# Patient Record
Sex: Male | Born: 1940
Health system: Southern US, Community
[De-identification: ages and names within clinical notes are randomized; demographics above are authoritative.]

## PROBLEM LIST (undated history)

## (undated) DIAGNOSIS — I482 Chronic atrial fibrillation, unspecified: Secondary | ICD-10-CM

## (undated) DIAGNOSIS — K5730 Diverticulosis of large intestine without perforation or abscess without bleeding: Secondary | ICD-10-CM

## (undated) DIAGNOSIS — G4733 Obstructive sleep apnea (adult) (pediatric): Secondary | ICD-10-CM

## (undated) DIAGNOSIS — K635 Polyp of colon: Secondary | ICD-10-CM

## (undated) DIAGNOSIS — G4719 Other hypersomnia: Secondary | ICD-10-CM

## (undated) DIAGNOSIS — I2699 Other pulmonary embolism without acute cor pulmonale: Secondary | ICD-10-CM

## (undated) DIAGNOSIS — E785 Hyperlipidemia, unspecified: Secondary | ICD-10-CM

## (undated) DIAGNOSIS — I472 Ventricular tachycardia, unspecified: Secondary | ICD-10-CM

## (undated) DIAGNOSIS — I340 Nonrheumatic mitral (valve) insufficiency: Secondary | ICD-10-CM

## (undated) DIAGNOSIS — I10 Essential (primary) hypertension: Secondary | ICD-10-CM

## (undated) DIAGNOSIS — N200 Calculus of kidney: Secondary | ICD-10-CM

## (undated) DIAGNOSIS — I509 Heart failure, unspecified: Secondary | ICD-10-CM

## (undated) DIAGNOSIS — I2510 Atherosclerotic heart disease of native coronary artery without angina pectoris: Secondary | ICD-10-CM

## (undated) HISTORY — DX: Chronic atrial fibrillation, unspecified: I48.20

## (undated) HISTORY — DX: Diverticulosis of large intestine without perforation or abscess without bleeding: K57.30

## (undated) HISTORY — DX: Atherosclerotic heart disease of native coronary artery without angina pectoris: I25.10

## (undated) HISTORY — PX: KNEE ARTHROPLASTY: SHX992

## (undated) HISTORY — DX: Polyp of colon: K63.5

## (undated) HISTORY — DX: Calculus of kidney: N20.0

## (undated) HISTORY — DX: Ventricular tachycardia, unspecified: I47.20

## (undated) HISTORY — PX: BACK SURGERY: SHX140

## (undated) HISTORY — DX: Nonrheumatic mitral (valve) insufficiency: I34.0

## (undated) HISTORY — DX: Hyperlipidemia, unspecified: E78.5

## (undated) HISTORY — DX: Other hypersomnia: G47.19

## (undated) HISTORY — PX: CHOLECYSTECTOMY: SHX55

## (undated) HISTORY — DX: Essential (primary) hypertension: I10

## (undated) HISTORY — DX: Obstructive sleep apnea (adult) (pediatric): G47.33

## (undated) HISTORY — DX: Ventricular tachycardia: I47.2

## (undated) HISTORY — PX: BASAL CELL CARCINOMA EXCISION: SHX1214

## (undated) HISTORY — DX: Heart failure, unspecified: I50.9

## (undated) HISTORY — PX: CARDIAC DEFIBRILLATOR PLACEMENT: SHX171

## (undated) HISTORY — DX: Other pulmonary embolism without acute cor pulmonale: I26.99

---

## 2000-06-20 ENCOUNTER — Encounter: Payer: Self-pay | Admitting: Emergency Medicine

## 2000-06-20 ENCOUNTER — Inpatient Hospital Stay (HOSPITAL_COMMUNITY): Admission: EM | Admit: 2000-06-20 | Discharge: 2000-06-28 | Payer: Self-pay | Admitting: Emergency Medicine

## 2000-06-22 ENCOUNTER — Encounter: Payer: Self-pay | Admitting: Cardiovascular Disease

## 2000-06-28 ENCOUNTER — Encounter: Payer: Self-pay | Admitting: Cardiology

## 2001-04-09 ENCOUNTER — Emergency Department (HOSPITAL_COMMUNITY): Admission: EM | Admit: 2001-04-09 | Discharge: 2001-04-09 | Payer: Self-pay | Admitting: Emergency Medicine

## 2001-05-19 ENCOUNTER — Emergency Department (HOSPITAL_COMMUNITY): Admission: EM | Admit: 2001-05-19 | Discharge: 2001-05-19 | Payer: Self-pay | Admitting: Emergency Medicine

## 2001-05-20 ENCOUNTER — Emergency Department (HOSPITAL_COMMUNITY): Admission: EM | Admit: 2001-05-20 | Discharge: 2001-05-20 | Payer: Self-pay | Admitting: Emergency Medicine

## 2002-02-05 ENCOUNTER — Emergency Department (HOSPITAL_COMMUNITY): Admission: EM | Admit: 2002-02-05 | Discharge: 2002-02-05 | Payer: Self-pay | Admitting: Emergency Medicine

## 2002-03-02 ENCOUNTER — Encounter: Payer: Self-pay | Admitting: Internal Medicine

## 2002-03-03 ENCOUNTER — Inpatient Hospital Stay (HOSPITAL_COMMUNITY): Admission: AD | Admit: 2002-03-03 | Discharge: 2002-03-07 | Payer: Self-pay | Admitting: Internal Medicine

## 2002-04-04 ENCOUNTER — Encounter: Payer: Self-pay | Admitting: Emergency Medicine

## 2002-04-04 ENCOUNTER — Emergency Department (HOSPITAL_COMMUNITY): Admission: EM | Admit: 2002-04-04 | Discharge: 2002-04-04 | Payer: Self-pay

## 2002-11-18 ENCOUNTER — Emergency Department (HOSPITAL_COMMUNITY): Admission: EM | Admit: 2002-11-18 | Discharge: 2002-11-19 | Payer: Self-pay

## 2003-03-28 ENCOUNTER — Ambulatory Visit (HOSPITAL_COMMUNITY): Admission: RE | Admit: 2003-03-28 | Discharge: 2003-03-28 | Payer: Self-pay | Admitting: Gastroenterology

## 2003-03-28 ENCOUNTER — Encounter (INDEPENDENT_AMBULATORY_CARE_PROVIDER_SITE_OTHER): Payer: Self-pay

## 2003-09-18 ENCOUNTER — Encounter: Payer: Self-pay | Admitting: Internal Medicine

## 2003-09-18 ENCOUNTER — Encounter: Admission: RE | Admit: 2003-09-18 | Discharge: 2003-09-18 | Payer: Self-pay | Admitting: Internal Medicine

## 2003-11-11 ENCOUNTER — Encounter: Payer: Self-pay | Admitting: Internal Medicine

## 2004-10-16 ENCOUNTER — Ambulatory Visit: Payer: Self-pay | Admitting: Cardiology

## 2004-11-03 ENCOUNTER — Ambulatory Visit: Payer: Self-pay | Admitting: Internal Medicine

## 2004-11-13 ENCOUNTER — Ambulatory Visit: Payer: Self-pay | Admitting: Internal Medicine

## 2004-11-13 ENCOUNTER — Ambulatory Visit: Payer: Self-pay | Admitting: Cardiology

## 2004-11-27 ENCOUNTER — Ambulatory Visit: Payer: Self-pay | Admitting: Cardiology

## 2004-12-25 ENCOUNTER — Ambulatory Visit: Payer: Self-pay | Admitting: Cardiovascular Disease

## 2005-01-22 ENCOUNTER — Ambulatory Visit: Payer: Self-pay | Admitting: Internal Medicine

## 2005-01-25 ENCOUNTER — Ambulatory Visit: Payer: Self-pay

## 2005-02-11 ENCOUNTER — Ambulatory Visit: Payer: Self-pay | Admitting: Internal Medicine

## 2005-03-01 ENCOUNTER — Ambulatory Visit: Payer: Self-pay | Admitting: Cardiology

## 2005-03-29 ENCOUNTER — Ambulatory Visit: Payer: Self-pay | Admitting: Cardiology

## 2005-04-22 ENCOUNTER — Ambulatory Visit: Payer: Self-pay | Admitting: Cardiology

## 2005-05-04 ENCOUNTER — Ambulatory Visit: Payer: Self-pay | Admitting: Internal Medicine

## 2005-05-11 ENCOUNTER — Ambulatory Visit: Payer: Self-pay | Admitting: Internal Medicine

## 2005-05-18 ENCOUNTER — Ambulatory Visit: Payer: Self-pay | Admitting: Internal Medicine

## 2005-05-20 ENCOUNTER — Ambulatory Visit: Payer: Self-pay | Admitting: Internal Medicine

## 2005-05-20 ENCOUNTER — Ambulatory Visit: Payer: Self-pay

## 2005-06-14 ENCOUNTER — Ambulatory Visit: Payer: Self-pay | Admitting: Internal Medicine

## 2005-06-14 ENCOUNTER — Encounter: Admission: RE | Admit: 2005-06-14 | Discharge: 2005-09-12 | Payer: Self-pay | Admitting: Internal Medicine

## 2005-06-16 ENCOUNTER — Ambulatory Visit: Payer: Self-pay | Admitting: Gastroenterology

## 2005-06-17 ENCOUNTER — Ambulatory Visit: Payer: Self-pay | Admitting: Internal Medicine

## 2005-06-21 ENCOUNTER — Ambulatory Visit: Payer: Self-pay | Admitting: Internal Medicine

## 2005-07-15 ENCOUNTER — Ambulatory Visit: Payer: Self-pay | Admitting: Internal Medicine

## 2005-07-19 ENCOUNTER — Ambulatory Visit: Payer: Self-pay | Admitting: Gastroenterology

## 2005-07-27 ENCOUNTER — Ambulatory Visit: Payer: Self-pay | Admitting: Gastroenterology

## 2005-07-27 ENCOUNTER — Encounter: Payer: Self-pay | Admitting: Internal Medicine

## 2005-08-16 ENCOUNTER — Ambulatory Visit: Payer: Self-pay | Admitting: Internal Medicine

## 2005-09-21 ENCOUNTER — Ambulatory Visit: Payer: Self-pay | Admitting: Internal Medicine

## 2005-10-11 ENCOUNTER — Ambulatory Visit: Payer: Self-pay | Admitting: Internal Medicine

## 2005-10-18 ENCOUNTER — Ambulatory Visit: Payer: Self-pay | Admitting: Internal Medicine

## 2005-10-25 ENCOUNTER — Ambulatory Visit: Payer: Self-pay | Admitting: Internal Medicine

## 2005-11-24 ENCOUNTER — Ambulatory Visit: Payer: Self-pay | Admitting: Internal Medicine

## 2005-12-27 ENCOUNTER — Ambulatory Visit: Payer: Self-pay | Admitting: Internal Medicine

## 2006-01-03 ENCOUNTER — Ambulatory Visit: Payer: Self-pay | Admitting: Internal Medicine

## 2006-01-18 ENCOUNTER — Ambulatory Visit: Payer: Self-pay | Admitting: Internal Medicine

## 2006-02-01 ENCOUNTER — Ambulatory Visit: Payer: Self-pay | Admitting: Internal Medicine

## 2006-02-10 ENCOUNTER — Ambulatory Visit: Payer: Self-pay | Admitting: Internal Medicine

## 2006-02-21 ENCOUNTER — Ambulatory Visit: Payer: Self-pay | Admitting: Internal Medicine

## 2006-03-01 ENCOUNTER — Ambulatory Visit: Payer: Self-pay | Admitting: Internal Medicine

## 2006-03-03 ENCOUNTER — Encounter: Payer: Self-pay | Admitting: Internal Medicine

## 2006-03-03 ENCOUNTER — Ambulatory Visit: Payer: Self-pay

## 2006-03-15 ENCOUNTER — Ambulatory Visit: Payer: Self-pay | Admitting: Internal Medicine

## 2006-03-21 ENCOUNTER — Ambulatory Visit: Payer: Self-pay | Admitting: Internal Medicine

## 2006-04-20 ENCOUNTER — Ambulatory Visit: Payer: Self-pay | Admitting: Internal Medicine

## 2006-05-05 ENCOUNTER — Ambulatory Visit: Payer: Self-pay | Admitting: Internal Medicine

## 2006-06-06 ENCOUNTER — Ambulatory Visit: Payer: Self-pay | Admitting: Internal Medicine

## 2006-06-14 ENCOUNTER — Ambulatory Visit: Payer: Self-pay | Admitting: Internal Medicine

## 2006-06-16 ENCOUNTER — Ambulatory Visit: Payer: Self-pay | Admitting: Internal Medicine

## 2006-06-22 ENCOUNTER — Ambulatory Visit: Payer: Self-pay | Admitting: Internal Medicine

## 2006-07-07 ENCOUNTER — Ambulatory Visit: Payer: Self-pay | Admitting: Internal Medicine

## 2006-08-11 ENCOUNTER — Ambulatory Visit: Payer: Self-pay | Admitting: Internal Medicine

## 2006-09-12 ENCOUNTER — Ambulatory Visit: Payer: Self-pay | Admitting: Internal Medicine

## 2006-09-26 ENCOUNTER — Ambulatory Visit: Payer: Self-pay

## 2006-10-10 ENCOUNTER — Ambulatory Visit: Payer: Self-pay | Admitting: Internal Medicine

## 2006-10-10 LAB — CONVERTED CEMR LAB
ALT: 21 units/L (ref 0–40)
AST: 25 units/L (ref 0–37)
Albumin: 3.7 g/dL (ref 3.5–5.2)
Alkaline Phosphatase: 71 units/L (ref 39–117)
BUN: 21 mg/dL (ref 6–23)
Bilirubin, Direct: 0.2 mg/dL (ref 0.0–0.3)
CO2: 30 meq/L (ref 19–32)
Calcium: 9.3 mg/dL (ref 8.4–10.5)
Chloride: 105 meq/L (ref 96–112)
Chol/HDL Ratio, serum: 4.4
Cholesterol: 126 mg/dL (ref 0–200)
Creatinine, Ser: 1 mg/dL (ref 0.4–1.5)
GFR calc non Af Amer: 80 mL/min
Glomerular Filtration Rate, Af Am: 97 mL/min/{1.73_m2}
Glucose, Bld: 98 mg/dL (ref 70–99)
HDL: 28.6 mg/dL — ABNORMAL LOW (ref >39.0)
Hgb A1c MFr Bld: 5.7 % (ref 4.6–6.0)
LDL Cholesterol: 87 mg/dL (ref 0–99)
Potassium: 4.3 meq/L (ref 3.5–5.1)
Sodium: 141 meq/L (ref 135–145)
Total Bilirubin: 0.7 mg/dL (ref 0.3–1.2)
Total Protein: 6.3 g/dL (ref 6.0–8.3)
Triglyceride fasting, serum: 53 mg/dL (ref 0–149)
VLDL: 11 mg/dL (ref 0–40)

## 2006-10-16 DIAGNOSIS — I252 Old myocardial infarction: Secondary | ICD-10-CM | POA: Insufficient documentation

## 2006-10-16 DIAGNOSIS — Z860100 Personal history of colon polyps, unspecified: Secondary | ICD-10-CM | POA: Insufficient documentation

## 2006-10-16 DIAGNOSIS — E785 Hyperlipidemia, unspecified: Secondary | ICD-10-CM

## 2006-10-16 DIAGNOSIS — Z87442 Personal history of urinary calculi: Secondary | ICD-10-CM | POA: Insufficient documentation

## 2006-10-16 DIAGNOSIS — E1169 Type 2 diabetes mellitus with other specified complication: Secondary | ICD-10-CM | POA: Insufficient documentation

## 2006-10-16 DIAGNOSIS — I152 Hypertension secondary to endocrine disorders: Secondary | ICD-10-CM | POA: Insufficient documentation

## 2006-10-16 DIAGNOSIS — I10 Essential (primary) hypertension: Secondary | ICD-10-CM | POA: Insufficient documentation

## 2006-10-16 DIAGNOSIS — I2782 Chronic pulmonary embolism: Secondary | ICD-10-CM | POA: Insufficient documentation

## 2006-10-16 DIAGNOSIS — I2510 Atherosclerotic heart disease of native coronary artery without angina pectoris: Secondary | ICD-10-CM | POA: Insufficient documentation

## 2006-10-16 DIAGNOSIS — K5730 Diverticulosis of large intestine without perforation or abscess without bleeding: Secondary | ICD-10-CM

## 2006-10-16 DIAGNOSIS — E119 Type 2 diabetes mellitus without complications: Secondary | ICD-10-CM | POA: Insufficient documentation

## 2006-10-16 HISTORY — DX: Diverticulosis of large intestine without perforation or abscess without bleeding: K57.30

## 2006-10-17 ENCOUNTER — Ambulatory Visit: Payer: Self-pay | Admitting: Internal Medicine

## 2006-10-17 LAB — CONVERTED CEMR LAB
Cholesterol, target level: 200 mg/dL
HDL goal, serum: 40 mg/dL
LDL Goal: 70 mg/dL

## 2006-11-16 ENCOUNTER — Ambulatory Visit: Payer: Self-pay | Admitting: Internal Medicine

## 2006-12-20 ENCOUNTER — Ambulatory Visit: Payer: Self-pay | Admitting: Internal Medicine

## 2007-01-09 ENCOUNTER — Ambulatory Visit: Payer: Self-pay | Admitting: Internal Medicine

## 2007-01-20 ENCOUNTER — Ambulatory Visit: Payer: Self-pay | Admitting: Internal Medicine

## 2007-01-26 ENCOUNTER — Ambulatory Visit: Payer: Self-pay | Admitting: Internal Medicine

## 2007-01-27 ENCOUNTER — Ambulatory Visit: Payer: Self-pay | Admitting: Internal Medicine

## 2007-02-06 ENCOUNTER — Ambulatory Visit: Payer: Self-pay | Admitting: Internal Medicine

## 2007-02-06 LAB — CONVERTED CEMR LAB
ALT: 19 units/L (ref 0–40)
AST: 24 units/L (ref 0–37)
Albumin: 3.6 g/dL (ref 3.5–5.2)
Alkaline Phosphatase: 63 units/L (ref 39–117)
BUN: 16 mg/dL (ref 6–23)
Bilirubin, Direct: 0.1 mg/dL (ref 0.0–0.3)
CO2: 32 meq/L (ref 19–32)
Calcium: 9.3 mg/dL (ref 8.4–10.5)
Chloride: 108 meq/L (ref 96–112)
Cholesterol: 122 mg/dL (ref 0–200)
Creatinine, Ser: 0.9 mg/dL (ref 0.4–1.5)
GFR calc Af Amer: 110 mL/min
GFR calc non Af Amer: 91 mL/min
Glucose, Bld: 99 mg/dL (ref 70–99)
HDL: 34.1 mg/dL — ABNORMAL LOW (ref >39.0)
Hgb A1c MFr Bld: 6 % (ref 4.6–6.0)
LDL Cholesterol: 74 mg/dL (ref 0–99)
PSA: 1.41 ng/mL (ref 0.10–4.00)
Potassium: 5.1 meq/L (ref 3.5–5.1)
Sodium: 144 meq/L (ref 135–145)
Total Bilirubin: 0.7 mg/dL (ref 0.3–1.2)
Total CHOL/HDL Ratio: 3.6
Total Protein: 6.6 g/dL (ref 6.0–8.3)
Triglycerides: 72 mg/dL (ref 0–149)
VLDL: 14 mg/dL (ref 0–40)

## 2007-02-13 ENCOUNTER — Ambulatory Visit: Payer: Self-pay | Admitting: Internal Medicine

## 2007-02-22 ENCOUNTER — Ambulatory Visit: Payer: Self-pay | Admitting: Internal Medicine

## 2007-03-24 ENCOUNTER — Ambulatory Visit: Payer: Self-pay | Admitting: Internal Medicine

## 2007-04-24 ENCOUNTER — Ambulatory Visit: Payer: Self-pay | Admitting: Internal Medicine

## 2007-05-09 ENCOUNTER — Ambulatory Visit: Payer: Self-pay | Admitting: Internal Medicine

## 2007-05-22 ENCOUNTER — Ambulatory Visit: Payer: Self-pay | Admitting: Internal Medicine

## 2007-06-19 ENCOUNTER — Ambulatory Visit: Payer: Self-pay | Admitting: Internal Medicine

## 2007-06-19 LAB — CONVERTED CEMR LAB
ALT: 20 units/L (ref 0–53)
AST: 22 units/L (ref 0–37)
Albumin: 3.8 g/dL (ref 3.5–5.2)
Alkaline Phosphatase: 57 units/L (ref 39–117)
BUN: 19 mg/dL (ref 6–23)
Bilirubin, Direct: 0.1 mg/dL (ref 0.0–0.3)
CO2: 29 meq/L (ref 19–32)
Calcium: 9.1 mg/dL (ref 8.4–10.5)
Chloride: 105 meq/L (ref 96–112)
Cholesterol: 112 mg/dL (ref 0–200)
Creatinine, Ser: 0.8 mg/dL (ref 0.4–1.5)
Digitoxin Lvl: 0.4 ng/mL — ABNORMAL LOW (ref 0.8–2.0)
GFR calc Af Amer: 126 mL/min
GFR calc non Af Amer: 104 mL/min
Glucose, Bld: 91 mg/dL (ref 70–99)
HDL: 31.6 mg/dL — ABNORMAL LOW (ref >39.0)
Hgb A1c MFr Bld: 6.1 % — ABNORMAL HIGH (ref 4.6–6.0)
LDL Cholesterol: 69 mg/dL (ref 0–99)
Potassium: 4.5 meq/L (ref 3.5–5.1)
Sodium: 141 meq/L (ref 135–145)
Total Bilirubin: 1 mg/dL (ref 0.3–1.2)
Total CHOL/HDL Ratio: 3.5
Total Protein: 6.7 g/dL (ref 6.0–8.3)
Triglycerides: 56 mg/dL (ref 0–149)
VLDL: 11 mg/dL (ref 0–40)

## 2007-07-05 ENCOUNTER — Ambulatory Visit: Payer: Self-pay | Admitting: Internal Medicine

## 2007-07-05 DIAGNOSIS — Z85828 Personal history of other malignant neoplasm of skin: Secondary | ICD-10-CM | POA: Insufficient documentation

## 2007-07-17 ENCOUNTER — Telehealth: Payer: Self-pay | Admitting: Internal Medicine

## 2007-07-27 ENCOUNTER — Ambulatory Visit: Payer: Self-pay | Admitting: Internal Medicine

## 2007-07-27 LAB — CONVERTED CEMR LAB
INR: 3
Prothrombin Time: 20.9 s

## 2007-08-25 ENCOUNTER — Ambulatory Visit: Payer: Self-pay | Admitting: Internal Medicine

## 2007-08-25 LAB — CONVERTED CEMR LAB
INR: 2
Prothrombin Time: 17.5 s

## 2007-08-29 ENCOUNTER — Ambulatory Visit: Payer: Self-pay | Admitting: Internal Medicine

## 2007-09-22 ENCOUNTER — Telehealth: Payer: Self-pay | Admitting: Internal Medicine

## 2007-09-25 ENCOUNTER — Ambulatory Visit: Payer: Self-pay | Admitting: Internal Medicine

## 2007-10-25 ENCOUNTER — Ambulatory Visit: Payer: Self-pay | Admitting: Internal Medicine

## 2007-10-25 LAB — CONVERTED CEMR LAB
ALT: 30 units/L (ref 0–53)
AST: 25 units/L (ref 0–37)
Albumin: 3.7 g/dL (ref 3.5–5.2)
Alkaline Phosphatase: 72 units/L (ref 39–117)
Bilirubin, Direct: 0.2 mg/dL (ref 0.0–0.3)
Cholesterol: 113 mg/dL (ref 0–200)
HDL: 26.2 mg/dL — ABNORMAL LOW (ref >39.0)
Hgb A1c MFr Bld: 6.1 % — ABNORMAL HIGH (ref 4.6–6.0)
LDL Cholesterol: 78 mg/dL (ref 0–99)
Total Bilirubin: 0.8 mg/dL (ref 0.3–1.2)
Total CHOL/HDL Ratio: 4.3
Total Protein: 6.6 g/dL (ref 6.0–8.3)
Triglycerides: 44 mg/dL (ref 0–149)
VLDL: 9 mg/dL (ref 0–40)

## 2007-11-01 ENCOUNTER — Ambulatory Visit: Payer: Self-pay | Admitting: Internal Medicine

## 2007-11-08 ENCOUNTER — Ambulatory Visit: Payer: Self-pay

## 2007-11-22 ENCOUNTER — Ambulatory Visit: Payer: Self-pay | Admitting: Internal Medicine

## 2007-11-22 LAB — CONVERTED CEMR LAB
INR: 3.5
Prothrombin Time: 22.6 s

## 2007-12-20 ENCOUNTER — Ambulatory Visit: Payer: Self-pay | Admitting: Internal Medicine

## 2007-12-20 LAB — CONVERTED CEMR LAB: INR: 2.8

## 2008-01-22 ENCOUNTER — Ambulatory Visit: Payer: Self-pay | Admitting: Internal Medicine

## 2008-01-22 LAB — CONVERTED CEMR LAB
INR: 4.1
Prothrombin Time: 24.6 s

## 2008-02-06 ENCOUNTER — Ambulatory Visit: Payer: Self-pay | Admitting: Internal Medicine

## 2008-02-08 ENCOUNTER — Telehealth: Payer: Self-pay | Admitting: Internal Medicine

## 2008-02-13 ENCOUNTER — Ambulatory Visit: Payer: Self-pay | Admitting: Internal Medicine

## 2008-02-20 ENCOUNTER — Ambulatory Visit: Payer: Self-pay | Admitting: Internal Medicine

## 2008-02-20 LAB — CONVERTED CEMR LAB
ALT: 23 units/L (ref 0–53)
Alkaline Phosphatase: 61 units/L (ref 39–117)
BUN: 16 mg/dL (ref 6–23)
CO2: 31 meq/L (ref 19–32)
Calcium: 9 mg/dL (ref 8.4–10.5)
Creatinine, Ser: 0.9 mg/dL (ref 0.4–1.5)
GFR calc Af Amer: 109 mL/min
Total Bilirubin: 1 mg/dL (ref 0.3–1.2)
Total Protein: 6.1 g/dL (ref 6.0–8.3)

## 2008-02-27 ENCOUNTER — Ambulatory Visit: Payer: Self-pay | Admitting: Internal Medicine

## 2008-03-08 ENCOUNTER — Ambulatory Visit: Payer: Self-pay | Admitting: Internal Medicine

## 2008-03-08 LAB — CONVERTED CEMR LAB
INR: 1.6
Prothrombin Time: 15.7 s

## 2008-03-26 ENCOUNTER — Ambulatory Visit: Payer: Self-pay | Admitting: Internal Medicine

## 2008-04-05 ENCOUNTER — Ambulatory Visit: Payer: Self-pay | Admitting: Internal Medicine

## 2008-04-05 LAB — CONVERTED CEMR LAB
INR: 1.9
Prothrombin Time: 17.1 s

## 2008-04-19 ENCOUNTER — Ambulatory Visit: Payer: Self-pay | Admitting: Internal Medicine

## 2008-04-19 ENCOUNTER — Encounter: Payer: Self-pay | Admitting: Internal Medicine

## 2008-04-26 ENCOUNTER — Ambulatory Visit: Payer: Self-pay | Admitting: Internal Medicine

## 2008-04-26 ENCOUNTER — Encounter: Payer: Self-pay | Admitting: Internal Medicine

## 2008-05-03 ENCOUNTER — Ambulatory Visit: Payer: Self-pay | Admitting: Internal Medicine

## 2008-05-15 ENCOUNTER — Ambulatory Visit: Payer: Self-pay

## 2008-06-03 ENCOUNTER — Ambulatory Visit: Payer: Self-pay | Admitting: Internal Medicine

## 2008-06-03 LAB — CONVERTED CEMR LAB: Prothrombin Time: 15.9 s

## 2008-06-18 ENCOUNTER — Ambulatory Visit: Payer: Self-pay | Admitting: Internal Medicine

## 2008-06-19 ENCOUNTER — Encounter: Payer: Self-pay | Admitting: Internal Medicine

## 2008-06-20 ENCOUNTER — Telehealth (INDEPENDENT_AMBULATORY_CARE_PROVIDER_SITE_OTHER): Payer: Self-pay | Admitting: *Deleted

## 2008-06-21 ENCOUNTER — Ambulatory Visit: Payer: Self-pay | Admitting: Internal Medicine

## 2008-06-21 LAB — CONVERTED CEMR LAB
BUN: 19 mg/dL (ref 6–23)
Creatinine, Ser: 1 mg/dL (ref 0.4–1.5)

## 2008-06-24 ENCOUNTER — Ambulatory Visit: Payer: Self-pay | Admitting: Cardiovascular Disease

## 2008-06-26 DIAGNOSIS — N200 Calculus of kidney: Secondary | ICD-10-CM | POA: Insufficient documentation

## 2008-06-26 HISTORY — DX: Calculus of kidney: N20.0

## 2008-07-01 ENCOUNTER — Ambulatory Visit: Payer: Self-pay | Admitting: Internal Medicine

## 2008-07-01 LAB — CONVERTED CEMR LAB
INR: 1.1
Prothrombin Time: 13.1 s

## 2008-07-02 ENCOUNTER — Ambulatory Visit: Payer: Self-pay

## 2008-07-11 ENCOUNTER — Ambulatory Visit: Payer: Self-pay | Admitting: Internal Medicine

## 2008-07-17 ENCOUNTER — Ambulatory Visit: Payer: Self-pay | Admitting: Internal Medicine

## 2008-07-17 LAB — CONVERTED CEMR LAB
Basophils Absolute: 0 10*3/uL (ref 0.0–0.1)
Basophils Relative: 0.5 % (ref 0.0–3.0)
Chloride: 106 meq/L (ref 96–112)
Creatinine, Ser: 0.9 mg/dL (ref 0.4–1.5)
Eosinophils Absolute: 0.2 10*3/uL (ref 0.0–0.7)
GFR calc non Af Amer: 90 mL/min
MCHC: 35.6 g/dL (ref 30.0–36.0)
MCV: 99.9 fL (ref 78.0–100.0)
Neutrophils Relative %: 66.6 % (ref 43.0–77.0)
Platelets: 159 10*3/uL (ref 150–400)
RBC: 4.38 M/uL (ref 4.22–5.81)
aPTT: 30.6 s — ABNORMAL HIGH (ref 21.7–29.8)

## 2008-07-22 ENCOUNTER — Ambulatory Visit: Payer: Self-pay | Admitting: Internal Medicine

## 2008-07-22 ENCOUNTER — Ambulatory Visit (HOSPITAL_COMMUNITY): Admission: RE | Admit: 2008-07-22 | Discharge: 2008-07-22 | Payer: Self-pay | Admitting: Internal Medicine

## 2008-07-23 ENCOUNTER — Telehealth: Payer: Self-pay | Admitting: Internal Medicine

## 2008-08-05 ENCOUNTER — Ambulatory Visit: Payer: Self-pay | Admitting: Internal Medicine

## 2008-08-06 ENCOUNTER — Encounter: Payer: Self-pay | Admitting: Internal Medicine

## 2008-08-07 ENCOUNTER — Ambulatory Visit: Payer: Self-pay

## 2008-08-19 ENCOUNTER — Ambulatory Visit: Payer: Self-pay | Admitting: Internal Medicine

## 2008-08-19 LAB — CONVERTED CEMR LAB
ALT: 26 units/L (ref 0–53)
AST: 23 units/L (ref 0–37)
Alkaline Phosphatase: 72 units/L (ref 39–117)
Bilirubin, Direct: 0.1 mg/dL (ref 0.0–0.3)
CO2: 34 meq/L — ABNORMAL HIGH (ref 19–32)
Calcium: 9.2 mg/dL (ref 8.4–10.5)
Chloride: 107 meq/L (ref 96–112)
Glucose, Bld: 100 mg/dL — ABNORMAL HIGH (ref 70–99)
HDL: 32.9 mg/dL — ABNORMAL LOW (ref >39.0)
Potassium: 4.7 meq/L (ref 3.5–5.1)
Sodium: 145 meq/L (ref 135–145)
Total Bilirubin: 0.7 mg/dL (ref 0.3–1.2)
Total CHOL/HDL Ratio: 3.3
Total Protein: 7 g/dL (ref 6.0–8.3)

## 2008-08-23 ENCOUNTER — Ambulatory Visit: Payer: Self-pay | Admitting: Internal Medicine

## 2008-08-26 ENCOUNTER — Encounter: Payer: Self-pay | Admitting: Internal Medicine

## 2008-09-16 ENCOUNTER — Ambulatory Visit: Payer: Self-pay | Admitting: Internal Medicine

## 2008-10-18 ENCOUNTER — Ambulatory Visit: Payer: Self-pay | Admitting: Internal Medicine

## 2008-10-18 LAB — CONVERTED CEMR LAB: INR: 2.3

## 2008-10-28 ENCOUNTER — Telehealth: Payer: Self-pay | Admitting: Internal Medicine

## 2008-11-12 ENCOUNTER — Ambulatory Visit: Payer: Self-pay | Admitting: Internal Medicine

## 2008-11-18 ENCOUNTER — Ambulatory Visit: Payer: Self-pay | Admitting: Internal Medicine

## 2008-11-18 LAB — CONVERTED CEMR LAB
INR: 2.5
Prothrombin Time: 19.2 s

## 2008-12-10 ENCOUNTER — Ambulatory Visit: Payer: Self-pay | Admitting: Internal Medicine

## 2008-12-10 ENCOUNTER — Telehealth: Payer: Self-pay | Admitting: Internal Medicine

## 2008-12-10 LAB — CONVERTED CEMR LAB
AST: 27 units/L (ref 0–37)
Albumin: 3.7 g/dL (ref 3.5–5.2)
Alkaline Phosphatase: 64 units/L (ref 39–117)
BUN: 18 mg/dL (ref 6–23)
Bilirubin, Direct: 0.2 mg/dL (ref 0.0–0.3)
Chloride: 107 meq/L (ref 96–112)
Cholesterol: 108 mg/dL (ref 0–200)
GFR calc Af Amer: 109 mL/min
GFR calc non Af Amer: 90 mL/min
Glucose, Bld: 129 mg/dL — ABNORMAL HIGH (ref 70–99)
INR: 2.4
Potassium: 5 meq/L (ref 3.5–5.1)
Sodium: 146 meq/L — ABNORMAL HIGH (ref 135–145)
Total Protein: 6.8 g/dL (ref 6.0–8.3)
VLDL: 9 mg/dL (ref 0–40)

## 2008-12-17 ENCOUNTER — Ambulatory Visit: Payer: Self-pay | Admitting: Internal Medicine

## 2008-12-17 DIAGNOSIS — N320 Bladder-neck obstruction: Secondary | ICD-10-CM | POA: Insufficient documentation

## 2009-01-07 ENCOUNTER — Encounter: Payer: Self-pay | Admitting: Internal Medicine

## 2009-01-07 ENCOUNTER — Ambulatory Visit: Payer: Self-pay | Admitting: Internal Medicine

## 2009-01-07 LAB — CONVERTED CEMR LAB
INR: 3.3
Prothrombin Time: 21.8 s

## 2009-02-04 ENCOUNTER — Ambulatory Visit: Payer: Self-pay | Admitting: Internal Medicine

## 2009-02-04 LAB — CONVERTED CEMR LAB: INR: 3.4

## 2009-02-10 ENCOUNTER — Ambulatory Visit: Payer: Self-pay

## 2009-02-26 ENCOUNTER — Telehealth: Payer: Self-pay | Admitting: Internal Medicine

## 2009-03-04 ENCOUNTER — Ambulatory Visit: Payer: Self-pay | Admitting: Internal Medicine

## 2009-03-04 LAB — CONVERTED CEMR LAB
INR: 2.2
Prothrombin Time: 18.3 s

## 2009-03-31 ENCOUNTER — Ambulatory Visit: Payer: Self-pay | Admitting: Internal Medicine

## 2009-04-09 ENCOUNTER — Ambulatory Visit: Payer: Self-pay | Admitting: Internal Medicine

## 2009-04-09 LAB — CONVERTED CEMR LAB
Alkaline Phosphatase: 71 units/L (ref 39–117)
BUN: 18 mg/dL (ref 6–23)
Bilirubin, Direct: 0.1 mg/dL (ref 0.0–0.3)
CO2: 30 meq/L (ref 19–32)
Chloride: 111 meq/L (ref 96–112)
Creatinine, Ser: 0.8 mg/dL (ref 0.4–1.5)
Glucose, Bld: 102 mg/dL — ABNORMAL HIGH (ref 70–99)
LDL Cholesterol: 76 mg/dL (ref 0–99)
Potassium: 5 meq/L (ref 3.5–5.1)
Total Bilirubin: 1 mg/dL (ref 0.3–1.2)
VLDL: 10.4 mg/dL (ref 0.0–40.0)

## 2009-04-16 ENCOUNTER — Ambulatory Visit: Payer: Self-pay | Admitting: Internal Medicine

## 2009-05-01 ENCOUNTER — Ambulatory Visit: Payer: Self-pay | Admitting: Internal Medicine

## 2009-05-01 LAB — CONVERTED CEMR LAB: INR: 1.9

## 2009-05-08 ENCOUNTER — Encounter: Payer: Self-pay | Admitting: Internal Medicine

## 2009-05-08 ENCOUNTER — Ambulatory Visit: Payer: Self-pay

## 2009-05-15 ENCOUNTER — Ambulatory Visit: Payer: Self-pay | Admitting: Internal Medicine

## 2009-06-02 ENCOUNTER — Telehealth: Payer: Self-pay | Admitting: Internal Medicine

## 2009-06-03 ENCOUNTER — Ambulatory Visit: Payer: Self-pay | Admitting: Internal Medicine

## 2009-06-12 ENCOUNTER — Ambulatory Visit: Payer: Self-pay | Admitting: Internal Medicine

## 2009-06-12 LAB — CONVERTED CEMR LAB: INR: 2.6

## 2009-06-24 ENCOUNTER — Telehealth: Payer: Self-pay | Admitting: Internal Medicine

## 2009-07-03 ENCOUNTER — Telehealth (INDEPENDENT_AMBULATORY_CARE_PROVIDER_SITE_OTHER): Payer: Self-pay | Admitting: *Deleted

## 2009-07-14 ENCOUNTER — Ambulatory Visit: Payer: Self-pay | Admitting: Internal Medicine

## 2009-07-14 LAB — CONVERTED CEMR LAB: Prothrombin Time: 20.5 s

## 2009-08-07 ENCOUNTER — Encounter: Payer: Self-pay | Admitting: Internal Medicine

## 2009-08-07 ENCOUNTER — Ambulatory Visit: Payer: Self-pay

## 2009-08-13 ENCOUNTER — Ambulatory Visit: Payer: Self-pay | Admitting: Internal Medicine

## 2009-08-13 LAB — CONVERTED CEMR LAB
ALT: 24 units/L (ref 0–53)
AST: 24 units/L (ref 0–37)
BUN: 13 mg/dL (ref 6–23)
Bilirubin, Direct: 0.1 mg/dL (ref 0.0–0.3)
Calcium: 9 mg/dL (ref 8.4–10.5)
GFR calc non Af Amer: 71.2 mL/min (ref >60)
Glucose, Bld: 98 mg/dL (ref 70–99)
HDL: 30.4 mg/dL — ABNORMAL LOW (ref >39.00)
INR: 2.6
Prothrombin Time: 19.6 s
Total Bilirubin: 1.3 mg/dL — ABNORMAL HIGH (ref 0.3–1.2)

## 2009-08-20 ENCOUNTER — Emergency Department (HOSPITAL_COMMUNITY): Admission: EM | Admit: 2009-08-20 | Discharge: 2009-08-20 | Payer: Self-pay | Admitting: Emergency Medicine

## 2009-08-20 ENCOUNTER — Ambulatory Visit: Payer: Self-pay | Admitting: Internal Medicine

## 2009-08-22 ENCOUNTER — Telehealth: Payer: Self-pay | Admitting: Internal Medicine

## 2009-08-28 ENCOUNTER — Encounter: Payer: Self-pay | Admitting: Internal Medicine

## 2009-08-28 ENCOUNTER — Ambulatory Visit: Payer: Self-pay

## 2009-09-10 ENCOUNTER — Ambulatory Visit: Payer: Self-pay | Admitting: Internal Medicine

## 2009-09-23 ENCOUNTER — Ambulatory Visit: Payer: Self-pay | Admitting: Internal Medicine

## 2009-09-23 DIAGNOSIS — I714 Abdominal aortic aneurysm, without rupture, unspecified: Secondary | ICD-10-CM | POA: Insufficient documentation

## 2009-09-25 ENCOUNTER — Telehealth (INDEPENDENT_AMBULATORY_CARE_PROVIDER_SITE_OTHER): Payer: Self-pay | Admitting: *Deleted

## 2009-09-29 ENCOUNTER — Encounter (HOSPITAL_COMMUNITY): Admission: RE | Admit: 2009-09-29 | Discharge: 2009-12-04 | Payer: Self-pay | Admitting: Internal Medicine

## 2009-09-29 ENCOUNTER — Ambulatory Visit: Payer: Self-pay | Admitting: Cardiology

## 2009-09-29 ENCOUNTER — Ambulatory Visit: Payer: Self-pay

## 2009-10-08 ENCOUNTER — Ambulatory Visit: Payer: Self-pay | Admitting: Internal Medicine

## 2009-10-08 LAB — CONVERTED CEMR LAB: INR: 2

## 2009-10-24 ENCOUNTER — Telehealth: Payer: Self-pay | Admitting: Internal Medicine

## 2009-10-27 ENCOUNTER — Encounter: Payer: Self-pay | Admitting: Internal Medicine

## 2009-11-04 ENCOUNTER — Telehealth: Payer: Self-pay | Admitting: Internal Medicine

## 2009-11-04 LAB — CONVERTED CEMR LAB
CO2: 30 meq/L (ref 19–32)
Calcium: 9.3 mg/dL (ref 8.4–10.5)
Creatinine, Ser: 1 mg/dL (ref 0.4–1.5)
Glucose, Bld: 94 mg/dL (ref 70–99)

## 2009-11-07 ENCOUNTER — Ambulatory Visit: Payer: Self-pay | Admitting: Internal Medicine

## 2009-12-09 ENCOUNTER — Ambulatory Visit: Payer: Self-pay | Admitting: Internal Medicine

## 2009-12-12 ENCOUNTER — Ambulatory Visit: Payer: Self-pay | Admitting: Internal Medicine

## 2009-12-12 LAB — CONVERTED CEMR LAB
ALT: 25 units/L (ref 0–53)
AST: 24 units/L (ref 0–37)
Alkaline Phosphatase: 63 units/L (ref 39–117)
Calcium: 9.1 mg/dL (ref 8.4–10.5)
Creatinine, Ser: 0.9 mg/dL (ref 0.4–1.5)
GFR calc non Af Amer: 89.66 mL/min (ref >60)
Glucose, Bld: 107 mg/dL — ABNORMAL HIGH (ref 70–99)
HDL: 31.1 mg/dL — ABNORMAL LOW (ref >39.00)
INR: 1.8
Prothrombin Time: 16.4 s
Sodium: 142 meq/L (ref 135–145)
Total Bilirubin: 1 mg/dL (ref 0.3–1.2)
Triglycerides: 43 mg/dL (ref 0.0–149.0)

## 2009-12-19 ENCOUNTER — Ambulatory Visit: Payer: Self-pay | Admitting: Internal Medicine

## 2010-01-13 ENCOUNTER — Ambulatory Visit: Payer: Self-pay | Admitting: Internal Medicine

## 2010-01-13 LAB — CONVERTED CEMR LAB: INR: 2.3

## 2010-01-14 ENCOUNTER — Telehealth: Payer: Self-pay | Admitting: Internal Medicine

## 2010-02-10 ENCOUNTER — Ambulatory Visit: Payer: Self-pay | Admitting: Internal Medicine

## 2010-03-10 ENCOUNTER — Ambulatory Visit: Payer: Self-pay | Admitting: Internal Medicine

## 2010-03-10 DIAGNOSIS — C44201 Unspecified malignant neoplasm of skin of unspecified ear and external auricular canal: Secondary | ICD-10-CM | POA: Insufficient documentation

## 2010-03-10 LAB — CONVERTED CEMR LAB: INR: 2.7

## 2010-03-11 ENCOUNTER — Encounter: Payer: Self-pay | Admitting: Internal Medicine

## 2010-03-11 ENCOUNTER — Ambulatory Visit: Payer: Self-pay

## 2010-04-06 ENCOUNTER — Ambulatory Visit: Payer: Self-pay | Admitting: Internal Medicine

## 2010-04-06 LAB — CONVERTED CEMR LAB
INR: 2.3
Prothrombin Time: 18.7 s

## 2010-05-07 ENCOUNTER — Ambulatory Visit: Payer: Self-pay | Admitting: Internal Medicine

## 2010-05-07 LAB — CONVERTED CEMR LAB: INR: 2.7

## 2010-06-09 ENCOUNTER — Ambulatory Visit: Payer: Self-pay | Admitting: Internal Medicine

## 2010-06-09 LAB — CONVERTED CEMR LAB
ALT: 33 units/L (ref 0–53)
AST: 29 units/L (ref 0–37)
Albumin: 4 g/dL (ref 3.5–5.2)
Alkaline Phosphatase: 70 units/L (ref 39–117)
Bilirubin, Direct: 0.2 mg/dL (ref 0.0–0.3)
CO2: 33 meq/L — ABNORMAL HIGH (ref 19–32)
Chloride: 105 meq/L (ref 96–112)
Glucose, Bld: 101 mg/dL — ABNORMAL HIGH (ref 70–99)
HDL: 30.4 mg/dL — ABNORMAL LOW (ref >39.00)
INR: 1.9
Potassium: 4.5 meq/L (ref 3.5–5.1)
Sodium: 141 meq/L (ref 135–145)
Total CHOL/HDL Ratio: 4
Total Protein: 6.8 g/dL (ref 6.0–8.3)

## 2010-06-10 ENCOUNTER — Ambulatory Visit: Payer: Self-pay | Admitting: Internal Medicine

## 2010-06-16 ENCOUNTER — Ambulatory Visit: Payer: Self-pay | Admitting: Internal Medicine

## 2010-07-09 ENCOUNTER — Ambulatory Visit: Payer: Self-pay | Admitting: Internal Medicine

## 2010-08-07 ENCOUNTER — Ambulatory Visit: Payer: Self-pay | Admitting: Internal Medicine

## 2010-09-04 ENCOUNTER — Ambulatory Visit: Payer: Self-pay | Admitting: Internal Medicine

## 2010-09-09 ENCOUNTER — Ambulatory Visit: Payer: Self-pay

## 2010-09-09 ENCOUNTER — Encounter: Payer: Self-pay | Admitting: Internal Medicine

## 2010-10-02 ENCOUNTER — Ambulatory Visit: Payer: Self-pay | Admitting: Internal Medicine

## 2010-10-28 ENCOUNTER — Ambulatory Visit: Payer: Self-pay | Admitting: Internal Medicine

## 2010-10-28 LAB — CONVERTED CEMR LAB: INR: 2.2

## 2010-11-25 ENCOUNTER — Ambulatory Visit: Payer: Self-pay | Admitting: Internal Medicine

## 2010-11-25 LAB — CONVERTED CEMR LAB: INR: 2.3

## 2010-12-08 ENCOUNTER — Ambulatory Visit
Admission: RE | Admit: 2010-12-08 | Discharge: 2010-12-08 | Payer: Self-pay | Source: Home / Self Care | Attending: Internal Medicine | Admitting: Internal Medicine

## 2010-12-08 LAB — CONVERTED CEMR LAB
AST: 23 units/L (ref 0–37)
Albumin: 3.6 g/dL (ref 3.5–5.2)
BUN: 18 mg/dL (ref 6–23)
Calcium: 9.1 mg/dL (ref 8.4–10.5)
Cholesterol: 114 mg/dL (ref 0–200)
Creatinine, Ser: 0.8 mg/dL (ref 0.4–1.5)
GFR calc non Af Amer: 96.8 mL/min (ref >60.00)
Glucose, Bld: 106 mg/dL — ABNORMAL HIGH (ref 70–99)
HDL: 27.1 mg/dL — ABNORMAL LOW (ref >39.00)
Hgb A1c MFr Bld: 6.7 % — ABNORMAL HIGH (ref 4.6–6.5)
Triglycerides: 41 mg/dL (ref 0.0–149.0)
VLDL: 8.2 mg/dL (ref 0.0–40.0)

## 2010-12-16 ENCOUNTER — Ambulatory Visit
Admission: RE | Admit: 2010-12-16 | Discharge: 2010-12-16 | Payer: Self-pay | Source: Home / Self Care | Attending: Internal Medicine | Admitting: Internal Medicine

## 2010-12-23 ENCOUNTER — Ambulatory Visit: Admit: 2010-12-23 | Payer: Self-pay | Admitting: Internal Medicine

## 2011-01-05 NOTE — Cardiovascular Report (Signed)
Summary: Office Visit   Office Visit   Imported By: Roderic Ovens 12/09/2009 15:16:33  _____________________________________________________________________  External Attachment:    Type:   Image     Comment:   External Document

## 2011-01-05 NOTE — Assessment & Plan Note (Signed)
Summary: PT // RS   Nurse Visit   Allergies: 1)  Penicillin G Potassium (Penicillin G Potassium) 2)  Sulfamethoxazole (Sulfamethoxazole) Laboratory Results   Blood Tests     PT: 18.5 s   (Normal Range: 10.6-13.4)  INR: 2.3   (Normal Range: 0.88-1.12   Therap INR: 2.0-3.5) Comments: Rita Ohara  January 13, 2010 9:43 AM     Orders Added: 1)  Est. Patient Level I [99211] 2)  Protime [[id number]]   ANTICOAGULATION RECORD PREVIOUS REGIMEN & LAB RESULTS Anticoagulation Diagnosis:  v58.83,v58.61,415.19 on  07/27/2007 Previous INR Goal Range:  2.0-3.0 on  05/03/2008 Previous INR:  1.8 on  12/12/2009 Previous Coumadin Dose(mg):  2.5mg  on thur. 5mg  other days on  11/18/2008 Previous Regimen:  same on  10/08/2009 Previous Coagulation Comments:  By Dr. Kirtland Bouchard on  07/01/2008  NEW REGIMEN & LAB RESULTS Current INR: 2.3 Regimen: same  Repeat testing in: 1 month  Anticoagulation Visit Questionnaire Coumadin dose missed/changed:  No Abnormal Bleeding Symptoms:  No  Any diet changes including alcohol intake, vegetables or greens since the last visit:  No Any illnesses or hospitalizations since the last visit:  No Any signs of clotting since the last visit (including chest discomfort, dizziness, shortness of breath, arm tingling, slurred speech, swelling or redness in leg):  No  MEDICATIONS ADPRIN B 325 MG  TABS (ASPIRIN BUF(CACARB-MGCARB-MGO)) one daily COREG 25 MG TABS (CARVEDILOL) one by mouth two times a day WARFARIN SODIUM 5 MG  TABS (WARFARIN SODIUM) Take daily as directed ENALAPRIL MALEATE 10 MG TABS (ENALAPRIL MALEATE) one two times a day LANOXIN 0.25 MG TABS (DIGOXIN) one daily [BMN] OMEGA-3 1000 MG CAPS (OMEGA-3 FATTY ACIDS) Take once a day ALBUTEROL 90 MCG/ACT AERS (ALBUTEROL) 2 puffs q 4 hours as needed LIPITOR 80 MG TABS (ATORVASTATIN CALCIUM) Take one tablet by mouth daily  as directed NASONEX 50 MCG/ACT SUSP (MOMETASONE FUROATE) 1-2 each nostril qd

## 2011-01-05 NOTE — Assessment & Plan Note (Signed)
Summary: PT/NJR   Nurse Visit   Allergies: 1)  Penicillin G Potassium (Penicillin G Potassium) 2)  Sulfamethoxazole (Sulfamethoxazole) Laboratory Results   Blood Tests   Date/Time Received: February 10, 2010 11:05 AM  Date/Time Reported: February 10, 2010 11:05 AM   PT: 17.1 s   (Normal Range: 10.6-13.4)  INR: 1.9   (Normal Range: 0.88-1.12   Therap INR: 2.0-3.5) Comments: Wynona Canes, CMA  February 10, 2010 11:05 AM     Orders Added: 1)  Est. Patient Level I [99211] 2)  Protime [[id number]]  Laboratory Results   Blood Tests     PT: 17.1 s   (Normal Range: 10.6-13.4)  INR: 1.9   (Normal Range: 0.88-1.12   Therap INR: 2.0-3.5) Comments: Wynona Canes, CMA  February 10, 2010 11:05 AM       ANTICOAGULATION RECORD PREVIOUS REGIMEN & LAB RESULTS Anticoagulation Diagnosis:  v58.83,v58.61,415.19 on  07/27/2007 Previous INR Goal Range:  2.0-3.0 on  05/03/2008 Previous INR:  2.3 on  01/13/2010 Previous Coumadin Dose(mg):  2.5mg  on thur. 5mg  other days on  11/18/2008 Previous Regimen:  same on  01/13/2010 Previous Coagulation Comments:  By Dr. Kirtland Bouchard on  07/01/2008  NEW REGIMEN & LAB RESULTS Current INR: 1.9 Current Coumadin Dose(mg): 2.5mg  on thu & sun 5mg  other days Regimen: same  (no change)       Repeat testing in: 4 weeks MEDICATIONS ADPRIN B 325 MG  TABS (ASPIRIN BUF(CACARB-MGCARB-MGO)) one daily COREG 25 MG TABS (CARVEDILOL) one by mouth two times a day WARFARIN SODIUM 5 MG  TABS (WARFARIN SODIUM) Take daily as directed ENALAPRIL MALEATE 10 MG TABS (ENALAPRIL MALEATE) one two times a day LANOXIN 0.25 MG TABS (DIGOXIN) one daily [BMN] OMEGA-3 1000 MG CAPS (OMEGA-3 FATTY ACIDS) Take once a day ALBUTEROL 90 MCG/ACT AERS (ALBUTEROL) 2 puffs q 4 hours as needed LIPITOR 80 MG TABS (ATORVASTATIN CALCIUM) Take one tablet by mouth daily  as directed FLUTICASONE PROPIONATE 50 MCG/ACT  SUSP (FLUTICASONE PROPIONATE) 2 sprays each nostril once daily   Anticoagulation Visit  Questionnaire      Coumadin dose missed/changed:  No      Abnormal Bleeding Symptoms:  No   Any diet changes including alcohol intake, vegetables or greens since the last visit:  No Any illnesses or hospitalizations since the last visit:  No Any signs of clotting since the last visit (including chest discomfort, dizziness, shortness of breath, arm tingling, slurred speech, swelling or redness in leg):  No

## 2011-01-05 NOTE — Assessment & Plan Note (Signed)
Summary: 4 month rov/njr   Vital Signs:  Patient profile:   70 year old male Weight:      205 pounds Temp:     97.7 degrees F Pulse rate:   54 / minute Resp:     12 per minute BP sitting:   110 / 64  (left arm)  Vitals Entered By: Gladis Riffle, RN (December 19, 2009 8:56 AM)   History of Present Illness:  Follow-Up Visit      This is a 70 year old man who presents for Follow-up visit.  The patient denies chest pain, palpitations, dizziness, syncope, edema, SOB, DOE, PND, and orthopnea.  Since the last visit the patient notes no new problems or concerns.  The patient reports taking meds as prescribed.  When questioned about possible medication side effects, the patient notes none.    All other systems reviewed and were negative except chronic sinus drainage  Preventive Screening-Counseling & Management  Alcohol-Tobacco     Smoking Status: current     Packs/Day: 0.5  Current Problems (verified): 1)  Implantable Defibrillatormdt  (ICD-V45.02) 2)  Ventricular Tachycardia Polymorphic  (ICD-427.1) 3)  Cardiomyopathy, Ischemic  (ICD-414.8) 4)  Atrial Fibrillation-permanent  (ICD-427.31) 5)  Coronary Artery Disease  (ICD-414.00) 6)  Bladder Outlet Obstruction  (ICD-596.0) 7)  Pseudogout  (ICD-275.49) 8)  Nephrolithiasis  (ICD-592.0) 9)  Hematuria  (ICD-599.70) 10)  Encounter For Therapeutic Drug Monitoring  (ICD-V58.83) 11)  Coumadin Therapy  (ICD-V58.61) 12)  Carcinoma, Basal Cell  (ICD-173.9) 13)  Nephrolithiasis, Hx of  (ICD-V13.01) 14)  Myocardial Infarction, Hx of  (ICD-412) 15)  Hypertension  (ICD-401.9) 16)  Hyperlipidemia  (ICD-272.4) 17)  Diverticulosis, Colon  (ICD-562.10) 18)  Diabetes Mellitus, Type II  (ICD-250.00) 19)  Congestive Heart Failure  (ICD-428.0) 20)  Colonic Polyps, Hx of  (ICD-V12.72) 21)  Pulmonary Embolism, Hx of  (ICD-V12.51)  Current Medications (verified): 1)  Adprin B 325 Mg  Tabs (Aspirin Buf(Cacarb-Mgcarb-Mgo)) .... One Daily 2)  Coreg 25 Mg  Tabs (Carvedilol) .... One By Mouth Two Times A Day 3)  Warfarin Sodium 5 Mg  Tabs (Warfarin Sodium) .... Take Daily As Directed 4)  Enalapril Maleate 10 Mg Tabs (Enalapril Maleate) .... One Two Times A Day 5)  Lanoxin 0.25 Mg Tabs (Digoxin) .... One Daily 6)  Omega-3 1000 Mg Caps (Omega-3 Fatty Acids) .... Take Once A Day 7)  Albuterol 90 Mcg/act Aers (Albuterol) .... 2 Puffs Q 4 Hours As Needed 8)  Lipitor 80 Mg Tabs (Atorvastatin Calcium) .... Take One Tablet By Mouth Daily  As Directed  Allergies: 1)  Penicillin G Potassium (Penicillin G Potassium) 2)  Sulfamethoxazole (Sulfamethoxazole)  Comments:  Nurse/Medical Assistant: 4 month rov, labs done  The patient's medications and allergies were reviewed with the patient and were updated in the Medication and Allergy Lists. Gladis Riffle, RN (December 19, 2009 8:57 AM)  Past History:  Past Medical History: Last updated: 09/22/2009 Pulmonary embolism, hx of--after icd placed 1993 Colonic polyps, hx of Congestive heart failure Coronary artery disease Diabetes mellitus, type II 2006 Diverticulosis, colon Hyperlipidemia Hypertension Myocardial infarction, hx of Nephrolithiasis, hx of VTACH Ef 30% (LV dysfunction) fam hx colon ca [employer] [id number]   Past Surgical History: Last updated: 09/22/2009 back surgery AICD placement, multiple--[employer] [id number] Back Surgery Basal Cell CA nose Knee Arthroscopy  Social History: Last updated: 07/05/2007 Married Regular exercise-yes  Risk Factors: Exercise: yes (07/05/2007)  Risk Factors: Smoking Status: current (12/19/2009) Packs/Day: 0.5 (12/19/2009)  Social History: Packs/Day:  0.5  Review of Systems       All other systems reviewed and were negative   Physical Exam  General:  Well-developed,well-nourished,in no acute distress; alert,appropriate and cooperative throughout examination Head:  normocephalic and atraumatic.   Ears:  R ear normal and L ear  normal.   Neck:  No deformities, masses, or tenderness noted. Chest Wall:  No deformities, masses, tenderness or gynecomastia noted. Lungs:  normal respiratory effort and no intercostal retractions.   Heart:  normal rate and regular rhythm.   Abdomen:  soft and non-tender.    Diabetes Management Exam:    Eye Exam:       Eye Exam done elsewhere          Date: 12/06/2009          Results: normal          Done by: ophthal   Impression & Recommendations:  Problem # 1:  CARDIOMYOPATHY, ISCHEMIC (ICD-414.8) clinically doing well  His updated medication list for this problem includes:    Adprin B 325 Mg Tabs (Aspirin buf(cacarb-mgcarb-mgo)) ..... One daily    Coreg 25 Mg Tabs (Carvedilol) ..... One by mouth two times a day    Enalapril Maleate 10 Mg Tabs (Enalapril maleate) ..... One two times a day  Labs Reviewed: Chol: 110 (12/12/2009)   HDL: 31.10 (12/12/2009)   LDL: 70 (12/12/2009)   TG: 43.0 (12/12/2009)  Lipid Goals: Chol Goal: 200 (10/17/2006)   HDL Goal: 40 (10/17/2006)   LDL Goal: 70 (10/17/2006)   TG Goal: 150 (10/17/2006)  Problem # 2:  CORONARY ARTERY DISEASE (ICD-414.00) no sxs His updated medication list for this problem includes:    Adprin B 325 Mg Tabs (Aspirin buf(cacarb-mgcarb-mgo)) ..... One daily    Coreg 25 Mg Tabs (Carvedilol) ..... One by mouth two times a day    Enalapril Maleate 10 Mg Tabs (Enalapril maleate) ..... One two times a day  Labs Reviewed: Chol: 110 (12/12/2009)   HDL: 31.10 (12/12/2009)   LDL: 70 (12/12/2009)   TG: 43.0 (12/12/2009)  Lipid Goals: Chol Goal: 200 (10/17/2006)   HDL Goal: 40 (10/17/2006)   LDL Goal: 70 (10/17/2006)   TG Goal: 150 (10/17/2006)  Problem # 3:  HYPERTENSION (ICD-401.9)  His updated medication list for this problem includes:    Coreg 25 Mg Tabs (Carvedilol) ..... One by mouth two times a day    Enalapril Maleate 10 Mg Tabs (Enalapril maleate) ..... One two times a day  BP today: 110/64 Prior BP: 102/54  (12/09/2009)  Prior 10 Yr Risk Heart Disease: N/A (10/17/2006)  Labs Reviewed: K+: 4.5 (12/12/2009) Creat: : 0.9 (12/12/2009)   Chol: 110 (12/12/2009)   HDL: 31.10 (12/12/2009)   LDL: 70 (12/12/2009)   TG: 43.0 (12/12/2009)  Problem # 4:  HYPERLIPIDEMIA (ICD-272.4)  His updated medication list for this problem includes:    Lipitor 80 Mg Tabs (Atorvastatin calcium) .Marland Kitchen... Take one tablet by mouth daily  as directed  Labs Reviewed: SGOT: 24 (12/12/2009)   SGPT: 25 (12/12/2009)  Lipid Goals: Chol Goal: 200 (10/17/2006)   HDL Goal: 40 (10/17/2006)   LDL Goal: 70 (10/17/2006)   TG Goal: 150 (10/17/2006)  Prior 10 Yr Risk Heart Disease: N/A (10/17/2006)   HDL:31.10 (12/12/2009), 30.40 (08/13/2009)  LDL:70 (12/12/2009), 70 (08/13/2009)  Chol:110 (12/12/2009), 113 (08/13/2009)  Trig:43.0 (12/12/2009), 64.0 (08/13/2009)  Problem # 5:  ATRIAL FIBRILLATION-PERMANENT (ICD-427.31) discussed pradaxa His updated medication list for this problem includes:    Adprin B 325 Mg Tabs (  Aspirin buf(cacarb-mgcarb-mgo)) ..... One daily    Coreg 25 Mg Tabs (Carvedilol) ..... One by mouth two times a day    Warfarin Sodium 5 Mg Tabs (Warfarin sodium) .Marland Kitchen... Take daily as directed    Lanoxin 0.25 Mg Tabs (Digoxin) ..... One daily  Complete Medication List: 1)  Adprin B 325 Mg Tabs (Aspirin buf(cacarb-mgcarb-mgo)) .... One daily 2)  Coreg 25 Mg Tabs (Carvedilol) .... One by mouth two times a day 3)  Warfarin Sodium 5 Mg Tabs (Warfarin sodium) .... Take daily as directed 4)  Enalapril Maleate 10 Mg Tabs (Enalapril maleate) .... One two times a day 5)  Lanoxin 0.25 Mg Tabs (Digoxin) .... One daily 6)  Omega-3 1000 Mg Caps (Omega-3 fatty acids) .... Take once a day 7)  Albuterol 90 Mcg/act Aers (Albuterol) .... 2 puffs q 4 hours as needed 8)  Lipitor 80 Mg Tabs (Atorvastatin calcium) .... Take one tablet by mouth daily  as directed 9)  Nasonex 50 Mcg/act Susp (Mometasone furoate) .Marland Kitchen.. 1-2 each nostril  qd  Patient Instructions: 1)  Please schedule a follow-up appointment in 6 months. 2)  labs one week prior to visit 3)  lipids---272.4 4)  lfts-995.2 5)  bmet-995.2 6)  A1C-250.02 7)

## 2011-01-05 NOTE — Assessment & Plan Note (Signed)
Summary: pt//ccm   Nurse Visit   Allergies: 1)  Penicillin G Potassium (Penicillin G Potassium) 2)  Sulfamethoxazole (Sulfamethoxazole) Laboratory Results   Blood Tests      INR: 2.3   (Normal Range: 0.88-1.12   Therap INR: 2.0-3.5) Comments: Rita Ohara  July 09, 2010 9:19 AM     Orders Added: 1)  Est. Patient Level I [99211] 2)  Protime [[id number]]   ANTICOAGULATION RECORD PREVIOUS REGIMEN & LAB RESULTS Anticoagulation Diagnosis:  v58.83,v58.61,415.19 on  07/27/2007 Previous INR Goal Range:  2.0-3.0 on  05/03/2008 Previous INR:  1.9 on  06/09/2010 Previous Coumadin Dose(mg):  2.5mg  on thu & sun 5mg  other days on  02/10/2010 Previous Regimen:  same on  05/07/2010 Previous Coagulation Comments:  By Dr. Kirtland Bouchard on  07/01/2008  NEW REGIMEN & LAB RESULTS Current INR: 2.3 Regimen: same  Repeat testing in: 4 weeks  Anticoagulation Visit Questionnaire Coumadin dose missed/changed:  No Abnormal Bleeding Symptoms:  No  Any diet changes including alcohol intake, vegetables or greens since the last visit:  No Any illnesses or hospitalizations since the last visit:  No Any signs of clotting since the last visit (including chest discomfort, dizziness, shortness of breath, arm tingling, slurred speech, swelling or redness in leg):  No  MEDICATIONS ADPRIN B 325 MG  TABS (ASPIRIN BUF(CACARB-MGCARB-MGO)) one daily COREG 25 MG TABS (CARVEDILOL) one by mouth two times a day WARFARIN SODIUM 5 MG  TABS (WARFARIN SODIUM) Take daily as directed ENALAPRIL MALEATE 10 MG TABS (ENALAPRIL MALEATE) one two times a day LANOXIN 0.25 MG TABS (DIGOXIN) one daily [BMN] OMEGA-3 1000 MG CAPS (OMEGA-3 FATTY ACIDS) Take once a day ALBUTEROL 90 MCG/ACT AERS (ALBUTEROL) 2 puffs q 4 hours as needed LIPITOR 80 MG TABS (ATORVASTATIN CALCIUM) Take one tablet by mouth daily  as directed FLUTICASONE PROPIONATE 50 MCG/ACT  SUSP (FLUTICASONE PROPIONATE) 2 sprays each nostril once daily

## 2011-01-05 NOTE — Procedures (Signed)
Summary: DEVICE/SAF      Allergies Added:   Current Medications (verified): 1)  Adprin B 325 Mg  Tabs (Aspirin Buf(Cacarb-Mgcarb-Mgo)) .... One Daily 2)  Coreg 25 Mg Tabs (Carvedilol) .... One By Mouth Two Times A Day 3)  Warfarin Sodium 5 Mg  Tabs (Warfarin Sodium) .... Take Daily As Directed 4)  Enalapril Maleate 10 Mg Tabs (Enalapril Maleate) .... One Two Times A Day 5)  Lanoxin 0.25 Mg Tabs (Digoxin) .... One Daily 6)  Omega-3 1000 Mg Caps (Omega-3 Fatty Acids) .... Take Once A Day 7)  Albuterol 90 Mcg/act Aers (Albuterol) .... 2 Puffs Q 4 Hours As Needed 8)  Lipitor 80 Mg Tabs (Atorvastatin Calcium) .... Take One Tablet By Mouth Daily  As Directed 9)  Fluticasone Propionate 50 Mcg/act  Susp (Fluticasone Propionate) .... 2 Sprays Each Nostril Once Daily  Allergies (verified): 1)  Penicillin G Potassium (Penicillin G Potassium) 2)  Sulfamethoxazole (Sulfamethoxazole)   ICD Specifications Following MD:  [name], MD     ICD Vendor:  [employer]     ICD Model Number:  D154VWC     ICD Serial Number:  [id number] ICD DOI:  07/22/2008     ICD Implanting MD:  [name], MD  Lead 1:    Location: RV     DOI: 06/27/2000     Model #: [id number]     Serial #: [id number]     Status: active  Indications::  VT   ICD Follow Up Remote Check?  No Battery Voltage:  3.16 V     Charge Time:  9.0 seconds     ICD Dependent:  No       ICD Device Measurements Right Ventricle:  Amplitude: 9.0 mV, Impedance: 496 ohms, Threshold: 105 V at 0.8 msec Shock Impedance: 68 ohms   Episodes Coumadin:  Yes Shock:  0     ATP:  0     Nonsustained:  3     Ventricular Pacing:  9.5%  Brady Parameters Mode VVI     Lower Rate Limit:  40      Tachy Zones VF:  214     VT:  182     Next Cardiology Appt Due:  12/06/2010 Tech Comments:  No parameter changes.  Device function normal.  3 NSVT episodes noted the longest 3 seconds. Optivol and thoracic impedance normal.  ROV 3 months with [name]. Altha Harm, LPN   September 09, 2010 9:11 AM

## 2011-01-05 NOTE — Procedures (Signed)
Summary: pacer check      Allergies Added:   Current Medications (verified): 1)  Adprin B 325 Mg  Tabs (Aspirin Buf(Cacarb-Mgcarb-Mgo)) .... One Daily 2)  Coreg 25 Mg Tabs (Carvedilol) .... One By Mouth Two Times A Day 3)  Warfarin Sodium 5 Mg  Tabs (Warfarin Sodium) .... Take Daily As Directed 4)  Enalapril Maleate 10 Mg Tabs (Enalapril Maleate) .... One Two Times A Day 5)  Lanoxin 0.25 Mg Tabs (Digoxin) .... One Daily 6)  Omega-3 1000 Mg Caps (Omega-3 Fatty Acids) .... Take Once A Day 7)  Albuterol 90 Mcg/act Aers (Albuterol) .... 2 Puffs Q 4 Hours As Needed 8)  Lipitor 80 Mg Tabs (Atorvastatin Calcium) .... Take One Tablet By Mouth Daily  As Directed 9)  Fluticasone Propionate 50 Mcg/act  Susp (Fluticasone Propionate) .... 2 Sprays Each Nostril Once Daily  Allergies (verified): 1)  Penicillin G Potassium (Penicillin G Potassium) 2)  Sulfamethoxazole (Sulfamethoxazole)    ICD Specifications Following MD:  [name], MD     ICD Vendor:  [employer]     ICD Model Number:  D154VWC     ICD Serial Number:  [id number] ICD DOI:  07/22/2008     ICD Implanting MD:  [name], MD  Lead 1:    Location: RV     DOI: 06/27/2000     Model #: [id number]     Serial #: [id number]     Status: active  Indications::  VT   ICD Follow Up Remote Check?  No Battery Voltage:  3.17 V     Charge Time:  9.2 seconds     Underlying rhythm:  AFIB ICD Dependent:  No       ICD Device Measurements Right Ventricle:  Amplitude: 7.8 mV, Impedance: 488 ohms, Threshold: 1.5 V at 0.8 msec Shock Impedance: 65 ohms   Episodes Coumadin:  Yes Shock:  0     ATP:  0     Nonsustained:  0     Ventricular Pacing:  5.9%  Brady Parameters Mode VVI     Lower Rate Limit:  40      Tachy Zones VF:  214     VT:  182     Next Cardiology Appt Due:  06/05/2010 Tech Comments:  Normal device function.  No changes made today.  Pt with known afib, on Coumadin.  Histagrams appropriate for patient.  ROV 3 months clinic. Gypsy Balsam  RN BSN  March 11, 2010 9:43 AM

## 2011-01-05 NOTE — Assessment & Plan Note (Signed)
Summary: 6 month rov/njr   Vital Signs:  Patient profile:   70 year old male Height:      72 inches (182.88 cm) Weight:      199 pounds (90.45 kg) Temp:     98.4 degrees F (36.89 degrees C) oral Pulse rate:   56 / minute BP sitting:   104 / 58  (left arm) Cuff size:   regular  Vitals Entered By: Josph Macho RMA (June 16, 2010 9:05 AM) CC: 6 month follow up/ CF Is Patient Diabetic? Yes   CC:  6 month follow up/ CF.  History of Present Illness:  Follow-Up Visit      This is a Carl Wood who presents for Follow-up visit.  The patient denies chest pain and palpitations.  Since the last visit the patient notes no new problems or concerns.  The patient reports taking meds as prescribed.  When questioned about possible medication side effects, the patient notes none.  reviewed icd record---cardiology note. 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) 2)  Implantable Defibrillatormdt  (ICD-V45.02) 3)  Ventricular Tachycardia Polymorphic  (ICD-427.1) 4)  Cardiomyopathy, Ischemic  (ICD-414.8) 5)  Atrial Fibrillation-permanent  (ICD-427.31) 6)  Coronary Artery Disease  (ICD-414.00) 7)  Bladder Outlet Obstruction  (ICD-596.0) 8)  Pseudogout  (ICD-275.49) 9)  Nephrolithiasis  (ICD-592.0) 10)  Hematuria  (ICD-599.70) 11)  Encounter For Therapeutic Drug Monitoring  (ICD-V58.83) 12)  Coumadin Therapy  (ICD-V58.61) 13)  Carcinoma, Basal Cell  (ICD-173.9) 14)  Nephrolithiasis, Hx of  (ICD-V13.01) 15)  Myocardial Infarction, Hx of  (ICD-412) 16)  Hypertension  (ICD-401.9) 17)  Hyperlipidemia  (ICD-272.4) 18)  Diverticulosis, Colon  (ICD-562.10) 19)  Diabetes Mellitus, Type II  (ICD-250.00) 20)  Congestive Heart Failure  (ICD-428.0) 21)  Colonic Polyps, Hx of  (ICD-V12.72) 22)  Pulmonary Embolism, Hx of  (ICD-V12.51)  Current Medications (verified): 1)  Adprin  B 325 Mg  Tabs (Aspirin Buf(Cacarb-Mgcarb-Mgo)) .... One Daily 2)  Coreg 25 Mg Tabs (Carvedilol) .... One By Mouth Two Times A Day 3)  Warfarin Sodium 5 Mg  Tabs (Warfarin Sodium) .... Take Daily As Directed 4)  Enalapril Maleate 10 Mg Tabs (Enalapril Maleate) .... One Two Times A Day 5)  Lanoxin 0.25 Mg Tabs (Digoxin) .... One Daily 6)  Omega-3 1000 Mg Caps (Omega-3 Fatty Acids) .... Take Once A Day 7)  Albuterol 90 Mcg/act Aers (Albuterol) .... 2 Puffs Q 4 Hours As Needed 8)  Lipitor 80 Mg Tabs (Atorvastatin Calcium) .... Take One Tablet By Mouth Daily  As Directed 9)  Fluticasone Propionate 50 Mcg/act  Susp (Fluticasone Propionate) .... 2 Sprays Each Nostril Once Daily  Allergies (verified): 1)  Penicillin G Potassium (Penicillin G Potassium) 2)  Sulfamethoxazole (Sulfamethoxazole)  Past History:  Past Medical History: Last updated: 09/22/2009 Pulmonary embolism, hx of--after icd placed 1993 Colonic polyps, hx of Congestive heart failure Coronary artery disease Diabetes mellitus, type II 2006 Diverticulosis, colon Hyperlipidemia Hypertension Myocardial infarction, hx of Nephrolithiasis, hx of VTACH Ef 30% (LV dysfunction) fam hx colon ca [employer] [id number]   Past Surgical History: Last updated: 09/22/2009 back surgery AICD placement, multiple--[employer] Virtuoso [id number] Back Surgery Basal Cell CA nose Knee Arthroscopy  Social History: Last updated: 07/05/2007 Married Regular exercise-yes  Risk Factors: Exercise: yes (07/05/2007)  Risk Factors: Smoking Status: current (03/10/2010) Packs/Day: 0.5 (03/10/2010)  Physical Exam  General:  Well-developed,well-nourished,in no acute distress; alert,appropriate and cooperative throughout examination Head:  normocephalic and atraumatic.   Eyes:  pupils equal and pupils round.   Ears:  R ear normal and L ear normal.   Neck:  No deformities, masses, or tenderness noted. Chest Wall:  No deformities, masses,  tenderness or gynecomastia noted. Lungs:  normal respiratory effort and no intercostal retractions.   Heart:  regular rhythm and no murmur.   Abdomen:  soft and non-tender.   Msk:  No deformity or scoliosis noted of thoracic or lumbar spine.   Pulses:  R radial normal and L radial normal.   Neurologic:  cranial nerves II-XII intact and gait normal.     Impression & Recommendations:  Problem # 1:  CARDIOMYOPATHY, ISCHEMIC (ICD-414.8) ICD in placement reviewed CV record His updated medication list for this problem includes:    Adprin B 325 Mg Tabs (Aspirin buf(cacarb-mgcarb-mgo)) ..... One daily    Coreg 25 Mg Tabs (Carvedilol) ..... One by mouth two times a day    Enalapril Maleate 10 Mg Tabs (Enalapril maleate) ..... One two times a day  Problem # 2:  ATRIAL FIBRILLATION-PERMANENT (ICD-427.31) no sxs continue current medications  His updated medication list for this problem includes:    Adprin B 325 Mg Tabs (Aspirin buf(cacarb-mgcarb-mgo)) ..... One daily    Coreg 25 Mg Tabs (Carvedilol) ..... One by mouth two times a day    Warfarin Sodium 5 Mg Tabs (Warfarin sodium) .Marland Kitchen... Take daily as directed    Lanoxin 0.25 Mg Tabs (Digoxin) ..... One daily  Reviewed the following: PT: 18.7 (04/06/2010)   INR: 1.9 (06/09/2010) Next Protime: 4 weeks (dated on 06/09/2010)  Problem # 3:  HYPERTENSION (ICD-401.9) controlled continue current medications  His updated medication list for this problem includes:    Coreg 25 Mg Tabs (Carvedilol) ..... One by mouth two times a day    Enalapril Maleate 10 Mg Tabs (Enalapril maleate) ..... One two times a day  BP today: 104/58 Prior BP: 102/58 (03/10/2010)  Prior 10 Yr Risk Heart Disease: N/A (10/17/2006)  Labs Reviewed: K+: 4.5 (06/09/2010) Creat: : 0.9 (06/09/2010)   Chol: 135 (06/09/2010)   HDL: 30.40 (06/09/2010)   LDL: 87 (06/09/2010)   TG: 89.0 (06/09/2010)  Problem # 4:  PSEUDOGOUT (ICD-275.49) no recurrence  Problem # 5:  DIABETES  MELLITUS, TYPE II (ICD-250.00) no meds continue on no DM meds advised diet/exercise His updated medication list for this problem includes:    Adprin B 325 Mg Tabs (Aspirin buf(cacarb-mgcarb-mgo)) ..... One daily    Enalapril Maleate 10 Mg Tabs (Enalapril maleate) ..... One two times a day  Labs Reviewed: Creat: 0.9 (06/09/2010)     Last Eye Exam: normal (12/06/2009) Reviewed HgBA1c results: 6.7 (06/09/2010)  6.3 (12/12/2009)  Complete Medication List: 1)  Adprin B 325 Mg Tabs (Aspirin buf(cacarb-mgcarb-mgo)) .... One daily 2)  Coreg 25 Mg Tabs (Carvedilol) .... One by mouth two times a day 3)  Warfarin Sodium 5 Mg Tabs (Warfarin sodium) .... Take daily as directed 4)  Enalapril Maleate 10 Mg Tabs (Enalapril maleate) .... One two times a day 5)  Lanoxin 0.25 Mg Tabs (Digoxin) .... One daily 6)  Omega-3 1000 Mg Caps (Omega-3 fatty acids) .... Take once a day 7)  Albuterol 90 Mcg/act Aers (Albuterol) .... 2 puffs q 4 hours as needed 8)  Lipitor 80 Mg Tabs (Atorvastatin calcium) .... Take one tablet by mouth daily  as directed 9)  Fluticasone Propionate 50 Mcg/act Susp (Fluticasone propionate) .Marland KitchenMarland KitchenMarland Kitchen  2 sprays each nostril once daily  Patient Instructions: 1)  Please schedule a follow-up appointment in 6 months. 2)  labs one week prior to visit 3)  lipids---272.4 4)  lfts-995.2 5)  bmet-995.2 6)  A1C-250.02 7)

## 2011-01-05 NOTE — Assessment & Plan Note (Signed)
Summary: PT/RCD   Nurse Visit   Allergies: 1)  Penicillin G Potassium (Penicillin G Potassium) 2)  Sulfamethoxazole (Sulfamethoxazole) Laboratory Results   Blood Tests      INR: 1.8   (Normal Range: 0.88-1.12   Therap INR: 2.0-3.5) Comments: Rita Ohara  August 07, 2010 9:12 AM     Orders Added: 1)  Est. Patient Level I [99211] 2)  Protime [84132GM]   ANTICOAGULATION RECORD PREVIOUS REGIMEN & LAB RESULTS Anticoagulation Diagnosis:  v58.83,v58.61,415.19 on  07/27/2007 Previous INR Goal Range:  2.0-3.0 on  05/03/2008 Previous INR:  2.3 on  07/09/2010 Previous Coumadin Dose(mg):  2.5mg  on thu & sun 5mg  other days on  02/10/2010 Previous Regimen:  same on  07/09/2010 Previous Coagulation Comments:  By Dr. Kirtland Bouchard on  07/01/2008  NEW REGIMEN & LAB RESULTS Current INR: 1.8 Regimen: same  Repeat testing in: 4 weeks  Anticoagulation Visit Questionnaire Coumadin dose missed/changed:  No Abnormal Bleeding Symptoms:  No  Any diet changes including alcohol intake, vegetables or greens since the last visit:  No Any illnesses or hospitalizations since the last visit:  No Any signs of clotting since the last visit (including chest discomfort, dizziness, shortness of breath, arm tingling, slurred speech, swelling or redness in leg):  Yes  MEDICATIONS ADPRIN B 325 MG  TABS (ASPIRIN BUF(CACARB-MGCARB-MGO)) one daily COREG 25 MG TABS (CARVEDILOL) one by mouth two times a day WARFARIN SODIUM 5 MG  TABS (WARFARIN SODIUM) Take daily as directed ENALAPRIL MALEATE 10 MG TABS (ENALAPRIL MALEATE) one two times a day LANOXIN 0.25 MG TABS (DIGOXIN) one daily [BMN] OMEGA-3 1000 MG CAPS (OMEGA-3 FATTY ACIDS) Take once a day ALBUTEROL 90 MCG/ACT AERS (ALBUTEROL) 2 puffs q 4 hours as needed LIPITOR 80 MG TABS (ATORVASTATIN CALCIUM) Take one tablet by mouth daily  as directed FLUTICASONE PROPIONATE 50 MCG/ACT  SUSP (FLUTICASONE PROPIONATE) 2 sprays each nostril once daily

## 2011-01-05 NOTE — Assessment & Plan Note (Signed)
Summary: pc2      Allergies Added:   History of Present Illness: Mr. Carl Wood is seen in followup for ischemic heart disease with previously implanted device for    ventricular tachycardia. He has history of bypass grafting and depressed left ventricular function.   He has  a history of recurrejnt ventricular tachycardia--most recently polymorphic in september associated with low-normal magnesium.  He underwent a Myoview scanning demonstrated ejection fraction of 27% without ischemia but with a large LAD infarct. This was unchanged. The patient denies SOB, chest pain, edema or palpitations.    Current Medications (verified): 1)  Adprin B 325 Mg  Tabs (Aspirin Buf(Cacarb-Mgcarb-Mgo)) .... One Daily 2)  Coreg 25 Mg Tabs (Carvedilol) .... One By Mouth Bid 3)  Warfarin Sodium 5 Mg  Tabs (Warfarin Sodium) .... Take Daily As Directed 4)  Enalapril Maleate 10 Mg Tabs (Enalapril Maleate) .... One Bid 5)  Lanoxin 0.25 Mg Tabs (Digoxin) .... One Daily 6)  Omega-3 1000 Mg Caps (Omega-3 Fatty Acids) .... Take Once A Day 7)  Albuterol 90 Mcg/act Aers (Albuterol) .... 2 Puffs Q 4 Hours As Needed 8)  Lipitor 80 Mg Tabs (Atorvastatin Calcium) .... Take One Tablet By Mouth Daily  As Directed  Allergies (verified): 1)  Penicillin G Potassium (Penicillin G Potassium) 2)  Sulfamethoxazole (Sulfamethoxazole)  Past History:  Past Medical History: Last updated: 09/22/2009 Pulmonary embolism, hx of--after icd placed 1993 Colonic polyps, hx of Congestive heart failure Coronary artery disease Diabetes mellitus, type II 2006 Diverticulosis, colon Hyperlipidemia Hypertension Myocardial infarction, hx of Nephrolithiasis, hx of VTACH Ef 30% (LV dysfunction) fam hx colon ca [employer] [id number]   Past Surgical History: Last updated: 09/22/2009 back surgery AICD placement, multiple--[employer] [id number] Back Surgery Basal Cell CA nose Knee Arthroscopy  Social History: Last updated:  07/05/2007 Married Regular exercise-yes  Vital Signs:  Patient profile:   70 year old male Height:      72 inches Weight:      205 pounds BMI:     27.90 Pulse rate:   56 / minute Pulse rhythm:   irregular BP sitting:   102 / 54  (left arm) Cuff size:   large  Vitals Entered By: Judithe Modest CMA (December 09, 2009 8:59 AM)  Physical Exam  General:  The patient was alert and oriented in no acute distress. HEENT Normal.  Neck veins were 7-8 cm, carotids were brisk.  Lungs were clear.  Heart sounds were regular without murmurs or gallops.  Abdomen was soft with active bowel sounds. There is no clubbing cyanosis or edema. Skin Warm and dry Neuro grossly normal   EKG  Procedure date:  12/09/2009  Findings:      atrial fibrillation Heart rate 53 Left axis deviation Prior anteroseptal MI Occasional ventricular paced   ICD Specifications Following MD:  [name], MD     ICD Vendor:  [employer]     ICD Model Number:  [id number]     ICD Serial Number:  [id number] ICD DOI:  07/22/2008     ICD Implanting MD:  [name], MD  Lead 1:    Location: RV     DOI: 06/27/2000     Model #: [id number]     Serial #: [id number]     Status: active  Indications::  VT   ICD Follow Up Remote Check?  No Battery Voltage:  3.19 V     Charge Time:  8.3 seconds     Underlying rhythm:  A-fib ICD  Dependent:  No       ICD Device Measurements Right Ventricle:  Amplitude: 7.8 mV, Impedance: 480 ohms, Threshold: 1.5 V at 0.8 msec Shock Impedance: 66 ohms   Episodes Coumadin:  Yes Shock:  0     ATP:  0     Nonsustained:  0     Ventricular Pacing:  3.3%  Brady Parameters Mode VVI     Lower Rate Limit:  40      Tachy Zones VF:  214     VT:  182     Next Cardiology Appt Due:  03/06/2010 Tech Comments:  No parameter changes.  Device function normal.  A-fib, rates 50-105 today, + coumadin. Optivol index elevated in November.    ROV 3 months clinic. Altha Harm, LPN  December 09, 2009 9:10 AM    Impression & Recommendations:  Problem # 1:  ATRIAL FIBRILLATION-PERMANENT (ICD-427.31) heart rate is adequately controlled His updated medication list for this problem includes:    Adprin B 325 Mg Tabs (Aspirin buf(cacarb-mgcarb-mgo)) ..... One daily    Coreg 25 Mg Tabs (Carvedilol) ..... One by mouth bid    Warfarin Sodium 5 Mg Tabs (Warfarin sodium) .Marland Kitchen... Take daily as directed    Lanoxin 0.25 Mg Tabs (Digoxin) ..... One daily  Problem # 2:  CONGESTIVE HEART FAILURE (ICD-428.0) he has chronic congestive heart failure; based on the recent aldosterone antagonist trial, I think he is probably a candidate for the addition of Aldactone.  We have revoiewed side effects and I will check with DR DB about this cohort. His updated medication list for this problem includes:    Adprin B 325 Mg Tabs (Aspirin buf(cacarb-mgcarb-mgo)) ..... One daily    Coreg 25 Mg Tabs (Carvedilol) ..... One by mouth bid    Warfarin Sodium 5 Mg Tabs (Warfarin sodium) .Marland Kitchen... Take daily as directed    Enalapril Maleate 10 Mg Tabs (Enalapril maleate) ..... One bid    Lanoxin 0.25 Mg Tabs (Digoxin) ..... One daily  Problem # 3:  VENTRICULAR TACHYCARDIA POLYMORPHIC (ICD-427.1) no recurrent VT ated medication list for this problem includes:    Adprin B 325 Mg Tabs (Aspirin buf(cacarb-mgcarb-mgo)) ..... One daily    Coreg 25 Mg Tabs (Carvedilol) ..... One by mouth bid    Warfarin Sodium 5 Mg Tabs (Warfarin sodium) .Marland Kitchen... Take daily as directed    Enalapril Maleate 10 Mg Tabs (Enalapril maleate) ..... One bid  Problem # 4:  CARDIOMYOPATHY, ISCHEMIC (ICD-414.8) no Chest pain His updated medication list for this problem includes:    Adprin B 325 Mg Tabs (Aspirin buf(cacarb-mgcarb-mgo)) ..... One daily    Coreg 25 Mg Tabs (Carvedilol) ..... One by mouth bid    Warfarin Sodium 5 Mg Tabs (Warfarin sodium) .Marland Kitchen... Take daily as directed    Enalapril Maleate 10 Mg Tabs (Enalapril maleate) ..... One bid    Lanoxin 0.25 Mg Tabs  (Digoxin) ..... One daily  Problem # 5:  IMPLANTABLE   DEFIBRILLATORMDT (ICD-V45.02) Normal device function   Patient Instructions: 1)  Your physician recommends that you schedule a follow-up appointment in: 3 months with device clinic

## 2011-01-05 NOTE — Assessment & Plan Note (Signed)
Summary: mole on ear/sore to touch/then go to pt/cjr   Vital Signs:  Patient profile:   70 year old male Weight:      200 pounds Temp:     98.1 degrees F oral Pulse rate:   60 / minute Pulse rhythm:   irregular Resp:     12 per minute BP sitting:   102 / 58  (left arm) Cuff size:   regular  Vitals Entered By: Gladis Riffle, RN (March 10, 2010 8:19 AM)  CC: c/o lesion right ear that is sensitive to touch x 3-4 weeks Is Patient Diabetic? Yes Did you bring your meter with you today? No Comments CBGs AM 90-100, PM 120-160 at home   CC:  c/o lesion right ear that is sensitive to touch x 3-4 weeks.  Preventive Screening-Counseling & Management  Alcohol-Tobacco     Smoking Status: current     Packs/Day: 0.5  Current Medications (verified): 1)  Adprin B 325 Mg  Tabs (Aspirin Buf(Cacarb-Mgcarb-Mgo)) .... One Daily 2)  Coreg 25 Mg Tabs (Carvedilol) .... One By Mouth Two Times A Day 3)  Warfarin Sodium 5 Mg  Tabs (Warfarin Sodium) .... Take Daily As Directed 4)  Enalapril Maleate 10 Mg Tabs (Enalapril Maleate) .... One Two Times A Day 5)  Lanoxin 0.25 Mg Tabs (Digoxin) .... One Daily 6)  Omega-3 1000 Mg Caps (Omega-3 Fatty Acids) .... Take Once A Day 7)  Albuterol 90 Mcg/act Aers (Albuterol) .... 2 Puffs Q 4 Hours As Needed 8)  Lipitor 80 Mg Tabs (Atorvastatin Calcium) .... Take One Tablet By Mouth Daily  As Directed 9)  Fluticasone Propionate 50 Mcg/act  Susp (Fluticasone Propionate) .... 2 Sprays Each Nostril Once Daily  Allergies: 1)  Penicillin G Potassium (Penicillin G Potassium) 2)  Sulfamethoxazole (Sulfamethoxazole)   Impression & Recommendations:  Problem # 1:  NEOPLASM, MALIGNANT, SKIN, EAR (ICD-173.2)  liquid nitrogen to lesion ear 8mm  Orders: Cryotherapy/Destruction benign or premalignant lesion (1st lesion)  (17000)  Complete Medication List: 1)  Adprin B 325 Mg Tabs (Aspirin buf(cacarb-mgcarb-mgo)) .... One daily 2)  Coreg 25 Mg Tabs (Carvedilol) .... One by  mouth two times a day 3)  Warfarin Sodium 5 Mg Tabs (Warfarin sodium) .... Take daily as directed 4)  Enalapril Maleate 10 Mg Tabs (Enalapril maleate) .... One two times a day 5)  Lanoxin 0.25 Mg Tabs (Digoxin) .... One daily 6)  Omega-3 1000 Mg Caps (Omega-3 fatty acids) .... Take once a day 7)  Albuterol 90 Mcg/act Aers (Albuterol) .... 2 puffs q 4 hours as needed 8)  Lipitor 80 Mg Tabs (Atorvastatin calcium) .... Take one tablet by mouth daily  as directed 9)  Fluticasone Propionate 50 Mcg/act Susp (Fluticasone propionate) .... 2 sprays each nostril once daily  Other Orders: Fingerstick (16109) Protime (60454UJ)  Laboratory Results   Blood Tests     PT: 19.9 s   (Normal Range: 10.6-13.4)  INR: 2.7   (Normal Range: 0.88-1.12   Therap INR: 2.0-3.5) Comments: Rita Ohara  March 10, 2010 8:09 AM       ANTICOAGULATION RECORD PREVIOUS REGIMEN & LAB RESULTS Anticoagulation Diagnosis:  v58.83,v58.61,415.19 on  07/27/2007 Previous INR Goal Range:  2.0-3.0 on  05/03/2008 Previous INR:  1.9 on  02/10/2010 Previous Coumadin Dose(mg):  2.5mg  on thu & sun 5mg  other days on  02/10/2010 Previous Regimen:  same on  01/13/2010 Previous Coagulation Comments:  By Dr. Kirtland Bouchard on  07/01/2008  NEW REGIMEN & LAB RESULTS Current INR:  2.7 Regimen: same  Repeat testing in: 1 month  Anticoagulation Visit Questionnaire Coumadin dose missed/changed:  No Abnormal Bleeding Symptoms:  No  Any diet changes including alcohol intake, vegetables or greens since the last visit:  No Any illnesses or hospitalizations since the last visit:  No Any signs of clotting since the last visit (including chest discomfort, dizziness, shortness of breath, arm tingling, slurred speech, swelling or redness in leg):  No  MEDICATIONS ADPRIN B 325 MG  TABS (ASPIRIN BUF(CACARB-MGCARB-MGO)) one daily COREG 25 MG TABS (CARVEDILOL) one by mouth two times a day WARFARIN SODIUM 5 MG  TABS (WARFARIN SODIUM) Take daily as  directed ENALAPRIL MALEATE 10 MG TABS (ENALAPRIL MALEATE) one two times a day LANOXIN 0.25 MG TABS (DIGOXIN) one daily [BMN] OMEGA-3 1000 MG CAPS (OMEGA-3 FATTY ACIDS) Take once a day ALBUTEROL 90 MCG/ACT AERS (ALBUTEROL) 2 puffs q 4 hours as needed LIPITOR 80 MG TABS (ATORVASTATIN CALCIUM) Take one tablet by mouth daily  as directed FLUTICASONE PROPIONATE 50 MCG/ACT  SUSP (FLUTICASONE PROPIONATE) 2 sprays each nostril once daily

## 2011-01-05 NOTE — Cardiovascular Report (Signed)
Summary: Office Visit   Office Visit   Imported By: Roderic Ovens 10/05/2010 13:14:28  _____________________________________________________________________  External Attachment:    Type:   Image     Comment:   External Document

## 2011-01-05 NOTE — Assessment & Plan Note (Signed)
Summary: pt/njr PT RSC/NJR   Nurse Visit   Allergies: 1)  Penicillin G Potassium (Penicillin G Potassium) 2)  Sulfamethoxazole (Sulfamethoxazole) Laboratory Results   Blood Tests   Date/Time Received: Apr 06, 2010 3:23 PM  Date/Time Reported: Apr 06, 2010 3:24 PM   PT: 18.7 s   (Normal Range: 10.6-13.4)  INR: 2.3   (Normal Range: 0.88-1.12   Therap INR: 2.0-3.5) Comments: Rita Ohara  Apr 06, 2010 3:24 PM     Orders Added: 1)  Est. Patient Level I [99211] 2)  Protime [[id number]]   ANTICOAGULATION RECORD PREVIOUS REGIMEN & LAB RESULTS Anticoagulation Diagnosis:  v58.83,v58.61,415.19 on  07/27/2007 Previous INR Goal Range:  2.0-3.0 on  05/03/2008 Previous INR:  2.7 on  03/10/2010 Previous Coumadin Dose(mg):  2.5mg  on thu & sun 5mg  other days on  02/10/2010 Previous Regimen:  same on  03/10/2010 Previous Coagulation Comments:  By Dr. Kirtland Bouchard on  07/01/2008  NEW REGIMEN & LAB RESULTS Current INR: 2.3 Regimen: same  (no change)  Repeat testing in: 1 month  Anticoagulation Visit Questionnaire Coumadin dose missed/changed:  No Abnormal Bleeding Symptoms:  No  Any diet changes including alcohol intake, vegetables or greens since the last visit:  No Any illnesses or hospitalizations since the last visit:  No Any signs of clotting since the last visit (including chest discomfort, dizziness, shortness of breath, arm tingling, slurred speech, swelling or redness in leg):  No  MEDICATIONS ADPRIN B 325 MG  TABS (ASPIRIN BUF(CACARB-MGCARB-MGO)) one daily COREG 25 MG TABS (CARVEDILOL) one by mouth two times a day WARFARIN SODIUM 5 MG  TABS (WARFARIN SODIUM) Take daily as directed ENALAPRIL MALEATE 10 MG TABS (ENALAPRIL MALEATE) one two times a day LANOXIN 0.25 MG TABS (DIGOXIN) one daily [BMN] OMEGA-3 1000 MG CAPS (OMEGA-3 FATTY ACIDS) Take once a day ALBUTEROL 90 MCG/ACT AERS (ALBUTEROL) 2 puffs q 4 hours as needed LIPITOR 80 MG TABS (ATORVASTATIN CALCIUM) Take one tablet by  mouth daily  as directed FLUTICASONE PROPIONATE 50 MCG/ACT  SUSP (FLUTICASONE PROPIONATE) 2 sprays each nostril once daily

## 2011-01-05 NOTE — Assessment & Plan Note (Signed)
Summary: pt/cjr   Nurse Visit   Allergies: 1)  Penicillin G Potassium (Penicillin G Potassium) 2)  Sulfamethoxazole (Sulfamethoxazole) Laboratory Results   Blood Tests   Date/Time Received: October 28, 2010 2:55 PM  Date/Time Reported: October 28, 2010 2:55 PM    INR: 2.2   (Normal Range: 0.88-1.12   Therap INR: 2.0-3.5) Comments: Wynona Canes, CMA  October 28, 2010 2:55 PM      Orders Added: 1)  Est. Patient Level I [99211] 2)  Protime [[id number]]  Laboratory Results   Blood Tests      INR: 2.2   (Normal Range: 0.88-1.12   Therap INR: 2.0-3.5) Comments: Wynona Canes, CMA  October 28, 2010 2:55 PM        ANTICOAGULATION RECORD PREVIOUS REGIMEN & LAB RESULTS Anticoagulation Diagnosis:  v58.83,v58.61,415.19 on  07/27/2007 Previous INR Goal Range:  2.0-3.0 on  05/03/2008 Previous INR:  2.0 on  10/02/2010 Previous Coumadin Dose(mg):  2.5mg  on thu & sun 5mg  other days on  02/10/2010 Previous Regimen:  same on  10/02/2010 Previous Coagulation Comments:  By Dr. Kirtland Bouchard on  07/01/2008  NEW REGIMEN & LAB RESULTS Current INR: 2.2 Regimen: same  (no change)       Repeat testing in: 4 weeks MEDICATIONS ADPRIN B 325 MG  TABS (ASPIRIN BUF(CACARB-MGCARB-MGO)) one daily COREG 25 MG TABS (CARVEDILOL) one by mouth two times a day WARFARIN SODIUM 5 MG  TABS (WARFARIN SODIUM) Take daily as directed ENALAPRIL MALEATE 10 MG TABS (ENALAPRIL MALEATE) one two times a day LANOXIN 0.25 MG TABS (DIGOXIN) one daily [BMN] OMEGA-3 1000 MG CAPS (OMEGA-3 FATTY ACIDS) Take once a day ALBUTEROL 90 MCG/ACT AERS (ALBUTEROL) 2 puffs q 4 hours as needed LIPITOR 80 MG TABS (ATORVASTATIN CALCIUM) Take one tablet by mouth daily  as directed FLUTICASONE PROPIONATE 50 MCG/ACT  SUSP (FLUTICASONE PROPIONATE) 2 sprays each nostril once daily   Anticoagulation Visit Questionnaire      Coumadin dose missed/changed:  No      Abnormal Bleeding Symptoms:  No   Any diet changes including  alcohol intake, vegetables or greens since the last visit:  No Any illnesses or hospitalizations since the last visit:  No Any signs of clotting since the last visit (including chest discomfort, dizziness, shortness of breath, arm tingling, slurred speech, swelling or redness in leg):  No

## 2011-01-05 NOTE — Assessment & Plan Note (Signed)
Summary: pt/mm   Nurse Visit   Allergies: 1)  Penicillin G Potassium (Penicillin G Potassium) 2)  Sulfamethoxazole (Sulfamethoxazole) Laboratory Results   Blood Tests      INR: 2.7   (Normal Range: 0.88-1.12   Therap INR: 2.0-3.5) Comments: Rita Ohara  May 07, 2010 9:57 AM     Orders Added: 1)  Est. Patient Level I [99211] 2)  Protime [[id number]]   ANTICOAGULATION RECORD PREVIOUS REGIMEN & LAB RESULTS Anticoagulation Diagnosis:  v58.83,v58.61,415.19 on  07/27/2007 Previous INR Goal Range:  2.0-3.0 on  05/03/2008 Previous INR:  2.3 on  04/06/2010 Previous Coumadin Dose(mg):  2.5mg  on thu & sun 5mg  other days on  02/10/2010 Previous Regimen:  same on  03/10/2010 Previous Coagulation Comments:  By Dr. Kirtland Bouchard on  07/01/2008  NEW REGIMEN & LAB RESULTS Current INR: 2.7 Regimen: same  Repeat testing in: 4 weeks  Anticoagulation Visit Questionnaire Coumadin dose missed/changed:  No Abnormal Bleeding Symptoms:  No  Any diet changes including alcohol intake, vegetables or greens since the last visit:  No Any illnesses or hospitalizations since the last visit:  No Any signs of clotting since the last visit (including chest discomfort, dizziness, shortness of breath, arm tingling, slurred speech, swelling or redness in leg):  No  MEDICATIONS ADPRIN B 325 MG  TABS (ASPIRIN BUF(CACARB-MGCARB-MGO)) one daily COREG 25 MG TABS (CARVEDILOL) one by mouth two times a day WARFARIN SODIUM 5 MG  TABS (WARFARIN SODIUM) Take daily as directed ENALAPRIL MALEATE 10 MG TABS (ENALAPRIL MALEATE) one two times a day LANOXIN 0.25 MG TABS (DIGOXIN) one daily [BMN] OMEGA-3 1000 MG CAPS (OMEGA-3 FATTY ACIDS) Take once a day ALBUTEROL 90 MCG/ACT AERS (ALBUTEROL) 2 puffs q 4 hours as needed LIPITOR 80 MG TABS (ATORVASTATIN CALCIUM) Take one tablet by mouth daily  as directed FLUTICASONE PROPIONATE 50 MCG/ACT  SUSP (FLUTICASONE PROPIONATE) 2 sprays each nostril once daily

## 2011-01-05 NOTE — Progress Notes (Signed)
Summary: generic Nasonex or substitute  Phone Note Call from Patient   Caller: Patient Call For: Birdie Sons MD Summary of Call: Pt is asking for generic Nasonex or substitute as he liked the Nasonex, but it is too expensive. [phone] Initial call taken by: Lynann Beaver CMA,  January 14, 2010 9:31 AM  Follow-up for Phone Call        see rx Follow-up by: Birdie Sons MD,  January 14, 2010 11:27 AM    New/Updated Medications: FLUTICASONE PROPIONATE 50 MCG/ACT  SUSP (FLUTICASONE PROPIONATE) 2 sprays each nostril once daily Prescriptions: FLUTICASONE PROPIONATE 50 MCG/ACT  SUSP (FLUTICASONE PROPIONATE) 2 sprays each nostril once daily  #1 vial x 3   Entered and Authorized by:   Birdie Sons MD   Signed by:   Birdie Sons MD on 01/14/2010   Method used:   Electronically to        [facility]* (retail)       [address] [place], [place]  [address]       Ph: [phone]       Fax: [phone]   RxID:   [id number]  Pt notified.

## 2011-01-05 NOTE — Assessment & Plan Note (Signed)
Summary: pt/njr   Nurse Visit   Allergies: 1)  Penicillin G Potassium (Penicillin G Potassium) 2)  Sulfamethoxazole (Sulfamethoxazole) Laboratory Results   Blood Tests   Date/Time Received: September 30, 2010 7:47 AM  Date/Time Reported: September 30, 2010 7:47 AM    INR: 2.2   (Normal Range: 0.88-1.12   Therap INR: 2.0-3.5) Comments: Wynona Canes, CMA  September 30, 2010 7:47 AM     Orders Added: 1)  Admin 1st Vaccine [90471] 2)  Flu Vaccine 55yrs + [90658] 3)  Est. Patient Level I [16109] 4)  Protime [[id number]] Flu Vaccine Consent Questions     Do you have a history of severe allergic reactions to this vaccine? no    Any prior history of allergic reactions to egg and/or gelatin? no    Do you have a sensitivity to the preservative Thimersol? no    Do you have a past history of Guillan-Barre Syndrome? no    Do you currently have an acute febrile illness? no    Have you ever had a severe reaction to latex? no    Vaccine information given and explained to patient? yes    Are you currently pregnant? no    Lot Number:[id number]   Exp Date:06/05/2011   Site Given  Left Deltoid IM  Laboratory Results   Blood Tests      INR: 2.2   (Normal Range: 0.88-1.12   Therap INR: 2.0-3.5) Comments: Wynona Canes, CMA  September 30, 2010 7:47 AM       ANTICOAGULATION RECORD PREVIOUS REGIMEN & LAB RESULTS Anticoagulation Diagnosis:  v58.83,v58.61,415.19 on  07/27/2007 Previous INR Goal Range:  2.0-3.0 on  05/03/2008 Previous INR:  1.8 on  08/07/2010 Previous Coumadin Dose(mg):  2.5mg  on thu & sun 5mg  other days on  02/10/2010 Previous Regimen:  same on  08/07/2010 Previous Coagulation Comments:  By Dr. Kirtland Bouchard on  07/01/2008  NEW REGIMEN & LAB RESULTS Current INR: 2.2 Regimen: same  (no change)       Repeat testing in: 4 weeks MEDICATIONS ADPRIN B 325 MG  TABS (ASPIRIN BUF(CACARB-MGCARB-MGO)) one daily COREG 25 MG TABS (CARVEDILOL) one by mouth two times a day WARFARIN SODIUM  5 MG  TABS (WARFARIN SODIUM) Take daily as directed ENALAPRIL MALEATE 10 MG TABS (ENALAPRIL MALEATE) one two times a day LANOXIN 0.25 MG TABS (DIGOXIN) one daily [BMN] OMEGA-3 1000 MG CAPS (OMEGA-3 FATTY ACIDS) Take once a day ALBUTEROL 90 MCG/ACT AERS (ALBUTEROL) 2 puffs q 4 hours as needed LIPITOR 80 MG TABS (ATORVASTATIN CALCIUM) Take one tablet by mouth daily  as directed FLUTICASONE PROPIONATE 50 MCG/ACT  SUSP (FLUTICASONE PROPIONATE) 2 sprays each nostril once daily   Anticoagulation Visit Questionnaire      Coumadin dose missed/changed:  No      Abnormal Bleeding Symptoms:  No   Any diet changes including alcohol intake, vegetables or greens since the last visit:  No Any illnesses or hospitalizations since the last visit:  No Any signs of clotting since the last visit (including chest discomfort, dizziness, shortness of breath, arm tingling, slurred speech, swelling or redness in leg):  No

## 2011-01-05 NOTE — Cardiovascular Report (Signed)
Summary: Office Visit   Office Visit   Imported By: Roderic Ovens 07/01/2010 16:12:18  _____________________________________________________________________  External Attachment:    Type:   Image     Comment:   External Document

## 2011-01-05 NOTE — Procedures (Signed)
Summary: device/saf      Allergies Added:   Current Medications (verified): 1)  Adprin B 325 Mg  Tabs (Aspirin Buf(Cacarb-Mgcarb-Mgo)) .... One Daily 2)  Coreg 25 Mg Tabs (Carvedilol) .... One By Mouth Two Times A Day 3)  Warfarin Sodium 5 Mg  Tabs (Warfarin Sodium) .... Take Daily As Directed 4)  Enalapril Maleate 10 Mg Tabs (Enalapril Maleate) .... One Two Times A Day 5)  Lanoxin 0.25 Mg Tabs (Digoxin) .... One Daily 6)  Omega-3 1000 Mg Caps (Omega-3 Fatty Acids) .... Take Once A Day 7)  Albuterol 90 Mcg/act Aers (Albuterol) .... 2 Puffs Q 4 Hours As Needed 8)  Lipitor 80 Mg Tabs (Atorvastatin Calcium) .... Take One Tablet By Mouth Daily  As Directed 9)  Fluticasone Propionate 50 Mcg/act  Susp (Fluticasone Propionate) .... 2 Sprays Each Nostril Once Daily  Allergies (verified): 1)  Penicillin G Potassium (Penicillin G Potassium) 2)  Sulfamethoxazole (Sulfamethoxazole)   ICD Specifications Following MD:  [name], MD     ICD Vendor:  [employer]     ICD Model Number:  [id number]     ICD Serial Number:  [id number] ICD DOI:  07/22/2008     ICD Implanting MD:  [name], MD  Lead 1:    Location: RV     DOI: 06/27/2000     Model #: 0102     Serial #: [id number]     Status: active  Indications::  VT   ICD Follow Up Battery Voltage:  3.14 V     Charge Time:  9.0 seconds     Underlying rhythm:  SR ICD Dependent:  No       ICD Device Measurements Right Ventricle:  Amplitude: 9.1 mV, Impedance: 504 ohms, Threshold: 1.0 V at 0.8 msec Shock Impedance: 76 ohms   Episodes Coumadin:  Yes Shock:  1     Nonsustained:  4     Ventricular Pacing:  7.8%  Brady Parameters Mode VVI     Lower Rate Limit:  40      Tachy Zones VF:  214     VT:  182     Tech Comments:  1 VF EPISODE LASTING 13 SECONDS ENDING IN 35.2 J SHOCK--PT DIDNT REMEMBER FEELING THE SHOCK OR GETTING DIZZY OR PASSING OUT.  FLUID INCREASE FROM 05-20-10 THRU 05-25-10.  NORMAL DEVICE FUNCTION. UPDATED WAVELET TEMPLATE W/MATCHING  94-97%.  NO CHANGES MADE.  ROV IN 3 MTHS W/CLINIC.  Vella Kohler  June 10, 2010 10:40 AM

## 2011-01-05 NOTE — Assessment & Plan Note (Signed)
Summary: pt/njr   Nurse Visit   Allergies: 1)  Penicillin G Potassium (Penicillin G Potassium) 2)  Sulfamethoxazole (Sulfamethoxazole) Laboratory Results   Blood Tests      INR: 2.0   (Normal Range: 0.88-1.12   Therap INR: 2.0-3.5) Comments: Rita Ohara  October 02, 2010 8:53 AM     Orders Added: 1)  Est. Patient Level I [99211] 2)  Protime [[id number]]   ANTICOAGULATION RECORD PREVIOUS REGIMEN & LAB RESULTS Anticoagulation Diagnosis:  v58.83,v58.61,415.19 on  07/27/2007 Previous INR Goal Range:  2.0-3.0 on  05/03/2008 Previous INR:  2.2 on  09/04/2010 Previous Coumadin Dose(mg):  2.5mg  on thu & sun 5mg  other days on  02/10/2010 Previous Regimen:  same on  08/07/2010 Previous Coagulation Comments:  By Dr. Kirtland Bouchard on  07/01/2008  NEW REGIMEN & LAB RESULTS Current INR: 2.0 Regimen: same  Repeat testing in: 4 weeks  Anticoagulation Visit Questionnaire Coumadin dose missed/changed:  No Abnormal Bleeding Symptoms:  No  Any diet changes including alcohol intake, vegetables or greens since the last visit:  No Any illnesses or hospitalizations since the last visit:  No Any signs of clotting since the last visit (including chest discomfort, dizziness, shortness of breath, arm tingling, slurred speech, swelling or redness in leg):  No  MEDICATIONS ADPRIN B 325 MG  TABS (ASPIRIN BUF(CACARB-MGCARB-MGO)) one daily COREG 25 MG TABS (CARVEDILOL) one by mouth two times a day WARFARIN SODIUM 5 MG  TABS (WARFARIN SODIUM) Take daily as directed ENALAPRIL MALEATE 10 MG TABS (ENALAPRIL MALEATE) one two times a day LANOXIN 0.25 MG TABS (DIGOXIN) one daily [BMN] OMEGA-3 1000 MG CAPS (OMEGA-3 FATTY ACIDS) Take once a day ALBUTEROL 90 MCG/ACT AERS (ALBUTEROL) 2 puffs q 4 hours as needed LIPITOR 80 MG TABS (ATORVASTATIN CALCIUM) Take one tablet by mouth daily  as directed FLUTICASONE PROPIONATE 50 MCG/ACT  SUSP (FLUTICASONE PROPIONATE) 2 sprays each nostril once daily

## 2011-01-07 NOTE — Assessment & Plan Note (Signed)
Summary: PT // RS   Nurse Visit   Allergies: 1)  Penicillin G Potassium (Penicillin G Potassium) 2)  Sulfamethoxazole (Sulfamethoxazole) Laboratory Results   Blood Tests      INR: 2.3   (Normal Range: 0.88-1.12   Therap INR: 2.0-3.5) Comments: Carl Wood  November 25, 2010 1:24 PM     Orders Added: 1)  Protime [[id number]] 2)  Fingerstick [[id number]]   ANTICOAGULATION RECORD PREVIOUS REGIMEN & LAB RESULTS Anticoagulation Diagnosis:  v58.83,v58.61,415.19 on  07/27/2007 Previous INR Goal Range:  2.0-3.0 on  05/03/2008 Previous INR:  2.2 on  10/28/2010 Previous Coumadin Dose(mg):  2.5mg  on thu & sun 5mg  other days on  02/10/2010 Previous Regimen:  same on  10/02/2010 Previous Coagulation Comments:  By Dr. Kirtland Bouchard on  07/01/2008  NEW REGIMEN & LAB RESULTS Current INR: 2.3 Regimen: same  Repeat testing in: 4 weeks  Anticoagulation Visit Questionnaire Coumadin dose missed/changed:  No Abnormal Bleeding Symptoms:  No  Any diet changes including alcohol intake, vegetables or greens since the last visit:  No Any illnesses or hospitalizations since the last visit:  No Any signs of clotting since the last visit (including chest discomfort, dizziness, shortness of breath, arm tingling, slurred speech, swelling or redness in leg):  No  MEDICATIONS ADPRIN B 325 MG  TABS (ASPIRIN BUF(CACARB-MGCARB-MGO)) one daily COREG 25 MG TABS (CARVEDILOL) one by mouth two times a day WARFARIN SODIUM 5 MG  TABS (WARFARIN SODIUM) Take daily as directed ENALAPRIL MALEATE 10 MG TABS (ENALAPRIL MALEATE) one two times a day LANOXIN 0.25 MG TABS (DIGOXIN) one daily [BMN] OMEGA-3 1000 MG CAPS (OMEGA-3 FATTY ACIDS) Take once a day ALBUTEROL 90 MCG/ACT AERS (ALBUTEROL) 2 puffs q 4 hours as needed LIPITOR 80 MG TABS (ATORVASTATIN CALCIUM) Take one tablet by mouth daily  as directed FLUTICASONE PROPIONATE 50 MCG/ACT  SUSP (FLUTICASONE PROPIONATE) 2 sprays each nostril once daily

## 2011-01-07 NOTE — Assessment & Plan Note (Signed)
Summary: 6 month rov/njr   Vital Signs:  Patient profile:   70 year old male Weight:      198 pounds BMI:     26.95 Temp:     98.4 degrees F oral Pulse rate:   64 / minute BP sitting:   114 / 72  (left arm) Cuff size:   regular  Vitals Entered By: Alfred Levins, CMA (December 16, 2010 8:34 AM) CC: f/u   CC:  f/u.  History of Present Illness:  Follow-Up Visit      This is a 70 year old man who presents for Follow-up visit.  The patient denies chest pain and palpitations.  Since the last visit the patient notes no new problems or concerns.  The patient reports taking meds as prescribed.  When questioned about possible medication side effects, the patient notes none.    All other systems reviewed and were negative   Current Medications (verified): 1)  Adprin B 325 Mg  Tabs (Aspirin Buf(Cacarb-Mgcarb-Mgo)) .... One Daily 2)  Coreg 25 Mg Tabs (Carvedilol) .... One By Mouth Two Times A Day 3)  Warfarin Sodium 5 Mg  Tabs (Warfarin Sodium) .... Take Daily As Directed 4)  Enalapril Maleate 10 Mg Tabs (Enalapril Maleate) .... One Two Times A Day 5)  Lanoxin 0.25 Mg Tabs (Digoxin) .... One Daily 6)  Omega-3 1000 Mg Caps (Omega-3 Fatty Acids) .... Take Once A Day 7)  Albuterol 90 Mcg/act Aers (Albuterol) .... 2 Puffs Q 4 Hours As Needed 8)  Lipitor 80 Mg Tabs (Atorvastatin Calcium) .... Take One Tablet By Mouth Daily  As Directed 9)  Fluticasone Propionate 50 Mcg/act  Susp (Fluticasone Propionate) .... 2 Sprays Each Nostril Once Daily  Allergies (verified): 1)  Penicillin G Potassium (Penicillin G Potassium) 2)  Sulfamethoxazole (Sulfamethoxazole)  Past History:  Past Medical History: Last updated: 09/22/2009 Pulmonary embolism, hx of--after icd placed 1993 Colonic polyps, hx of Congestive heart failure Coronary artery disease Diabetes mellitus, type II 2006 Diverticulosis, colon Hyperlipidemia Hypertension Myocardial infarction, hx of Nephrolithiasis, hx  of VTACH Ef 30% (LV dysfunction) fam hx colon ca [employer] [id number]   Past Surgical History: Last updated: 09/22/2009 back surgery AICD placement, multiple--[employer] [id number] Back Surgery Basal Cell CA nose Knee Arthroscopy  Social History: Last updated: 07/05/2007 Married Regular exercise-yes  Risk Factors: Exercise: yes (07/05/2007)  Risk Factors: Smoking Status: current (03/10/2010) Packs/Day: 0.5 (03/10/2010)  Physical Exam  General:  Well-developed,well-nourished,in no acute distress; alert,appropriate and cooperative throughout examination Head:  normocephalic and atraumatic.   Eyes:  pupils equal and pupils round.   Neck:  supple.   Chest Wall:  No deformities, masses, tenderness or gynecomastia noted. Heart:  normal rate and regular rhythm.   Abdomen:  soft and non-tender.   Skin:  turgor normal and color normal.   Psych:  normally interactive and good eye contact.     Impression & Recommendations:  Problem # 1:  CARDIOMYOPATHY, ISCHEMIC (ICD-414.8) no sxs, doing well continue current medications  His updated medication list for this problem includes:    Adprin B 325 Mg Tabs (Aspirin buf(cacarb-mgcarb-mgo)) ..... One daily    Coreg 25 Mg Tabs (Carvedilol) ..... One by mouth two times a day    Enalapril Maleate 10 Mg Tabs (Enalapril maleate) ..... One two times a day  Labs Reviewed: Chol: 114 (12/08/2010)   HDL: 27.10 (12/08/2010)   LDL: 79 (12/08/2010)   TG: 41.0 (12/08/2010)  Lipid Goals: Chol Goal: 200 (10/17/2006)  HDL Goal: 40 (10/17/2006)   LDL Goal: 70 (10/17/2006)   TG Goal: 150 (10/17/2006)  Problem # 2:  ATRIAL FIBRILLATION-PERMANENT (ICD-427.31)  His updated medication list for this problem includes:    Adprin B 325 Mg Tabs (Aspirin buf(cacarb-mgcarb-mgo)) ..... One daily    Coreg 25 Mg Tabs (Carvedilol) ..... One by mouth two times a day    Warfarin Sodium 5 Mg Tabs (Warfarin sodium) .Marland Kitchen... Take daily as directed    Lanoxin  0.25 Mg Tabs (Digoxin) ..... One daily  Reviewed the following: PT: 18.7 (04/06/2010)   INR: 2.3 (11/25/2010) Next Protime: 4 weeks (dated on 11/25/2010)  Orders: Protime ([id number]) Fingerstick (56213)  Problem # 3:  HYPERTENSION (ICD-401.9) great control continue current medications  His updated medication list for this problem includes:    Coreg 25 Mg Tabs (Carvedilol) ..... One by mouth two times a day    Enalapril Maleate 10 Mg Tabs (Enalapril maleate) ..... One two times a day  BP today: 114/72 Prior BP: 104/58 (06/16/2010)  Prior 10 Yr Risk Heart Disease: N/A (10/17/2006)  Labs Reviewed: K+: 4.8 (12/08/2010) Creat: : 0.8 (12/08/2010)   Chol: 114 (12/08/2010)   HDL: 27.10 (12/08/2010)   LDL: 79 (12/08/2010)   TG: 41.0 (12/08/2010)  Problem # 4:  HYPERLIPIDEMIA (ICD-272.4) controlled continue current medications  His updated medication list for this problem includes:    Lipitor 80 Mg Tabs (Atorvastatin calcium) .Marland Kitchen... Take one tablet by mouth daily  as directed  Labs Reviewed: SGOT: 23 (12/08/2010)   SGPT: 25 (12/08/2010)  Lipid Goals: Chol Goal: 200 (10/17/2006)   HDL Goal: 40 (10/17/2006)   LDL Goal: 70 (10/17/2006)   TG Goal: 150 (10/17/2006)  Prior 10 Yr Risk Heart Disease: N/A (10/17/2006)   HDL:27.10 (12/08/2010), 30.40 (06/09/2010)  LDL:79 (12/08/2010), 87 (06/09/2010)  Chol:114 (12/08/2010), 135 (06/09/2010)  Trig:41.0 (12/08/2010), 89.0 (06/09/2010)  Complete Medication List: 1)  Adprin B 325 Mg Tabs (Aspirin buf(cacarb-mgcarb-mgo)) .... One daily 2)  Coreg 25 Mg Tabs (Carvedilol) .... One by mouth two times a day 3)  Warfarin Sodium 5 Mg Tabs (Warfarin sodium) .... Take daily as directed 4)  Enalapril Maleate 10 Mg Tabs (Enalapril maleate) .... One two times a day 5)  Lanoxin 0.25 Mg Tabs (Digoxin) .... One daily 6)  Omega-3 1000 Mg Caps (Omega-3 fatty acids) .... Take once a day 7)  Albuterol 90 Mcg/act Aers (Albuterol) .... 2 puffs q 4 hours as needed 8)   Lipitor 80 Mg Tabs (Atorvastatin calcium) .... Take one tablet by mouth daily  as directed 9)  Fluticasone Propionate 50 Mcg/act Susp (Fluticasone propionate) .... 2 sprays each nostril once daily  Patient Instructions: 1)  4 months 2)  labs one week prior to visit 3)  lipids---272.4 4)  lfts-995.2 5)  bmet-995.2 6)  A1C-250.02 7)    8)  PSA   Orders Added: 1)  Est. Patient Level IV [08657] 2)  Protime [[id number]] 3)  Fingerstick [52841]     ANTICOAGULATION RECORD PREVIOUS REGIMEN & LAB RESULTS Anticoagulation Diagnosis:  v58.83,v58.61,415.19 on  07/27/2007 Previous INR Goal Range:  2.0-3.0 on  05/03/2008 Previous INR:  2.3 on  11/25/2010 Previous Coumadin Dose(mg):  2.5mg  on thu & sun 5mg  other days on  02/10/2010 Previous Regimen:  same on  11/25/2010 Previous Coagulation Comments:  By Dr. Kirtland Bouchard on  07/01/2008  NEW REGIMEN & LAB RESULTS Current INR: 1.8 Regimen: Sun. 2.5mg . all other days 5mg .  Repeat testing in: 4 weeks  Anticoagulation Visit Questionnaire Coumadin dose  missed/changed:  No Abnormal Bleeding Symptoms:  No  Any diet changes including alcohol intake, vegetables or greens since the last visit:  No Any illnesses or hospitalizations since the last visit:  No Any signs of clotting since the last visit (including chest discomfort, dizziness, shortness of breath, arm tingling, slurred speech, swelling or redness in leg):  No  MEDICATIONS ADPRIN B 325 MG  TABS (ASPIRIN BUF(CACARB-MGCARB-MGO)) one daily COREG 25 MG TABS (CARVEDILOL) one by mouth two times a day WARFARIN SODIUM 5 MG  TABS (WARFARIN SODIUM) Take daily as directed ENALAPRIL MALEATE 10 MG TABS (ENALAPRIL MALEATE) one two times a day LANOXIN 0.25 MG TABS (DIGOXIN) one daily [BMN] OMEGA-3 1000 MG CAPS (OMEGA-3 FATTY ACIDS) Take once a day ALBUTEROL 90 MCG/ACT AERS (ALBUTEROL) 2 puffs q 4 hours as needed LIPITOR 80 MG TABS (ATORVASTATIN CALCIUM) Take one tablet by mouth daily  as  directed FLUTICASONE PROPIONATE 50 MCG/ACT  SUSP (FLUTICASONE PROPIONATE) 2 sprays each nostril once daily    Laboratory Results   Blood Tests      INR: 1.8   (Normal Range: 0.88-1.12   Therap INR: 2.0-3.5) Comments: Rita Ohara  December 16, 2010 10:27 AM

## 2011-01-13 ENCOUNTER — Other Ambulatory Visit: Payer: Self-pay | Admitting: Internal Medicine

## 2011-01-15 ENCOUNTER — Other Ambulatory Visit (INDEPENDENT_AMBULATORY_CARE_PROVIDER_SITE_OTHER): Payer: Medicare Other | Admitting: Internal Medicine

## 2011-01-15 DIAGNOSIS — I82409 Acute embolism and thrombosis of unspecified deep veins of unspecified lower extremity: Secondary | ICD-10-CM

## 2011-02-09 ENCOUNTER — Encounter (INDEPENDENT_AMBULATORY_CARE_PROVIDER_SITE_OTHER): Payer: Medicare Other | Admitting: Internal Medicine

## 2011-02-09 ENCOUNTER — Encounter: Payer: Self-pay | Admitting: Internal Medicine

## 2011-02-09 DIAGNOSIS — I4729 Other ventricular tachycardia: Secondary | ICD-10-CM

## 2011-02-09 DIAGNOSIS — Z95810 Presence of automatic (implantable) cardiac defibrillator: Secondary | ICD-10-CM

## 2011-02-09 DIAGNOSIS — I472 Ventricular tachycardia: Secondary | ICD-10-CM

## 2011-02-09 DIAGNOSIS — I2589 Other forms of chronic ischemic heart disease: Secondary | ICD-10-CM

## 2011-02-09 DIAGNOSIS — I495 Sick sinus syndrome: Secondary | ICD-10-CM | POA: Insufficient documentation

## 2011-02-12 ENCOUNTER — Ambulatory Visit (INDEPENDENT_AMBULATORY_CARE_PROVIDER_SITE_OTHER): Payer: Medicare Other | Admitting: Internal Medicine

## 2011-02-12 DIAGNOSIS — I82409 Acute embolism and thrombosis of unspecified deep veins of unspecified lower extremity: Secondary | ICD-10-CM

## 2011-02-16 NOTE — Assessment & Plan Note (Signed)
Summary: medtronic...rsc from bumplist/lg  Medications Added CARVEDILOL 6.25 MG TABS (CARVEDILOL) 1 two times a day 1        Visit Type:  Follow-up Primary Provider:  Birdie Sons MD   History of Present Illness: Carl Wood is seen in followup for ischemic heart disease with sn ICD implanted for    ventricular tachycardia. He has history of bypass grafting and depressed left ventricular function.   He has  a history of recurrejnt ventricular tachycardia--most recently polymorphic in september 2101 associated with low-normal magnesium.  He underwent a Myoview scanning demonstrated ejection fraction of 27% without ischemia but with a large LAD infarct. This was unchanged. The patient denies SOB, chest pain, edema or palpitations.however, he does have significant fatigue and difficulty getting going.  His recent lipids demonstrated HDL 27; his LDL was 79    Current Medications (verified): 1)  Adprin B 325 Mg  Tabs (Aspirin Buf(Cacarb-Mgcarb-Mgo)) .... One Daily 2)  Coreg 25 Mg Tabs (Carvedilol) .... One By Mouth Two Times A Day 3)  Warfarin Sodium 5 Mg  Tabs (Warfarin Sodium) .... Take Daily As Directed 4)  Enalapril Maleate 10 Mg Tabs (Enalapril Maleate) .... One Two Times A Day 5)  Lanoxin 0.25 Mg Tabs (Digoxin) .... One Daily 6)  Omega-3 1000 Mg Caps (Omega-3 Fatty Acids) .... Take Once A Day 7)  Albuterol 90 Mcg/act Aers (Albuterol) .... 2 Puffs Q 4 Hours As Needed 8)  Lipitor 80 Mg Tabs (Atorvastatin Calcium) .... Take One Tablet By Mouth Daily  As Directed 9)  Fluticasone Propionate 50 Mcg/act  Susp (Fluticasone Propionate) .... 2 Sprays Each Nostril Once Daily  Allergies: 1)  Penicillin G Potassium (Penicillin G Potassium) 2)  Sulfamethoxazole (Sulfamethoxazole)  Past History:  Past Medical History: Last updated: 09/22/2009 Pulmonary embolism, hx of--after icd placed 1993 Colonic polyps, hx of Congestive heart failure Coronary artery disease Diabetes mellitus, type II  2006 Diverticulosis, colon Hyperlipidemia Hypertension Myocardial infarction, hx of Nephrolithiasis, hx of VTACH Ef 30% (LV dysfunction) fam hx colon ca Medtronic Virtuoso D154   Vital Signs:  Patient profile:   70 year old male Height:      72 inches Weight:      198 pounds BMI:     26.95 Pulse rate:   54 / minute BP sitting:   120 / 70  (left arm)  Vitals Entered By: Laurance Flatten CMA (February 09, 2011 2:12 PM)  Physical Exam  General:  The patient was alert and oriented in no acute distress. HEENT Normal.  Neck veins were flat, carotids were brisk.  Lungs were clear.  Heart sounds were regular without murmurs or gallops.  Abdomen was soft with active bowel sounds. There is no clubbing cyanosis or edema. Skin Warm and dry     ICD Specifications Following MD:  [name], MD     ICD Vendor:  Medtronic     ICD Model Number:  [id number]     ICD Serial Number:  [id number] ICD DOI:  07/22/2008     ICD Implanting MD:  [name], MD  Lead 1:    Location: RV     DOI: 06/27/2000     Model #: [id number]     Serial #: [id number]     Status: active  Indications::  VT   ICD Follow Up ICD Dependent:  No      Episodes Coumadin:  Yes  Brady Parameters Mode VVI     Lower Rate Limit:  40  Tachy Zones VF:  214     VT:  182     Impression & Recommendations:  Problem # 1:  SINUS BRADYCARDIA (ICD-427.81) pt has significant bradycardia. We will try and modify this by decreasing his carvedilol 25-6.25 twice daily. We'll also cut his digoxin half to 0.125. He'll let us know about 4 weeks how he is doing. His updated medication list for this problem includes:    Adprin B 325 Mg Tabs (Aspirin buf(cacarb-mgcarb-mgo)) ..... One daily    Carvedilol 6.25 Mg Tabs (Carvedilol) .Marland Kitchen... 1 two times a day 1    Warfarin Sodium 5 Mg Tabs (Warfarin sodium) .Marland Kitchen... Take daily as directed    Enalapril Maleate 10 Mg Tabs (Enalapril maleate) ..... One two times a day  Problem # 2:  IMPLANTABLE    DEFIBRILLATORMDT (ICD-V45.02) Device parameters and data were reviewed and no changes were made  Problem # 3:  CARDIOMYOPATHY, ISCHEMIC (ICD-414.8)  stable without symptoms of angina  His updated medication list for this problem includes:    Adprin B 325 Mg Tabs (Aspirin buf(cacarb-mgcarb-mgo)) ..... One daily    Carvedilol 6.25 Mg Tabs (Carvedilol) .Marland Kitchen... 1 two times a day 1    Warfarin Sodium 5 Mg Tabs (Warfarin sodium) .Marland Kitchen... Take daily as directed    Enalapril Maleate 10 Mg Tabs (Enalapril maleate) ..... One two times a day    Lanoxin 0.25 Mg Tabs (Digoxin) ..... One daily  Problem # 4:  ATRIAL FIBRILLATION-PERMANENT (ICD-427.31)  on warfarin. It would be appropriate to think about discontinuing his aspirin  His updated medication list for this problem includes:    Adprin B 325 Mg Tabs (Aspirin buf(cacarb-mgcarb-mgo)) ..... One daily    Carvedilol 6.25 Mg Tabs (Carvedilol) .Marland Kitchen... 1 two times a day 1    Warfarin Sodium 5 Mg Tabs (Warfarin sodium) .Marland Kitchen... Take daily as directed    Lanoxin 0.25 Mg Tabs (Digoxin) ..... One daily  Problem # 5:  HYPERLIPIDEMIA (ICD-272.4)  hHDL remains very low. I think urgent to think about nuts and red wine His updated medication list for this problem includes:    Lipitor 80 Mg Tabs (Atorvastatin calcium) .Marland Kitchen... Take one tablet by mouth daily  as directed  His updated medication list for this problem includes:    Lipitor 80 Mg Tabs (Atorvastatin calcium) .Marland Kitchen... Take one tablet by mouth daily  as directed  Problem # 6:  VENTRICULAR TACHYCARDIA POLYMORPHIC (ICD-427.1)  tpatient had recurrent nonsustained polymorphic ventricular tachycardia. His updated medication list for this problem includes:    Adprin B 325 Mg Tabs (Aspirin buf(cacarb-mgcarb-mgo)) ..... One daily    Carvedilol 6.25 Mg Tabs (Carvedilol) .Marland Kitchen... 1 two times a day 1    Warfarin Sodium 5 Mg Tabs (Warfarin sodium) .Marland Kitchen... Take daily as directed    Enalapril Maleate 10 Mg Tabs (Enalapril  maleate) ..... One two times a day  His updated medication list for this problem includes:    Adprin B 325 Mg Tabs (Aspirin buf(cacarb-mgcarb-mgo)) ..... One daily    Carvedilol 6.25 Mg Tabs (Carvedilol) .Marland Kitchen... 1 two times a day 1    Warfarin Sodium 5 Mg Tabs (Warfarin sodium) .Marland Kitchen... Take daily as directed    Enalapril Maleate 10 Mg Tabs (Enalapril maleate) ..... One two times a day  Patient Instructions: 1)  Your physician has recommended you make the following change in your medication:  CUT DIGOXIN IN HALF  TO 0.125 MG once daily  2)  CARVEDILOL 6.25 MG two times a day  3)  Your physician recommends that you schedule a follow-up appointment in:  3 MONTHS WITH KRISTIN OR PAULA AND 1 YEAR WITH DR Graciela Husbands Prescriptions: CARVEDILOL 6.25 MG TABS (CARVEDILOL) 1 two times a day 1  #180 x 3   Entered by:   Scherrie Bateman, LPN   Authorized by:   Nathen May, MD, [facility]   Signed by:   Scherrie Bateman, LPN on 04/54/0981   Method used:   Electronically to        [facility]* (retail)       [address] [place], [place]  [address]       Ph: [phone]       Fax: [phone]   RxID:   [id number]

## 2011-02-23 NOTE — Cardiovascular Report (Signed)
Summary: Office Visit   Office Visit   Imported By: Roderic Ovens 02/15/2011 15:03:03  _____________________________________________________________________  External Attachment:    Type:   Image     Comment:   External Document

## 2011-03-10 ENCOUNTER — Ambulatory Visit (INDEPENDENT_AMBULATORY_CARE_PROVIDER_SITE_OTHER): Payer: Medicare Other | Admitting: Internal Medicine

## 2011-03-10 DIAGNOSIS — I82409 Acute embolism and thrombosis of unspecified deep veins of unspecified lower extremity: Secondary | ICD-10-CM

## 2011-03-10 NOTE — Patient Instructions (Signed)
Same Dose?

## 2011-03-12 LAB — DIGOXIN LEVEL: Digoxin Level: 0.7 ng/mL — ABNORMAL LOW (ref 0.8–2.0)

## 2011-03-12 LAB — COMPREHENSIVE METABOLIC PANEL
Albumin: 3.6 g/dL (ref 3.5–5.2)
BUN: 14 mg/dL (ref 6–23)
Chloride: 108 mEq/L (ref 96–112)
Creatinine, Ser: 0.92 mg/dL (ref 0.4–1.5)
Total Bilirubin: 1.1 mg/dL (ref 0.3–1.2)
Total Protein: 6.7 g/dL (ref 6.0–8.3)

## 2011-03-12 LAB — DIFFERENTIAL
Basophils Absolute: 0 10*3/uL (ref 0.0–0.1)
Lymphocytes Relative: 11 % — ABNORMAL LOW (ref 12–46)
Monocytes Absolute: 0.8 10*3/uL (ref 0.1–1.0)
Neutro Abs: 8.1 10*3/uL — ABNORMAL HIGH (ref 1.7–7.7)

## 2011-03-12 LAB — POCT CARDIAC MARKERS
CKMB, poc: <1 ng/mL — ABNORMAL LOW (ref 1.0–8.0)
Myoglobin, poc: 70.9 ng/mL (ref 12–200)
Troponin i, poc: <0.05 ng/mL (ref 0.00–0.09)
Troponin i, poc: <0.05 ng/mL (ref 0.00–0.09)

## 2011-03-12 LAB — CBC
HCT: 43.4 % (ref 39.0–52.0)
MCHC: 34.5 g/dL (ref 30.0–36.0)
MCV: 100.5 fL — ABNORMAL HIGH (ref 78.0–100.0)
Platelets: 156 10*3/uL (ref 150–400)
RDW: 13.5 % (ref 11.5–15.5)

## 2011-04-13 ENCOUNTER — Other Ambulatory Visit: Payer: Self-pay | Admitting: Internal Medicine

## 2011-04-15 ENCOUNTER — Other Ambulatory Visit (INDEPENDENT_AMBULATORY_CARE_PROVIDER_SITE_OTHER): Payer: Medicare Other | Admitting: Internal Medicine

## 2011-04-15 DIAGNOSIS — IMO0001 Reserved for inherently not codable concepts without codable children: Secondary | ICD-10-CM

## 2011-04-15 DIAGNOSIS — E785 Hyperlipidemia, unspecified: Secondary | ICD-10-CM

## 2011-04-15 DIAGNOSIS — I82409 Acute embolism and thrombosis of unspecified deep veins of unspecified lower extremity: Secondary | ICD-10-CM

## 2011-04-15 DIAGNOSIS — I10 Essential (primary) hypertension: Secondary | ICD-10-CM

## 2011-04-15 LAB — HEPATIC FUNCTION PANEL
Alkaline Phosphatase: 60 U/L (ref 39–117)
Bilirubin, Direct: 0.2 mg/dL (ref 0.0–0.3)
Total Bilirubin: 1 mg/dL (ref 0.3–1.2)

## 2011-04-15 LAB — LIPID PANEL
HDL: 35.7 mg/dL — ABNORMAL LOW (ref >39.00)
LDL Cholesterol: 74 mg/dL (ref 0–99)
Total CHOL/HDL Ratio: 3
Triglycerides: 48 mg/dL (ref 0.0–149.0)
VLDL: 9.6 mg/dL (ref 0.0–40.0)

## 2011-04-15 LAB — BASIC METABOLIC PANEL
CO2: 30 mEq/L (ref 19–32)
Calcium: 8.9 mg/dL (ref 8.4–10.5)
GFR: 105.33 mL/min (ref >60.00)
Sodium: 142 mEq/L (ref 135–145)

## 2011-04-15 LAB — POCT INR: INR: 2.2

## 2011-04-15 NOTE — Patient Instructions (Signed)
Same Dose?

## 2011-04-17 ENCOUNTER — Other Ambulatory Visit: Payer: Self-pay | Admitting: Internal Medicine

## 2011-04-20 NOTE — Assessment & Plan Note (Signed)
[facility]                         ELECTROPHYSIOLOGY OFFICE NOTE   Carl Wood, Carl Wood                    MRN:          [id number]  DATE:07/11/2008                            DOB:          [birth year]    ALLERGIES:  Carl Wood has allergies to PENICILLIN and SULFA.   ELECTROPHYSIOLOGIST:  Dr. [name].   PRIMARY CARE Carl Wood:  Dr. Birdie Sons.   PRESENTING CIRCUMSTANCE:  I am here because my device was beeping the  other day.   BRIEF HISTORY:  Carl Wood is a 70 year old male.  He had his first  heart attack at age 76 in 24.  He has an ischemic cardiomyopathy.  His  ejection fraction in October 2003 was 28% with no ischemia at the time  of a stress test.  However, this was down from 41% demonstrated in a  study in 1998.  The Myoview study also showed extensive scarring.  Several years after his heart attack, the patient experienced  polymorphic ventricular tachycardia for which the first of several  cardioverter defibrillators was implanted.  His last device was  implanted in July 2001, and it is now at [facility].  It is a Medtronic Gem III  VR device.  It was implanted in the right side.  It was done by another  practice.  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  His original device was on  the left and before that his device was abdominally placed.   On July 01, 2008, his device started beeping at dinner.  He called our  office and they recommended that he have an interrogation.  It showed  that it was at [facility] with about 2 months in reserve.  The patient has  cardiac risk factors of diabetes, which he has controlled significantly  with a 35-40 pound weight loss and with diet.  He does not take any  diabetic medications.  He does continue to smoke one-half pack per day.  He also has a history of hypertension.  He has an atrial arrhythmia,  atrial fibrillation, and he is on  chronic Coumadin therapy.  Apparently,  he has been cardioverted in the past, but this did not take.  He has  never had antiarrhythmic therapy that the patient could tell me about.   PAST MEDICAL HISTORY:  1. GERD.  2. Peptic ulcer disease.  3. Dyslipidemia.  4. Nephrolithiasis.  He is currently fighting, what he describes as      kidney infection from multiple kidney stones as well as a ureteral      stone.  He just completed a course of Cipro antibiotic and will see      his urologist a week after this August 12.  He also has a history      of an anal fistula.  5. He is status post cholecystectomy.  6. He has had both back and knee surgeries and has hemorrhoids.   MEDICATIONS:  1. Coreg 25 mg twice daily.  2. Lipitor 40 mg every other day at bedtime.  3.  Lanoxin 0.25 mg daily.  4. Enalapril 10 mg twice daily.  5. Enteric coated aspirin 325 mg daily.  6. Coumadin 5/5/2.5 taking in series.   The patient appears today.  He is without chest pain.  He does not get  short of breath.  He and his wife can walk a mile.  He does state that  with exertional exercise such as chopping wood, using a chain saw, or  mowing, he can get tired after a 1-2 hour period.  He does not have  chest pain with this nor does he get short of breath.  He has [place]  Heart Association Class is II chronic systolic congestive heart failure.   PHYSICAL EXAMINATION:  VITAL SIGNS:  Stable.  Blood pressure 120/66.  His weight is 195.  GENERAL:  He is alert and oriented x3 in no acute distress.  LUNGS:  Clear to auscultation bilaterally.  HEART:  Mildly irregular, but the rhythm is controlled.  Electrogram  shows a narrow, complex atrial fibrillation with a rate of 57.  His QT  as estimated by the device is 460.  ABDOMEN:  Soft and nondistended.  Bowel sounds are present.  The patient  is not obese.  EXTREMITIES:  He does not have lower extremity edema bilaterally.   IMPRESSION:  Ischemic cardiomyopathy with  myocardial infarction at age  50, and ejection fraction at the last Myoview in October 2003 of 28%,  but no ischemia.  The patient since that time has been on Coreg in ever  escalating doses until he has reached 25 mg twice daily.  He has  polymorphic ventricular arrhythmias, and he has had several implantable  cardioverter-defibrillators.  He now needs generator change.   PLAN:  the patient will have his generator  changed on July 22, 2008.  He will have laboratory studies drawn prior to that here at [facility] and on admission to [facility]-Stay [facility] at 5:30 in the morning.  On  July 22, 2008, he will have another stat pro-time, INR.  The generator  change out will be placed on the right, unless [name] feels that it  is possible to adapt this device to a left-sided placement.  The patient  himself would prefer just to keep it as it is because he is quite happy  with the fact that his device was not discharged in the last 8 years  since he has had it.  The patient will also have a chest x-ray since he  has not had one in the last  30 days and does have a pronounced smoker's cough and does have ongoing  tobacco habituation.     [name], PA  Electronically Signed      Duke Salvia, MD, [facility]  Electronically Signed   GM/MedQ  DD: 07/11/2008  DT: 07/12/2008  Job #: [id number]

## 2011-04-20 NOTE — Assessment & Plan Note (Signed)
[facility]                         ELECTROPHYSIOLOGY OFFICE NOTE   GEOFFREY, HYNES                    MRN:          [id number]  DATE:11/12/2008                            DOB:          1941/12/04    Mr. Carl Wood is seen in followup today of ventricular tachycardia  occurring in the setting of ischemic heart disease.  He has had no  complaints of chest pain or shortness of breath.  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   MEDICATIONS:  1. Aspirin 325.  2. Coumadin.  3. Lipitor 40.  4. Lanoxin 0.25.  5. Enalapril 10 b.i.d.  6. Coreg 25 b.i.d.   PHYSICAL EXAMINATION:  VITAL SIGNS:  His blood pressure was 114/70, his  pulse was 60, and his weight was 199, which is stable.  LUNGS:  Clear.  HEART:  Sounds are regular with a 2/6 murmur.  ABDOMEN:  Soft.  EXTREMITIES:  No edema.   Interrogation of his Medtronic Virtuoso ICD changed on July 22, 2008,  on the right side demonstrates an amplitude of 7.4 with impedance of  456.  The threshold was 2 volts at 0.4.  Battery voltage was 3.21.  There are no intercurrent episodes.   IMPRESSION:  1. Atrial fibrillation - Permanent.  2. Ventricular tachycardia - Polymorphic and monomorphic.  3. Status post implantable cardioverter-defibrillator for the above      with a number of complications requiring operation.  4. Ischemic heart disease with:      a.     Prior bypass grafting.      b.     Depressed left ventricular function.   Mr. Krygier is actually doing very-very well at this point.  His  coronary artery disease appears to be stable.  As this is chronic  systolic heart failure, we will see him again in 6 months' time.     Duke Salvia, MD, [facility]  Electronically Signed    SCK/MedQ  DD: 11/12/2008  DT: 11/13/2008  Job #: [id number]

## 2011-04-20 NOTE — Assessment & Plan Note (Signed)
[facility]                         ELECTROPHYSIOLOGY OFFICE NOTE   Carl Wood, Carl Wood                    MRN:          [id number]  DATE:07/11/2008                            DOB:          1941/03/10    ADDENDUM   This is a just a wrap-up of the history and physical that I just did on  July 11, 2008, pertaining to a patient who presents for generator  change on July 22, 2008, at [facility], [place].  The patient's  last real cardiac invasive workup was a Myoview study in October 2003.  Now, clinically, the patient has done very well, and he has been on  Coreg 25 mg twice daily but may be time for a followup echocardiogram.  I will talk it over to [name] when we see him for the generator  change on July 22, 2008.      [name], PA  Electronically Signed      Duke Salvia, MD, [facility]  Electronically Signed   GM/MedQ  DD: 07/11/2008  DT: 07/12/2008  Job #: [id number]

## 2011-04-20 NOTE — Assessment & Plan Note (Signed)
[facility]                         ELECTROPHYSIOLOGY OFFICE NOTE   CARRY, [name]                    MRN:          [id number]  DATE:02/13/2008                            DOB:          September 15, 1941    Carl Wood has a history of ischemic heart disease with prior bypass  grafting, depressed LV function.  He has no complaints of chest pain or  shortness of breath and continues to be very active.  He also has a  history of ventricular tachycardia.  This has been quiescent.   Medications currently include aspirin, Coumadin, Lipitor, Lanoxin,  enalapril and Coreg.   On examination his blood pressure is 102/60, his pulse was 68 and  regular.  Lungs were clear.  HEART:  Sounds were regular.  Extremities  were without edema.   Interrogation of his Gem ICD demonstrates an R-wave of nine with  impedance of 444,  threshold of 2 volts of 0.2.  Battery voltage was  2.58.  charge time was 10.19.   IMPRESSION:  1. Ischemic heart disease      a.     Prior bypass grafting      b.     Depressed LV function.  2. Ventricular tachycardia.  3. Status post ICD for the above.   Carl Wood is doing quite well.  At this point we will continue on his  current medications and see him again three months' time.     Duke Salvia, MD, [facility]  Electronically Signed    SCK/MedQ  DD: 02/13/2008  DT: 02/14/2008  Job #: [id number]

## 2011-04-20 NOTE — Assessment & Plan Note (Signed)
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  He does have some intermittent chest discomforts that are  postprandial or at rest.  They are unassociated with exertion.   He denies lightheadedness __________ .   His medications are unchanged.  They include Coumadin, aspirin, Lipitor,  Lanoxin 0.25, enalapril 10 b.i.d., and Coreg 25 b.i.d.   EXAMINATION:  His blood pressure today was 88/58.  LUNGS:  Clear.  HEART SOUNDS:  Regular.  EXTREMITIES:  Without edema.  SKIN:  Warm and dry with good capillary refill.   Interrogation of his Medtronic ICD demonstrates an R wave of 6 with  impedance of 400, a threshold 2 volts at 0.2.  The battery voltage  remains 2.58.  He is 100% V-sensed.  Discharged x10.2.   IMPRESSION:  1. Ventricular tachycardia.  2. Status post previously implanted ICD with complications requiring      explantation and reimplantation.  3. Ishemic heart disease.      a.     Status post bypass grafting.      b.     Depressed left ventricular function.      c.     Last Myoview 2003.  4. Nonexertional chest discomfort.   Carl Wood will be following up with Dr. Cato Mulligan.  He is stable, I  think, from a cardiac point of view at this point.  We will see him  again in 2 month's time in the device clinic.     Duke Salvia, MD, [facility]  Electronically Signed    SCK/MedQ  DD: 08/29/2007  DT: 08/29/2007  Job #: [id number]

## 2011-04-20 NOTE — Assessment & Plan Note (Signed)
[facility]                                 ON-CALL NOTE   GABERIAL, CADA                    MRN:          [id number]  DATE:07/01/2008                            DOB:          [birth year]    PRIMARY CARDIOLOGIST:  Duke Salvia, MD, [facility]   Mr. Zielke called this evening because he wanted to make sure his  device was okay.  He has a Medtronic ICD and stated he was sitting at  the dinner table tonight when he heard it start to beep.  He has not had  any shortness of breath or chest pain.  His device has not fired.  He is  not having any unusual symptoms at all, but wanted to make sure that  there was nothing wrong with it.  He stated that at his last visit in  June, he was told that its battery was getting low.   I contacted the Medtronic rep and was advised that the reasons for his  device to beep include low battery, multiple discharges, and lead  fracture.  His lead and his device are not on any kind of recall or  watch list.  Mr. Spratling assured me that his device had not fired.  Therefore, it appears that his battery is getting low and a message has  been left with the office to get him in tomorrow for device check.  Mr.  Topper is advised to contact us if he has any symptoms or other  concerns.      Theodore Demark, PA-C  Electronically Signed      Rollene Rotunda, MD, [facility]  Electronically Signed   RB/MedQ  DD: 07/01/2008  DT: 07/02/2008  Job #: [id number]

## 2011-04-20 NOTE — Discharge Summary (Signed)
NAMEMarland Wood  BRECK, [place] NO.:  [id number]   MEDICAL RECORD NO.:  [id number]          PATIENT TYPE:  INP   LOCATION:  2899                         FACILITY:  [facility]   PHYSICIAN:  Duke Salvia, MD, FACCDATE OF BIRTH:  Oct 19, 1941   DATE OF ADMISSION:  07/22/2008  DATE OF DISCHARGE:  07/22/2008                               DISCHARGE SUMMARY   The patient has allergies to PENICILLIN and SULFA.   FINAL DIAGNOSES:  1. Cardioverter defibrillator for ischemic cardiomyopathy, now at      defibrillation response interval.  2. Implantable cardioverter-defibrillator generator change with      implant of Medtronic Virtuoso single chamber cardioverter-      defibrillator.  Defibrillator threshold study less than or equal to      25 joules.      a.     The patient cardioverted from atrial fibrillation to sinus       rhythm at defibrillator threshold study.  The patient had       reinduction of atrial fibrillation with a left femoral catheter       placement.   SECONDARY DIAGNOSES:  1. Ischemic cardiomyopathy, first myocardial infarction, age 70.  2. Ejection fraction at [facility] study on September 28, 2007, 28%, no      ischemia.  3. Polymorphic ventricular tachycardia.  4. Gastroesophageal reflux disease.  5. Peptic ulcer disease.  6. Dyslipidemia.  7. History of nephrolithiasis.  8. Status post cholecystectomy.  9. Status post both back and knee surgeries.  10.Atrial fibrillation, chronic Coumadin   PROCEDURE:  On July 22, 2008, generator change for cardioverter-  defibrillator with sinus rhythm in place after defibrillator threshold  study, atrial fibrillation reinduced by way of left femoral catheter.   HOSPITAL COURSE:  The patient presents electively July 22, 2008.  He  underwent explantation of his existing cardioverter-defibrillator  generator with implantation of a new Medtronic Virtuoso model.  Once  again upon defibrillator threshold study, he was  converted to sinus  rhythm.  He needed reinduction of his atrial fibrillation.  The patient  remains in AFib on Coumadin.  He is asked to remove the bandage over his  incision on Tuesday, July 23, 2008, and leave the incision open to the  air as to keep his incision dry for the next 7 days and to sponge bathe  until Monday, July 29, 2008.   MEDICATIONS AT DISCHARGE.:  1. Coreg 25 mg twice daily.  2. Lipitor 40 mg every other day.  3. Lanoxin 0.25 mg daily.  4. Enalapril 10 mg twice daily.  5. Enteric-coated aspirin 325 mg daily.  6. Coumadin as before this admission.  7. Niacin daily.  8. Fish oil daily.  9. Flomax 0.4 mg daily.   He follows up with [facility] at [address] at the  ICD Clinic, Wednesday August 07, 2008, at 9:40.   LABORATORY STUDIES:  Pertinent to this admission drawn on July 17, 2008, white cell 6.4, hemoglobin 15.6, hematocrit 43.7, and platelets  are 159.  Protime 15.5 and INR 1.3.  Sodium is 138, potassium  4.5,  chloride 106, bicarbonate 28, glucose 124, BUN is 24, creatinine is 0.9,  and his protime on day of admission is 21.4, and INR 1.8.  Chest x-ray  taken today on July 22, 2008, shows no evidence of infiltrates or  pulmonary nodules.      Carl Wood, [place]      Duke Salvia, MD, [facility]  Electronically Signed    GM/MEDQ  D:  07/22/2008  T:  07/23/2008  Job:  [id number]   cc:   Valetta Mole. Swords, MD

## 2011-04-20 NOTE — Assessment & Plan Note (Signed)
[name]                         ELECTROPHYSIOLOGY OFFICE NOTE   STEFFEN, HASE                    MRN:          [id number]  DATE:05/09/2007                            DOB:          10-Sep-1941    REASON FOR VISIT:  Carl Wood is seen today.  He has some complaints of  shortness of breath with exertion.  He denies chest pain.   MEDICATIONS:  His medications currently include:  1. Coumadin.  2. Aspirin 325 mg.  3. Lipitor.  4. Lanoxin 0.25.  5. Enalapril 10 b.i.d.  6. Coreg 25.   PHYSICAL EXAMINATION:  VITAL SIGNS:  On examination his blood pressure  is 100/63 with pulse of 69.  LUNGS:  Clear.  HEART:  Sounds were irregular.  EXTREMITIES:  Had trace edema.   CLINICAL DATA:  Electrocardiogram dated today demonstrated atrial  fibrillation at 57 with intervals of __________ 0.13/0.46 with an axis  that was leftward at -30.   Interrogation of his device demonstrated Medtronic 7231, implant date of  July 2001 with R wave of 9 with impedence of 444, threshold of 2 volts  at 0.2 milliseconds.  Battery voltage 2.58.  There are no intercurrent  therapies.   IMPRESSION:  1. Ventricular tachycardia with appropriate therapies.  2. Status post ICD with multiple complications including lead      fractures, infection and subsequent removal.  3. Atrial fibrillation - permanent.  4. Congestive heart failure - chronic.   PLAN:  Mr. Duhe is stable.  We will plan when we see him next to add  ERI, to discuss upgrading his device to a CRT device.     Duke Salvia, MD, [facility]  Electronically Signed    SCK/MedQ  DD: 05/09/2007  DT: 05/09/2007  Job #: [id number]

## 2011-04-22 ENCOUNTER — Encounter: Payer: Self-pay | Admitting: Internal Medicine

## 2011-04-23 ENCOUNTER — Ambulatory Visit (INDEPENDENT_AMBULATORY_CARE_PROVIDER_SITE_OTHER): Payer: Medicare Other | Admitting: Internal Medicine

## 2011-04-23 ENCOUNTER — Encounter: Payer: Self-pay | Admitting: Internal Medicine

## 2011-04-23 DIAGNOSIS — E119 Type 2 diabetes mellitus without complications: Secondary | ICD-10-CM

## 2011-04-23 DIAGNOSIS — I509 Heart failure, unspecified: Secondary | ICD-10-CM

## 2011-04-23 DIAGNOSIS — I2510 Atherosclerotic heart disease of native coronary artery without angina pectoris: Secondary | ICD-10-CM

## 2011-04-23 DIAGNOSIS — E785 Hyperlipidemia, unspecified: Secondary | ICD-10-CM

## 2011-04-23 DIAGNOSIS — I10 Essential (primary) hypertension: Secondary | ICD-10-CM

## 2011-04-23 NOTE — Discharge Summary (Signed)
[place]  Patient:    Carl Wood, Carl Wood Visit Number: [id number] MRN: [id number]          Service Type: MED Location: [phone] Attending Physician:  Nathen May Dictated by:   Chinita Pester, N.P. Admit Date:  03/02/2002 Disc. Date: 03/07/02   CC:         [name], M.D. [facility])  Dr. Hermelinda Medicus   Discharge Summary  PRIMARY DIAGNOSIS: Atrial tachycardia.  SECONDARY DIAGNOSES: VT status post ICD.  HISTORY OF PRESENT ILLNESS: The patient had multiple ICD discharges with what appeared to be atrial flutter, ischemic heart disease with an EF of 30%, negative Cardiolite 2001, multiple mechanical ICD failures and lead fracture, upper extremity DDT with pulmonary emboli, post ICD implant on chronic Coumadin therapy.  HOSPITAL COURSE: This is a 70 year old gentleman of Dr. Delia Chimes and Dr. Koren Bound with documented severe ischemic cardiomyopathy, status post ICD with multiple subsequent revisions on chronic Coumadin for history of pulmonary embolism, after initial ICD implant, who presented to the emergency room following ICD discharges.  The patient was admitted for an EP study, possible radiofrequency catheter ablation.  The patient underwent procedure and he was noted to have multiple atrial tachycardias and flutters in the left atrium. He was then  DC cardioverted to a normal sinus rhythm and placed on Tikosyn therapy.  The patient was started on Tikosyn.  On March 06, 2002 the patient had a 12 beat run of V tach on a monitor with some bigeminy.  Tikosyn was discontinued.  The patient was noted to have bradycardia.  He was started on pindolol and a VT detection rate was reprogrammed on his pacemaker.  The patient was to have reprogrammed device to 1 zone.  Programming was go be done prior to discharge. He was to be discharged to home on the following medications.  DISCHARGE MEDICATIONS: 1. Digoxin 0.25 mg daily. 2. Vasotec 10 mg  bid. 3. Lipitor 10 mg daily. 4. Coumadin 5 mg daily except for Monday and Thursday, 7.5. 5. Coated aspirin 325 mg daily. 6. Pindolol 5 mg q.12h.  DIET: Low fat, low cholesterol, low salt diet.  FOLLOW-UP: He is to have a PT and INR drawn on Friday.  He is to follow-up with [name] on Apr 23, 2002 at 10:30 am and prior to discharge, the patient device was to be reprogrammed as per Dr. Koren Bound instructions with Medtronic. Dictated by:   Chinita Pester, N.P. Attending Physician:  Nathen May DD:  03/07/02 TD:  03/07/02 Job: [id number]

## 2011-04-23 NOTE — Discharge Summary (Signed)
[place]. [facility]  Patient:    Carl Wood, Carl Wood                    MRN: [id number] Adm. Date:  [id number] Disc. Date: 06/28/00 Attending:  Lenoria Farrier Dictator:   Rozell Searing, P.A. CCValetta Mole. Swords, M.D. [facility]             Bruce R. Juanda Chance, M.D. [facility]             Nathen May, M.D., [facility]                  Referring Physician Discharge [place]  PROCEDURES: 1. ICD interrogation. 2. ICD generator replacement June 27, 2000.  REASON FOR ADMISSION:  Carl Wood is a 70 year old male patient of Dr. Ephraim Hamburger and Dr. Odessa Fleming with documented severe ischemic cardiomyopathy, status post ICD with multiple subsequent revisions, on chronic Coumadin for history of pulmonary embolus (after initial ICD placement), who presented to the emergency room following ICD discharge.  LABORATORY DATA:  INR 3.3 on admission.  Cardiac enzymes negative.  Potassium and renal function within normal limits.  Glucose mildly elevated.  Digoxin level low normal.  Hemoglobin 15 and hematocrit 45.  LFTs normal.  Urinalysis: Moderate hemoglobin, protein 30, rare bacteria, 0-5 wbcs, negative nitrites/leukocyte esterase.  HOSPITAL COURSE:  Patient ruled out for MI with negative serial cardiac enzymes.  An adenosine Cardiolite performed June 22, 2000 revealed no myocardial ischemia, extensive infarction involving anterior/apical/septal/inferoapcial wall, calculated EF 28%.  ICD device was interrogated by Dr. Rosette Reveal.  This revealed ventricular fibrillation with appropriate ICD discharge.  However, Dr. Ladona Ridgel noted that the charge time on the device was 30 seconds.  The Medtronics representative subsequently checked the ICD device, noting successful defibrillation of the ventricular fibrillation and two episodes of ventricular tachycardia, which were successfully atrial-paced.  He noted a charge time of 36 seconds. Results were reviewed with Dr. Ladona Ridgel.  Dr. Juanda Chance  also indicated that the indication of ventricular fibrillation some eight years out could represent CAD progression.  Thus, he ordered an echocardiogram and Cardiolite. Cardiolite results noted above.  Echocardiogram:  EF 25% with akinesis of septum and entire apex and poor visualization of the remaining walls.  Of note, a layered clot in the OV apex could not be ruled out, but no definite moving thrombus was seen.  Following discontinuation of Coumadin and trending down of INR, plan was to proceed with ICD generator replacement.  Lovenox was employed in the interim.  On July 23, Dr. Ladona Ridgel proceed with explantation of old ICD device and insertion of Medtronic [id number] device.  No complications were noted and Coumadin was resumed later that evening.  Patient was cleared for discharge the following morning in stable condition.  Patient would be placed on a five-day course of Keflex.  No other medication adjustments were made.  Recommendation is for early pro time follow-up.  DISCHARGE MEDICATIONS: 1. Coumadin 5 mg q.d. 2. Atenolol 50 mg q.d. 3. Lanoxin 0.25 mg q.d. 4. Lipitor 10 mg q.d. 5. Vasotec 10 mg q.d.  INSTRUCTIONS:  As outlined per Dr. Ladona Ridgel.  WOUND CARE:  Patient is to call the office if there is any swelling/bleeding from the wound site.  FOLLOW-UP:  Patient is scheduled for a follow-up pro time at [facility] on Thursday (July 26) at 3:45 p.m.  Patient will follow up with Dr. Rosette Reveal in  two weeks for wound check.  DISCHARGE DIAGNOSES: 1. Implantable cardioverter-defibrilator discharge secondary to ventricular    fibrillation.    a. Status post implantable cardioverter-defibrilator interrogation.    b. Status post implantable cardioverter-defibrilator generator       replacement secondary to end of life, June 27, 2000. 2. Severe ischemic cardiomyopathy.    Nonischemic adenosine Cardiolite:  Ejection fraction 28% June 22, 2000. 3. Chronic Coumadin.     a. History of pulmonary embolus (status post implantable       cardioverter-defibrilator).    b. Inferoapical akinesis. DD:  06/28/00 TD:  06/28/00 Job: [id number]

## 2011-04-23 NOTE — Assessment & Plan Note (Signed)
Controlled Continue meds 

## 2011-04-23 NOTE — Consult Note (Signed)
[place]. [facility]  Patient:    ZYMIRE, TURNBO Visit Number: [id number] MRN: [id number]          Service Type: EMS Location: [place] Attending Physician:  Doug Sou Dictated by:   Nathen May, M.D., [facility] Proc. Date: 02/05/02 Admit Date:  02/05/2002   CC:         Bruce H. Swords, M.D. [facility]  EPS Lab  [facility]   Consultation Report  REASON FOR CONSULTATION:  I had the privilege of seeing the patient in the emergency room following multiple defibrillator discharges.  HISTORY OF PRESENT ILLNESS:  He is a 70 year old gentleman with a history of coronary artery disease status post MI with multiple infarctions prior to intervention and medical therapy since his last cath in 1999. His EF is 30 to 35%.  He has a history of ventricular tachycardia for which he underwent fibrillator implantation by Dr. Hoy Finlay in [place] prior to my coming to [place]. This was complicated by an upper extremity DVT with pulmonary embolism, and then when I first met him he had had a fractured lead requiring a revision with subsequent device failure from a lead problem either over sensing or again a fracture and he went to [facility] for an extraction with a reimplantation.  Most recently in July of 2001 he had an episode of VF with appropriate ICD therapy; however, prolonged charge time was detected and the device underwent revision.  Cardiolite scanning at that time demonstrated no significant abnormalities and he was subsequently discharged. I saw him recently apparently and thought that his sinus rate was too slow. He was inclined to sleep. He was sluggish and we decreased his beta blocker from 50 to 25 mg twice daily.  Since that time, once on December 30th without symptoms, and then today he has had multiple episodes of a tachycardia which appears to be supraventricular in origin based on electrogram morphology both in the  near field as well as far field. In December it was terminated on each attempt by antitachycardia pacing and was quite irregular prior. Todays episodes seemed to originate in sinus tachycardia with a cycle length of 500 with an irregularity regularization therapies, and then multiple episodes of therapy. On arrival to the emergency room, he was still in a narrow QRS tachycardia with what appear evidently V1 to be discernible flutter waves at a rate of 200 msec corresponding to the 2 to 1 rate and tachycardia at 300 to 310 msec. He received multiple defibrillator discharges.  CURRENT MEDICATIONS: 1. Lipitor 10 mg. 2. Coumadin. 3. Lopressor 25 mg b.i.d. 4. Vasotec 10 mg. 5. Lanoxin 0.25 mg.  ALLERGIES:  PENICILLIN and SULFA.  SOCIAL HISTORY:  Noncontributory but he is married. He is about ready to have his next grandchild. He continues to smoke intermittently.  REVIEW OF SYSTEMS:  Noncontributory.  PHYSICAL EXAMINATION:  GENERAL:  He is well-developed and well-nourished. In no acute distress.  VITAL SIGNS:  Blood pressure 119/64, pulse 59.  NECK:  Neck veins were flat. His carotids were full bilaterally without bruits.  HEART:  Heart sounds regular without murmurs or gallops.  ABDOMEN:  Soft with active bowel sounds, without midline pulsation or hepatomegaly.  EXTREMITIES:  Femoral pulses were 2+. Distal pulses were intact. There is no clubbing, cyanosis, or edema.  NEUROLOGICAL:  Grossly normal. There is no musculoskeletal abnormalities.  DIAGNOSTIC DATA:  Electrocardiogram is as noted with a narrow QRS tachycardia with what appear to be distinct  flutter waves in lead V1 at a cycle length of 220 msec to 240 msec.  Interrogation with defibrillator demonstrates an intact high energy impedance at 68 ohms with appropriate charge times and multiple episodes of tachycardia as outlined above.  IMPRESSION: 1. Multiple ICD discharges associated with what appears to be atrial  flutter    much less likely atrial tachycardia based on the 12-lead from February 05, 2002    at 1401 hours. 2. Prior ventricular tachycardia/ventricular fibrillation with appropriate ICD    discharges. 3. Status post ICD Medtronics for #2. 4. Ischemic heart disease with:    a. Ejection fraction of 30%.    b. Percutaneous coronary intervention.    c. Negative Cardiolite in July 2001. 5. Multiple mechanical ICD failures and lead fractures. 6. Upper extremity deep venous thrombosis with pulmonary embolism post ICD    implant on chronic Coumadin therapy.  DISCUSSION:  The patient had inappropriate ICD therapy from atrial arrhythmias, probably atrial flutter with 2 to 1 conduction resulting in detections faster than his rate. I have reprogrammed his defibrillator to increase the NID to 28 and 16 as well as I have activated the ventricular EGM with criteria to 84 msec which included 100% of 58 beats.  PLAN:  We will plan to see what his VT rates have been as an outpatient, and ultimately we will consider taking him to the lab for EP study and ablation of the supraventricular arrhythmic substrate.  Other labs are not notably abnormal at this time. Dictated by:   Nathen May, M.D., [facility] Attending Physician:  Doug Sou DD:  02/05/02 TD:  02/06/02 Job: [id number]

## 2011-04-23 NOTE — Progress Notes (Signed)
  Subjective:    Patient ID: Carl Wood, male    DOB: [birth year], 70 y.o.   MRN: [id number]  HPI   patient comes in for followup of multiple medical problems including type 2 diabetes, hyperlipidemia, hypertension. The patient does not check blood sugar or blood pressure at home. The patetient does not follow an exercise or diet program. The patient denies any polyuria, polydipsia.  In the past the patient has gone to diabetic treatment center. The patient is tolerating medications  Without difficulty. The patient does admit to medication compliance.  Past Medical History  Diagnosis Date  . PE (pulmonary embolism)   . Colon polyps   . CHF (congestive heart failure)   . CAD (coronary artery disease)   . Diabetes mellitus   . Diverticulosis of colon   . Hyperlipidemia   . Hypertension   . MI (mitral incompetence)   . Nephrolithiasis   . V-tach    Past Surgical History  Procedure Date  . Back surgery   . Cardiac defibrillator placement     medtronic virtuoso  . Basal cell carcinoma excision     nose  . Knee arthroplasty     reports that he has been smoking Cigarettes.  He has been smoking about .5 packs per day. He does not have any smokeless tobacco history on file. His alcohol and drug histories not on file. family history is not on file. Allergies  Allergen Reactions  . Penicillins     REACTION: unspecified  . Sulfamethoxazole     REACTION: unspecified     Review of Systems  patient denies chest pain, shortness of breath, orthopnea. Denies lower extremity edema, abdominal pain, change in appetite, change in bowel movements. Patient denies rashes, musculoskeletal complaints. No other specific complaints in a complete review of systems.      Objective:   Physical Exam  well-developed well-nourished male in no acute distress. HEENT exam atraumatic, normocephalic, neck supple without jugular venous distention. Chest clear to auscultation cardiac exam S1-S2 are regular.  Abdominal exam overweight with bowel sounds, soft and nontender. Extremities no edema. Neurologic exam is alert with a normal gait.        Assessment & Plan:

## 2011-04-23 NOTE — Procedures (Signed)
[place]. [facility]  Patient:    Carl Wood, Carl Wood                    MRN: [id number] Proc. Date: 06/27/00 Adm. Date:  [id number] Attending:  Lenoria Farrier CC:         Everardo Beals. Juanda Chance, M.D. [facility]             Bruce H. Swords, M.D. [facility]             Kathrine Cords, R.N., [facility]             Delsa Grana, R.N., [facility]             Nathen May, M.D., [facility]                           Procedure Report  PROCEDURE PERFORMED:  ICD generator removal and reinsertion of a new ICD generator, followed by electrophysiologic evaluation of the defibrillator, including defibrillation threshold testing.  I. INTRODUCTION:  The patient is a 70 year old man, who has a long history of coronary disease, status post MI with an ischemic cardiomyopathy.  He had VT several years ago and has had multiple problems with multiple defibrillators, but had done well for approximately two years.  He was admitted to the hospital after an ICD discharge.  Interrogation of the device subsequently demonstrated a markedly prolonged charge time with the patient being in VF for approximately 30 seconds.  For this reason, he was brought to the electrophysiology laboratory for ICD generator revision.  II. PROCEDURE:  After informed consent was obtained, the patient was taken to the diagnostic electrophysiology laboratory in the fasting state. After the usual preparation and draping, intravenous fentanyl and midazolam were given for sedation.  A total of 30 cc of lidocaine was infiltrated on the left infraclavicular region.  A 12 cm incision was carried out over this region and electrocautery was utilized to dissect down to the ICD generator.  After freeing up multiple adhesions, the generator was subsequently removed.  At this point, attempts were made to free up the defibrillation lead and rate sensing lead and this was somewhat difficult by the patients dense  adhesions. Enough of the lead was eventually freed up and after kanamycin irrigation was irrigated into the wound and the wound was cauterized partially reducing the scar, the device was connected to the defibrillation lead.  I should note that prior to this, testing of the device demonstrated R-waves of 13 mV and a ventricular pacing threshold of 1.5 volts at 0.5 msec.  The ventricular pacing impedance was 669 ohms through the analyzer.  After the device was connected to the generator and subsequently secured, it was placed back in the subcutaneous pocket.  Additional kanamycin irrigation was utilized to irrigate the wound.  Defibrillation threshold testing was then carried out.  The initial DFT test utilized a 20 joule shock.  T-wave shock was found to induce VF, but this spontaneously reverted to sinus rhythm.  This occurred a second time and after this a 50 hertz burst pacing was utilized to initiate ventricular fibrillation.  A 20 joule shock successfully restored sinus rhythm.  At this point, no additional DFT testing was carried out.  The patient had additional kanamycin irrigation utilized to irrigate the wound and the wound was then closed with a layer of 2-0 Vicryl, followed by a layer of 3-0 Vicryl, followed by a  layer of 4-0 Vicryl to close the subcuticular tissues.  Benzoin was painted on the skin and Steri-Strips were applied and a pressure dressing placed and held.  The patient was returned to his room in good condition.  III. COMPLICATIONS:  None.  IV. RESULTS:  This demonstrated successful explantation of an old Medtronics defibrillator and implantation of a new Gem III VR.  The patients defibrillation lead was satisfactory, had satisfactory pacing and sensing thresholds.  In addition, the DFT through the new device was less than or equal to 20 joules. DD:  06/27/00 TD:  06/27/00 Job: [id number]

## 2011-04-23 NOTE — Assessment & Plan Note (Signed)
Controlled Continue current meds 

## 2011-04-23 NOTE — Op Note (Signed)
   NAME:  Carl Wood, Carl Wood NO.:  [id number]   MEDICAL RECORD NO.:  [id number]                    PATIENT TYPE:   LOCATION:                                       FACILITY:   PHYSICIAN:  Duke Salvia, M.D. [facility]           DATE OF BIRTH:  January 24, 1941   DATE OF PROCEDURE:  03/02/2002  DATE OF DISCHARGE:                                 OPERATIVE REPORT   PREOPERATIVE DIAGNOSES:  Atrial fibrillation and atrial flutter.   POSTOPERATIVE DIAGNOSES:  Multiple atrial tachycardias and atrial flutters  originating from the left atrium.   PROCEDURE PERFORMED:  Invasive electrophysiologic study with electrical  anatomical mapping.   DESCRIPTION OF PROCEDURE:  Following obtaining informed consent, the patient  was brought to the electrophysiology laboratory and placed no the  fluoroscopic table in the supine position.  After routine prep and drape, a  cardiac catheterization was performed with local anesthesia and conscious  sedation.  Noninvasive blood pressure monitoring, transcutaneous oxygen  saturation monitoring and end tidal CO2 monitoring were performed  continuously throughout the procedure.  Following the procedure the  catheters were removed and hemostasis obtained and the patient was  transferred to the floor in stable condition.   Catheters:  A 5 French quadripolar catheter was inserted in the left femoral  vein and AV junction into the coronary sinus and a 7 French quadripolar  catheter was inserted in the left femoral vein to the high right atrium in  the coronary sinus and finally an 8 [place] EZ-100 electrical anatomical  mapping catheter was inserted in the right femoral vein to the right atrium.   Multiple arrhythmias were induced post coronary sinus pacing.   These were mapped using both the electromyography as well as the electrical  anatomical  array demonstrating these atrial arrhythmias which all seemed to  originate from the left atrium.   Because of that, the procedure was  terminated. All catheters were withdrawn and the patient was then  transferred to the floor in stable condition.   The patient had a previously implanted defibrillator that was interrogated  at the end of the procedure.  The patient's rhythm remained sinus with  bradycardia.   It should also be noted that the patient received heparin and isoproterenol  during the procedure.   The patient tolerated the procedure well without apparent complications.                                               Duke Salvia, M.D. [facility]    SCK/MEDQ  D:  10/11/2002  T:  10/11/2002  Job:  [id number]

## 2011-04-23 NOTE — Assessment & Plan Note (Signed)
No sxs Continue risk factor modification 

## 2011-04-23 NOTE — Assessment & Plan Note (Signed)
No significant sxs Continue current meds Note adjustments of meds has improved bradycardia and orthostatic sxs

## 2011-04-23 NOTE — Assessment & Plan Note (Signed)
[facility]                           ELECTROPHYSIOLOGY OFFICE NOTE   ARTEMUS, ROMANOFF                    MRN:          [id number]  DATE:09/26/2006                            DOB:          02-May-1941    DEVICE CLINIC NOTE   Mr. Colmenares is seen in Device Clinic today, September 26, 2006 for routine  followup of his Medtronic [facility] Model [id number] single-chamber defibrillator  implanted in July 2001 for ventricular tachycardia.  Upon interrogation,  battery voltage is 2.59 volts with charge time of 10.21 seconds.  In the  ventricle, intrinsic amplitude is 8 millivolts with an impedance of 410 ohms  and a threshold of 2 volts at 0.3 milliseconds.  High voltage lead impedance  is 17 ohms.  No programming changes were made at this time.  He will be seen  in 4 months' time again for followup.      ______________________________  Cleatrice Burke, RN    ______________________________  Duke Salvia, MD, [facility]    CF/MedQ  DD:  09/26/2006  DT:  09/27/2006  Job #:  [id number]

## 2011-04-23 NOTE — Assessment & Plan Note (Signed)
[facility]                         ELECTROPHYSIOLOGY OFFICE NOTE   PAULETTE, LYNCH                    MRN:          [id number]  DATE:01/27/2007                            DOB:          08-09-1941    Mr. Whiters is seen. He has a history of ventricular tachycardia. He is  doing quite well. He has no complaints of chest pain or shortness of  breath. He has been able to lose his weight and keep it off.   His medications are reviewed, and again, I note that he is taking  Lipitor every other day. We do not have data on his statins; these are  being followed by Dr. Cato Mulligan.   On examination, his blood pressure is 122/78. His pulse was 60.  LUNGS:  Were clear.  HEART:  Sounds were regular.  EXTREMITIES:  Were within normal limits edema.   Interrogation of his Medtronic Gem [id number] device demonstrates a R wave of  8 with impedance of 244, threshold of 2 volts at 0.2. There are no  intercurrent therapies.   IMPRESSION:  1. Ventricular tachycardia.  2. Status post ICD for the above with multiple procedures related to      infection and lead fracture.  3. Ischemic heart disease with depressed left ventricular function,      remote catheterization and nonischemic Cardiolite.  4. Dyslipidemia with a low HDL.  5. Diabetes under weight control with good weight loss.  6. Atrial fibrillation on Coumadin.   Mr. Timson is doing fine. I had talked to him when I saw him last about  putting him on Niacin. He is supposed to see Dr. Cato Mulligan in two weeks'  time, and I will defer that to him as it did not happen last time.   We will see him again in four months' time in the device clinic.     Duke Salvia, MD, [facility]  Electronically Signed    SCK/MedQ  DD: 01/27/2007  DT: 01/27/2007  Job #: [id number]   cc:   Valetta Mole. Swords, MD

## 2011-05-13 ENCOUNTER — Ambulatory Visit (INDEPENDENT_AMBULATORY_CARE_PROVIDER_SITE_OTHER): Payer: Medicare Other | Admitting: Internal Medicine

## 2011-05-13 ENCOUNTER — Encounter: Payer: Medicare Other | Admitting: *Deleted

## 2011-05-13 DIAGNOSIS — I82409 Acute embolism and thrombosis of unspecified deep veins of unspecified lower extremity: Secondary | ICD-10-CM

## 2011-05-13 LAB — POCT INR: INR: 2.6

## 2011-05-13 NOTE — Patient Instructions (Signed)
Same Dose?

## 2011-05-17 ENCOUNTER — Ambulatory Visit (INDEPENDENT_AMBULATORY_CARE_PROVIDER_SITE_OTHER): Payer: Medicare Other | Admitting: *Deleted

## 2011-05-17 DIAGNOSIS — I472 Ventricular tachycardia, unspecified: Secondary | ICD-10-CM

## 2011-05-17 DIAGNOSIS — I509 Heart failure, unspecified: Secondary | ICD-10-CM

## 2011-05-17 DIAGNOSIS — I4729 Other ventricular tachycardia: Secondary | ICD-10-CM

## 2011-05-17 NOTE — Progress Notes (Signed)
icd check in clinic  

## 2011-05-21 ENCOUNTER — Encounter: Payer: Self-pay | Admitting: *Deleted

## 2011-05-25 ENCOUNTER — Other Ambulatory Visit: Payer: Self-pay | Admitting: Internal Medicine

## 2011-06-07 ENCOUNTER — Other Ambulatory Visit: Payer: Self-pay | Admitting: Internal Medicine

## 2011-06-10 ENCOUNTER — Ambulatory Visit (INDEPENDENT_AMBULATORY_CARE_PROVIDER_SITE_OTHER)
Admission: RE | Admit: 2011-06-10 | Discharge: 2011-06-10 | Disposition: A | Discharge status: Home / Self Care | Payer: Medicare Other | Source: Ambulatory Visit | Attending: Internal Medicine | Admitting: Internal Medicine

## 2011-06-10 ENCOUNTER — Ambulatory Visit: Payer: Medicare Other

## 2011-06-10 ENCOUNTER — Ambulatory Visit (INDEPENDENT_AMBULATORY_CARE_PROVIDER_SITE_OTHER): Payer: Medicare Other | Admitting: Internal Medicine

## 2011-06-10 VITALS — BP 106/64 | HR 64 | Temp 98.2°F | Resp 16 | Ht 72.0 in | Wt 195.0 lb

## 2011-06-10 DIAGNOSIS — M25569 Pain in unspecified knee: Secondary | ICD-10-CM

## 2011-06-10 DIAGNOSIS — M25561 Pain in right knee: Secondary | ICD-10-CM

## 2011-06-10 DIAGNOSIS — I82409 Acute embolism and thrombosis of unspecified deep veins of unspecified lower extremity: Secondary | ICD-10-CM

## 2011-06-10 LAB — POCT INR: INR: 2.1

## 2011-06-10 NOTE — Progress Notes (Signed)
  Subjective:    Patient ID: Carl Wood, male    DOB: 11/23/1941, 70 y.o.   MRN: [id number]  HPI  r lateral knee pain. Duration--one week. Seems to get worse with activity but not predictable. No trauma. No pain at rest and no pain when he first gets up in the a.m.. No swelling or erythema. He has had similar pain previously---resolved spontaneously.    Past Medical History  Diagnosis Date  . PE (pulmonary embolism)   . Colon polyps   . CHF (congestive heart failure)   . CAD (coronary artery disease)   . Diabetes mellitus   . Diverticulosis of colon   . Hyperlipidemia   . Hypertension   . MI (mitral incompetence)   . Nephrolithiasis   . V-tach    Past Surgical History  Procedure Date  . Back surgery   . Cardiac defibrillator placement     medtronic virtuoso  . Basal cell carcinoma excision     nose  . Knee arthroplasty     reports that he has been smoking Cigarettes.  He has been smoking about .5 packs per day. He does not have any smokeless tobacco history on file. His alcohol and drug histories not on file. family history is not on file. Allergies  Allergen Reactions  . Penicillins     REACTION: unspecified  . Sulfamethoxazole     REACTION: unspecified     Review of Systems patient denies chest pain, shortness of breath, orthopnea. Denies lower extremity edema, abdominal pain, change in appetite, change in bowel movements. Patient denies rashes, musculoskeletal complaints. No other specific complaints in a complete review of systems.      Objective:   Physical Exam  well-developed well-nourished male in no acute distress. HEENT exam atraumatic, normocephalic, neck supple without jugular venous distention. Chest clear to auscultation cardiac exam S1-S2 are regular. Abdominal exam overweight with bowel sounds, soft and nontender. Extremities no edema. Neurologic exam is alert with a normal gait. FROM both knees , no tenderness to palpation, knees are stable, no  effusion      Assessment & Plan:  Knee pain---i suspect tendonitis check xray and then make treatment decision

## 2011-06-10 NOTE — Progress Notes (Signed)
Addended by: Bonnye Fava on: 06/10/2011 09:14 AM   Modules accepted: Orders

## 2011-06-15 ENCOUNTER — Telehealth: Payer: Self-pay | Admitting: *Deleted

## 2011-06-15 NOTE — Telephone Encounter (Signed)
Pt is calling and upset that he has not heard from his knee xray results.

## 2011-06-16 ENCOUNTER — Telehealth: Payer: Self-pay | Admitting: Internal Medicine

## 2011-06-16 DIAGNOSIS — M25561 Pain in right knee: Secondary | ICD-10-CM

## 2011-06-16 NOTE — Progress Notes (Signed)
LMTCB

## 2011-06-16 NOTE — Telephone Encounter (Signed)
See results

## 2011-06-16 NOTE — Telephone Encounter (Signed)
Gave pt x-ray results and pt says his knee is getting worse but he feels like PT will not help.  Wants another recommendation

## 2011-06-18 NOTE — Telephone Encounter (Signed)
Refer ortho 

## 2011-06-18 NOTE — Telephone Encounter (Signed)
Referral order placed, pt aware 

## 2011-07-08 ENCOUNTER — Other Ambulatory Visit: Payer: Self-pay | Admitting: Internal Medicine

## 2011-07-09 ENCOUNTER — Ambulatory Visit (INDEPENDENT_AMBULATORY_CARE_PROVIDER_SITE_OTHER): Payer: Medicare Other | Admitting: Internal Medicine

## 2011-07-09 DIAGNOSIS — I82409 Acute embolism and thrombosis of unspecified deep veins of unspecified lower extremity: Secondary | ICD-10-CM

## 2011-07-09 LAB — POCT INR: INR: 2.4

## 2011-07-09 NOTE — Patient Instructions (Signed)
Same dose, 2.5 mg only on sundays, 5 mg on other days,check in 4 weeks

## 2011-08-06 ENCOUNTER — Ambulatory Visit (INDEPENDENT_AMBULATORY_CARE_PROVIDER_SITE_OTHER): Payer: Medicare Other | Admitting: Internal Medicine

## 2011-08-06 DIAGNOSIS — I82409 Acute embolism and thrombosis of unspecified deep veins of unspecified lower extremity: Secondary | ICD-10-CM

## 2011-08-06 LAB — POCT INR: INR: 2.2

## 2011-08-06 NOTE — Patient Instructions (Signed)
Same dose Check in 1 month

## 2011-08-18 ENCOUNTER — Encounter: Payer: Self-pay | Admitting: Internal Medicine

## 2011-08-18 ENCOUNTER — Ambulatory Visit (INDEPENDENT_AMBULATORY_CARE_PROVIDER_SITE_OTHER): Payer: Medicare Other | Admitting: *Deleted

## 2011-08-18 DIAGNOSIS — I509 Heart failure, unspecified: Secondary | ICD-10-CM

## 2011-08-18 DIAGNOSIS — I4729 Other ventricular tachycardia: Secondary | ICD-10-CM

## 2011-08-18 DIAGNOSIS — I472 Ventricular tachycardia, unspecified: Secondary | ICD-10-CM

## 2011-08-18 LAB — ICD DEVICE OBSERVATION
BATTERY VOLTAGE: 3.13 V
BRDY-0002RV: 40 {beats}/min
CHARGE TIME: 9.479 s
FVT: 0
PACEART VT: 0
RV LEAD AMPLITUDE: 8.8 mv
TOT-0001: 5
TZAT-0004SLOWVT: 8
TZAT-0012FASTVT: 200 ms
TZAT-0018FASTVT: NEGATIVE
TZAT-0019SLOWVT: 8 V
TZAT-0019SLOWVT: 8 V
TZAT-0020FASTVT: 1.5 ms
TZAT-0020SLOWVT: 1.5 ms
TZAT-0020SLOWVT: 1.5 ms
TZON-0003VSLOWVT: 430 ms
TZST-0001FASTVT: 4
TZST-0001FASTVT: 5
TZST-0001FASTVT: 6
TZST-0001SLOWVT: 3
TZST-0001SLOWVT: 5
TZST-0002FASTVT: NEGATIVE
TZST-0002FASTVT: NEGATIVE
TZST-0003SLOWVT: 25 J
TZST-0003SLOWVT: 35 J
TZST-0003SLOWVT: 35 J
VENTRICULAR PACING ICD: 0.98 pct
VF: 0

## 2011-08-18 NOTE — Progress Notes (Signed)
icd check in clinic  

## 2011-08-24 ENCOUNTER — Emergency Department (HOSPITAL_COMMUNITY): Payer: Medicare Other

## 2011-08-24 ENCOUNTER — Emergency Department (HOSPITAL_COMMUNITY)
Admission: EM | Admit: 2011-08-24 | Discharge: 2011-08-24 | Disposition: A | Discharge status: Home / Self Care | Payer: Medicare Other | Attending: Emergency Medicine | Admitting: Emergency Medicine

## 2011-08-24 DIAGNOSIS — Z950 Presence of cardiac pacemaker: Secondary | ICD-10-CM | POA: Insufficient documentation

## 2011-08-24 DIAGNOSIS — S61209A Unspecified open wound of unspecified finger without damage to nail, initial encounter: Secondary | ICD-10-CM | POA: Insufficient documentation

## 2011-08-24 DIAGNOSIS — Z7901 Long term (current) use of anticoagulants: Secondary | ICD-10-CM | POA: Insufficient documentation

## 2011-08-24 DIAGNOSIS — W298XXA Contact with other powered powered hand tools and household machinery, initial encounter: Secondary | ICD-10-CM | POA: Insufficient documentation

## 2011-08-24 LAB — PROTIME-INR
INR: 2.14 — ABNORMAL HIGH (ref 0.00–1.49)
Prothrombin Time: 24.3 seconds — ABNORMAL HIGH (ref 11.6–15.2)

## 2011-08-24 LAB — POCT I-STAT, CHEM 8
HCT: 51 % (ref 39.0–52.0)
Hemoglobin: 17.3 g/dL — ABNORMAL HIGH (ref 13.0–17.0)
Sodium: 138 mEq/L (ref 135–145)
TCO2: 26 mmol/L (ref 0–100)

## 2011-09-02 ENCOUNTER — Encounter: Payer: Self-pay | Admitting: *Deleted

## 2011-09-03 ENCOUNTER — Ambulatory Visit: Payer: Medicare Other

## 2011-09-08 ENCOUNTER — Encounter: Payer: Self-pay | Admitting: Internal Medicine

## 2011-09-08 ENCOUNTER — Ambulatory Visit (INDEPENDENT_AMBULATORY_CARE_PROVIDER_SITE_OTHER): Payer: Medicare Other | Admitting: Internal Medicine

## 2011-09-08 DIAGNOSIS — I82409 Acute embolism and thrombosis of unspecified deep veins of unspecified lower extremity: Secondary | ICD-10-CM

## 2011-09-08 DIAGNOSIS — E119 Type 2 diabetes mellitus without complications: Secondary | ICD-10-CM

## 2011-09-08 DIAGNOSIS — S61012A Laceration without foreign body of left thumb without damage to nail, initial encounter: Secondary | ICD-10-CM

## 2011-09-08 DIAGNOSIS — S61209A Unspecified open wound of unspecified finger without damage to nail, initial encounter: Secondary | ICD-10-CM

## 2011-09-08 DIAGNOSIS — I10 Essential (primary) hypertension: Secondary | ICD-10-CM

## 2011-09-08 DIAGNOSIS — Z23 Encounter for immunization: Secondary | ICD-10-CM

## 2011-09-08 DIAGNOSIS — Z0000 Encounter for general adult medical examination without abnormal findings: Secondary | ICD-10-CM

## 2011-09-08 NOTE — Progress Notes (Signed)
  Subjective:    Patient ID: Carl Wood, male    DOB: Sep 03, 1941, 70 y.o.   MRN: [id number]  HPI  70 year old patient who sustained injury to his left thumb using power tools in 15 days ago. He is seen here today for suture removal. He is still having considerable pain and throbbing.    Review of Systems     Objective:   Physical Exam   A curvilinear laceration and trauma noted to the left great on the palmar surface. 15 sutures were removed. There is still poor approximation with  nonviable tissue     Assessment & Plan:    Complex laceration left. Sutures were removed. The wound was treated with antibiotic ointment and dressed. He will need to see a hand surgeon for further debridement under local anesthesia. He may require a skin graft.

## 2011-09-08 NOTE — Patient Instructions (Addendum)
  Latest dosing instructions   Total Sun Mon Tue Wed Thu Fri Sat   35 5 mg 5 mg 5 mg 5 mg 5 mg 5 mg 5 mg    (5 mg1) (5 mg1) (5 mg1) (5 mg1) (5 mg1) (5 mg1) (5 mg1)       Continue wound care as directed Follow up with hand surgeon this week as scheduled  Call or return to clinic prn if these symptoms worsen or fail to improve as anticipated.

## 2011-09-16 ENCOUNTER — Encounter: Payer: Self-pay | Admitting: Internal Medicine

## 2011-09-22 ENCOUNTER — Ambulatory Visit: Payer: Medicare Other

## 2011-09-24 ENCOUNTER — Ambulatory Visit (INDEPENDENT_AMBULATORY_CARE_PROVIDER_SITE_OTHER): Payer: Medicare Other | Admitting: Internal Medicine

## 2011-09-24 DIAGNOSIS — I82409 Acute embolism and thrombosis of unspecified deep veins of unspecified lower extremity: Secondary | ICD-10-CM

## 2011-09-24 NOTE — Patient Instructions (Signed)
  Latest dosing instructions   Total Sun Mon Tue Wed Thu Fri Sat   35 5 mg 5 mg 5 mg 5 mg 5 mg 5 mg 5 mg    (5 mg1) (5 mg1) (5 mg1) (5 mg1) (5 mg1) (5 mg1) (5 mg1)        

## 2011-10-07 ENCOUNTER — Other Ambulatory Visit: Payer: Self-pay | Admitting: Internal Medicine

## 2011-10-15 ENCOUNTER — Other Ambulatory Visit (INDEPENDENT_AMBULATORY_CARE_PROVIDER_SITE_OTHER): Payer: Medicare Other

## 2011-10-15 DIAGNOSIS — I10 Essential (primary) hypertension: Secondary | ICD-10-CM

## 2011-10-15 DIAGNOSIS — E785 Hyperlipidemia, unspecified: Secondary | ICD-10-CM

## 2011-10-15 DIAGNOSIS — E119 Type 2 diabetes mellitus without complications: Secondary | ICD-10-CM

## 2011-10-15 DIAGNOSIS — I82409 Acute embolism and thrombosis of unspecified deep veins of unspecified lower extremity: Secondary | ICD-10-CM

## 2011-10-15 LAB — HEPATIC FUNCTION PANEL
Albumin: 3.9 g/dL (ref 3.5–5.2)
Alkaline Phosphatase: 75 U/L (ref 39–117)
Bilirubin, Direct: 0.1 mg/dL (ref 0.0–0.3)

## 2011-10-15 LAB — BASIC METABOLIC PANEL
CO2: 31 mEq/L (ref 19–32)
Calcium: 9.1 mg/dL (ref 8.4–10.5)
Chloride: 105 mEq/L (ref 96–112)
Creatinine, Ser: 0.9 mg/dL (ref 0.4–1.5)
Glucose, Bld: 102 mg/dL — ABNORMAL HIGH (ref 70–99)

## 2011-10-15 LAB — LIPID PANEL
LDL Cholesterol: 72 mg/dL (ref 0–99)
Total CHOL/HDL Ratio: 3
VLDL: 11.6 mg/dL (ref 0.0–40.0)

## 2011-10-15 NOTE — Patient Instructions (Signed)
  Latest dosing instructions   Total Sun Mon Tue Wed Thu Fri Sat   32.5 2.5 mg 5 mg 5 mg 5 mg 5 mg 5 mg 5 mg    (5 mg0.5) (5 mg1) (5 mg1) (5 mg1) (5 mg1) (5 mg1) (5 mg1)        

## 2011-10-25 ENCOUNTER — Encounter: Payer: Self-pay | Admitting: Internal Medicine

## 2011-10-25 ENCOUNTER — Ambulatory Visit (INDEPENDENT_AMBULATORY_CARE_PROVIDER_SITE_OTHER): Payer: Medicare Other | Admitting: Internal Medicine

## 2011-10-25 DIAGNOSIS — I10 Essential (primary) hypertension: Secondary | ICD-10-CM

## 2011-10-25 DIAGNOSIS — E119 Type 2 diabetes mellitus without complications: Secondary | ICD-10-CM

## 2011-10-25 DIAGNOSIS — I509 Heart failure, unspecified: Secondary | ICD-10-CM

## 2011-10-25 DIAGNOSIS — I2510 Atherosclerotic heart disease of native coronary artery without angina pectoris: Secondary | ICD-10-CM

## 2011-10-25 DIAGNOSIS — E785 Hyperlipidemia, unspecified: Secondary | ICD-10-CM

## 2011-10-25 NOTE — Progress Notes (Signed)
Patient Active Problem List  Diagnoses  .   .   . DIABETES MELLITUS, TYPE II---here for f/u--no meds  . HYPERLIPIDEMIA---tolerating meds  . PSEUDOGOUT--no recurrence  . HYPERTENSION---tolerating meds  . MYOCARDIAL INFARCTION, HX OF  . CORONARY ARTERY DISEASE--no sxs  .   .   .   .   .   .   .   .   Marland Kitchen DVT (deep venous thrombosis)---tolerating warfarin      Past Medical History  Diagnosis Date  . PE (pulmonary embolism)   . Colon polyps   . CHF (congestive heart failure)   . CAD (coronary artery disease)   . Diabetes mellitus   . Diverticulosis of colon   . Hyperlipidemia   . Hypertension   . MI (mitral incompetence)   . Nephrolithiasis   . V-tach     History   Social History  . Marital Status: Married    Spouse Name: N/A    Number of Children: N/A  . Years of Education: N/A   Occupational History  . Not on file.   Social History Main Topics  . Smoking status: Current Everyday Smoker -- 0.5 packs/day    Types: Cigarettes  . Smokeless tobacco: Never Used  . Alcohol Use: Not on file  . Drug Use: Not on file  . Sexually Active: Not on file   Other Topics Concern  . Not on file   Social History Narrative  . No narrative on file    Past Surgical History  Procedure Date  . Back surgery   . Cardiac defibrillator placement     medtronic virtuoso  . Basal cell carcinoma excision     nose  . Knee arthroplasty     No family history on file.  Allergies  Allergen Reactions  . Penicillins     REACTION: unspecified  . Sulfamethoxazole     REACTION: unspecified    Current Outpatient Prescriptions on File Prior to Visit  Medication Sig Dispense Refill  . albuterol (PROVENTIL,VENTOLIN) 90 MCG/ACT inhaler Inhale 2 puffs into the lungs every 6 (six) hours as needed.        Marland Kitchen aspirin 325 MG tablet Take 325 mg by mouth daily.        Marland Kitchen atorvastatin (LIPITOR) 80 MG tablet Take 80 mg by mouth daily.        . carvedilol (COREG) 6.25 MG tablet Take 1 tablet  by mouth Twice daily.      . enalapril (VASOTEC) 10 MG tablet TAKE ONE TABLET BY MOUTH TWICE DAILY  60 tablet  10  . fish oil-omega-3 fatty acids 1000 MG capsule Take 2 g by mouth daily.        . fluticasone (FLONASE) 50 MCG/ACT nasal spray USE TWO SPRAY IN EACH NOSTRIL EVERY DAY  16 g  0  . LANOXIN 0.25 MG tablet Take 0.5 tablets (125 mcg total) by mouth daily.  15 each  6  . warfarin (COUMADIN) 5 MG tablet TAKE AS DIRECTED  30 tablet  11    BP 110/64  Pulse 68  Temp(Src) 98.1 F (36.7 C) (Oral)  Wt 200 lb (90.719 kg)  SpO2 98%   well-developed well-nourished male in no acute distress. HEENT exam atraumatic, normocephalic, neck supple without jugular venous distention. Chest clear to auscultation cardiac exam S1-S2 are regular. Abdominal exam overweight with bowel sounds, soft and nontender. Extremities no edema. Neurologic exam is alert with a normal gait.

## 2011-10-25 NOTE — Assessment & Plan Note (Signed)
No sxs Continue current meds and risk factor modification

## 2011-10-25 NOTE — Assessment & Plan Note (Signed)
No meds Advised daily exercise

## 2011-10-25 NOTE — Assessment & Plan Note (Signed)
BP Readings from Last 3 Encounters:  10/25/11 110/64  09/08/11 112/68  06/10/11 106/64   Well controlled

## 2011-10-25 NOTE — Assessment & Plan Note (Signed)
No sxs Doing well on meds

## 2011-11-09 ENCOUNTER — Other Ambulatory Visit: Payer: Self-pay | Admitting: Internal Medicine

## 2011-11-12 ENCOUNTER — Ambulatory Visit: Payer: Medicare Other

## 2011-11-12 ENCOUNTER — Ambulatory Visit (INDEPENDENT_AMBULATORY_CARE_PROVIDER_SITE_OTHER): Payer: Medicare Other

## 2011-11-12 DIAGNOSIS — I82409 Acute embolism and thrombosis of unspecified deep veins of unspecified lower extremity: Secondary | ICD-10-CM

## 2011-11-12 NOTE — Patient Instructions (Signed)
  Latest dosing instructions   Total Sun Mon Tue Wed Thu Fri Sat   32.5 2.5 mg 5 mg 5 mg 5 mg 5 mg 5 mg 5 mg    (5 mg0.5) (5 mg1) (5 mg1) (5 mg1) (5 mg1) (5 mg1) (5 mg1)

## 2011-11-17 ENCOUNTER — Ambulatory Visit (INDEPENDENT_AMBULATORY_CARE_PROVIDER_SITE_OTHER): Payer: Medicare Other | Admitting: *Deleted

## 2011-11-17 ENCOUNTER — Encounter: Payer: Self-pay | Admitting: Internal Medicine

## 2011-11-17 DIAGNOSIS — I472 Ventricular tachycardia, unspecified: Secondary | ICD-10-CM

## 2011-11-17 DIAGNOSIS — I4729 Other ventricular tachycardia: Secondary | ICD-10-CM

## 2011-11-17 DIAGNOSIS — I509 Heart failure, unspecified: Secondary | ICD-10-CM

## 2011-11-17 LAB — ICD DEVICE OBSERVATION
BATTERY VOLTAGE: 3.1 V
BRDY-0002RV: 40 {beats}/min
CHARGE TIME: 9.699 s
PACEART VT: 0
RV LEAD AMPLITUDE: 9.8182 mv
TOT-0001: 5
TZAT-0004SLOWVT: 8
TZAT-0004SLOWVT: 8
TZAT-0011SLOWVT: 10 ms
TZAT-0011SLOWVT: 10 ms
TZAT-0012FASTVT: 200 ms
TZAT-0012SLOWVT: 200 ms
TZAT-0018FASTVT: NEGATIVE
TZAT-0019FASTVT: 8 V
TZAT-0019SLOWVT: 8 V
TZAT-0019SLOWVT: 8 V
TZAT-0020FASTVT: 1.5 ms
TZAT-0020SLOWVT: 1.5 ms
TZON-0003SLOWVT: 330 ms
TZON-0003VSLOWVT: 430 ms
TZON-0004VSLOWVT: 32
TZST-0001FASTVT: 2
TZST-0001FASTVT: 5
TZST-0001FASTVT: 6
TZST-0001SLOWVT: 4
TZST-0001SLOWVT: 5
TZST-0002FASTVT: NEGATIVE
TZST-0002FASTVT: NEGATIVE
TZST-0002FASTVT: NEGATIVE
TZST-0003SLOWVT: 25 J
TZST-0003SLOWVT: 35 J
VENTRICULAR PACING ICD: 0.59 pct
VF: 0

## 2011-11-17 NOTE — Progress Notes (Signed)
icd check in clinic  

## 2011-11-24 ENCOUNTER — Ambulatory Visit (INDEPENDENT_AMBULATORY_CARE_PROVIDER_SITE_OTHER): Payer: Medicare Other | Admitting: Family Medicine

## 2011-11-24 ENCOUNTER — Encounter: Payer: Self-pay | Admitting: Family Medicine

## 2011-11-24 VITALS — BP 122/70 | HR 84 | Temp 99.1°F | Wt 197.0 lb

## 2011-11-24 DIAGNOSIS — Z720 Tobacco use: Secondary | ICD-10-CM

## 2011-11-24 DIAGNOSIS — J209 Acute bronchitis, unspecified: Secondary | ICD-10-CM

## 2011-11-24 DIAGNOSIS — F17200 Nicotine dependence, unspecified, uncomplicated: Secondary | ICD-10-CM

## 2011-11-24 DIAGNOSIS — R062 Wheezing: Secondary | ICD-10-CM

## 2011-11-24 MED ORDER — METHYLPREDNISOLONE ACETATE 80 MG/ML IJ SUSP
80.0000 mg | Freq: Once | INTRAMUSCULAR | Status: AC
  Administered 2011-11-24: 80 mg via INTRAMUSCULAR

## 2011-11-24 MED ORDER — CEFUROXIME AXETIL 250 MG PO TABS: 250.0000 mg | ORAL_TABLET | Freq: Two times a day (BID) | ORAL | Status: AC

## 2011-11-24 MED ORDER — GUAIFENESIN-CODEINE 100-10 MG/5ML PO SYRP: 5.0000 mL | ORAL_SOLUTION | Freq: Three times a day (TID) | ORAL | Status: AC | PRN

## 2011-11-24 NOTE — Progress Notes (Signed)
Subjective:    Patient ID: Carl Wood, male    DOB: 1941/04/18, 70 y.o.   MRN: [id number]  HPI 70 year old white male, half a pack per day smoker, and with complaints of cough, congestion, fever is due to one of her 3 days. Cough is productive with green sputum and wheezing. He has mild shortness of breath. Symptoms have worsened over the last several days. If any sick contacts. He has not taken any medication at this point.   Review of Systems  Constitutional: Positive for fatigue.  HENT: Positive for congestion.   Respiratory: Negative.        Mild shortness of breath  Cardiovascular: Negative.   Skin: Negative.   Neurological: Negative.   Psychiatric/Behavioral: Negative.        Past Medical History  Diagnosis Date  . PE (pulmonary embolism)   . Colon polyps   . CHF (congestive heart failure)   . CAD (coronary artery disease)   . Diabetes mellitus   . Diverticulosis of colon   . Hyperlipidemia   . Hypertension   . MI (mitral incompetence)   . Nephrolithiasis   . V-tach     History   Social History  . Marital Status: Married    Spouse Name: N/A    Number of Children: N/A  . Years of Education: N/A   Occupational History  . Not on file.   Social History Main Topics  . Smoking status: Current Everyday Smoker -- 0.5 packs/day    Types: Cigarettes  . Smokeless tobacco: Never Used  . Alcohol Use: Not on file  . Drug Use: Not on file  . Sexually Active: Not on file   Other Topics Concern  . Not on file   Social History Narrative  . No narrative on file    Past Surgical History  Procedure Date  . Back surgery   . Cardiac defibrillator placement     medtronic virtuoso  . Basal cell carcinoma excision     nose  . Knee arthroplasty     No family history on file.  Allergies  Allergen Reactions  . Penicillins     REACTION: unspecified  . Sulfamethoxazole     REACTION: unspecified    Current Outpatient Prescriptions on File Prior to Visit    Medication Sig Dispense Refill  . albuterol (PROVENTIL,VENTOLIN) 90 MCG/ACT inhaler Inhale 2 puffs into the lungs every 6 (six) hours as needed.        Marland Kitchen aspirin 325 MG tablet Take 325 mg by mouth daily.        Marland Kitchen atorvastatin (LIPITOR) 80 MG tablet Take 80 mg by mouth daily.        . carvedilol (COREG) 6.25 MG tablet Take 1 tablet by mouth Twice daily.      . enalapril (VASOTEC) 10 MG tablet TAKE ONE TABLET BY MOUTH TWICE DAILY  60 tablet  10  . fish oil-omega-3 fatty acids 1000 MG capsule Take 2 g by mouth daily.        . fluticasone (FLONASE) 50 MCG/ACT nasal spray USE TWO SPRAY IN EACH NOSTRIL EVERY DAY  16 g  0  . LANOXIN 0.25 MG tablet TAKE ONE-HALF TABLET BY MOUTH EVERY DAY  15 each  5  . warfarin (COUMADIN) 5 MG tablet TAKE AS DIRECTED  30 tablet  11   No current facility-administered medications on file prior to visit.    BP 122/70  Pulse 84  Temp(Src) 99.1 F (37.3 C) (Oral)  Wt  197 lb (89.359 kg)  SpO2 98%chart Objective:   Physical Exam  Constitutional: He is oriented to person, place, and time. He appears well-developed.  HENT:  Head: Normocephalic.  Right Ear: External ear normal.  Left Ear: External ear normal.  Mouth/Throat: Oropharynx is clear and moist.  Neck: Normal range of motion. Neck supple.  Cardiovascular: Normal rate, regular rhythm and normal heart sounds.   Pulmonary/Chest: Effort normal. He has wheezes.  Musculoskeletal: Normal range of motion.  Neurological: He is alert and oriented to person, place, and time.  Skin: Skin is warm and dry.  Psychiatric: He has a normal mood and affect.          Assessment & Plan:  Assessment: Acute bronchitis, wheezing  Plan: Depo-Medrol 80 mg IM x1 given in the office. Ceftin 250 mg one tablet twice a day for 10 days. Warned of a 2-3% chance of cross reaction and these have a penicillin allergy and past. Patient reports that over 50 years since his last shot of penicillin and the physician was not sure if it  was the shot or the medication that caused him to pass out. Robitussin-AC 1 teaspoon 3 times a day when necessary cough. Warned of drowsiness. Call if symptoms worsen or persist. Recheck as scheduled when necessary.

## 2011-11-24 NOTE — Patient Instructions (Signed)

## 2011-12-08 ENCOUNTER — Other Ambulatory Visit: Payer: Self-pay | Admitting: Internal Medicine

## 2011-12-10 ENCOUNTER — Ambulatory Visit (INDEPENDENT_AMBULATORY_CARE_PROVIDER_SITE_OTHER): Payer: Medicare Other

## 2011-12-10 DIAGNOSIS — Z7901 Long term (current) use of anticoagulants: Secondary | ICD-10-CM

## 2011-12-10 DIAGNOSIS — I82409 Acute embolism and thrombosis of unspecified deep veins of unspecified lower extremity: Secondary | ICD-10-CM

## 2011-12-10 DIAGNOSIS — Z5181 Encounter for therapeutic drug level monitoring: Secondary | ICD-10-CM

## 2011-12-10 LAB — POCT INR: INR: 2.3

## 2011-12-10 NOTE — Patient Instructions (Signed)
  Latest dosing instructions   Total Sun Mon Tue Wed Thu Fri Sat   32.5 2.5 mg 5 mg 5 mg 5 mg 5 mg 5 mg 5 mg    (5 mg0.5) (5 mg1) (5 mg1) (5 mg1) (5 mg1) (5 mg1) (5 mg1)        

## 2011-12-20 ENCOUNTER — Other Ambulatory Visit: Payer: Self-pay | Admitting: Internal Medicine

## 2012-01-10 ENCOUNTER — Ambulatory Visit (INDEPENDENT_AMBULATORY_CARE_PROVIDER_SITE_OTHER): Payer: Medicare Other | Admitting: Internal Medicine

## 2012-01-10 DIAGNOSIS — I82409 Acute embolism and thrombosis of unspecified deep veins of unspecified lower extremity: Secondary | ICD-10-CM

## 2012-01-10 NOTE — Patient Instructions (Signed)
  Latest dosing instructions   Total Sun Mon Tue Wed Thu Fri Sat   32.5 2.5 mg 5 mg 5 mg 5 mg 5 mg 5 mg 5 mg    (5 mg0.5) (5 mg1) (5 mg1) (5 mg1) (5 mg1) (5 mg1) (5 mg1)        

## 2012-02-07 ENCOUNTER — Ambulatory Visit (INDEPENDENT_AMBULATORY_CARE_PROVIDER_SITE_OTHER): Payer: Medicare Other | Admitting: Internal Medicine

## 2012-02-07 ENCOUNTER — Other Ambulatory Visit: Payer: Self-pay | Admitting: Internal Medicine

## 2012-02-07 DIAGNOSIS — I82409 Acute embolism and thrombosis of unspecified deep veins of unspecified lower extremity: Secondary | ICD-10-CM

## 2012-02-07 NOTE — Patient Instructions (Signed)
  Latest dosing instructions   Total Sun Mon Tue Wed Thu Fri Sat   32.5 2.5 mg 5 mg 5 mg 5 mg 5 mg 5 mg 5 mg    (5 mg0.5) (5 mg1) (5 mg1) (5 mg1) (5 mg1) (5 mg1) (5 mg1)        

## 2012-02-16 ENCOUNTER — Encounter: Payer: Self-pay | Admitting: Family Medicine

## 2012-02-16 ENCOUNTER — Ambulatory Visit (INDEPENDENT_AMBULATORY_CARE_PROVIDER_SITE_OTHER): Payer: Medicare Other | Admitting: Family Medicine

## 2012-02-16 ENCOUNTER — Ambulatory Visit (INDEPENDENT_AMBULATORY_CARE_PROVIDER_SITE_OTHER)
Admission: RE | Admit: 2012-02-16 | Discharge: 2012-02-16 | Disposition: A | Discharge status: Home / Self Care | Payer: Medicare Other | Source: Ambulatory Visit | Attending: Family Medicine | Admitting: Family Medicine

## 2012-02-16 VITALS — BP 90/58 | Temp 97.8°F | Wt 202.0 lb

## 2012-02-16 DIAGNOSIS — R609 Edema, unspecified: Secondary | ICD-10-CM

## 2012-02-16 DIAGNOSIS — L02519 Cutaneous abscess of unspecified hand: Secondary | ICD-10-CM

## 2012-02-16 DIAGNOSIS — R600 Localized edema: Secondary | ICD-10-CM

## 2012-02-16 DIAGNOSIS — L03114 Cellulitis of left upper limb: Secondary | ICD-10-CM

## 2012-02-16 DIAGNOSIS — S60559A Superficial foreign body of unspecified hand, initial encounter: Secondary | ICD-10-CM

## 2012-02-16 MED ORDER — CEPHALEXIN 500 MG PO CAPS: 500.0000 mg | ORAL_CAPSULE | Freq: Three times a day (TID) | ORAL | Status: AC

## 2012-02-16 NOTE — Progress Notes (Signed)
  Subjective:    Patient ID: Carl Wood, male    DOB: [birth year], 71 y.o.   MRN: [id number]  HPI  Acute visit. Left hand edema. Was working in his shop yesterday on a mattock and couple of slivers of metal punctured through his left hand. He had one puncture left index finger and pulled a piece of metal out. Second puncture site was left third MCP joint region. He did not pull any metal out of that region. He now some swelling dorsum of hand and pain with movement of the left third digit. Last tetanus one year ago. No fever or chills. Patient has tolerated cephalosporins in the past but has reported penicillin allergy.  Past Medical History  Diagnosis Date  . PE (pulmonary embolism)   . Colon polyps   . CHF (congestive heart failure)   . CAD (coronary artery disease)   . Diabetes mellitus   . Diverticulosis of colon   . Hyperlipidemia   . Hypertension   . MI (mitral incompetence)   . Nephrolithiasis   . V-tach    Past Surgical History  Procedure Date  . Back surgery   . Cardiac defibrillator placement     medtronic virtuoso  . Basal cell carcinoma excision     nose  . Knee arthroplasty     reports that he has been smoking Cigarettes.  He has been smoking about .5 packs per day. He has never used smokeless tobacco. His alcohol and drug histories not on file. family history is not on file. Allergies  Allergen Reactions  . Penicillins     REACTION: unspecified  . Sulfamethoxazole     REACTION: unspecified      Review of Systems  Constitutional: Negative for fever and chills.  Hematological: Negative for adenopathy.       Objective:   Physical Exam  Constitutional: He appears well-developed and well-nourished.  Cardiovascular: Normal rate.   Pulmonary/Chest: Breath sounds normal. No respiratory distress. He has no wheezes. He has no rales.  Musculoskeletal:       Patient has small puncture wound left index finger. No surrounding erythema or warmth. No  tenderness. Dorsum left hand left third MCP joint small puncture wound. No metallic foreign body palpated or seen. He has pain with flexion of the left third digit but is able to flex and fully extend. He has mild erythema surrounding MCP joint and dorsum left hand and over area about 3 x 3 cm          Assessment & Plan:  Metallic puncture left hand. ? Early cellulitis. Rule out retained foreign body. X-rays ordered. Start Keflex 500 mg 3 times a day for 10 days. Frequent elevation. Tetanus up-to-date. Follow up promptly for increased redness or swelling. Ortho refer if foreign body noted.  X-ray shows metallic foreign body near L 3rd MCP joint.  Refer Hydrographic surveyor.

## 2012-02-16 NOTE — Patient Instructions (Signed)
Elevate hand frequently. Follow up promptly for any increased swelling or increased redness.

## 2012-02-17 ENCOUNTER — Ambulatory Visit (INDEPENDENT_AMBULATORY_CARE_PROVIDER_SITE_OTHER): Payer: Medicare Other | Admitting: Internal Medicine

## 2012-02-17 ENCOUNTER — Encounter: Payer: Self-pay | Admitting: Internal Medicine

## 2012-02-17 VITALS — BP 122/58 | HR 62 | Resp 18 | Ht 72.0 in | Wt 200.1 lb

## 2012-02-17 DIAGNOSIS — I428 Other cardiomyopathies: Secondary | ICD-10-CM

## 2012-02-17 DIAGNOSIS — I472 Ventricular tachycardia, unspecified: Secondary | ICD-10-CM

## 2012-02-17 DIAGNOSIS — I4729 Other ventricular tachycardia: Secondary | ICD-10-CM

## 2012-02-17 DIAGNOSIS — I509 Heart failure, unspecified: Secondary | ICD-10-CM

## 2012-02-17 DIAGNOSIS — Z95810 Presence of automatic (implantable) cardiac defibrillator: Secondary | ICD-10-CM

## 2012-02-17 DIAGNOSIS — I2589 Other forms of chronic ischemic heart disease: Secondary | ICD-10-CM

## 2012-02-17 LAB — ICD DEVICE OBSERVATION
BATTERY VOLTAGE: 3.11 V
BRDY-0002RV: 40 {beats}/min
CHARGE TIME: 9.699 s
HV IMPEDENCE: 76 Ohm
RV LEAD AMPLITUDE: 8.8026 mv
RV LEAD IMPEDENCE ICD: 504 Ohm
RV LEAD THRESHOLD: 1 V
TOT-0006: 20090817000000
TZAT-0011SLOWVT: 10 ms
TZAT-0011SLOWVT: 10 ms
TZAT-0012SLOWVT: 200 ms
TZAT-0012SLOWVT: 200 ms
TZAT-0018SLOWVT: NEGATIVE
TZAT-0018SLOWVT: NEGATIVE
TZAT-0019SLOWVT: 8 V
TZAT-0019SLOWVT: 8 V
TZAT-0020FASTVT: 1.5 ms
TZON-0003SLOWVT: 330 ms
TZON-0005SLOWVT: 20
TZST-0001FASTVT: 3
TZST-0001FASTVT: 5
TZST-0001SLOWVT: 4
TZST-0002FASTVT: NEGATIVE
TZST-0003SLOWVT: 35 J
VENTRICULAR PACING ICD: 1.34 pct

## 2012-02-17 NOTE — Assessment & Plan Note (Signed)
Stable. Will follow.  

## 2012-02-17 NOTE — Assessment & Plan Note (Signed)
The patient's device was interrogated.  The information was reviewed. No changes were made in the programming.    

## 2012-02-17 NOTE — Patient Instructions (Signed)
Your physician recommends that you have lab work for [name] with your next scheduled labs for Dr. Cato Mulligan. An order has been put in the computer for a Digoxin level. Please remind Dr. Cato Mulligan office of this when you go for your labs with him.  Your physician has recommended you make the following change in your medication:  1) Stop Aspirin.  Your physician recommends that you schedule a follow-up appointment in: 3 months with Kristin/Paula for a device check.  Your physician wants you to follow-up in: 1 year with [name]. You will receive a reminder letter in the mail two months in advance. If you don't receive a letter, please call our office to schedule the follow-up appointment.

## 2012-02-17 NOTE — Assessment & Plan Note (Addendum)
Stable.  Check dig level  

## 2012-02-17 NOTE — Assessment & Plan Note (Signed)
Continue current meds;  Will stop asa

## 2012-02-17 NOTE — Progress Notes (Signed)
  HPI  Carl Wood is a 71 y.o. male is seen in followup for ischemic heart disease with sn ICD implanted for ventricular tachycardia. He has history of bypass grafting and depressed left ventricular function. He has a history of recurrent ventricular tachycardia--most recently polymorphic in september 2101 associated with low-normal magnesium.  He underwent a Myoview scanning demonstrated ejection fraction of 27% without ischemia but with a large LAD infarct. This was unchanged.  The patient denies SOB,  Exertional chest pain, edema or palpitations  Less fatigue   Past Medical History  Diagnosis Date  . PE (pulmonary embolism)   . Colon polyps   . CHF (congestive heart failure)   . CAD (coronary artery disease)   . Diabetes mellitus   . Diverticulosis of colon   . Hyperlipidemia   . Hypertension   . MI (mitral incompetence)   . Nephrolithiasis   . V-tach     Past Surgical History  Procedure Date  . Back surgery   . Cardiac defibrillator placement     medtronic virtuoso  . Basal cell carcinoma excision     nose  . Knee arthroplasty     Current Outpatient Prescriptions  Medication Sig Dispense Refill  . albuterol (PROVENTIL,VENTOLIN) 90 MCG/ACT inhaler Inhale 2 puffs into the lungs every 6 (six) hours as needed.        Marland Kitchen aspirin 325 MG tablet Take 325 mg by mouth daily.        Marland Kitchen atorvastatin (LIPITOR) 80 MG tablet TAKE ONE TABLET BY MOUTH EVERY DAY AS DIRECTED  30 tablet  5  . carvedilol (COREG) 6.25 MG tablet TAKE ONE TABLET BY MOUTH TWICE DAILY  60 tablet  2  . cephALEXin (KEFLEX) 500 MG capsule Take 1 capsule (500 mg total) by mouth 3 (three) times daily.  21 capsule  0  . enalapril (VASOTEC) 10 MG tablet TAKE ONE TABLET BY MOUTH TWICE DAILY  60 tablet  10  . fish oil-omega-3 fatty acids 1000 MG capsule Take 2 g by mouth daily.        . fluticasone (FLONASE) 50 MCG/ACT nasal spray USE TWO SPRAY IN EACH NOSTRIL EVERY DAY  16 g  0  . LANOXIN 0.25 MG tablet TAKE  ONE-HALF TABLET BY MOUTH EVERY DAY  15 each  5  . warfarin (COUMADIN) 5 MG tablet TAKE AS DIRECTED  30 tablet  11    Allergies  Allergen Reactions  . Penicillins     REACTION: unspecified  . Sulfamethoxazole     REACTION: unspecified    Review of Systems negative except from HPI and PMH  Physical Exam BP 122/58  Pulse 62  Resp 18  Ht 6' (1.829 m)  Wt 200 lb 1.9 oz (90.774 kg)  BMI 27.14 kg/m2 Well developed and nourished in no acute distress HENT normal Neck supple with JVP-flat Clear Regular rate and rhythm, no murmurs or gallops Abd-soft with active BS No Clubbing cyanosis edema Skin-warm and dry A & Oriented  Grossly normal sensory and motor function   Assessment and  Plan

## 2012-02-25 ENCOUNTER — Other Ambulatory Visit: Payer: Self-pay | Admitting: Internal Medicine

## 2012-02-25 ENCOUNTER — Other Ambulatory Visit: Payer: Self-pay

## 2012-02-25 MED ORDER — ENALAPRIL MALEATE 10 MG PO TABS: 10.0000 mg | ORAL_TABLET | Freq: Two times a day (BID) | ORAL | Status: DC

## 2012-03-07 ENCOUNTER — Ambulatory Visit (INDEPENDENT_AMBULATORY_CARE_PROVIDER_SITE_OTHER): Payer: Medicare Other | Admitting: *Deleted

## 2012-03-07 DIAGNOSIS — Z7901 Long term (current) use of anticoagulants: Secondary | ICD-10-CM

## 2012-03-07 DIAGNOSIS — I82409 Acute embolism and thrombosis of unspecified deep veins of unspecified lower extremity: Secondary | ICD-10-CM

## 2012-03-07 LAB — POCT INR: INR: 2.7

## 2012-03-07 NOTE — Patient Instructions (Signed)
  Latest dosing instructions   Total Sun Mon Tue Wed Thu Fri Sat   32.5 2.5 mg 5 mg 5 mg 5 mg 5 mg 5 mg 5 mg    (5 mg0.5) (5 mg1) (5 mg1) (5 mg1) (5 mg1) (5 mg1) (5 mg1)        

## 2012-03-13 ENCOUNTER — Other Ambulatory Visit: Payer: Self-pay | Admitting: Internal Medicine

## 2012-04-06 ENCOUNTER — Ambulatory Visit (INDEPENDENT_AMBULATORY_CARE_PROVIDER_SITE_OTHER): Payer: Medicare Other | Admitting: Internal Medicine

## 2012-04-06 DIAGNOSIS — I82409 Acute embolism and thrombosis of unspecified deep veins of unspecified lower extremity: Secondary | ICD-10-CM

## 2012-04-06 NOTE — Patient Instructions (Signed)
  Latest dosing instructions   Total Sun Mon Tue Wed Thu Fri Sat   32.5 2.5 mg 5 mg 5 mg 5 mg 5 mg 5 mg 5 mg    (5 mg0.5) (5 mg1) (5 mg1) (5 mg1) (5 mg1) (5 mg1) (5 mg1)        

## 2012-04-17 ENCOUNTER — Other Ambulatory Visit: Payer: Medicare Other

## 2012-04-17 ENCOUNTER — Other Ambulatory Visit (INDEPENDENT_AMBULATORY_CARE_PROVIDER_SITE_OTHER): Payer: Medicare Other

## 2012-04-17 DIAGNOSIS — I2510 Atherosclerotic heart disease of native coronary artery without angina pectoris: Secondary | ICD-10-CM

## 2012-04-17 DIAGNOSIS — I428 Other cardiomyopathies: Secondary | ICD-10-CM

## 2012-04-17 DIAGNOSIS — E119 Type 2 diabetes mellitus without complications: Secondary | ICD-10-CM

## 2012-04-17 DIAGNOSIS — I509 Heart failure, unspecified: Secondary | ICD-10-CM

## 2012-04-17 DIAGNOSIS — E785 Hyperlipidemia, unspecified: Secondary | ICD-10-CM

## 2012-04-17 DIAGNOSIS — IMO0001 Reserved for inherently not codable concepts without codable children: Secondary | ICD-10-CM

## 2012-04-17 LAB — BASIC METABOLIC PANEL
BUN: 16 mg/dL (ref 6–23)
Calcium: 8.7 mg/dL (ref 8.4–10.5)
GFR: 100.54 mL/min (ref >60.00)
Glucose, Bld: 109 mg/dL — ABNORMAL HIGH (ref 70–99)
Sodium: 142 mEq/L (ref 135–145)

## 2012-04-17 LAB — LIPID PANEL
Total CHOL/HDL Ratio: 3
Triglycerides: 55 mg/dL (ref 0.0–149.0)

## 2012-04-17 LAB — CBC WITH DIFFERENTIAL/PLATELET
Basophils Absolute: 0 10*3/uL (ref 0.0–0.1)
Eosinophils Absolute: 0.2 10*3/uL (ref 0.0–0.7)
HCT: 45.6 % (ref 39.0–52.0)
Lymphs Abs: 1.5 10*3/uL (ref 0.7–4.0)
MCV: 101.9 fl — ABNORMAL HIGH (ref 78.0–100.0)
Monocytes Absolute: 0.6 10*3/uL (ref 0.1–1.0)
Platelets: 155 10*3/uL (ref 150.0–400.0)
RDW: 13.4 % (ref 11.5–14.6)

## 2012-04-17 LAB — HEPATIC FUNCTION PANEL
AST: 27 U/L (ref 0–37)
Alkaline Phosphatase: 68 U/L (ref 39–117)
Total Bilirubin: 0.8 mg/dL (ref 0.3–1.2)

## 2012-04-18 LAB — DIGOXIN LEVEL: Digoxin Level: 0.6 ng/mL — ABNORMAL LOW (ref 0.8–2.0)

## 2012-04-24 ENCOUNTER — Ambulatory Visit: Payer: Medicare Other | Admitting: Internal Medicine

## 2012-04-24 ENCOUNTER — Other Ambulatory Visit: Payer: Medicare Other

## 2012-05-02 ENCOUNTER — Ambulatory Visit (INDEPENDENT_AMBULATORY_CARE_PROVIDER_SITE_OTHER): Payer: Medicare Other | Admitting: Internal Medicine

## 2012-05-02 VITALS — BP 104/62 | HR 72 | Temp 98.4°F | Wt 195.0 lb

## 2012-05-02 DIAGNOSIS — I472 Ventricular tachycardia: Secondary | ICD-10-CM

## 2012-05-02 DIAGNOSIS — I2589 Other forms of chronic ischemic heart disease: Secondary | ICD-10-CM

## 2012-05-02 DIAGNOSIS — I2510 Atherosclerotic heart disease of native coronary artery without angina pectoris: Secondary | ICD-10-CM

## 2012-05-02 DIAGNOSIS — I82409 Acute embolism and thrombosis of unspecified deep veins of unspecified lower extremity: Secondary | ICD-10-CM

## 2012-05-02 DIAGNOSIS — I4729 Other ventricular tachycardia: Secondary | ICD-10-CM

## 2012-05-02 DIAGNOSIS — I10 Essential (primary) hypertension: Secondary | ICD-10-CM

## 2012-05-02 DIAGNOSIS — E119 Type 2 diabetes mellitus without complications: Secondary | ICD-10-CM

## 2012-05-02 NOTE — Assessment & Plan Note (Signed)
Doing well, no compliants BP well controlled continue meds

## 2012-05-02 NOTE — Assessment & Plan Note (Signed)
No sxs- denies SOB and DOE He has no angina

## 2012-05-02 NOTE — Patient Instructions (Signed)
  Latest dosing instructions   Total Sun Mon Tue Wed Thu Fri Sat   32.5 2.5 mg 5 mg 5 mg 5 mg 5 mg 5 mg 5 mg    (5 mg0.5) (5 mg1) (5 mg1) (5 mg1) (5 mg1) (5 mg1) (5 mg1)        

## 2012-05-02 NOTE — Assessment & Plan Note (Signed)
Well controlled, continue mes

## 2012-05-02 NOTE — Assessment & Plan Note (Signed)
Has icd-- followed by EP

## 2012-05-03 NOTE — Progress Notes (Signed)
Patient ID: Carl Wood, male   DOB: 11-28-1941, 71 y.o.   MRN: [id number]  CAD-- no sxs on current meds  htn-- tolerating meds  Lipids, tolerating meds  Chronic anticoagulation--no bleeding  Past Medical History  Diagnosis Date  . PE (pulmonary embolism)   . Colon polyps   . CHF (congestive heart failure)   . CAD (coronary artery disease)   . Diabetes mellitus   . Diverticulosis of colon   . Hyperlipidemia   . Hypertension   . MI (mitral incompetence)   . Nephrolithiasis   . V-tach     History   Social History  . Marital Status: Married    Spouse Name: N/A    Number of Children: N/A  . Years of Education: N/A   Occupational History  . Not on file.   Social History Main Topics  . Smoking status: Current Everyday Smoker -- 0.5 packs/day    Types: Cigarettes  . Smokeless tobacco: Never Used  . Alcohol Use: Not on file  . Drug Use: Not on file  . Sexually Active: Not on file   Other Topics Concern  . Not on file   Social History Narrative  . No narrative on file    Past Surgical History  Procedure Date  . Back surgery   . Cardiac defibrillator placement     medtronic virtuoso  . Basal cell carcinoma excision     nose  . Knee arthroplasty     No family history on file.  Allergies  Allergen Reactions  . Penicillins     REACTION: unspecified  . Sulfamethoxazole     REACTION: unspecified    Current Outpatient Prescriptions on File Prior to Visit  Medication Sig Dispense Refill  . albuterol (PROVENTIL,VENTOLIN) 90 MCG/ACT inhaler Inhale 2 puffs into the lungs every 6 (six) hours as needed.        Marland Kitchen atorvastatin (LIPITOR) 80 MG tablet TAKE ONE TABLET BY MOUTH EVERY DAY AS DIRECTED  30 tablet  5  . carvedilol (COREG) 6.25 MG tablet TAKE ONE TABLET BY MOUTH TWICE DAILY  60 tablet  2  . enalapril (VASOTEC) 10 MG tablet Take 1 tablet (10 mg total) by mouth 2 (two) times daily.  60 tablet  10  . fish oil-omega-3 fatty acids 1000 MG capsule Take 2 g  by mouth daily.        . fluticasone (FLONASE) 50 MCG/ACT nasal spray USE TWO SPRAY IN EACH NOSTRIL EVERY DAY  16 g  0  . LANOXIN 0.25 MG tablet TAKE ONE-HALF TABLET BY MOUTH EVERY DAY  15 each  5  . warfarin (COUMADIN) 5 MG tablet 5 mg daily except 2.5 mg on Sunday         patient denies chest pain, shortness of breath, orthopnea. Denies lower extremity edema, abdominal pain, change in appetite, change in bowel movements. Patient denies rashes, musculoskeletal complaints. No other specific complaints in a complete review of systems.   BP 104/62  Pulse 72  Temp(Src) 98.4 F (36.9 C) (Oral)  Wt 195 lb (88.451 kg)  well-developed well-nourished male in no acute distress. HEENT exam atraumatic, normocephalic, neck supple without jugular venous distention. Chest clear to auscultation cardiac exam S1-S2 are regular. Abdominal exam overweight with bowel sounds, soft and nontender. Extremities no edema. Neurologic exam is alert with a normal gait.

## 2012-05-03 NOTE — Assessment & Plan Note (Signed)
No meds a1c is controlled No meds at this time

## 2012-05-08 ENCOUNTER — Other Ambulatory Visit: Payer: Self-pay | Admitting: Internal Medicine

## 2012-05-24 ENCOUNTER — Other Ambulatory Visit: Payer: Self-pay | Admitting: Internal Medicine

## 2012-05-25 ENCOUNTER — Ambulatory Visit (INDEPENDENT_AMBULATORY_CARE_PROVIDER_SITE_OTHER): Payer: Medicare Other | Admitting: *Deleted

## 2012-05-25 ENCOUNTER — Encounter: Payer: Self-pay | Admitting: Internal Medicine

## 2012-05-25 DIAGNOSIS — I4729 Other ventricular tachycardia: Secondary | ICD-10-CM

## 2012-05-25 DIAGNOSIS — I509 Heart failure, unspecified: Secondary | ICD-10-CM

## 2012-05-25 DIAGNOSIS — I472 Ventricular tachycardia, unspecified: Secondary | ICD-10-CM

## 2012-05-25 LAB — ICD DEVICE OBSERVATION
BATTERY VOLTAGE: 3.09 V
BRDY-0002RV: 40 {beats}/min
FVT: 0
HV IMPEDENCE: 76 Ohm
PACEART VT: 0
RV LEAD IMPEDENCE ICD: 496 Ohm
RV LEAD THRESHOLD: 1 V
TOT-0006: 20090817000000
TZAT-0001FASTVT: 1
TZAT-0001SLOWVT: 1
TZAT-0001SLOWVT: 2
TZAT-0002FASTVT: NEGATIVE
TZAT-0005SLOWVT: 88 pct
TZAT-0005SLOWVT: 91 pct
TZAT-0011SLOWVT: 10 ms
TZAT-0012SLOWVT: 200 ms
TZAT-0013SLOWVT: 1
TZAT-0018SLOWVT: NEGATIVE
TZAT-0019FASTVT: 8 V
TZAT-0019SLOWVT: 8 V
TZAT-0020SLOWVT: 1.5 ms
TZAT-0020SLOWVT: 1.5 ms
TZON-0003VSLOWVT: 430 ms
TZON-0004SLOWVT: 28
TZON-0004VSLOWVT: 32
TZON-0005SLOWVT: 20
TZST-0001FASTVT: 3
TZST-0001FASTVT: 4
TZST-0001FASTVT: 5
TZST-0001SLOWVT: 4
TZST-0001SLOWVT: 5
TZST-0002FASTVT: NEGATIVE
TZST-0002FASTVT: NEGATIVE
TZST-0002FASTVT: NEGATIVE
TZST-0002FASTVT: NEGATIVE
TZST-0003SLOWVT: 25 J
TZST-0003SLOWVT: 35 J
TZST-0003SLOWVT: 35 J

## 2012-05-25 NOTE — Progress Notes (Signed)
defib check in clinic  

## 2012-06-01 ENCOUNTER — Encounter: Payer: Medicare Other | Admitting: Family

## 2012-06-06 ENCOUNTER — Other Ambulatory Visit: Payer: Self-pay | Admitting: Internal Medicine

## 2012-06-07 ENCOUNTER — Encounter: Payer: Medicare Other | Admitting: Family

## 2012-06-13 ENCOUNTER — Ambulatory Visit (INDEPENDENT_AMBULATORY_CARE_PROVIDER_SITE_OTHER): Payer: Medicare Other | Admitting: Family

## 2012-06-13 DIAGNOSIS — I82409 Acute embolism and thrombosis of unspecified deep veins of unspecified lower extremity: Secondary | ICD-10-CM

## 2012-06-13 DIAGNOSIS — I509 Heart failure, unspecified: Secondary | ICD-10-CM

## 2012-06-13 DIAGNOSIS — Z86718 Personal history of other venous thrombosis and embolism: Secondary | ICD-10-CM

## 2012-06-13 LAB — POCT INR: INR: 2.1

## 2012-06-13 NOTE — Patient Instructions (Addendum)
2.5 mg (1/2 pill) on sundays 5 mg (1 pill) on other days,  Check in 6 weeks    Latest dosing instructions   Total Sun Mon Tue Wed Thu Fri Sat   32.5 2.5 mg 5 mg 5 mg 5 mg 5 mg 5 mg 5 mg    (5 mg0.5) (5 mg1) (5 mg1) (5 mg1) (5 mg1) (5 mg1) (5 mg1)

## 2012-06-27 ENCOUNTER — Other Ambulatory Visit: Payer: Self-pay | Admitting: Internal Medicine

## 2012-07-20 ENCOUNTER — Ambulatory Visit (INDEPENDENT_AMBULATORY_CARE_PROVIDER_SITE_OTHER): Payer: Medicare Other | Admitting: Family

## 2012-07-20 DIAGNOSIS — I509 Heart failure, unspecified: Secondary | ICD-10-CM

## 2012-07-20 DIAGNOSIS — Z86718 Personal history of other venous thrombosis and embolism: Secondary | ICD-10-CM

## 2012-07-20 LAB — POCT INR: INR: 3.5

## 2012-07-20 NOTE — Patient Instructions (Addendum)
  Latest dosing instructions   Total Sun Mon Tue Wed Thu Fri Sat   32.5 2.5 mg 5 mg 5 mg 5 mg 5 mg 5 mg 5 mg    (5 mg0.5) (5 mg1) (5 mg1) (5 mg1) (5 mg1) (5 mg1) (5 mg1)       Hold dose today. 2.5 mg (1/2 pill) on sundays 5 mg (1 pill) on other days,  Check in 4 weeks

## 2012-08-01 ENCOUNTER — Other Ambulatory Visit: Payer: Self-pay | Admitting: Internal Medicine

## 2012-08-17 ENCOUNTER — Ambulatory Visit (INDEPENDENT_AMBULATORY_CARE_PROVIDER_SITE_OTHER): Payer: Medicare Other | Admitting: Family

## 2012-08-17 DIAGNOSIS — I509 Heart failure, unspecified: Secondary | ICD-10-CM

## 2012-08-17 DIAGNOSIS — Z86718 Personal history of other venous thrombosis and embolism: Secondary | ICD-10-CM

## 2012-08-17 NOTE — Patient Instructions (Signed)
Take 1/2 tab only. 2.5 mg (1/2 pill) on sundays and Wednesday. Then 5 mg (1 pill) on other days,  Check in 4 weeks    Latest dosing instructions   Total Sun Mon Tue Wed Thu Fri Sat   30 2.5 mg 5 mg 5 mg 2.5 mg 5 mg 5 mg 5 mg    (5 mg0.5) (5 mg1) (5 mg1) (5 mg0.5) (5 mg1) (5 mg1) (5 mg1)

## 2012-08-25 ENCOUNTER — Encounter: Payer: Self-pay | Admitting: Gastroenterology

## 2012-08-28 ENCOUNTER — Ambulatory Visit (INDEPENDENT_AMBULATORY_CARE_PROVIDER_SITE_OTHER): Payer: Medicare Other | Admitting: *Deleted

## 2012-08-28 ENCOUNTER — Encounter: Payer: Self-pay | Admitting: Internal Medicine

## 2012-08-28 DIAGNOSIS — I509 Heart failure, unspecified: Secondary | ICD-10-CM

## 2012-08-28 DIAGNOSIS — I4729 Other ventricular tachycardia: Secondary | ICD-10-CM

## 2012-08-28 DIAGNOSIS — I472 Ventricular tachycardia: Secondary | ICD-10-CM

## 2012-08-28 LAB — ICD DEVICE OBSERVATION
BATTERY VOLTAGE: 3.09 V
BRDY-0002RV: 40 {beats}/min
CHARGE TIME: 9.769 s
HV IMPEDENCE: 82 Ohm
RV LEAD AMPLITUDE: 9.1 mv
RV LEAD IMPEDENCE ICD: 512 Ohm
RV LEAD THRESHOLD: 1 V
TOT-0006: 20090817000000
TZAT-0001FASTVT: 1
TZAT-0011SLOWVT: 10 ms
TZAT-0011SLOWVT: 10 ms
TZAT-0012FASTVT: 200 ms
TZAT-0012SLOWVT: 200 ms
TZAT-0012SLOWVT: 200 ms
TZAT-0018SLOWVT: NEGATIVE
TZAT-0018SLOWVT: NEGATIVE
TZAT-0019SLOWVT: 8 V
TZAT-0019SLOWVT: 8 V
TZAT-0020SLOWVT: 1.5 ms
TZAT-0020SLOWVT: 1.5 ms
TZON-0003SLOWVT: 330 ms
TZON-0003VSLOWVT: 430 ms
TZON-0005SLOWVT: 20
TZST-0001FASTVT: 3
TZST-0001FASTVT: 4
TZST-0001FASTVT: 5
TZST-0001SLOWVT: 4
TZST-0001SLOWVT: 5
TZST-0002FASTVT: NEGATIVE
TZST-0002FASTVT: NEGATIVE
TZST-0003SLOWVT: 25 J
TZST-0003SLOWVT: 35 J
TZST-0003SLOWVT: 35 J
VF: 0

## 2012-08-28 NOTE — Progress Notes (Signed)
defib check in clinic  

## 2012-09-12 ENCOUNTER — Encounter: Payer: Self-pay | Admitting: Gastroenterology

## 2012-09-14 ENCOUNTER — Ambulatory Visit (INDEPENDENT_AMBULATORY_CARE_PROVIDER_SITE_OTHER): Payer: Medicare Other | Admitting: Family

## 2012-09-14 DIAGNOSIS — I509 Heart failure, unspecified: Secondary | ICD-10-CM

## 2012-09-14 DIAGNOSIS — I82409 Acute embolism and thrombosis of unspecified deep veins of unspecified lower extremity: Secondary | ICD-10-CM

## 2012-09-14 DIAGNOSIS — Z86718 Personal history of other venous thrombosis and embolism: Secondary | ICD-10-CM

## 2012-09-14 NOTE — Patient Instructions (Signed)
2.5 mg (1/2 pill) on sundays and Wednesday. Then 5 mg (1 pill) on other days,  Check in 4 weeks    Latest dosing instructions   Total Sun Mon Tue Wed Thu Fri Sat   30 2.5 mg 5 mg 5 mg 2.5 mg 5 mg 5 mg 5 mg    (5 mg0.5) (5 mg1) (5 mg1) (5 mg0.5) (5 mg1) (5 mg1) (5 mg1)

## 2012-09-26 ENCOUNTER — Encounter: Payer: Self-pay | Admitting: Family Medicine

## 2012-09-26 ENCOUNTER — Ambulatory Visit (INDEPENDENT_AMBULATORY_CARE_PROVIDER_SITE_OTHER): Payer: Medicare Other | Admitting: Family Medicine

## 2012-09-26 VITALS — BP 110/70 | HR 82 | Temp 97.8°F | Wt 196.0 lb

## 2012-09-26 DIAGNOSIS — R062 Wheezing: Secondary | ICD-10-CM

## 2012-09-26 DIAGNOSIS — J069 Acute upper respiratory infection, unspecified: Secondary | ICD-10-CM

## 2012-09-26 MED ORDER — BENZONATATE 100 MG PO CAPS: 100.0000 mg | ORAL_CAPSULE | Freq: Three times a day (TID) | ORAL | Status: DC | PRN

## 2012-09-26 MED ORDER — CEFUROXIME AXETIL 250 MG PO TABS: 250.0000 mg | ORAL_TABLET | Freq: Two times a day (BID) | ORAL | Status: DC

## 2012-09-26 MED ORDER — ALBUTEROL SULFATE HFA 108 (90 BASE) MCG/ACT IN AERS: 2.0000 | INHALATION_SPRAY | Freq: Four times a day (QID) | RESPIRATORY_TRACT | Status: DC | PRN

## 2012-09-26 NOTE — Progress Notes (Signed)
Chief Complaint  Patient presents with  . Cough    congestion, wheezing x 2 weeks     HPI: -started: 2 weeks ago -symptoms:nasal congestion, sore throat, cough, drainage in throat, occ has wheezing with sinus infection - reports has used symbicort in the past for this -denies:fever, SOB, NVD, tooth pain, strep or mono exposure -has tried: nothing -sick contacts: none   ROS: See pertinent positives and negatives per HPI.  Past Medical History  Diagnosis Date  . PE (pulmonary embolism)   . Colon polyps   . CHF (congestive heart failure)   . CAD (coronary artery disease)   . Diabetes mellitus   . Diverticulosis of colon   . Hyperlipidemia   . Hypertension   . MI (mitral incompetence)   . Nephrolithiasis   . V-tach     No family history on file.  History   Social History  . Marital Status: Married    Spouse Name: N/A    Number of Children: N/A  . Years of Education: N/A   Social History Main Topics  . Smoking status: Current Every Day Smoker -- 0.5 packs/day    Types: Cigarettes  . Smokeless tobacco: Never Used  . Alcohol Use: None  . Drug Use: None  . Sexually Active: None   Other Topics Concern  . None   Social History Narrative  . None    Current outpatient prescriptions:atorvastatin (LIPITOR) 80 MG tablet, TAKE ONE TABLET BY MOUTH EVERY DAY AS DIRECTED, Disp: 30 tablet, Rfl: 5;  carvedilol (COREG) 6.25 MG tablet, TAKE ONE TABLET BY MOUTH TWICE DAILY, Disp: 180 tablet, Rfl: 3;  enalapril (VASOTEC) 10 MG tablet, Take 1 tablet (10 mg total) by mouth 2 (two) times daily., Disp: 60 tablet, Rfl: 10;  fish oil-omega-3 fatty acids 1000 MG capsule, Take 2 g by mouth daily.  , Disp: , Rfl:  fluticasone (FLONASE) 50 MCG/ACT nasal spray, USE TWO SPRAY IN EACH NOSTRIL EVERY DAY, Disp: 16 g, Rfl: 0;  LANOXIN 0.25 MG tablet, TAKE ONE-HALF TABLET BY MOUTH EVERY DAY, Disp: 15 each, Rfl: 3;  warfarin (COUMADIN) 5 MG tablet, 5 mg daily except 2.5 mg on Sunday, Disp: , Rfl: ;   warfarin (COUMADIN) 5 MG tablet, TAKE AS DIRECTED, Disp: 30 tablet, Rfl: 11 albuterol (PROVENTIL HFA;VENTOLIN HFA) 108 (90 BASE) MCG/ACT inhaler, Inhale 2 puffs into the lungs every 6 (six) hours as needed for wheezing., Disp: 1 Inhaler, Rfl: 0;  albuterol (PROVENTIL,VENTOLIN) 90 MCG/ACT inhaler, Inhale 2 puffs into the lungs every 6 (six) hours as needed.  , Disp: , Rfl:  benzonatate (TESSALON PERLES) 100 MG capsule, Take 1 capsule (100 mg total) by mouth 3 (three) times daily as needed for cough., Disp: 20 capsule, Rfl: 0;  cefUROXime (CEFTIN) 250 MG tablet, Take 1 tablet (250 mg total) by mouth 2 (two) times daily., Disp: 20 tablet, Rfl: 0  EXAM:  Filed Vitals:   09/26/12 1418  BP: 110/70  Pulse: 82  Temp: 97.8 F (36.6 C)    There is no height on file to calculate BMI.  GENERAL: vitals reviewed and listed above, alert, oriented, appears well hydrated and in no acute distress  HEENT: atraumatic, conjunttiva clear, no obvious abnormalities on inspection of external nose and ears, normal appearance of ear canals and TMs, clear nasal congestion, mild post oropharyngeal erythema with PND, no tonsillar edema or exudate, no sinus TTP  NECK: no obvious masses on inspection  LUNGS: scattered wheezes, rales or rhonchi, good air movement  CV: HRRR, no peripheral edema  MS: moves all extremities without noticeable abnormality  PSYCH: pleasant and cooperative, no obvious depression or anxiety  ASSESSMENT AND PLAN:  Discussed the following assessment and plan:  1. Upper respiratory infection  albuterol (PROVENTIL HFA;VENTOLIN HFA) 108 (90 BASE) MCG/ACT inhaler, benzonatate (TESSALON PERLES) 100 MG capsule, cefUROXime (CEFTIN) 250 MG tablet  2. Wheezing  albuterol (PROVENTIL HFA;VENTOLIN HFA) 108 (90 BASE) MCG/ACT inhaler   -symptoms for > 2weeks, could not get in to see PCP. Abx warranted - discussed risks and options. Many interactions with coumadin and digoxin. Hx of feeling dizzy after  penicillin, no anaphylaxis or rash. Will try abx above after discussion risks.  -reports wheezing with URI, rx for albuterol and cough medication, has flonase at home -follow up with PCP next available to assess improving lung exam and discuss smoking cessation, advised to quit today but want visit to address this -Patient advised to return or notify a doctor immediately if symptoms worsen or persist or new concerns arise.  Patient Instructions  INSTRUCTIONS FOR UPPER RESPIRATORY INFECTION:  -plenty of rest and fluids  -nasal saline wash 2-3 times daily (use prepackaged nasal saline or bottled/distilled water if making your own)   -clean nose with nasal saline before using the nasal steroid or sinex  -can use sinex nasal spray for drainage and nasal congestion - but do NOT use longer then 3-4 days  -can use tylenol or ibuprofen as directed for aches and sorethroat  -in the winter time, using a humidifier at night is helpful (please follow cleaning instructions)  -if you are taking a cough medication - use only as directed, may also try a teaspoon of honey to coat the throat and throat lozenges  -for sore throat, salt water gargles can help  -follow up if you have fevers, are worsening or not getting better in 5-7 days      KIM, HANNAH R.

## 2012-09-26 NOTE — Patient Instructions (Addendum)
INSTRUCTIONS FOR UPPER RESPIRATORY INFECTION:  -take antibiotic as instructed   -follow up with your doctor at next available appointment to help you with quitting smoking  -plenty of rest and fluids  -nasal saline wash 2-3 times daily (use prepackaged nasal saline or bottled/distilled water if making your own)   -clean nose with nasal saline before using the nasal steroid   -in the winter time, using a humidifier at night is helpful (please follow cleaning instructions)  -if you are taking a cough medication - use only as directed, may also try a teaspoon of honey to coat the throat and throat lozenges  -for sore throat, salt water gargles can help  -follow up if you have fevers, are worsening or not getting better in 5-7 days

## 2012-10-09 ENCOUNTER — Other Ambulatory Visit: Payer: Self-pay | Admitting: Internal Medicine

## 2012-10-12 ENCOUNTER — Ambulatory Visit (INDEPENDENT_AMBULATORY_CARE_PROVIDER_SITE_OTHER): Payer: Medicare Other | Admitting: Family

## 2012-10-12 DIAGNOSIS — I509 Heart failure, unspecified: Secondary | ICD-10-CM

## 2012-10-12 DIAGNOSIS — Z86718 Personal history of other venous thrombosis and embolism: Secondary | ICD-10-CM

## 2012-10-12 LAB — POCT INR: INR: 3

## 2012-10-12 NOTE — Patient Instructions (Addendum)
  Latest dosing instructions   Total Sun Mon Tue Wed Thu Fri Sat   30 2.5 mg 5 mg 5 mg 2.5 mg 5 mg 5 mg 5 mg    (5 mg0.5) (5 mg1) (5 mg1) (5 mg0.5) (5 mg1) (5 mg1) (5 mg1)       Eat an extra serving of greens. 2.5 mg (1/2 pill) on sundays and Wednesday. Then 5 mg (1 pill) on other days,  Check in 4 weeks

## 2012-10-16 ENCOUNTER — Telehealth: Payer: Self-pay | Admitting: *Deleted

## 2012-10-16 ENCOUNTER — Ambulatory Visit: Payer: Medicare Other | Admitting: Gastroenterology

## 2012-10-16 ENCOUNTER — Ambulatory Visit (INDEPENDENT_AMBULATORY_CARE_PROVIDER_SITE_OTHER): Payer: Medicare Other | Admitting: Gastroenterology

## 2012-10-16 ENCOUNTER — Encounter: Payer: Self-pay | Admitting: Gastroenterology

## 2012-10-16 VITALS — BP 110/60 | HR 64 | Ht 71.0 in | Wt 197.4 lb

## 2012-10-16 DIAGNOSIS — D689 Coagulation defect, unspecified: Secondary | ICD-10-CM

## 2012-10-16 DIAGNOSIS — D6832 Hemorrhagic disorder due to extrinsic circulating anticoagulants: Secondary | ICD-10-CM

## 2012-10-16 DIAGNOSIS — T45515A Adverse effect of anticoagulants, initial encounter: Secondary | ICD-10-CM

## 2012-10-16 DIAGNOSIS — Z800 Family history of malignant neoplasm of digestive organs: Secondary | ICD-10-CM | POA: Insufficient documentation

## 2012-10-16 MED ORDER — NA SULFATE-K SULFATE-MG SULF 17.5-3.13-1.6 GM/177ML PO SOLN: 1.0000 | Freq: Once | ORAL | Status: DC

## 2012-10-16 NOTE — Assessment & Plan Note (Signed)
Patient will be scheduled for screening colonoscopy. Coumadin will be held in advance of the procedure provided it is approved by his PCP

## 2012-10-16 NOTE — Progress Notes (Signed)
History of Present Illness: Carl Wood 71 year old white male referred at the request of Dr. Cato Mulligan for screening colonoscopy. Family history is pertinent for father who had colon cancer in his 39s. The patient's last colonoscopy in 2006 demonstrated diverticulosis and hemorrhoids. He is on Coumadin. He has no GI complaints including change of bowel habits, melena or hematochezia.   Past Medical History  Diagnosis Date  . PE (pulmonary embolism)   . Colon polyps   . CHF (congestive heart failure)   . CAD (coronary artery disease)   . Diabetes mellitus   . Diverticulosis of colon   . Hyperlipidemia   . Hypertension   . MI (mitral incompetence)   . Nephrolithiasis   . V-tach    Past Surgical History  Procedure Date  . Back surgery   . Cardiac defibrillator placement     medtronic virtuoso  . Basal cell carcinoma excision     nose  . Knee arthroplasty    family history includes Bladder Cancer in his mother; Colon cancer in his father; Diabetes in his father; and Heart disease in his father and mother. Current Outpatient Prescriptions  Medication Sig Dispense Refill  . albuterol (PROVENTIL HFA;VENTOLIN HFA) 108 (90 BASE) MCG/ACT inhaler Inhale 2 puffs into the lungs every 6 (six) hours as needed for wheezing.  1 Inhaler  0  . albuterol (PROVENTIL,VENTOLIN) 90 MCG/ACT inhaler Inhale 2 puffs into the lungs every 6 (six) hours as needed.        Marland Kitchen atorvastatin (LIPITOR) 80 MG tablet TAKE ONE TABLET BY MOUTH EVERY DAY AS DIRECTED  30 tablet  5  . carvedilol (COREG) 6.25 MG tablet TAKE ONE TABLET BY MOUTH TWICE DAILY  180 tablet  3  . enalapril (VASOTEC) 10 MG tablet Take 1 tablet (10 mg total) by mouth 2 (two) times daily.  60 tablet  10  . fish oil-omega-3 fatty acids 1000 MG capsule Take 2 g by mouth daily.        . fluticasone (FLONASE) 50 MCG/ACT nasal spray USE TWO SPRAY IN EACH NOSTRIL EVERY DAY  16 g  0  . LANOXIN 0.25 MG tablet TAKE ONE-HALF TABLET BY MOUTH EVERY DAY  15 each  3  .  warfarin (COUMADIN) 5 MG tablet 5 mg daily except 2.5 mg on Sunday and Wednesday      . [DISCONTINUED] warfarin (COUMADIN) 5 MG tablet TAKE AS DIRECTED  30 tablet  11   Allergies as of 10/16/2012 - Review Complete 10/16/2012  Allergen Reaction Noted  . Penicillins  08/20/2005  . Sulfamethoxazole  08/20/2005    reports that he has been smoking Cigarettes.  He has been smoking about .5 packs per day. He has never used smokeless tobacco. He reports that he does not drink alcohol or use illicit drugs.     Review of Systems: He is had a recent upper respiratory infection for which he took antibiotics Pertinent positive and negative review of systems were noted in the above HPI section. All other review of systems were otherwise negative.  Vital signs were reviewed in today's medical record Physical Exam: General: Well developed , well nourished, no acute distress Head: Normocephalic and atraumatic Eyes:  sclerae anicteric, EOMI Ears: Normal auditory acuity Mouth: No deformity or lesions Neck: Supple, no masses or thyromegaly Lungs: There are mild, diffuse expiratory wheezes Heart: Regular rate and rhythm; no murmurs, rubs or bruits Abdomen: Soft, non tender and non distended. No masses, hepatosplenomegaly or hernias noted. Normal Bowel sounds Rectal:deferred Musculoskeletal: Symmetrical  with no gross deformities  Skin: No lesions on visible extremities Pulses:  Normal pulses noted Extremities: No clubbing, cyanosis, edema or deformities noted Neurological: Alert oriented x 4, grossly nonfocal Cervical Nodes:  No significant cervical adenopathy Inguinal Nodes: No significant inguinal adenopathy Psychological:  Alert and cooperative. Normal mood and affect

## 2012-10-16 NOTE — Telephone Encounter (Signed)
[facility] [address] [place] [address] [phone] phone [phone] fax   10/16/2012    RE: Carl Wood DOB: January 02, 1941 MRN: [id number]   DearSwords,  We have scheduled the above patient for an endoscopic procedure. Our records show that he is on anticoagulation therapy.   Please advise as to how long the patient may come off his therapy of coumadin prior to the procedure, which is scheduled for 11/21/2012.  Please fax back/ or route the completed form to Devina Bezold at [phone].   Sincerely,  Merri Ray

## 2012-10-16 NOTE — Patient Instructions (Addendum)
Colonoscopy A colonoscopy is an exam to evaluate your entire colon. In this exam, your colon is cleansed. A long fiberoptic tube is inserted through your rectum and into your colon. The fiberoptic scope (endoscope) is a long bundle of enclosed and very flexible fibers. These fibers transmit light to the area examined and send images from that area to your caregiver. Discomfort is usually minimal. You may be given a drug to help you sleep (sedative) during or prior to the procedure. This exam helps to detect lumps (tumors), polyps, inflammation, and areas of bleeding. Your caregiver may also take a small piece of tissue (biopsy) that will be examined under a microscope. LET YOUR CAREGIVER KNOW ABOUT:   Allergies to food or medicine.  Medicines taken, including vitamins, herbs, eyedrops, over-the-counter medicines, and creams.  Use of steroids (by mouth or creams).  Previous problems with anesthetics or numbing medicines.  History of bleeding problems or blood clots.  Previous surgery.  Other health problems, including diabetes and kidney problems.  Possibility of pregnancy, if this applies.  BEFORE THE PROCEDURE   A clear liquid diet may be required for 2 days before the exam.  Ask your caregiver about changing or stopping your regular medications.  Liquid injections (enemas) or laxatives may be required.  A large amount of electrolyte solution may be given to you to drink over a short period of time. This solution is used to clean out your colon.  You should be present 60 minutes prior to your procedure or as directed by your caregiver. AFTER THE PROCEDURE   If you received a sedative or pain relieving medication, you will need to arrange for someone to drive you home.  Occasionally, there is a little blood passed with the first bowel movement. Do not be concerned. FINDING OUT THE RESULTS OF YOUR TEST Not all test results are available during your visit. If your test results are  not back during the visit, make an appointment with your caregiver to find out the results. Do not assume everything is normal if you have not heard from your caregiver or the medical facility. It is important for you to follow up on all of your test results. HOME CARE INSTRUCTIONS   It is not unusual to pass moderate amounts of gas and experience mild abdominal cramping following the procedure. This is due to air being used to inflate your colon during the exam. Walking or a warm pack on your belly (abdomen) may help.  You may resume all normal meals and activities after sedatives and medicines have worn off.  Only take over-the-counter or prescription medicines for pain, discomfort, or fever as directed by your caregiver. Do not use aspirin or blood thinners if a biopsy was taken. Consult your caregiver for medicine usage if biopsies were taken. SEEK IMMEDIATE MEDICAL CARE IF:   You have a fever.  You pass large blood clots or fill a toilet with blood following the procedure. This may also occur 10 to 14 days following the procedure. This is more likely if a biopsy was taken.  You develop abdominal pain that keeps getting worse and cannot be relieved with medicine. Document Released: 11/19/2000 Document Revised: 02/14/2012 Document Reviewed: 07/04/2008 [facility] Patient Information 2013 [place], [place]. You will be contaced by our office prior to your procedure for directions on holding your Coumadin/Warfarin.  If you do not hear from our office 1 week prior to your scheduled procedure, please call [phone] to discuss.

## 2012-10-16 NOTE — Assessment & Plan Note (Signed)
Check with Dr. Cato Mulligan whether we can hold Coumadin prior to colonoscopy

## 2012-10-16 NOTE — Telephone Encounter (Signed)
May be off warfarin for 5 days prior

## 2012-10-18 NOTE — Telephone Encounter (Signed)
L/M for pt that it is okay for him to hold coumadin 5 days before procedure. Contact the office if he has any questions

## 2012-10-27 ENCOUNTER — Other Ambulatory Visit (INDEPENDENT_AMBULATORY_CARE_PROVIDER_SITE_OTHER): Payer: Medicare Other

## 2012-10-27 DIAGNOSIS — E785 Hyperlipidemia, unspecified: Secondary | ICD-10-CM

## 2012-10-27 DIAGNOSIS — I10 Essential (primary) hypertension: Secondary | ICD-10-CM

## 2012-10-27 DIAGNOSIS — N400 Enlarged prostate without lower urinary tract symptoms: Secondary | ICD-10-CM

## 2012-10-27 DIAGNOSIS — E119 Type 2 diabetes mellitus without complications: Secondary | ICD-10-CM

## 2012-10-27 LAB — HEPATIC FUNCTION PANEL
AST: 20 U/L (ref 0–37)
Alkaline Phosphatase: 73 U/L (ref 39–117)
Total Bilirubin: 1.1 mg/dL (ref 0.3–1.2)

## 2012-10-27 LAB — LIPID PANEL: Total CHOL/HDL Ratio: 4

## 2012-10-27 LAB — BASIC METABOLIC PANEL
BUN: 23 mg/dL (ref 6–23)
CO2: 29 mEq/L (ref 19–32)
Calcium: 9.3 mg/dL (ref 8.4–10.5)
Creatinine, Ser: 0.9 mg/dL (ref 0.4–1.5)
Glucose, Bld: 115 mg/dL — ABNORMAL HIGH (ref 70–99)

## 2012-10-31 ENCOUNTER — Telehealth: Payer: Self-pay | Admitting: Gastroenterology

## 2012-10-31 NOTE — Telephone Encounter (Signed)
Suprep sample left up front for pt to pick up. Pt aware.

## 2012-11-03 ENCOUNTER — Ambulatory Visit: Payer: Medicare Other | Admitting: Internal Medicine

## 2012-11-07 ENCOUNTER — Ambulatory Visit (INDEPENDENT_AMBULATORY_CARE_PROVIDER_SITE_OTHER): Payer: Medicare Other | Admitting: Internal Medicine

## 2012-11-07 ENCOUNTER — Encounter: Payer: Self-pay | Admitting: Internal Medicine

## 2012-11-07 ENCOUNTER — Ambulatory Visit: Payer: Medicare Other | Admitting: Internal Medicine

## 2012-11-07 ENCOUNTER — Ambulatory Visit (INDEPENDENT_AMBULATORY_CARE_PROVIDER_SITE_OTHER): Payer: Medicare Other | Admitting: Family

## 2012-11-07 ENCOUNTER — Other Ambulatory Visit: Payer: Self-pay | Admitting: Internal Medicine

## 2012-11-07 VITALS — BP 114/62 | HR 68 | Temp 98.0°F | Ht 72.0 in | Wt 198.0 lb

## 2012-11-07 DIAGNOSIS — E119 Type 2 diabetes mellitus without complications: Secondary | ICD-10-CM

## 2012-11-07 DIAGNOSIS — Z0000 Encounter for general adult medical examination without abnormal findings: Secondary | ICD-10-CM

## 2012-11-07 DIAGNOSIS — I10 Essential (primary) hypertension: Secondary | ICD-10-CM

## 2012-11-07 DIAGNOSIS — I2589 Other forms of chronic ischemic heart disease: Secondary | ICD-10-CM

## 2012-11-07 DIAGNOSIS — I509 Heart failure, unspecified: Secondary | ICD-10-CM

## 2012-11-07 DIAGNOSIS — Z86718 Personal history of other venous thrombosis and embolism: Secondary | ICD-10-CM

## 2012-11-07 DIAGNOSIS — I2510 Atherosclerotic heart disease of native coronary artery without angina pectoris: Secondary | ICD-10-CM

## 2012-11-07 MED ORDER — MOMETASONE FUROATE 50 MCG/ACT NA SUSP: 1.0000 | Freq: Every day | NASAL | Status: DC

## 2012-11-07 MED ORDER — LANOXIN 250 MCG PO TABS: 0.1250 mg | ORAL_TABLET | Freq: Every day | ORAL | Status: DC

## 2012-11-07 NOTE — Assessment & Plan Note (Signed)
Well controlled 

## 2012-11-07 NOTE — Patient Instructions (Addendum)
2.5 mg (1/2 pill) on sundays and Wednesday. Then 5 mg (1 pill) on other days,  Check in 6 weeks    Latest dosing instructions   Total Sun Mon Tue Wed Thu Fri Sat   30 2.5 mg 5 mg 5 mg 2.5 mg 5 mg 5 mg 5 mg    (5 mg0.5) (5 mg1) (5 mg1) (5 mg0.5) (5 mg1) (5 mg1) (5 mg1)

## 2012-11-09 NOTE — Assessment & Plan Note (Signed)
Pt has done well Continue current meds He has f/u with [facility] this month

## 2012-11-09 NOTE — Assessment & Plan Note (Signed)
Not on any meds He needs to continue to exercise Will check labs today

## 2012-11-09 NOTE — Assessment & Plan Note (Signed)
Pt without sxs Continue risk factor modification

## 2012-11-09 NOTE — Progress Notes (Signed)
Patient ID: Carl Wood, male   DOB: [birth year], 71 y.o.   MRN: [id number] Cad-no sxs of cp, sob, pnd  ICM-- no sxs, he is on warfarin for ICM and hx of dvt  Lipids- tolerating meds  Past Medical History  Diagnosis Date  . PE (pulmonary embolism)   . Colon polyps   . CHF (congestive heart failure)   . CAD (coronary artery disease)   . Diabetes mellitus   . Diverticulosis of colon   . Hyperlipidemia   . Hypertension   . MI (mitral incompetence)   . Nephrolithiasis   . V-tach     History   Social History  . Marital Status: Married    Spouse Name: N/A    Number of Children: 3  . Years of Education: N/A   Occupational History  . Retired    Social History Main Topics  . Smoking status: Current Every Day Smoker -- 0.5 packs/day    Types: Cigarettes  . Smokeless tobacco: Never Used  . Alcohol Use: No  . Drug Use: No  . Sexually Active: Not on file   Other Topics Concern  . Not on file   Social History Narrative  . No narrative on file    Past Surgical History  Procedure Date  . Back surgery   . Cardiac defibrillator placement     medtronic virtuoso  . Basal cell carcinoma excision     nose  . Knee arthroplasty     Family History  Problem Relation Age of Onset  . Colon cancer Father   . Diabetes Father   . Heart disease Father   . Bladder Cancer Mother   . Heart disease Mother     Allergies  Allergen Reactions  . Penicillins     REACTION: unspecified  . Sulfamethoxazole     REACTION: unspecified    Current Outpatient Prescriptions on File Prior to Visit  Medication Sig Dispense Refill  . atorvastatin (LIPITOR) 80 MG tablet TAKE ONE TABLET BY MOUTH EVERY DAY AS DIRECTED  30 tablet  5  . carvedilol (COREG) 6.25 MG tablet TAKE ONE TABLET BY MOUTH TWICE DAILY  180 tablet  3  . enalapril (VASOTEC) 10 MG tablet Take 1 tablet (10 mg total) by mouth 2 (two) times daily.  60 tablet  10  . fish oil-omega-3 fatty acids 1000 MG capsule Take 2 g by mouth  daily.        . Na Sulfate-K Sulfate-Mg Sulf (SUPREP BOWEL PREP) SOLN Take 1 kit by mouth once.  1 Bottle  0  . warfarin (COUMADIN) 5 MG tablet 5 mg daily except 2.5 mg on Sunday and Wednesday      . LANOXIN 0.25 MG tablet Take 0.5 tablets (0.125 mg total) by mouth daily.  90 each  0  . mometasone (NASONEX) 50 MCG/ACT nasal spray Place 1 spray into the nose daily.  17 g  12     patient denies chest pain, shortness of breath, orthopnea. Denies lower extremity edema, abdominal pain, change in appetite, change in bowel movements. Patient denies rashes, musculoskeletal complaints. No other specific complaints in a complete review of systems.   BP 114/62  Pulse 68  Temp 98 F (36.7 C) (Oral)  Ht 6' (1.829 m)  Wt 198 lb (89.812 kg)  BMI 26.85 kg/m2  well-developed well-nourished male in no acute distress. HEENT exam atraumatic, normocephalic, neck supple without jugular venous distention. Chest clear to auscultation cardiac exam S1-S2 are regular. Abdominal exam overweight  with bowel sounds, soft and nontender. Extremities no edema. Neurologic exam is alert with a normal gait.

## 2012-11-15 ENCOUNTER — Telehealth: Payer: Self-pay

## 2012-11-15 ENCOUNTER — Other Ambulatory Visit: Payer: Self-pay | Admitting: Gastroenterology

## 2012-11-15 DIAGNOSIS — Z1211 Encounter for screening for malignant neoplasm of colon: Secondary | ICD-10-CM

## 2012-11-15 NOTE — Telephone Encounter (Signed)
Pt aware. Pt scheduled at [facility] 12/11/12 pt to arrive at 11:30am for a 12:30pm colon. New prep instructions mailed to pt.

## 2012-11-15 NOTE — Telephone Encounter (Signed)
Message copied by Chrystie Nose on Wed Nov 15, 2012  9:10 AM ------      Message from: Melvia Heaps D      Created: Wed Nov 15, 2012  8:49 AM      Regarding: FW: ASA IV                   ----- Message -----         From: Cathlyn Parsons, CRNA         Sent: 11/14/2012   6:29 PM           To: Clarnce Flock, Louis Meckel, MD, #      Subject: ASA IV                                                   Dr. Arlyce Dice,            This gentleman has been scheduled for a screening colonoscopy with you on Dec 17.  He has an AICD and an EF of 28%, as calculated by [name] in his note from 02/17/12. Unfortunately his poor cardiac status necessitates his procedure being done in the hospital setting.            Best regards,            Cathlyn Parsons

## 2012-11-20 ENCOUNTER — Other Ambulatory Visit: Payer: Self-pay | Admitting: Internal Medicine

## 2012-11-21 ENCOUNTER — Encounter: Payer: Medicare Other | Admitting: Gastroenterology

## 2012-11-30 ENCOUNTER — Encounter: Payer: Self-pay | Admitting: Internal Medicine

## 2012-11-30 ENCOUNTER — Ambulatory Visit (INDEPENDENT_AMBULATORY_CARE_PROVIDER_SITE_OTHER): Payer: Medicare Other | Admitting: *Deleted

## 2012-11-30 DIAGNOSIS — I472 Ventricular tachycardia: Secondary | ICD-10-CM

## 2012-11-30 DIAGNOSIS — I2589 Other forms of chronic ischemic heart disease: Secondary | ICD-10-CM

## 2012-11-30 DIAGNOSIS — I509 Heart failure, unspecified: Secondary | ICD-10-CM

## 2012-11-30 DIAGNOSIS — I4729 Other ventricular tachycardia: Secondary | ICD-10-CM

## 2012-11-30 LAB — ICD DEVICE OBSERVATION
BATTERY VOLTAGE: 3.07 V
BRDY-0002RV: 40 {beats}/min
FVT: 0
PACEART VT: 0
TZAT-0001SLOWVT: 1
TZAT-0001SLOWVT: 2
TZAT-0002FASTVT: NEGATIVE
TZAT-0011SLOWVT: 10 ms
TZAT-0011SLOWVT: 10 ms
TZAT-0012FASTVT: 200 ms
TZAT-0018SLOWVT: NEGATIVE
TZAT-0018SLOWVT: NEGATIVE
TZAT-0019FASTVT: 8 V
TZAT-0019SLOWVT: 8 V
TZAT-0019SLOWVT: 8 V
TZAT-0020FASTVT: 1.5 ms
TZAT-0020SLOWVT: 1.5 ms
TZAT-0020SLOWVT: 1.5 ms
TZON-0003VSLOWVT: 430 ms
TZON-0005SLOWVT: 20
TZST-0001FASTVT: 5
TZST-0001SLOWVT: 3
TZST-0001SLOWVT: 4
TZST-0001SLOWVT: 5
TZST-0002FASTVT: NEGATIVE
TZST-0002FASTVT: NEGATIVE
TZST-0002FASTVT: NEGATIVE
TZST-0003SLOWVT: 25 J
TZST-0003SLOWVT: 35 J
VENTRICULAR PACING ICD: 1.31 pct
VF: 0

## 2012-11-30 NOTE — Patient Instructions (Addendum)
Return office visit 12/20/12 @9 :30am with the device clinic and 02/28/13 @ 9:15am with [name]

## 2012-11-30 NOTE — Progress Notes (Signed)
ICD check with ICM 

## 2012-12-08 ENCOUNTER — Encounter (HOSPITAL_COMMUNITY): Payer: Self-pay | Admitting: *Deleted

## 2012-12-11 ENCOUNTER — Encounter (HOSPITAL_COMMUNITY)
Admission: RE | Disposition: A | Discharge status: Home / Self Care | Payer: Self-pay | Source: Ambulatory Visit | Attending: Gastroenterology

## 2012-12-11 ENCOUNTER — Encounter (HOSPITAL_COMMUNITY): Payer: Self-pay | Admitting: *Deleted

## 2012-12-11 ENCOUNTER — Ambulatory Visit (HOSPITAL_COMMUNITY)
Admission: RE | Admit: 2012-12-11 | Discharge: 2012-12-11 | Disposition: A | Discharge status: Home / Self Care | Payer: Medicare Other | Source: Ambulatory Visit | Attending: Gastroenterology | Admitting: Gastroenterology

## 2012-12-11 DIAGNOSIS — I2510 Atherosclerotic heart disease of native coronary artery without angina pectoris: Secondary | ICD-10-CM | POA: Insufficient documentation

## 2012-12-11 DIAGNOSIS — Z79899 Other long term (current) drug therapy: Secondary | ICD-10-CM | POA: Insufficient documentation

## 2012-12-11 DIAGNOSIS — F17200 Nicotine dependence, unspecified, uncomplicated: Secondary | ICD-10-CM | POA: Insufficient documentation

## 2012-12-11 DIAGNOSIS — E119 Type 2 diabetes mellitus without complications: Secondary | ICD-10-CM | POA: Insufficient documentation

## 2012-12-11 DIAGNOSIS — Z800 Family history of malignant neoplasm of digestive organs: Secondary | ICD-10-CM | POA: Insufficient documentation

## 2012-12-11 DIAGNOSIS — Z7901 Long term (current) use of anticoagulants: Secondary | ICD-10-CM | POA: Insufficient documentation

## 2012-12-11 DIAGNOSIS — Z1211 Encounter for screening for malignant neoplasm of colon: Secondary | ICD-10-CM

## 2012-12-11 DIAGNOSIS — D126 Benign neoplasm of colon, unspecified: Secondary | ICD-10-CM

## 2012-12-11 DIAGNOSIS — E785 Hyperlipidemia, unspecified: Secondary | ICD-10-CM | POA: Insufficient documentation

## 2012-12-11 DIAGNOSIS — Z86718 Personal history of other venous thrombosis and embolism: Secondary | ICD-10-CM | POA: Insufficient documentation

## 2012-12-11 DIAGNOSIS — I10 Essential (primary) hypertension: Secondary | ICD-10-CM | POA: Insufficient documentation

## 2012-12-11 HISTORY — PX: COLONOSCOPY: SHX5424

## 2012-12-11 SURGERY — COLONOSCOPY
Anesthesia: Moderate Sedation

## 2012-12-11 MED ORDER — MIDAZOLAM HCL 5 MG/5ML IJ SOLN
INTRAMUSCULAR | Status: DC | PRN
  Administered 2012-12-11: 2 mg via INTRAVENOUS
  Administered 2012-12-11: 1 mg via INTRAVENOUS
  Administered 2012-12-11 (×2): 2 mg via INTRAVENOUS
  Administered 2012-12-11: 1 mg via INTRAVENOUS
  Administered 2012-12-11: 2 mg via INTRAVENOUS

## 2012-12-11 MED ORDER — SODIUM CHLORIDE 0.9 % IV SOLN: INTRAVENOUS | Status: DC

## 2012-12-11 MED ORDER — MIDAZOLAM HCL 10 MG/2ML IJ SOLN
INTRAMUSCULAR | Status: AC
  Filled 2012-12-11: qty 4

## 2012-12-11 MED ORDER — DIPHENHYDRAMINE HCL 50 MG/ML IJ SOLN
INTRAMUSCULAR | Status: AC
  Filled 2012-12-11: qty 1

## 2012-12-11 MED ORDER — FENTANYL CITRATE 0.05 MG/ML IJ SOLN
INTRAMUSCULAR | Status: AC
  Filled 2012-12-11: qty 4

## 2012-12-11 MED ORDER — FENTANYL CITRATE 0.05 MG/ML IJ SOLN
INTRAMUSCULAR | Status: DC | PRN
  Administered 2012-12-11 (×6): 25 ug via INTRAVENOUS

## 2012-12-11 NOTE — Op Note (Signed)
[facility] [address] [place], [address]   COLONOSCOPY PROCEDURE REPORT  PATIENT: Carl, Wood  MR#: [id number] BIRTHDATE: [birth year] , 71  yrs. old GENDER: Male ENDOSCOPIST: Louis Meckel, MD REFERRED AO:ZHYQM Swords, M.D. PROCEDURE DATE:  12/11/2012 PROCEDURE:   Colonoscopy with snare polypectomy and Colonoscopy with cold biopsy polypectomy ASA CLASS:   Class II INDICATIONS:Patient's immediate family history of colon cancer. MEDICATIONS: These medications were titrated to patient response per physician's verbal order, Versed 10 mg IV, and Fentanyl 150 mcg IV   DESCRIPTION OF PROCEDURE:   After the risks benefits and alternatives of the procedure were thoroughly explained, informed consent was obtained.  A digital rectal exam revealed no abnormalities of the rectum.   The Pentax Colonoscope [id number] endoscope was introduced through the anus and advanced to the cecum, which was identified by both the appendix and ileocecal valve. No adverse events experienced.   The quality of the prep was Suprep excellent  The instrument was then slowly withdrawn as the colon was fully examined.      COLON FINDINGS: There were 3 sessile polyps in the descending colon measuring approximately 3 mm and were removed with cold polypectomy snare.  A single 2 mm polyp was seen the distal transverse colon and was removed with cold biopsy sepsis.  Specimens were submitted to pathology.  Scattered diverticula in the descending and transverse colon.    The colon mucosa was otherwise normal. Retroflexed views revealed no abnormalities. The time to cecum=  . Withdrawal time=17 minutes 0 seconds.  The scope was withdrawn and the procedure completed. COMPLICATIONS: There were no complications.  ENDOSCOPIC IMPRESSION: 1.   colon polyp 2.  Diverticulosis  RECOMMENDATIONS: 1.  f/u colonoscopy 5 years   (family history)  eSigned:  Louis Meckel, MD 12/11/2012 1:20  PM   cc:

## 2012-12-11 NOTE — H&P (Signed)
Carl Heaps, MD 10/17/2012 3:37 PM Signed  History of Present Illness: Pleasant 72 year old white male referred at the request of Dr. Cato Mulligan for screening colonoscopy. Family history is pertinent for father who had colon cancer in his 49s. The patient's last colonoscopy in 2006 demonstrated diverticulosis and hemorrhoids. He is on Coumadin. He has no GI complaints including change of bowel habits, melena or hematochezia.  Past Medical History   Diagnosis  Date   .  PE (pulmonary embolism)    .  Colon polyps    .  CHF (congestive heart failure)    .  CAD (coronary artery disease)    .  Diabetes mellitus    .  Diverticulosis of colon    .  Hyperlipidemia    .  Hypertension    .  MI (mitral incompetence)    .  Nephrolithiasis    .  V-tach     Past Surgical History   Procedure  Date   .  Back surgery    .  Cardiac defibrillator placement      medtronic virtuoso   .  Basal cell carcinoma excision      nose   .  Knee arthroplasty     family history includes Bladder Cancer in his mother; Colon cancer in his father; Diabetes in his father; and Heart disease in his father and mother.  Current Outpatient Prescriptions   Medication  Sig  Dispense  Refill   .  albuterol (PROVENTIL HFA;VENTOLIN HFA) 108 (90 BASE) MCG/ACT inhaler  Inhale 2 puffs into the lungs every 6 (six) hours as needed for wheezing.  1 Inhaler  0   .  albuterol (PROVENTIL,VENTOLIN) 90 MCG/ACT inhaler  Inhale 2 puffs into the lungs every 6 (six) hours as needed.     Marland Kitchen  atorvastatin (LIPITOR) 80 MG tablet  TAKE ONE TABLET BY MOUTH EVERY DAY AS DIRECTED  30 tablet  5   .  carvedilol (COREG) 6.25 MG tablet  TAKE ONE TABLET BY MOUTH TWICE DAILY  180 tablet  3   .  enalapril (VASOTEC) 10 MG tablet  Take 1 tablet (10 mg total) by mouth 2 (two) times daily.  60 tablet  10   .  fish oil-omega-3 fatty acids 1000 MG capsule  Take 2 g by mouth daily.     .  fluticasone (FLONASE) 50 MCG/ACT nasal spray  USE TWO SPRAY IN EACH NOSTRIL  EVERY DAY  16 g  0   .  LANOXIN 0.25 MG tablet  TAKE ONE-HALF TABLET BY MOUTH EVERY DAY  15 each  3   .  warfarin (COUMADIN) 5 MG tablet  5 mg daily except 2.5 mg on Sunday and Wednesday     .  [DISCONTINUED] warfarin (COUMADIN) 5 MG tablet  TAKE AS DIRECTED  30 tablet  11    Allergies as of 10/16/2012 - Review Complete 10/16/2012   Allergen  Reaction  Noted   .  Penicillins   08/20/2005   .  Sulfamethoxazole   08/20/2005    reports that he has been smoking Cigarettes. He has been smoking about .5 packs per day. He has never used smokeless tobacco. He reports that he does not drink alcohol or use illicit drugs.  Review of Systems: He is had a recent upper respiratory infection for which he took antibiotics Pertinent positive and negative review of systems were noted in the above HPI section. All other review of systems were otherwise negative.  Vital signs were  reviewed in today's medical record  Physical Exam:  General: Well developed , well nourished, no acute distress  Head: Normocephalic and atraumatic  Eyes: sclerae anicteric, EOMI  Ears: Normal auditory acuity  Mouth: No deformity or lesions  Neck: Supple, no masses or thyromegaly  Lungs: There are mild, diffuse expiratory wheezes  Heart: Regular rate and rhythm; no murmurs, rubs or bruits  Abdomen: Soft, non tender and non distended. No masses, hepatosplenomegaly or hernias noted. Normal Bowel sounds  Rectal:deferred  Musculoskeletal: Symmetrical with no gross deformities  Skin: No lesions on visible extremities  Pulses: Normal pulses noted  Extremities: No clubbing, cyanosis, edema or deformities noted  Neurological: Alert oriented x 4, grossly nonfocal  Cervical Nodes: No significant cervical adenopathy  Inguinal Nodes: No significant inguinal adenopathy  Psychological: Alert and cooperative. Normal mood and affect   Family history of malignant neoplasm of gastrointestinal tract - Carl Heaps, MD 10/16/2012 9:01 AM Signed    Patient will be scheduled for screening colonoscopy. Coumadin will be held in advance of the procedure

## 2012-12-12 ENCOUNTER — Encounter: Payer: Self-pay | Admitting: Gastroenterology

## 2012-12-12 ENCOUNTER — Encounter (HOSPITAL_COMMUNITY): Payer: Self-pay | Admitting: Gastroenterology

## 2012-12-19 ENCOUNTER — Encounter (HOSPITAL_COMMUNITY): Payer: Self-pay | Admitting: Anesthesiology

## 2012-12-19 ENCOUNTER — Inpatient Hospital Stay (HOSPITAL_COMMUNITY): Payer: Medicare Other

## 2012-12-19 ENCOUNTER — Encounter: Payer: Medicare Other | Admitting: Family

## 2012-12-19 ENCOUNTER — Inpatient Hospital Stay (HOSPITAL_COMMUNITY)
Admission: EM | Admit: 2012-12-19 | Discharge: 2012-12-25 | DRG: 023 | Disposition: A | Discharge status: Home / Self Care | Payer: Medicare Other | Attending: Neurology | Admitting: Neurology

## 2012-12-19 ENCOUNTER — Inpatient Hospital Stay (HOSPITAL_COMMUNITY): Payer: Medicare Other | Admitting: Anesthesiology

## 2012-12-19 ENCOUNTER — Encounter (HOSPITAL_COMMUNITY): Payer: Self-pay | Admitting: Physical Medicine and Rehabilitation

## 2012-12-19 ENCOUNTER — Emergency Department (HOSPITAL_COMMUNITY): Payer: Medicare Other

## 2012-12-19 DIAGNOSIS — I63239 Cerebral infarction due to unspecified occlusion or stenosis of unspecified carotid arteries: Principal | ICD-10-CM | POA: Diagnosis present

## 2012-12-19 DIAGNOSIS — I4891 Unspecified atrial fibrillation: Secondary | ICD-10-CM

## 2012-12-19 DIAGNOSIS — I152 Hypertension secondary to endocrine disorders: Secondary | ICD-10-CM | POA: Diagnosis present

## 2012-12-19 DIAGNOSIS — J95821 Acute postprocedural respiratory failure: Secondary | ICD-10-CM | POA: Diagnosis not present

## 2012-12-19 DIAGNOSIS — Y9210 Unspecified residential institution as the place of occurrence of the external cause: Secondary | ICD-10-CM | POA: Diagnosis not present

## 2012-12-19 DIAGNOSIS — R4701 Aphasia: Secondary | ICD-10-CM | POA: Diagnosis present

## 2012-12-19 DIAGNOSIS — D126 Benign neoplasm of colon, unspecified: Secondary | ICD-10-CM

## 2012-12-19 DIAGNOSIS — I472 Ventricular tachycardia, unspecified: Secondary | ICD-10-CM

## 2012-12-19 DIAGNOSIS — G459 Transient cerebral ischemic attack, unspecified: Secondary | ICD-10-CM

## 2012-12-19 DIAGNOSIS — I4729 Other ventricular tachycardia: Secondary | ICD-10-CM

## 2012-12-19 DIAGNOSIS — I999 Unspecified disorder of circulatory system: Secondary | ICD-10-CM | POA: Diagnosis not present

## 2012-12-19 DIAGNOSIS — Y834 Other reconstructive surgery as the cause of abnormal reaction of the patient, or of later complication, without mention of misadventure at the time of the procedure: Secondary | ICD-10-CM | POA: Diagnosis not present

## 2012-12-19 DIAGNOSIS — Z95810 Presence of automatic (implantable) cardiac defibrillator: Secondary | ICD-10-CM

## 2012-12-19 DIAGNOSIS — E785 Hyperlipidemia, unspecified: Secondary | ICD-10-CM | POA: Diagnosis present

## 2012-12-19 DIAGNOSIS — I724 Aneurysm of artery of lower extremity: Secondary | ICD-10-CM | POA: Diagnosis not present

## 2012-12-19 DIAGNOSIS — E1169 Type 2 diabetes mellitus with other specified complication: Secondary | ICD-10-CM | POA: Diagnosis present

## 2012-12-19 DIAGNOSIS — I639 Cerebral infarction, unspecified: Secondary | ICD-10-CM

## 2012-12-19 DIAGNOSIS — J9600 Acute respiratory failure, unspecified whether with hypoxia or hypercapnia: Secondary | ICD-10-CM | POA: Diagnosis not present

## 2012-12-19 DIAGNOSIS — E119 Type 2 diabetes mellitus without complications: Secondary | ICD-10-CM

## 2012-12-19 DIAGNOSIS — R319 Hematuria, unspecified: Secondary | ICD-10-CM | POA: Diagnosis not present

## 2012-12-19 DIAGNOSIS — S3012XA Contusion of groin, initial encounter: Secondary | ICD-10-CM

## 2012-12-19 DIAGNOSIS — I714 Abdominal aortic aneurysm, without rupture, unspecified: Secondary | ICD-10-CM | POA: Diagnosis present

## 2012-12-19 DIAGNOSIS — Z882 Allergy status to sulfonamides status: Secondary | ICD-10-CM

## 2012-12-19 DIAGNOSIS — I82409 Acute embolism and thrombosis of unspecified deep veins of unspecified lower extremity: Secondary | ICD-10-CM | POA: Diagnosis present

## 2012-12-19 DIAGNOSIS — E1159 Type 2 diabetes mellitus with other circulatory complications: Secondary | ICD-10-CM | POA: Diagnosis present

## 2012-12-19 DIAGNOSIS — I509 Heart failure, unspecified: Secondary | ICD-10-CM

## 2012-12-19 DIAGNOSIS — N200 Calculus of kidney: Secondary | ICD-10-CM

## 2012-12-19 DIAGNOSIS — S301XXA Contusion of abdominal wall, initial encounter: Secondary | ICD-10-CM

## 2012-12-19 DIAGNOSIS — I2589 Other forms of chronic ischemic heart disease: Secondary | ICD-10-CM | POA: Diagnosis present

## 2012-12-19 DIAGNOSIS — I10 Essential (primary) hypertension: Secondary | ICD-10-CM

## 2012-12-19 DIAGNOSIS — F17200 Nicotine dependence, unspecified, uncomplicated: Secondary | ICD-10-CM | POA: Diagnosis present

## 2012-12-19 DIAGNOSIS — Z8673 Personal history of transient ischemic attack (TIA), and cerebral infarction without residual deficits: Secondary | ICD-10-CM | POA: Diagnosis present

## 2012-12-19 DIAGNOSIS — D6832 Hemorrhagic disorder due to extrinsic circulating anticoagulants: Secondary | ICD-10-CM | POA: Diagnosis present

## 2012-12-19 DIAGNOSIS — G8190 Hemiplegia, unspecified affecting unspecified side: Secondary | ICD-10-CM | POA: Diagnosis present

## 2012-12-19 DIAGNOSIS — I2782 Chronic pulmonary embolism: Secondary | ICD-10-CM

## 2012-12-19 DIAGNOSIS — T45515A Adverse effect of anticoagulants, initial encounter: Secondary | ICD-10-CM | POA: Diagnosis present

## 2012-12-19 DIAGNOSIS — Z86711 Personal history of pulmonary embolism: Secondary | ICD-10-CM

## 2012-12-19 DIAGNOSIS — Z96659 Presence of unspecified artificial knee joint: Secondary | ICD-10-CM

## 2012-12-19 DIAGNOSIS — G9340 Encephalopathy, unspecified: Secondary | ICD-10-CM | POA: Diagnosis not present

## 2012-12-19 DIAGNOSIS — I2510 Atherosclerotic heart disease of native coronary artery without angina pectoris: Secondary | ICD-10-CM | POA: Diagnosis present

## 2012-12-19 DIAGNOSIS — Z880 Allergy status to penicillin: Secondary | ICD-10-CM

## 2012-12-19 DIAGNOSIS — Z8249 Family history of ischemic heart disease and other diseases of the circulatory system: Secondary | ICD-10-CM

## 2012-12-19 DIAGNOSIS — I5022 Chronic systolic (congestive) heart failure: Secondary | ICD-10-CM | POA: Diagnosis present

## 2012-12-19 LAB — BLOOD GAS, ARTERIAL
Acid-base deficit: 0.6 mmol/L (ref 0.0–2.0)
Bicarbonate: 24.1 mEq/L — ABNORMAL HIGH (ref 20.0–24.0)
FIO2: 100 %
O2 Saturation: 99.4 %
PEEP: 5 cmH2O
Patient temperature: 97.6

## 2012-12-19 LAB — CBC
Hemoglobin: 15.2 g/dL (ref 13.0–17.0)
MCH: 33.3 pg (ref 26.0–34.0)
MCHC: 34.2 g/dL (ref 30.0–36.0)
MCV: 97.4 fL (ref 78.0–100.0)
Platelets: 181 10*3/uL (ref 150–400)
RBC: 4.57 MIL/uL (ref 4.22–5.81)

## 2012-12-19 LAB — POCT I-STAT, CHEM 8
Calcium, Ion: 1.15 mmol/L (ref 1.13–1.30)
Calcium, Ion: 1.17 mmol/L (ref 1.13–1.30)
Chloride: 103 mEq/L (ref 96–112)
Creatinine, Ser: 0.9 mg/dL (ref 0.50–1.35)
HCT: 46 % (ref 39.0–52.0)
Hemoglobin: 16.3 g/dL (ref 13.0–17.0)
Potassium: 4.2 mEq/L (ref 3.5–5.1)
Sodium: 141 mEq/L (ref 135–145)
TCO2: 28 mmol/L (ref 0–100)

## 2012-12-19 LAB — COMPREHENSIVE METABOLIC PANEL
Albumin: 3.7 g/dL (ref 3.5–5.2)
BUN: 13 mg/dL (ref 6–23)
Calcium: 9.3 mg/dL (ref 8.4–10.5)
GFR calc Af Amer: >90 mL/min (ref >90)
Glucose, Bld: 123 mg/dL — ABNORMAL HIGH (ref 70–99)
Total Protein: 7.1 g/dL (ref 6.0–8.3)

## 2012-12-19 LAB — DIFFERENTIAL
Basophils Relative: 1 % (ref 0–1)
Eosinophils Absolute: 0.3 10*3/uL (ref 0.0–0.7)
Eosinophils Relative: 4 % (ref 0–5)
Lymphs Abs: 1.5 10*3/uL (ref 0.7–4.0)
Monocytes Relative: 11 % (ref 3–12)
Neutrophils Relative %: 65 % (ref 43–77)

## 2012-12-19 LAB — TROPONIN I: Troponin I: <0.3 ng/mL (ref <0.30)

## 2012-12-19 LAB — PROTIME-INR
INR: 1.49 (ref 0.00–1.49)
Prothrombin Time: 17.6 seconds — ABNORMAL HIGH (ref 11.6–15.2)

## 2012-12-19 LAB — POCT I-STAT TROPONIN I: Troponin i, poc: 0 ng/mL (ref 0.00–0.08)

## 2012-12-19 LAB — MRSA PCR SCREENING: MRSA by PCR: NEGATIVE

## 2012-12-19 MED ORDER — FENTANYL CITRATE 0.05 MG/ML IJ SOLN
INTRAMUSCULAR | Status: DC | PRN
  Administered 2012-12-19: 250 ug via INTRAVENOUS

## 2012-12-19 MED ORDER — PHENYLEPHRINE HCL 10 MG/ML IJ SOLN
INTRAMUSCULAR | Status: DC | PRN
  Administered 2012-12-19: 80 ug via INTRAVENOUS

## 2012-12-19 MED ORDER — ONDANSETRON HCL 4 MG/2ML IJ SOLN: 4.0000 mg | Freq: Four times a day (QID) | INTRAMUSCULAR | Status: DC | PRN

## 2012-12-19 MED ORDER — LIDOCAINE HCL (CARDIAC) 20 MG/ML IV SOLN
INTRAVENOUS | Status: DC | PRN
  Administered 2012-12-19: 50 mg via INTRAVENOUS

## 2012-12-19 MED ORDER — FENTANYL CITRATE 0.05 MG/ML IJ SOLN
INTRAMUSCULAR | Status: DC | PRN
  Administered 2012-12-19: 12.5 ug via INTRAVENOUS

## 2012-12-19 MED ORDER — ESMOLOL HCL 10 MG/ML IV SOLN
INTRAVENOUS | Status: DC | PRN
  Administered 2012-12-19: 20 mg via INTRAVENOUS

## 2012-12-19 MED ORDER — ALBUTEROL SULFATE HFA 108 (90 BASE) MCG/ACT IN AERS
4.0000 | INHALATION_SPRAY | RESPIRATORY_TRACT | Status: DC | PRN
  Filled 2012-12-19: qty 6.7

## 2012-12-19 MED ORDER — PROPOFOL 10 MG/ML IV EMUL
5.0000 ug/kg/min | INTRAVENOUS | Status: DC
  Filled 2012-12-19: qty 100

## 2012-12-19 MED ORDER — ALTEPLASE 30 MG/30 ML FOR INTERV. RAD
1.0000 mg | INTRA_ARTERIAL | Status: AC | PRN
  Filled 2012-12-19: qty 30

## 2012-12-19 MED ORDER — ACETAMINOPHEN 650 MG RE SUPP: 650.0000 mg | Freq: Four times a day (QID) | RECTAL | Status: DC | PRN

## 2012-12-19 MED ORDER — MIDAZOLAM HCL 2 MG/2ML IJ SOLN: 2.0000 mg | INTRAMUSCULAR | Status: DC | PRN

## 2012-12-19 MED ORDER — ASPIRIN 81 MG PO CHEW
CHEWABLE_TABLET | ORAL | Status: AC
  Filled 2012-12-19: qty 3

## 2012-12-19 MED ORDER — PANTOPRAZOLE SODIUM 40 MG IV SOLR: 40.0000 mg | Freq: Every day | INTRAVENOUS | Status: DC

## 2012-12-19 MED ORDER — VANCOMYCIN HCL IN DEXTROSE 1-5 GM/200ML-% IV SOLN
1000.0000 mg | Freq: Once | INTRAVENOUS | Status: AC
  Administered 2012-12-19: 1000 mg via INTRAVENOUS
  Filled 2012-12-19: qty 200

## 2012-12-19 MED ORDER — PROPOFOL 10 MG/ML IV BOLUS
INTRAVENOUS | Status: DC | PRN
  Administered 2012-12-19: 30 mg via INTRAVENOUS

## 2012-12-19 MED ORDER — IOHEXOL 300 MG/ML  SOLN
150.0000 mL | Freq: Once | INTRAMUSCULAR | Status: AC | PRN
  Administered 2012-12-19: 10 mL via INTRA_ARTERIAL

## 2012-12-19 MED ORDER — BIOTENE DRY MOUTH MT LIQD
15.0000 mL | Freq: Four times a day (QID) | OROMUCOSAL | Status: DC
  Administered 2012-12-20 – 2012-12-21 (×5): 15 mL via OROMUCOSAL

## 2012-12-19 MED ORDER — ROCURONIUM BROMIDE 100 MG/10ML IV SOLN
INTRAVENOUS | Status: DC | PRN
  Administered 2012-12-19: 50 mg via INTRAVENOUS

## 2012-12-19 MED ORDER — FENTANYL CITRATE 0.05 MG/ML IJ SOLN
INTRAMUSCULAR | Status: AC
  Filled 2012-12-19: qty 2

## 2012-12-19 MED ORDER — MIDAZOLAM HCL 2 MG/2ML IJ SOLN
INTRAMUSCULAR | Status: AC
  Filled 2012-12-19: qty 2

## 2012-12-19 MED ORDER — PROPOFOL 10 MG/ML IV EMUL
5.0000 ug/kg/min | INTRAVENOUS | Status: DC
  Administered 2012-12-19: 25 ug/kg/min via INTRAVENOUS
  Administered 2012-12-19 – 2012-12-20 (×3): 50 ug/kg/min via INTRAVENOUS
  Filled 2012-12-19 (×3): qty 100

## 2012-12-19 MED ORDER — ETOMIDATE 2 MG/ML IV SOLN
INTRAVENOUS | Status: DC | PRN
  Administered 2012-12-19: 20 mg via INTRAVENOUS

## 2012-12-19 MED ORDER — LABETALOL HCL 5 MG/ML IV SOLN: 10.0000 mg | INTRAVENOUS | Status: DC | PRN

## 2012-12-19 MED ORDER — VECURONIUM BROMIDE 10 MG IV SOLR
INTRAVENOUS | Status: DC | PRN
  Administered 2012-12-19: 2 mg via INTRAVENOUS

## 2012-12-19 MED ORDER — SUCCINYLCHOLINE CHLORIDE 20 MG/ML IJ SOLN
INTRAMUSCULAR | Status: DC | PRN
  Administered 2012-12-19: 100 mg via INTRAVENOUS

## 2012-12-19 MED ORDER — SODIUM CHLORIDE 0.9 % IJ SOLN
1.5000 mg | INTRAVENOUS | Status: AC
  Filled 2012-12-19 (×2): qty 0.3

## 2012-12-19 MED ORDER — MIDAZOLAM HCL 2 MG/2ML IJ SOLN
INTRAMUSCULAR | Status: DC | PRN
  Administered 2012-12-19: 0.5 mg via INTRAVENOUS

## 2012-12-19 MED ORDER — VANCOMYCIN HCL 1000 MG IV SOLR: 1000.0000 mg | Freq: Once | INTRAVENOUS | Status: DC

## 2012-12-19 MED ORDER — ASPIRIN 325 MG PO TABS
ORAL_TABLET | ORAL | Status: AC
  Filled 2012-12-19: qty 2

## 2012-12-19 MED ORDER — ACETAMINOPHEN 650 MG RE SUPP: 650.0000 mg | RECTAL | Status: DC | PRN

## 2012-12-19 MED ORDER — IOHEXOL 300 MG/ML  SOLN
150.0000 mL | Freq: Once | INTRAMUSCULAR | Status: AC | PRN
  Administered 2012-12-19: 130 mL via INTRA_ARTERIAL

## 2012-12-19 MED ORDER — SODIUM CHLORIDE 0.9 % IV SOLN: INTRAVENOUS | Status: DC

## 2012-12-19 MED ORDER — PHENYLEPHRINE HCL 10 MG/ML IJ SOLN
10.0000 mg | INTRAVENOUS | Status: DC | PRN
  Administered 2012-12-19: 50 ug/min via INTRAVENOUS

## 2012-12-19 MED ORDER — CLOPIDOGREL BISULFATE 75 MG PO TABS
ORAL_TABLET | ORAL | Status: AC
  Filled 2012-12-19: qty 2

## 2012-12-19 MED ORDER — ARTIFICIAL TEARS OP OINT
TOPICAL_OINTMENT | OPHTHALMIC | Status: DC | PRN
  Administered 2012-12-19: 1 via OPHTHALMIC

## 2012-12-19 MED ORDER — IOHEXOL 350 MG/ML SOLN: 80.0000 mL | Freq: Once | INTRAVENOUS | Status: AC | PRN

## 2012-12-19 MED ORDER — ACETAMINOPHEN 500 MG PO TABS: 1000.0000 mg | ORAL_TABLET | Freq: Four times a day (QID) | ORAL | Status: DC | PRN

## 2012-12-19 MED ORDER — NICARDIPINE HCL IN NACL 20-0.86 MG/200ML-% IV SOLN
5.0000 mg/h | INTRAVENOUS | Status: DC
  Filled 2012-12-19 (×2): qty 200

## 2012-12-19 MED ORDER — SODIUM CHLORIDE 0.9 % IV SOLN
INTRAVENOUS | Status: AC
  Administered 2012-12-19: 1000 mL via INTRAVENOUS
  Administered 2012-12-20: 13:00:00 via INTRAVENOUS

## 2012-12-19 MED ORDER — SODIUM CHLORIDE 0.9 % IV SOLN
INTRAVENOUS | Status: DC | PRN
  Administered 2012-12-19: 13:00:00 via INTRAVENOUS

## 2012-12-19 MED ORDER — CHLORHEXIDINE GLUCONATE 0.12 % MT SOLN
15.0000 mL | Freq: Two times a day (BID) | OROMUCOSAL | Status: DC
  Administered 2012-12-19 – 2012-12-20 (×2): 15 mL via OROMUCOSAL
  Filled 2012-12-19 (×2): qty 15

## 2012-12-19 MED ORDER — ACETAMINOPHEN 325 MG PO TABS
650.0000 mg | ORAL_TABLET | ORAL | Status: DC | PRN
  Administered 2012-12-21 – 2012-12-22 (×4): 650 mg via ORAL
  Filled 2012-12-19 (×4): qty 2

## 2012-12-19 MED ORDER — PANTOPRAZOLE SODIUM 40 MG IV SOLR
40.0000 mg | Freq: Every day | INTRAVENOUS | Status: DC
  Administered 2012-12-19 – 2012-12-20 (×2): 40 mg via INTRAVENOUS
  Filled 2012-12-19 (×3): qty 40

## 2012-12-19 NOTE — Transfer of Care (Signed)
Immediate Anesthesia Transfer of Care Note  Patient: Carl Wood  Procedure(s) Performed: * No procedures listed *  Patient Location: PACU  Anesthesia Type:General  Level of Consciousness: sedated  Airway & Oxygen Therapy: Patient remains intubated per anesthesia plan and Patient placed on Ventilator (see vital sign flow sheet for setting)  Post-op Assessment: Report given to PACU RN and Post -op Vital signs reviewed and stable  Post vital signs: Reviewed and stable  Complications: No apparent anesthesia complications

## 2012-12-19 NOTE — ED Notes (Signed)
Dr. Roseanne Reno at the bedside performing exam.

## 2012-12-19 NOTE — ED Notes (Signed)
CBG 125 °

## 2012-12-19 NOTE — Anesthesia Procedure Notes (Signed)
Procedure Name: Intubation Date/Time: 12/19/2012 1:27 PM Performed by: Gayla Medicus Pre-anesthesia Checklist: Patient identified, Patient being monitored, Timeout performed, Emergency Drugs available and Suction available Patient Re-evaluated:Patient Re-evaluated prior to inductionOxygen Delivery Method: Circle system utilized Preoxygenation: Pre-oxygenation with 100% oxygen Intubation Type: IV induction, Rapid sequence and Cricoid Pressure applied Laryngoscope Size: Mac and 3 Grade View: Grade II Tube type: Subglottic suction tube Tube size: 7.5 mm Number of attempts: 1 Airway Equipment and Method: Stylet Placement Confirmation: ETT inserted through vocal cords under direct vision,  positive ETCO2 and breath sounds checked- equal and bilateral Secured at: 24 cm Tube secured with: Tape Dental Injury: Teeth and Oropharynx as per pre-operative assessment

## 2012-12-19 NOTE — Consult Note (Addendum)
Referring Physician: Dr. Roselyn Bering    Chief Complaint: Speech difficulty and right hemiparesis.  HPI: Carl Wood is an 72 y.o. male with a history of atrial fibrillation on Coumadin, hypertension and hyperlipidemia as well as defibrillator implantation, presenting with acute onset of speech difficulty as well as right facial and extremity weakness. Patient was last seen normal at 8:45 AM today. Still previous history of stroke nor TIA. CT scan of his head showed no acute intracranial abnormality. Patient's deficits began improving in route to the emergency room and continued to improve. Initial NIH stroke score was 5. INR was 1.49. Subsequent NIH stroke score was 1. TPA was not given because of rapid improvement in minimal residual deficits.  LSN: 8:45 AM today tPA Given: No: Rapidly improving deficits mRankin:  0  Past Medical History  Diagnosis Date  . PE (pulmonary embolism)   . Colon polyps   . CHF (congestive heart failure)   . CAD (coronary artery disease)   . Diabetes mellitus   . Diverticulosis of colon   . Hyperlipidemia   . Hypertension   . MI (mitral incompetence)   . Nephrolithiasis   . V-tach     Family History  Problem Relation Age of Onset  . Colon cancer Father   . Diabetes Father   . Heart disease Father   . Bladder Cancer Mother   . Heart disease Mother      Medications:  Prior to Admission:  Lipitor 80 mg per day Coreg 6.25 mg twice a day Vasotec 10 mg twice a Omega-3 fatty acid 2000 mg per day Lanoxin 0.25 mg per day Nasonex nasal spray daily Coumadin 2.5 mg on Wednesday and Sunday, and 5 mg all other days   Physical Examination: Blood pressure 117/74, pulse 68, temperature 98.5 F (36.9 C), temperature source Oral, resp. rate 18, weight 90.719 kg (200 lb), SpO2 95.00%.  Neurologic Examination: Mental Status: Alert, oriented, thought content appropriate.  Minimal receptive and expressive aphasia. Slight difficulty with understanding  instructions and following commands. Cranial Nerves: II-Visual fields were normal. III/IV/VI-Pupils were equal and reacted. Extraocular movements were full and conjugate.    V/VII-mild right lower facial weakness facial weakness. VIII-normal. X-slight dysarthria. XII-midline tongue extension Motor: 5/5 bilaterally with normal tone and bulk Sensory: Normal throughout. Deep Tendon Reflexes: 1+ and symmetric. Plantars: Flexor bilaterally Cerebellar: Normal finger-to-nose testing.  Ct Head Wo Contrast  12/19/2012  *RADIOLOGY REPORT*  Clinical Data: Right facial droop, aphasia  CT HEAD WITHOUT CONTRAST  Technique:  Contiguous axial images were obtained from the base of the skull through the vertex without contrast.  Comparison: None.  Findings: No evidence of parenchymal hemorrhage or extra-axial fluid collection. No mass lesion, mass effect, or midline shift.  No CT evidence of acute infarction.  Cerebral volume is age appropriate.  No ventriculomegaly.  Very mild partial opacification of the bilateral maxillary sinuses. The mastoid air cells are clear.  No evidence of calvarial fracture.  IMPRESSION: No evidence of acute intracranial abnormality.  These results were called by telephone on 12/19/2012 at 1000 hours to Dr. Noel Christmas, who verbally acknowledged these results.   Original Report Authenticated By: Charline Bills, M.D.     Assessment: 72 y.o. male with atrial fibrillation and subtherapeutic INR presenting with probable left MCA territory TIA. Small acute stroke cannot be ruled out, however. Further imaging with MRI cannot be done because of patient's implanted defibrillator.  Stroke Risk Factors - atrial fibrillation, hyperlipidemia and hypertension  Plan: 1. HgbA1c,  fasting lipid panel 2. CTA of head and neck with contrast 3. PT consult, OT consult, Speech consult 4. Echocardiogram 5. Prophylactic therapy-Anticoagulation: Coumadin (Pharmacy to manage) 6. Risk factor  modification 7. Telemetry monitoring 8. Aspirin 81 mg per day until INR is therapeutic   C.R. Roseanne Reno, MD [facility]  [phone]  12/19/2012, 10:48 AM

## 2012-12-19 NOTE — Consult Note (Addendum)
Patient's PCP: Judie Petit, MD Consulting physician: Dr. Lynelle Doctor  Chief Complaint: Right-sided weakness and right facial droop  History of Present Illness: Carl Wood is a 72 y.o. Caucasian male with history of pulmonary embolism, congestive heart failure uncertain if diastolic or systolic, coronary artery disease, tobacco use, diabetes, hyperlipidemia, and hypertension who presents with the above complaints.  Patient was somnolent, his wife and daughter provided most.  Wife noted that patient woke up around 830 a.m. which she did not witness.  Patient was last seen normal at 845 a.m.  Patient went to the kitchen but did not observe any right-sided weakness or right facial droop.  However she heard something in the kitchen and she noted that he had right-sided weakness with right facial droop.  EMS was called and code stroke was activated.  Patient's symptoms improved in the emergency department.  Head CT did not show any acute intracranial abnormality.  Initially TPA was not given because of rapid improvement with minimal residual deficits.  Hospitalist service was initially consulted to help admit the patient for further care and management.  However around noon patient became increasingly lethargic with right-sided weakness and right facial droop, patient was taken to interventional radiology for TPA.  Patient was also recently evaluated by cardiology as his ICD was interrogated on 11/30/2012 as he had noted that his ICD has gone off recently multiple times.  Review of Systems: Unobtainable from the patient due to altered mental status.  Past Medical History  Diagnosis Date  . PE (pulmonary embolism)   . Colon polyps   . CHF (congestive heart failure)   . CAD (coronary artery disease)   . Diabetes mellitus   . Diverticulosis of colon   . Hyperlipidemia   . Hypertension   . MI (mitral incompetence)   . Nephrolithiasis   . V-tach    Past Surgical History  Procedure Date  .  Back surgery   . Cardiac defibrillator placement     medtronic virtuoso  . Basal cell carcinoma excision     nose  . Knee arthroplasty   . Cholecystectomy   . Colonoscopy 12/11/2012    Procedure: COLONOSCOPY;  Surgeon: Louis Meckel, MD;  Location: [facility];  Service: Endoscopy;  Laterality: N/A;   Family History  Problem Relation Age of Onset  . Colon cancer Father   . Diabetes Father   . Heart disease Father   . Bladder Cancer Mother   . Heart disease Mother    History   Social History  . Marital Status: Married    Spouse Name: N/A    Number of Children: 3  . Years of Education: N/A   Occupational History  . Retired    Social History Main Topics  . Smoking status: Current Every Day Smoker -- 0.5 packs/day    Types: Cigarettes  . Smokeless tobacco: Never Used  . Alcohol Use: No  . Drug Use: No  . Sexually Active: Not on file   Other Topics Concern  . Not on file   Social History Narrative  . No narrative on file   Allergies: Penicillins and Sulfamethoxazole  Home Meds: Prior to Admission medications   Medication Sig Start Date End Date Taking? Authorizing Provider  atorvastatin (LIPITOR) 80 MG tablet TAKE ONE TABLET BY MOUTH EVERY DAY AS DIRECTED 12/08/11   Lindley Magnus, MD  carvedilol (COREG) 6.25 MG tablet TAKE ONE TABLET BY MOUTH TWICE DAILY 05/08/12   Duke Salvia, MD  enalapril (VASOTEC)  10 MG tablet TAKE ONE TABLET BY MOUTH TWICE DAILY 11/20/12   Duke Salvia, MD  fish oil-omega-3 fatty acids 1000 MG capsule Take 2 g by mouth daily.      Historical Provider, MD  LANOXIN 0.25 MG tablet Take 0.5 tablets (0.125 mg total) by mouth daily. 11/07/12   Duke Salvia, MD  mometasone (NASONEX) 50 MCG/ACT nasal spray Place 1 spray into the nose daily. 11/07/12   Lindley Magnus, MD  Na Sulfate-K Sulfate-Mg Sulf (SUPREP BOWEL PREP) SOLN Take 1 kit by mouth once. 10/16/12   Louis Meckel, MD  warfarin (COUMADIN) 5 MG tablet 5 mg daily except 2.5 mg on Sunday  and Wednesday 06/07/11   Lindley Magnus, MD    Physical Exam: Blood pressure 117/74, pulse 68, temperature 98.5 F (36.9 C), temperature source Oral, resp. rate 18, weight 90.719 kg (200 lb), SpO2 95.00%. General: Awake but somnolent and confused to times, initially oriented to self and time but was not oriented x3, No acute distress. HEENT: EOMI, Moist mucous membranes Neck: Supple CV: S1 and S2 Lungs: Clear to ascultation bilaterally Abdomen: Soft, Nontender, Nondistended, +bowel sounds. Ext: Good pulses. Trace edema. No clubbing or cyanosis noted. Neuro: Cranial Nerves grossly intact.  He initially had 5/5 motor strength in upper and lower extremities, later had right hemiplegia.  Lab results:  Basename 12/19/12 1041 12/19/12 0955 12/19/12 0940  NA 141 141 --  K 4.1 4.2 --  CL 102 103 --  CO2 -- -- 27  GLUCOSE 114* 117* --  BUN 14 13 --  CREATININE 0.90 0.90 --  CALCIUM -- -- 9.3  MG -- -- --  PHOS -- -- --    Basename 12/19/12 0940  AST 21  ALT 19  ALKPHOS 80  BILITOT 0.6  PROT 7.1  ALBUMIN 3.7   No results found for this basename: LIPASE:2,AMYLASE:2 in the last 72 hours  Basename 12/19/12 1041 12/19/12 0955 12/19/12 0940  WBC -- -- 7.2  NEUTROABS -- -- 4.7  HGB 16.3 15.6 --  HCT 48.0 46.0 --  MCV -- -- 97.4  PLT -- -- 181    Basename 12/19/12 0940  CKTOTAL --  CKMB --  CKMBINDEX --  TROPONINI <0.30   No components found with this basename: POCBNP:3 No results found for this basename: DDIMER in the last 72 hours No results found for this basename: HGBA1C:2 in the last 72 hours No results found for this basename: CHOL:2,HDL:2,LDLCALC:2,TRIG:2,CHOLHDL:2,LDLDIRECT:2 in the last 72 hours No results found for this basename: TSH,T4TOTAL,FREET3,T3FREE,THYROIDAB in the last 72 hours No results found for this basename: VITAMINB12:2,FOLATE:2,FERRITIN:2,TIBC:2,IRON:2,RETICCTPCT:2 in the last 72 hours Imaging results:  Ct Head Wo Contrast  12/19/2012  *RADIOLOGY  REPORT*  Clinical Data: Right facial droop, aphasia  CT HEAD WITHOUT CONTRAST  Technique:  Contiguous axial images were obtained from the base of the skull through the vertex without contrast.  Comparison: None.  Findings: No evidence of parenchymal hemorrhage or extra-axial fluid collection. No mass lesion, mass effect, or midline shift.  No CT evidence of acute infarction.  Cerebral volume is age appropriate.  No ventriculomegaly.  Very mild partial opacification of the bilateral maxillary sinuses. The mastoid air cells are clear.  No evidence of calvarial fracture.  IMPRESSION: No evidence of acute intracranial abnormality.  These results were called by telephone on 12/19/2012 at 1000 hours to Dr. Noel Christmas, who verbally acknowledged these results.   Original Report Authenticated By: Charline Bills, M.D.    Other  results: EKG: Atrial fibrillation with a heart rate of 60.  Assessment & Plan by Problem: Right sided weakness with right facial droop likely due to CVA Head CT initially did not show any acute intracranial abnormality however repeat head CT showed increased conspicuity of the left MCA M1 segment.  Further management as per neurology.  Patient cannot get an MRI as he is on ICD.  2-D echocardiogram and carotid Dopplers as per neurology.  Status post ICD Reports his ICD has gone off recently and has been interrogated.  Requested cardiology consultation for interrogation and interpretation of his ICD.  Coronary artery disease/History of congestive heart failure/ischemic cardiomyopathy presumed systolic given patient is on ICD, EF unavailable for my review Appears compensated.  Continue to monitor.  Avoid excessive fluids.  History of DVT/PE On chronic anticoagulation with subtherapeutic INR.  Has had a colonoscopy about one week and was restarted on Coumadin.  Anticoagulation as per neurology in the setting of intra-arterial TPA.  Atrial fibrillation Rate controlled.  Continue  carvedilol.  Resumption of anticoagulation as per neurology.  Hypertension Consider holding home enalapril to allow for permissive hypertension for the next 24 hours.  Hyperlipidemia Continue statin.  Chronic sinus nasal congestion Continue Flonase substitute for Nasonex.  Diabetes Per patient's wife diet controlled.  Not on any diabetic medications.  Prophylaxis As per neurology.  CODE STATUS Full code.  Discussed with family at the time of admission.  As patient will be going to 3100, neuro ICU, will sign off.  Please do not hesitate to contact us with any questions or concerns.  Time spent on consult, talking to the patient, family and coordinating care was: 60 mins.  Jagdeep Ancheta A, MD 12/19/2012, 12:09 PM

## 2012-12-19 NOTE — ED Notes (Signed)
Spoke with [place] Sink, RN from stroke team. Notified of patient's change in neurological status. Dr. Roseanne Reno to re-assess, possible need for CT angio. Family remains at bedside. Vital signs stable.

## 2012-12-19 NOTE — ED Provider Notes (Addendum)
History    CSN: [id number] Arrival date & time 12/19/12  4098 First MD Initiated Contact with Patient 12/19/12 [phone]      Chief Complaint  Patient presents with  . Code Stroke    HPI Pt presented to the ED with concerns of a possible stroke.  Pt initially got up this morning on his own.  Family noticed decreased movement on his right side as well as a facial droop.  EMS was called and a code stroke was activated.    Pt was well last night when he went to bed.  The patient's wife noticed the symptoms this morning. He had already been up and out of it but it's unclear if he was having any symptoms when he first woke up. Symptoms also seem to be improving while he is here in the emergency department. The weakness is much better. He does have some persistent aphasia.  Past Medical History  Diagnosis Date  . PE (pulmonary embolism)   . Colon polyps   . CHF (congestive heart failure)   . CAD (coronary artery disease)   . Diabetes mellitus   . Diverticulosis of colon   . Hyperlipidemia   . Hypertension   . MI (mitral incompetence)   . Nephrolithiasis   . V-tach     Past Surgical History  Procedure Date  . Back surgery   . Cardiac defibrillator placement     medtronic virtuoso  . Basal cell carcinoma excision     nose  . Knee arthroplasty   . Cholecystectomy   . Colonoscopy 12/11/2012    Procedure: COLONOSCOPY;  Surgeon: Louis Meckel, MD;  Location: [facility];  Service: Endoscopy;  Laterality: N/A;    Family History  Problem Relation Age of Onset  . Colon cancer Father   . Diabetes Father   . Heart disease Father   . Bladder Cancer Mother   . Heart disease Mother     History  Substance Use Topics  . Smoking status: Current Every Day Smoker -- 0.5 packs/day    Types: Cigarettes  . Smokeless tobacco: Never Used  . Alcohol Use: No      Review of Systems  All other systems reviewed and are negative.    Allergies  Penicillins and Sulfamethoxazole  Home  Medications   Current Outpatient Rx  Name  Route  Sig  Dispense  Refill  . ATORVASTATIN CALCIUM 80 MG PO TABS      TAKE ONE TABLET BY MOUTH EVERY DAY AS DIRECTED   30 tablet   5   . CARVEDILOL 6.25 MG PO TABS      TAKE ONE TABLET BY MOUTH TWICE DAILY   180 tablet   3   . ENALAPRIL MALEATE 10 MG PO TABS      TAKE ONE TABLET BY MOUTH TWICE DAILY   60 tablet   9   . OMEGA-3 FATTY ACIDS 1000 MG PO CAPS   Oral   Take 2 g by mouth daily.           Marland Kitchen LANOXIN 0.25 MG PO TABS   Oral   Take 0.5 tablets (0.125 mg total) by mouth daily.   90 each   0     Dispense as written.   . MOMETASONE FUROATE 50 MCG/ACT NA SUSP   Nasal   Place 1 spray into the nose daily.   17 g   12   . NA SULFATE-K SULFATE-MG SULF PO SOLN   Oral  Take 1 kit by mouth once.   1 Bottle   0   . WARFARIN SODIUM 5 MG PO TABS      5 mg daily except 2.5 mg on Sunday and Wednesday           BP 121/65  Pulse 69  Temp 98.5 F (36.9 C) (Oral)  Resp 16  SpO2 97%  Physical Exam  Nursing note and vitals reviewed. Constitutional: He appears well-developed and well-nourished. No distress.  HENT:  Head: Normocephalic and atraumatic.  Right Ear: External ear normal.  Left Ear: External ear normal.  Eyes: Conjunctivae normal are normal. Right eye exhibits no discharge. Left eye exhibits no discharge. No scleral icterus.  Neck: Neck supple. No tracheal deviation present.  Cardiovascular: Normal rate.   Pulmonary/Chest: Effort normal. No stridor. No respiratory distress.       Occasional cough  Musculoskeletal: He exhibits no edema.  Neurological: He is alert. He is disoriented (disoriented to date and location). Cranial nerve deficit: no gross deficits.       Please see the neurologic evaluation from the stroke team  Skin: Skin is warm and dry. No rash noted.  Psychiatric: He has a normal mood and affect.    ED Course  Procedures (including critical care time) EKG Atrial fibrillation with a  rate of 60 left bundle branch block Normal axis Compared to prior tracings no significant changes have occurred Labs Reviewed  POCT I-STAT, CHEM 8 - Abnormal; Notable for the following:    Glucose, Bld 117 (*)     All other components within normal limits  GLUCOSE, CAPILLARY - Abnormal; Notable for the following:    Glucose-Capillary 125 (*)     All other components within normal limits  POCT I-STAT TROPONIN I  PROTIME-INR  APTT  CBC  DIFFERENTIAL  COMPREHENSIVE METABOLIC PANEL  TROPONIN I   Ct Head Wo Contrast  12/19/2012  *RADIOLOGY REPORT*  Clinical Data: Right facial droop, aphasia  CT HEAD WITHOUT CONTRAST  Technique:  Contiguous axial images were obtained from the base of the skull through the vertex without contrast.  Comparison: None.  Findings: No evidence of parenchymal hemorrhage or extra-axial fluid collection. No mass lesion, mass effect, or midline shift.  No CT evidence of acute infarction.  Cerebral volume is age appropriate.  No ventriculomegaly.  Very mild partial opacification of the bilateral maxillary sinuses. The mastoid air cells are clear.  No evidence of calvarial fracture.  IMPRESSION: No evidence of acute intracranial abnormality.  These results were called by telephone on 12/19/2012 at 1000 hours to Dr. Noel Christmas, who verbally acknowledged these results.   Original Report Authenticated By: Charline Bills, M.D.       MDM  The patient presented to the emergency room as a code stroke. Neurology team decided to not administered TPA based on the fact that his symptoms were improving. The time of onset of the symptoms is also uncertain.  Patient does appear to have anacute cerebrovascular event based on his symptoms. He will be admitted to the hospital for further treatment and evaluation       Celene Kras, MD 12/19/12 1013

## 2012-12-19 NOTE — ED Notes (Signed)
Pt arrived to ED per [facility] for evaluation of possible code stroke. LSN: 8:45am this morning per wife. Wife reports that patient was able to dress himself and shave this morning. States she noticed R sided facial droop this morning. Upon arrival R sided facial droop and R sided weakness noted. CBG 116. 18g L forearm. Pt is alert, can follow commands, but confused.

## 2012-12-19 NOTE — Procedures (Signed)
S/P bilateral common carotid arteriogram,followed by complete revascularization of T occlusion of the LT ICA terminus.,using 7.4 mg of IA TPA and the trevoprovue retrieval device.

## 2012-12-19 NOTE — Anesthesia Preprocedure Evaluation (Addendum)
Anesthesia Evaluation  Patient identified by MRN, date of birth, ID band Patient awake    Reviewed: Allergy & Precautions, H&P , NPO status , Patient's Chart, lab work & pertinent test results, Unable to perform ROS - Chart review only  History of Anesthesia Complications Negative for: history of anesthetic complications  Airway Mallampati: III TM Distance: >3 FB Neck ROM: Full    Dental  (+) Caps and Teeth Intact   Pulmonary Current Smoker, PE breath sounds clear to auscultation  Pulmonary exam normal       Cardiovascular hypertension, + CAD and + Past MI + dysrhythmias Ventricular Tachycardia + Cardiac Defibrillator Rhythm:Irregular Rate:Tachycardia  '10 stress test: large LAD infarct, EF 27%, no ischemia   Neuro/Psych CVA (acute stroke with large MCA clot, hemiparetic and aphasic)    GI/Hepatic   Endo/Other    Renal/GU Renal disease     Musculoskeletal   Abdominal   Peds  Hematology   Anesthesia Other Findings   Reproductive/Obstetrics                          Anesthesia Physical Anesthesia Plan  ASA: IV and emergent  Anesthesia Plan: General   Post-op Pain Management:    Induction: Intravenous  Airway Management Planned: Oral ETT  Additional Equipment: Arterial line  Intra-op Plan:   Post-operative Plan: Post-operative intubation/ventilation  Informed Consent: I have reviewed the patients History and Physical, chart, labs and discussed the procedure including the risks, benefits and alternatives for the proposed anesthesia with the patient or authorized representative who has indicated his/her understanding and acceptance.   Only emergency history available  Plan Discussed with: Surgeon and CRNA  Anesthesia Plan Comments: (Plan routine monitors, A line, GETA with post op ventilation )        Anesthesia Quick Evaluation

## 2012-12-19 NOTE — Consult Note (Signed)
PULMONARY  / CRITICAL CARE MEDICINE  Name: Carl Wood MRN: [id number] DOB: 1940-12-10    LOS: 0  REFERRING MD:  Stroke Pearlean Brownie)  CHIEF COMPLAINT:  Acute respiratory failure  BRIEF PATIENT DESCRIPTION: 72 yo admitted on 1/14 with acute CVA s/p intraarterial tPA infusion and thrombectomy.  Brought to ICU intubated.  LINES / TUBES: OETT 1/14 >>> OGT 1/14 >>> L rad A-line 1/14 >>> R fem A-line 1/14 >>> Foley 1/14 >>>  CULTURES:  ANTIBIOTICS:  SIGNIFICANT EVENTS:  1/14  Admitted with acute SVA.  Given intraarterial tPA.  Underwent thrombectomy.  Brought to ICU intubated.  LEVEL OF CARE:  ICU  PRIMARY SERVICE:  Stroke   CONSULTANTS:  PCCM  CODE STATUS:  Full  DIET:  NPO  DVT Px:  SCDs  GI Px:  Protonix   The patient is encephalopathic and unable to provide history, which was obtained for available medical records.  HISTORY OF PRESENT ILLNESS:  72 yo admitted on 1/14 with acute CVA s/p intraarterial tPA infusion and thrombectomy.  Brought to ICU intubated.  PAST MEDICAL HISTORY :  Past Medical History  Diagnosis Date  . PE (pulmonary embolism)   . Colon polyps   . CHF (congestive heart failure)   . CAD (coronary artery disease)   . Diabetes mellitus   . Diverticulosis of colon   . Hyperlipidemia   . Hypertension   . MI (mitral incompetence)   . Nephrolithiasis   . V-tach    Past Surgical History  Procedure Date  . Back surgery   . Cardiac defibrillator placement     medtronic virtuoso  . Basal cell carcinoma excision     nose  . Knee arthroplasty   . Cholecystectomy   . Colonoscopy 12/11/2012    Procedure: COLONOSCOPY;  Surgeon: Louis Meckel, MD;  Location: [facility];  Service: Endoscopy;  Laterality: N/A;   Prior to Admission medications   Medication Sig Start Date End Date Taking? Authorizing Provider  atorvastatin (LIPITOR) 80 MG tablet TAKE ONE TABLET BY MOUTH EVERY DAY AS DIRECTED 12/08/11   Lindley Magnus, MD  carvedilol (COREG) 6.25 MG  tablet TAKE ONE TABLET BY MOUTH TWICE DAILY 05/08/12   Duke Salvia, MD  enalapril (VASOTEC) 10 MG tablet TAKE ONE TABLET BY MOUTH TWICE DAILY 11/20/12   Duke Salvia, MD  fish oil-omega-3 fatty acids 1000 MG capsule Take 2 g by mouth daily.      Historical Provider, MD  LANOXIN 0.25 MG tablet Take 0.5 tablets (0.125 mg total) by mouth daily. 11/07/12   Duke Salvia, MD  mometasone (NASONEX) 50 MCG/ACT nasal spray Place 1 spray into the nose daily. 11/07/12   Lindley Magnus, MD  Na Sulfate-K Sulfate-Mg Sulf (SUPREP BOWEL PREP) SOLN Take 1 kit by mouth once. 10/16/12   Louis Meckel, MD  warfarin (COUMADIN) 5 MG tablet 5 mg daily except 2.5 mg on Sunday and Wednesday 06/07/11   Lindley Magnus, MD   Allergies  Allergen Reactions  . Penicillins     REACTION: unspecified  . Sulfamethoxazole     REACTION: unspecified   FAMILY HISTORY:  Family History  Problem Relation Age of Onset  . Colon cancer Father   . Diabetes Father   . Heart disease Father   . Bladder Cancer Mother   . Heart disease Mother    SOCIAL HISTORY:  reports that he has been smoking Cigarettes.  He has been smoking about .5 packs per day. He  has never used smokeless tobacco. He reports that he does not drink alcohol or use illicit drugs.  REVIEW OF SYSTEMS:  Unable to provide.  INTERVAL HISTORY:  VITAL SIGNS: Temp:  [96.8 F (36 C)-98.5 F (36.9 C)] 97.6 F (36.4 C) (01/14 2000) Pulse Rate:  [57-78] 78  (01/14 2000) Resp:  [12-18] 13  (01/14 2000) BP: (108-132)/(55-83) 132/71 mmHg (01/14 2000) SpO2:  [95 %-100 %] 100 % (01/14 2000) Arterial Line BP: (121-144)/(63-77) 143/77 mmHg (01/14 2000) FiO2 (%):  [60 %-100 %] 100 % (01/14 2000) Weight:  [88.4 kg (194 lb 14.2 oz)-90.719 kg (200 lb)] 88.4 kg (194 lb 14.2 oz) (01/14 1748) HEMODYNAMICS:    VENTILATOR SETTINGS: Vent Mode:  [-] PRVC FiO2 (%):  [60 %-100 %] 100 % Set Rate:  [12 bmp-16 bmp] 12 bmp Vt Set:  [600 mL] 600 mL PEEP:  [5 cmH20] 5  cmH20 Plateau Pressure:  [14 cmH20] 14 cmH20  INTAKE / OUTPUT: Intake/Output      01/14 0701 - 01/15 0700   I.V. (mL/kg) 1500 (17)   Total Intake(mL/kg) 1500 (17)   Urine (mL/kg/hr) 375 (0.3)   Total Output 375   Net +1125         PHYSICAL EXAMINATION: General:  Mechanically ventilated, synchronous Neuro:  Encephalopathic, nonfocal, cough / gag diminished HEENT:  PERRL, OETT / OGT Cardiovascular:  RRR, no m/r/g Lungs:  Bilateral diminished air entry, no w/r/r Abdomen:  Soft, nontender, bowel sounds diminished Musculoskeletal:  No edema Skin:  Intact  LABS:  Lab 12/19/12 1930 12/19/12 1041 12/19/12 0955 12/19/12 0940  HGB -- 16.3 15.6 15.2  WBC -- -- -- 7.2  PLT -- -- -- 181  NA -- 141 141 138  K -- 4.1 4.2 --  CL -- 102 103 102  CO2 -- -- -- 27  GLUCOSE -- 114* 117* 123*  BUN -- 14 13 13   CREATININE -- 0.90 0.90 0.83  CALCIUM -- -- -- 9.3  MG -- -- -- --  PHOS -- -- -- --  AST -- -- -- 21  ALT -- -- -- 19  ALKPHOS -- -- -- 80  BILITOT -- -- -- 0.6  PROT -- -- -- 7.1  ALBUMIN -- -- -- 3.7  APTT -- -- -- 33  INR -- -- -- 1.49  LATICACIDVEN -- -- -- --  TROPONINI -- -- -- <0.30  PROCALCITON -- -- -- --  PROBNP -- -- -- --  O2SATVEN -- -- -- --  PHART 7.367 -- -- --  PCO2ART 42.6 -- -- --  PO2ART 471.0* -- -- --    Lab 12/19/12 1537 12/19/12 1006  GLUCAP 122* 125*   IMAGING: 1/14  PCXR >>> Somewhat high ETT, right lower lung field airspace disease 1/14  Head CT >>> Stable CT appearance of the brain. Continued increased conspicuity of the left MCA M1 segment suggesting thrombus in the setting. No intracranial hemorrhage or mass effect.  ECG:  DIAGNOSES: Principal Problem:  *CVA (cerebral infarction) Active Problems:  DIABETES MELLITUS, TYPE II  HYPERLIPIDEMIA  HYPERTENSION  CORONARY ARTERY DISEASE  CARDIOMYOPATHY, ISCHEMIC  CONGESTIVE HEART FAILURE  PULMONARY EMBOLISM, HX OF  DVT (deep venous thrombosis)  Automatic implantable cardiac  defibrillator  medtronic  Warfarin-induced coagulopathy  TIA (transient ischemic attack)  Atrial fibrillation  ASSESSMENT / PLAN:  PULMONARY  A:  Acute respiratory failure.  Possible aspiration.  History of PE.  P:   Gaol SpO2>92, pH>7.30 Full mechanical support Daily SBT Trend ABG / CXR  Resume anticoagulation when safe (per Neurology) Albuterol PRN  CARDIOVASCULAR  A:  H/o arrhythmia, s/p pacemaker AICD placement - per RN reported frequent firing weeks prior to admission, missed Cardiology appointment.  CHF without exacerbation.  CAD.  Dyslipidemia. P:  Goal SBP < 180 and DBP < 105 Goal K > 4, goal Mg > 2 Cardiology consult in AM ([name]) Cardene PRN Lipitor, Coreg, Vasotec, Lanoxin when able  RENAL  A:  Normal renal function. P:   NS@75  Mg level Trend BMP  GASTROINTESTINAL  A:  No active issues P:   NPO as intubated TF if remains intubated > 24 hours  HEMATOLOGIC  A:  No active issues. P:  Trend CBC Anticoagulation when safe  INFECTIOUS  A:  Possible aspiration but no evidence of infection. P:   Defer abx for now  ENDOCRINE   A:  Normoglycemia. P:   No interventions required  NEUROLOGIC  A:  Acute CVA s/p intraarterial tPA and thrombectomy. P:   Goal RASS 0 to -1 Propofol gtt for synchrony Per Neurology MR d/c'd as pacemaker  CLINICAL SUMMARY:  72 yo admitted on 1/14 with acute CVA s/p intraarterial tPA infusion and thrombectomy.  Brought to ICU intubated. Possible intubation in AM.  Need Cardiology consult in AM Graciela Husbands) for AICD frequent firing prior to admission - missed appointment.  Need to restart Coumadin with bridging (for PE) when safe by Neurology.  I have personally obtained a history, examined the patient, evaluated laboratory and imaging results, formulated the assessment and plan and placed orders.  CRITICAL CARE:  The patient is critically ill with multiple organ systems failure and requires high complexity decision making  for assessment and support, frequent evaluation and titration of therapies, application of advanced monitoring technologies and extensive interpretation of multiple databases. Critical Care Time devoted to patient care services described in this note is 40 minutes.   Lonia Farber, MD  Pulmonary and Critical Care Medicine [facility] Pager: [phone]  12/19/2012, 9:07 PM

## 2012-12-19 NOTE — Code Documentation (Signed)
Pt was at home with wife this AM when he peaked into the room where she was reading cookbooks, no conversation but looked his normal self. (This is there normal routine.)  He walked into the kitchen, and shortly after this she heard an unusual noise and found him propped on the stool beside the pantry.  He did not fall, but clearly was having RUE and RLE weakness, along with R facial droop and diff speaking.  He was able to say, "don't call anybody," but she proceeded to call 9-1-1.  EMS reported same sx as above, but added that he improved enroute.  On arrival, he was cleared for CT. They reported he had a colonoscopy 1/6. His coumadin for a fib was held 5 days before the procedure, but resumed later that day.  He was scheduled to have his INR checked today. Labs were drawn prior to CT. Iniital NIHSS was 5, but by the time we got back to the ED, there was improvement: now answers orientation questions correctly, only very mild word finding with speech much more fluent.  He continue to improve to 1.  Decision made not to give tPA since he was improving.  Hand-off with ED nurse, with discussion about need to do q 15 min neuro cks until pt outside tPA window at 1145. Also discussed plan with Dr. Lynelle Doctor.  Family aware of treatment plan and in agreement.  Of note, the pt's 70 yo son died on 12-05-12 following AMI. Needless to say, the family is quite stressed.  Emotional support given.

## 2012-12-19 NOTE — Preoperative (Signed)
Beta Blockers   Reason not to administer Beta Blockers:Not Applicable 

## 2012-12-19 NOTE — ED Notes (Signed)
CT called for STAT CT angio.

## 2012-12-19 NOTE — Anesthesia Postprocedure Evaluation (Signed)
  Anesthesia Post-op Note  Patient: Carl Wood  Procedure(s) Performed: * No procedures listed *  Patient Location: NICU  Anesthesia Type:General  Level of Consciousness: unresponsive and Patient remains intubated per anesthesia plan  Airway and Oxygen Therapy: Patient remains intubated per anesthesia plan and Patient placed on Ventilator (see vital sign flow sheet for setting)  Post-op Pain: none  Post-op Assessment: Post-op Vital signs reviewed and Patient's Cardiovascular Status Stable  Post-op Vital Signs: stable  Complications: No apparent anesthesia complications

## 2012-12-19 NOTE — ED Notes (Addendum)
Pt transported to CT scan at the time. Pt continues to be lethargic, speech slurred and unable to follow commands.

## 2012-12-19 NOTE — ED Notes (Signed)
anesthesia assumed care of pt.

## 2012-12-19 NOTE — ED Notes (Signed)
Code Stroke called @ 08:59. LSN: 08:45 per wife. Stroke team arrival @ 09:22. pt arrival 09:35, EDP exam @ 09:35. Pt arrival in CT @ 09:40. Pt not TPA candidate due to improving symptoms.

## 2012-12-19 NOTE — H&P (Signed)
Admission H&P    Chief Complaint: Acute onset of speech difficulty and right hemiparesis.  HPI: Carl Wood is an 72 y.o. male history of pulmonary embolism, atrial fibrillation, on Coumadin, congestive heart failure, diabetes mellitus, hypertension and hyperlipidemia, presenting with onset of speech difficulty and right-sided weakness at 8:45 AM today. Has no previous history of stroke nor TIA. Patient has been on Coumadin long-term Coumadin suspended 2 weeks ago for colonoscopy. Coumadin was restarted on 12/11/2012. INR today was 1.49. Initial CT scan showed no acute intracranial abnormality. Initial NIH stroke score was 5. Patient improved in the emergency room to a stroke score of 1. Thrombolytic therapy with IV TPA was not elected because of the minimal residual neurologic deficits. Shortly before noon patient had a relapse of deficits with expressive aphasia and right hemiplegia. Repeat CT scan showed hypodensity involving the left MCA indicative of intravascular thrombus. Patient was taken to interventional radiology. Cerebral angiography demonstrated thrombosis involving the left internal carotid artery just above the common carotid artery bifurcation and extending into the middle cerebral and anterior cerebral artery branches. Patient subsequently underwent intra-arterial TPA infusion followed by thrombectomy. MCA territory circulation was restored and all branches. Patient had good collateral filling of the left ACA via the anterior communicating artery. He was subsequently admitted to the neuro intensive care unit for further management.  LSN: 8:45 AM today tPA Given: No: Rapid improvement in deficits; beyond time window for treatment when deficits recurred. mRankin: 4  Past Medical History  Diagnosis Date  . PE (pulmonary embolism)   . Colon polyps   . CHF (congestive heart failure)   . CAD (coronary artery disease)   . Diabetes mellitus   . Diverticulosis of colon   .  Hyperlipidemia   . Hypertension   . MI (mitral incompetence)   . Nephrolithiasis   . V-tach     Past Surgical History  Procedure Date  . Back surgery   . Cardiac defibrillator placement     medtronic virtuoso  . Basal cell carcinoma excision     nose  . Knee arthroplasty   . Cholecystectomy   . Colonoscopy 12/11/2012    Procedure: COLONOSCOPY;  Surgeon: Louis Meckel, MD;  Location: [facility];  Service: Endoscopy;  Laterality: N/A;    Family History  Problem Relation Age of Onset  . Colon cancer Father   . Diabetes Father   . Heart disease Father   . Bladder Cancer Mother   . Heart disease Mother    Social History:  reports that he has been smoking Cigarettes.  He has been smoking about .5 packs per day. He has never used smokeless tobacco. He reports that he does not drink alcohol or use illicit drugs.  Allergies:  Allergies  Allergen Reactions  . Penicillins     REACTION: unspecified  . Sulfamethoxazole     REACTION: unspecified    Medications Prior to Admission  Medication Sig Dispense Refill  . atorvastatin (LIPITOR) 80 MG tablet TAKE ONE TABLET BY MOUTH EVERY DAY AS DIRECTED  30 tablet  5  . carvedilol (COREG) 6.25 MG tablet TAKE ONE TABLET BY MOUTH TWICE DAILY  180 tablet  3  . enalapril (VASOTEC) 10 MG tablet TAKE ONE TABLET BY MOUTH TWICE DAILY  60 tablet  9  . fish oil-omega-3 fatty acids 1000 MG capsule Take 2 g by mouth daily.        Marland Kitchen LANOXIN 0.25 MG tablet Take 0.5 tablets (0.125 mg total) by mouth  daily.  90 each  0  . mometasone (NASONEX) 50 MCG/ACT nasal spray Place 1 spray into the nose daily.  17 g  12  . Na Sulfate-K Sulfate-Mg Sulf (SUPREP BOWEL PREP) SOLN Take 1 kit by mouth once.  1 Bottle  0  . warfarin (COUMADIN) 5 MG tablet 5 mg daily except 2.5 mg on Sunday and Wednesday        ROS: 14 point review of systems was unremarkable except for recent colonoscopy, and current presenting deficits.  Physical Examination: Blood pressure 117/74,  pulse 68, temperature 98.5 F (36.9 C), temperature source Oral, resp. rate 18, weight 90.719 kg (200 lb), SpO2 95.00%.  General: Awake but somnolent and confused to times, initially oriented to self and time but was not oriented x3, No acute distress.  HEENT: EOMI, Moist mucous membranes  Neck: Supple  CV: S1 and S2  Lungs: Clear to ascultation bilaterally  Abdomen: Soft, Nontender, Nondistended, +bowel sounds.  Ext: Good pulses. Trace edema. No clubbing or cyanosis noted.   Neurologic Examination: Mental Status: Lethargic, severe expressive aphasia. Cranial Nerves: II-dense right homonymous hemianopsia. III/IV/VI-Pupils were equal and reacted. Extraocular movements were full and conjugate.    V/VII-moderately severe right lower facial weakness. VIII-normal. X-no speech output. Motor: 0/5 strength of right upper and lower extremities; normal strength on the left. Deep Tendon Reflexes: 2+ and symmetric. Plantars: Right Babinski sign Carotid auscultation: Normal   Results for orders placed during the hospital encounter of 12/19/12 (from the past 48 hour(s))  PROTIME-INR     Status: Abnormal   Collection Time   12/19/12  9:40 AM      Component Value Range Comment   Prothrombin Time 17.6 (*) 11.6 - 15.2 seconds    INR 1.49  0.00 - 1.49   APTT     Status: Normal   Collection Time   12/19/12  9:40 AM      Component Value Range Comment   aPTT 33  24 - 37 seconds   CBC     Status: Normal   Collection Time   12/19/12  9:40 AM      Component Value Range Comment   WBC 7.2  4.0 - 10.5 K/uL    RBC 4.57  4.22 - 5.81 MIL/uL    Hemoglobin 15.2  13.0 - 17.0 g/dL    HCT 96.0  45.4 - 09.8 %    MCV 97.4  78.0 - 100.0 fL    MCH 33.3  26.0 - 34.0 pg    MCHC 34.2  30.0 - 36.0 g/dL    RDW 11.9  14.7 - 82.9 %    Platelets 181  150 - 400 K/uL   DIFFERENTIAL     Status: Normal   Collection Time   12/19/12  9:40 AM      Component Value Range Comment   Neutrophils Relative 65  43 - 77 %     Neutro Abs 4.7  1.7 - 7.7 K/uL    Lymphocytes Relative 20  12 - 46 %    Lymphs Abs 1.5  0.7 - 4.0 K/uL    Monocytes Relative 11  3 - 12 %    Monocytes Absolute 0.8  0.1 - 1.0 K/uL    Eosinophils Relative 4  0 - 5 %    Eosinophils Absolute 0.3  0.0 - 0.7 K/uL    Basophils Relative 1  0 - 1 %    Basophils Absolute 0.0  0.0 - 0.1 K/uL   COMPREHENSIVE  METABOLIC PANEL     Status: Abnormal   Collection Time   12/19/12  9:40 AM      Component Value Range Comment   Sodium 138  135 - 145 mEq/L    Potassium 4.2  3.5 - 5.1 mEq/L    Chloride 102  96 - 112 mEq/L    CO2 27  19 - 32 mEq/L    Glucose, Bld 123 (*) 70 - 99 mg/dL    BUN 13  6 - 23 mg/dL    Creatinine, Ser 4.09  0.50 - 1.35 mg/dL    Calcium 9.3  8.4 - 81.1 mg/dL    Total Protein 7.1  6.0 - 8.3 g/dL    Albumin 3.7  3.5 - 5.2 g/dL    AST 21  0 - 37 U/L    ALT 19  0 - 53 U/L    Alkaline Phosphatase 80  39 - 117 U/L    Total Bilirubin 0.6  0.3 - 1.2 mg/dL    GFR calc non Af Amer 88 (*) >90 mL/min    GFR calc Af Amer >90  >90 mL/min   TROPONIN I     Status: Normal   Collection Time   12/19/12  9:40 AM      Component Value Range Comment   Troponin I <0.30  <0.30 ng/mL   POCT I-STAT TROPONIN I     Status: Normal   Collection Time   12/19/12  9:53 AM      Component Value Range Comment   Troponin i, poc 0.00  0.00 - 0.08 ng/mL    Comment 3            POCT I-STAT, CHEM 8     Status: Abnormal   Collection Time   12/19/12  9:55 AM      Component Value Range Comment   Sodium 141  135 - 145 mEq/L    Potassium 4.2  3.5 - 5.1 mEq/L    Chloride 103  96 - 112 mEq/L    BUN 13  6 - 23 mg/dL    Creatinine, Ser 9.14  0.50 - 1.35 mg/dL    Glucose, Bld 782 (*) 70 - 99 mg/dL    Calcium, Ion 9.56  2.13 - 1.30 mmol/L    TCO2 27  0 - 100 mmol/L    Hemoglobin 15.6  13.0 - 17.0 g/dL    HCT 08.6  57.8 - 46.9 %   GLUCOSE, CAPILLARY     Status: Abnormal   Collection Time   12/19/12 10:06 AM      Component Value Range Comment   Glucose-Capillary 125  (*) 70 - 99 mg/dL   POCT I-STAT, CHEM 8     Status: Abnormal   Collection Time   12/19/12 10:41 AM      Component Value Range Comment   Sodium 141  135 - 145 mEq/L    Potassium 4.1  3.5 - 5.1 mEq/L    Chloride 102  96 - 112 mEq/L    BUN 14  6 - 23 mg/dL    Creatinine, Ser 6.29  0.50 - 1.35 mg/dL    Glucose, Bld 528 (*) 70 - 99 mg/dL    Calcium, Ion 4.13  2.44 - 1.30 mmol/L    TCO2 28  0 - 100 mmol/L    Hemoglobin 16.3  13.0 - 17.0 g/dL    HCT 01.0  27.2 - 53.6 %   GLUCOSE, CAPILLARY     Status: Abnormal  Collection Time   12/19/12  3:37 PM      Component Value Range Comment   Glucose-Capillary 122 (*) 70 - 99 mg/dL    Ct Head Wo Contrast  12/19/2012  *RADIOLOGY REPORT*  Clinical Data: 72 year old male status post Neurointervention for Code stroke.  CT HEAD WITHOUT CONTRAST  Technique:  Contiguous axial images were obtained from the base of the skull through the vertex without contrast.  Comparison: 1230 hours the same day and earlier.  Findings: The patient is intubated.  Stable paranasal sinuses and mastoids.  Fluid in the pharynx. Visualized orbits and scalp soft tissues are within normal limits.  No acute osseous abnormality identified.  There is still evidence of intravascular contrast.  Asymmetric conspicuity of the left MCA M1 segment persists.  No ventriculomegaly.  No midline shift or mass effect.  Basilar cisterns remain patent.  Gray-white matter differentiation throughout the brain appears stable.  No acute cortically based infarct is evident. No acute intracranial hemorrhage identified.  IMPRESSION: Stable CT appearance of the brain. Continued increased conspicuity of the left MCA M1 segment suggesting thrombus in the setting.  No intracranial hemorrhage or mass effect.   Original Report Authenticated By: Erskine Speed, M.D.    Ct Head Wo Contrast  12/19/2012  *RADIOLOGY REPORT*  Clinical Data: Code stroke.  Right facial droop, slurred speech.  CT HEAD WITHOUT CONTRAST  Technique:   Contiguous axial images were obtained from the base of the skull through the vertex without contrast.  Comparison: 12/19/2012  Findings: The left middle cerebral artery appears dense.  Cannot exclude MCA thrombus.  No changes within the left cerebral hemisphere of acute infarction currently.  No hemorrhage.  No hydrocephalus or midline shift.  IMPRESSION: Question increased density of the left middle cerebral artery suggesting thrombosis.   Original Report Authenticated By: Charlett Nose, M.D.    Ct Head Wo Contrast  12/19/2012  *RADIOLOGY REPORT*  Clinical Data: Right facial droop, aphasia  CT HEAD WITHOUT CONTRAST  Technique:  Contiguous axial images were obtained from the base of the skull through the vertex without contrast.  Comparison: None.  Findings: No evidence of parenchymal hemorrhage or extra-axial fluid collection. No mass lesion, mass effect, or midline shift.  No CT evidence of acute infarction.  Cerebral volume is age appropriate.  No ventriculomegaly.  Very mild partial opacification of the bilateral maxillary sinuses. The mastoid air cells are clear.  No evidence of calvarial fracture.  IMPRESSION: No evidence of acute intracranial abnormality.  These results were called by telephone on 12/19/2012 at 1000 hours to Dr. Noel Christmas, who verbally acknowledged these results.   Original Report Authenticated By: Charline Bills, M.D.     Assessment: 72 y.o. male initially presenting with what appeared to be transient ischemic attack involving the left middle cerebral artery territory with almost complete resolution, only to be followed by recurrence of the more severe expressive aphasia and development of complete right hemiplegia. Left internal carotid thrombus with extension into the left MCA and ACA branches was found on angiography. Patient underwent successful thrombectomy and restoration of middle cerebral artery flow. There was good collateral flow to the left ACA by way of the anterior  communicating artery.  Stroke Risk Factors - atrial fibrillation, diabetes mellitus, hyperlipidemia and hypertension  Plan: 1. HgbA1c, fasting lipid panel 2. MRI of the brain without contrast 3. PT consult, OT consult, Speech consult 4. Echocardiogram 5. Prophylactic therapy-resumption of anticoagulation with Coumadin when safe to do so. 6. Risk factor  modification 7. Telemetry monitoring  Patient's evaluation and management required complex clinical evaluation and diagnostic studies as well as complex management decision-making, and family consultation. Total critical care time was 2 and half hours.  C.R. Roseanne Reno, MD [facility] [phone]  12/19/2012, 6:36 PM

## 2012-12-19 NOTE — ED Notes (Signed)
Pt transported to IR for procedure. RN given report. Spoke with bed placement and flow manager, bed assignment changed to [id number].

## 2012-12-20 ENCOUNTER — Inpatient Hospital Stay (HOSPITAL_COMMUNITY): Payer: Medicare Other

## 2012-12-20 ENCOUNTER — Encounter (HOSPITAL_COMMUNITY): Payer: Self-pay | Admitting: Radiology

## 2012-12-20 DIAGNOSIS — I517 Cardiomegaly: Secondary | ICD-10-CM

## 2012-12-20 DIAGNOSIS — Z95810 Presence of automatic (implantable) cardiac defibrillator: Secondary | ICD-10-CM

## 2012-12-20 DIAGNOSIS — I472 Ventricular tachycardia: Secondary | ICD-10-CM

## 2012-12-20 DIAGNOSIS — I4729 Other ventricular tachycardia: Secondary | ICD-10-CM

## 2012-12-20 DIAGNOSIS — D126 Benign neoplasm of colon, unspecified: Secondary | ICD-10-CM

## 2012-12-20 DIAGNOSIS — I4891 Unspecified atrial fibrillation: Secondary | ICD-10-CM

## 2012-12-20 LAB — GLUCOSE, CAPILLARY: Glucose-Capillary: 82 mg/dL (ref 70–99)

## 2012-12-20 LAB — CBC WITH DIFFERENTIAL/PLATELET
Basophils Absolute: 0 10*3/uL (ref 0.0–0.1)
Basophils Relative: 0 % (ref 0–1)
HCT: 41.3 % (ref 39.0–52.0)
Hemoglobin: 14.2 g/dL (ref 13.0–17.0)
Lymphocytes Relative: 7 % — ABNORMAL LOW (ref 12–46)
MCHC: 34.4 g/dL (ref 30.0–36.0)
Monocytes Absolute: 0.9 10*3/uL (ref 0.1–1.0)
Monocytes Relative: 8 % (ref 3–12)
Neutro Abs: 9.4 10*3/uL — ABNORMAL HIGH (ref 1.7–7.7)
Neutrophils Relative %: 84 % — ABNORMAL HIGH (ref 43–77)
WBC: 11.2 10*3/uL — ABNORMAL HIGH (ref 4.0–10.5)

## 2012-12-20 LAB — BASIC METABOLIC PANEL
BUN: 8 mg/dL (ref 6–23)
CO2: 21 mEq/L (ref 19–32)
Chloride: 106 mEq/L (ref 96–112)
GFR calc Af Amer: >90 mL/min (ref >90)
GFR calc non Af Amer: >90 mL/min (ref >90)
GFR calc non Af Amer: >90 mL/min (ref >90)
Glucose, Bld: 113 mg/dL — ABNORMAL HIGH (ref 70–99)
Potassium: 3.9 mEq/L (ref 3.5–5.1)
Potassium: 3.9 mEq/L (ref 3.5–5.1)
Sodium: 140 mEq/L (ref 135–145)

## 2012-12-20 LAB — LIPID PANEL
Cholesterol: 113 mg/dL (ref 0–200)
LDL Cholesterol: 70 mg/dL (ref 0–99)
Total CHOL/HDL Ratio: 2.7 RATIO
Total CHOL/HDL Ratio: 4 RATIO
Triglycerides: 119 mg/dL (ref <150)
VLDL: 22 mg/dL (ref 0–40)

## 2012-12-20 LAB — BLOOD GAS, ARTERIAL
Bicarbonate: 23.9 mEq/L (ref 20.0–24.0)
FIO2: 40 %
Pressure support: 5 cmH2O
pCO2 arterial: 38.7 mmHg (ref 35.0–45.0)
pH, Arterial: 7.407 (ref 7.350–7.450)
pO2, Arterial: 94.3 mmHg (ref 80.0–100.0)

## 2012-12-20 LAB — HEMOGLOBIN A1C: Mean Plasma Glucose: 140 mg/dL — ABNORMAL HIGH (ref <117)

## 2012-12-20 MED ORDER — INSULIN ASPART 100 UNIT/ML ~~LOC~~ SOLN
0.0000 [IU] | SUBCUTANEOUS | Status: DC
  Administered 2012-12-20: 1 [IU] via SUBCUTANEOUS

## 2012-12-20 MED ORDER — ASPIRIN 300 MG RE SUPP
300.0000 mg | Freq: Every day | RECTAL | Status: DC
  Administered 2012-12-20: 300 mg via RECTAL
  Filled 2012-12-20 (×3): qty 1

## 2012-12-20 MED ORDER — SODIUM CHLORIDE 0.9 % IV SOLN
INTRAVENOUS | Status: DC
  Administered 2012-12-21: 09:00:00 via INTRAVENOUS

## 2012-12-20 NOTE — Progress Notes (Signed)
Noted new orders. Patient extubated late this am. Will f/u for eval in am 1/16.   Ferdinand Lango MA, CCC-SLP [phone]

## 2012-12-20 NOTE — Progress Notes (Signed)
  Johnston, Wynonia Medero Brynn   OTR/L Pager: [phone] Office: [phone] .  

## 2012-12-20 NOTE — Consult Note (Signed)
PULMONARY  / CRITICAL CARE MEDICINE  Name: DOCTOR SHEAHAN MRN: [id number] DOB: 08-03-1941    LOS: 1  REFERRING MD:  Stroke Pearlean Brownie)  CHIEF COMPLAINT:  Acute respiratory failure  BRIEF PATIENT DESCRIPTION: 72 yo admitted on 1/14 with acute CVA s/p intraarterial tPA infusion and thrombectomy.  Brought to ICU intubated.  LINES / TUBES: OETT 1/14 >>> OGT 1/14 >>> L rad A-line 1/14 >>> R fem A-line 1/14 >>> Foley 1/14 >>>  CULTURES:  ANTIBIOTICS:  SIGNIFICANT EVENTS:  1/14  Admitted with acute SVA.  Given intraarterial tPA.  Underwent thrombectomy.  Brought to ICU intubated. 1/15 awake, weaning  LEVEL OF CARE:  ICU  PRIMARY SERVICE:  Stroke   CONSULTANTS:  PCCM  CODE STATUS:  Full  DIET:  NPO  DVT Px:  SCDs  GI Px:  Protonix   INTERVAL HISTORY: 1/15 - improved neuro status  VITAL SIGNS: Temp:  [96.8 F (36 C)-98.7 F (37.1 C)] 98.7 F (37.1 C) (01/15 0747) Pulse Rate:  [57-101] 92  (01/15 1000) Resp:  [12-20] 19  (01/15 1000) BP: (101-144)/(51-83) 137/71 mmHg (01/15 1000) SpO2:  [93 %-100 %] 96 % (01/15 1000) Arterial Line BP: (103-145)/(50-77) 131/61 mmHg (01/15 1000) FiO2 (%):  [40 %-100 %] 40 % (01/15 1000) Weight:  [88.4 kg (194 lb 14.2 oz)] 88.4 kg (194 lb 14.2 oz) (01/14 1748) HEMODYNAMICS:    VENTILATOR SETTINGS: Vent Mode:  [-] PSV;CPAP FiO2 (%):  [40 %-100 %] 40 % Set Rate:  [12 bmp-16 bmp] 12 bmp Vt Set:  [600 mL] 600 mL PEEP:  [5 cmH20] 5 cmH20 Pressure Support:  [5 cmH20] 5 cmH20 Plateau Pressure:  [11 cmH20-17 cmH20] 11 cmH20  INTAKE / OUTPUT: Intake/Output      01/14 0701 - 01/15 0700 01/15 0701 - 01/16 0700   I.V. (mL/kg) 1500 (17) 1524.2 (17.2)   Total Intake(mL/kg) 1500 (17) 1524.2 (17.2)   Urine (mL/kg/hr) 1475 (0.7) 125   Total Output 1475 125   Net +25 +1399.2          PHYSICAL EXAMINATION: General:  Mechanically ventilated, synchronous Neuro:  Awake, alert, nonfocal HEENT:  PERRL, OETT / OGT Cardiovascular:  RRR, no  m/r/g Lungs:  Improved bases rt , ronchi uppers Abdomen:  Soft, nontender, bowel sounds diminished Musculoskeletal:  No edema Skin:  Intact  LABS:  Lab 12/20/12 0630 12/20/12 0530 12/19/12 2150 12/19/12 1930 12/19/12 1041 12/19/12 0955 12/19/12 0940  HGB 14.2 -- -- -- 16.3 15.6 --  WBC 11.2* -- -- -- -- -- 7.2  PLT 149* -- -- -- -- -- 181  NA 140 PATIENT IDENTIFICATION ERROR. PLEASE DISREGARD RESULTS. ACCOUNT WILL BE CREDITED. -- -- 141 -- --  K 3.9 3.9 -- -- -- -- --  CL 106 105 -- -- 102 -- --  CO2 24 21 -- -- -- -- 27  GLUCOSE 130* 113* -- -- 114* -- --  BUN 8 15 -- -- 14 -- --  CREATININE 0.67 0.54 -- -- 0.90 -- --  CALCIUM 8.6 8.0* -- -- -- -- 9.3  MG -- -- 1.7 -- -- -- --  PHOS -- -- -- -- -- -- --  AST -- -- -- -- -- -- 21  ALT -- -- -- -- -- -- 19  ALKPHOS -- -- -- -- -- -- 80  BILITOT -- -- -- -- -- -- 0.6  PROT -- -- -- -- -- -- 7.1  ALBUMIN -- -- -- -- -- -- 3.7  APTT -- -- -- -- -- --  33  INR -- -- -- -- -- -- 1.49  LATICACIDVEN -- -- -- -- -- -- --  TROPONINI -- -- -- -- -- -- <0.30  PROCALCITON -- -- -- -- -- -- --  PROBNP -- -- -- -- -- -- --  O2SATVEN -- -- -- -- -- -- --  PHART -- -- -- 7.367 -- -- --  PCO2ART -- -- -- 42.6 -- -- --  PO2ART -- -- -- 471.0* -- -- --    Lab 12/19/12 1537 12/19/12 1006  GLUCAP 122* 125*   IMAGING: 1/15 PCXR >>> Improved rt base atx, ett 6 cm above ( will correct if not extubated) 1/14  Head CT >>> Stable CT appearance of the brain. Continued increased conspicuity of the left MCA M1 segment suggesting thrombus in the setting. No intracranial hemorrhage or mass effect.  ECG:  DIAGNOSES: Principal Problem:  *CVA (cerebral infarction) Active Problems:  DIABETES MELLITUS, TYPE II  HYPERLIPIDEMIA  HYPERTENSION  CORONARY ARTERY DISEASE  CARDIOMYOPATHY, ISCHEMIC  CONGESTIVE HEART FAILURE  PULMONARY EMBOLISM, HX OF  DVT (deep venous thrombosis)  Automatic implantable cardiac defibrillator  medtronic  Warfarin-induced  coagulopathy  TIA (transient ischemic attack)  Atrial fibrillation  Acute respiratory failure  ASSESSMENT / PLAN:  PULMONARY  A:  Acute respiratory failure.  Possible aspiration.  Resolved atx rll,  P:   pcxr improved rt base, if extubated will need aggressiive flutter, IS pcxr follow up Wean cpap 5 ps 5, goal 30 min , assess rsbi May need lasix  CARDIOVASCULAR  A:  H/o arrhythmia, s/p pacemaker AICD placement - per RN reported frequent firing weeks prior to admission, missed Cardiology appointment.  CHF without exacerbation.  CAD.  Dyslipidemia. P:  Goal SBP < 180 and DBP < 105 Cardiology consult in AM ([name]), will call Would use cardizem if needed Lipitor, Coreg, Vasotec, Lanoxin when able, eval swallow post extubation  RENAL  A:  Normal renal function. P:   NS@75  to reduce Trend BMP in am  May need lasix  GASTROINTESTINAL  A:  No active issues P:   NPO as intubated TF if remains intubated > 24 hours  HEMATOLOGIC  A:  No active issues. P:  Trend CBC Anticoagulation when safe per neuro  INFECTIOUS  A:  Possible aspiration but no evidence of infection. P:   Defer abx for now, however re eval rt base  ENDOCRINE   A:  Normoglycemia. P:   Add cbg  NEUROLOGIC  A:  Acute CVA s/p intraarterial tPA and thrombectomy. P:   Goal RASS 0 to -1 Propofol gtt for synchrony Per Neurology  CLINICAL SUMMARY:  72 yo admitted on 1/14 with acute CVA s/p intraarterial tPA infusion and thrombectomy.  Brought to ICU intubated. Possible intubation in AM.  Wean to extubate, kvo fluids, call EP  I have personally obtained a history, examined the patient, evaluated laboratory and imaging results, formulated the assessment and plan and placed orders.  CRITICAL CARE:  The patient is critically ill with multiple organ systems failure and requires high complexity decision making for assessment and support, frequent evaluation and titration of therapies, application of  advanced monitoring technologies and extensive interpretation of multiple databases. Critical Care Time devoted to patient care services described in this note is 30 minutes.   Nelda Bucks., MD  Pulmonary and Critical Care Medicine [facility] Pager: [phone]  12/20/2012, 10:20 AM

## 2012-12-20 NOTE — Progress Notes (Signed)
Subjective: CVA IA tpa and clot retrieval 1/14  L Internal Carotid Artrery Pt on vent Follows simple commands Plan for extubate today probable  Objective: Vital signs in last 24 hours: Temp:  [96.8 F (36 C)-98.7 F (37.1 C)] 98.7 F (37.1 C) (01/15 0747) Pulse Rate:  [57-101] 86  (01/15 0800) Resp:  [12-18] 17  (01/15 0800) BP: (101-140)/(51-83) 125/65 mmHg (01/15 0800) SpO2:  [94 %-100 %] 100 % (01/15 0800) Arterial Line BP: (103-145)/(50-77) 145/75 mmHg (01/15 0800) FiO2 (%):  [40 %-100 %] 40 % (01/15 0800) Weight:  [194 lb 14.2 oz (88.4 kg)-200 lb (90.719 kg)] 194 lb 14.2 oz (88.4 kg) (01/14 1748)    Intake/Output from previous day: 01/14 0701 - 01/15 0700 In: 1500 [I.V.:1500] Out: 1475 [Urine:1475] Intake/Output this shift: Total I/O In: 1374.2 [I.V.:1374.2] Out: -   PE:  Afeb; vss On Vent Rt groin no bleeding; NT No hematoma 2+ pulses Rt foot Opens eyes to command Squeezes hands = B Pushes and pulls feet = B    Lab Results:   Basename 12/20/12 0630 12/19/12 1041 12/19/12 0940  WBC 11.2* -- 7.2  HGB 14.2 16.3 --  HCT 41.3 48.0 --  PLT 149* -- 181   BMET  Basename 12/20/12 0630 12/20/12 0530  NA 140 PATIENT IDENTIFICATION ERROR. PLEASE DISREGARD RESULTS. ACCOUNT WILL BE CREDITED.  K 3.9 3.9  CL 106 105  CO2 24 21  GLUCOSE 130* 113*  BUN 8 15  CREATININE 0.67 0.54  CALCIUM 8.6 8.0*   PT/INR  Basename 12/19/12 0940  LABPROT 17.6*  INR 1.49   ABG  Basename 12/19/12 1930  PHART 7.367  HCO3 24.1*    Studies/Results: Ct Head Wo Contrast  12/20/2012  *RADIOLOGY REPORT*  Clinical Data: Follow-up t-PA.  CT HEAD WITHOUT CONTRAST  Technique:  Contiguous axial images were obtained from the base of the skull through the vertex without contrast.  Comparison: 12/19/2012  Findings: Developing area of low attenuation in the left basal ganglia predominately involving the caudate the and internal capsule region.  This is consistent with evolving  infarct.  No significant mass effect or midline shift.  Mild diffuse cerebral atrophy.  No ventricular dilatation.  Gray-white matter junctions are distinct.  Basal cisterns are not effaced.  No evidence of acute intracranial hemorrhage.  IMPRESSION: Low attenuation change in the left internal capsule and basal ganglia consistent with evolving acute infarct.  No acute intracranial hemorrhage.   Original Report Authenticated By: Burman Nieves, M.D.    Ct Head Wo Contrast  12/19/2012  *RADIOLOGY REPORT*  Clinical Data: 72 year old male status post Neurointervention for Code stroke.  CT HEAD WITHOUT CONTRAST  Technique:  Contiguous axial images were obtained from the base of the skull through the vertex without contrast.  Comparison: 1230 hours the same day and earlier.  Findings: The patient is intubated.  Stable paranasal sinuses and mastoids.  Fluid in the pharynx. Visualized orbits and scalp soft tissues are within normal limits.  No acute osseous abnormality identified.  There is still evidence of intravascular contrast.  Asymmetric conspicuity of the left MCA M1 segment persists.  No ventriculomegaly.  No midline shift or mass effect.  Basilar cisterns remain patent.  Gray-white matter differentiation throughout the brain appears stable.  No acute cortically based infarct is evident. No acute intracranial hemorrhage identified.  IMPRESSION: Stable CT appearance of the brain. Continued increased conspicuity of the left MCA M1 segment suggesting thrombus in the setting.  No intracranial hemorrhage or mass effect.  Original Report Authenticated By: Erskine Speed, M.D.    Ct Head Wo Contrast  12/19/2012  *RADIOLOGY REPORT*  Clinical Data: Code stroke.  Right facial droop, slurred speech.  CT HEAD WITHOUT CONTRAST  Technique:  Contiguous axial images were obtained from the base of the skull through the vertex without contrast.  Comparison: 12/19/2012  Findings: The left middle cerebral artery appears dense.   Cannot exclude MCA thrombus.  No changes within the left cerebral hemisphere of acute infarction currently.  No hemorrhage.  No hydrocephalus or midline shift.  IMPRESSION: Question increased density of the left middle cerebral artery suggesting thrombosis.   Original Report Authenticated By: Charlett Nose, M.D.    Ct Head Wo Contrast  12/19/2012  *RADIOLOGY REPORT*  Clinical Data: Right facial droop, aphasia  CT HEAD WITHOUT CONTRAST  Technique:  Contiguous axial images were obtained from the base of the skull through the vertex without contrast.  Comparison: None.  Findings: No evidence of parenchymal hemorrhage or extra-axial fluid collection. No mass lesion, mass effect, or midline shift.  No CT evidence of acute infarction.  Cerebral volume is age appropriate.  No ventriculomegaly.  Very mild partial opacification of the bilateral maxillary sinuses. The mastoid air cells are clear.  No evidence of calvarial fracture.  IMPRESSION: No evidence of acute intracranial abnormality.  These results were called by telephone on 12/19/2012 at 1000 hours to Dr. Noel Christmas, who verbally acknowledged these results.   Original Report Authenticated By: Charline Bills, M.D.    Portable Chest Xray In Am  12/20/2012  *RADIOLOGY REPORT*  Clinical Data: Endotracheal tube evaluation  PORTABLE CHEST - 1 VIEW  Comparison: Chest radiograph 12/19/2012  Findings: Endotracheal tube and NG tube are unchanged.  Stable cardiac silhouette.  There is air space disease the right lower lobe not significantly changed.  No pneumothorax.  Right-sided pacemaker noted.  IMPRESSION:  1.  Stable support apparatus. 2.  Right lower lobe air space disease.   Original Report Authenticated By: Genevive Bi, M.D.    Portable Chest Xray  12/19/2012  *RADIOLOGY REPORT*  Clinical Data: Intubation, acute stroke symptoms  PORTABLE CHEST - 1 VIEW  Comparison: 08/20/2009  Findings: Endotracheal tube 6 cm above the carina.  NG tube enters the  proximal stomach.  Right subclavian AICD noted.  Stable borderline cardiac enlargement with central vascular congestion. Right lower lung airspace disease noted compatible with pneumonia or aspiration.  Left lung remains clear.  No effusion or pneumothorax.  IMPRESSION: Endotracheal tube 6 cm above the carina.  Right lower lung pneumonia versus aspiration   Original Report Authenticated By: Judie Petit. Miles Costain, M.D.     Anti-infectives: Anti-infectives     Start     Dose/Rate Route Frequency Ordered Stop   12/19/12 1345   vancomycin (VANCOCIN) powder 1,000 mg  Status:  Discontinued        1,000 mg Other  Once 12/19/12 1335 12/19/12 1337   12/19/12 1345   vancomycin (VANCOCIN) IVPB 1000 mg/200 mL premix        1,000 mg 200 mL/hr over 60 Minutes Intravenous  Once 12/19/12 1337 12/19/12 1345          Assessment/Plan: s/p  CVA 1/14 L ICA IA tpa and clot retrieval in IR with Dr Corliss Skains Pt following some commands B extr with = movement Pull sheath today   LOS: 1 day    Connar Keating A 12/20/2012

## 2012-12-20 NOTE — Progress Notes (Signed)
SLP Cancellation Note  Patient Details Name: Carl Wood MRN: [id number] DOB: 1941/09/13   Cancelled treatment:       Reason Eval/Treat Not Completed: Medical issues which prohibited therapy (Pt. intubated. Signing off. Reconsult when appropriate. )  Ferdinand Lango MA, CCC-SLP [phone]  [name] [name] 12/20/2012, 11:00 AM

## 2012-12-20 NOTE — Progress Notes (Addendum)
Stroke Team Progress Note  HISTORY Carl Wood is an 72 y.o. male history of pulmonary embolism, atrial fibrillation, on Coumadin, congestive heart failure, diabetes mellitus, hypertension and hyperlipidemia, presenting with onset of speech difficulty and right-sided weakness at 8:45 AM today 12/19/2012. Has no previous history of stroke nor TIA. Patient has been on Coumadin long-term Coumadin suspended 2 weeks ago for colonoscopy. Coumadin was restarted on 12/11/2012. INR today was 1.49. Initial CT scan showed no acute intracranial abnormality. Initial NIH stroke score was 5. Patient improved in the emergency room to a stroke score of 1. Thrombolytic therapy with IV TPA was not elected because of the minimal residual neurologic deficits. Shortly before noon patient had a relapse of deficits with expressive aphasia and right hemiplegia. Repeat CT scan showed hypodensity involving the left MCA indicative of intravascular thrombus. Patient was taken to interventional radiology. Cerebral angiography demonstrated thrombosis involving the left internal carotid artery just above the common carotid artery bifurcation and extending into the middle cerebral and anterior cerebral artery branches. Patient subsequently underwent intra-arterial TPA infusion followed by thrombectomy. MCA territory circulation was restored and all branches. Patient had good collateral filling of the left ACA via the anterior communicating artery. He was subsequently admitted to the neuro intensive care unit for further management. He was admitted to the neuro ICU for further evaluation and treatment.  SUBJECTIVE His wife and daughters are at the bedside.  Overall he feels his condition is gradually improving. He is having some jerking of his left arm, ? Seizures.  OBJECTIVE Most recent Vital Signs: Filed Vitals:   12/20/12 0500 12/20/12 0600 12/20/12 0747 12/20/12 0750  BP: 101/73 107/59  128/62  Pulse: 87 87  101  Temp:   98.7  F (37.1 C)   TempSrc:   Axillary   Resp: 18 16  16   Height:      Weight:      SpO2: 100% 99%  100%   CBG (last 3)   Basename 12/19/12 1537 12/19/12 1006  GLUCAP 122* 125*   IV Fluid Intake:     . sodium chloride    . sodium chloride 75 mL/hr at 12/20/12 0600  . niCARDipine    . propofol 50 mcg/kg/min (12/20/12 0600)   MEDICATIONS    . antiseptic oral rinse  15 mL Mouth Rinse QID  . chlorhexidine  15 mL Mouth Rinse BID  . nitroGLYCERIN 1.5mg /44ml(25 mcg/ml) - [facility]IR  1.5 mg Intra-arterial to XRAY  . pantoprazole (PROTONIX) IV  40 mg Intravenous QHS   PRN:  acetaminophen, acetaminophen, acetaminophen, acetaminophen, albuterol, fentaNYL, labetalol, midazolam, midazolam, ondansetron (ZOFRAN) IV, ondansetron (ZOFRAN) IV  Diet:  NPO  Activity:  Bedrest DVT Prophylaxis:  SCDs   CLINICALLY SIGNIFICANT STUDIES Basic Metabolic Panel:  Lab 12/20/12 7829 12/20/12 0530 12/19/12 2150  NA 140 PATIENT IDENTIFICATION ERROR. PLEASE DISREGARD RESULTS. ACCOUNT WILL BE CREDITED. --  K 3.9 3.9 --  CL 106 105 --  CO2 24 21 --  GLUCOSE 130* 113* --  BUN 8 15 --  CREATININE 0.67 0.54 --  CALCIUM 8.6 8.0* --  MG -- -- 1.7  PHOS -- -- --   Liver Function Tests:  Lab 12/19/12 0940  AST 21  ALT 19  ALKPHOS 80  BILITOT 0.6  PROT 7.1  ALBUMIN 3.7   CBC:  Lab 12/20/12 0630 12/19/12 1041 12/19/12 0940  WBC 11.2* -- 7.2  NEUTROABS 9.4* -- 4.7  HGB 14.2 16.3 --  HCT 41.3 48.0 --  MCV 96.9 --  97.4  PLT 149* -- 181   Coagulation:  Lab 12/19/12 0940  LABPROT 17.6*  INR 1.49   Cardiac Enzymes:  Lab 12/19/12 0940  CKTOTAL --  CKMB --  CKMBINDEX --  TROPONINI <0.30   Urinalysis: No results found for this basename: COLORURINE:2,APPERANCEUR:2,LABSPEC:2,PHURINE:2,GLUCOSEU:2,HGBUR:2,BILIRUBINUR:2,KETONESUR:2,PROTEINUR:2,UROBILINOGEN:2,NITRITE:2,LEUKOCYTESUR:2 in the last 168 hours Lipid Panel    Component Value Date/Time   CHOL 113 12/20/2012 0630   TRIG 119 12/20/2012 0630   HDL  28* 12/20/2012 0630   CHOLHDL 4.0 12/20/2012 0630   VLDL 24 12/20/2012 0630   LDLCALC 61 12/20/2012 0630   HgbA1C  Lab Results  Component Value Date   HGBA1C 6.5 10/27/2012   Urine Drug Screen:   No results found for this basename: labopia, cocainscrnur, labbenz, amphetmu, thcu, labbarb    Alcohol Level: No results found for this basename: ETH:2 in the last 168 hours  CT of the brain   12/20/2012   Low attenuation change in the left internal capsule and basal ganglia consistent with evolving acute infarct.  No acute intracranial hemorrhage.    12/19/2012 Stable CT appearance of the brain. Continued increased conspicuity of the left MCA M1 segment suggesting thrombus in the setting.  No intracranial hemorrhage or mass effect.   12/19/2012  Question increased density of the left middle cerebral artery suggesting thrombosis.    12/19/2012  No evidence of acute intracranial abnormality  Cerebral Angiogram  12/19/2012 S/P bilateral common carotid arteriogram,followed by complete revascularization of T occlusion of the LT ICA terminus.,using 7.4 mg of IA TPA and the trevoprovue retrieval device.  MRI of the brain  defibrillator  MRA of the brain  See angio  2D Echocardiogram    Carotid Doppler  See angio  CXR   12/20/2012   1.  Stable support apparatus. 2.  Right lower lobe air space disease.   12/19/2012  Endotracheal tube 6 cm above the carina.  Right lower lung pneumonia versus aspiration     EKG  .   Therapy Recommendations   Physical Exam    Middle aged caucasian male intubated.Awake alert. Afebrile. Head is nontraumatic. Neck is supple without bruit. . Cardiac exam no murmur or gallop. Lungs are clear to auscultation. Distal pulses are well felt.  Neurological Exam : Awake alert follows commands well. Eye movements full. Mild rt lower face weak. Moves rt side against gravity. Mild rt hemiparesis. Good left sided weakness.plantars downgoing. ASSESSMENT Carl Wood is a 72  y.o. male presenting with right sided weakness and speech difficulty. He did not receive IV tpa due to rapid improvement. Neurologic worsening in ED led to cerebral angiogram finding of left ICA occlusion. Status post revascularization with IA tPA and the trevo device. Imaging confirms a relatively small left basal ganglia infarct considering he had a ICA occlusion. Infarct felt to be embolic secondary to left ICA occlusion associated with atrial fibrillation.  Work up underway. On warfarin prior to admission for atrial fibrillation, INR subtherapeutic at 1.49. Now on no antiplatelets given IA tPA within 24h for secondary stroke prevention. Patient with resultant right hemiparesis, VDRF.   VDRF following neuro intervention  Left arm twitch, not felt to be seizure atrial fibrillation Diabetes, HgbA1c pending Hyperlipidemia, LDL 61, on lipitor 80 PTA, at goal LDL < 100 Hypertension Cardiac defibrillator CAD - MI CHF Hx PE  Hospital day # 1  TREATMENT/PLAN  Add aspirin 300 mg suppository daily for secondary stroke prevention. Plan change to xarelto once able to take pos  Wean to  extubation  Check swallow when stable  Ct head in am  Ask [facility]ology to assess debrillator (office check scheduled for today)  D/w wife, daughter and Dr Vassie Loll, MSN, RN, ANVP-BC, ANP-BC, GNP-BC [facility] Pager: [phone] 12/20/2012 8:29 AM This patient is critically ill and at significant risk of neurological worsening, death and care requires constant monitoring of vital signs, hemodynamics,respiratory and cardiac monitoring,review of multiple databases, neurological assessment, discussion with family, other specialists and medical decision making of high complexity. I spent 30 minutes of neurocritical care time  in the care of  this patient.  I have personally obtained a history, examined the patient, evaluated imaging results, and formulated the assessment and  plan of care. I agree with the above. Delia Heady, MD Medical Director [facility] Pager: [phone] 12/20/2012 10:02 AM

## 2012-12-20 NOTE — Progress Notes (Signed)
PT/OT Cancellation Note  Patient Details Name: DUANNE DUCHESNE MRN: [id number] DOB: Aug 10, 1941   Cancelled Treatment:    Reason Eval/Treat Not Completed: Patient not medically ready (Pt intubated at this time. )Will continue to follow.     Maze Corniel 12/20/2012, 8:02 AM Jake Shark, PT DPT [phone]

## 2012-12-20 NOTE — Consult Note (Signed)
ELECTROPHYSIOLOGY CONSULT NOTE  Patient ID: BRITTEN SEYFRIED MRN: [id number], DOB/AGE: 05/05/1941   Admit date: 12/19/2012 Date of Consult: 12/20/2012  Primary Physician: Birdie Sons, MD Primary Cardiologist: Berton Mount, MD Reason for Consultation: Atrial fibrillation, ? ICD malfunction  History of Present Illness Mr. Piercefield is a 72 year old gentleman with an ischemic CM, paroxysmal VT s/p single chamber ICD (Medtronic), prior PE, chronic systolic HF, PAF, CAD s/p CABG, HTN and DM who was admitted yesterday with LE weakness, facial droop and dysphasia, found to have an acute left MCA CVA. His wife and daughter are at the bedside and assist with history questions. Mr. Russom recently underwent a screening colonoscopy for which his Coumadin was held x 5 days. After his procedure on 12/11/2012 his Coumadin was restarted. Unfortunately, on admission he was found to have a subtherapeutic INR. He is in atrial fibrillation. His neuro symptoms were rapidly improving in the ED yesterday AM therefore tPA was not given at that time. However, around noon yesterday he developed increasing lethargy with right-sided weakness and facial droop. At that time he was taken to Interventional Radiology where he underwent intra-arterial tPA infusion and thrombectomy. MCA territory circulation was restored in all branches. He is currently intubated but awake. He appears comfortable and is hemodynamically stable.   Of note, Mr. Faulkenberry recently lost his 70 year old son who died suddenly on 26-Nov-2012 which of course has been quite stressful for their family. He was last seen by [name] in March 2013. He was stable from a cardiac/EP standpoint at that time. He had a device clinic visit on 11/30/2012 at which time it was noted he had 83 NSVT episodes. Mrs. Blanke states she and her husband thought his ICD might be malfunctioning and they were scheduled for follow-up in the device clinic yesterday.   Past Medical  History Past Medical History  Diagnosis Date  . PE (pulmonary embolism)   . Colon polyps   . CHF (congestive heart failure)   . CAD (coronary artery disease)   . Diabetes mellitus   . Diverticulosis of colon   . Hyperlipidemia   . Hypertension   . MI (mitral incompetence)   . Nephrolithiasis   . V-tach     Past Surgical History Past Surgical History  Procedure Date  . Back surgery   . Cardiac defibrillator placement     medtronic virtuoso  . Basal cell carcinoma excision     nose  . Knee arthroplasty   . Cholecystectomy   . Colonoscopy 12/11/2012    Procedure: COLONOSCOPY;  Surgeon: Louis Meckel, MD;  Location: [facility];  Service: Endoscopy;  Laterality: N/A;     Allergies/Intolerances Allergies  Allergen Reactions  . Penicillins     REACTION: unspecified  . Sulfamethoxazole     REACTION: unspecified    Inpatient Medications    . antiseptic oral rinse  15 mL Mouth Rinse QID  . aspirin  300 mg Rectal Daily  . chlorhexidine  15 mL Mouth Rinse BID  . insulin aspart  0-9 Units Subcutaneous Q4H  . nitroGLYCERIN 1.5mg /35ml(25 mcg/ml) - [facility]-IR  1.5 mg Intra-arterial to XRAY  . pantoprazole (PROTONIX) IV  40 mg Intravenous QHS      . sodium chloride 20 mL/hr at 12/20/12 1100  . niCARDipine    . propofol Stopped (12/20/12 0800)    Family History Family History  Problem Relation Age of Onset  . Colon cancer Father   . Diabetes Father   .  Heart disease Father   . Bladder Cancer Mother   . Heart disease Mother      Social History Social History  . Marital Status: Married   Occupational History  . Retired    Social History Main Topics  . Smoking status: Current Every Day Smoker -- 0.5 packs/day    Types: Cigarettes  . Smokeless tobacco: Never Used  . Alcohol Use: No  . Drug Use: No   Review of Systems  Unable to obtain from patient  Physical Exam Blood pressure 144/60, pulse 99, temperature 98.7 F (37.1 C), temperature source Axillary, resp.  rate 21, height 5\' 8"  (1.727 m), weight 194 lb 14.2 oz (88.4 kg), SpO2 97.00%.  General: Well developed 72 year old male who is intubated but awake and responsive to verbal stimuli. He appears comfortable.  HEENT: Normocephalic, atraumatic. EOMs intact. Sclera nonicteric. ET tube in place. Neck: Supple. No JVD. Lungs: Respirations regular and unlabored, CTA bilaterally. No wheezes, rales or rhonchi. Heart: Irregularly irregular. S1, S2 present. No murmurs, rub, S3 or S4. Abdomen: Soft, non-distended. BS present x 4 quadrants. Extremities: No clubbing, cyanosis or edema. DP/PT/Radials 2+ and equal bilaterally. Neuro: Alert and responsive to verbal stimuli. Musculoskeletal: No kyphosis. Skin: Intact. Warm and dry. No rashes or petechiae in exposed areas.   Labs  [facility] 12/19/12 0940  CKTOTAL --  CKMB --  TROPONINI <0.30   Lab Results  Component Value Date   WBC 11.2* 12/20/2012   HGB 14.2 12/20/2012   HCT 41.3 12/20/2012   MCV 96.9 12/20/2012   PLT 149* 12/20/2012    Lab 12/20/12 0630 12/19/12 0940  NA 140 --  K 3.9 --  CL 106 --  CO2 24 --  BUN 8 --  CREATININE 0.67 --  CALCIUM 8.6 --  PROT -- 7.1  BILITOT -- 0.6  ALKPHOS -- 80  ALT -- 19  AST -- 21  GLUCOSE 130* --   Lab Results  Component Value Date   CHOL 113 12/20/2012   HDL 28* 12/20/2012   LDLCALC 61 12/20/2012   TRIG 119 12/20/2012    Basename 12/19/12 0940  INR 1.49    Radiology/Studies Ct Head Wo Contrast  12/20/2012  *RADIOLOGY REPORT*  Clinical Data: Follow-up t-PA.  CT HEAD WITHOUT CONTRAST  Technique:  Contiguous axial images were obtained from the base of the skull through the vertex without contrast.  Comparison: 12/19/2012  Findings: Developing area of low attenuation in the left basal ganglia predominately involving the caudate the and internal capsule region.  This is consistent with evolving infarct.  No significant mass effect or midline shift.  Mild diffuse cerebral atrophy.  No ventricular  dilatation.  Gray-white matter junctions are distinct.  Basal cisterns are not effaced.  No evidence of acute intracranial hemorrhage.  IMPRESSION: Low attenuation change in the left internal capsule and basal ganglia consistent with evolving acute infarct.  No acute intracranial hemorrhage.   Original Report Authenticated By: Burman Nieves, M.D.    Ct Head Wo Contrast  12/19/2012  *RADIOLOGY REPORT*  Clinical Data: 72 year old male status post Neurointervention for Code stroke.  CT HEAD WITHOUT CONTRAST  Technique:  Contiguous axial images were obtained from the base of the skull through the vertex without contrast.  Comparison: 1230 hours the same day and earlier.  Findings: The patient is intubated.  Stable paranasal sinuses and mastoids.  Fluid in the pharynx. Visualized orbits and scalp soft tissues are within normal limits.  No acute osseous abnormality identified.  There is still evidence of intravascular contrast.  Asymmetric conspicuity of the left MCA M1 segment persists.  No ventriculomegaly.  No midline shift or mass effect.  Basilar cisterns remain patent.  Gray-white matter differentiation throughout the brain appears stable.  No acute cortically based infarct is evident. No acute intracranial hemorrhage identified.  IMPRESSION: Stable CT appearance of the brain. Continued increased conspicuity of the left MCA M1 segment suggesting thrombus in the setting.  No intracranial hemorrhage or mass effect.   Original Report Authenticated By: Erskine Speed, M.D.    Ct Head Wo Contrast  12/19/2012  *RADIOLOGY REPORT*  Clinical Data: Code stroke.  Right facial droop, slurred speech.  CT HEAD WITHOUT CONTRAST  Technique:  Contiguous axial images were obtained from the base of the skull through the vertex without contrast.  Comparison: 12/19/2012  Findings: The left middle cerebral artery appears dense.  Cannot exclude MCA thrombus.  No changes within the left cerebral hemisphere of acute infarction  currently.  No hemorrhage.  No hydrocephalus or midline shift.  IMPRESSION: Question increased density of the left middle cerebral artery suggesting thrombosis.   Original Report Authenticated By: Charlett Nose, M.D.    Ct Head Wo Contrast  12/19/2012  *RADIOLOGY REPORT*  Clinical Data: Right facial droop, aphasia  CT HEAD WITHOUT CONTRAST  Technique:  Contiguous axial images were obtained from the base of the skull through the vertex without contrast.  Comparison: None.  Findings: No evidence of parenchymal hemorrhage or extra-axial fluid collection. No mass lesion, mass effect, or midline shift.  No CT evidence of acute infarction.  Cerebral volume is age appropriate.  No ventriculomegaly.  Very mild partial opacification of the bilateral maxillary sinuses. The mastoid air cells are clear.  No evidence of calvarial fracture.  IMPRESSION: No evidence of acute intracranial abnormality.  These results were called by telephone on 12/19/2012 at 1000 hours to Dr. Noel Christmas, who verbally acknowledged these results.   Original Report Authenticated By: Charline Bills, M.D.    Portable Chest Xray In Am  12/20/2012  *RADIOLOGY REPORT*  Clinical Data: Endotracheal tube evaluation  PORTABLE CHEST - 1 VIEW  Comparison: Chest radiograph 12/19/2012  Findings: Endotracheal tube and NG tube are unchanged.  Stable cardiac silhouette.  There is air space disease the right lower lobe not significantly changed.  No pneumothorax.  Right-sided pacemaker noted.  IMPRESSION:  1.  Stable support apparatus. 2.  Right lower lobe air space disease.   Original Report Authenticated By: Genevive Bi, M.D.     12-lead ECG - none for review from this admission Telemetry - atrial fibrillation with occasional PVCs/ventricular couplets  Device interrogation today shows normal single chamber ICD function with good battery status and stable lead measurement/parameters; no VT/VF episodes since last interrogation on 11/30/2012;  histograms appropriate; no programming changes made   Assessment and Plan 1. Acute left MCA CVA Per Neuro/Stroke team 2. Atrial fibrillation Currently rate controlled and would keep HR <110 bpm When okay from neuro standpoint, would add back carvedilol and digoxin per home dose and schedule with close watch over BP and HR When okay from neuro standpoint, would start back on chronic anticoagulation ([name] discussed this directly with Dr. Pearlean Brownie who stated he planned to start Xarelto once patient able to take PO meds)  3. Subtherapeutic INR 4. Ischemic CM with chronic systolic HF Resume medical therapy with carvedilol and ACEI when able 5. Paroxysmal VT s/p ICD Stable; no VT/VF episodes by device interrogation today As outlined  above, resume BB when able   Signed, Rick Duff, PA-C 12/20/2012, 12:33 PM

## 2012-12-20 NOTE — Progress Notes (Signed)
  Echocardiogram 2D Echocardiogram has been performed.  Carl Wood 12/20/2012, 10:00 AM

## 2012-12-20 NOTE — Procedures (Signed)
Extubation Procedure Note  Patient Details:   Name: Carl Wood DOB: 07-19-1941 MRN: [id number]   Evaluation  O2 sats: stable throughout Complications: No apparent complications Patient did tolerate procedure well. Bilateral Breath Sounds: Clear;Diminished Suctioning: Airway Yes  [name]  Cuff leak detected.  Pt has good cough and able to vocalize.  VS stable.  Will continue to monitor.    Genia Del Apple 12/20/2012, 11:30 AM

## 2012-12-20 NOTE — Progress Notes (Signed)
28f sheath removed 1140am no complications vpad applied

## 2012-12-20 NOTE — Progress Notes (Signed)
UR completed 

## 2012-12-21 ENCOUNTER — Inpatient Hospital Stay (HOSPITAL_COMMUNITY): Payer: Medicare Other

## 2012-12-21 ENCOUNTER — Encounter (HOSPITAL_COMMUNITY): Payer: Self-pay | Admitting: Radiology

## 2012-12-21 DIAGNOSIS — I4891 Unspecified atrial fibrillation: Secondary | ICD-10-CM

## 2012-12-21 DIAGNOSIS — I4729 Other ventricular tachycardia: Secondary | ICD-10-CM

## 2012-12-21 DIAGNOSIS — I6350 Cerebral infarction due to unspecified occlusion or stenosis of unspecified cerebral artery: Secondary | ICD-10-CM

## 2012-12-21 DIAGNOSIS — I509 Heart failure, unspecified: Secondary | ICD-10-CM

## 2012-12-21 DIAGNOSIS — I472 Ventricular tachycardia: Secondary | ICD-10-CM

## 2012-12-21 LAB — CBC WITH DIFFERENTIAL/PLATELET
Basophils Absolute: 0 10*3/uL (ref 0.0–0.1)
Basophils Relative: 0 % (ref 0–1)
Eosinophils Absolute: 0.1 10*3/uL (ref 0.0–0.7)
Eosinophils Relative: 1 % (ref 0–5)
HCT: 36.9 % — ABNORMAL LOW (ref 39.0–52.0)
Lymphocytes Relative: 13 % (ref 12–46)
MCH: 32.9 pg (ref 26.0–34.0)
MCHC: 34.1 g/dL (ref 30.0–36.0)
MCV: 96.3 fL (ref 78.0–100.0)
Monocytes Absolute: 0.9 10*3/uL (ref 0.1–1.0)
RDW: 12.8 % (ref 11.5–15.5)

## 2012-12-21 LAB — GLUCOSE, CAPILLARY
Glucose-Capillary: 83 mg/dL (ref 70–99)
Glucose-Capillary: 186 mg/dL — ABNORMAL HIGH (ref 70–99)
Glucose-Capillary: 137 mg/dL — ABNORMAL HIGH (ref 70–99)

## 2012-12-21 LAB — BASIC METABOLIC PANEL
CO2: 25 mEq/L (ref 19–32)
Calcium: 8.1 mg/dL — ABNORMAL LOW (ref 8.4–10.5)
Creatinine, Ser: 0.67 mg/dL (ref 0.50–1.35)

## 2012-12-21 MED ORDER — ENALAPRIL MALEATE 10 MG PO TABS
10.0000 mg | ORAL_TABLET | Freq: Two times a day (BID) | ORAL | Status: DC
  Administered 2012-12-21 – 2012-12-25 (×5): 10 mg via ORAL
  Filled 2012-12-21 (×11): qty 1

## 2012-12-21 MED ORDER — RIVAROXABAN 20 MG PO TABS
20.0000 mg | ORAL_TABLET | Freq: Every day | ORAL | Status: DC
  Administered 2012-12-21 – 2012-12-22 (×2): 20 mg via ORAL
  Filled 2012-12-21 (×3): qty 1

## 2012-12-21 MED ORDER — CARVEDILOL 6.25 MG PO TABS
6.2500 mg | ORAL_TABLET | Freq: Two times a day (BID) | ORAL | Status: DC
  Administered 2012-12-21 – 2012-12-25 (×6): 6.25 mg via ORAL
  Filled 2012-12-21 (×11): qty 1

## 2012-12-21 MED ORDER — ATORVASTATIN CALCIUM 80 MG PO TABS
80.0000 mg | ORAL_TABLET | Freq: Every day | ORAL | Status: DC
  Administered 2012-12-21 – 2012-12-25 (×3): 80 mg via ORAL
  Filled 2012-12-21 (×5): qty 1

## 2012-12-21 MED ORDER — SODIUM CHLORIDE 0.9 % IV SOLN
INTRAVENOUS | Status: DC
  Administered 2012-12-21 – 2012-12-25 (×2): via INTRAVENOUS

## 2012-12-21 NOTE — Evaluation (Signed)
Occupational Therapy Evaluation Patient Details Name: Carl Wood MRN: [id number] DOB: 08-05-1941 Today's Date: 12/21/2012 Time: 4098-1191 OT Time Calculation (min): 22 min  OT Assessment / Plan / Recommendation Clinical Impression  72 yo male admitted with Rt sided weakness and speech difficulty. Pt with hx of PE, AFIB, CHF, DM, HTN found to have INR 1.49 after stopping coumdin x2 weeks ago prior to colonscopy. Pt with NIH of 5 and then in ED found to have NIH =1. Pt relapsed in ED with deficits of expressive aphasia and rt hemiplegia. Ct reveals Lt MCA hyodense intravascular thombus. Pt underwent cerebral aniography of thrombus in lt internal carotid artery above common carotid artery bifurcation and extending into middle cerebral & anterior artery branches. Pt intubated 12/19/12-12/20/12 pt provided intra-arterial TPA infusion . Pt with sheath and sheath removed 12/20/12 in Rt LE. Pt with small basal ganglia infarct considering ICA occlusion. Neuro notes a Lt Ue twitch however not observed during evaluation. OT to follow acutely. Recommend HHOT for d/c planning    OT Assessment  Patient needs continued OT Services    Follow Up Recommendations  Home health OT    Barriers to Discharge      Equipment Recommendations  3 in 1 bedside comode    Recommendations for Other Services    Frequency  Min 2X/week    Precautions / Restrictions Precautions Precautions: Fall   Pertinent Vitals/Pain Headache     ADL  Eating/Feeding: Set up Where Assessed - Eating/Feeding: Chair Grooming: Wash/dry face;Set up (use tissue to blow nose) Where Assessed - Grooming: Supported sitting Toilet Transfer: Min Pension scheme manager Method: Sit to Barista: Regular height toilet Equipment Used: Gait belt Transfers/Ambulation Related to ADLs: pt ambulated ~3-4 steps with stand pivot to chair. pt with good recognition of lines and leads. further balance assessment to be  completed ADL Comments: pt completed bed mobility supine<>sit with Mod v/c for sequence. pt required (A) side<>sit position. pt sitting EOB with good sitting balance. pt with Lt UE edema and IV located on Lt hand. pt with edema noted in hand and forearm. RN tammy notified. pt with wedding ring on Lt hand and edema noted ring is becoming tight. Wife and granddaughter report not noticing edema 12/20/12. Pt with decreased AROM of Lt hand compared to Rt UE. Pt with decreased elbow flexion compared to Rt UE    OT Diagnosis: Generalized weakness  OT Problem List: Decreased strength;Decreased activity tolerance;Impaired balance (sitting and/or standing);Decreased safety awareness;Decreased knowledge of use of DME or AE;Decreased knowledge of precautions OT Treatment Interventions: Self-care/ADL training;Neuromuscular education;DME and/or AE instruction;Therapeutic activities;Patient/family education;Balance training   OT Goals Acute Rehab OT Goals OT Goal Formulation: With patient Time For Goal Achievement: 01/04/13 Potential to Achieve Goals: Good ADL Goals Pt Will Perform Upper Body Bathing: with modified independence;Standing at sink ADL Goal: Upper Body Bathing - Progress: Goal set today Pt Will Perform Lower Body Bathing: with modified independence;Standing at sink ADL Goal: Lower Body Bathing - Progress: Goal set today Pt Will Perform Upper Body Dressing: with modified independence;Unsupported;Sit to stand from chair ADL Goal: Upper Body Dressing - Progress: Goal set today Pt Will Perform Lower Body Dressing: Sit to stand from chair;Unsupported;with modified independence ADL Goal: Lower Body Dressing - Progress: Goal set today Pt Will Transfer to Toilet: with modified independence;3-in-1 ADL Goal: Toilet Transfer - Progress: Goal set today  Visit Information  Last OT Received On: 12/21/12 Assistance Needed: +1 (+2 for lines) PT/OT Co-Evaluation/Treatment: Yes  Subjective Data   Subjective: "im hungry"- pt requesting food Patient Stated Goal: to eat-    Prior Functioning     Home Living Lives With: Spouse Available Help at Discharge: Family;Available 24 hours/day Type of Home: House Home Access: Stairs to enter [employer] of Steps: 3 (4 steps on front porch with BIl rails/ 3 from carport) Entrance Stairs-Rails: None Home Layout: Two level;Laundry or work area in basement Alternate Teacher, music of Steps: 1 Alternate Level Stairs-Rails: None (sunken living room space) Bathroom Shower/Tub: Tub/shower unit;Door [employer] Toilet: Standard Home Adaptive Equipment: Straight cane;Crutches Prior Function Level of Independence: Independent Able to Take Stairs?: Yes Driving: Yes 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 Musician: No difficulties Dominant Hand: Right         Vision/Perception     Cognition  Overall Cognitive Status: Appears within functional limits for tasks assessed/performed Arousal/Alertness: Awake/alert Orientation Level: Appears intact for tasks assessed Behavior During Session: [facility] for tasks performed    Extremity/Trunk Assessment Right Upper Extremity Assessment RUE ROM/Strength/Tone: Within functional levels RUE Sensation: WFL - Light Touch RUE Coordination: WFL - gross/fine motor Left Upper Extremity Assessment LUE ROM/Strength/Tone: Deficits LUE ROM/Strength/Tone Deficits: 4 out 5 MMT, hand grasp Brunstrom IV, pt with decreased elbow flexion.  LUE Coordination: WFL - gross motor (decreased Fine motor) Trunk Assessment Trunk Assessment: Normal     Mobility Bed Mobility Bed Mobility: Supine to Sit;Sitting - Scoot to Edge of Bed Supine to Sit: 4: Min assist;HOB flat Sitting - Scoot to [employer] of Bed: 4: Min guard Details for Bed Mobility Assistance: mod v/c for sequence for sequenital UB /LB mobility Transfers Transfers: Sit to Stand;Stand to Sit Sit to  Stand: 4: Min guard;With upper extremity assist;From bed Stand to Sit: 4: Min guard;With upper extremity assist;To chair/3-in-1 Details for Transfer Assistance: pt with good awareness of lines and leads without v/c needed     Shoulder Instructions     Exercise     Balance Static Sitting Balance Static Sitting - Balance Support: No upper extremity supported;Feet supported Static Sitting - Level of Assistance: 5: Stand by assistance Static Standing Balance Static Standing - Balance Support: During functional activity;No upper extremity supported Static Standing - Level of Assistance: 5: Stand by assistance   End of Session OT - End of Session Equipment Utilized During Treatment: Gait belt Activity Tolerance: Patient tolerated treatment well Patient left: in chair;with call bell/phone within reach;with family/visitor present (SLp present for swallowing) Nurse Communication: Mobility status;Precautions  GO     Lucile Shutters 12/21/2012, 11:34 AM Pager: [phone]

## 2012-12-21 NOTE — Evaluation (Signed)
Physical Therapy Evaluation Patient Details Name: Carl Wood MRN: [id number] DOB: 1941-04-28 Today's Date: 12/21/2012 Time: 4098-1191 PT Time Calculation (min): 23 min  PT Assessment / Plan / Recommendation Clinical Impression    72 yo male admitted with Rt sided weakness and speech difficulty. Pt with hx of PE, AFIB, CHF, DM, HTN found to have INR 1.49 after stopping coumdin x2 weeks ago prior to colonscopy. Pt with NIH of 5 and then in ED found to have NIH =1. Pt relapsed in ED with deficits of expressive aphasia and rt hemiplegia. Ct reveals Lt MCA hyodense intravascular thombus. Pt underwent cerebral aniography of thrombus in lt internal carotid artery above common carotid artery bifurcation and extending into middle cerebral & anterior artery branches. Pt intubated 12/19/12-12/20/12 pt provided intra-arterial TPA infusion . Pt with sheath and sheath removed 12/20/12 in Rt LE. Pt with small basal ganglia infarct considering ICA occlusion.  Pt moving well however limited evaluation due to pt fixated on swallowing and speech therapist entered room.  Pt will benefit from acute PT services for further balance assessment, gait and overall functional mobility.  Anticipate d/c to OPPT.    PT Assessment  Patient needs continued PT services    Follow Up Recommendations  Outpatient PT    Barriers to Discharge None      Equipment Recommendations  None recommended by PT    Frequency Min 4X/week    Precautions / Restrictions Precautions Precautions: Fall   Pertinent Vitals/Pain C/o headache but does not rate      Mobility  Bed Mobility Bed Mobility: Supine to Sit;Sitting - Scoot to Edge of Bed Supine to Sit: 4: Min assist;HOB flat Sitting - Scoot to [place] of Bed: 4: Min guard Details for Bed Mobility Assistance: mod v/c for sequence for sequenital UB /LB mobility Transfers Transfers: Sit to Stand;Stand to Sit Sit to Stand: 4: Min guard;With upper extremity assist;From bed Stand to  Sit: 4: Min guard;With upper extremity assist;To chair/3-in-1 Details for Transfer Assistance: pt with good awareness of lines and leads without v/c needed Ambulation/Gait Ambulation/Gait Assistance: 4: Min guard Ambulation Distance (Feet): 10 Feet (to recliner) Assistive device: None Ambulation/Gait Assistance Details: Minguard for safety with guarded gait Gait Pattern: Step-through pattern;Shuffle Gait velocity: decreased gait speed Stairs: No Modified Rankin (Stroke Patients Only) Pre-Morbid Rankin Score: No symptoms Modified Rankin: Slight disability     PT Diagnosis: Difficulty walking;Generalized weakness  PT Problem List: Decreased activity tolerance;Decreased mobility;Decreased knowledge of use of DME PT Treatment Interventions: DME instruction;Gait training;Stair training;Functional mobility training;Therapeutic activities;Therapeutic exercise;Balance training;Neuromuscular re-education;Cognitive remediation;Patient/family education   PT Goals Acute Rehab PT Goals PT Goal Formulation: With patient Time For Goal Achievement: 12/28/12 Potential to Achieve Goals: Good Pt will go Supine/Side to Sit: Independently PT Goal: Supine/Side to Sit - Progress: Goal set today Pt will go Sit to Supine/Side: Independently PT Goal: Sit to Supine/Side - Progress: Goal set today Pt will go Sit to Stand: Independently PT Goal: Sit to Stand - Progress: Goal set today Pt will go Stand to Sit: Independently PT Goal: Stand to Sit - Progress: Goal set today Pt will Ambulate: >150 feet;with modified independence;with least restrictive assistive device PT Goal: Ambulate - Progress: Goal set today Pt will Go Up / Down Stairs: 3-5 stairs;with modified independence;with least restrictive assistive device PT Goal: Up/Down Stairs - Progress: Goal set today  Visit Information  Last PT Received On: 12/21/12 Assistance Needed: +1 (+2 for lines)    Subjective Data  Subjective: "I ready for  the  swallowing test. Patient Stated Goal: To go home   Prior Functioning  Home Living Lives With: Spouse Available Help at Discharge: Family;Available 24 hours/day Type of Home: House Home Access: Stairs to enter [employer] of Steps: 3 (4 steps on front porch with BIl rails/ 3 from carport) Entrance Stairs-Rails: None Home Layout: Two level;Laundry or work area in basement Alternate Teacher, music of Steps: 1 Alternate Level Stairs-Rails: None (sunken living room space) Bathroom Shower/Tub: Tub/shower unit;Door [employer] Toilet: Standard Home Adaptive Equipment: Straight cane;Crutches Prior Function Level of Independence: Independent Able to Take Stairs?: Yes Driving: Yes 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 Musician: No difficulties Dominant Hand: Right    Cognition  Overall Cognitive Status: Appears within functional limits for tasks assessed/performed Arousal/Alertness: Awake/alert Orientation Level: Appears intact for tasks assessed Behavior During Session: [facility] for tasks performed    Extremity/Trunk Assessment Right Upper Extremity Assessment RUE ROM/Strength/Tone: Within functional levels RUE Sensation: WFL - Light Touch RUE Coordination: WFL - gross/fine motor Left Upper Extremity Assessment LUE ROM/Strength/Tone: Deficits LUE ROM/Strength/Tone Deficits: 4 out 5 MMT, hand grasp Brunstrom IV, pt with decreased elbow flexion.  LUE Coordination: WFL - gross motor (decreased Fine motor) Right Lower Extremity Assessment RLE ROM/Strength/Tone: Within functional levels (except 4/5 hip flexors) RLE Sensation: WFL - Light Touch Left Lower Extremity Assessment LLE ROM/Strength/Tone: Within functional levels (except 4/5 hip flexors) LLE Sensation: WFL - Light Touch Trunk Assessment Trunk Assessment: Normal   Balance Static Sitting Balance Static Sitting - Balance Support: No upper extremity supported;Feet  supported Static Sitting - Level of Assistance: 5: Stand by assistance Static Standing Balance Static Standing - Balance Support: During functional activity;No upper extremity supported Static Standing - Level of Assistance: 5: Stand by assistance  End of Session PT - End of Session Equipment Utilized During Treatment: Gait belt Activity Tolerance: Patient tolerated treatment well Patient left: in chair;with call bell/phone within reach (speech therapist present for swallowing eval) Nurse Communication: Mobility status  GP     Jamez Ambrocio 12/21/2012, 1:09 PM Jake Shark, PT DPT [phone]

## 2012-12-21 NOTE — Consult Note (Signed)
PULMONARY  / CRITICAL Wood MEDICINE  Name: Carl Wood MRN: [id number] DOB: [birth year]    LOS: 2  REFERRING MD:  Stroke Pearlean Brownie)  CHIEF COMPLAINT:  Acute respiratory failure  BRIEF PATIENT DESCRIPTION: 72 yo admitted on 1/14 with acute CVA s/p intraarterial tPA infusion and thrombectomy.  Brought to ICU intubated.  LINES / TUBES: OETT 1/14 >>>Extubate 1/15 OGT 1/14 >>>Removed 1/15 L rad A-line 1/14 >>>Removed 1/16 R fem A-line 1/14 >>>Removed 1/15 Foley 1/14 >>>Removed 1/15  CULTURES:  ANTIBIOTICS:  SIGNIFICANT EVENTS:  1/14  Admitted with acute SVA.  Given intraarterial tPA.  Underwent thrombectomy.  Brought to ICU intubated. 1/15 awake, weaning, Extubated 1/16 Pt OOB to chair, Pending swallow eval  LEVEL OF Wood:  ICU PRIMARY SERVICE:  Stroke  CONSULTANTS:  PCCM CODE STATUS:  Full DIET:  NPO DVT Px:  SCDs GI Px:  Protonix   INTERVAL HISTORY: 1/15 - improved neuro status.  Denies chest pain,  respiratory distress.  Reports feeling improved today with mobility.     VITAL SIGNS: Temp:  [97.9 F (36.6 C)-98.9 F (37.2 C)] 97.9 F (36.6 C) (01/16 0801) Pulse Rate:  [63-104] 82  (01/16 0800) Resp:  [6-43] 16  (01/16 0800) BP: (95-142)/(45-71) 112/55 mmHg (01/16 0800) SpO2:  [92 %-98 %] 96 % (01/16 0800) Arterial Line BP: (117-124)/(47-50) 124/50 mmHg (01/16 0800) HEMODYNAMICS:        INTAKE / OUTPUT: Intake/Output      01/15 0701 - 01/16 0700 01/16 0701 - 01/17 0700   I.V. (mL/kg) 1726.4 (19.5) 1050 (11.9)   Total Intake(mL/kg) 1726.4 (19.5) 1050 (11.9)   Urine (mL/kg/hr) 900 (0.4) 180 (0.5)   Total Output 900 180   Net +826.4 +870          Lab 12/21/12 0500 12/20/12 0630 12/20/12 0530  NA 143 140 PATIENT IDENTIFICATION ERROR. PLEASE DISREGARD RESULTS. ACCOUNT WILL BE CREDITED.  K 3.5 3.9 3.9  CL 108 106 105  CO2 25 24 21   BUN 10 8 15   CREATININE 0.67 0.67 0.54  GLUCOSE 97 130* 113*    Lab 12/21/12 0500 12/20/12 0630 12/19/12 1041 12/19/12  0940  HGB 12.6* 14.2 16.3 --  HCT 36.9* 41.3 48.0 --  WBC 7.5 11.2* -- 7.2  PLT 130* 149* -- 181   ABG    Component Value Date/Time   PHART 7.407 12/20/2012 1030   PCO2ART 38.7 12/20/2012 1030   PO2ART 94.3 12/20/2012 1030   HCO3 23.9 12/20/2012 1030   TCO2 25.1 12/20/2012 1030   ACIDBASEDEF 0.2 12/20/2012 1030   O2SAT 97.5 12/20/2012 1030   CBG (last 3)   Basename 12/21/12 0800 12/21/12 0444 12/21/12 0019  GLUCAP 86 83 89     PHYSICAL EXAMINATION: General: Cooperative, OOB to chair Neuro:  Awake, alert, nonfocal HEENT:  PERRL, No JVD Cardiovascular:  RRR Lungs:  Lt lobes clear, Mild rhonchi in RML RLL Abdomen:  Soft, nontender, bowel sounds diminished Musculoskeletal:  No edema Skin:  Intact  IMAGING: 1/16 PCXR>>>Improved rt base atx, ETT removed 1/15 PCXR >>> Improved rt base atx, ett 6 cm above ( will correct if not extubated) 1/14  Head CT >>> Stable CT appearance of the brain. Continued increased conspicuity of the left MCA M1 segment suggesting thrombus in the setting. No intracranial hemorrhage or mass effect.  ECG:  DIAGNOSES: Principal Problem:  *CVA (cerebral infarction) Active Problems:  DIABETES MELLITUS, TYPE II  HYPERLIPIDEMIA  HYPERTENSION  CORONARY ARTERY DISEASE  CARDIOMYOPATHY, ISCHEMIC  CONGESTIVE HEART FAILURE  PULMONARY EMBOLISM, HX OF  DVT (deep venous thrombosis)  Automatic implantable cardiac defibrillator  medtronic  Warfarin-induced coagulopathy  TIA (transient ischemic attack)  Atrial fibrillation  Acute respiratory failure  ASSESSMENT / PLAN:  PULMONARY  A:  Acute respiratory failure.  Possible aspiration, residual RLL atx cxr w/out sig change. No cough.  P:   pcxr improved rt base, extubated,  aggressiive flutter, IS pcxr follow up OOB with PT/OT Cough/Deep breathing  CARDIOVASCULAR  A:  H/o arrhythmia, s/p pacemaker AICD placement - per RN reported frequent firing weeks prior to admission, missed Cardiology appointment.   CHF without exacerbation.  CAD.  Dyslipidemia. P:  Goal SBP < 180 and DBP < 105, could consider more aggressive control over next 24 hrs Cardiology consult in AM ([name]), apprciated Would use cardizem if needed Lipitor, Coreg, Vasotec, Lanoxin post swallow eval  RENAL  A:  Normal renal function. P:   NS@75 , KVO when taking PO fluids Trend BMP in am  Avoid pos balance  GASTROINTESTINAL  A:  No active issues P:  Pending Swallow eval  HEMATOLOGIC  A:  No active issues. P:  Trend CBC Anticoagulation when safe per neuro- likely to start  INFECTIOUS  A:  Possible aspiration but no evidence of infection. P: Flutter valve and IS. Encourage cough and deep breathing.  OOB and mobile with PT/OT as tolerated.   Watch fever curve and WBCs  ENDOCRINE   A:  Normoglycemia. P:   Add cbg  NEUROLOGIC  A:  Acute CVA s/p intraarterial tPA and thrombectomy. Neuro status improving, close to baseline. P:   Aggressive pt / ot  CLINICAL SUMMARY:  72 yo admitted on 1/14 with acute CVA s/p intraarterial tPA infusion and thrombectomy.  Brought to ICU intubated. Extubated yesterday with improving neuro status.  Now OOB and close to baseline.   No further PCCM issues we will s/o.   I have personally obtained a history, examined the patient, evaluated laboratory and imaging results, formulated the assessment and plan and placed orders.  Wood,Carl Pulmonary [facility] Pager: [phone]  12/21/2012, 11:23 AM  I have fully examined this patient and agree with above findings.    And edited i nfull [place] Carl. Wood Alias, MD, FACP Pgr: [phone] Carl Wood

## 2012-12-21 NOTE — Progress Notes (Signed)
Patient: Carl Wood Date of Encounter: 12/21/2012, 8:37 AM Admit date: 12/19/2012     Subjective  Mr. Power has no complaints this AM other than his left eye feels sore. He denies CP, SOB, palpitations or dizziness.     Objective  Physical Exam: Vitals: BP 97/45  Pulse 76  Temp 97.9 F (36.6 C) (Oral)  Resp 20  Ht 5\' 8"  (1.727 m)  Wt 194 lb 14.2 oz (88.4 kg)  BMI 29.63 kg/m2  SpO2 93% General: Well developed 72 year old male in no acute distress. Neck: Supple. JVD not elevated. Lungs: Clear bilaterally to auscultation without wheezes, rales, or rhonchi. Breathing is unlabored. Heart: Irregularly irregular S1 S2 without murmurs, rubs, or gallops.  Abdomen: Soft, non-distended. Extremities: No clubbing or cyanosis. No edema.  Distal pedal pulses are 2+ and equal bilaterally. Neuro: Alert and oriented x 3. Moves all extremities spontaneously.   Intake/Output:  Intake/Output Summary (Last 24 hours) at 12/21/12 0837 Last data filed at 12/21/12 0600  Gross per 24 hour  Intake 352.25 ml  Output    900 ml  Net -547.75 ml    Inpatient Medications:     . antiseptic oral rinse  15 mL Mouth Rinse QID  . aspirin  300 mg Rectal Daily  . insulin aspart  0-9 Units Subcutaneous Q4H  . pantoprazole (PROTONIX) IV  40 mg Intravenous QHS      . sodium chloride 75 mL/hr at 12/21/12 0835  . niCARDipine      Labs:  [facility] 12/21/12 0500 12/20/12 0630 12/19/12 2150  NA 143 140 --  K 3.5 3.9 --  CL 108 106 --  CO2 25 24 --  GLUCOSE 97 130* --  BUN 10 8 --  CREATININE 0.67 0.67 --  CALCIUM 8.1* 8.6 --  MG -- -- 1.7  PHOS -- -- --    Basename 12/19/12 0940  AST 21  ALT 19  ALKPHOS 80  BILITOT 0.6  PROT 7.1  ALBUMIN 3.7    Basename 12/21/12 0500 12/20/12 0630  WBC 7.5 11.2*  NEUTROABS 5.6 9.4*  HGB 12.6* 14.2  HCT 36.9* 41.3  MCV 96.3 96.9  PLT 130* 149*    Basename 12/19/12 0940  CKTOTAL --  CKMB --  TROPONINI <0.30    Basename 12/20/12 0630   HGBA1C 6.5*    Basename 12/20/12 0630  CHOL 113  HDL 28*  LDLCALC 61  TRIG 161  CHOLHDL 4.0    Basename 12/19/12 0940  INR 1.49    Radiology/Studies: Ct Head Wo Contrast  12/21/2012  *RADIOLOGY REPORT*  Clinical Data: Follow-up left brain stroke.  CT HEAD WITHOUT CONTRAST  Technique:  Contiguous axial images were obtained from the base of the skull through the vertex without contrast.  Comparison: Most recent 12/20/2012  Findings: Slight progression of left lenticulostriate artery territory infarct affecting a slightly larger area of the caudate, globus pallidus, putamen, and anterior limb internal capsule.  No intervening hemorrhage.  No cortical infarct.  Slight mass effect on the left lateral ventricle. No other interval changes.  IMPRESSION: Slight progression of the extent of cytotoxic edema around  the previously identified left basal ganglia infarct. No interval hemorrhage   Original Report Authenticated By: Davonna Belling, M.D.    Ct Head Wo Contrast  12/20/2012  *RADIOLOGY REPORT*  Clinical Data: Follow-up t-PA.  CT HEAD WITHOUT CONTRAST  Technique:  Contiguous axial images were obtained from the base of the skull through the vertex without contrast.  Comparison: 12/19/2012  Findings: Developing area of low attenuation in the left basal ganglia predominately involving the caudate the and internal capsule region.  This is consistent with evolving infarct.  No significant mass effect or midline shift.  Mild diffuse cerebral atrophy.  No ventricular dilatation.  Gray-white matter junctions are distinct.  Basal cisterns are not effaced.  No evidence of acute intracranial hemorrhage.  IMPRESSION: Low attenuation change in the left internal capsule and basal ganglia consistent with evolving acute infarct.  No acute intracranial hemorrhage.   Original Report Authenticated By: Burman Nieves, M.D.    Dg Chest Port 1 View  12/21/2012  *RADIOLOGY REPORT*  Clinical Data: Assess atelectasis   PORTABLE CHEST - 1 VIEW  Comparison: 01/15/2014at [phone]  Findings: The endotracheal tube and nasogastric tube have been removed.  A right subclavian single lead pacer is stable in position.  Lung volumes remain unchanged.  Persistent mild cardiomegaly is noted.  A stable mediastinal contour is seen.  The lung fields demonstrate pulmonary vascular congestion with probable mild interstitial edema. Blunting of the right costophrenic angle likely reflects a small pleural effusion.  More focal density at the right lung base is seen and is unchanged in comparison with the most recent prior exam but remains improved since the initial exam on 12/19/2012.  The remainder of the lung fields are clear.  IMPRESSION: Unchanged cardiopulmonary appearance post extubation with pulmonary vascular congestion, probable mild interstitial edema and unchanged right lower lobe atelectasis versus infiltrate   Original Report Authenticated By: Rhodia Albright, M.D.    Portable Chest Xray In Am  12/20/2012  *RADIOLOGY REPORT*  Clinical Data: Endotracheal tube evaluation  PORTABLE CHEST - 1 VIEW  Comparison: Chest radiograph 12/19/2012  Findings: Endotracheal tube and NG tube are unchanged.  Stable cardiac silhouette.  There is air space disease the right lower lobe not significantly changed.  No pneumothorax.  Right-sided pacemaker noted.  IMPRESSION:  1.  Stable support apparatus. 2.  Right lower lobe air space disease.   Original Report Authenticated By: Genevive Bi, M.D.     Telemetry: atrial fibrillation, rate controlled in the 70s-80s; occasional PVC/ventricular couplets    Assessment and Plan  1. Acute left MCA CVA  Per Neuro/Stroke team  Now extubated, for swallow eval today 2. Atrial fibrillation  Currently rate controlled and would keep HR <110 bpm  When okay from neuro standpoint, would add back carvedilol and digoxin per home dose and schedule with close watch over BP and HR  When okay from neuro standpoint,  would start back on chronic anticoagulation ([name] discussed this directly with Dr. Pearlean Brownie yesterday who stated he planned to start Xarelto once patient able to take PO meds)  3. Subtherapeutic INR  4. Ischemic CM with chronic systolic HF  Resume medical therapy with carvedilol and ACEI when able  5. Paroxysmal VT s/p ICD  Stable; no VT/VF episodes by device interrogation yesterday; no ventricular arrhythmias on tele while here  As outlined above, resume BB when able   Signed, EDMISTEN, BROOKE PA-C   I have seen, examined the patient, and reviewed the above assessment and plan.  Changes to above are made where necessary.  Pt is now eating and has been placed on xarelto.  CHF/ afib medicines have also been restarted.  He appears to be making good progress.  We will see as needed.  Please call with questions.  Co Sign: Hillis Range, MD 12/21/2012 7:23 PM

## 2012-12-21 NOTE — Progress Notes (Signed)
Stroke Team Progress Note  HISTORY Carl Wood is an 72 y.o. male history of pulmonary embolism, atrial fibrillation, on Coumadin, congestive heart failure, diabetes mellitus, hypertension and hyperlipidemia, presenting with onset of speech difficulty and right-sided weakness at 8:45 AM today 12/19/2012. Has no previous history of stroke nor TIA. Patient has been on Coumadin long-term Coumadin suspended 2 weeks ago for colonoscopy. Coumadin was restarted on 12/11/2012. INR today was 1.49. Initial CT scan showed no acute intracranial abnormality. Initial NIH stroke score was 5. Patient improved in the emergency room to a stroke score of 1. Thrombolytic therapy with IV TPA was not elected because of the minimal residual neurologic deficits. Shortly before noon patient had a relapse of deficits with expressive aphasia and right hemiplegia. Repeat CT scan showed hypodensity involving the left MCA indicative of intravascular thrombus. Patient was taken to interventional radiology. Cerebral angiography demonstrated thrombosis involving the left internal carotid artery just above the common carotid artery bifurcation and extending into the middle cerebral and anterior cerebral artery branches. Patient subsequently underwent intra-arterial TPA infusion followed by thrombectomy. MCA territory circulation was restored and all branches. Patient had good collateral filling of the left ACA via the anterior communicating artery. He was subsequently admitted to the neuro intensive care unit for further management. He was admitted to the neuro ICU for further evaluation and treatment.  SUBJECTIVE Wife and daughters at the bedside. He is now extubated and tolerating well.  OBJECTIVE Most recent Vital Signs: Filed Vitals:   12/21/12 0500 12/21/12 0600 12/21/12 0700 12/21/12 0801  BP: 116/53 112/66 97/45   Pulse: 82 74 76   Temp:    97.9 F (36.6 C)  TempSrc:    Oral  Resp: 29 31 20    Height:      Weight:        SpO2: 95% 94% 93%    CBG (last 3)   Basename 12/21/12 0444 12/21/12 0019 12/20/12 2003  GLUCAP 83 89 82   IV Fluid Intake:     . sodium chloride 75 mL/hr at 12/21/12 0700  . niCARDipine     MEDICATIONS    . antiseptic oral rinse  15 mL Mouth Rinse QID  . aspirin  300 mg Rectal Daily  . insulin aspart  0-9 Units Subcutaneous Q4H  . pantoprazole (PROTONIX) IV  40 mg Intravenous QHS   PRN:  acetaminophen, acetaminophen, acetaminophen, acetaminophen, albuterol, labetalol, ondansetron (ZOFRAN) IV, ondansetron (ZOFRAN) IV  Diet:  NPO  Activity:  Bedrest DVT Prophylaxis:  SCDs   CLINICALLY SIGNIFICANT STUDIES Basic Metabolic Panel:   Lab 12/21/12 0500 12/20/12 0630 12/19/12 2150  NA 143 140 --  K 3.5 3.9 --  CL 108 106 --  CO2 25 24 --  GLUCOSE 97 130* --  BUN 10 8 --  CREATININE 0.67 0.67 --  CALCIUM 8.1* 8.6 --  MG -- -- 1.7  PHOS -- -- --   Liver Function Tests:   Lab 12/19/12 0940  AST 21  ALT 19  ALKPHOS 80  BILITOT 0.6  PROT 7.1  ALBUMIN 3.7   CBC:   Lab 12/21/12 0500 12/20/12 0630  WBC 7.5 11.2*  NEUTROABS 5.6 9.4*  HGB 12.6* 14.2  HCT 36.9* 41.3  MCV 96.3 96.9  PLT 130* 149*   Coagulation:   Lab 12/19/12 0940  LABPROT 17.6*  INR 1.49   Cardiac Enzymes:   Lab 12/19/12 0940  CKTOTAL --  CKMB --  CKMBINDEX --  TROPONINI <0.30   Urinalysis: No results  found for this basename: COLORURINE:2,APPERANCEUR:2,LABSPEC:2,PHURINE:2,GLUCOSEU:2,HGBUR:2,BILIRUBINUR:2,KETONESUR:2,PROTEINUR:2,UROBILINOGEN:2,NITRITE:2,LEUKOCYTESUR:2 in the last 168 hours Lipid Panel    Component Value Date/Time   CHOL 113 12/20/2012 0630   TRIG 119 12/20/2012 0630   HDL 28* 12/20/2012 0630   CHOLHDL 4.0 12/20/2012 0630   VLDL 24 12/20/2012 0630   LDLCALC 61 12/20/2012 0630   HgbA1C  Lab Results  Component Value Date   HGBA1C 6.5* 12/20/2012   Urine Drug Screen:   No results found for this basename: labopia,  cocainscrnur,  labbenz,  amphetmu,  thcu,  labbarb     Alcohol Level: No results found for this basename: ETH:2 in the last 168 hours  CT of the brain   12/21/2012 Slight progression of the extent of cytotoxic dema around the previously identified left basal ganglia infarct. No interval  hemorrhage 12/20/2012   Low attenuation change in the left internal capsule and basal ganglia consistent with evolving acute infarct.  No acute intracranial hemorrhage.    12/19/2012 Stable CT appearance of the brain. Continued increased conspicuity of the left MCA M1 segment suggesting thrombus in the setting.  No intracranial hemorrhage or mass effect.   12/19/2012  Question increased density of the left middle cerebral artery suggesting thrombosis.    12/19/2012  No evidence of acute intracranial abnormality  Cerebral Angiogram  12/19/2012 S/P bilateral common carotid arteriogram,followed by complete revascularization of T occlusion of the LT ICA terminus.,using 7.4 mg of IA TPA and the trevoprovue retrieval device.  MRI of the brain  defibrillator  MRA of the brain  See angio  2D Echocardiogram  EF 30-35% with no source of embolus.   Carotid Doppler  See angio  CXR   12/21/2012 Unchanged cardiopulmonary appearance post extubation with pulmonary vascular congestion, probable mild interstitial edema and unchanged right lower lobe atelectasis versus infiltrate 12/20/2012   1.  Stable support apparatus. 2.  Right lower lobe air space disease.   12/19/2012  Endotracheal tube 6 cm above the carina.  Right lower lung pneumonia versus aspiration     EKG  .   Therapy Recommendations   Physical Exam    Middle aged caucasian male Awake alert. Afebrile. Head is nontraumatic. Neck is supple without bruit. . Cardiac exam no murmur or gallop. Lungs are clear to auscultation. Distal pulses are well felt.  Neurological Exam : Awake alert follows commands well.Voice hypophonic but no aphasia or dysarthria Eye movements full.No face weakness. No drift or focal weakness. Nos  ensory loss .plantars downgoing. NIHSS zero  ASSESSMENT Mr. Carl Wood is a 72 y.o. male presenting with right sided weakness and speech difficulty. He did not receive IV tpa due to rapid improvement. Neurologic worsening in ED led to cerebral angiogram finding of left ICA occlusion. Status post revascularization with IA tPA and the trevo device. Imaging confirms a relatively small left basal ganglia infarct considering he had a ICA occlusion. Infarct felt to be embolic secondary to left ICA occlusion associated with atrial fibrillation.  Work up underway. On warfarin prior to admission for atrial fibrillation, INR subtherapeutic at 1.49. Now on aspirin 300 mg suppository daily for secondary stroke prevention. Patient with resultant right hemiparesis, VDRF.   VDRF following neuro intervention, resolved.  Left arm twitch, not felt to be seizure, resolved atrial fibrillation Diabetes, HgbA1c 6.5 Hyperlipidemia, LDL 61, on lipitor 80 PTA, at goal LDL < 100 Hypertension Pacer/Cardiac defibrillator, frequent firings prior to admission CAD - MI CHF Hx PE  Hospital day # 2  TREATMENT/PLAN  Continue aspirin 300  mg suppository daily for secondary stroke prevention. Change to xarelto once able to take pos  Check swallow  OOB. Therapy evals  Resume carvedilol and digoxin from home once able to swallow  Transfer to the floor  D/w wife, daughter and Dr Vassie Loll, MSN, RN, ANVP-BC, ANP-BC, GNP-BC [facility] Pager: [phone] 12/21/2012 8:18 AM  I have personally obtained a history, examined the patient, evaluated imaging results, and formulated the assessment and plan of care. I agree with the above.  Delia Heady, MD Medical Director [facility] Pager: [phone] 12/21/2012 8:18 AM

## 2012-12-21 NOTE — Progress Notes (Signed)
Subjective: Pt c/o left retro- orbital HA; no nausea/vomiting or other neuro changes; occ cough with chest congestion; family in room  Objective: Vital signs in last 24 hours: Temp:  [97.9 F (36.6 C)-98.9 F (37.2 C)] 97.9 F (36.6 C) (01/16 0801) Pulse Rate:  [63-104] 82  (01/16 0800) Resp:  [6-43] 16  (01/16 0800) BP: (95-144)/(45-73) 112/55 mmHg (01/16 0800) SpO2:  [92 %-98 %] 96 % (01/16 0800) Arterial Line BP: (117-133)/(47-58) 124/50 mmHg (01/16 0800) FiO2 (%):  [40 %] 40 % (01/15 1100)    Intake/Output from previous day: 01/15 0701 - 01/16 0700 In: 1726.4 [I.V.:1726.4] Out: 900 [Urine:900] Intake/Output this shift: Total I/O In: 1050 [I.V.:1050] Out: 180 [Urine:180]  Pt awake/alert; oriented to person, place, year, states today is Tuesday; PERRL/EOMI; speech nl , tongue midline, no drift, face symm, strength 5/5 all fours, sens fxn nl, FMM, finger to nose nl; rt groin soft, mildly tender, no ecchymosis, intact distal pulses  Lab Results:   Basename 12/21/12 0500 12/20/12 0630  WBC 7.5 11.2*  HGB 12.6* 14.2  HCT 36.9* 41.3  PLT 130* 149*   BMET  Basename 12/21/12 0500 12/20/12 0630  NA 143 140  K 3.5 3.9  CL 108 106  CO2 25 24  GLUCOSE 97 130*  BUN 10 8  CREATININE 0.67 0.67  CALCIUM 8.1* 8.6   PT/INR  Basename 12/19/12 0940  LABPROT 17.6*  INR 1.49   ABG  Basename 12/20/12 1030 12/19/12 1930  PHART 7.407 7.367  HCO3 23.9 24.1*    Studies/Results: Ct Head Wo Contrast  12/21/2012  *RADIOLOGY REPORT*  Clinical Data: Follow-up left brain stroke.  CT HEAD WITHOUT CONTRAST  Technique:  Contiguous axial images were obtained from the base of the skull through the vertex without contrast.  Comparison: Most recent 12/20/2012  Findings: Slight progression of left lenticulostriate artery territory infarct affecting a slightly larger area of the caudate, globus pallidus, putamen, and anterior limb internal capsule.  No intervening hemorrhage.  No cortical  infarct.  Slight mass effect on the left lateral ventricle. No other interval changes.  IMPRESSION: Slight progression of the extent of cytotoxic edema around  the previously identified left basal ganglia infarct. No interval hemorrhage   Original Report Authenticated By: Davonna Belling, M.D.    Ct Head Wo Contrast  12/20/2012  *RADIOLOGY REPORT*  Clinical Data: Follow-up t-PA.  CT HEAD WITHOUT CONTRAST  Technique:  Contiguous axial images were obtained from the base of the skull through the vertex without contrast.  Comparison: 12/19/2012  Findings: Developing area of low attenuation in the left basal ganglia predominately involving the caudate the and internal capsule region.  This is consistent with evolving infarct.  No significant mass effect or midline shift.  Mild diffuse cerebral atrophy.  No ventricular dilatation.  Gray-white matter junctions are distinct.  Basal cisterns are not effaced.  No evidence of acute intracranial hemorrhage.  IMPRESSION: Low attenuation change in the left internal capsule and basal ganglia consistent with evolving acute infarct.  No acute intracranial hemorrhage.   Original Report Authenticated By: Burman Nieves, M.D.    Ct Head Wo Contrast  12/19/2012  *RADIOLOGY REPORT*  Clinical Data: 72 year old male status post Neurointervention for Code stroke.  CT HEAD WITHOUT CONTRAST  Technique:  Contiguous axial images were obtained from the base of the skull through the vertex without contrast.  Comparison: 1230 hours the same day and earlier.  Findings: The patient is intubated.  Stable paranasal sinuses and mastoids.  Fluid in  the pharynx. Visualized orbits and scalp soft tissues are within normal limits.  No acute osseous abnormality identified.  There is still evidence of intravascular contrast.  Asymmetric conspicuity of the left MCA M1 segment persists.  No ventriculomegaly.  No midline shift or mass effect.  Basilar cisterns remain patent.  Gray-white matter differentiation  throughout the brain appears stable.  No acute cortically based infarct is evident. No acute intracranial hemorrhage identified.  IMPRESSION: Stable CT appearance of the brain. Continued increased conspicuity of the left MCA M1 segment suggesting thrombus in the setting.  No intracranial hemorrhage or mass effect.   Original Report Authenticated By: Erskine Speed, M.D.    Ct Head Wo Contrast  12/19/2012  *RADIOLOGY REPORT*  Clinical Data: Code stroke.  Right facial droop, slurred speech.  CT HEAD WITHOUT CONTRAST  Technique:  Contiguous axial images were obtained from the base of the skull through the vertex without contrast.  Comparison: 12/19/2012  Findings: The left middle cerebral artery appears dense.  Cannot exclude MCA thrombus.  No changes within the left cerebral hemisphere of acute infarction currently.  No hemorrhage.  No hydrocephalus or midline shift.  IMPRESSION: Question increased density of the left middle cerebral artery suggesting thrombosis.   Original Report Authenticated By: Charlett Nose, M.D.    Dg Chest Port 1 View  12/21/2012  *RADIOLOGY REPORT*  Clinical Data: Assess atelectasis  PORTABLE CHEST - 1 VIEW  Comparison: 01/15/2014at [phone]  Findings: The endotracheal tube and nasogastric tube have been removed.  A right subclavian single lead pacer is stable in position.  Lung volumes remain unchanged.  Persistent mild cardiomegaly is noted.  A stable mediastinal contour is seen.  The lung fields demonstrate pulmonary vascular congestion with probable mild interstitial edema. Blunting of the right costophrenic angle likely reflects a small pleural effusion.  More focal density at the right lung base is seen and is unchanged in comparison with the most recent prior exam but remains improved since the initial exam on 12/19/2012.  The remainder of the lung fields are clear.  IMPRESSION: Unchanged cardiopulmonary appearance post extubation with pulmonary vascular congestion, probable mild  interstitial edema and unchanged right lower lobe atelectasis versus infiltrate   Original Report Authenticated By: Rhodia Albright, M.D.    Portable Chest Xray In Am  12/20/2012  *RADIOLOGY REPORT*  Clinical Data: Endotracheal tube evaluation  PORTABLE CHEST - 1 VIEW  Comparison: Chest radiograph 12/19/2012  Findings: Endotracheal tube and NG tube are unchanged.  Stable cardiac silhouette.  There is air space disease the right lower lobe not significantly changed.  No pneumothorax.  Right-sided pacemaker noted.  IMPRESSION:  1.  Stable support apparatus. 2.  Right lower lobe air space disease.   Original Report Authenticated By: Genevive Bi, M.D.    Portable Chest Xray  12/19/2012  *RADIOLOGY REPORT*  Clinical Data: Intubation, acute stroke symptoms  PORTABLE CHEST - 1 VIEW  Comparison: 08/20/2009  Findings: Endotracheal tube 6 cm above the carina.  NG tube enters the proximal stomach.  Right subclavian AICD noted.  Stable borderline cardiac enlargement with central vascular congestion. Right lower lung airspace disease noted compatible with pneumonia or aspiration.  Left lung remains clear.  No effusion or pneumothorax.  IMPRESSION: Endotracheal tube 6 cm above the carina.  Right lower lung pneumonia versus aspiration   Original Report Authenticated By: Judie Petit. Miles Costain, M.D.     Anti-infectives: Anti-infectives     Start     Dose/Rate Route Frequency Ordered Stop   12/19/12  1345   vancomycin (VANCOCIN) powder 1,000 mg  Status:  Discontinued        1,000 mg Other  Once 12/19/12 1335 12/19/12 1337   12/19/12 1345   vancomycin (VANCOCIN) IVPB 1000 mg/200 mL premix        1,000 mg 200 mL/hr over 60 Minutes Intravenous  Once 12/19/12 1337 12/19/12 1345          Assessment/Plan: S/p left basal ganglia infarct 1/14 with IA tpa/clot retrieval, revasc left ICA terminus. Antiplatelet tx.  Plans as per neuro. Awaiting transfer to floor.  LOS: 2 days    Lashaun Poch,D [facility] 12/21/2012

## 2012-12-21 NOTE — Procedures (Signed)
Objective Swallowing Evaluation: Modified Barium Swallowing Study  Patient Details  Name: Carl Wood MRN: [id number] Date of Birth: [birth year]  Today's Date: 12/21/2012 Time: 1300-1325 SLP Time Calculation (min): 25 min  Past Medical History:  Past Medical History  Diagnosis Date  . PE (pulmonary embolism)   . Colon polyps   . CHF (congestive heart failure)   . CAD (coronary artery disease)   . Diabetes mellitus   . Diverticulosis of colon   . Hyperlipidemia   . Hypertension   . MI (mitral incompetence)   . Nephrolithiasis   . V-tach    Past Surgical History:  Past Surgical History  Procedure Date  . Back surgery   . Cardiac defibrillator placement     medtronic virtuoso  . Basal cell carcinoma excision     nose  . Knee arthroplasty   . Cholecystectomy   . Colonoscopy 12/11/2012    Procedure: COLONOSCOPY;  Surgeon: Louis Meckel, MD;  Location: [facility] ENDOSCOPY;  Service: Endoscopy;  Laterality: N/A;   HPI:  Carl Wood is an 72 y.o. male history of pulmonary embolism, atrial fibrillation, on Coumadin, congestive heart failure, diabetes mellitus, hypertension and hyperlipidemia, presenting with onset of speech difficulty and right-sided weakness. Original NIHSS 5 on admission, decreased to 1 in ED however patient with a relapse of aphasia. CT showed left basal ganglia CVA. Intubated 1/14-1/15 for airway protection.      Assessment / Plan / Recommendation Clinical Impression  Dysphagia Diagnosis: Mild pharyngeal phase dysphagia Clinical impression: Patient presents with a very mild pharyngeal phase dysphagia. Swallow initiation timely and patient with full airway protection with all consistencies tested. Mild vallecular residuals noted post swallow due to base of tongue weakness which cleared with therapuetic intervention including verbal and visual cueing for dry swallow in between bites and sips. Education complete with patient, wife, and grandaughter regarding  recommended diet, rationale for swallowing precautions, and compensatory strategies. All verbalized understanding.     Treatment Recommendation  Therapy as outlined in treatment plan below    Diet Recommendation Regular;Thin liquid   Liquid Administration via: Cup;Straw Medication Administration: Whole meds with liquid Supervision: Patient able to self feed;Intermittent supervision to cue for compensatory strategies Compensations: Slow rate;Small sips/bites;Multiple dry swallows after each bite/sip Postural Changes and/or Swallow Maneuvers: Seated upright 90 degrees    Other  Recommendations Recommended Consults: MBS Oral Care Recommendations: Oral care BID   Follow Up Recommendations   (TBD pending cognitive-linguistic evaluation)    Frequency and Duration min 2x/week  1 week       SLP Swallow Goals Patient will utilize recommended strategies during swallow to increase swallowing safety with: Modified independent assistance Swallow Study Goal #2 - Progress: Not met   General HPI: Carl Wood is an 72 y.o. male history of pulmonary embolism, atrial fibrillation, on Coumadin, congestive heart failure, diabetes mellitus, hypertension and hyperlipidemia, presenting with onset of speech difficulty and right-sided weakness. Original NIHSS 5 on admission, decreased to 1 in ED however patient with a relapse of aphasia. CT showed left basal ganglia CVA. Intubated 1/14-1/15 for airway protection.  Type of Study: Modified Barium Swallowing Study Reason for Referral: Objectively evaluate swallowing function Previous Swallow Assessment: BSE this am 1/16 indicated s/s of aspiration with need for objective study Diet Prior to this Study: NPO (except ice chips) Temperature Spikes Noted: No Respiratory Status: Supplemental O2 delivered via (comment) (nasal cannula) History of Recent Intubation: Yes Length of Intubations (days): 1 days Date extubated: 12/21/12 Behavior/Cognition:  Alert;Cooperative;Pleasant mood Oral Cavity - Dentition: Adequate natural dentition Oral Motor / Sensory Function: Impaired - see Bedside swallow eval Self-Feeding Abilities: Able to feed self Patient Positioning: Upright in chair Baseline Vocal Quality: Clear;Low vocal intensity Volitional Cough: Strong Volitional Swallow: Able to elicit Anatomy: Within functional limits Pharyngeal Secretions: Not observed secondary MBS    Reason for Referral Objectively evaluate swallowing function   Oral Phase Oral Preparation/Oral Phase Oral Phase: WFL   Pharyngeal Phase Pharyngeal Phase Pharyngeal Phase: Impaired Pharyngeal - Thin Pharyngeal - Thin Cup: Pharyngeal residue - valleculae;Reduced tongue base retraction Pharyngeal - Thin Straw: Pharyngeal residue - valleculae;Reduced tongue base retraction Pharyngeal - Solids Pharyngeal - Puree: Pharyngeal residue - valleculae;Reduced tongue base retraction Pharyngeal - Mechanical Soft: Pharyngeal residue - valleculae;Reduced tongue base retraction Pharyngeal - Pill: Within functional limits (provided whole w/ thin liquids, trace thin vallecula residue)  Cervical Esophageal Phase    GO   Carl Lango MA, CCC-SLP [phone]  Cervical Esophageal Phase Cervical Esophageal Phase: [facility]         Beverely Suen [name] 12/21/2012, 1:49 PM

## 2012-12-21 NOTE — Evaluation (Signed)
Clinical/Bedside Swallow Evaluation Patient Details  Name: Carl Wood MRN: [id number] Date of Birth: [birth year]  Today's Date: 12/21/2012 Time: 1478-2956 SLP Time Calculation (min): 30 min  Past Medical History:  Past Medical History  Diagnosis Date  . PE (pulmonary embolism)   . Colon polyps   . CHF (congestive heart failure)   . CAD (coronary artery disease)   . Diabetes mellitus   . Diverticulosis of colon   . Hyperlipidemia   . Hypertension   . MI (mitral incompetence)   . Nephrolithiasis   . V-tach    Past Surgical History:  Past Surgical History  Procedure Date  . Back surgery   . Cardiac defibrillator placement     medtronic virtuoso  . Basal cell carcinoma excision     nose  . Knee arthroplasty   . Cholecystectomy   . Colonoscopy 12/11/2012    Procedure: COLONOSCOPY;  Surgeon: Louis Meckel, MD;  Location: [facility];  Service: Endoscopy;  Laterality: N/A;   HPI:  Carl Wood is an 72 y.o. male history of pulmonary embolism, atrial fibrillation, on Coumadin, congestive heart failure, diabetes mellitus, hypertension and hyperlipidemia, presenting with onset of speech difficulty and right-sided weakness. Original NIHSS 5 on admission, decreased to 1 in ED however patient with a relapse of aphasia. CT showed left basal ganglia CVA. Intubated 1/14-1/15 for airway protection.    Assessment / Plan / Recommendation Clinical Impression  Patient presents with subtle indication of an oropharyngeal dysphagia as indicated by mild right sided labial weakness and multiple swallows with thin liquids suggestive of pharyngeal weakness. Inconsistent s/s of aspiration characterized by intermittent throat clearing and coughing noted. Therapuetic intervention including verbal cueing for decreased sip size, trials of varying liquids consistencies, and slowed rate of intake did not change outcome. Difficult to r/o aspiration vs baseline congested cough at this time which  family reports has been occurring for months prior to admission. In light of recent CVA and brief intubation, will proceed with objective evaluation prior to initiation of a po diet.     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 male history of pulmonary embolism, atrial fibrillation, on Coumadin, congestive heart failure, diabetes mellitus, hypertension and hyperlipidemia, presenting with onset of speech difficulty and right-sided weakness. Original NIHSS 5 on admission, decreased to 1 in ED however patient with a relapse of aphasia. CT showed left basal ganglia CVA. Intubated 1/14-1/15 for airway protection.  Type of Study: Bedside swallow evaluation Previous Swallow Assessment: none Diet Prior to this Study: NPO Temperature Spikes Noted: No Respiratory Status: Supplemental O2 delivered via (comment) (nasal cannula) History of Recent Intubation: Yes Length of Intubations (days): 1 days Date extubated: 12/21/12 Behavior/Cognition: Alert;Cooperative;Pleasant mood Oral Cavity - Dentition: Adequate natural dentition Self-Feeding Abilities: Able to feed self Patient Positioning: Upright in chair Baseline Vocal Quality: Clear;Low vocal intensity Volitional Cough: Strong Volitional Swallow: Able to elicit    Oral/Motor/Sensory Function Overall Oral Motor/Sensory  Function: Impaired Labial ROM: Within Functional Limits Labial Symmetry: Abnormal symmetry  right Labial Strength: Within Functional Limits Labial Sensation: Within Functional Limits Lingual ROM: Within Functional Limits Lingual Symmetry: Within Functional Limits Lingual Strength: Within Functional Limits Lingual Sensation: Within Functional Limits Facial ROM: Within Functional Limits Facial Symmetry: Within Functional Limits Facial Strength: Within Functional Limits Facial Sensation: Within Functional Limits Velum: Within Functional Limits Mandible: Within Functional Limits   Ice Chips Ice chips: Not tested   Thin Liquid Thin Liquid: Impaired Presentation: Cup;Self Fed Pharyngeal  Phase Impairments: Multiple swallows;Throat Clearing - Immediate;Cough - Immediate;Cough - Delayed    Nectar Thick Nectar Thick Liquid: Impaired Presentation: Cup;Self Fed Pharyngeal Phase Impairments: Multiple swallows;Throat Clearing - Immediate;Cough - Immediate;Cough - Delayed   Honey Thick Honey Thick Liquid: Not tested   Puree Puree: Impaired Presentation: Spoon;Self Fed Pharyngeal Phase Impairments: Cough - Delayed   Solid   GO   Marquell Saenz MA, CCC-SLP [phone]  Solid: Impaired Presentation: Self Fed Pharyngeal Phase Impairments: Cough - Delayed       Mikaele Stecher [name] 12/21/2012,12:48 PM

## 2012-12-21 NOTE — Progress Notes (Signed)
Patient bp 88/54, hr 69. Page to Dr. Thad Ranger, order to hold vasotec and coreg. NS at 67ml/hr ordered. Will continue to monitor patient.

## 2012-12-22 ENCOUNTER — Encounter: Payer: Medicare Other | Admitting: Cardiology

## 2012-12-22 DIAGNOSIS — N200 Calculus of kidney: Secondary | ICD-10-CM

## 2012-12-22 LAB — GLUCOSE, CAPILLARY
Glucose-Capillary: 105 mg/dL — ABNORMAL HIGH (ref 70–99)
Glucose-Capillary: 121 mg/dL — ABNORMAL HIGH (ref 70–99)

## 2012-12-22 LAB — URINE MICROSCOPIC-ADD ON

## 2012-12-22 LAB — URINALYSIS, ROUTINE W REFLEX MICROSCOPIC: Glucose, UA: NEGATIVE mg/dL

## 2012-12-22 MED ORDER — CIPROFLOXACIN HCL 500 MG PO TABS
500.0000 mg | ORAL_TABLET | Freq: Two times a day (BID) | ORAL | Status: DC
  Administered 2012-12-22 – 2012-12-25 (×7): 500 mg via ORAL
  Filled 2012-12-22 (×9): qty 1

## 2012-12-22 NOTE — Progress Notes (Signed)
Stroke Team Progress Note  HISTORY Carl Wood is an 72 y.o. male history of pulmonary embolism, atrial fibrillation, on Coumadin, congestive heart failure, diabetes mellitus, hypertension and hyperlipidemia, presenting with onset of speech difficulty and right-sided weakness at 8:45 AM today 12/19/2012. Has no previous history of stroke nor TIA. Patient has been on Coumadin long-term Coumadin suspended 2 weeks ago for colonoscopy. Coumadin was restarted on 12/11/2012. INR today was 1.49. Initial CT scan showed no acute intracranial abnormality. Initial NIH stroke score was 5. Patient improved in the emergency room to a stroke score of 1. Thrombolytic therapy with IV TPA was not elected because of the minimal residual neurologic deficits. Shortly before noon patient had a relapse of deficits with expressive aphasia and right hemiplegia. Repeat CT scan showed hypodensity involving the left MCA indicative of intravascular thrombus. Patient was taken to interventional radiology. Cerebral angiography demonstrated thrombosis involving the left internal carotid artery just above the common carotid artery bifurcation and extending into the middle cerebral and anterior cerebral artery branches. Patient subsequently underwent intra-arterial TPA infusion followed by thrombectomy. MCA territory circulation was restored and all branches. Patient had good collateral filling of the left ACA via the anterior communicating artery. He was subsequently admitted to the neuro intensive care unit for further management. He was admitted to the neuro ICU for further evaluation and treatment.  SUBJECTIVE Wife and granddaughter are at the bedside. Patient complains of left eye pain and has dark red urine per RN. Hx kidney stones, followed by dr Brunilda Payor. Eye pain is sporatic and had been present PTA. Pain is 8 out of 10, resolved on on or with tylenol.  OBJECTIVE Most recent Vital Signs: Filed Vitals:   12/21/12 2217 12/22/12  0207 12/22/12 0558 12/22/12 0931  BP: 88/54 96/57 104/55 97/48  Pulse:  85 90 71  Temp:  98.6 F (37 C) 98.4 F (36.9 C) 98.1 F (36.7 C)  TempSrc:  Oral Oral Oral  Resp:  17 17 18   Height:      Weight:      SpO2:  94% 93% 94%   CBG (last 3)   Basename 12/22/12 0509 12/22/12 0003 12/21/12 2024  GLUCAP 105* 121* 137*   IV Fluid Intake:     . sodium chloride 75 mL/hr at 12/21/12 2230   MEDICATIONS    . atorvastatin  80 mg Oral Daily  . carvedilol  6.25 mg Oral BID  . enalapril  10 mg Oral BID  . insulin aspart  0-9 Units Subcutaneous Q4H  . rivaroxaban  20 mg Oral Q supper   PRN:  acetaminophen, acetaminophen, labetalol  Diet:  General  Activity:  OOB DVT Prophylaxis:  SCDs   CLINICALLY SIGNIFICANT STUDIES Basic Metabolic Panel:   Lab 12/21/12 0500 12/20/12 0630 12/19/12 2150  NA 143 140 --  K 3.5 3.9 --  CL 108 106 --  CO2 25 24 --  GLUCOSE 97 130* --  BUN 10 8 --  CREATININE 0.67 0.67 --  CALCIUM 8.1* 8.6 --  MG -- -- 1.7  PHOS -- -- --   Liver Function Tests:   Lab 12/19/12 0940  AST 21  ALT 19  ALKPHOS 80  BILITOT 0.6  PROT 7.1  ALBUMIN 3.7   CBC:   Lab 12/21/12 0500 12/20/12 0630  WBC 7.5 11.2*  NEUTROABS 5.6 9.4*  HGB 12.6* 14.2  HCT 36.9* 41.3  MCV 96.3 96.9  PLT 130* 149*   Coagulation:   Lab 12/19/12 0940  LABPROT  17.6*  INR 1.49   Cardiac Enzymes:   Lab 12/19/12 0940  CKTOTAL --  CKMB --  CKMBINDEX --  TROPONINI <0.30   Urinalysis: No results found for this basename: COLORURINE:2,APPERANCEUR:2,LABSPEC:2,PHURINE:2,GLUCOSEU:2,HGBUR:2,BILIRUBINUR:2,KETONESUR:2,PROTEINUR:2,UROBILINOGEN:2,NITRITE:2,LEUKOCYTESUR:2 in the last 168 hours Lipid Panel    Component Value Date/Time   CHOL 113 12/20/2012 0630   TRIG 119 12/20/2012 0630   HDL 28* 12/20/2012 0630   CHOLHDL 4.0 12/20/2012 0630   VLDL 24 12/20/2012 0630   LDLCALC 61 12/20/2012 0630   HgbA1C  Lab Results  Component Value Date   HGBA1C 6.5* 12/20/2012   Urine Drug  Screen:   No results found for this basename: labopia,  cocainscrnur,  labbenz,  amphetmu,  thcu,  labbarb    Alcohol Level: No results found for this basename: ETH:2 in the last 168 hours  CT of the brain   12/21/2012 Slight progression of the extent of cytotoxic dema around the previously identified left basal ganglia infarct. No interval  hemorrhage 12/20/2012   Low attenuation change in the left internal capsule and basal ganglia consistent with evolving acute infarct.  No acute intracranial hemorrhage.    12/19/2012 Stable CT appearance of the brain. Continued increased conspicuity of the left MCA M1 segment suggesting thrombus in the setting.  No intracranial hemorrhage or mass effect.   12/19/2012  Question increased density of the left middle cerebral artery suggesting thrombosis.    12/19/2012  No evidence of acute intracranial abnormality  Cerebral Angiogram  12/19/2012 S/P bilateral common carotid arteriogram,followed by complete revascularization of T occlusion of the LT ICA terminus.,using 7.4 mg of IA TPA and the trevoprovue retrieval device.  MRI of the brain  defibrillator  MRA of the brain  See angio  2D Echocardiogram  EF 30-35% with no source of embolus.   Carotid Doppler  See angio  CXR   12/21/2012 Unchanged cardiopulmonary appearance post extubation with pulmonary vascular congestion, probable mild interstitial edema and unchanged right lower lobe atelectasis versus infiltrate 12/20/2012   1.  Stable support apparatus. 2.  Right lower lobe air space disease.   12/19/2012  Endotracheal tube 6 cm above the carina.  Right lower lung pneumonia versus aspiration     EKG  atrial fibrillation, HR 60  Therapy Recommendations OP PT, HH OT  Physical Exam    Middle aged caucasian male Awake alert. Afebrile. Head is nontraumatic. Neck is supple without bruit. . Cardiac exam no murmur or gallop. Lungs are clear to auscultation. Distal pulses are well felt.  Neurological Exam : Awake  alert follows commands well.Voice normal per family now but no aphasia or dysarthria Eye movements full.No face weakness. No drift or focal weakness. Nos ensory loss .plantars downgoing. NIHSS zero  ASSESSMENT Mr. Carl Wood is a 72 y.o. male presenting with right sided weakness and speech difficulty. He did not receive IV tpa due to rapid improvement. Neurologic worsening in ED led to cerebral angiogram finding of left ICA occlusion. Status post revascularization with IA tPA and the trevo device. Imaging confirms a relatively small left basal ganglia infarct considering he had a ICA occlusion. Infarct felt to be embolic secondary to left ICA occlusion associated with atrial fibrillation.  Work up now completed. On warfarin prior to admission for atrial fibrillation, INR subtherapeutic at 1.49. Now on xarelto 20 mg daily for secondary stroke prevention. Patient with resultant right hemiparesis, VDRF.   Left eye pain, intermittent responding to tylenol  Hematuria, 14.2 on adm, 12.6 on 12/16  VDRF following neuro  intervention, resolved.  Left arm twitch, not felt to be seizure, resolved atrial fibrillation Diabetes, HgbA1c 6.5 Hyperlipidemia, LDL 61, on lipitor 80 PTA, at goal LDL < 100 Hypertension, hypotension over night 88/54 Pacer/Cardiac defibrillator, frequent firings prior to admission CAD - MI CHF Hx PE Bloody urine, new xarelto  Hospital day # 3  TREATMENT/PLAN  Continue xarelto 20 mg daily for secondary stroke prevention.   OP PT & OT (ordered)  D/w wife, grand-daughter . Likely DC home in am if stable  CBC in am  Called and spoke to Dr. Berneice Heinrich, urologist on call. He is not concerned with the hematuria at this time. If it increases, recommends CT to look for kidney stones  Annie Main, MSN, RN, ANVP-BC, ANP-BC, GNP-BC [facility] Pager: [phone] 12/22/2012 9:34 AM  I have personally obtained a history, examined the patient, evaluated imaging  results, and formulated the assessment and plan of care. I agree with the above.  Delia Heady, MD Medical Director [facility] Pager: [phone] 12/22/2012 9:34 AM

## 2012-12-22 NOTE — Discharge Summary (Signed)
Stroke Discharge Summary  Patient ID: Carl Wood   MRN: [id number]      DOB: Mar 30, 1941  Date of Admission: 12/19/2012 Date of Discharge: 12/25/2012  Attending Physician:  Darcella Cheshire, MD, Stroke MD  Consulting Physician(s):   Treatment Team:  Md Stroke, MD Sherren Kerns, MD pulmonary/intensive care , Dr. Darrick Penna (vascular surgeon) Patient's PCP:  Judie Petit, MD  Discharge Diagnoses:  Principal Problem:  *CVA (cerebral infarction) - relatively small left basal ganglia infarct, status post revascularization with IA tPA and the trevo device. Infarct embolic secondary to left ICA occlusion associated with atrial fibrillation.  Active Problems:  DIABETES MELLITUS, TYPE II  HYPERLIPIDEMIA  HYPERTENSION  CORONARY ARTERY DISEASE  CARDIOMYOPATHY, ISCHEMIC  CONGESTIVE HEART FAILURE  PULMONARY EMBOLISM, HX OF  DVT (deep venous thrombosis)  Automatic implantable cardiac defibrillator  medtronic  Warfarin-induced coagulopathy  TIA (transient ischemic attack)  Atrial fibrillation  Acute respiratory failure  Hematuria resolved  right femoral pseudoaneurysm, compressed  BMI: Body mass index is 29.63 kg/(m^2).  Past Medical History  Diagnosis Date  . PE (pulmonary embolism)   . Colon polyps   . CHF (congestive heart failure)   . CAD (coronary artery disease)   . Diabetes mellitus   . Diverticulosis of colon   . Hyperlipidemia   . Hypertension   . MI (mitral incompetence)   . Nephrolithiasis   . V-tach    Past Surgical History  Procedure Date  . Back surgery   . Cardiac defibrillator placement     medtronic virtuoso  . Basal cell carcinoma excision     nose  . Knee arthroplasty   . Cholecystectomy   . Colonoscopy 12/11/2012    Procedure: COLONOSCOPY;  Surgeon: Louis Meckel, MD;  Location: [facility];  Service: Endoscopy;  Laterality: N/A;      Medication List     As of 12/25/2012  3:37 PM    STOP taking these medications         warfarin  5 MG tablet   Commonly known as: COUMADIN      TAKE these medications         atorvastatin 80 MG tablet   Commonly known as: LIPITOR   Take 80 mg by mouth daily.      carvedilol 6.25 MG tablet   Commonly known as: COREG   Take 6.25 mg by mouth 2 (two) times daily.      ciprofloxacin 500 MG tablet   Commonly known as: CIPRO   Take 1 tablet (500 mg total) by mouth 2 (two) times daily.      enalapril 10 MG tablet   Commonly known as: VASOTEC   Take 10 mg by mouth 2 (two) times daily.      fish oil-omega-3 fatty acids 1000 MG capsule   Take 2 g by mouth daily.      LANOXIN 0.25 MG tablet   Generic drug: digoxin   Take 0.5 tablets (0.125 mg total) by mouth daily.      mometasone 50 MCG/ACT nasal spray   Commonly known as: NASONEX   Place 1 spray into the nose daily.      Rivaroxaban 20 MG Tabs tablet   Commonly known as: XARELTO   Take 1 tablet (20 mg total) by mouth daily.      LABORATORY STUDIES CBC    Component Value Date/Time   WBC 6.7 12/23/2012 0530   RBC 3.34* 12/23/2012 0530   HGB 11.2* 12/23/2012 0530  HCT 31.9* 12/23/2012 0530   PLT 143* 12/23/2012 0530   MCV 95.5 12/23/2012 0530   MCH 33.5 12/23/2012 0530   MCHC 35.1 12/23/2012 0530   RDW 12.8 12/23/2012 0530   LYMPHSABS 1.0 12/21/2012 0500   MONOABS 0.9 12/21/2012 0500   EOSABS 0.1 12/21/2012 0500   BASOSABS 0.0 12/21/2012 0500   CMP    Component Value Date/Time   NA 143 12/21/2012 0500   K 3.5 12/21/2012 0500   CL 108 12/21/2012 0500   CO2 25 12/21/2012 0500   GLUCOSE 97 12/21/2012 0500   GLUCOSE 98 10/10/2006 1113   BUN 10 12/21/2012 0500   CREATININE 0.67 12/21/2012 0500   CALCIUM 8.1* 12/21/2012 0500   PROT 7.1 12/19/2012 0940   ALBUMIN 3.7 12/19/2012 0940   AST 21 12/19/2012 0940   ALT 19 12/19/2012 0940   ALKPHOS 80 12/19/2012 0940   BILITOT 0.6 12/19/2012 0940   GFRNONAA >90 12/21/2012 0500   GFRAA >90 12/21/2012 0500   COAGS Lab Results  Component Value Date   INR 1.49 12/19/2012   INR 2.4 11/07/2012     INR 3.0 10/12/2012   Lipid Panel    Component Value Date/Time   CHOL 113 12/20/2012 0630   TRIG 119 12/20/2012 0630   HDL 28* 12/20/2012 0630   CHOLHDL 4.0 12/20/2012 0630   VLDL 24 12/20/2012 0630   LDLCALC 61 12/20/2012 0630   HgbA1C  Lab Results  Component Value Date   HGBA1C 6.5* 12/20/2012   Cardiac Panel (last 3 results) No results found for this basename: CKTOTAL:3,CKMB:3,TROPONINI:3,RELINDX:3 in the last 72 hours Urinalysis    Component Value Date/Time   COLORURINE RED* 12/22/2012 1106   APPEARANCEUR TURBID* 12/22/2012 1106   LABSPEC 1.035* 12/22/2012 1106   PHURINE 5.5 12/22/2012 1106   GLUCOSEU NEGATIVE 12/22/2012 1106   HGBUR LARGE* 12/22/2012 1106   BILIRUBINUR LARGE* 12/22/2012 1106   KETONESUR 15* 12/22/2012 1106   PROTEINUR 100* 12/22/2012 1106   UROBILINOGEN 1.0 12/22/2012 1106   NITRITE POSITIVE* 12/22/2012 1106   LEUKOCYTESUR MODERATE* 12/22/2012 1106   Urine Drug Screen  No results found for this basename: labopia,  cocainscrnur,  labbenz,  amphetmu,  thcu,  labbarb    Alcohol Level No results found for this basename: eth   SIGNIFICANT DIAGNOSTIC STUDIES CT of the brain  12/21/2012 Slight progression of the extent of cytotoxic dema around the previously identified left basal ganglia infarct. No interval hemorrhage  12/20/2012 Low attenuation change in the left internal capsule and basal ganglia consistent with evolving acute infarct. No acute intracranial hemorrhage.  12/19/2012 Stable CT appearance of the brain. Continued increased conspicuity of the left MCA M1 segment suggesting thrombus in the setting. No intracranial hemorrhage or mass effect.  12/19/2012 Question increased density of the left middle cerebral artery suggesting thrombosis.  12/19/2012 No evidence of acute intracranial abnormality  Cerebral Angiogram 12/19/2012 S/P bilateral common carotid arteriogram,followed by complete revascularization of T occlusion of the LT ICA terminus.,using 7.4 mg of IA TPA and  the trevoprovue retrieval device.  MRI of the brain defibrillator  MRA of the brain See angio  2D Echocardiogram EF 30-35% with no source of embolus.  Carotid Doppler See angio  LE Arterial Doppler  12/25/2012 12/23/2012 There is a large pseudoaneurysm measuring 1.74cm X 2.0cm with a neck measuring 1.04cm at the confluence tapering to 0.60cm, noted in the right groin. Large hematoma noted proximal thigh. CXR  12/21/2012 Unchanged cardiopulmonary appearance post extubation with pulmonary vascular congestion, probable mild  interstitial edema and unchanged right lower lobe atelectasis versus infiltrate  12/20/2012 1. Stable support apparatus. 2. Right lower lobe air space disease.  12/19/2012 Endotracheal tube 6 cm above the carina. Right lower lung pneumonia versus aspiration  EKG atrial fibrillation, HR 60    History of Present Illness  Carl Wood is an 72 y.o. male history of pulmonary embolism, atrial fibrillation, on Coumadin, congestive heart failure, diabetes mellitus, hypertension and hyperlipidemia, presenting with onset of speech difficulty and right-sided weakness at 8:45 AM today 12/19/2012. Has no previous history of stroke nor TIA. Patient has been on Coumadin long-term Coumadin suspended 2 weeks ago for colonoscopy. Coumadin was restarted on 12/11/2012. INR today was 1.49. Initial CT scan showed no acute intracranial abnormality. Initial NIH stroke score was 5. Patient improved in the emergency room to a stroke score of 1. Thrombolytic therapy with IV TPA was not elected because of the minimal residual neurologic deficits. Shortly before noon patient had a relapse of deficits with expressive aphasia and right hemiplegia. Repeat CT scan showed hypodensity involving the left MCA indicative of intravascular thrombus. Patient was taken to interventional radiology. Cerebral angiography demonstrated thrombosis involving the left internal carotid artery just above the common carotid artery  bifurcation and extending into the middle cerebral and anterior cerebral artery branches. Patient subsequently underwent intra-arterial TPA infusion followed by thrombectomy. MCA territory circulation was restored and all branches. Patient had good collateral filling of the left ACA via the anterior communicating artery. He was subsequently admitted to the neuro intensive care unit for further management. He was admitted to the neuro ICU for further evaluation and treatment.  Hospital Course Imaging confirms a relatively small left basal ganglia infarct considering he had a ICA occlusion. Infarct felt to be embolic secondary to left ICA occlusion associated with atrial fibrillation. On warfarin prior to admission for atrial fibrillation, INR subtherapeutic at 1.49. After discussion with [name], changed to xarelto 20 mg daily for secondary stroke prevention.   In hospital pt developed Hematuria, Hgb 14.2 on adm, 12.6 on 12/16. UA showed UTI. Cx pending. Patient with known kidney stones. Also had foley during procedure. Started cipro for prophylactic tx with 7 day course. Telephone consult to Dr. Berneice Heinrich, urology, recommended CT to look for stones should hematuria worsen.   Patient also complained of intermittent left eye pain, improved with tylenol. This resolved.  Patient developed right groin pseudoaneurysm at site of puncture site for neuro intervention. xarelto was stopped. Vascular surgeon consulted who considered operative repair. Pressure dressing applied. Repeat ultrasound day of discharge demonstrated spontaneous thrombosis with no bleeding. Ok to resume xarelto at time of discharge.  Patient with known vascular risk factors of atrial fibrillation, Diabetes, HgbA1c 6.5, Hyperlipidemia, LDL 61, on lipitor 80 PTA, at goal LDL < 100, Hypertension, hypotension during hospitalization at 88/54 but resolved, Pacer/Cardiac defibrillator, frequent firings prior to admission (checked by rep during  hospitalization) and CAD - MI.   Patient with continued stroke symptoms of mild right hemiparesis. Physical therapy, occupational therapy and speech therapy evaluated patient. They recommend OP PT and OT. Care Management arranged.  Discharge Exam  Blood pressure 95/49, pulse 70, temperature 98.8 F (37.1 C), temperature source Oral, resp. rate 18, height 5\' 8"  (1.727 m), weight 88.4 kg (194 lb 14.2 oz), SpO2 95.00%. Middle aged caucasian male Awake alert. Afebrile. Head is nontraumatic. Neck is supple without bruit. . Cardiac exam no murmur or gallop. Lungs are clear to auscultation. Distal pulses are well felt.  Neurological  Exam : Awake alert follows commands well.Voice hypophonic but no aphasia or dysarthria Eye movements full.No face weakness. No drift or focal weakness. Nos ensory loss .plantars downgoing.  NIHSS zero  Discharge Diet   Heart healthy thin liquids  Discharge Plan    Disposition:  home with wife  xarelto 20 mg daily for secondary stroke prevention. Prior authorization # [id number] through Dec 26, 2013  Ongoing risk factor control by Primary Care Physician.  Follow-up Judie Petit, MD in 1 month.  Follow-up with Dr. Delia Heady in 2 months.  Greater than 30 minutes were spent preparing discharge.   I have personally examined this patient, reviewed data and developed plan of care Delia Heady, MD Medical Director [facility] Pager: [phone] 12/26/2012 5:25 PM

## 2012-12-22 NOTE — Evaluation (Signed)
Speech Language Pathology Evaluation Patient Details Name: Carl Wood MRN: [id number] DOB: [birth year] Today's Date: 12/22/2012 Time: 1324-4010 SLP Time Calculation (min): 16 min  Problem List:  Patient Active Problem List  Diagnosis  . NEOPLASM, MALIGNANT, SKIN, EAR  . CARCINOMA, BASAL CELL  . DIABETES MELLITUS, TYPE II  . HYPERLIPIDEMIA  . PSEUDOGOUT  . HYPERTENSION  . MYOCARDIAL INFARCTION, HX OF  . CORONARY ARTERY DISEASE  . CARDIOMYOPATHY, ISCHEMIC  . CONGESTIVE HEART FAILURE  . DIVERTICULOSIS, COLON  . NEPHROLITHIASIS  . BLADDER OUTLET OBSTRUCTION  . PULMONARY EMBOLISM, HX OF  . COLONIC POLYPS, HX OF  . NEPHROLITHIASIS, HX OF  . DVT (deep venous thrombosis)  . Automatic implantable cardiac defibrillator  medtronic  .  ventricular tachycardia-non sustained   . Family history of malignant neoplasm of gastrointestinal tract  . Warfarin-induced coagulopathy  . Benign neoplasm of colon  . TIA (transient ischemic attack)  . Atrial fibrillation  . CVA (cerebral infarction)  . Acute respiratory failure   Past Medical History:  Past Medical History  Diagnosis Date  . PE (pulmonary embolism)   . Colon polyps   . CHF (congestive heart failure)   . CAD (coronary artery disease)   . Diabetes mellitus   . Diverticulosis of colon   . Hyperlipidemia   . Hypertension   . MI (mitral incompetence)   . Nephrolithiasis   . V-tach    Past Surgical History:  Past Surgical History  Procedure Date  . Back surgery   . Cardiac defibrillator placement     medtronic virtuoso  . Basal cell carcinoma excision     nose  . Knee arthroplasty   . Cholecystectomy   . Colonoscopy 12/11/2012    Procedure: COLONOSCOPY;  Surgeon: Louis Meckel, MD;  Location: [facility];  Service: Endoscopy;  Laterality: N/A;   HPI:  Carl Wood is an 72 y.o. male history of pulmonary embolism, atrial fibrillation, on Coumadin, congestive heart failure, diabetes mellitus, hypertension and  hyperlipidemia, presenting with onset of speech difficulty and right-sided weakness. Original NIHSS 5 on admission, decreased to 1 in ED however patient with a relapse of aphasia. CT showed left basal ganglia CVA. Intubated 1/14-1/15 for airway protection.    Assessment / Plan / Recommendation Clinical Impression  Cognitive-linguistic evaluation complete. Speech, language, and cognition all appear WFL. Patient occassionally slow to answer questions, largely due to [facility]. No further acute SLP f/u for cognition/language indicated at this time.     SLP Assessment  Patient does not need any further Speech Lanaguage Pathology Services    Follow Up Recommendations  None       Pertinent Vitals/Pain n/a    SLP Evaluation Prior Functioning  Cognitive/Linguistic Baseline: Within functional limits Type of Home: House Lives With: Spouse Available Help at Discharge: Family;Available 24 hours/day Vocation: Retired   IT consultant  Overall Cognitive Status: Appears within functional limits for tasks assessed    Comprehension  Auditory Comprehension Overall Auditory Comprehension: Appears within functional limits for tasks assessed Visual Recognition/Discrimination Discrimination: Within Function Limits Reading Comprehension Reading Status: Within funtional limits    Expression Expression Primary Mode of Expression: Verbal Verbal Expression Overall Verbal Expression: Appears within functional limits for tasks assessed   Oral / Motor Oral Motor/Sensory Function Overall Oral Motor/Sensory Function: Appears within functional limits for tasks assessed Motor Speech Overall Motor Speech: Appears within functional limits for tasks assessed   GO    Ferdinand Lango MA, CCC-SLP [phone]  Ferdinand Lango [name] 12/22/2012, 3:57 PM

## 2012-12-22 NOTE — Progress Notes (Signed)
Physical Therapy Treatment Patient Details Name: CABOT CROMARTIE MRN: [id number] DOB: 06-26-1941 Today's Date: 12/22/2012 Time: 4098-1191 PT Time Calculation (min): 14 min  PT Assessment / Plan / Recommendation Comments on Treatment Session  Pt making good progress with mobility at this date.  Increased ambulation distance & intiated stair training.      Follow Up Recommendations  Outpatient PT     Does the patient have the potential to tolerate intense rehabilitation     Barriers to Discharge        Equipment Recommendations  None recommended by PT    Recommendations for Other Services    Frequency Min 4X/week   Plan Discharge plan remains appropriate    Precautions / Restrictions Precautions Precautions: Fall       Mobility  Bed Mobility Bed Mobility: Supine to Sit;Sitting - Scoot to Edge of Bed Supine to Sit: 5: Supervision Sitting - Scoot to Edge of Bed: 5: Supervision Details for Bed Mobility Assistance: Cues for increased use of UE's to increase ease of transition Transfers Transfers: Sit to Stand;Stand to Sit Sit to Stand: 4: Min guard;With upper extremity assist;From bed Stand to Sit: 4: Min guard;With armrests;To chair/3-in-1;With upper extremity assist Details for Transfer Assistance: Cues for hand placement/use.   Ambulation/Gait Ambulation/Gait Assistance: 4: Min guard Ambulation Distance (Feet): 150 Feet Assistive device: Other (Comment) (pushed IV pole) Ambulation/Gait Assistance Details: Guarding for safety.  Pt reports LE's feel a little weak.   Gait Pattern: Step-through pattern;Decreased stride length Stairs: Yes Stairs Assistance: 4: Min guard Stairs Assistance Details (indicate cue type and reason): Cues to slow down to ensure safety.   Stair Management Technique: One rail Right;Forwards;Alternating pattern Number of Stairs: 5  Wheelchair Mobility Wheelchair Mobility: No Modified Rankin (Stroke Patients Only) Pre-Morbid Rankin Score: No  symptoms Modified Rankin: Slight disability      PT Goals Acute Rehab PT Goals Time For Goal Achievement: 12/28/12 Potential to Achieve Goals: Good Pt will go Supine/Side to Sit: Independently PT Goal: Supine/Side to Sit - Progress: Progressing toward goal Pt will go Sit to Supine/Side: Independently Pt will go Sit to Stand: Independently PT Goal: Sit to Stand - Progress: Progressing toward goal Pt will go Stand to Sit: Independently PT Goal: Stand to Sit - Progress: Progressing toward goal Pt will Ambulate: >150 feet;with modified independence;with least restrictive assistive device PT Goal: Ambulate - Progress: Progressing toward goal Pt will Go Up / Down Stairs: 3-5 stairs;with modified independence;with least restrictive assistive device PT Goal: Up/Down Stairs - Progress: Progressing toward goal  Visit Information  Last PT Received On: 12/22/12 Assistance Needed: +1    Subjective Data      Cognition  Overall Cognitive Status: Appears within functional limits for tasks assessed/performed Arousal/Alertness: Awake/alert Orientation Level: Appears intact for tasks assessed Behavior During Session: [facility] for tasks performed    Balance  Balance Balance Assessed: No  End of Session PT - End of Session Equipment Utilized During Treatment: Gait belt Activity Tolerance: Patient tolerated treatment well Patient left: in chair;with call bell/phone within reach Nurse Communication: Mobility status     Verdell Face, [place] [phone] 12/22/2012

## 2012-12-22 NOTE — Progress Notes (Signed)
CARE MANAGEMENT NOTE 12/22/2012  Patient:  Carl Wood, Carl Wood   Account Number:  [id number]  Date Initiated:  12/22/2012  Documentation initiated by:  Vance Peper  Subjective/Objective Assessment:   72 yr old male admitted for CVA. Has right sided weakness.     Action/Plan:   CM spoke with patient , wife and family.  D[facility]. CM faxed orders and H&P.  Family to assist at home.   Anticipated DC Date:  12/23/2012   Anticipated DC Plan:        DC Planning Services  CM consult  OP Neuro Rehab      Choice offered to / List presented to:             Status of service:  Completed, signed off Medicare Important Message given?   (If response is "NO", the following Medicare IM given date fields will be blank) Date Medicare IM given:   Date Additional Medicare IM given:    Discharge Disposition:  HOME/SELF CARE  Per UR Regulation:    If discussed at Long Length of Stay Meetings, dates discussed:    Comments:

## 2012-12-22 NOTE — Progress Notes (Signed)
Speech Language Pathology Dysphagia Treatment Patient Details Name: Carl Wood MRN: [id number] DOB: 24-Feb-1941 Today's Date: 12/22/2012 Time: 4098-1191 SLP Time Calculation (min): 10 min  Assessment / Plan / Recommendation Clinical Impression  F/u diet tolerance assessment revealed no overt indication of aspiration with clinician provided po trials. Patient able to independently verbalize results of MBS and recommendations for compensatory strategies as well as carry them over into self feeding/swallowing. Overall, appears to be tolerating current diet. Will continue to f/u briefly to ensure continued tolerance of diet and f/u education as needed. No dysphagia treatment recommended after d/c as mild deficits related to recent CVA likely to resolve quickly as patient utilizes musculature during swallow.     Diet Recommendation  Continue with Current Diet: Regular;Thin liquid    SLP Plan Continue with current plan of care   Pertinent Vitals/Pain n/a   Swallowing Goals  SLP Swallowing Goals Patient will utilize recommended strategies during swallow to increase swallowing safety with: Modified independent assistance Swallow Study Goal #2 - Progress: Progressing toward goal  General Temperature Spikes Noted: No Respiratory Status: Room air Behavior/Cognition: Alert;Cooperative;Pleasant mood Oral Cavity - Dentition: Adequate natural dentition Patient Positioning: Upright in chair   Dysphagia Treatment Treatment focused on: Skilled observation of diet tolerance;Patient/family/caregiver education;Utilization of compensatory strategies Family/Caregiver Educated: spouse, grandaughter Treatment Methods/Modalities: Skilled observation Patient observed directly with PO's: Yes Type of PO's observed: Regular;Thin liquids Feeding: Able to feed self Liquids provided via: Cup   [employer] MA, CCC-SLP [phone]  Ferdinand Lango [name] 12/22/2012, 4:03 PM

## 2012-12-22 NOTE — Progress Notes (Addendum)
Patient had voided in urinal, when emptying noted to be dark in color with blood tinge.  Patient started on Xarelto yesterday for chronic AFIB.  Page sent to Dr. Thad Ranger to inform of this finding. She advised to mention to rounding team to see if Xarelto needs to be held at 5pm

## 2012-12-23 DIAGNOSIS — I724 Aneurysm of artery of lower extremity: Secondary | ICD-10-CM

## 2012-12-23 DIAGNOSIS — S301XXA Contusion of abdominal wall, initial encounter: Secondary | ICD-10-CM | POA: Diagnosis not present

## 2012-12-23 DIAGNOSIS — M79609 Pain in unspecified limb: Secondary | ICD-10-CM

## 2012-12-23 LAB — GLUCOSE, CAPILLARY
Glucose-Capillary: 104 mg/dL — ABNORMAL HIGH (ref 70–99)
Glucose-Capillary: 107 mg/dL — ABNORMAL HIGH (ref 70–99)
Glucose-Capillary: 109 mg/dL — ABNORMAL HIGH (ref 70–99)

## 2012-12-23 LAB — CBC
MCH: 33.5 pg (ref 26.0–34.0)
MCV: 95.5 fL (ref 78.0–100.0)
Platelets: 143 10*3/uL — ABNORMAL LOW (ref 150–400)
RBC: 3.34 MIL/uL — ABNORMAL LOW (ref 4.22–5.81)
RDW: 12.8 % (ref 11.5–15.5)
WBC: 6.7 10*3/uL (ref 4.0–10.5)

## 2012-12-23 MED ORDER — MORPHINE SULFATE 2 MG/ML IJ SOLN
2.0000 mg | INTRAMUSCULAR | Status: DC | PRN
  Administered 2012-12-25: 2 mg via INTRAVENOUS
  Filled 2012-12-23 (×2): qty 1

## 2012-12-23 MED ORDER — INSULIN ASPART 100 UNIT/ML ~~LOC~~ SOLN
0.0000 [IU] | Freq: Three times a day (TID) | SUBCUTANEOUS | Status: DC
  Administered 2012-12-23: 2 [IU] via SUBCUTANEOUS
  Administered 2012-12-24 (×2): 1 [IU] via SUBCUTANEOUS

## 2012-12-23 MED ORDER — OXYCODONE-ACETAMINOPHEN 5-325 MG PO TABS: 1.0000 | ORAL_TABLET | Freq: Four times a day (QID) | ORAL | Status: DC | PRN

## 2012-12-23 MED ORDER — KETOROLAC TROMETHAMINE 30 MG/ML IJ SOLN
INTRAMUSCULAR | Status: AC
  Filled 2012-12-23: qty 1

## 2012-12-23 MED ORDER — ASPIRIN 325 MG PO TABS
325.0000 mg | ORAL_TABLET | Freq: Every day | ORAL | Status: DC
  Administered 2012-12-23 – 2012-12-25 (×3): 325 mg via ORAL
  Filled 2012-12-23 (×3): qty 1

## 2012-12-23 MED ORDER — GUAIFENESIN-CODEINE 100-10 MG/5ML PO SOLN: 5.0000 mL | ORAL | Status: DC | PRN

## 2012-12-23 MED ORDER — KETOROLAC TROMETHAMINE 30 MG/ML IJ SOLN
30.0000 mg | Freq: Four times a day (QID) | INTRAMUSCULAR | Status: DC | PRN
  Administered 2012-12-23: 30 mg via INTRAVENOUS

## 2012-12-23 NOTE — Progress Notes (Signed)
VASCULAR LAB PRELIMINARY  PRELIMINARY  PRELIMINARY  PRELIMINARY  Right groin ultrasound completed.    Preliminary report:  There is a large pseudoaneurysm measuring 1.7cm X 2cm with a neck measuring 1.04cm at the confluence of the pseudo, noted in the right groin.  Large hematoma noted in right proximal thigh.  Paytin Ramakrishnan, RVT 12/23/2012, 12:58 PM

## 2012-12-23 NOTE — Progress Notes (Signed)
Notified of large pseudoaneurysm of patients R groin after [place].  Laid patient flat and added the 10 lb sand bag on groin at 1315.  Was told to keep patient flat for 4 hours with sandbag on groin and increase HOB starting at 30 degrees after the 4 hour period ends.    When laying sand bag on groin pt yelled out in pain.  Notified stroke MD that pt needed something else for pain other than tylenol.  Received order for Toradol. Will continue to monitor

## 2012-12-23 NOTE — Progress Notes (Signed)
Subjective: Pt doing well neurologically; still has some intermittent left retro-orbital pain but improved; awaiting d/c home today when call received from floor regarding pt's rt groin pain; pt states pain worsened yesterday after walking. Pt has also had occ coughing spells which may have exacerbated bleeding at rt groin site.  Objective: Vital signs in last 24 hours: Temp:  [97.7 F (36.5 C)-98.8 F (37.1 C)] 98.1 F (36.7 C) (01/18 1001) Pulse Rate:  [62-90] 76  (01/18 1001) Resp:  [18-20] 20  (01/18 1001) BP: (100-109)/(48-75) 100/51 mmHg (01/18 1001) SpO2:  [95 %-98 %] 98 % (01/18 1001) Last BM Date: 12/22/12  Intake/Output from previous day: 01/17 0701 - 01/18 0700 In: 720 [P.O.:720] Out: -  Intake/Output this shift:    Pt awake/alert; family in room; neuro exam stable; rt groin ecchymotic and firm with mod tenderness to palpation; LE sens fxn intact, foot warm.  Lab Results:   Basename 12/23/12 0530 12/21/12 0500  WBC 6.7 7.5  HGB 11.2* 12.6*  HCT 31.9* 36.9*  PLT 143* 130*   BMET  Basename 12/21/12 0500  NA 143  K 3.5  CL 108  CO2 25  GLUCOSE 97  BUN 10  CREATININE 0.67  CALCIUM 8.1*   PT/INR No results found for this basename: LABPROT:2,INR:2 in the last 72 hours ABG No results found for this basename: PHART:2,PCO2:2,PO2:2,HCO3:2 in the last 72 hours  Studies/Results: Dg Swallowing Func-speech Pathology  12/21/2012  Carl Wood, CCC-SLP     12/21/2012  1:49 PM Objective Swallowing Evaluation: Modified Barium Swallowing Study   Patient Details  Name: Carl Wood MRN: [id number] Date of Birth: [birth year]  Today's Date: 12/21/2012 Time: 1300-1325 SLP Time Calculation (min): 25 min  Past Medical History:  Past Medical History  Diagnosis Date  . PE (pulmonary embolism)   . Colon polyps   . CHF (congestive heart failure)   . CAD (coronary artery disease)   . Diabetes mellitus   . Diverticulosis of colon   . Hyperlipidemia   . Hypertension   . MI  (mitral incompetence)   . Nephrolithiasis   . V-tach    Past Surgical History:  Past Surgical History  Procedure Date  . Back surgery   . Cardiac defibrillator placement     medtronic virtuoso  . Basal cell carcinoma excision     nose  . Knee arthroplasty   . Cholecystectomy   . Colonoscopy 12/11/2012    Procedure: COLONOSCOPY;  Surgeon: Louis Meckel, MD;   Location: [facility];  Service: Endoscopy;  Laterality: N/A;   HPI:  Carl Wood is an 72 y.o. male history of pulmonary  embolism, atrial fibrillation, on Coumadin, congestive heart  failure, diabetes mellitus, hypertension and hyperlipidemia,  presenting with onset of speech difficulty and right-sided  weakness. Original NIHSS 5 on admission, decreased to 1 in ED  however patient with a relapse of aphasia. CT showed left basal  ganglia CVA. Intubated 1/14-1/15 for airway protection.      Assessment / Plan / Recommendation Clinical Impression  Dysphagia Diagnosis: Mild pharyngeal phase dysphagia Clinical impression: Patient presents with a very mild pharyngeal  phase dysphagia. Swallow initiation timely and patient with full  airway protection with all consistencies tested. Mild vallecular  residuals noted post swallow due to base of tongue weakness which  cleared with therapuetic intervention including verbal and visual  cueing for dry swallow in between bites and sips. Education  complete with patient, wife, and grandaughter regarding  recommended diet, rationale  for swallowing precautions, and  compensatory strategies. All verbalized understanding.     Treatment Recommendation  Therapy as outlined in treatment plan below    Diet Recommendation Regular;Thin liquid   Liquid Administration via: Cup;Straw Medication Administration: Whole meds with liquid Supervision: Patient able to self feed;Intermittent supervision  to cue for compensatory strategies Compensations: Slow rate;Small sips/bites;Multiple dry swallows  after each bite/sip Postural Changes  and/or Swallow Maneuvers: Seated upright 90  degrees    Other  Recommendations Recommended Consults: MBS Oral Care Recommendations: Oral care BID   Follow Up Recommendations   (TBD pending cognitive-linguistic evaluation)    Frequency and Duration min 2x/week  1 week       SLP Swallow Goals Patient will utilize recommended strategies during swallow to  increase swallowing safety with: Modified independent assistance Swallow Study Goal #2 - Progress: Not met   General HPI: Carl Wood is an 72 y.o. male history of  pulmonary embolism, atrial fibrillation, on Coumadin, congestive  heart failure, diabetes mellitus, hypertension and  hyperlipidemia, presenting with onset of speech difficulty and  right-sided weakness. Original NIHSS 5 on admission, decreased to  1 in ED however patient with a relapse of aphasia. CT showed left  basal ganglia CVA. Intubated 1/14-1/15 for airway protection.  Type of Study: Modified Barium Swallowing Study Reason for Referral: Objectively evaluate swallowing function Previous Swallow Assessment: BSE this am 1/16 indicated s/s of  aspiration with need for objective study Diet Prior to this Study: NPO (except ice chips) Temperature Spikes Noted: No Respiratory Status: Supplemental O2 delivered via (comment)  (nasal cannula) History of Recent Intubation: Yes Length of Intubations (days): 1 days Date extubated: 12/21/12 Behavior/Cognition: Alert;Cooperative;Pleasant mood Oral Cavity - Dentition: Adequate natural dentition Oral Motor / Sensory Function: Impaired - see Bedside swallow  eval Self-Feeding Abilities: Able to feed self Patient Positioning: Upright in chair Baseline Vocal Quality: Clear;Low vocal intensity Volitional Cough: Strong Volitional Swallow: Able to elicit Anatomy: Within functional limits Pharyngeal Secretions: Not observed secondary MBS    Reason for Referral Objectively evaluate swallowing function   Oral Phase Oral Preparation/Oral Phase Oral Phase: WFL    Pharyngeal Phase Pharyngeal Phase Pharyngeal Phase: Impaired Pharyngeal - Thin Pharyngeal - Thin Cup: Pharyngeal residue - valleculae;Reduced  tongue base retraction Pharyngeal - Thin Straw: Pharyngeal residue - valleculae;Reduced  tongue base retraction Pharyngeal - Solids Pharyngeal - Puree: Pharyngeal residue - valleculae;Reduced  tongue base retraction Pharyngeal - Mechanical Soft: Pharyngeal residue -  valleculae;Reduced tongue base retraction Pharyngeal - Pill: Within functional limits (provided whole w/  thin liquids, trace thin vallecula residue)  Cervical Esophageal Phase    GO   Ferdinand Lango MA, CCC-SLP [phone]  Cervical Esophageal Phase Cervical Esophageal Phase: [facility]         Wood Carl [name] 12/21/2012, 1:49 PM      Anti-infectives: Anti-infectives     Start     Dose/Rate Route Frequency Ordered Stop   12/22/12 1330   ciprofloxacin (CIPRO) tablet 500 mg        500 mg Oral 2 times daily 12/22/12 1225 12/29/12 0759   12/19/12 1345   vancomycin (VANCOCIN) powder 1,000 mg  Status:  Discontinued        1,000 mg Other  Once 12/19/12 1335 12/19/12 1337   12/19/12 1345   vancomycin (VANCOCIN) IVPB 1000 mg/200 mL premix        1,000 mg 200 mL/hr over 60 Minutes Intravenous  Once 12/19/12 1337 12/19/12 1345  Assessment/Plan: s/p left basal ganglia infarct 1/14 with IA tpa/clot retrieval, revasc left ICA terminus via rt CFA approach; now with mod tender rt groin hematoma. Will check [place] rt groin to r/o pseudoaneurysm today. Above d/w Dr. Corliss Skains and pt/family.  Janaysia Mcleroy,D [facility] 12/23/2012

## 2012-12-23 NOTE — Progress Notes (Signed)
Stroke Team Progress Note  HISTORY Carl Wood is an 72 y.o. male history of pulmonary embolism, atrial fibrillation, on Coumadin, congestive heart failure, diabetes mellitus, hypertension and hyperlipidemia, presenting with onset of speech difficulty and right-sided weakness at 8:45 AM  12/19/2012. Has no previous history of stroke nor TIA. Patient has been on Coumadin long-term Coumadin suspended 2 weeks ago for colonoscopy. Coumadin was restarted on 12/11/2012. INR today was 1.49. Initial CT scan showed no acute intracranial abnormality. Initial NIH stroke score was 5. Patient improved in the emergency room to a stroke score of 1. Thrombolytic therapy with IV TPA was not elected because of the minimal residual neurologic deficits. Shortly before noon patient had a relapse of deficits with expressive aphasia and right hemiplegia. Repeat CT scan showed hypodensity involving the left MCA indicative of intravascular thrombus. Patient was taken to interventional radiology. Cerebral angiography demonstrated thrombosis involving the left internal carotid artery just above the common carotid artery bifurcation and extending into the middle cerebral and anterior cerebral artery branches. Patient subsequently underwent intra-arterial TPA infusion followed by thrombectomy. MCA territory circulation was restored and all branches. Patient had good collateral filling of the left ACA via the anterior communicating artery. He was subsequently admitted to the neuro intensive care unit for further management. He was admitted to the neuro ICU for further evaluation and treatment.  SUBJECTIVE Wife and granddaughter are at the bedside. Feeling better. Urine has cleared. Eye pain improved. Now with right groin pain after significant ambulation yesterday.   OBJECTIVE Most recent Vital Signs: Filed Vitals:   12/22/12 2335 12/23/12 0321 12/23/12 0545 12/23/12 1001  BP: 100/61 100/54 109/59 100/51  Pulse: 90 69 83 76    Temp: 98.8 F (37.1 C) 97.7 F (36.5 C) 98.1 F (36.7 C) 98.1 F (36.7 C)  TempSrc: Oral Oral Oral Oral  Resp: 20 20 20 20   Height:      Weight:      SpO2: 97% 95% 97% 98%   CBG (last 3)   Basename 12/23/12 0406 12/23/12 0017 12/22/12 0509  GLUCAP 107* 109* 105*   IV Fluid Intake:      . sodium chloride 75 mL/hr at 12/21/12 2230   MEDICATIONS     . atorvastatin  80 mg Oral Daily  . carvedilol  6.25 mg Oral BID  . ciprofloxacin  500 mg Oral BID  . enalapril  10 mg Oral BID  . insulin aspart  0-9 Units Subcutaneous Q4H  . rivaroxaban  20 mg Oral Q supper   PRN:  acetaminophen, acetaminophen, labetalol  Diet:  General  Activity:  OOB DVT Prophylaxis:  SCDs   CLINICALLY SIGNIFICANT STUDIES Basic Metabolic Panel:   Lab 12/21/12 0500 12/20/12 0630 12/19/12 2150  NA 143 140 --  K 3.5 3.9 --  CL 108 106 --  CO2 25 24 --  GLUCOSE 97 130* --  BUN 10 8 --  CREATININE 0.67 0.67 --  CALCIUM 8.1* 8.6 --  MG -- -- 1.7  PHOS -- -- --   Liver Function Tests:   Lab 12/19/12 0940  AST 21  ALT 19  ALKPHOS 80  BILITOT 0.6  PROT 7.1  ALBUMIN 3.7   CBC:   Lab 12/23/12 0530 12/21/12 0500 12/20/12 0630  WBC 6.7 7.5 --  NEUTROABS -- 5.6 9.4*  HGB 11.2* 12.6* --  HCT 31.9* 36.9* --  MCV 95.5 96.3 --  PLT 143* 130* --   Coagulation:   Lab 12/19/12 0940  LABPROT 17.6*  INR 1.49   Cardiac Enzymes:   Lab 12/19/12 0940  CKTOTAL --  CKMB --  CKMBINDEX --  TROPONINI <0.30   Urinalysis:   Lab 12/22/12 1106  COLORURINE RED*  LABSPEC 1.035*  PHURINE 5.5  GLUCOSEU NEGATIVE  HGBUR LARGE*  BILIRUBINUR LARGE*  KETONESUR 15*  PROTEINUR 100*  UROBILINOGEN 1.0  NITRITE POSITIVE*  LEUKOCYTESUR MODERATE*   Lipid Panel    Component Value Date/Time   CHOL 113 12/20/2012 0630   TRIG 119 12/20/2012 0630   HDL 28* 12/20/2012 0630   CHOLHDL 4.0 12/20/2012 0630   VLDL 24 12/20/2012 0630   LDLCALC 61 12/20/2012 0630   HgbA1C  Lab Results  Component Value Date    HGBA1C 6.5* 12/20/2012   Urine Drug Screen:   No results found for this basename: labopia,  cocainscrnur,  labbenz,  amphetmu,  thcu,  labbarb    Alcohol Level: No results found for this basename: ETH:2 in the last 168 hours  CT of the brain   12/21/2012 Slight progression of the extent of cytotoxic dema around the previously identified left basal ganglia infarct. No interval  hemorrhage 12/20/2012   Low attenuation change in the left internal capsule and basal ganglia consistent with evolving acute infarct.  No acute intracranial hemorrhage.    12/19/2012 Stable CT appearance of the brain. Continued increased conspicuity of the left MCA M1 segment suggesting thrombus in the setting.  No intracranial hemorrhage or mass effect.   12/19/2012  Question increased density of the left middle cerebral artery suggesting thrombosis.    12/19/2012  No evidence of acute intracranial abnormality  Cerebral Angiogram  12/19/2012 S/P bilateral common carotid arteriogram,followed by complete revascularization of T occlusion of the LT ICA terminus.,using 7.4 mg of IA TPA and the trevoprovue retrieval device.  MRI of the brain  defibrillator  MRA of the brain  See angio  2D Echocardiogram  EF 30-35% with no source of embolus.   Carotid Doppler  See angio  CXR   12/21/2012 Unchanged cardiopulmonary appearance post extubation with pulmonary vascular congestion, probable mild interstitial edema and unchanged right lower lobe atelectasis versus infiltrate 12/20/2012   1.  Stable support apparatus. 2.  Right lower lobe air space disease.   12/19/2012  Endotracheal tube 6 cm above the carina.  Right lower lung pneumonia versus aspiration     EKG  atrial fibrillation, HR 60  Therapy Recommendations OP PT, HH OT  Physical Exam    Middle aged caucasian male Awake alert.  Right groin reveals mild ecchymosis and moderate size hematoma approximately 4 x 6 inches. Fairly tender to palpation. I made an outline with skin  marker. Neurological Exam : Awake alert follows commands well.Voice normal per family now with no aphasia or dysarthria Eye movements full.No face weakness. No drift or focal weakness. No sensory loss .plantars downgoing. NIHSS zero  ASSESSMENT Mr. Carl Wood is a 72 y.o. male presenting with right sided weakness and speech difficulty. He did not receive IV tpa due to rapid improvement. Neurologic worsening in ED led to cerebral angiogram finding of left ICA occlusion. Status post revascularization with IA tPA and the trevo device. Imaging confirms a relatively small left basal ganglia infarct considering he had a ICA occlusion. Infarct felt to be embolic secondary to left ICA occlusion associated with atrial fibrillation.  Work up now completed. On warfarin prior to admission for atrial fibrillation, INR subtherapeutic at 1.49. Now on xarelto 20 mg daily for secondary stroke prevention. Patient with  resultant right hemiparesis, VDRF.   Left eye pain, intermittent responding to tylenol  Hematuria, 14.2 on adm, 12.6 on 12/16 - resolved per patient and wife.   VDRF following neuro intervention, resolved.  Left arm twitch, not felt to be seizure, resolved atrial fibrillation Diabetes, HgbA1c 6.5 Hyperlipidemia, LDL 61, on lipitor 80 PTA, at goal LDL < 100 Hypertension, hypotension over night 88/54 Low EF 30 - 35% Pacer/Cardiac defibrillator, frequent firings prior to admission CAD - MI CHF Low EF 30 - 35% Hx PE Bloody urine, new xarelto - Urine has cleared. Right groin hematoma S/P angio / intervention.  Hospital day # 4  TREATMENT/PLAN  Continue xarelto 20 mg daily for secondary stroke prevention.   OP PT & OT (ordered)  CBC in am - Hb down 1.4 grams.Will need outpatient F/U CBC.  Called and spoke to Dr. Berneice Heinrich, urologist on call. He is not concerned with the hematuria at this time. If it increases, recommends CT to look for kidney stones. Cautioned patient and wife to report  any bleeding.  Called interventional radiology to evaluate hematoma on xarelto.  Possible discharge later today with F/U PT and OT. if cleared by IR  Delton See PA-C [facility] Pager [phone] 12/23/2012, 10:40 AM  I have personally obtained a history, examined the patient, evaluated imaging results, and formulated the assessment and plan of care. I agree with the above.  Delia Heady, MD Medical Director [facility] Pager: [phone] 12/24/2012 12:53 PM

## 2012-12-23 NOTE — Progress Notes (Signed)
Occupational Therapy Treatment Patient Details Name: Carl Wood MRN: [id number] DOB: 1941-09-20 Today's Date: 12/23/2012 Time: 4259-5638 OT Time Calculation (min): 13 min  OT Assessment / Plan / Recommendation Comments on Treatment Session      Follow Up Recommendations       Barriers to Discharge       Equipment Recommendations  3 in 1 bedside comode    Recommendations for Other Services    Frequency Min 2X/week   Plan Discharge plan remains appropriate    Precautions / Restrictions Precautions Precautions: Fall   Pertinent Vitals/Pain 3/10 pain meds given by rn    ADL  Tub/Shower Transfer: Performed;Supervision/safety Tub/Shower Transfer Method: Science writer: Grab bars Transfers/Ambulation Related to ADLs: amb. into adl gym to perform tub transfer and then back to room    OT Diagnosis:    OT Problem List:   OT Treatment Interventions:     OT Goals ADL Goals ADL Goal: Toilet Transfer - Progress: Progressing toward goals  Visit Information  Last OT Received On: 12/23/12 Assistance Needed: +1    Subjective Data  Subjective: "i know i can get in/out of the tub alright"   Prior Functioning       Cognition  Overall Cognitive Status: Appears within functional limits for tasks assessed/performed Arousal/Alertness: Awake/alert Orientation Level: Appears intact for tasks assessed Behavior During Session: [facility] for tasks performed    Mobility  Shoulder Instructions Bed Mobility Bed Mobility: Supine to Sit;Sitting - Scoot to Edge of Bed Supine to Sit: 6: Modified independent (Device/Increase time) Transfers Transfers: Stand to Sit Sit to Stand: 5: Supervision;From bed Stand to Sit: 5: Supervision;With armrests;To chair/3-in-1 Details for Transfer Assistance: supervision for safety due to pt attempting to sit in recliner without body in proper position.        Exercises      Balance Static Sitting Balance Static Sitting -  Balance Support: No upper extremity supported;Feet supported Static Sitting - Level of Assistance: 5: Stand by assistance Static Standing Balance Static Standing - Balance Support: During functional activity;No upper extremity supported Static Standing - Level of Assistance: 5: Stand by assistance   End of Session    GO     Robet Leu 12/23/2012, 9:34 AM

## 2012-12-23 NOTE — Progress Notes (Signed)
Patient complained of pain and discomfort to rt groin/ thigh with bruising and firm to touch. Tylenol 650 mg given at 2210 with moderate relief voiced. Iced pack applied to site and patient voiced relief. MD on call notified via text.

## 2012-12-23 NOTE — Consult Note (Addendum)
[facility] [place] HISTORY AND PHYSICAL  Reason for consult: right femoral pseudoaneurysm Requesting: Deveshwar History of Present Illness:  Patient is a 72 y.o. year old male who presents for evaluation of right femoral pseudoaneurysm.  Pt had percutaneous stroke intervention on MCA 4 days ago.  Acutely began to have pain in right groin today. Ultrasound shows 2 cm right femoral pseudoaneurysm.  Pt is on Xarelto for afib which may have been source of embolus.  Other medical problems include CHF, CAD, DM, hypertension, vtach all of which are currently stable.  Recent ECHO enlarged atrium but no thrombus noted.  Past Medical History  Diagnosis Date  . PE (pulmonary embolism)   . Colon polyps   . CHF (congestive heart failure)   . CAD (coronary artery disease)   . Diabetes mellitus   . Diverticulosis of colon   . Hyperlipidemia   . Hypertension   . MI (mitral incompetence)   . Nephrolithiasis   . V-tach     Past Surgical History  Procedure Date  . Back surgery   . Cardiac defibrillator placement     medtronic virtuoso  . Basal cell carcinoma excision     nose  . Knee arthroplasty   . Cholecystectomy   . Colonoscopy 12/11/2012    Procedure: COLONOSCOPY;  Surgeon: Louis Meckel, MD;  Location: [facility];  Service: Endoscopy;  Laterality: N/A;     Social History History  Substance Use Topics  . Smoking status: Current Every Day Smoker -- 0.5 packs/day    Types: Cigarettes  . Smokeless tobacco: Never Used  . Alcohol Use: No    Family History Family History  Problem Relation Age of Onset  . Colon cancer Father   . Diabetes Father   . Heart disease Father   . Bladder Cancer Mother   . Heart disease Mother     Allergies  Allergies  Allergen Reactions  . Penicillins     REACTION: unspecified  . Sulfamethoxazole     REACTION: unspecified     Current Facility-Administered Medications  Medication Dose Route Frequency Provider Last Rate  Last Dose  . 0.9 %  sodium chloride infusion   Intravenous Continuous Thana Farr, MD 75 mL/hr at 12/21/12 2230    . acetaminophen (TYLENOL) tablet 650 mg  650 mg Oral Q4H PRN Noel Christmas   650 mg at 12/22/12 2210   Or  . acetaminophen (TYLENOL) suppository 650 mg  650 mg Rectal Q4H PRN Noel Christmas      . atorvastatin (LIPITOR) tablet 80 mg  80 mg Oral Daily Layne Benton, NP   80 mg at 12/22/12 1036  . carvedilol (COREG) tablet 6.25 mg  6.25 mg Oral BID Layne Benton, NP   6.25 mg at 12/23/12 1026  . ciprofloxacin (CIPRO) tablet 500 mg  500 mg Oral BID Layne Benton, NP   500 mg at 12/23/12 0806  . enalapril (VASOTEC) tablet 10 mg  10 mg Oral BID Layne Benton, NP   10 mg at 12/23/12 1026  . insulin aspart (novoLOG) injection 0-9 Units  0-9 Units Subcutaneous TID AC & HS Micki Riley, MD   2 Units at 12/23/12 1206  . ketorolac (TORADOL) 30 MG/ML injection 30 mg  30 mg Intravenous Q6H PRN Micki Riley, MD   30 mg at 12/23/12 1338  . ketorolac (TORADOL) 30 MG/ML injection           . labetalol (NORMODYNE,TRANDATE) injection 10 mg  10 mg Intravenous Q10 min PRN Noel Christmas      . Rivaroxaban (XARELTO) tablet 20 mg  20 mg Oral Q supper Layne Benton, NP   20 mg at 12/22/12 1833    ROS:   General:  No weight loss, Fever, chills  Hematologic:No history of hypercoagulable state.  No history of easy bleeding.  No history of anemia  Gastrointestinal: No hematochezia or melena,  No gastroesophageal reflux, no trouble swallowing  Urinary: [ ]  chronic Kidney disease, [ ]  on HD - [ ]  MWF or [ ]  TTHS, [ ]  Burning with urination, [ ]  Frequent urination, [ ]  Difficulty urinating;   Skin: No rashes  Cardiac: no chest pain  Respiratory: no shortness of breath   Physical Examination  Filed Vitals:   12/23/12 0321 12/23/12 0545 12/23/12 1001 12/23/12 1353  BP: 100/54 109/59 100/51 95/65  Pulse: 69 83 76 63  Temp: 97.7 F (36.5 C) 98.1 F (36.7 C) 98.1 F (36.7 C) 98.2 F  (36.8 C)  TempSrc: Oral Oral Oral Oral  Resp: 20 20 20 18   Height:      Weight:      SpO2: 95% 97% 98% 99%    Body mass index is 29.63 kg/(m^2).  General:  Alert and oriented, no acute distress HEENT: Normal Cardiac: Regular Rate and Rhythm without murmur Abdomen: Soft, non-tender, non-distended Skin: No rash, ecchymosis right groin, hematoma approx 5x7 cm no skin compromise Extremity Pulses:  2+ radial, brachial, femoral, absent dorsalis pedis, posterior tibial pulses right leg, foot is warm and pink with brisk cap refill Musculoskeletal: No deformity or edema  Neurologic: Upper and lower extremity motor 5/5 and symmetric  DATA: duplex right groin 2 cm pseudoaneurysm   ASSESSMENT: Pseudoaneurysm right groin.     PLAN:  1.  D/c sandbag on groin as this is very uncomfortable long term and unlikely to fix pseudo 2. Stop xarelto and toradol as these increase bleeding.  Discussed with pt risk and benefit of stopping xarelto,but that if it requires operative repair will need normal coag profile. 3.  Repeat duplex of groin on Monday if not thrombosed spontaneously will consider thrombin injection vs compression.  4.  Aspirin until xarelto can be restarted 5. If continued expansion or skin compromise will need repair in OR. 6. Minimize activity but not strict bedrest.  7.  Guafenisin with codeine to minimize coughing. Will continue to follow   Fabienne Bruns, MD [facility] [place] Office: [phone] Pager: [phone]

## 2012-12-24 LAB — GLUCOSE, CAPILLARY
Glucose-Capillary: 101 mg/dL — ABNORMAL HIGH (ref 70–99)
Glucose-Capillary: 106 mg/dL — ABNORMAL HIGH (ref 70–99)
Glucose-Capillary: 130 mg/dL — ABNORMAL HIGH (ref 70–99)

## 2012-12-24 NOTE — Progress Notes (Signed)
Stroke Team Progress Note  HISTORY Carl Wood is an 72 y.o. male history of pulmonary embolism, atrial fibrillation, on Coumadin, congestive heart failure, diabetes mellitus, hypertension and hyperlipidemia, presenting with onset of speech difficulty and right-sided weakness at 8:45 AM  12/19/2012. Has no previous history of stroke nor TIA. Patient has been on Coumadin long-term Coumadin suspended 2 weeks ago for colonoscopy. Coumadin was restarted on 12/11/2012. INR today was 1.49. Initial CT scan showed no acute intracranial abnormality. Initial NIH stroke score was 5. Patient improved in the emergency room to a stroke score of 1. Thrombolytic therapy with IV TPA was not elected because of the minimal residual neurologic deficits. Shortly before noon patient had a relapse of deficits with expressive aphasia and right hemiplegia. Repeat CT scan showed hypodensity involving the left MCA indicative of intravascular thrombus. Patient was taken to interventional radiology. Cerebral angiography demonstrated thrombosis involving the left internal carotid artery just above the common carotid artery bifurcation and extending into the middle cerebral and anterior cerebral artery branches. Patient subsequently underwent intra-arterial TPA infusion followed by thrombectomy. MCA territory circulation was restored and all branches. Patient had good collateral filling of the left ACA via the anterior communicating artery. He was subsequently admitted to the neuro intensive care unit for further management. He was admitted to the neuro ICU for further evaluation and treatment.  SUBJECTIVE The patient's wife and daughter are currently in the room. The patient states he is doing well other than the problem with his right groin. His ambulation is been limited. He is to be n.p.o. after midnight for intervention tomorrow regarding the femoral pseudoaneurysm repair. There have been no new neurologic changes. These Xarelto  remains on hold. We discussed the pros and cons of Coumadin versus the newer anticoagulants.   OBJECTIVE Most recent Vital Signs: Filed Vitals:   12/23/12 1803 12/23/12 2200 12/24/12 0214 12/24/12 0642  BP: 102/54 103/55 102/57 105/66  Pulse: 63 70 70 82  Temp: 98.5 F (36.9 C) 98.9 F (37.2 C) 98.4 F (36.9 C) 98.2 F (36.8 C)  TempSrc: Oral Oral Oral Oral  Resp: 18 20 20 20   Height:      Weight:      SpO2: 96% 95% 100% 97%   CBG (last 3)   Basename 12/24/12 0646 12/23/12 2301 12/23/12 1707  GLUCAP 101* 110* 104*   IV Fluid Intake:      . sodium chloride 75 mL/hr at 12/21/12 2230   MEDICATIONS     . aspirin  325 mg Oral Daily  . atorvastatin  80 mg Oral Daily  . carvedilol  6.25 mg Oral BID  . ciprofloxacin  500 mg Oral BID  . enalapril  10 mg Oral BID  . insulin aspart  0-9 Units Subcutaneous TID AC & HS   PRN:  acetaminophen, acetaminophen, guaiFENesin-codeine, labetalol, morphine injection, oxyCODONE-acetaminophen  Diet:  General  Activity:  OOB DVT Prophylaxis:  SCDs   CLINICALLY SIGNIFICANT STUDIES Basic Metabolic Panel:   Lab 12/21/12 0500 12/20/12 0630 12/19/12 2150  NA 143 140 --  K 3.5 3.9 --  CL 108 106 --  CO2 25 24 --  GLUCOSE 97 130* --  BUN 10 8 --  CREATININE 0.67 0.67 --  CALCIUM 8.1* 8.6 --  MG -- -- 1.7  PHOS -- -- --   Liver Function Tests:   Lab 12/19/12 0940  AST 21  ALT 19  ALKPHOS 80  BILITOT 0.6  PROT 7.1  ALBUMIN 3.7   CBC:  Lab 12/23/12 0530 12/21/12 0500 12/20/12 0630  WBC 6.7 7.5 --  NEUTROABS -- 5.6 9.4*  HGB 11.2* 12.6* --  HCT 31.9* 36.9* --  MCV 95.5 96.3 --  PLT 143* 130* --   Coagulation:   Lab 12/19/12 0940  LABPROT 17.6*  INR 1.49   Cardiac Enzymes:   Lab 12/19/12 0940  CKTOTAL --  CKMB --  CKMBINDEX --  TROPONINI <0.30   Urinalysis:   Lab 12/22/12 1106  COLORURINE RED*  LABSPEC 1.035*  PHURINE 5.5  GLUCOSEU NEGATIVE  HGBUR LARGE*  BILIRUBINUR LARGE*  KETONESUR 15*  PROTEINUR  100*  UROBILINOGEN 1.0  NITRITE POSITIVE*  LEUKOCYTESUR MODERATE*   Lipid Panel    Component Value Date/Time   CHOL 113 12/20/2012 0630   TRIG 119 12/20/2012 0630   HDL 28* 12/20/2012 0630   CHOLHDL 4.0 12/20/2012 0630   VLDL 24 12/20/2012 0630   LDLCALC 61 12/20/2012 0630   HgbA1C  Lab Results  Component Value Date   HGBA1C 6.5* 12/20/2012   Urine Drug Screen:   No results found for this basename: labopia,  cocainscrnur,  labbenz,  amphetmu,  thcu,  labbarb    Alcohol Level: No results found for this basename: ETH:2 in the last 168 hours  CT of the brain   12/21/2012 Slight progression of the extent of cytotoxic dema around the previously identified left basal ganglia infarct. No interval  hemorrhage 12/20/2012   Low attenuation change in the left internal capsule and basal ganglia consistent with evolving acute infarct.  No acute intracranial hemorrhage.    12/19/2012 Stable CT appearance of the brain. Continued increased conspicuity of the left MCA M1 segment suggesting thrombus in the setting.  No intracranial hemorrhage or mass effect.   12/19/2012  Question increased density of the left middle cerebral artery suggesting thrombosis.    12/19/2012  No evidence of acute intracranial abnormality  Cerebral Angiogram  12/19/2012 S/P bilateral common carotid arteriogram,followed by complete revascularization of T occlusion of the LT ICA terminus.,using 7.4 mg of IA TPA and the trevoprovue retrieval device.  MRI of the brain  defibrillator  MRA of the brain  See angio  2D Echocardiogram  EF 30-35% with no source of embolus.   Carotid Doppler  See angio  CXR   12/21/2012 Unchanged cardiopulmonary appearance post extubation with pulmonary vascular congestion, probable mild interstitial edema and unchanged right lower lobe atelectasis versus infiltrate 12/20/2012   1.  Stable support apparatus. 2.  Right lower lobe air space disease.   12/19/2012  Endotracheal tube 6 cm above the carina.   Right lower lung pneumonia versus aspiration     EKG  atrial fibrillation, HR 60  Therapy Recommendations OP PT, HH OT  Physical Exam    Middle aged caucasian male Awake alert.  Heart irregularly irregular rhythm Lungs clear  Neurological Exam : Awake alert follows commands well.Voice normal per family now with no aphasia or dysarthria Eye movements full.No face weakness. No drift or focal weakness. No sensory loss .plantars downgoing. NIHSS zero  ASSESSMENT Carl Wood is a 72 y.o. male presenting with right sided weakness and speech difficulty. He did not receive IV tpa due to rapid improvement. Neurologic worsening in ED led to cerebral angiogram finding of left ICA occlusion. Status post revascularization with IA tPA and the trevo device. Imaging confirms a relatively small left basal ganglia infarct considering he had a ICA occlusion. Infarct felt to be embolic secondary to left ICA occlusion associated with  atrial fibrillation.  Work up now completed. On warfarin prior to admission for atrial fibrillation, INR subtherapeutic at 1.49. Now on xarelto 20 mg daily for secondary stroke prevention. Patient with resultant right hemiparesis, VDRF.   Left eye pain, intermittent responding to tylenol  Hematuria, 14.2 on adm, 12.6 on 12/16 - resolved per patient and wife.   VDRF following neuro intervention, resolved.  Left arm twitch, not felt to be seizure, resolved atrial fibrillation Diabetes, HgbA1c 6.5 Hyperlipidemia, LDL 61, on lipitor 80 PTA, at goal LDL < 100 Hypertension, hypotension over night 88/54 Low EF 30 - 35% Pacer/Cardiac defibrillator, frequent firings prior to admission CAD - MI CHF Low EF 30 - 35% Hx PE Bloody urine, new xarelto - Urine has cleared. Right groin hematoma S/P angio / intervention.  Hospital day # 5  TREATMENT/PLAN  Continue to hold xarelto.  Repeat ultrasound tomorrow.  The patient will be n.p.o. after midnight for possible right  femoral pseudoaneurysm repair - compression / injection versus surgical repair.  OP PT & OT (ordered)  CBC yesterday - Hb down 1.4 grams..  Dr. Berneice Heinrich, urologist, was contacted regarding hematuria which is now resolved. I cautioned the patient and his wife to report any bleeding problems.  Discharge when stable from vascular surgical standpoint.  Delton See PA-C [facility] Pager [phone] 12/24/2012, 10:00 AM  I have personally obtained a history, examined the patient, evaluated imaging results, and formulated the assessment and plan of care. I agree with the above.  Delia Heady, MD Medical Director [facility] Pager: [phone] 12/24/2012 1:08 PM

## 2012-12-24 NOTE — Progress Notes (Addendum)
  Subjective: Pt doing well neurologically; states pain in rt groin is slightly less today; no new c/o  Objective: Vital signs in last 24 hours: Temp:  [98.2 F (36.8 C)-98.9 F (37.2 C)] 98.2 F (36.8 C) (01/19 1610) Pulse Rate:  [63-82] 82  (01/19 0642) Resp:  [18-20] 20  (01/19 0642) BP: (95-105)/(54-66) 105/66 mmHg (01/19 0642) SpO2:  [95 %-100 %] 97 % (01/19 0642) Last BM Date: 12/21/12  Intake/Output from previous day: 01/18 0701 - 01/19 0700 In: 400 [P.O.:400] Out: 200 [Urine:200] Intake/Output this shift:    Neuro exam unchanged; rt groin ecchymotic with palpable firm hematoma below CFA puncture site, less swollen proximally; mild -mod tenderness noted; right LE sens/motor fxn intact, foot warm.  Lab Results:   Basename 12/23/12 0530  WBC 6.7  HGB 11.2*  HCT 31.9*  PLT 143*   BMET No results found for this basename: NA:2,K:2,CL:2,CO2:2,GLUCOSE:2,BUN:2,CREATININE:2,CALCIUM:2 in the last 72 hours PT/INR No results found for this basename: LABPROT:2,INR:2 in the last 72 hours ABG No results found for this basename: PHART:2,PCO2:2,PO2:2,HCO3:2 in the last 72 hours  Studies/Results: No results found.  Anti-infectives: Anti-infectives     Start     Dose/Rate Route Frequency Ordered Stop   12/22/12 1330   ciprofloxacin (CIPRO) tablet 500 mg        500 mg Oral 2 times daily 12/22/12 1225 12/29/12 0759   12/19/12 1345   vancomycin (VANCOCIN) powder 1,000 mg  Status:  Discontinued        1,000 mg Other  Once 12/19/12 1335 12/19/12 1337   12/19/12 1345   vancomycin (VANCOCIN) IVPB 1000 mg/200 mL premix        1,000 mg 200 mL/hr over 60 Minutes Intravenous  Once 12/19/12 1337 12/19/12 1345          Assessment/Plan: S/p left basal ganglia infarct with IA tpa/clot retrieval, revasc left ICA terminus 1/14 via rt CFA approach; now with pseudoaneurysm rt groin . Appreciate Dr. Darrick Penna' evaluation. Plan is for f/u arterial duplex study on 1/20 with possible  thrombin injection/compression if needed or surgical repair.  LOS: 5 days    Ndia Sampath,D [facility] 12/24/2012

## 2012-12-24 NOTE — Progress Notes (Signed)
[facility]  Subjective  - uneventful night, less sore today  Objective 105/66 82 98.2 F (36.8 C) (Oral) 20 97%  Intake/Output Summary (Last 24 hours) at 12/24/12 0905 Last data filed at 12/24/12 0600  Gross per 24 hour  Intake    400 ml  Output    200 ml  Net    200 ml   Right groin some ecchymosis medially, pulsatile mass palpable today as pt is less tender.  Overall groin appears less swollen  Assessment/Planning: Continue to hold pradaxa Repeat Duplex groin tomorrow with possible injection compression or operative repair.  Will make NPO after breakfast tomorrow (7am)  Chenay Nesmith E 12/24/2012 9:05 AM --  Laboratory Lab Results:  Basename 12/23/12 0530  WBC 6.7  HGB 11.2*  HCT 31.9*  PLT 143*   BMET No results found for this basename: NA:2,K:2,CL:2,CO2:2,GLUCOSE:2,BUN:2,CREATININE:2,CALCIUM:2 in the last 72 hours  COAG Lab Results  Component Value Date   INR 1.49 12/19/2012   INR 2.4 11/07/2012   INR 3.0 10/12/2012   No results found for this basename: PTT    Antibiotics Anti-infectives     Start     Dose/Rate Route Frequency Ordered Stop   12/22/12 1330   ciprofloxacin (CIPRO) tablet 500 mg        500 mg Oral 2 times daily 12/22/12 1225 12/29/12 0759   12/19/12 1345   vancomycin (VANCOCIN) powder 1,000 mg  Status:  Discontinued        1,000 mg Other  Once 12/19/12 1335 12/19/12 1337   12/19/12 1345   vancomycin (VANCOCIN) IVPB 1000 mg/200 mL premix        1,000 mg 200 mL/hr over 60 Minutes Intravenous  Once 12/19/12 1337 12/19/12 1345

## 2012-12-25 DIAGNOSIS — R319 Hematuria, unspecified: Secondary | ICD-10-CM | POA: Diagnosis not present

## 2012-12-25 DIAGNOSIS — I724 Aneurysm of artery of lower extremity: Secondary | ICD-10-CM | POA: Diagnosis not present

## 2012-12-25 DIAGNOSIS — S301XXA Contusion of abdominal wall, initial encounter: Secondary | ICD-10-CM

## 2012-12-25 LAB — GLUCOSE, CAPILLARY
Glucose-Capillary: 119 mg/dL — ABNORMAL HIGH (ref 70–99)
Glucose-Capillary: 121 mg/dL — ABNORMAL HIGH (ref 70–99)

## 2012-12-25 MED ORDER — MORPHINE SULFATE 2 MG/ML IJ SOLN
2.0000 mg | Freq: Once | INTRAMUSCULAR | Status: AC
  Administered 2012-12-25: 2 mg via INTRAVENOUS

## 2012-12-25 MED ORDER — RIVAROXABAN 15 MG PO TABS: 15.0000 mg | ORAL_TABLET | Freq: Every day | ORAL | Status: DC

## 2012-12-25 MED ORDER — CIPROFLOXACIN HCL 500 MG PO TABS: 500.0000 mg | ORAL_TABLET | Freq: Two times a day (BID) | ORAL | Status: DC

## 2012-12-25 NOTE — Progress Notes (Signed)
Per Dr. Darrick Penna, ok to discharge home and resume xarelto today if pseudoaneurym remains compressed at follow up US scheduled for ~4p.  [place] to do scan and document results. I have spoken to [place], RN for pt today to let her know that i will be placing the discharge order and if the pseudoaneurysm remains compressed, pt ok for discharge home. If not, will continue hospitalization.  Annie Main, MSN, RN, ANVP-BC, ANP-BC, [facility] Pager: [phone] 12/25/2012 3:25 PM  I have personally obtained a history, examined the patient, evaluated imaging results, and formulated the assessment and plan of care. I agree with the above.

## 2012-12-25 NOTE — Progress Notes (Addendum)
[facility]  Once the ultrasound has confirmed the right femoral pseudoaneurysm has been compressed he can start on his xarelto today prior to discharge home.    COLLINS, EMMA MAUREEN PA-C  Apparently successful compression of pseudoaneurysm.  Follow up study later today if no flow it will be ok to resume Pradaxa  Fabienne Bruns, MD [facility] [place] Office: [phone] Pager: [phone]

## 2012-12-25 NOTE — Progress Notes (Signed)
Patient up ambulating in hall, for u/s at 4 pm.

## 2012-12-25 NOTE — Progress Notes (Signed)
  Subjective: Cerebral angio with tpa and clot retrieval 1/14 Was awaiting dc 1/18 when complained of rt groin pain US showed + femoral artery pseudoaneurysm eval by Dr Darrick Penna For repeat US today; poss surgery Pt says Rt groin pain is same No worse but not better today Neuro intact  Objective: Vital signs in last 24 hours: Temp:  [98 F (36.7 C)-99.2 F (37.3 C)] 98.5 F (36.9 C) (01/20 4098) Pulse Rate:  [72-84] 84  (01/20 0632) Resp:  [18-20] 20  (01/20 1191) BP: (90-113)/(47-58) 112/56 mmHg (01/20 0632) SpO2:  [95 %-98 %] 95 % (01/20 4782) Last BM Date: 12/21/12  Intake/Output from previous day: 01/19 0701 - 01/20 0700 In: -  Out: 675 [Urine:675] Intake/Output this shift:    PE:  Afeb; vss Rt groin still painful at ecchymotic area beneath angio "stick" site Fullness at groin site has diminished as per markings But pain remains 2+ pulses at rt foot H/H trending down For repeat US today   Lab Results:   Basename 12/23/12 0530  WBC 6.7  HGB 11.2*  HCT 31.9*  PLT 143*   BMET No results found for this basename: NA:2,K:2,CL:2,CO2:2,GLUCOSE:2,BUN:2,CREATININE:2,CALCIUM:2 in the last 72 hours PT/INR No results found for this basename: LABPROT:2,INR:2 in the last 72 hours ABG No results found for this basename: PHART:2,PCO2:2,PO2:2,HCO3:2 in the last 72 hours  Studies/Results: No results found.  Anti-infectives: Anti-infectives     Start     Dose/Rate Route Frequency Ordered Stop   12/22/12 1330   ciprofloxacin (CIPRO) tablet 500 mg        500 mg Oral 2 times daily 12/22/12 1225 12/29/12 0759   12/19/12 1345   vancomycin (VANCOCIN) powder 1,000 mg  Status:  Discontinued        1,000 mg Other  Once 12/19/12 1335 12/19/12 1337   12/19/12 1345   vancomycin (VANCOCIN) IVPB 1000 mg/200 mL premix        1,000 mg 200 mL/hr over 60 Minutes Intravenous  Once 12/19/12 1337 12/19/12 1345          Assessment/Plan: s/p CVA IA tpa and clot retrieval 1/14 in  IR with Dr Corliss Skains Painful rt groin 1/18 am US shows + pseudoaneurysm eval by Vasc Plan: re check [place] today May need surgery Will follow    LOS: 6 days    Carl Wood A 12/25/2012

## 2012-12-25 NOTE — Progress Notes (Signed)
PT Cancellation Note  Patient Details Name: ONOFRE GAINS MRN: [id number] DOB: Feb 16, 1941   Cancelled Treatment:    Reason Eval/Treat Not Completed: Patient at procedure or test/unavailable. Pt undergoing pseudoaneurysm compression in room. Per RN pt to be on strict bed rest the rest of the day. PT to return when appropriate and able.   Marcene Brawn 12/25/2012, 11:18 AM

## 2012-12-25 NOTE — Progress Notes (Signed)
Stroke Team Progress Note  HISTORY Carl Wood is an 72 y.o. male history of pulmonary embolism, atrial fibrillation, on Coumadin, congestive heart failure, diabetes mellitus, hypertension and hyperlipidemia, presenting with onset of speech difficulty and right-sided weakness at 8:45 AM  12/19/2012. Has no previous history of stroke nor TIA. Patient has been on Coumadin long-term Coumadin suspended 2 weeks ago for colonoscopy. Coumadin was restarted on 12/11/2012. INR today was 1.49. Initial CT scan showed no acute intracranial abnormality. Initial NIH stroke score was 5. Patient improved in the emergency room to a stroke score of 1. Thrombolytic therapy with IV TPA was not elected because of the minimal residual neurologic deficits. Shortly before noon patient had a relapse of deficits with expressive aphasia and right hemiplegia. Repeat CT scan showed hypodensity involving the left MCA indicative of intravascular thrombus. Patient was taken to interventional radiology. Cerebral angiography demonstrated thrombosis involving the left internal carotid artery just above the common carotid artery bifurcation and extending into the middle cerebral and anterior cerebral artery branches. Patient subsequently underwent intra-arterial TPA infusion followed by thrombectomy. MCA territory circulation was restored and all branches. Patient had good collateral filling of the left ACA via the anterior communicating artery. He was subsequently admitted to the neuro intensive care unit for further management. He was admitted to the neuro ICU for further evaluation and treatment.  SUBJECTIVE Pt just received femoral [place]. Pressure applied and bleeding stopped. Plan to check again in 4 hrs. If bleeding continues to be stopped, ok for discharge home.  OBJECTIVE Most recent Vital Signs: Filed Vitals:   12/25/12 1031 12/25/12 1120 12/25/12 1127 12/25/12 1140  BP: 109/63 104/53 127/80 106/57  Pulse: 82 77 90 83  Temp:  99.4 F (37.4 C)     TempSrc: Oral     Resp: 18     Height:      Weight:      SpO2: 95% 97% 98% 99%   CBG (last 3)   Basename 12/25/12 0634 12/24/12 2151 12/24/12 1636  GLUCAP 105* 137* 130*   IV Fluid Intake:     . sodium chloride 75 mL/hr at 12/21/12 2230   MEDICATIONS    . aspirin  325 mg Oral Daily  . atorvastatin  80 mg Oral Daily  . carvedilol  6.25 mg Oral BID  . ciprofloxacin  500 mg Oral BID  . enalapril  10 mg Oral BID  . insulin aspart  0-9 Units Subcutaneous TID AC & HS   PRN:  acetaminophen, acetaminophen, guaiFENesin-codeine, labetalol, morphine injection, oxyCODONE-acetaminophen  Diet:  NPO  Activity:  OOB DVT Prophylaxis:  SCDs   CLINICALLY SIGNIFICANT STUDIES Basic Metabolic Panel:   Lab 12/21/12 0500 12/20/12 0630 12/19/12 2150  NA 143 140 --  K 3.5 3.9 --  CL 108 106 --  CO2 25 24 --  GLUCOSE 97 130* --  BUN 10 8 --  CREATININE 0.67 0.67 --  CALCIUM 8.1* 8.6 --  MG -- -- 1.7  PHOS -- -- --   Liver Function Tests:   Lab 12/19/12 0940  AST 21  ALT 19  ALKPHOS 80  BILITOT 0.6  PROT 7.1  ALBUMIN 3.7   CBC:   Lab 12/23/12 0530 12/21/12 0500 12/20/12 0630  WBC 6.7 7.5 --  NEUTROABS -- 5.6 9.4*  HGB 11.2* 12.6* --  HCT 31.9* 36.9* --  MCV 95.5 96.3 --  PLT 143* 130* --   Coagulation:   Lab 12/19/12 0940  LABPROT 17.6*  INR 1.49  Cardiac Enzymes:   Lab 12/19/12 0940  CKTOTAL --  CKMB --  CKMBINDEX --  TROPONINI <0.30   Urinalysis:   Lab 12/22/12 1106  COLORURINE RED*  LABSPEC 1.035*  PHURINE 5.5  GLUCOSEU NEGATIVE  HGBUR LARGE*  BILIRUBINUR LARGE*  KETONESUR 15*  PROTEINUR 100*  UROBILINOGEN 1.0  NITRITE POSITIVE*  LEUKOCYTESUR MODERATE*   Lipid Panel    Component Value Date/Time   CHOL 113 12/20/2012 0630   TRIG 119 12/20/2012 0630   HDL 28* 12/20/2012 0630   CHOLHDL 4.0 12/20/2012 0630   VLDL 24 12/20/2012 0630   LDLCALC 61 12/20/2012 0630   HgbA1C  Lab Results  Component Value Date   HGBA1C 6.5*  12/20/2012   Urine Drug Screen:   No results found for this basename: labopia,  cocainscrnur,  labbenz,  amphetmu,  thcu,  labbarb    Alcohol Level: No results found for this basename: ETH:2 in the last 168 hours  CT of the brain   12/21/2012 Slight progression of the extent of cytotoxic dema around the previously identified left basal ganglia infarct. No interval  hemorrhage 12/20/2012   Low attenuation change in the left internal capsule and basal ganglia consistent with evolving acute infarct.  No acute intracranial hemorrhage.    12/19/2012 Stable CT appearance of the brain. Continued increased conspicuity of the left MCA M1 segment suggesting thrombus in the setting.  No intracranial hemorrhage or mass effect.   12/19/2012  Question increased density of the left middle cerebral artery suggesting thrombosis.    12/19/2012  No evidence of acute intracranial abnormality  Cerebral Angiogram  12/19/2012 S/P bilateral common carotid arteriogram,followed by complete revascularization of T occlusion of the LT ICA terminus.,using 7.4 mg of IA TPA and the trevoprovue retrieval device.  MRI of the brain  defibrillator  MRA of the brain  See angio  2D Echocardiogram  EF 30-35% with no source of embolus.   Carotid Doppler  See angio  CXR   12/21/2012 Unchanged cardiopulmonary appearance post extubation with pulmonary vascular congestion, probable mild interstitial edema and unchanged right lower lobe atelectasis versus infiltrate 12/20/2012   1.  Stable support apparatus. 2.  Right lower lobe air space disease.   12/19/2012  Endotracheal tube 6 cm above the carina.  Right lower lung pneumonia versus aspiration     EKG  atrial fibrillation, HR 60  Therapy Recommendations OP PT, HH OT  Physical Exam    Middle aged caucasian male Awake alert.  Heart irregularly irregular rhythm Lungs clear  Neurological Exam : Awake alert follows commands well.Voice normal per family now with no aphasia or  dysarthria Eye movements full.No face weakness. No drift or focal weakness. No sensory loss .plantars downgoing. NIHSS zero  ASSESSMENT Mr. Carl Wood is a 72 y.o. male presenting with right sided weakness and speech difficulty. He did not receive IV tpa due to rapid improvement. Neurologic worsening in ED led to cerebral angiogram finding of left ICA occlusion. Status post revascularization with IA tPA and the trevo device. Imaging confirms a relatively small left basal ganglia infarct considering he had a ICA occlusion. Infarct felt to be embolic secondary to left ICA occlusion associated with atrial fibrillation.  Work up now completed. On warfarin prior to admission for atrial fibrillation, INR subtherapeutic at 1.49. Now on xarelto 20 mg daily for secondary stroke prevention. Patient with resultant right hemiparesis, VDRF.   Left eye pain, intermittent responding to tylenol, resolved in hospital  Hematuria, resolved per patient and  wife.   VDRF following neuro intervention, resolved.  Left arm twitch, not felt to be seizure, resolved atrial fibrillation Diabetes, HgbA1c 6.5 Hyperlipidemia, LDL 61, on lipitor 80 PTA, at goal LDL < 100 Hypertension Pacer/Cardiac defibrillator, frequent firings prior to admission. Evaluated by company in hospital. CAD - MI CHF Low EF 30 - 35% Hx PE Right groin hematoma S/P angio / intervention. Currently not bleeding  Hospital day # 6  TREATMENT/PLAN  Continue to hold xarelto. Resume per [facility]VS recommendations  Repeat ultrasound in 4 hr. If no bleeding, ok for discharge  OP PT & OT (ordered Friday)  SHARON BIBY, MSN, RN, ANVP-BC, ANP-BC, [facility] Pager: [phone] 12/25/2012 3:13 PM  I have personally obtained a history, examined the patient, evaluated imaging results, and formulated the assessment and plan of care. I agree with the above.  Delia Heady, MD Medical Director [facility] Pager:  [phone] 12/25/2012 11:46 AM

## 2012-12-25 NOTE — Progress Notes (Signed)
Patient discharged home, discharge instruction given per MD order.  Patient discharge post duplex studies as per MD order. Copy of all discharge instruction given and explained.

## 2012-12-25 NOTE — Progress Notes (Signed)
Pseudoaneurysm recheck done 4:41 PM No apparent flow noted within. Successful compression.  Farrel Demark, RDMS, RVT 12/25/2012

## 2012-12-29 ENCOUNTER — Telehealth: Payer: Self-pay | Admitting: Internal Medicine

## 2012-12-29 MED ORDER — TRAMADOL HCL 50 MG PO TABS: 50.0000 mg | ORAL_TABLET | Freq: Two times a day (BID) | ORAL | Status: DC | PRN

## 2012-12-29 NOTE — Telephone Encounter (Signed)
Patient Information:  Caller Name: Carl Wood  Phone: [phone]  Patient: Carl Wood, Carl Wood  Gender: Male  DOB: September 09, 1941  Age: 72 Years  PCP: Birdie Sons (Adults only)  Office Follow Up:  Does the office need to follow up with this patient?: Yes  Instructions For The Office: Does pt need to be seen in the office or can just a pain med stronger than ES Tylenol be called in?   Symptoms  Reason For Call & Symptoms: He had a CVA 12/19/12 and was D/C'd  from  the hospital 12/25/12 PM.  Pt had a  R fermoral  side cath  and also had a Pseudoanuerysm  at the femoral site and had  a doppler U/A  done 12/25/12.   He had presssue bandages appled  to this site and it resolved.  He is very sensitive to pain meds and he is having a lot of pain at the femoral site.   He has tried ES Tylenol, but it is not helping with the pain.   Is there something a little stronger he could have?  He was given no pain meds on discharge from the hospital.  Having sharp pain  in the inner thigh area when he gets up and tries to stand.  Reviewed Health History In EMR: Yes  Reviewed Medications In EMR: Yes  Reviewed Allergies In EMR: Yes  Reviewed Surgeries / Procedures: Yes  Date of Onset of Symptoms: 12/21/2012  Treatments Tried: ES Tylenol  Treatments Tried Worked: No  Guideline(s) Used:  Leg Pain  Disposition Per Guideline:   Go to ED Now (or to Office with PCP Approval)  Reason For Disposition Reached:   Recent illness requiring prolonged bed rest (immobilization)  Advice Given:  N/A  RN Overrode Recommendation:  Make Appointment  Pt does not want to go back to the hospital and when asked if he needs to be seen in the office today, can some one bring him, he stated perhaps.

## 2012-12-29 NOTE — Telephone Encounter (Signed)
I suggest he try tramadol 50 mg # 30 one po bid prn. No RF.  He can take with 325 mg of tylenol.

## 2012-12-29 NOTE — Telephone Encounter (Signed)
Pt aware, rx sent in to [facility]

## 2013-01-02 ENCOUNTER — Ambulatory Visit: Payer: Self-pay | Admitting: Family

## 2013-01-02 ENCOUNTER — Telehealth: Payer: Self-pay | Admitting: Family

## 2013-01-02 DIAGNOSIS — I509 Heart failure, unspecified: Secondary | ICD-10-CM

## 2013-01-02 DIAGNOSIS — Z86718 Personal history of other venous thrombosis and embolism: Secondary | ICD-10-CM

## 2013-01-02 NOTE — Telephone Encounter (Signed)
Pt's coumadin was switched to Xarelto when he was in hospital

## 2013-01-02 NOTE — Patient Instructions (Signed)
Patient was in the hospital and switched to Xarelto from Coumadin

## 2013-01-02 NOTE — Telephone Encounter (Signed)
Needs an INR done asap.

## 2013-01-22 ENCOUNTER — Telehealth (HOSPITAL_COMMUNITY): Payer: Self-pay | Admitting: *Deleted

## 2013-01-24 ENCOUNTER — Ambulatory Visit (INDEPENDENT_AMBULATORY_CARE_PROVIDER_SITE_OTHER): Payer: Medicare Other | Admitting: Internal Medicine

## 2013-01-24 ENCOUNTER — Encounter: Payer: Self-pay | Admitting: Internal Medicine

## 2013-01-24 VITALS — BP 112/64 | HR 72 | Temp 98.2°F | Wt 196.0 lb

## 2013-01-24 DIAGNOSIS — I10 Essential (primary) hypertension: Secondary | ICD-10-CM

## 2013-01-24 DIAGNOSIS — I6350 Cerebral infarction due to unspecified occlusion or stenosis of unspecified cerebral artery: Secondary | ICD-10-CM

## 2013-01-24 DIAGNOSIS — I639 Cerebral infarction, unspecified: Secondary | ICD-10-CM

## 2013-01-24 DIAGNOSIS — E119 Type 2 diabetes mellitus without complications: Secondary | ICD-10-CM

## 2013-01-24 NOTE — Progress Notes (Signed)
Patient ID: Carl Wood, male   DOB: December 23, 1940, 72 y.o.   MRN: [id number] Recent Stroke- TPA with several complications Very complicated story-recent hospitalization  Pseudoaneurysm- reviewed history and plan.  Gets lightheaded at times-but not necessarily with standing. He denies that lightheadedness occurs when standing. He has been fatigued and inactive  Reviewed hospital records,  Reviewed meds, psh, shoc hx,   Discussed all above with family   patient denies chest pain, shortness of breath, orthopnea. Denies lower extremity edema, abdominal pain, change in appetite, change in bowel movements. Patient denies rashes, musculoskeletal complaints. No other specific complaints in a complete review of systems.     well-developed well-nourished male in no acute distress. HEENT exam atraumatic, normocephalic, neck supple without jugular venous distention. Chest clear to auscultation cardiac exam S1-S2 are regular. Abdominal exam overweight with bowel sounds, soft and nontender. Extremities no edema. Neurologic exam is alert with a normal gait.

## 2013-01-25 NOTE — Assessment & Plan Note (Signed)
Lab Results  Component Value Date   HGBA1C 6.5* 12/20/2012   Fair control-- no need to further evaluate at this time

## 2013-01-25 NOTE — Assessment & Plan Note (Signed)
bp here is well controlled Continue same meds- doubt that fatigue is caused by meds

## 2013-01-25 NOTE — Assessment & Plan Note (Signed)
S/p tpa Reviewed imaging tests Needs continued risk factor modification

## 2013-01-29 ENCOUNTER — Other Ambulatory Visit (HOSPITAL_COMMUNITY): Payer: Self-pay | Admitting: Interventional Radiology

## 2013-01-29 DIAGNOSIS — I639 Cerebral infarction, unspecified: Secondary | ICD-10-CM

## 2013-02-07 ENCOUNTER — Ambulatory Visit (HOSPITAL_COMMUNITY)
Admission: RE | Admit: 2013-02-07 | Discharge: 2013-02-07 | Disposition: A | Discharge status: Home / Self Care | Payer: Medicare Other | Source: Ambulatory Visit | Attending: Interventional Radiology | Admitting: Interventional Radiology

## 2013-02-07 DIAGNOSIS — I639 Cerebral infarction, unspecified: Secondary | ICD-10-CM

## 2013-02-15 ENCOUNTER — Ambulatory Visit: Payer: Medicare Other | Admitting: Internal Medicine

## 2013-02-28 ENCOUNTER — Encounter: Payer: Self-pay | Admitting: Internal Medicine

## 2013-02-28 ENCOUNTER — Ambulatory Visit (INDEPENDENT_AMBULATORY_CARE_PROVIDER_SITE_OTHER): Payer: Medicare Other | Admitting: Internal Medicine

## 2013-02-28 ENCOUNTER — Encounter: Payer: Medicare Other | Admitting: Internal Medicine

## 2013-02-28 VITALS — BP 100/54 | HR 58 | Temp 97.9°F | Wt 201.0 lb

## 2013-02-28 DIAGNOSIS — I2510 Atherosclerotic heart disease of native coronary artery without angina pectoris: Secondary | ICD-10-CM

## 2013-02-28 DIAGNOSIS — I6350 Cerebral infarction due to unspecified occlusion or stenosis of unspecified cerebral artery: Secondary | ICD-10-CM

## 2013-02-28 DIAGNOSIS — E119 Type 2 diabetes mellitus without complications: Secondary | ICD-10-CM

## 2013-02-28 DIAGNOSIS — I639 Cerebral infarction, unspecified: Secondary | ICD-10-CM

## 2013-02-28 NOTE — Assessment & Plan Note (Signed)
Has recovered remarkably well Has f/u with Intervent. Radiology

## 2013-02-28 NOTE — Progress Notes (Signed)
Patient ID: Carl Wood, male   DOB: 1941/09/20, 72 y.o.   MRN: [id number]  Stroke-- reviewed hospital notes  CAD-- no sxs. He is trying to be active (more physical activity).  htn-- tolerating meds  Reviewed pmh, psh, sochx   patient denies chest pain, shortness of breath, orthopnea. Denies lower extremity edema, abdominal pain, change in appetite, change in bowel movements. Patient denies rashes, musculoskeletal complaints. No other specific complaints in a complete review of systems.    well-developed well-nourished male in no acute distress. HEENT exam atraumatic, normocephalic, neck supple without jugular venous distention. Chest clear to auscultation cardiac exam S1-S2 are regular. Abdominal exam overweight with bowel sounds, soft and nontender. Extremities no edema. Neurologic exam : speech is somewhat deliberate

## 2013-03-01 NOTE — Assessment & Plan Note (Signed)
Patient is not having any symptoms. We'll continue current risk factor modification.

## 2013-03-01 NOTE — Assessment & Plan Note (Signed)
Last A1c was adequately controlled. He is not on any antihypertensive medications at this time.

## 2013-03-06 ENCOUNTER — Encounter: Payer: Self-pay | Admitting: Internal Medicine

## 2013-03-06 ENCOUNTER — Ambulatory Visit (INDEPENDENT_AMBULATORY_CARE_PROVIDER_SITE_OTHER): Payer: Medicare Other | Admitting: Internal Medicine

## 2013-03-06 VITALS — BP 132/84 | HR 61 | Ht 68.0 in | Wt 197.0 lb

## 2013-03-06 DIAGNOSIS — I4891 Unspecified atrial fibrillation: Secondary | ICD-10-CM

## 2013-03-06 DIAGNOSIS — I2589 Other forms of chronic ischemic heart disease: Secondary | ICD-10-CM

## 2013-03-06 DIAGNOSIS — I4729 Other ventricular tachycardia: Secondary | ICD-10-CM

## 2013-03-06 DIAGNOSIS — Z95810 Presence of automatic (implantable) cardiac defibrillator: Secondary | ICD-10-CM

## 2013-03-06 DIAGNOSIS — I509 Heart failure, unspecified: Secondary | ICD-10-CM

## 2013-03-06 DIAGNOSIS — I472 Ventricular tachycardia: Secondary | ICD-10-CM

## 2013-03-06 LAB — ICD DEVICE OBSERVATION
BATTERY VOLTAGE: 3.07 V
BRDY-0002RV: 40 {beats}/min
CHARGE TIME: 9.879 s
HV IMPEDENCE: 74 Ohm
RV LEAD AMPLITUDE: 9.1 mv
RV LEAD IMPEDENCE ICD: 472 Ohm
RV LEAD THRESHOLD: 1 V
TOT-0006: 20090817000000
TZAT-0001SLOWVT: 1
TZAT-0002FASTVT: NEGATIVE
TZAT-0005SLOWVT: 91 pct
TZAT-0011SLOWVT: 10 ms
TZAT-0011SLOWVT: 10 ms
TZAT-0013SLOWVT: 1
TZAT-0013SLOWVT: 1
TZAT-0018SLOWVT: NEGATIVE
TZAT-0018SLOWVT: NEGATIVE
TZAT-0019SLOWVT: 8 V
TZAT-0019SLOWVT: 8 V
TZON-0004SLOWVT: 28
TZON-0005SLOWVT: 20
TZST-0001FASTVT: 3
TZST-0001FASTVT: 5
TZST-0001SLOWVT: 4
TZST-0001SLOWVT: 6
TZST-0002FASTVT: NEGATIVE
TZST-0002FASTVT: NEGATIVE
TZST-0002FASTVT: NEGATIVE
TZST-0003SLOWVT: 35 J
TZST-0003SLOWVT: 35 J

## 2013-03-06 MED ORDER — LOSARTAN POTASSIUM 50 MG PO TABS: 50.0000 mg | ORAL_TABLET | Freq: Every day | ORAL | Status: DC

## 2013-03-06 NOTE — Assessment & Plan Note (Signed)
Permanent on coumadin

## 2013-03-06 NOTE — Progress Notes (Signed)
.   Patient Care Team: Lindley Magnus, MD as PCP - General   HPI  Carl Wood is a 72 y.o. male is seen in followup for ischemic heart disease with  ICD implanted for ventricular tachycardia. He has history of bypass grafting and depressed left ventricular function. He has a history of recurrent ventricular tachycardia--most recently polymorphic in september 2101 associated with low-normal magnesium.  He underwent a Myoview scanning demonstrated ejection fraction of 27% without ischemia but with a large LAD infarct. He has permanent atrial fibrillation.  The patient denies SOB, Exertional chest pain, edema or palpitations    He had a stroke in Jan from which he has making a recovery   Past Medical History  Diagnosis Date  . PE (pulmonary embolism)   . Colon polyps   . CHF (congestive heart failure)   . CAD (coronary artery disease)   . Diabetes mellitus   . Diverticulosis of colon   . Hyperlipidemia   . Hypertension   . MI (mitral incompetence)   . Nephrolithiasis   . V-tach     Past Surgical History  Procedure Laterality Date  . Back surgery    . Cardiac defibrillator placement      medtronic virtuoso  . Basal cell carcinoma excision      nose  . Knee arthroplasty    . Cholecystectomy    . Colonoscopy  12/11/2012    Procedure: COLONOSCOPY;  Surgeon: Louis Meckel, MD;  Location: [facility];  Service: Endoscopy;  Laterality: N/A;    Current Outpatient Prescriptions  Medication Sig Dispense Refill  . atorvastatin (LIPITOR) 80 MG tablet Take 80 mg by mouth daily.      . carvedilol (COREG) 6.25 MG tablet Take 6.25 mg by mouth 2 (two) times daily.      . enalapril (VASOTEC) 10 MG tablet Take 10 mg by mouth 2 (two) times daily.      . fish oil-omega-3 fatty acids 1000 MG capsule Take 2 g by mouth daily.       Marland Kitchen LANOXIN 0.25 MG tablet Take 0.5 tablets (0.125 mg total) by mouth daily.  90 each  0  . mometasone (NASONEX) 50 MCG/ACT nasal spray Place 1 spray into the  nose daily.  17 g  12  . Rivaroxaban (XARELTO) 20 MG TABS Take 20 mg by mouth daily.       No current facility-administered medications for this visit.    Allergies  Allergen Reactions  . Penicillins     REACTION: unspecified  . Sulfamethoxazole     REACTION: unspecified    Review of Systems negative except from HPI and PMH  Physical Exam BP 132/84  Pulse 61  Ht 5\' 8"  (1.727 m)  Wt 197 lb (89.359 kg)  BMI 29.96 kg/m2 Well developed and well nourished in no acute distress HENT normal E scleral and icterus clear Neck Supple JVP flat; carotids brisk and full Clear to ausculation  Regular rate and rhythm, no murmurs gallops or rub Soft with active bowel sounds No clubbing cyanosis none Edema Alert and oriented, grossly normal motor and sensory function Skin Warm and Dry    Assessment and  Plan

## 2013-03-06 NOTE — Patient Instructions (Addendum)
Your physician has recommended you make the following change in your medication:  1) Stop enalapril (vasotec) 2) Start losartan (cozaar) 50 mg one tablet by mouth once daily  Your physician recommends that you schedule a follow-up appointment in: 3 months with Kristin/ Gunnar Fusi for a device check.  Your physician wants you to follow-up in: 1 year with [name]. You will receive a reminder letter in the mail two months in advance. If you don't receive a letter, please call our office to schedule the follow-up appointment.

## 2013-03-06 NOTE — Assessment & Plan Note (Signed)
No recurrent atrial fibrillation.

## 2013-03-06 NOTE — Assessment & Plan Note (Signed)
The patient's device was interrogated.  The information was reviewed. No changes were made in the programming.    

## 2013-03-06 NOTE — Assessment & Plan Note (Signed)
stable °

## 2013-03-12 ENCOUNTER — Other Ambulatory Visit: Payer: Self-pay | Admitting: *Deleted

## 2013-03-28 ENCOUNTER — Telehealth: Payer: Self-pay | Admitting: Internal Medicine

## 2013-03-28 MED ORDER — RIVAROXABAN 20 MG PO TABS: 20.0000 mg | ORAL_TABLET | Freq: Every day | ORAL | Status: DC

## 2013-03-28 NOTE — Telephone Encounter (Signed)
rx sent in electronically 

## 2013-03-28 NOTE — Telephone Encounter (Signed)
Pt needs refill of Rivaroxaban (XARELTO) 20 MG TABS. This is a new med pt was put on at his last hospital admit, so it is a new script.  Pt did not realize this could not be refilled and is completey out and needs med today!  [facility] [employer].

## 2013-04-02 ENCOUNTER — Ambulatory Visit (INDEPENDENT_AMBULATORY_CARE_PROVIDER_SITE_OTHER): Payer: Medicare Other | Admitting: Family Medicine

## 2013-04-02 ENCOUNTER — Encounter: Payer: Self-pay | Admitting: Family Medicine

## 2013-04-02 ENCOUNTER — Telehealth: Payer: Self-pay

## 2013-04-02 VITALS — BP 100/60 | Temp 98.0°F | Wt 198.0 lb

## 2013-04-02 DIAGNOSIS — R319 Hematuria, unspecified: Secondary | ICD-10-CM

## 2013-04-02 LAB — CBC WITH DIFFERENTIAL/PLATELET
Basophils Relative: 0.3 % (ref 0.0–3.0)
Eosinophils Relative: 2.5 % (ref 0.0–5.0)
Hemoglobin: 14.8 g/dL (ref 13.0–17.0)
Lymphocytes Relative: 24.4 % (ref 12.0–46.0)
MCHC: 34.1 g/dL (ref 30.0–36.0)
MCV: 94.8 fl (ref 78.0–100.0)
Monocytes Absolute: 0.5 10*3/uL (ref 0.1–1.0)
Neutro Abs: 3.5 10*3/uL (ref 1.4–7.7)
Neutrophils Relative %: 64.1 % (ref 43.0–77.0)
RBC: 4.58 Mil/uL (ref 4.22–5.81)
WBC: 5.5 10*3/uL (ref 4.5–10.5)

## 2013-04-02 LAB — BASIC METABOLIC PANEL
BUN: 14 mg/dL (ref 6–23)
Chloride: 104 mEq/L (ref 96–112)
Creatinine, Ser: 0.8 mg/dL (ref 0.4–1.5)
GFR: 96.13 mL/min (ref >60.00)
Glucose, Bld: 149 mg/dL — ABNORMAL HIGH (ref 70–99)
Potassium: 3.8 mEq/L (ref 3.5–5.1)

## 2013-04-02 LAB — POCT URINALYSIS DIPSTICK
Glucose, UA: NEGATIVE
Ketones, UA: NEGATIVE
Nitrite, UA: NEGATIVE
pH, UA: 6

## 2013-04-02 NOTE — Telephone Encounter (Signed)
Pt called, requesting call back, no details. Returned call, no answer.

## 2013-04-02 NOTE — Progress Notes (Signed)
Chief Complaint  Patient presents with  . Hematuria    HPI:  Gross hematuria: -noticed started 4 days ago -urine has been dark red on several occassions on and off for the last few days -somewhat lightheaded on and off today -denies: fevers, burning with urinary, urinary frequency or urgency -had stomach illness last week with nausea, vomiting, diarrhea and  - now resolved, rectal pain, SOB, CP -had blood in urine years ago like this when on coumadin and reports was due to supratherapeutic INR -switched from coumadin to xarelto in January after stroke from A. Fib(had been off coumadin for a colonocopy) -no GI bleeding, gum or nose bleeding, bruising -has seen cardiologist recently and his PCP, no labs done -las labs done in January with mild anemia, low calcium -reports followed by Dr. Harriet Pho at [facility] urology for kidney stones -has not taken xarelto yet today - takes in the afternoon  ROS: See pertinent positives and negatives per HPI.  Past Medical History  Diagnosis Date  . PE (pulmonary embolism)   . Colon polyps   . CHF (congestive heart failure)   . CAD (coronary artery disease)   . Diabetes mellitus   . Diverticulosis of colon   . Hyperlipidemia   . Hypertension   . MI (mitral incompetence)   . Nephrolithiasis   . V-tach     Family History  Problem Relation Age of Onset  . Colon cancer Father   . Diabetes Father   . Heart disease Father   . Bladder Cancer Mother   . Heart disease Mother     History   Social History  . Marital Status: Married    Spouse Name: N/A    Number of Children: 3  . Years of Education: N/A   Occupational History  . Retired    Social History Main Topics  . Smoking status: Current Every Day Smoker -- 0.50 packs/day    Types: Cigarettes  . Smokeless tobacco: Never Used  . Alcohol Use: No  . Drug Use: No  . Sexually Active: None   Other Topics Concern  . None   Social History Narrative  . None    Current outpatient  prescriptions:atorvastatin (LIPITOR) 80 MG tablet, Take 80 mg by mouth daily., Disp: , Rfl: ;  carvedilol (COREG) 6.25 MG tablet, Take 6.25 mg by mouth 2 (two) times daily., Disp: , Rfl: ;  enalapril (VASOTEC) 10 MG tablet, , Disp: , Rfl: ;  fish oil-omega-3 fatty acids 1000 MG capsule, Take 2 g by mouth daily. , Disp: , Rfl: ;  LANOXIN 0.25 MG tablet, Take 0.5 tablets (0.125 mg total) by mouth daily., Disp: 90 each, Rfl: 0 losartan (COZAAR) 50 MG tablet, Take 1 tablet (50 mg total) by mouth daily., Disp: 90 tablet, Rfl: 3;  mometasone (NASONEX) 50 MCG/ACT nasal spray, Place 1 spray into the nose daily., Disp: 17 g, Rfl: 12;  Rivaroxaban (XARELTO) 20 MG TABS, Take 1 tablet (20 mg total) by mouth daily., Disp: 30 tablet, Rfl: 5  EXAM:  Filed Vitals:   04/02/13 1057  BP: 100/60  Temp: 98 F (36.7 C)    Body mass index is 30.11 kg/(m^2).  GENERAL: vitals reviewed and listed above, alert, oriented, appears well hydrated and in no acute distress  HEENT: atraumatic, conjunttiva clear, no obvious abnormalities on inspection of external nose and ears  NECK: no obvious masses on inspection  LUNGS: clear to auscultation bilaterally, no wheezes, rales or rhonchi, good air movement  CV: HRRR, no  peripheral edema  MS: moves all extremities without noticeable abnormality  PSYCH: pleasant and cooperative, no obvious depression or anxiety  ASSESSMENT AND PLAN:  Discussed the following assessment and plan:  Hematuria - Plan: POCT urinalysis dipstick, POC Hemoglobin, CBC with Differential, Basic metabolic panel, Urine Microscopic Only, Culture, Urine  -72 yo with complicated PMH, permanent A. Fib and recent stroke when coumadin stopped for colonoscopy, now on xarelto for 3 months presenting with gross heamturia. Hx of gross hematuria in the past.  -urine with gross blood, 3+bld, 1+P stat hgb 14.2 - BMP and CBC pending and will fax to uro -our staff contacted the urologist office adn the patient  spoke with nurse from urology and will see urologist in next 24 hours - query urinary tract polyp, lesions - will fax studies from this visit per their request to their office -my staff contacted his cardiologist to make them aware and make recommendations regarding his xaralto, I also sent an epic urgent message to [name] as his office reported he was in a procedure. In the meantime advised wife to hold on his dose of xarelto today until further recs from cards/uro. Wife will also try to get a hold of cards. -Patient advised to return or notify a doctor immediately if symptoms worsen or persist or new concerns arise.  There are no Patient Instructions on file for this visit.   Kriste Basque R.

## 2013-04-02 NOTE — Progress Notes (Signed)
Quick Note:  Faxed to [facility] ______

## 2013-04-02 NOTE — Telephone Encounter (Signed)
Returned pt's call. Requesting earlier appt w/ Dr. Pearlean Brownie. Pt is currently on waiting list. Apologized for the inconvenience, advised patient to notify a doctor (PCP, ER, Urgent Care) immediately if symptoms worsen or persist or if new concerns arise.

## 2013-04-03 LAB — URINALYSIS, MICROSCOPIC ONLY: RBC / HPF: >50 RBC/hpf — AB (ref <3)

## 2013-04-04 LAB — URINE CULTURE
Colony Count: NO GROWTH
Organism ID, Bacteria: NO GROWTH

## 2013-04-10 ENCOUNTER — Other Ambulatory Visit: Payer: Self-pay | Admitting: Internal Medicine

## 2013-05-01 ENCOUNTER — Encounter: Payer: Self-pay | Admitting: Neurology

## 2013-05-01 ENCOUNTER — Ambulatory Visit (INDEPENDENT_AMBULATORY_CARE_PROVIDER_SITE_OTHER): Payer: Medicare Other | Admitting: Neurology

## 2013-05-01 ENCOUNTER — Other Ambulatory Visit: Payer: Self-pay | Admitting: *Deleted

## 2013-05-01 VITALS — BP 117/73 | HR 59 | Temp 98.0°F | Ht 72.0 in | Wt 200.0 lb

## 2013-05-01 DIAGNOSIS — I6350 Cerebral infarction due to unspecified occlusion or stenosis of unspecified cerebral artery: Secondary | ICD-10-CM

## 2013-05-01 DIAGNOSIS — I639 Cerebral infarction, unspecified: Secondary | ICD-10-CM

## 2013-05-01 NOTE — Progress Notes (Signed)
[facility]  PATIENT: Carl Wood DOB: January 31, 1941   HISTORY FROM: patient, chart REASON FOR VISIT: 3 month stroke follow up  HISTORY OF PRESENT ILLNESS:  Carl Wood is an 72 y.o. male history of pulmonary embolism, atrial fibrillation, on Coumadin, congestive heart failure, diabetes mellitus, hypertension and hyperlipidemia, presenting with onset of speech difficulty and right-sided weakness on 12/19/2012.   Has no previous history of stroke nor TIA. Patient has been on Coumadin long-term Coumadin suspended 2 weeks ago for colonoscopy. Coumadin was restarted on 12/11/2012. INR today was 1.49. Initial CT scan showed no acute intracranial abnormality. Initial NIH stroke score was 5. Patient improved in the emergency room to a stroke score of 1. Thrombolytic therapy with IV TPA was not elected because of the minimal residual neurologic deficits. Shortly before noon patient had a relapse of deficits with expressive aphasia and right hemiplegia. Repeat CT scan showed hypodensity involving the left MCA indicative of intravascular thrombus. Patient was taken to interventional radiology. Cerebral angiography demonstrated thrombosis involving the left internal carotid artery just above the common carotid artery bifurcation and extending into the middle cerebral and anterior cerebral artery branches. Patient subsequently underwent intra-arterial TPA infusion followed by thrombectomy. MCA territory circulation was restored and all branches. Patient had good collateral filling of the left ACA via the anterior communicating artery.  Update 05/01/13: Carl Wood comes in today for 3 months stroke follow up.  He is doing well and has no residual right-sided weakness or speech difficulty.  He states he has a feeling of something caught in the back of his throat often, which causes a nagging cough, but he swallows fine.  He states [name] stopped his Enalapril thinking it may be causing the  cough, but he still has it.  His risk factors of Diabetes are controlled with weight loss and low carb diet, HgbA1c is 6.5.  Hyperlipidemia is well controlled on Lipitor, LDL 61, total cholesterol 562. Patient was discharged on Xarelto for long-standing afib. Patient denies medication side effects, with no signs of bleeding.  NIHSS 0, MRS 1.  REVIEW OF SYSTEMS: Full 14 system review of systems performed and all negative.  ALLERGIES: Allergies  Allergen Reactions  . Penicillins     REACTION: unspecified  . Sulfamethoxazole     REACTION: unspecified    HOME MEDICATIONS: Outpatient Prescriptions Prior to Visit  Medication Sig Dispense Refill  . atorvastatin (LIPITOR) 80 MG tablet TAKE ONE TABLET BY MOUTH EVERY DAY AS DIRECTED  30 tablet  0  . carvedilol (COREG) 6.25 MG tablet Take 6.25 mg by mouth 2 (two) times daily.      . fish oil-omega-3 fatty acids 1000 MG capsule Take 2 g by mouth daily.       Marland Kitchen LANOXIN 0.25 MG tablet Take 0.5 tablets (0.125 mg total) by mouth daily.  90 each  0  . losartan (COZAAR) 50 MG tablet Take 1 tablet (50 mg total) by mouth daily.  90 tablet  3  . mometasone (NASONEX) 50 MCG/ACT nasal spray Place 1 spray into the nose daily.  17 g  12  . Rivaroxaban (XARELTO) 20 MG TABS Take 1 tablet (20 mg total) by mouth daily.  30 tablet  5  . enalapril (VASOTEC) 10 MG tablet        No facility-administered medications prior to visit.    PAST MEDICAL HISTORY: Past Medical History  Diagnosis Date  . PE (pulmonary embolism)   . Colon polyps   .  CHF (congestive heart failure)   . CAD (coronary artery disease)   . Diabetes mellitus   . Diverticulosis of colon   . Hyperlipidemia   . Hypertension   . MI (mitral incompetence)   . Nephrolithiasis   . V-tach     PAST SURGICAL HISTORY: Past Surgical History  Procedure Laterality Date  . Back surgery    . Cardiac defibrillator placement      medtronic virtuoso  . Basal cell carcinoma excision      nose  . Knee  arthroplasty    . Cholecystectomy    . Colonoscopy  12/11/2012    Procedure: COLONOSCOPY;  Surgeon: Louis Meckel, MD;  Location: [facility];  Service: Endoscopy;  Laterality: N/A;    FAMILY HISTORY: Family History  Problem Relation Age of Onset  . Colon cancer Father   . Diabetes Father   . Heart disease Father   . Bladder Cancer Mother   . Heart disease Mother     SOCIAL HISTORY: History   Social History  . Marital Status: Married    Spouse Name: N/A    Number of Children: 3  . Years of Education: N/A   Occupational History  . Retired    Social History Main Topics  . Smoking status: Never Smoker   . Smokeless tobacco: Never Used  . Alcohol Use: No  . Drug Use: No  . Sexually Active: Not on file   Other Topics Concern  . Not on file   Social History Narrative  . No narrative on file     PHYSICAL EXAM  Filed Vitals:   05/01/13 1415  BP: 117/73  Pulse: 59  Temp: 98 F (36.7 C)  TempSrc: Oral  Height: 6' (1.829 m)  Weight: 200 lb (90.719 kg)   Body mass index is 27.12 kg/(m^2).  GENERAL EXAM: Patient is in no distress, well developed and well groomed. HEAD: Symmetric facial features. EARS, NOSE, and THROAT: Normal.  NECK: Supple, no JVD RESPIRATORY: expiratory wheeze, LUL, otherwise clear CARDIOVASCULAR:  Irregular rate and rhythm, no murmurs, no carotid bruits SKIN: No rash, no bruising  NEUROLOGIC: MENTAL STATUS: awake, alert and oriented to person, place and time, language fluent, comprehension intact, naming intact CRANIAL NERVE: pupils equal and reactive to light, visual fields full to confrontation, extraocular muscles intact, no nystagmus, facial sensation and strength symmetric, uvula midline, shoulder shrug symmetric, tongue midline. MOTOR: normal bulk and tone, full strength in the BUE, BLE SENSORY: normal and symmetric to light touch, pinprick, temperature, vibration and proprioception COORDINATION: finger-nose-finger, fine finger  movements normal REFLEXES: deep tendon reflexes present and symmetric 2+, Babinski is mute, no clonus is noted. GAIT/STATION: narrow based gait; able to walk on toes, heels and tandem; romberg is negative. No assistive device. NIH SS 0  modified Rankin scale 1  DIAGNOSTIC DATA (LABS, IMAGING, TESTING) - I reviewed patient records, labs, notes, testing and imaging myself where available.  Lab Results  Component Value Date   WBC 5.5 04/02/2013   HGB 14.4 04/02/2013   HCT 43.4 04/02/2013   MCV 94.8 04/02/2013   PLT 144.0* 04/02/2013      Component Value Date/Time   NA 138 04/02/2013 1124   K 3.8 04/02/2013 1124   CL 104 04/02/2013 1124   CO2 30 04/02/2013 1124   GLUCOSE 149* 04/02/2013 1124   GLUCOSE 98 10/10/2006 1113   BUN 14 04/02/2013 1124   CREATININE 0.8 04/02/2013 1124   CALCIUM 8.8 04/02/2013 1124   PROT 7.1  12/19/2012 0940   ALBUMIN 3.7 12/19/2012 0940   AST 21 12/19/2012 0940   ALT 19 12/19/2012 0940   ALKPHOS 80 12/19/2012 0940   BILITOT 0.6 12/19/2012 0940   GFRNONAA >90 12/21/2012 0500   GFRAA >90 12/21/2012 0500   Lab Results  Component Value Date   CHOL 113 12/20/2012   HDL 28* 12/20/2012   LDLCALC 61 12/20/2012   TRIG 119 12/20/2012   CHOLHDL 4.0 12/20/2012   Lab Results  Component Value Date   HGBA1C 6.5* 12/20/2012   No results found for this basename: VITAMINB12   No results found for this basename: TSH   SIGNIFICANT DIAGNOSTIC STUDIES  CT of the brain  12/21/2012 Slight progression of the extent of cytotoxic dema around the previously identified left basal ganglia infarct. No interval hemorrhage  12/20/2012 Low attenuation change in the left internal capsule and basal ganglia consistent with evolving acute infarct. No acute intracranial hemorrhage.  12/19/2012 Stable CT appearance of the brain. Continued increased conspicuity of the left MCA M1 segment suggesting thrombus in the setting. No intracranial hemorrhage or mass effect.  12/19/2012 Question increased density of the  left middle cerebral artery suggesting thrombosis.  12/19/2012 No evidence of acute intracranial abnormality  Cerebral Angiogram 12/19/2012 S/P bilateral common carotid arteriogram,followed by complete revascularization of T occlusion of the LT ICA terminus.,using 7.4 mg of IA TPA and the trevoprovue retrieval device.  MRI of the brain defibrillator  2D Echocardiogram EF 30-35% with no source of embolus.  LE Arterial Doppler  12/25/2012  12/23/2012 There is a large pseudoaneurysm measuring 1.74cm X 2.0cm with a neck measuring 1.04cm at the confluence tapering to 0.60cm, noted in the right groin. Large hematoma noted proximal thigh. EKG atrial fibrillation, HR 60   ASSESSMENT AND PLAN  Carl Wood is a pleasant 72 year old gentleman who suffered left hemispheric infarct on 12/19/12. From  occlusion of the terminal left internal carotid artery ,followed by complete revascularization of T occlusion of the LT ICA terminus.,using 7.4 mg of IA TPA and the trevoprovue retrieval device. Post procedure Imaging confirmed a relatively small left basal ganglia infarct considering he had a ICA occlusion. Infarct felt to be embolic secondary to left ICA occlusion associated with atrial fibrillation. On warfarin prior to admission for atrial fibrillation, INR subtherapeutic at 1.49. After discussion with [name], changed to xarelto 20 mg daily for secondary stroke prevention. Right-sided weakness and speech difficulties have resolved.    Xarelto 20 mg daily for secondary stroke prevention, chronic afib  CAD-MI-CHF with pacer/defibrillator, [name]  Diabetes well controlled, HgbA1c 6.5 with diet and exercise.  Blood Pressure goal is less than 130/80, controlled, continue current treatment.  Cholesterol goals are less than 200 for total cholesterol and less than 100 for LDL, controlled, continue current treatment.  Patient encouraged to continue exercise for 30 minutes 4-5 days per week.  Return for follow  up appt in 3 months.   Anida Deol NP-C 05/01/2013, 2:40 PM  [facility] [address] [place], [place] [address] [phone]  I have personally examined this patient, reviewed pertinent data, developed plan of care and discussed with patient and agree with above.  Delia Heady, MD

## 2013-05-01 NOTE — Patient Instructions (Addendum)
Continue current treatment, return for follow up in 3 months.   STROKE/TIA INSTRUCTIONS SMOKING Cigarette smoking nearly doubles your risk of having a stroke & is the single most alterable risk factor  If you smoke or have smoked in the last 12 months, you are advised to quit smoking for your health.  Most of the excess cardiovascular risk related to smoking disappears within a year of stopping.  Ask you doctor about anti-smoking medications  [place] Quit Line: 1-800-QUIT NOW  Free Smoking Cessation Classes [phone]  CHOLESTEROL Know your levels; limit fat & cholesterol in your diet  Lab Results  Component Value Date   CHOL 113 12/20/2012   HDL 28* 12/20/2012   LDLCALC 61 12/20/2012   TRIG 119 12/20/2012   CHOLHDL 4.0 12/20/2012      Many patients benefit from treatment even if their cholesterol is at goal.  Goal: Total Cholesterol less than 160  Goal:  LDL less than 100  Goal:  HDL greater than 40  Goal:  Triglycerides less than 150  BLOOD PRESSURE American Stroke Association blood pressure target is less that 120/80 mm/Hg  Your discharge blood pressure is:  BP: 117/73 mmHg  Monitor your blood pressure  Limit your salt and alcohol intake  Many individuals will require more than one medication for high blood pressure  DIABETES (A1c is a blood sugar average for last 3 months) Goal A1c is under 7% (A1c is blood sugar average for last 3 months)  Diabetes: Diagnosis of diabetes:  Your A1c:6.5 %    Lab Results  Component Value Date   HGBA1C 6.5* 12/20/2012    Your A1c can be lowered with medications, healthy diet, and exercise.  Check your blood sugar as directed by your physician  Call your physician if you experience unexplained or low blood sugars.  PHYSICAL ACTIVITY/REHABILITATION Goal is 30 minutes at least 4 days per week    Activity decreases your risk of heart attack and stroke and makes your heart stronger.  It helps control your weight and blood pressure; helps  you relax and can improve your mood.  Participate in a regular exercise program.  Talk with your doctor about the best form of exercise for you (dancing, walking, swimming, cycling).  DIET/WEIGHT Goal is to maintain a healthy weight  Your height is:  Height: 6' (182.9 cm) Your current weight is: Weight: 200 lb (90.719 kg) Your body Mass Index (BMI) is:  BMI (Calculated): 27.2  Following the type of diet specifically designed for you will help prevent another stroke.  Your goal Body Mass Index (BMI) is 19-24.  Healthy food habits can help reduce 3 risk factors for stroke:  High cholesterol, hypertension, and excess weight.

## 2013-05-05 ENCOUNTER — Other Ambulatory Visit: Payer: Self-pay | Admitting: Internal Medicine

## 2013-05-08 ENCOUNTER — Ambulatory Visit: Payer: Medicare Other | Admitting: Internal Medicine

## 2013-05-23 ENCOUNTER — Ambulatory Visit: Payer: Medicare Other | Admitting: Internal Medicine

## 2013-05-28 ENCOUNTER — Ambulatory Visit (INDEPENDENT_AMBULATORY_CARE_PROVIDER_SITE_OTHER)
Admission: RE | Admit: 2013-05-28 | Discharge: 2013-05-28 | Disposition: A | Discharge status: Home / Self Care | Payer: Medicare Other | Source: Ambulatory Visit | Attending: Internal Medicine | Admitting: Internal Medicine

## 2013-05-28 ENCOUNTER — Ambulatory Visit (INDEPENDENT_AMBULATORY_CARE_PROVIDER_SITE_OTHER): Payer: Medicare Other | Admitting: Internal Medicine

## 2013-05-28 ENCOUNTER — Encounter: Payer: Self-pay | Admitting: Internal Medicine

## 2013-05-28 VITALS — BP 112/72 | HR 68 | Temp 98.0°F | Wt 204.0 lb

## 2013-05-28 DIAGNOSIS — M25551 Pain in right hip: Secondary | ICD-10-CM

## 2013-05-28 DIAGNOSIS — M25559 Pain in unspecified hip: Secondary | ICD-10-CM

## 2013-05-28 DIAGNOSIS — I2510 Atherosclerotic heart disease of native coronary artery without angina pectoris: Secondary | ICD-10-CM

## 2013-05-28 DIAGNOSIS — E119 Type 2 diabetes mellitus without complications: Secondary | ICD-10-CM

## 2013-05-28 LAB — BASIC METABOLIC PANEL
BUN: 14 mg/dL (ref 6–23)
Calcium: 9.3 mg/dL (ref 8.4–10.5)
Creatinine, Ser: 0.8 mg/dL (ref 0.4–1.5)
GFR: 103.14 mL/min (ref >60.00)

## 2013-05-28 LAB — HEPATIC FUNCTION PANEL
ALT: 19 U/L (ref 0–53)
AST: 22 U/L (ref 0–37)
Alkaline Phosphatase: 71 U/L (ref 39–117)
Bilirubin, Direct: 0.2 mg/dL (ref 0.0–0.3)
Total Protein: 7.6 g/dL (ref 6.0–8.3)

## 2013-05-28 LAB — MICROALBUMIN / CREATININE URINE RATIO: Microalb Creat Ratio: 0.8 mg/g (ref 0.0–30.0)

## 2013-05-28 LAB — LIPID PANEL
Cholesterol: 132 mg/dL (ref 0–200)
LDL Cholesterol: 79 mg/dL (ref 0–99)
Triglycerides: 61 mg/dL (ref 0.0–149.0)

## 2013-05-28 LAB — HEMOGLOBIN A1C: Hgb A1c MFr Bld: 7.1 % — ABNORMAL HIGH (ref 4.6–6.5)

## 2013-05-28 NOTE — Assessment & Plan Note (Signed)
Needs lab work today

## 2013-05-28 NOTE — Progress Notes (Signed)
Patient ID: Carl Wood, male   DOB: [birth year], 72 y.o.   MRN: [id number]  Cad with chf-- no sxs  Stroke- no recurrence and has completley recovered  Has a "catch" in the right hip. Only happens when he first stands.   Lipids- toelrating meds  afib- opn xarelto- no bleeding complications.   Reviewed pmh, psh, sochx Reviewed meds   patient denies chest pain, shortness of breath, orthopnea. Denies lower extremity edema, abdominal pain, change in appetite, change in bowel movements. Patient denies rashes, musculoskeletal complaints. No other specific complaints in a complete review of systems.    well-developed well-nourished male in no acute distress. HEENT exam atraumatic, normocephalic, neck supple without jugular venous distention. Chest clear to auscultation cardiac exam S1-S2 are irregular. Abdominal exam overweight with bowel sounds, soft and nontender. Extremities no edema. Neurologic exam is alert with a normal gait.  A/p--in additon to the below- he needs excuse not to wear seat belt.

## 2013-05-30 NOTE — Assessment & Plan Note (Signed)
No recurrent sxs 

## 2013-05-30 NOTE — Assessment & Plan Note (Signed)
No sxs of CAD Risk factor modification

## 2013-06-06 ENCOUNTER — Encounter: Payer: Self-pay | Admitting: Internal Medicine

## 2013-06-06 ENCOUNTER — Ambulatory Visit (INDEPENDENT_AMBULATORY_CARE_PROVIDER_SITE_OTHER): Payer: Medicare Other | Admitting: *Deleted

## 2013-06-06 DIAGNOSIS — I472 Ventricular tachycardia, unspecified: Secondary | ICD-10-CM

## 2013-06-06 DIAGNOSIS — I4891 Unspecified atrial fibrillation: Secondary | ICD-10-CM

## 2013-06-06 DIAGNOSIS — Z95810 Presence of automatic (implantable) cardiac defibrillator: Secondary | ICD-10-CM

## 2013-06-06 DIAGNOSIS — I4729 Other ventricular tachycardia: Secondary | ICD-10-CM

## 2013-06-06 DIAGNOSIS — I509 Heart failure, unspecified: Secondary | ICD-10-CM

## 2013-06-06 LAB — ICD DEVICE OBSERVATION
FVT: 0
HV IMPEDENCE: 74 Ohm
RV LEAD IMPEDENCE ICD: 472 Ohm
RV LEAD THRESHOLD: 0.5 V
TZAT-0001FASTVT: 1
TZAT-0001SLOWVT: 1
TZAT-0001SLOWVT: 2
TZAT-0002FASTVT: NEGATIVE
TZAT-0004SLOWVT: 8
TZAT-0004SLOWVT: 8
TZAT-0012FASTVT: 200 ms
TZAT-0013SLOWVT: 1
TZAT-0013SLOWVT: 1
TZAT-0018FASTVT: NEGATIVE
TZAT-0018SLOWVT: NEGATIVE
TZAT-0020SLOWVT: 1.5 ms
TZAT-0020SLOWVT: 1.5 ms
TZON-0003VSLOWVT: 430 ms
TZON-0004SLOWVT: 28
TZON-0004VSLOWVT: 32
TZST-0001FASTVT: 4
TZST-0001FASTVT: 5
TZST-0001FASTVT: 6
TZST-0001SLOWVT: 3
TZST-0001SLOWVT: 5
TZST-0002FASTVT: NEGATIVE
TZST-0002FASTVT: NEGATIVE
TZST-0003SLOWVT: 35 J
VF: 0

## 2013-06-06 NOTE — Progress Notes (Signed)
Defib check in clinic, all functions normal.  ROV w/ device clinic in 3 mo. CZ

## 2013-06-07 ENCOUNTER — Encounter: Payer: Self-pay | Admitting: Internal Medicine

## 2013-06-07 ENCOUNTER — Ambulatory Visit (INDEPENDENT_AMBULATORY_CARE_PROVIDER_SITE_OTHER): Payer: Medicare Other | Admitting: Internal Medicine

## 2013-06-07 VITALS — BP 104/70

## 2013-06-07 DIAGNOSIS — M25551 Pain in right hip: Secondary | ICD-10-CM

## 2013-06-07 DIAGNOSIS — Z95810 Presence of automatic (implantable) cardiac defibrillator: Secondary | ICD-10-CM

## 2013-06-07 DIAGNOSIS — I509 Heart failure, unspecified: Secondary | ICD-10-CM

## 2013-06-07 DIAGNOSIS — I2589 Other forms of chronic ischemic heart disease: Secondary | ICD-10-CM

## 2013-06-07 DIAGNOSIS — M25559 Pain in unspecified hip: Secondary | ICD-10-CM

## 2013-06-07 DIAGNOSIS — I4891 Unspecified atrial fibrillation: Secondary | ICD-10-CM

## 2013-06-07 DIAGNOSIS — I4729 Other ventricular tachycardia: Secondary | ICD-10-CM

## 2013-06-07 DIAGNOSIS — I472 Ventricular tachycardia: Secondary | ICD-10-CM

## 2013-06-07 MED ORDER — LOSARTAN POTASSIUM 50 MG PO TABS: 25.0000 mg | ORAL_TABLET | Freq: Every day | ORAL | Status: DC

## 2013-06-07 MED ORDER — MOMETASONE FUROATE 50 MCG/ACT NA SUSP: 1.0000 | Freq: Every day | NASAL | Status: DC | PRN

## 2013-06-09 NOTE — Progress Notes (Signed)
Patient ID: Carl Wood, male   DOB: 1941/04/11, 72 y.o.   MRN: [id number]  Still with hip pain  15 minute discussion all counselling with patient and wife  Etiology of hip pain is unclear-- mild degenerative changes of hip Refer PT for eval and treat

## 2013-06-12 ENCOUNTER — Ambulatory Visit: Payer: Medicare Other | Attending: Internal Medicine | Admitting: Physical Therapy

## 2013-06-12 DIAGNOSIS — M25559 Pain in unspecified hip: Secondary | ICD-10-CM | POA: Insufficient documentation

## 2013-06-12 DIAGNOSIS — IMO0001 Reserved for inherently not codable concepts without codable children: Secondary | ICD-10-CM | POA: Insufficient documentation

## 2013-06-12 DIAGNOSIS — M25669 Stiffness of unspecified knee, not elsewhere classified: Secondary | ICD-10-CM | POA: Insufficient documentation

## 2013-06-13 ENCOUNTER — Other Ambulatory Visit: Payer: Self-pay | Admitting: *Deleted

## 2013-06-13 MED ORDER — LANOXIN 250 MCG PO TABS: ORAL_TABLET | ORAL | Status: DC

## 2013-06-14 ENCOUNTER — Other Ambulatory Visit: Payer: Self-pay

## 2013-07-23 ENCOUNTER — Ambulatory Visit: Payer: Self-pay | Admitting: Neurology

## 2013-08-01 ENCOUNTER — Encounter: Payer: Self-pay | Admitting: Nurse Practitioner

## 2013-08-01 ENCOUNTER — Ambulatory Visit (INDEPENDENT_AMBULATORY_CARE_PROVIDER_SITE_OTHER): Payer: Medicare Other | Admitting: Nurse Practitioner

## 2013-08-01 VITALS — BP 112/68 | HR 57 | Temp 98.1°F | Ht 71.25 in | Wt 203.0 lb

## 2013-08-01 DIAGNOSIS — I639 Cerebral infarction, unspecified: Secondary | ICD-10-CM

## 2013-08-01 DIAGNOSIS — I4891 Unspecified atrial fibrillation: Secondary | ICD-10-CM

## 2013-08-01 DIAGNOSIS — E119 Type 2 diabetes mellitus without complications: Secondary | ICD-10-CM

## 2013-08-01 DIAGNOSIS — I10 Essential (primary) hypertension: Secondary | ICD-10-CM

## 2013-08-01 DIAGNOSIS — I6350 Cerebral infarction due to unspecified occlusion or stenosis of unspecified cerebral artery: Secondary | ICD-10-CM

## 2013-08-01 NOTE — Patient Instructions (Signed)
Xarelto 20 mg daily for secondary stroke prevention, chronic afib  CAD-MI-CHF with pacer/defibrillator, [name]  Diabetes well controlled, HgbA1c 6.5 with diet and exercise. Blood Pressure goal is less than 130/90, controlled, continue current treatment.  Cholesterol goals are less than 200 for total cholesterol and less than 100 for LDL, controlled, continue current treatment.  Patient encouraged to continue exercise for 30 minutes 4-5 days per week. Followup in 6 months.

## 2013-08-01 NOTE — Progress Notes (Signed)
[facility]  PATIENT: Carl Wood DOB: 10/09/1941   HISTORY FROM: patient, chart REASON FOR VISIT: routine follow up  HISTORY OF PRESENT ILLNESS:  Carl Wood is an 72 y.o. male history of pulmonary embolism, atrial fibrillation, on Coumadin, congestive heart failure, diabetes mellitus, hypertension and hyperlipidemia, presenting with onset of speech difficulty and right-sided weakness on 12/19/2012. Has no previous history of stroke nor TIA. Patient has been on Coumadin long-term Coumadin suspended 2 weeks ago for colonoscopy. Coumadin was restarted on 12/11/2012. INR today was 1.49. Initial CT scan showed no acute intracranial abnormality. Initial NIH stroke score was 5. Patient improved in the emergency room to a stroke score of 1. Thrombolytic therapy with IV TPA was not elected because of the minimal residual neurologic deficits. Shortly before noon patient had a relapse of deficits with expressive aphasia and right hemiplegia. Repeat CT scan showed hypodensity involving the left MCA indicative of intravascular thrombus. Patient was taken to interventional radiology. Cerebral angiography demonstrated thrombosis involving the left internal carotid artery just above the common carotid artery bifurcation and extending into the middle cerebral and anterior cerebral artery branches. Patient subsequently underwent intra-arterial TPA infusion followed by thrombectomy. MCA territory circulation was restored and all branches. Patient had good collateral filling of the left ACA via the anterior communicating artery.  Update 05/01/13: Carl Wood comes in today for 3 months stroke follow up. He is doing well and has no residual right-sided weakness or speech difficulty. He states he has a feeling of something caught in the back of his throat often, which causes a nagging cough, but he swallows fine. He states [name] stopped his Enalapril thinking it may be causing the cough,  but he still has it. His risk factors of Diabetes are controlled with weight loss and low carb diet, HgbA1c is 6.5. Hyperlipidemia is well controlled on Lipitor, LDL 61, total cholesterol 130. Patient was discharged on Xarelto for long-standing afib. Patient denies medication side effects, with no signs of bleeding. NIHSS 0, MRS 1.  UPDATE 08/01/13 (LL):  Patient returns for follow up, last visit on 05/01/13.  Reports he is doing fine, no new neurovascular symptoms.  BP well controlled, actually had dose reduced due to hypotension.  Blood sugars are well controlled. Patient on Xarelto for long-standing afib.  Patient denies medication side effects, with no signs of bleeding or excessive bruising.  Still has nagging cough, feeling of phlegm in throat.  Was prescribed Nasonex but too expensive.   Recommended trying generic Flonase.  REVIEW OF SYSTEMS: Full 14 system review of systems performed and notable only for: constitutional: N/A  cardiovascular: N/A respiratory: cough endocrine: N/A  ear/nose/throat: N/A  Hematology/Lymph: N/A musculoskeletal: N/A skin: N/A genitourinary: N/A Gastrointestinal: N/A allergy/immunology: N/A neurological: N/A sleep: N/A psychiatric: N/A   ALLERGIES: Allergies  Allergen Reactions  . Penicillins     REACTION: unspecified  . Sulfamethoxazole     REACTION: unspecified    HOME MEDICATIONS: Outpatient Prescriptions Prior to Visit  Medication Sig Dispense Refill  . atorvastatin (LIPITOR) 80 MG tablet TAKE ONE TABLET BY MOUTH EVERY DAY AS DIRECTED  30 tablet  0  . carvedilol (COREG) 6.25 MG tablet TAKE ONE TABLET BY MOUTH TWICE DAILY  180 tablet  0  . fish oil-omega-3 fatty acids 1000 MG capsule Take 2 g by mouth daily.       Marland Kitchen LANOXIN 250 MCG tablet TAKE ONE-HALF TABLET BY MOUTH EVERY DAY  15 tablet  8  .  losartan (COZAAR) 50 MG tablet Take 0.5 tablets (25 mg total) by mouth daily.  90 tablet  3  . mometasone (NASONEX) 50 MCG/ACT nasal spray Place 1  spray into the nose daily as needed.  17 g  3  . Rivaroxaban (XARELTO) 20 MG TABS Take 1 tablet (20 mg total) by mouth daily.  30 tablet  5  . alfuzosin (UROXATRAL) 10 MG 24 hr tablet Take 10 mg by mouth daily.        No facility-administered medications prior to visit.    PAST MEDICAL HISTORY: Past Medical History  Diagnosis Date  . PE (pulmonary embolism)   . Colon polyps   . CHF (congestive heart failure)   . CAD (coronary artery disease)   . Diabetes mellitus   . Diverticulosis of colon   . Hyperlipidemia   . Hypertension   . MI (mitral incompetence)   . Nephrolithiasis   . V-tach     PAST SURGICAL HISTORY: Past Surgical History  Procedure Laterality Date  . Back surgery    . Cardiac defibrillator placement      medtronic virtuoso  . Basal cell carcinoma excision      nose  . Knee arthroplasty    . Cholecystectomy    . Colonoscopy  12/11/2012    Procedure: COLONOSCOPY;  Surgeon: Louis Meckel, MD;  Location: [facility];  Service: Endoscopy;  Laterality: N/A;    FAMILY HISTORY: Family History  Problem Relation Age of Onset  . Colon cancer Father   . Diabetes Father   . Heart disease Father   . Bladder Cancer Mother   . Heart disease Mother     SOCIAL HISTORY: History   Social History  . Marital Status: Married    Spouse Name: N/A    Number of Children: 3  . Years of Education: N/A   Occupational History  . Retired    Social History Main Topics  . Smoking status: Never Smoker   . Smokeless tobacco: Never Used  . Alcohol Use: No  . Drug Use: No  . Sexual Activity: Not on file   Other Topics Concern  . Not on file   Social History Narrative  . No narrative on file     PHYSICAL EXAM  Filed Vitals:   08/01/13 1358  BP: 112/68  Pulse: 57  Temp: 98.1 F (36.7 C)  Height: 5' 11.25" (1.81 m)  Weight: 203 lb (92.08 kg)   Body mass index is 28.11 kg/(m^2).  GENERAL EXAM: Patient is in no distress, well developed and well groomed.    HEAD: Symmetric facial features.  EARS, NOSE, and THROAT: Normal.  NECK: Supple, no JVD  RESPIRATORY: expiratory wheeze, LUL, otherwise clear  CARDIOVASCULAR: Irregular rate and rhythm, no murmurs, no carotid bruits  SKIN: No rash, no bruising  NEUROLOGIC:  MENTAL STATUS: awake, alert and oriented to person, place and time, language fluent, comprehension intact, naming intact  CRANIAL NERVE: pupils equal and reactive to light, visual fields full to confrontation, extraocular muscles intact, no nystagmus, facial sensation and strength symmetric, uvula midline, shoulder shrug symmetric, tongue midline.  MOTOR: normal bulk and tone, full strength in the BUE, BLE  SENSORY: normal and symmetric to light touch, pinprick, temperature, vibration and proprioception  COORDINATION: finger-nose-finger, fine finger movements normal  REFLEXES: deep tendon reflexes present and symmetric 2+, Babinski is mute, no clonus is noted.  GAIT/STATION: narrow based gait; able to walk on toes, heels and tandem; romberg is negative. No assistive device.  DIAGNOSTIC DATA (LABS, IMAGING, TESTING) - I reviewed patient records, labs, notes, testing and imaging myself where available.  Lab Results  Component Value Date   WBC 5.5 04/02/2013   HGB 14.4 04/02/2013   HCT 43.4 04/02/2013   MCV 94.8 04/02/2013   PLT 144.0* 04/02/2013      Component Value Date/Time   NA 139 05/28/2013 1010   K 4.1 05/28/2013 1010   CL 104 05/28/2013 1010   CO2 31 05/28/2013 1010   GLUCOSE 174* 05/28/2013 1010   GLUCOSE 98 10/10/2006 1113   BUN 14 05/28/2013 1010   CREATININE 0.8 05/28/2013 1010   CALCIUM 9.3 05/28/2013 1010   PROT 7.6 05/28/2013 1010   ALBUMIN 4.0 05/28/2013 1010   AST 22 05/28/2013 1010   ALT 19 05/28/2013 1010   ALKPHOS 71 05/28/2013 1010   BILITOT 1.0 05/28/2013 1010   GFRNONAA >90 12/21/2012 0500   GFRAA >90 12/21/2012 0500   Lab Results  Component Value Date   CHOL 132 05/28/2013   HDL 40.50 05/28/2013   LDLCALC 79  05/28/2013   TRIG 61.0 05/28/2013   CHOLHDL 3 05/28/2013   Lab Results  Component Value Date   HGBA1C 7.1* 05/28/2013   No results found for this basename: VITAMINB12   No results found for this basename: TSH   SIGNIFICANT DIAGNOSTIC STUDIES  CT of the brain  12/21/2012 Slight progression of the extent of cytotoxic dema around the previously identified left basal ganglia infarct. No interval hemorrhage  12/20/2012 Low attenuation change in the left internal capsule and basal ganglia consistent with evolving acute infarct. No acute intracranial hemorrhage.  12/19/2012 Stable CT appearance of the brain. Continued increased conspicuity of the left MCA M1 segment suggesting thrombus in the setting. No intracranial hemorrhage or mass effect.  12/19/2012 Question increased density of the left middle cerebral artery suggesting thrombosis.  12/19/2012 No evidence of acute intracranial abnormality  Cerebral Angiogram 12/19/2012 S/P bilateral common carotid arteriogram,followed by complete revascularization of T occlusion of the LT ICA terminus.,using 7.4 mg of IA TPA and the trevoprovue retrieval device.  MRI of the brain defibrillator  2D Echocardiogram EF 30-35% with no source of embolus.  LE Arterial Doppler  12/25/2012  12/23/2012 There is a large pseudoaneurysm measuring 1.74cm X 2.0cm with a neck measuring 1.04cm at the confluence tapering to 0.60cm, noted in the right groin. Large hematoma noted proximal thigh.   ASSESSMENT AND PLAN Carl Wood is a pleasant 72 year old gentleman who suffered left hemispheric infarct on 12/19/12. From occlusion of the terminal left internal carotid artery ,followed by complete revascularization of T occlusion of the LT ICA terminus.,using 7.4 mg of IA TPA and the trevoprovue retrieval device. Post procedure Imaging confirmed a relatively small left basal ganglia infarct considering he had a ICA occlusion. Infarct felt to be embolic secondary to left ICA occlusion  associated with atrial fibrillation. On warfarin prior to admission for atrial fibrillation, INR subtherapeutic at 1.49. After discussion with [name], changed to xarelto 20 mg daily for secondary stroke prevention. Right-sided weakness and speech difficulties have resolved.   PLAN: Continue Xarelto 20 mg daily for secondary stroke prevention, chronic afib  CAD-MI-CHF with pacer/defibrillator, [name]  Diabetes well controlled, HgbA1c 6.5 with diet and exercise. Blood Pressure goal is less than 130/90, controlled, continue current treatment.  Cholesterol goals are less than 200 for total cholesterol and less than 100 for LDL, controlled, continue current treatment.  Patient encouraged to continue exercise for 30 minutes 4-5  days per week. Followup in 6 months.   Yunus Stoklosa NP-C 08/01/2013, 2:31 PM  [facility] [address] [place], [place] [address] [phone]

## 2013-08-03 ENCOUNTER — Other Ambulatory Visit: Payer: Self-pay | Admitting: *Deleted

## 2013-08-03 MED ORDER — ATORVASTATIN CALCIUM 80 MG PO TABS: ORAL_TABLET | ORAL | Status: DC

## 2013-08-07 ENCOUNTER — Other Ambulatory Visit: Payer: Self-pay

## 2013-08-07 MED ORDER — CARVEDILOL 6.25 MG PO TABS: ORAL_TABLET | ORAL | Status: DC

## 2013-09-07 ENCOUNTER — Ambulatory Visit (INDEPENDENT_AMBULATORY_CARE_PROVIDER_SITE_OTHER): Payer: Medicare Other

## 2013-09-07 DIAGNOSIS — Z23 Encounter for immunization: Secondary | ICD-10-CM

## 2013-09-10 ENCOUNTER — Ambulatory Visit (INDEPENDENT_AMBULATORY_CARE_PROVIDER_SITE_OTHER): Payer: Medicare Other | Admitting: *Deleted

## 2013-09-10 DIAGNOSIS — I472 Ventricular tachycardia, unspecified: Secondary | ICD-10-CM

## 2013-09-10 DIAGNOSIS — I4729 Other ventricular tachycardia: Secondary | ICD-10-CM

## 2013-09-10 DIAGNOSIS — Z95810 Presence of automatic (implantable) cardiac defibrillator: Secondary | ICD-10-CM

## 2013-09-10 LAB — ICD DEVICE OBSERVATION
BATTERY VOLTAGE: 3.06 V
BRDY-0002RV: 40 {beats}/min
CHARGE TIME: 10.059 s
DEV-0020ICD: NEGATIVE
HV IMPEDENCE: 78 Ohm
PACEART VT: 0
RV LEAD AMPLITUDE: 9.1 mv
RV LEAD IMPEDENCE ICD: 488 Ohm
RV LEAD THRESHOLD: 1 V
TOT-0001: 5
TOT-0002: 1
TOT-0006: 20090817000000
TZAT-0004SLOWVT: 8
TZAT-0004SLOWVT: 8
TZAT-0005SLOWVT: 88 pct
TZAT-0005SLOWVT: 91 pct
TZAT-0011SLOWVT: 10 ms
TZAT-0011SLOWVT: 10 ms
TZAT-0012FASTVT: 200 ms
TZAT-0012SLOWVT: 200 ms
TZAT-0012SLOWVT: 200 ms
TZAT-0018SLOWVT: NEGATIVE
TZAT-0019FASTVT: 8 V
TZAT-0020FASTVT: 1.5 ms
TZON-0003SLOWVT: 330 ms
TZON-0003VSLOWVT: 430 ms
TZON-0004VSLOWVT: 32
TZST-0001FASTVT: 2
TZST-0001FASTVT: 3
TZST-0001FASTVT: 5
TZST-0001FASTVT: 6
TZST-0001SLOWVT: 4
TZST-0001SLOWVT: 5
TZST-0002FASTVT: NEGATIVE
TZST-0002FASTVT: NEGATIVE
TZST-0002FASTVT: NEGATIVE
TZST-0003SLOWVT: 25 J
TZST-0003SLOWVT: 35 J
TZST-0003SLOWVT: 35 J
VENTRICULAR PACING ICD: 3.56 pct
VF: 0

## 2013-09-10 NOTE — Progress Notes (Signed)
ICD check in clinic. 12 nst episodes. Normal device function. Battery voltage 3.06 V. Pt enrolled in [employer]. carelink 12-12-13 and ROV in April with SK.

## 2013-09-27 ENCOUNTER — Telehealth: Payer: Self-pay | Admitting: Internal Medicine

## 2013-09-27 NOTE — Telephone Encounter (Signed)
Spoke with patient and transmitter is hooked up correctly and will download in January.

## 2013-09-27 NOTE — Telephone Encounter (Signed)
New message    Got home device monitor but do not know how to set it up.  He is home now.

## 2013-10-11 ENCOUNTER — Encounter: Payer: Self-pay | Admitting: Internal Medicine

## 2013-10-12 ENCOUNTER — Encounter: Payer: Self-pay | Admitting: Internal Medicine

## 2013-10-12 ENCOUNTER — Ambulatory Visit (INDEPENDENT_AMBULATORY_CARE_PROVIDER_SITE_OTHER): Payer: Medicare Other | Admitting: Internal Medicine

## 2013-10-12 VITALS — BP 114/70 | HR 64 | Temp 98.2°F | Ht 71.25 in | Wt 204.0 lb

## 2013-10-12 DIAGNOSIS — I2589 Other forms of chronic ischemic heart disease: Secondary | ICD-10-CM

## 2013-10-12 DIAGNOSIS — I10 Essential (primary) hypertension: Secondary | ICD-10-CM

## 2013-10-12 DIAGNOSIS — I2510 Atherosclerotic heart disease of native coronary artery without angina pectoris: Secondary | ICD-10-CM

## 2013-10-12 DIAGNOSIS — E119 Type 2 diabetes mellitus without complications: Secondary | ICD-10-CM

## 2013-10-12 LAB — BASIC METABOLIC PANEL
BUN: 14 mg/dL (ref 6–23)
CO2: 31 mEq/L (ref 19–32)
Calcium: 9.6 mg/dL (ref 8.4–10.5)
GFR: 98.69 mL/min (ref >60.00)
Glucose, Bld: 126 mg/dL — ABNORMAL HIGH (ref 70–99)
Potassium: 4.4 mEq/L (ref 3.5–5.1)
Sodium: 139 mEq/L (ref 135–145)

## 2013-10-12 NOTE — Progress Notes (Signed)
Pre-visit discussion using our clinic review tool. No additional management support is needed unless otherwise documented below in the visit note.  

## 2013-10-12 NOTE — Progress Notes (Signed)
CAD- no sxs of CP, DOE CHF- no PND, orthopnea Stroke- he states he has fully revocered and is quite active DM2- no polyuria, polydipsia AFIB- no bleeding complications on rivaroxaban  Past Medical History  Diagnosis Date  . PE (pulmonary embolism)   . Colon polyps   . CHF (congestive heart failure)   . CAD (coronary artery disease)   . Diabetes mellitus   . Diverticulosis of colon   . Hyperlipidemia   . Hypertension   . MI (mitral incompetence)   . Nephrolithiasis   . V-tach     History   Social History  . Marital Status: Married    Spouse Name: N/A    Number of Children: 3  . Years of Education: N/A   Occupational History  . Retired    Social History Main Topics  . Smoking status: Never Smoker   . Smokeless tobacco: Never Used  . Alcohol Use: No  . Drug Use: No  . Sexual Activity: Not on file   Other Topics Concern  . Not on file   Social History Narrative  . No narrative on file    Past Surgical History  Procedure Laterality Date  . Back surgery    . Cardiac defibrillator placement      medtronic virtuoso  . Basal cell carcinoma excision      nose  . Knee arthroplasty    . Cholecystectomy    . Colonoscopy  12/11/2012    Procedure: COLONOSCOPY;  Surgeon: Louis Meckel, MD;  Location: [facility];  Service: Endoscopy;  Laterality: N/A;    Family History  Problem Relation Age of Onset  . Colon cancer Father   . Diabetes Father   . Heart disease Father   . Bladder Cancer Mother   . Heart disease Mother     Allergies  Allergen Reactions  . Penicillins     REACTION: unspecified  . Sulfamethoxazole     REACTION: unspecified    Current Outpatient Prescriptions on File Prior to Visit  Medication Sig Dispense Refill  . atorvastatin (LIPITOR) 80 MG tablet TAKE ONE TABLET BY MOUTH EVERY DAY AS DIRECTED  30 tablet  5  . carvedilol (COREG) 6.25 MG tablet TAKE ONE TABLET BY MOUTH TWICE DAILY  180 tablet  0  . fish oil-omega-3 fatty acids 1000 MG  capsule Take 2 g by mouth daily.       Marland Kitchen LANOXIN 250 MCG tablet TAKE ONE-HALF TABLET BY MOUTH EVERY DAY  15 tablet  8  . losartan (COZAAR) 50 MG tablet Take 0.5 tablets (25 mg total) by mouth daily.  90 tablet  3  . mometasone (NASONEX) 50 MCG/ACT nasal spray Place 1 spray into the nose daily as needed.  17 g  3  . Rivaroxaban (XARELTO) 20 MG TABS Take 1 tablet (20 mg total) by mouth daily.  30 tablet  5   No current facility-administered medications on file prior to visit.     patient denies chest pain, shortness of breath, orthopnea. Denies lower extremity edema, abdominal pain, change in appetite, change in bowel movements. Patient denies rashes, musculoskeletal complaints. No other specific complaints in a complete review of systems.   BP 114/70  Pulse 64  Temp(Src) 98.2 F (36.8 C) (Oral)  Ht 5' 11.25" (1.81 m)  Wt 204 lb (92.534 kg)  BMI 28.25 kg/m2  well-developed well-nourished male in no acute distress. HEENT exam atraumatic, normocephalic, neck supple without jugular venous distention. Chest clear to auscultation cardiac exam S1-S2  are irregular. Abdominal exam overweight with bowel sounds, soft and nontender. Extremities no edema. Neurologic exam is alert with a normal gait.

## 2013-10-14 NOTE — Assessment & Plan Note (Signed)
No sxs of CAD Aggressive risk factor modification

## 2013-10-14 NOTE — Assessment & Plan Note (Signed)
No sxs Continue same meds Has ICD

## 2013-10-14 NOTE — Assessment & Plan Note (Signed)
Lab Results  Component Value Date   HGBA1C 6.9* 10/12/2013   Controlled- currently not an any hypoglycemics

## 2013-10-14 NOTE — Assessment & Plan Note (Signed)
BP Readings from Last 3 Encounters:  10/12/13 114/70  08/01/13 112/68  06/07/13 104/70   Controlled- continue same meds

## 2013-11-07 ENCOUNTER — Other Ambulatory Visit: Payer: Self-pay | Admitting: *Deleted

## 2013-11-07 MED ORDER — CARVEDILOL 6.25 MG PO TABS: ORAL_TABLET | ORAL | Status: DC

## 2013-11-19 ENCOUNTER — Ambulatory Visit: Payer: Medicare Other | Admitting: Internal Medicine

## 2013-12-06 LAB — HM DIABETES FOOT EXAM

## 2013-12-06 LAB — HM DIABETES EYE EXAM

## 2013-12-10 ENCOUNTER — Telehealth: Payer: Self-pay | Admitting: Internal Medicine

## 2013-12-10 NOTE — Telephone Encounter (Signed)
Instructions given to patient for remote transmission to be sent 12/12/13.

## 2013-12-10 NOTE — Telephone Encounter (Signed)
New Problem:  Pt has questions about doing his first remote device check. Pt would like a call back

## 2013-12-12 ENCOUNTER — Ambulatory Visit (INDEPENDENT_AMBULATORY_CARE_PROVIDER_SITE_OTHER): Payer: Medicare HMO | Admitting: *Deleted

## 2013-12-12 DIAGNOSIS — I472 Ventricular tachycardia, unspecified: Secondary | ICD-10-CM

## 2013-12-12 DIAGNOSIS — I4891 Unspecified atrial fibrillation: Secondary | ICD-10-CM

## 2013-12-12 DIAGNOSIS — I4729 Other ventricular tachycardia: Secondary | ICD-10-CM

## 2013-12-12 LAB — MDC_IDC_ENUM_SESS_TYPE_REMOTE
Brady Statistic RV Percent Paced: 2.36 %
Date Time Interrogation Session: 20150107093828
HighPow Impedance: 80 Ohm
Lead Channel Impedance Value: 496 Ohm
Lead Channel Setting Pacing Pulse Width: 0.8 ms
Lead Channel Setting Sensing Sensitivity: 0.3 mV
MDC IDC MSMT BATTERY VOLTAGE: 3.03 V
MDC IDC MSMT LEADCHNL RV SENSING INTR AMPL: 8.8026
MDC IDC SET LEADCHNL RV PACING AMPLITUDE: 2.5 V
Zone Setting Detection Interval: 280 ms
Zone Setting Detection Interval: 330 ms
Zone Setting Detection Interval: 430 ms

## 2013-12-20 ENCOUNTER — Encounter: Payer: Self-pay | Admitting: *Deleted

## 2013-12-26 ENCOUNTER — Encounter: Payer: Self-pay | Admitting: Internal Medicine

## 2014-01-05 ENCOUNTER — Other Ambulatory Visit: Payer: Self-pay | Admitting: Internal Medicine

## 2014-01-18 ENCOUNTER — Encounter: Payer: Self-pay | Admitting: Internal Medicine

## 2014-02-01 ENCOUNTER — Ambulatory Visit (INDEPENDENT_AMBULATORY_CARE_PROVIDER_SITE_OTHER): Payer: Commercial Managed Care - HMO | Admitting: Nurse Practitioner

## 2014-02-01 ENCOUNTER — Encounter: Payer: Self-pay | Admitting: Nurse Practitioner

## 2014-02-01 VITALS — BP 109/66 | HR 53 | Ht 71.5 in | Wt 214.0 lb

## 2014-02-01 DIAGNOSIS — E119 Type 2 diabetes mellitus without complications: Secondary | ICD-10-CM

## 2014-02-01 DIAGNOSIS — I4891 Unspecified atrial fibrillation: Secondary | ICD-10-CM

## 2014-02-01 DIAGNOSIS — I6350 Cerebral infarction due to unspecified occlusion or stenosis of unspecified cerebral artery: Secondary | ICD-10-CM

## 2014-02-01 DIAGNOSIS — I639 Cerebral infarction, unspecified: Secondary | ICD-10-CM

## 2014-02-01 DIAGNOSIS — I10 Essential (primary) hypertension: Secondary | ICD-10-CM

## 2014-02-01 NOTE — Progress Notes (Signed)
PATIENT: Carl Wood DOB: 26-Apr-1941   REASON FOR VISIT: follow up for stroke HISTORY FROM: patient  HISTORY OF PRESENT ILLNESS: Carl Wood is an 73 y.o. male history of pulmonary embolism, atrial fibrillation, on Coumadin, congestive heart failure, diabetes mellitus, hypertension and hyperlipidemia, presenting with onset of speech difficulty and right-sided weakness on 12/19/2012. Has no previous history of stroke nor TIA. Patient has been on Coumadin long-term Coumadin suspended 2 weeks ago for colonoscopy. Coumadin was restarted on 12/11/2012. INR today was 1.49. Initial CT scan showed no acute intracranial abnormality. Initial NIH stroke score was 5. Patient improved in the emergency room to a stroke score of 1. Thrombolytic therapy with IV TPA was not elected because of the minimal residual neurologic deficits. Shortly before noon patient had a relapse of deficits with expressive aphasia and right hemiplegia. Repeat CT scan showed hypodensity involving the left MCA indicative of intravascular thrombus. Patient was taken to interventional radiology. Cerebral angiography demonstrated thrombosis involving the left internal carotid artery just above the common carotid artery bifurcation and extending into the middle cerebral and anterior cerebral artery branches. Patient subsequently underwent intra-arterial TPA infusion followed by thrombectomy. MCA territory circulation was restored and all branches. Patient had good collateral filling of the left ACA via the anterior communicating artery.   Update 05/01/13: Carl Wood Wood in today for 3 months stroke follow up. He is doing well and has no residual right-sided weakness or speech difficulty. He states he has a feeling of something caught in the back of his throat often, which causes a nagging cough, but he swallows fine. He states Carl Wood stopped his Enalapril thinking it may be causing the cough, but he still has it. His risk  factors of Diabetes are controlled with weight loss and low carb diet, HgbA1c is 6.5. Hyperlipidemia is well controlled on Lipitor, LDL 61, total cholesterol 113. Patient was discharged on Xarelto for long-standing afib. Patient denies medication side effects, with no signs of bleeding. NIHSS 0, MRS 1.   UPDATE 08/01/13 (LL): Patient returns for follow up, last visit on 05/01/13. Reports he is doing fine, no new neurovascular symptoms. BP well controlled, actually had dose reduced due to hypotension. Blood sugars are well controlled. Patient on Xarelto for long-standing afib. Patient denies medication side effects, with no signs of bleeding or excessive bruising. Still has nagging cough, feeling of phlegm in throat. Was prescribed Nasonex but too expensive. Recommended trying generic Flonase.  UPDATE 02/01/14 (LL): Carl Wood returns for stroke followup.  He is doing well and has no complaints.  His BP and cholesterol are well controlled.  He is tolerating Xarelto well without any significant bleeding or bruising.  REVIEW OF SYSTEMS: Full 14 system review of systems performed and notable only for: No complaints.  ALLERGIES: Allergies  Allergen Reactions  . Penicillins     REACTION: unspecified  . Sulfamethoxazole     REACTION: unspecified    HOME MEDICATIONS: Outpatient Prescriptions Prior to Visit  Medication Sig Dispense Refill  . atorvastatin (LIPITOR) 80 MG tablet TAKE ONE TABLET BY MOUTH EVERY DAY AS DIRECTED  30 tablet  5  . carvedilol (COREG) 6.25 MG tablet TAKE ONE TABLET BY MOUTH TWICE DAILY  180 tablet  1  . fish oil-omega-3 fatty acids 1000 MG capsule Take 2 g by mouth daily.       Marland Kitchen LANOXIN 250 MCG tablet TAKE ONE-HALF TABLET BY MOUTH EVERY DAY  15 tablet  8  . losartan (COZAAR)  50 MG tablet Take 0.5 tablets (25 mg total) by mouth daily.  90 tablet  3  . mometasone (NASONEX) 50 MCG/ACT nasal spray Place 1 spray into the nose daily as needed.  17 g  3  . XARELTO 20 MG TABS tablet  TAKE ONE TABLET BY MOUTH ONCE DAILY  30 tablet  0   No facility-administered medications prior to visit.    PAST MEDICAL HISTORY: Past Medical History  Diagnosis Date  . PE (pulmonary embolism)   . Colon polyps   . CHF (congestive heart failure)   . CAD (coronary artery disease)   . Diabetes mellitus   . Diverticulosis of colon   . Hyperlipidemia   . Hypertension   . MI (mitral incompetence)   . Nephrolithiasis   . V-tach     PAST SURGICAL HISTORY: Past Surgical History  Procedure Laterality Date  . Back surgery    . Cardiac defibrillator placement      medtronic virtuoso  . Basal cell carcinoma excision      nose  . Knee arthroplasty    . Cholecystectomy    . Colonoscopy  12/11/2012    Procedure: COLONOSCOPY;  Surgeon: Inda Castle, MD;  Location: [facility];  Service: Endoscopy;  Laterality: N/A;    FAMILY HISTORY: Family History  Problem Relation Age of Onset  . Colon cancer Father   . Diabetes Father   . Heart disease Father   . Bladder Cancer Mother   . Heart disease Mother     SOCIAL HISTORY: History   Social History  . Marital Status: Married    Spouse Name: N/A    Number of Children: 3  . Years of Education: N/A   Occupational History  . Retired    Social History Main Topics  . Smoking status: Never Smoker   . Smokeless tobacco: Never Used  . Alcohol Use: No  . Drug Use: No  . Sexual Activity: Not on file   Other Topics Concern  . Not on file   Social History Narrative  . No narrative on file     PHYSICAL EXAM  Filed Vitals:   02/01/14 1308  BP: 109/66  Pulse: 53  Height: 5' 11.5" (1.816 m)  Weight: 214 lb (97.07 kg)   Body mass index is 29.43 kg/(m^2).  GENERAL EXAM: Patient is in no distress, well developed and well groomed.  HEAD: Symmetric facial features.  EARS, NOSE, and THROAT: Normal.  NECK: Supple, no JVD  RESPIRATORY: expiratory wheeze, LUL, otherwise clear  CARDIOVASCULAR: Irregular rate and rhythm, no  murmurs, no carotid bruits  SKIN: No rash, no bruising   NEUROLOGIC:  MENTAL STATUS: awake, alert and oriented to person, place and time, language fluent, comprehension intact, naming intact  CRANIAL NERVE: pupils equal and reactive to light, visual fields full to confrontation, extraocular muscles intact, no nystagmus, facial sensation and strength symmetric, uvula midline, shoulder shrug symmetric, tongue midline.  MOTOR: normal bulk and tone, full strength in the BUE, BLE  SENSORY: normal and symmetric to light touch, pinprick, temperature, vibration and proprioception  COORDINATION: finger-nose-finger, fine finger movements normal  REFLEXES: deep tendon reflexes present and symmetric 2+, Babinski is mute, no clonus is noted.  GAIT/STATION: narrow based gait; able to walk on toes, heels and tandem; romberg is negative. No assistive device. DIAGNOSTIC DATA (LABS, IMAGING, TESTING) - I reviewed patient records, labs, notes, testing and imaging myself where available.  Lab Results  Component Value Date   WBC 5.5 04/02/2013  HGB 14.4 04/02/2013   HCT 43.4 04/02/2013   MCV 94.8 04/02/2013   PLT 144.0* 04/02/2013      Component Value Date/Time   NA 139 10/12/2013 0921   K 4.4 10/12/2013 0921   CL 103 10/12/2013 0921   CO2 31 10/12/2013 0921   GLUCOSE 126* 10/12/2013 0921   GLUCOSE 98 10/10/2006 1113   BUN 14 10/12/2013 0921   CREATININE 0.8 10/12/2013 0921   CALCIUM 9.6 10/12/2013 0921   PROT 7.6 05/28/2013 1010   ALBUMIN 4.0 05/28/2013 1010   AST 22 05/28/2013 1010   ALT 19 05/28/2013 1010   ALKPHOS 71 05/28/2013 1010   BILITOT 1.0 05/28/2013 1010   GFRNONAA >90 12/21/2012 0500   GFRAA >90 12/21/2012 0500   Lab Results  Component Value Date   CHOL 132 05/28/2013   HDL 40.50 05/28/2013   LDLCALC 79 05/28/2013   TRIG 61.0 05/28/2013   CHOLHDL 3 05/28/2013   Lab Results  Component Value Date   HGBA1C 6.9* 10/12/2013    ASSESSMENT AND PLAN Mr. Bartholf is a pleasant 73 year old gentleman who  suffered left hemispheric infarct on 12/19/12. From occlusion of the terminal left internal carotid artery, followed by complete revascularization of occlusion of the LT ICA terminus., using 7.4 mg of IA TPA and the trevoprovue retrieval device. Post procedure Imaging confirmed a relatively small left basal ganglia infarct considering he had a ICA occlusion. Infarct felt to be embolic secondary to left ICA occlusion associated with atrial fibrillation. On warfarin prior to admission for atrial fibrillation, INR subtherapeutic at 1.49. After discussion with Carl Wood, changed to xarelto 20 mg daily for secondary stroke prevention. Right-sided weakness and speech difficulties have resolved. Doing well.  PLAN:  Continue Xarelto 20 mg daily for secondary stroke prevention, chronic afib and maintain strict control of hypertension with blood pressure goal below 130/90, diabetes with hemoglobin A1c goal below 6.5% and lipids with LDL cholesterol goal below 100 mg/dL.  Carotid Doppler Study - would like to have it Dr. Olin Pia office at next appointment in April. Followup in 6 months, must see Dr. Leonie Man next visit (Medicare)  Rudi Rummage Karma Ansley, MSN, NP-C 02/01/2014, 1:22 PM [facility] [address] [place], [place] [address] [phone]  Note: This document was prepared with digital dictation and possible smart phrase technology. Any transcriptional errors that result from this process are unintentional.

## 2014-02-01 NOTE — Patient Instructions (Signed)
PLAN:  Continue Xarelto 20 mg daily for secondary stroke prevention, chronic afib and maintain strict control of hypertension with blood pressure goal below 130/90, diabetes with hemoglobin A1c goal below 6.5% and lipids with LDL cholesterol goal below 100 mg/dL.   You should have a CAROTID DOPPLER STUDY, this may be done at our office or at Dr. Olin Pia office.  If you would like toi have it done here, please call to schedule.  Followup in 6 months, must see Dr. Leonie Man next visit

## 2014-02-05 ENCOUNTER — Telehealth: Payer: Self-pay | Admitting: Internal Medicine

## 2014-02-05 DIAGNOSIS — I639 Cerebral infarction, unspecified: Secondary | ICD-10-CM

## 2014-02-05 NOTE — Telephone Encounter (Signed)
New problem   Pt stated he want to have an carotid test done. Please call pt concerning this.

## 2014-02-06 NOTE — Telephone Encounter (Signed)
lmtcb

## 2014-02-07 NOTE — Telephone Encounter (Signed)
Follow up  ° ° ° °Returning call back to nurse  °

## 2014-02-08 NOTE — Telephone Encounter (Signed)
Follow up ° ° ° ° °Returning a nurses call °

## 2014-02-08 NOTE — Telephone Encounter (Signed)
Pt would like carotid doppler done here/ requesting for the same day as his app 03/18/14 with Dr Caryl Comes. Pt has had a stroke and is seen by neuro/ they would like it done and ordered it but pt prefers it be to be done here.

## 2014-02-11 ENCOUNTER — Other Ambulatory Visit: Payer: Self-pay | Admitting: Internal Medicine

## 2014-02-11 NOTE — Addendum Note (Signed)
Addended by: Jonathon Jordan on: 02/11/2014 12:29 PM   Modules accepted: Orders

## 2014-02-11 NOTE — Telephone Encounter (Signed)
plz arrange carotids

## 2014-02-11 NOTE — Telephone Encounter (Signed)
Procedure ordered/ msg to [facility] and Sherri Rn.

## 2014-03-09 ENCOUNTER — Other Ambulatory Visit: Payer: Self-pay | Admitting: Internal Medicine

## 2014-03-14 ENCOUNTER — Encounter: Payer: Medicare HMO | Admitting: Internal Medicine

## 2014-03-14 ENCOUNTER — Ambulatory Visit (INDEPENDENT_AMBULATORY_CARE_PROVIDER_SITE_OTHER): Payer: Medicare HMO | Admitting: Family Medicine

## 2014-03-14 ENCOUNTER — Encounter: Payer: Self-pay | Admitting: Family Medicine

## 2014-03-14 VITALS — BP 110/70 | Temp 97.6°F | Wt 214.0 lb

## 2014-03-14 DIAGNOSIS — M6530 Trigger finger, unspecified finger: Secondary | ICD-10-CM | POA: Insufficient documentation

## 2014-03-14 NOTE — Progress Notes (Signed)
   Subjective:    Patient ID: Carl Wood, male    DOB: 1941/08/30, 73 y.o.   MRN: [id number]  HPI Carl Wood is a 73 year old male who comes in today with a nine-month history of a trigger finger right ring. No history of trauma   Review of Systems Review of systems negative    Objective:   Physical Exam Well-developed well nourished in no acute distress vital signs stable is afebrile examination hand shows a trigger finger right ring finger otherwise hand exam normal       Assessment & Plan:  Trigger finger right ring refer to hand surgeon

## 2014-03-14 NOTE — Progress Notes (Signed)
Pre visit review using our clinic review tool, if applicable. No additional management support is needed unless otherwise documented below in the visit note. 

## 2014-03-14 NOTE — Patient Instructions (Signed)
Call Dr. Herbie Baltimore sypher,,,,,,,,,,,, at the [facility]

## 2014-03-18 ENCOUNTER — Encounter: Payer: Self-pay | Admitting: Internal Medicine

## 2014-03-18 ENCOUNTER — Ambulatory Visit (INDEPENDENT_AMBULATORY_CARE_PROVIDER_SITE_OTHER): Payer: Medicare HMO | Admitting: Internal Medicine

## 2014-03-18 ENCOUNTER — Ambulatory Visit (HOSPITAL_COMMUNITY): Payer: Medicare HMO | Attending: Internal Medicine | Admitting: *Deleted

## 2014-03-18 ENCOUNTER — Encounter (HOSPITAL_COMMUNITY): Payer: Medicare HMO

## 2014-03-18 VITALS — BP 125/70 | HR 60 | Ht 72.0 in | Wt 209.0 lb

## 2014-03-18 DIAGNOSIS — I6529 Occlusion and stenosis of unspecified carotid artery: Secondary | ICD-10-CM | POA: Diagnosis not present

## 2014-03-18 DIAGNOSIS — G459 Transient cerebral ischemic attack, unspecified: Secondary | ICD-10-CM | POA: Insufficient documentation

## 2014-03-18 DIAGNOSIS — I639 Cerebral infarction, unspecified: Secondary | ICD-10-CM

## 2014-03-18 DIAGNOSIS — I4729 Other ventricular tachycardia: Secondary | ICD-10-CM

## 2014-03-18 DIAGNOSIS — Z8673 Personal history of transient ischemic attack (TIA), and cerebral infarction without residual deficits: Secondary | ICD-10-CM | POA: Insufficient documentation

## 2014-03-18 DIAGNOSIS — Z95810 Presence of automatic (implantable) cardiac defibrillator: Secondary | ICD-10-CM

## 2014-03-18 DIAGNOSIS — I472 Ventricular tachycardia: Secondary | ICD-10-CM

## 2014-03-18 DIAGNOSIS — I2589 Other forms of chronic ischemic heart disease: Secondary | ICD-10-CM

## 2014-03-18 LAB — MDC_IDC_ENUM_SESS_TYPE_INCLINIC
Battery Voltage: 3.03 V
Brady Statistic RV Percent Paced: 1.95 %
Date Time Interrogation Session: 20150413124937
HIGH POWER IMPEDANCE MEASURED VALUE: 80 Ohm
Lead Channel Pacing Threshold Amplitude: 1.5 V
Lead Channel Pacing Threshold Pulse Width: 0.8 ms
Lead Channel Sensing Intrinsic Amplitude: 10.5 mV
Lead Channel Setting Pacing Amplitude: 2.5 V
Lead Channel Setting Pacing Pulse Width: 0.8 ms
Lead Channel Setting Sensing Sensitivity: 0.3 mV
MDC IDC MSMT LEADCHNL RV IMPEDANCE VALUE: 480 Ohm
MDC IDC SET ZONE DETECTION INTERVAL: 430 ms
Zone Setting Detection Interval: 280 ms
Zone Setting Detection Interval: 330 ms

## 2014-03-18 LAB — BASIC METABOLIC PANEL
BUN: 15 mg/dL (ref 6–23)
CHLORIDE: 101 meq/L (ref 96–112)
CO2: 28 mEq/L (ref 19–32)
Calcium: 9.5 mg/dL (ref 8.4–10.5)
Creatinine, Ser: 0.9 mg/dL (ref 0.4–1.5)
GFR: 85.24 mL/min (ref >60.00)
Glucose, Bld: 158 mg/dL — ABNORMAL HIGH (ref 70–99)
POTASSIUM: 4.6 meq/L (ref 3.5–5.1)
SODIUM: 139 meq/L (ref 135–145)

## 2014-03-18 MED ORDER — LANOXIN 250 MCG PO TABS: ORAL_TABLET | ORAL | Status: DC

## 2014-03-18 NOTE — Progress Notes (Signed)
Carotid Duplex complete 

## 2014-03-18 NOTE — Patient Instructions (Addendum)
Your physician recommends that you return for lab work today: Digoxin level, BMET  Remote monitoring is used to monitor your Pacemaker of ICD from home. This monitoring reduces the number of office visits required to check your device to one time per year. It allows [place] to keep an eye on the functioning of your device to ensure it is working properly. You are scheduled for a device check from home on 06/19/14. You may send your transmission at any time that day. If you have a wireless device, the transmission will be sent automatically. After your physician reviews your transmission, you will receive a postcard with your next transmission date.  Your physician wants you to follow-up in: 1 year with Dr. Caryl Comes.  You will receive a reminder letter in the mail two months in advance. If you don't receive a letter, please call our office to schedule the follow-up appointment.  You were given 2 weeks of samples of Xarelto   Please call us with decision on sleep study -- [phone]

## 2014-03-18 NOTE — Progress Notes (Signed)
Patient Care Team: Lisabeth Pick, MD as PCP - General   HPI  Carl Wood is a 73 y.o. male  is seen in followup for ischemic heart disease with ICD implanted for ventricular tachycardia. He has history of bypass grafting and depressed left ventricular function. He has a history of recurrent ventricular tachycardia--most recently polymorphic in september 2101 associated with low-normal magnesium.  He underwent a Myoview scanning demonstrated ejection fraction of 27% without ischemia but with a large LAD infarct. He has permanent atrial fibrillation.  The patient denies SOB, Exertional chest pain, edema or palpitations  He had a stroke in Jan 2014 from which he has making a recovery  Past Medical History  Diagnosis Date  . PE (pulmonary embolism)   . Colon polyps   . CHF (congestive heart failure)   . CAD (coronary artery disease)   . Diabetes mellitus   . Diverticulosis of colon   . Hyperlipidemia   . Hypertension   . MI (mitral incompetence)   . Nephrolithiasis   . V-tach     Past Surgical History  Procedure Laterality Date  . Back surgery    . Cardiac defibrillator placement      medtronic virtuoso  . Basal cell carcinoma excision      nose  . Knee arthroplasty    . Cholecystectomy    . Colonoscopy  12/11/2012    Procedure: COLONOSCOPY;  Surgeon: Inda Castle, MD;  Location: [facility];  Service: Endoscopy;  Laterality: N/A;    Current Outpatient Prescriptions  Medication Sig Dispense Refill  . atorvastatin (LIPITOR) 80 MG tablet TAKE ONE TABLET BY MOUTH EVERY DAY AS DIRECTED  30 tablet  5  . carvedilol (COREG) 6.25 MG tablet TAKE ONE TABLET BY MOUTH TWICE DAILY  180 tablet  1  . fish oil-omega-3 fatty acids 1000 MG capsule Take 2 g by mouth daily.       Marland Kitchen LANOXIN 250 MCG tablet TAKE ONE-HALF TABLET BY MOUTH EVERY DAY  15 tablet  8  . losartan (COZAAR) 50 MG tablet Take 0.5 tablets (25 mg total) by mouth daily.  90 tablet  3  . mometasone (NASONEX)  50 MCG/ACT nasal spray Place 1 spray into the nose daily as needed.  17 g  3  . XARELTO 20 MG TABS tablet TAKE ONE TABLET BY MOUTH ONCE DAILY  30 tablet  0   No current facility-administered medications for this visit.    Allergies  Allergen Reactions  . Penicillins     REACTION: unspecified  . Sulfamethoxazole     REACTION: unspecified    Review of Systems negative except from HPI and PMH  Physical Exam BP 125/70  Pulse 60  Ht 6' (1.829 m)  Wt 209 lb (94.802 kg)  BMI 28.34 kg/m2 Well developed and well nourished in no acute distress HENT normal E scleral and icterus clear Neck Supple JVP flat; carotids brisk and full Clear to ausculation Irregular rate and rhythm, no murmurs gallops or rub Soft with active bowel sounds No clubbing cyanosis  Edema Alert and oriented, grossly normal motor and sensory function Skin Warm and Dry    Assessment and  Plan  Atrial fibrillation-permanent  Fatigue daytime somnolence and snoring  Ischemic cardiomyopathy prior bypass surgery  ICD-Medtronic The patient's device was interrogated.  The information was reviewed. No changes were made in the programming.     His snoring and is fatigue may be related to sleep apnea. It could  also be related to his stroke. He is euvolemic so I don't think this is heart failure.  We will check his digoxin level and his electrolytes. We have also talked about undertaking a sleep study. He is considering this. Optivol index is reasonable.

## 2014-03-19 ENCOUNTER — Other Ambulatory Visit: Payer: Self-pay | Admitting: *Deleted

## 2014-03-19 ENCOUNTER — Telehealth: Payer: Self-pay | Admitting: Internal Medicine

## 2014-03-19 LAB — DIGOXIN LEVEL: Digoxin Level: 0.4 ng/mL — ABNORMAL LOW (ref 0.8–2.0)

## 2014-03-19 NOTE — Telephone Encounter (Signed)
New message     Need more info regarding sleep study

## 2014-03-19 NOTE — Telephone Encounter (Signed)
Pt inquiring about where he will have study at - informed study is through hospital. He voiced understanding and will call back with decision.

## 2014-03-25 ENCOUNTER — Telehealth: Payer: Self-pay | Admitting: Internal Medicine

## 2014-03-25 ENCOUNTER — Other Ambulatory Visit: Payer: Self-pay | Admitting: *Deleted

## 2014-03-25 MED ORDER — DIGOXIN 250 MCG PO TABS: ORAL_TABLET | ORAL | Status: DC

## 2014-03-25 NOTE — Telephone Encounter (Signed)
New message     What did Dr Caryl Comes find out from the neurologist?

## 2014-03-25 NOTE — Telephone Encounter (Signed)
lmtcb

## 2014-03-25 NOTE — Telephone Encounter (Signed)
New rx for Digoxin sent to pharmacy, per pt request. Explained that Dr. Caryl Comes is out of the office until the end of the month, and when he returns I will ask him about talking with the neurologist. Pt is agreeable to this plan.

## 2014-04-02 ENCOUNTER — Encounter: Payer: Self-pay | Admitting: Internal Medicine

## 2014-04-02 ENCOUNTER — Ambulatory Visit (INDEPENDENT_AMBULATORY_CARE_PROVIDER_SITE_OTHER): Payer: Commercial Managed Care - HMO | Admitting: Internal Medicine

## 2014-04-02 ENCOUNTER — Telehealth: Payer: Self-pay | Admitting: Internal Medicine

## 2014-04-02 VITALS — BP 122/70 | HR 72 | Temp 98.1°F | Ht 72.0 in | Wt 215.0 lb

## 2014-04-02 DIAGNOSIS — E785 Hyperlipidemia, unspecified: Secondary | ICD-10-CM

## 2014-04-02 DIAGNOSIS — I2589 Other forms of chronic ischemic heart disease: Secondary | ICD-10-CM

## 2014-04-02 DIAGNOSIS — M79641 Pain in right hand: Secondary | ICD-10-CM

## 2014-04-02 DIAGNOSIS — I2510 Atherosclerotic heart disease of native coronary artery without angina pectoris: Secondary | ICD-10-CM

## 2014-04-02 DIAGNOSIS — E119 Type 2 diabetes mellitus without complications: Secondary | ICD-10-CM

## 2014-04-02 LAB — BASIC METABOLIC PANEL
BUN: 16 mg/dL (ref 6–23)
CALCIUM: 9.5 mg/dL (ref 8.4–10.5)
CO2: 29 meq/L (ref 19–32)
CREATININE: 0.9 mg/dL (ref 0.4–1.5)
Chloride: 104 mEq/L (ref 96–112)
GFR: 93.29 mL/min (ref >60.00)
Glucose, Bld: 137 mg/dL — ABNORMAL HIGH (ref 70–99)
Potassium: 4.6 mEq/L (ref 3.5–5.1)
Sodium: 140 mEq/L (ref 135–145)

## 2014-04-02 LAB — LIPID PANEL
CHOLESTEROL: 113 mg/dL (ref 0–200)
HDL: 34.1 mg/dL — ABNORMAL LOW (ref >39.00)
LDL Cholesterol: 66 mg/dL (ref 0–99)
TRIGLYCERIDES: 65 mg/dL (ref 0.0–149.0)
Total CHOL/HDL Ratio: 3
VLDL: 13 mg/dL (ref 0.0–40.0)

## 2014-04-02 LAB — HEPATIC FUNCTION PANEL
ALT: 22 U/L (ref 0–53)
AST: 21 U/L (ref 0–37)
Albumin: 4.1 g/dL (ref 3.5–5.2)
Alkaline Phosphatase: 69 U/L (ref 39–117)
BILIRUBIN DIRECT: 0.2 mg/dL (ref 0.0–0.3)
TOTAL PROTEIN: 7.3 g/dL (ref 6.0–8.3)
Total Bilirubin: 1.1 mg/dL (ref 0.3–1.2)

## 2014-04-02 LAB — MICROALBUMIN / CREATININE URINE RATIO
Creatinine,U: 149.9 mg/dL
MICROALB/CREAT RATIO: 0.5 mg/g (ref 0.0–30.0)
Microalb, Ur: 0.7 mg/dL (ref 0.0–1.9)

## 2014-04-02 LAB — HEMOGLOBIN A1C: HEMOGLOBIN A1C: 6.8 % — AB (ref 4.6–6.5)

## 2014-04-02 MED ORDER — RIVAROXABAN 20 MG PO TABS: ORAL_TABLET | ORAL | Status: DC

## 2014-04-02 NOTE — Telephone Encounter (Signed)
Pt called back and said the [facility] on [address] does not accept [place] insurance  His question is who else do you recommend for his hand problem

## 2014-04-02 NOTE — Progress Notes (Signed)
CAD- no sxs  CHF- no SOB, PND, orhopnea  AFIB- followed by dr. Caryl Comes (xarelto)  Hyperglycemia- reviewed labs  Past Medical History  Diagnosis Date  . PE (pulmonary embolism)   . Colon polyps   . CHF (congestive heart failure)   . CAD (coronary artery disease)   . Diabetes mellitus   . Diverticulosis of colon   . Hyperlipidemia   . Hypertension   . MI (mitral incompetence)   . Nephrolithiasis   . V-tach     History   Social History  . Marital Status: Married    Spouse Name: N/A    Number of Children: 3  . Years of Education: N/A   Occupational History  . Retired    Social History Main Topics  . Smoking status: Never Smoker   . Smokeless tobacco: Never Used  . Alcohol Use: No  . Drug Use: No  . Sexual Activity: Not on file   Other Topics Concern  . Not on file   Social History Narrative  . No narrative on file    Past Surgical History  Procedure Laterality Date  . Back surgery    . Cardiac defibrillator placement      medtronic virtuoso  . Basal cell carcinoma excision      nose  . Knee arthroplasty    . Cholecystectomy    . Colonoscopy  12/11/2012    Procedure: COLONOSCOPY;  Surgeon: Inda Castle, MD;  Location: [facility];  Service: Endoscopy;  Laterality: N/A;    Family History  Problem Relation Age of Onset  . Colon cancer Father   . Diabetes Father   . Heart disease Father   . Bladder Cancer Mother   . Heart disease Mother     Allergies  Allergen Reactions  . Penicillins     REACTION: unspecified  . Sulfamethoxazole     REACTION: unspecified    Current Outpatient Prescriptions on File Prior to Visit  Medication Sig Dispense Refill  . atorvastatin (LIPITOR) 80 MG tablet TAKE ONE TABLET BY MOUTH EVERY DAY AS DIRECTED  30 tablet  5  . carvedilol (COREG) 6.25 MG tablet TAKE ONE TABLET BY MOUTH TWICE DAILY  180 tablet  1  . digoxin (LANOXIN) 0.25 MG tablet Take one-half tablet by mouth daily  90 tablet  3  . losartan (COZAAR) 50 MG  tablet Take 0.5 tablets (25 mg total) by mouth daily.  90 tablet  3  . mometasone (NASONEX) 50 MCG/ACT nasal spray Place 1 spray into the nose daily as needed.  17 g  3   No current facility-administered medications on file prior to visit.     patient denies chest pain, shortness of breath, orthopnea. Denies lower extremity edema, abdominal pain, change in appetite, change in bowel movements. Patient denies rashes, musculoskeletal complaints. No other specific complaints in a complete review of systems.   BP 122/70  Pulse 72  Temp(Src) 98.1 F (36.7 C) (Oral)  Ht 6' (1.829 m)  Wt 215 lb (97.523 kg)  BMI 29.15 kg/m2  well-developed well-nourished male in no acute distress. HEENT exam atraumatic, normocephalic, neck supple without jugular venous distention. Chest clear to auscultation cardiac exam S1-S2 are regular. Abdominal exam overweight with bowel sounds, soft and nontender. Extremities no edema. Neurologic exam is alert with a normal gait.

## 2014-04-02 NOTE — Progress Notes (Signed)
Pre visit review using our clinic review tool, if applicable. No additional management support is needed unless otherwise documented below in the visit note. 

## 2014-04-04 ENCOUNTER — Telehealth: Payer: Self-pay | Admitting: Internal Medicine

## 2014-04-04 NOTE — Telephone Encounter (Signed)
Dr. Caryl Comes left message, yesterday afternoon (4/29), for patient to call back.  Dr. Caryl Comes is unsure as to why he was to contact neurologist.

## 2014-04-04 NOTE — Telephone Encounter (Signed)
New message     Dr Caryl Comes called him yesterday and he was going to check on something and call him back---he said tell Dr Caryl Comes to "forget it and relax".  He did not have to check on "it".  You do not have to call him back.

## 2014-04-05 ENCOUNTER — Telehealth: Payer: Self-pay

## 2014-04-05 NOTE — Telephone Encounter (Signed)
Relevant patient education assigned to patient using Emmi. ° °

## 2014-04-05 NOTE — Telephone Encounter (Signed)
Left message for pt to call back  °

## 2014-04-05 NOTE — Assessment & Plan Note (Signed)
Has regualr f/ with cardiology

## 2014-04-05 NOTE — Assessment & Plan Note (Signed)
He has no concerning sxs

## 2014-04-05 NOTE — Telephone Encounter (Signed)
[facility]- try them

## 2014-04-05 NOTE — Assessment & Plan Note (Signed)
Well controlled Continue same meds 

## 2014-04-09 ENCOUNTER — Telehealth: Payer: Self-pay | Admitting: Internal Medicine

## 2014-04-09 DIAGNOSIS — M79643 Pain in unspecified hand: Secondary | ICD-10-CM

## 2014-04-09 NOTE — Telephone Encounter (Signed)
Pt aware, he will call them and schedule himself.

## 2014-04-09 NOTE — Telephone Encounter (Signed)
Referral order placed.

## 2014-04-09 NOTE — Telephone Encounter (Signed)
Pt states dr. Leanne Chang informed him to go to [facility], pt needs a referral per his insurance.

## 2014-04-11 NOTE — Telephone Encounter (Signed)
Pt called on 4/30 and stated not to worry about it, not to return call.

## 2014-04-12 ENCOUNTER — Ambulatory Visit: Payer: Medicare Other | Admitting: Internal Medicine

## 2014-05-02 ENCOUNTER — Other Ambulatory Visit: Payer: Self-pay | Admitting: *Deleted

## 2014-05-02 MED ORDER — CARVEDILOL 6.25 MG PO TABS: ORAL_TABLET | ORAL | Status: DC

## 2014-05-25 ENCOUNTER — Other Ambulatory Visit: Payer: Self-pay | Admitting: Internal Medicine

## 2014-06-19 ENCOUNTER — Ambulatory Visit (INDEPENDENT_AMBULATORY_CARE_PROVIDER_SITE_OTHER): Payer: Commercial Managed Care - HMO | Admitting: *Deleted

## 2014-06-19 DIAGNOSIS — I472 Ventricular tachycardia: Secondary | ICD-10-CM

## 2014-06-19 DIAGNOSIS — I4729 Other ventricular tachycardia: Secondary | ICD-10-CM | POA: Diagnosis not present

## 2014-06-19 NOTE — Progress Notes (Signed)
Remote ICD transmission.   

## 2014-06-20 LAB — MDC_IDC_ENUM_SESS_TYPE_REMOTE
Brady Statistic RV Percent Paced: 1.77 %
Date Time Interrogation Session: 20150715052406
HighPow Impedance: 82 Ohm
Lead Channel Impedance Value: 472 Ohm
Lead Channel Sensing Intrinsic Amplitude: 8.1254
Lead Channel Setting Sensing Sensitivity: 0.3 mV
MDC IDC MSMT BATTERY VOLTAGE: 3 V
MDC IDC SET LEADCHNL RV PACING AMPLITUDE: 2.5 V
MDC IDC SET LEADCHNL RV PACING PULSEWIDTH: 0.8 ms
MDC IDC SET ZONE DETECTION INTERVAL: 430 ms
Zone Setting Detection Interval: 280 ms
Zone Setting Detection Interval: 330 ms

## 2014-06-24 ENCOUNTER — Other Ambulatory Visit: Payer: Self-pay | Admitting: Internal Medicine

## 2014-06-25 NOTE — Telephone Encounter (Signed)
Deboraha Sprang, MD at 03/18/2014 12:58 PM losartan (COZAAR) 50 MG tablet  Take 0.5 tablets (25 mg total) by mouth daily.

## 2014-06-25 NOTE — Telephone Encounter (Signed)
His PCP filled this last -  earlier this month. This needs to be addressed by PCP office. Thanks :)

## 2014-06-27 ENCOUNTER — Telehealth: Payer: Self-pay | Admitting: Internal Medicine

## 2014-06-27 DIAGNOSIS — I509 Heart failure, unspecified: Secondary | ICD-10-CM

## 2014-06-27 DIAGNOSIS — I4729 Other ventricular tachycardia: Secondary | ICD-10-CM

## 2014-06-27 DIAGNOSIS — I472 Ventricular tachycardia: Secondary | ICD-10-CM

## 2014-06-27 DIAGNOSIS — I2589 Other forms of chronic ischemic heart disease: Secondary | ICD-10-CM

## 2014-06-27 DIAGNOSIS — Z95810 Presence of automatic (implantable) cardiac defibrillator: Secondary | ICD-10-CM

## 2014-06-27 MED ORDER — LOSARTAN POTASSIUM 50 MG PO TABS: 25.0000 mg | ORAL_TABLET | Freq: Every day | ORAL | Status: DC

## 2014-06-27 NOTE — Telephone Encounter (Signed)
[facility] is requesting re-fill on losartan (COZAAR) 50 MG tablet

## 2014-06-27 NOTE — Telephone Encounter (Signed)
rx sent in electronically 

## 2014-06-28 ENCOUNTER — Telehealth: Payer: Self-pay | Admitting: Internal Medicine

## 2014-06-28 DIAGNOSIS — I472 Ventricular tachycardia: Secondary | ICD-10-CM

## 2014-06-28 DIAGNOSIS — I2589 Other forms of chronic ischemic heart disease: Secondary | ICD-10-CM

## 2014-06-28 DIAGNOSIS — Z95810 Presence of automatic (implantable) cardiac defibrillator: Secondary | ICD-10-CM

## 2014-06-28 DIAGNOSIS — I4729 Other ventricular tachycardia: Secondary | ICD-10-CM

## 2014-06-28 DIAGNOSIS — I509 Heart failure, unspecified: Secondary | ICD-10-CM

## 2014-06-28 MED ORDER — LOSARTAN POTASSIUM 50 MG PO TABS: 25.0000 mg | ORAL_TABLET | Freq: Every day | ORAL | Status: DC

## 2014-06-28 NOTE — Telephone Encounter (Signed)
rx sent in electronically 

## 2014-06-28 NOTE — Telephone Encounter (Signed)
Pt needs refill on losartan 50 mg #90 w/refills [facility]

## 2014-07-01 ENCOUNTER — Other Ambulatory Visit: Payer: Self-pay | Admitting: Internal Medicine

## 2014-07-01 ENCOUNTER — Other Ambulatory Visit: Payer: Self-pay | Admitting: *Deleted

## 2014-07-01 DIAGNOSIS — I4729 Other ventricular tachycardia: Secondary | ICD-10-CM

## 2014-07-01 DIAGNOSIS — Z95810 Presence of automatic (implantable) cardiac defibrillator: Secondary | ICD-10-CM

## 2014-07-01 DIAGNOSIS — I472 Ventricular tachycardia: Secondary | ICD-10-CM

## 2014-07-01 DIAGNOSIS — I509 Heart failure, unspecified: Secondary | ICD-10-CM

## 2014-07-01 DIAGNOSIS — I2589 Other forms of chronic ischemic heart disease: Secondary | ICD-10-CM

## 2014-07-01 MED ORDER — LOSARTAN POTASSIUM 50 MG PO TABS: 25.0000 mg | ORAL_TABLET | Freq: Every day | ORAL | Status: DC

## 2014-07-10 ENCOUNTER — Encounter: Payer: Self-pay | Admitting: Cardiology

## 2014-07-15 ENCOUNTER — Encounter: Payer: Self-pay | Admitting: Internal Medicine

## 2014-08-01 ENCOUNTER — Ambulatory Visit: Payer: Commercial Managed Care - HMO | Admitting: Neurology

## 2014-08-06 ENCOUNTER — Ambulatory Visit (INDEPENDENT_AMBULATORY_CARE_PROVIDER_SITE_OTHER): Payer: Commercial Managed Care - HMO | Admitting: Neurology

## 2014-08-06 ENCOUNTER — Encounter: Payer: Self-pay | Admitting: Neurology

## 2014-08-06 ENCOUNTER — Telehealth: Payer: Self-pay | Admitting: *Deleted

## 2014-08-06 VITALS — BP 120/70 | HR 61 | Ht 72.0 in | Wt 214.2 lb

## 2014-08-06 DIAGNOSIS — I6789 Other cerebrovascular disease: Secondary | ICD-10-CM

## 2014-08-06 MED ORDER — RIVAROXABAN 20 MG PO TABS: 20.0000 mg | ORAL_TABLET | Freq: Every day | ORAL | Status: DC

## 2014-08-06 NOTE — Telephone Encounter (Signed)
Patient walked into office requesting samples Xarelto 20 mg #30 given

## 2014-08-06 NOTE — Progress Notes (Signed)
PATIENT: Carl Wood DOB: Mar 19, 1941   REASON FOR VISIT: follow up for stroke HISTORY FROM: patient  HISTORY OF PRESENT ILLNESS: Carl Wood is an 73 y.o. male history of pulmonary embolism, atrial fibrillation, on Coumadin, congestive heart failure, diabetes mellitus, hypertension and hyperlipidemia, presenting with onset of speech difficulty and right-sided weakness on 12/19/2012. Has no previous history of stroke nor TIA. Patient has been on Coumadin long-term Coumadin suspended 2 weeks ago for colonoscopy. Coumadin was restarted on 12/11/2012. INR today was 1.49. Initial CT scan showed no acute intracranial abnormality. Initial NIH stroke score was 5. Patient improved in the emergency room to a stroke score of 1. Thrombolytic therapy with IV TPA was not elected because of the minimal residual neurologic deficits. Shortly before noon patient had a relapse of deficits with expressive aphasia and right hemiplegia. Repeat CT scan showed hypodensity involving the left MCA indicative of intravascular thrombus. Patient was taken to interventional radiology. Cerebral angiography demonstrated thrombosis involving the left internal carotid artery just above the common carotid artery bifurcation and extending into the middle cerebral and anterior cerebral artery branches. Patient subsequently underwent intra-arterial TPA infusion followed by thrombectomy. MCA territory circulation was restored and all branches. Patient had good collateral filling of the left ACA via the anterior communicating artery.   Update 05/01/13: Carl Wood comes in today for 3 months stroke follow up. Carl Wood is doing well and has no residual right-sided weakness or speech difficulty. Carl Wood states Carl Wood has a feeling of something caught in the back of his throat often, which causes a nagging cough, but Carl Wood swallows fine. Carl Wood states Dr. Caryl Comes stopped his Enalapril thinking it may be causing the cough, but Carl Wood still has it. His risk  factors of Diabetes are controlled with weight loss and low carb diet, HgbA1c is 6.5. Hyperlipidemia is well controlled on Lipitor, LDL 61, total cholesterol 113. Patient was discharged on Xarelto for long-standing afib. Patient denies medication side effects, with no signs of bleeding. NIHSS 0, MRS 1.   UPDATE 08/01/13 (LL): Patient returns for follow up, last visit on 05/01/13. Reports Carl Wood is doing fine, no new neurovascular symptoms. BP well controlled, actually had dose reduced due to hypotension. Blood sugars are well controlled. Patient on Xarelto for long-standing afib. Patient denies medication side effects, with no signs of bleeding or excessive bruising. Still has nagging cough, feeling of phlegm in throat. Was prescribed Nasonex but too expensive. Recommended trying generic Flonase.  UPDATE 02/01/14 (LL): Carl Wood returns for stroke followup.  Carl Wood is doing well and has no complaints.  His BP and cholesterol are well controlled.  Carl Wood is tolerating Xarelto well without any significant bleeding or bruising. UPDATE 08/06/2014 : Carl Wood returns for followup of loss was at 6 ago. Carl Wood continues to do well without recurrence stroke or TIA symptoms. Carl Wood is tolerating Xarelto quite well without significant bleeding or bruising. Carl Wood has not had any new medical problems or any medication changes. Carl Wood has no new complaints today. REVIEW OF SYSTEMS: Full 14 system review of systems performed and notable only for: Leg swelling only  ALLERGIES: Allergies  Allergen Reactions  . Penicillins     REACTION: unspecified  . Sulfamethoxazole     REACTION: unspecified    HOME MEDICATIONS: Outpatient Prescriptions Prior to Visit  Medication Sig Dispense Refill  . atorvastatin (LIPITOR) 80 MG tablet TAKE ONE TABLET BY MOUTH EVERY DAY AS DIRECTED  30 tablet  5  . carvedilol (COREG) 6.25 MG tablet TAKE  ONE TABLET BY MOUTH TWICE DAILY  180 tablet  1  . digoxin (LANOXIN) 0.25 MG tablet Take one-half tablet by mouth daily  90  tablet  3  . losartan (COZAAR) 50 MG tablet Take 0.5 tablets (25 mg total) by mouth daily.  90 tablet  3  . mometasone (NASONEX) 50 MCG/ACT nasal spray Place 1 spray into the nose daily as needed.  17 g  3  . rivaroxaban (XARELTO) 20 MG TABS tablet Take 1 tablet (20 mg total) by mouth daily with supper.  30 tablet  0   No facility-administered medications prior to visit.    PAST MEDICAL HISTORY: Past Medical History  Diagnosis Date  . PE (pulmonary embolism)   . Colon polyps   . CHF (congestive heart failure)   . CAD (coronary artery disease)   . Diabetes mellitus   . Diverticulosis of colon   . Hyperlipidemia   . Hypertension   . MI (mitral incompetence)   . Nephrolithiasis   . V-tach     PAST SURGICAL HISTORY: Past Surgical History  Procedure Laterality Date  . Back surgery    . Cardiac defibrillator placement      medtronic virtuoso  . Basal cell carcinoma excision      nose  . Knee arthroplasty    . Cholecystectomy    . Colonoscopy  12/11/2012    Procedure: COLONOSCOPY;  Surgeon: Inda Castle, MD;  Location: [facility];  Service: Endoscopy;  Laterality: N/A;    FAMILY HISTORY: Family History  Problem Relation Age of Onset  . Colon cancer Father   . Diabetes Father   . Heart disease Father   . Bladder Cancer Mother   . Heart disease Mother     SOCIAL HISTORY: History   Social History  . Marital Status: Married    Spouse Name: [name]    Number of Children: 3  . Years of Education: 12   Occupational History  . Retired    Social History Main Topics  . Smoking status: Never Smoker   . Smokeless tobacco: Never Used  . Alcohol Use: No  . Drug Use: No  . Sexual Activity: Not on file   Other Topics Concern  . Not on file   Social History Narrative  . No narrative on file     PHYSICAL EXAM  Filed Vitals:   08/06/14 1122  BP: 120/70  Pulse: 61  Height: 6' (1.829 m)  Weight: 214 lb 3.2 oz (97.16 kg)   Body mass index is 29.04  kg/(m^2).  GENERAL EXAM: Patient is in no distress, well developed and well groomed.  HEAD: Symmetric facial features.  EARS, NOSE, and THROAT: Normal.  NECK: Supple, no JVD  RESPIRATORY: expiratory wheeze, LUL, otherwise clear  CARDIOVASCULAR: Irregular rate and rhythm, no murmurs, no carotid bruits  SKIN: No rash, no bruising   NEUROLOGIC:  MENTAL STATUS: awake, alert and oriented to person, place and time, language fluent, comprehension intact, naming intact  CRANIAL NERVE: pupils equal and reactive to light, visual fields full to confrontation, extraocular muscles intact, no nystagmus, facial sensation and strength symmetric, uvula midline, shoulder shrug symmetric, tongue midline.  MOTOR: normal bulk and tone, full strength in the BUE, BLE  SENSORY: normal and symmetric to light touch, pinprick, temperature, vibration and proprioception  COORDINATION: finger-nose-finger, fine finger movements normal  REFLEXES: deep tendon reflexes present and symmetric 2+, Babinski is mute, no clonus is noted.  GAIT/STATION: narrow based gait; able to walk on toes,  heels and tandem; romberg is negative. No assistive device. DIAGNOSTIC DATA (LABS, IMAGING, TESTING) - I reviewed patient records, labs, notes, testing and imaging myself where available.  Lab Results  Component Value Date   WBC 5.5 04/02/2013   HGB 14.4 04/02/2013   HCT 43.4 04/02/2013   MCV 94.8 04/02/2013   PLT 144.0* 04/02/2013      Component Value Date/Time   NA 140 04/02/2014 0917   K 4.6 04/02/2014 0917   CL 104 04/02/2014 0917   CO2 29 04/02/2014 0917   GLUCOSE 137* 04/02/2014 0917   GLUCOSE 98 10/10/2006 1113   BUN 16 04/02/2014 0917   CREATININE 0.9 04/02/2014 0917   CALCIUM 9.5 04/02/2014 0917   PROT 7.3 04/02/2014 0917   ALBUMIN 4.1 04/02/2014 0917   AST 21 04/02/2014 0917   ALT 22 04/02/2014 0917   ALKPHOS 69 04/02/2014 0917   BILITOT 1.1 04/02/2014 0917   GFRNONAA >90 12/21/2012 0500   GFRAA >90 12/21/2012 0500   Lab Results   Component Value Date   CHOL 113 04/02/2014   HDL 34.10* 04/02/2014   LDLCALC 66 04/02/2014   TRIG 65.0 04/02/2014   CHOLHDL 3 04/02/2014   Lab Results  Component Value Date   HGBA1C 6.8* 04/02/2014    ASSESSMENT AND PLAN Carl Wood is a pleasant 73 year old gentleman who suffered left hemispheric infarct on 12/19/12. From occlusion of the terminal left internal carotid artery, followed by complete revascularization of occlusion of the LT ICA terminus., using 7.4 mg of IA TPA and the trevoprovue retrieval device. Infarct felt to be embolic secondary to left ICA occlusion associated with atrial fibrillation.  . Doing well.  PLAN:  I had a long discussion with the patient and wife regarding his risk for recurrent strokes, risk factor modification and answered questions. Continue Xarelto for secondary stroke prevention and strict control of hypertension with blood pressure goal below 130/90. Return for followup in the future in one year or call earlier if necessary  Antony Contras, MD  08/06/2014, 2:06 PM [facility] [address], [place], [place] [address] [phone]  Note: This document was prepared with digital dictation and possible smart phrase technology. Any transcriptional errors that result from this process are unintentional.

## 2014-08-06 NOTE — Patient Instructions (Signed)
I had a long discussion with the patient and wife regarding his risk for recurrent strokes, risk factor modification and answered questions. Continue Xarelto for secondary stroke prevention and strict control of hypertension with blood pressure goal below 130/90. Return for followup in the future in one year or call earlier if necessary

## 2014-08-09 ENCOUNTER — Telehealth: Payer: Self-pay | Admitting: Internal Medicine

## 2014-08-09 NOTE — Telephone Encounter (Signed)
Advised patient that we cannot give him excuse from jury duty secondary to ICD, and to call Dr. Leonie Man office for consideration from neurology b/c of stroke.  He verbalized understanding.

## 2014-08-09 NOTE — Telephone Encounter (Signed)
New Prob   Pt is requesting a note to be excused from [employer] due to device and recent stroke. Please call.

## 2014-09-16 ENCOUNTER — Telehealth: Payer: Self-pay | Admitting: Family Medicine

## 2014-09-16 NOTE — Telephone Encounter (Signed)
Referral in suspended status - #[id number] (pending )  Per pt call wanted a referral  For silverback to  Fabio Pierce MD  Address: [address], [place], [place] [address]  [phone]

## 2014-09-17 ENCOUNTER — Ambulatory Visit (INDEPENDENT_AMBULATORY_CARE_PROVIDER_SITE_OTHER): Payer: Commercial Managed Care - HMO

## 2014-09-17 ENCOUNTER — Other Ambulatory Visit: Payer: Self-pay

## 2014-09-17 ENCOUNTER — Ambulatory Visit: Payer: Commercial Managed Care - HMO | Admitting: Family Medicine

## 2014-09-17 DIAGNOSIS — Z23 Encounter for immunization: Secondary | ICD-10-CM

## 2014-09-17 MED ORDER — RIVAROXABAN 20 MG PO TABS: 20.0000 mg | ORAL_TABLET | Freq: Every day | ORAL | Status: DC

## 2014-09-17 NOTE — Telephone Encounter (Signed)
Pt seen in office today for flu shot and stated he was in the 'donut hole" with his Xarelto 20mg .  Pt given samples in office today.

## 2014-09-23 ENCOUNTER — Encounter: Payer: Self-pay | Admitting: Internal Medicine

## 2014-09-23 ENCOUNTER — Ambulatory Visit (INDEPENDENT_AMBULATORY_CARE_PROVIDER_SITE_OTHER): Payer: Commercial Managed Care - HMO | Admitting: *Deleted

## 2014-09-23 DIAGNOSIS — I472 Ventricular tachycardia: Secondary | ICD-10-CM

## 2014-09-23 DIAGNOSIS — I4729 Other ventricular tachycardia: Secondary | ICD-10-CM

## 2014-09-23 LAB — MDC_IDC_ENUM_SESS_TYPE_REMOTE
Brady Statistic RV Percent Paced: 1.78 %
HIGH POWER IMPEDANCE MEASURED VALUE: 80 Ohm
Lead Channel Setting Pacing Pulse Width: 0.8 ms
Lead Channel Setting Sensing Sensitivity: 0.3 mV
MDC IDC MSMT BATTERY VOLTAGE: 2.99 V
MDC IDC MSMT LEADCHNL RV IMPEDANCE VALUE: 464 Ohm
MDC IDC MSMT LEADCHNL RV SENSING INTR AMPL: 8.8 mV
MDC IDC SESS DTM: 20151019062826
MDC IDC SET LEADCHNL RV PACING AMPLITUDE: 2.5 V
Zone Setting Detection Interval: 280 ms
Zone Setting Detection Interval: 330 ms
Zone Setting Detection Interval: 430 ms

## 2014-09-23 NOTE — Progress Notes (Signed)
Remote ICD transmission.   

## 2014-09-25 LAB — HM DIABETES EYE EXAM

## 2014-10-09 ENCOUNTER — Encounter: Payer: Self-pay | Admitting: Cardiology

## 2014-10-23 ENCOUNTER — Encounter: Payer: Self-pay | Admitting: Neurology

## 2014-10-29 ENCOUNTER — Encounter: Payer: Self-pay | Admitting: Family Medicine

## 2014-10-30 ENCOUNTER — Other Ambulatory Visit: Payer: Self-pay

## 2014-10-30 MED ORDER — CARVEDILOL 6.25 MG PO TABS: ORAL_TABLET | ORAL | Status: DC

## 2014-10-30 NOTE — Telephone Encounter (Signed)
carvedilol (COREG) 6.25 MG tablet TAKE ONE TABLET BY MOUTH TWICE DAILY   Your physician wants you to follow-up in: 1 year with Dr. Caryl Comes. You will receive a reminder letter in the mail two months in advance. If you don't receive a letter, please call our office to schedule the follow-up appointment.  Deboraha Sprang, MD at 03/18/2014 12:58 PM

## 2014-11-14 ENCOUNTER — Encounter: Payer: Self-pay | Admitting: Family Medicine

## 2014-11-15 ENCOUNTER — Ambulatory Visit (INDEPENDENT_AMBULATORY_CARE_PROVIDER_SITE_OTHER): Payer: Commercial Managed Care - HMO | Admitting: Family Medicine

## 2014-11-15 ENCOUNTER — Encounter: Payer: Self-pay | Admitting: Family Medicine

## 2014-11-15 VITALS — BP 98/70 | HR 66 | Temp 98.1°F | Wt 213.0 lb

## 2014-11-15 DIAGNOSIS — L570 Actinic keratosis: Secondary | ICD-10-CM

## 2014-11-15 DIAGNOSIS — I472 Ventricular tachycardia: Secondary | ICD-10-CM

## 2014-11-15 DIAGNOSIS — E785 Hyperlipidemia, unspecified: Secondary | ICD-10-CM

## 2014-11-15 DIAGNOSIS — Z87891 Personal history of nicotine dependence: Secondary | ICD-10-CM | POA: Insufficient documentation

## 2014-11-15 DIAGNOSIS — E119 Type 2 diabetes mellitus without complications: Secondary | ICD-10-CM

## 2014-11-15 DIAGNOSIS — I2510 Atherosclerotic heart disease of native coronary artery without angina pectoris: Secondary | ICD-10-CM

## 2014-11-15 DIAGNOSIS — Z23 Encounter for immunization: Secondary | ICD-10-CM

## 2014-11-15 DIAGNOSIS — I10 Essential (primary) hypertension: Secondary | ICD-10-CM

## 2014-11-15 LAB — COMPREHENSIVE METABOLIC PANEL
ALT: 20 U/L (ref 0–53)
AST: 19 U/L (ref 0–37)
Albumin: 4.1 g/dL (ref 3.5–5.2)
Alkaline Phosphatase: 79 U/L (ref 39–117)
BUN: 15 mg/dL (ref 6–23)
CO2: 29 meq/L (ref 19–32)
Calcium: 9.3 mg/dL (ref 8.4–10.5)
Chloride: 104 mEq/L (ref 96–112)
Creatinine, Ser: 0.9 mg/dL (ref 0.4–1.5)
GFR: 86.15 mL/min (ref >60.00)
Glucose, Bld: 153 mg/dL — ABNORMAL HIGH (ref 70–99)
Potassium: 4.3 mEq/L (ref 3.5–5.1)
Sodium: 138 mEq/L (ref 135–145)
Total Bilirubin: 1.1 mg/dL (ref 0.2–1.2)
Total Protein: 7.2 g/dL (ref 6.0–8.3)

## 2014-11-15 LAB — LIPID PANEL
CHOL/HDL RATIO: 4
Cholesterol: 130 mg/dL (ref 0–200)
HDL: 35.4 mg/dL — ABNORMAL LOW (ref >39.00)
LDL CALC: 83 mg/dL (ref 0–99)
NONHDL: 94.6
Triglycerides: 59 mg/dL (ref 0.0–149.0)
VLDL: 11.8 mg/dL (ref 0.0–40.0)

## 2014-11-15 LAB — CBC
HEMATOCRIT: 47.8 % (ref 39.0–52.0)
Hemoglobin: 15.8 g/dL (ref 13.0–17.0)
MCHC: 32.9 g/dL (ref 30.0–36.0)
MCV: 98.7 fl (ref 78.0–100.0)
Platelets: 158 10*3/uL (ref 150.0–400.0)
RBC: 4.85 Mil/uL (ref 4.22–5.81)
RDW: 13.2 % (ref 11.5–15.5)
WBC: 6.4 10*3/uL (ref 4.0–10.5)

## 2014-11-15 LAB — HEMOGLOBIN A1C: HEMOGLOBIN A1C: 7.3 % — AB (ref 4.6–6.5)

## 2014-11-15 NOTE — Patient Instructions (Addendum)
Updated labs today  Froze off 6 precancers-1 on right forehead, right neck, 2 on right ear, 1 on left ear  Blood pressure looked fine. Diabetes was fine last time.   Check with your insurance about tetanus shot  Prevnar-pneumonia booster  Call a week ahead of time for 4 month visit for [place] to set up an a1c for you  Xarelto samples

## 2014-11-15 NOTE — Assessment & Plan Note (Signed)
cryotherapy completed on 6 lesions mentioned in exam

## 2014-11-15 NOTE — Progress Notes (Signed)
Carl Reddish, MD Phone: [phone]  Subjective:  Patient presents today to establish care with me as their new primary care provider. Patient was formerly a patient of Dr. Leanne Chang. Chief complaint-noted.   Hypertension in setting CAD-good control  BP Readings from Last 3 Encounters:  11/15/14 98/70  08/06/14 120/70  04/02/14 122/70  Home BP monitoring-no Compliant with medications-yes without side effects ROS-lood pressure goes up to around 140 or 150 occasionally, occasional face flushing. No chest pain or shortness of breath. No nitroglycerin use. Swelling in left leg chronic issue ? If that is side of DVT hx.   Hyperlipidemia-good control on atorvastatin 80mg   Lab Results  Component Value Date   LDLCALC 83 11/15/2014  Regular exercise: see dm Diet: see dm ROS- no chest pain or shortness of breath. No myalgias  DIABETES Type II-well controlled with diet and exercise previously Lab Results  Component Value Date   HGBA1C 6.8* 04/02/2014   HGBA1C 6.9* 10/12/2013   HGBA1C 7.1* 05/28/2013  Medications taking and tolerating-no medications Blood Sugars per patient-does not check Diet-poor control portion size Regular Exercise-hard work around house and yard On Aspirin-no, xarelto On statin-yes Daily foot monitoring-yes  ROS- Denies Vision changes, feet or hand numbness/pain/tingling. Denies Hypoglycemia symptoms (shaky, sweaty, hungry, weak anxious, tremor, palpitations, confusion, behavior change).   Patient has some questions about some red spots on his face which are similar to ones that have been treated with cryotherapy before.   The following were reviewed and entered/updated in epic: Past Medical History  Diagnosis Date  . PE (pulmonary embolism)   . Colon polyps   . CHF (congestive heart failure)   . CAD (coronary artery disease)   . Diabetes mellitus   . Diverticulosis of colon   . Hyperlipidemia   . Hypertension   . MI (mitral incompetence)   .  Nephrolithiasis   . V-tach   . NEPHROLITHIASIS 06/26/2008    Qualifier: Diagnosis of  By: Sherlynn Stalls, CMA, [place]    . DIVERTICULOSIS, COLON 10/16/2006    Qualifier: Diagnosis of  By: Leanne Chang MD, [place]     Patient Active Problem List   Diagnosis Date Noted  . Former smoker 11/15/2014    Priority: High  . Atrial fibrillation 12/19/2012    Priority: High  . CVA (cerebral infarction) 12/19/2012    Priority: High  .  ventricular tachycardia-non sustained  02/17/2012    Priority: High  . Cardiomyopathy, ischemic 09/23/2009    Priority: High  . Diabetes mellitus type II, controlled 10/16/2006    Priority: High  . CAD (coronary artery disease) 10/16/2006    Priority: High  . PULMONARY EMBOLISM, HX OF 10/16/2006    Priority: High  . Automatic implantable cardiac defibrillator  medtronic 02/17/2012    Priority: Medium  . History of skin cancer 07/05/2007    Priority: Medium  . Hyperlipidemia 10/16/2006    Priority: Medium  . Essential hypertension 10/16/2006    Priority: Medium  . Trigger finger, acquired 03/14/2014    Priority: Low  . Benign neoplasm of colon 12/11/2012    Priority: Low  . PSEUDOGOUT 08/23/2008    Priority: Low   Past Surgical History  Procedure Laterality Date  . Back surgery    . Cardiac defibrillator placement      medtronic virtuoso  . Basal cell carcinoma excision      nose  . Knee arthroplasty    . Cholecystectomy    . Colonoscopy  12/11/2012    Procedure: COLONOSCOPY;  Surgeon: Herbie Baltimore  Shaaron Adler, MD;  Location: [facility];  Service: Endoscopy;  Laterality: N/A;    Family History  Problem Relation Age of Onset  . Colon cancer Father   . Diabetes Father   . Heart disease Father   . Bladder Cancer Mother   . Heart disease Mother     Medications- reviewed and updated Current Outpatient Prescriptions  Medication Sig Dispense Refill  . atorvastatin (LIPITOR) 80 MG tablet TAKE ONE TABLET BY MOUTH EVERY DAY AS DIRECTED 30 tablet 5  . carvedilol (COREG)  6.25 MG tablet TAKE ONE TABLET BY MOUTH TWICE DAILY 180 tablet 1  . losartan (COZAAR) 50 MG tablet Take 0.5 tablets (25 mg total) by mouth daily. 90 tablet 3  . rivaroxaban (XARELTO) 20 MG TABS tablet Take 1 tablet (20 mg total) by mouth daily with supper. 30 tablet 0  . digoxin (LANOXIN) 0.25 MG tablet Take one-half tablet by mouth daily (Patient not taking: Reported on 11/15/2014) 90 tablet 3  . mometasone (NASONEX) 50 MCG/ACT nasal spray Place 1 spray into the nose daily as needed. (Patient not taking: Reported on 11/15/2014) 17 g 3   No current facility-administered medications for this visit.    Allergies-reviewed and updated Allergies  Allergen Reactions  . Penicillins     REACTION: unspecified  . Sulfamethoxazole     REACTION: unspecified    History   Social History  . Marital Status: Married    Spouse Name: [name]    Number of Children: 3  . Years of Education: 12   Occupational History  . Retired    Social History Main Topics  . Smoking status: Former Smoker -- 0.75 packs/day for 50 years    Types: Cigarettes    Quit date: 12/15/2012  . Smokeless tobacco: Never Used  . Alcohol Use: No  . Drug Use: No  . Sexual Activity: None   Other Topics Concern  . None   Social History Narrative   Married. 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  CV: RRR no murmurs rubs or gallops Lungs: CTAB . No crackles. Occasional wheeze or course sound-often cleared by coughing.  Abdomen: soft/nontender/nondistended/normal bowel sounds.  Ext: no edema Skin: warm, dry, 6 actinic keratosis- 1 on right forehead,  right neck, 2 on right ear, 1 on left ear Neuro: grossly normal, moves all extremities, PERRLA   Assessment/Plan:  Diabetes mellitus type II, controlled Lab Results  Component Value Date   HGBA1C 7.3* 11/15/2014   a1c trending up from 6.8 on last check. Discussed by phone increasing exercise and controlling portion size. See results note. Follow up 3 months, a1c a week before visit.   Hyperlipidemia Reasonable control on atorvastatin 80mg . LDL up to 83 from 66 and ideally less than 70 with commodities. Advised increased exercise and improved portion control which may help.   Essential hypertension Well controlled occasional fluctuations up into 140 range are tolerable. Continue carvedilol low dose and losartan 25mg   Actinic keratosis cryotherapy completed on 6 lesions mentioned in exam  Return precautions advised. 3-4 month follow up.   Orders Placed This  Encounter  Procedures  . Tdap vaccine greater than or equal to 7yo IM  . Pneumococcal conjugate vaccine 13-valent  . Hemoglobin A1c    [place]  . CBC    [place]  . Comprehensive metabolic panel    [place]    Order Specific Question:  Has the patient fasted?    Answer:  No  . Lipid panel    [place]    Order Specific Question:  Has the patient fasted?    Answer:  No    No orders of the defined types were placed in this encounter.

## 2014-11-15 NOTE — Assessment & Plan Note (Signed)
Well controlled occasional fluctuations up into 140 range are tolerable. Continue carvedilol low dose and losartan 25mg 

## 2014-11-15 NOTE — Assessment & Plan Note (Signed)
Lab Results  Component Value Date   HGBA1C 7.3* 11/15/2014   a1c trending up from 6.8 on last check. Discussed by phone increasing exercise and controlling portion size. See results note. Follow up 3 months, a1c a week before visit.

## 2014-11-15 NOTE — Assessment & Plan Note (Addendum)
Reasonable control on atorvastatin 80mg . LDL up to 83 from 66 and ideally less than 70 with commodities. Advised increased exercise and improved portion control which may help.

## 2014-11-30 ENCOUNTER — Other Ambulatory Visit: Payer: Self-pay | Admitting: Internal Medicine

## 2014-12-09 ENCOUNTER — Encounter: Payer: Self-pay | Admitting: Internal Medicine

## 2014-12-11 ENCOUNTER — Telehealth: Payer: Self-pay

## 2014-12-11 NOTE — Telephone Encounter (Signed)
Pt called stating he needs a neck doppler scheduled for April due to a stroke that he had back in 2013. Are you familiar with this?

## 2014-12-11 NOTE — Telephone Encounter (Signed)
Had carotid duplex 03/18/14 with recommended 1 year follow up. I would not do this until April at the earliest. His #s are so low honestly could consider longer interval. I don't see another test outside of this that he needs.

## 2014-12-25 ENCOUNTER — Telehealth: Payer: Self-pay | Admitting: Family Medicine

## 2014-12-25 ENCOUNTER — Telehealth: Payer: Self-pay

## 2014-12-25 NOTE — Telephone Encounter (Signed)
Yes may enter all referrals.

## 2014-12-25 NOTE — Telephone Encounter (Signed)
Pt came in stating that he needs a referral to Dr. Adam Phenix for his appt tomorrow 12/26/14 to have his heart monitor checked which he has done every three months and Dr. Arnoldo Morale would always put in  A referral for him each time. He also states he needs a referral for Dr. Leonie Man at [facility] to f/u on his stroke and carotid doppler. Pt is aware that the referral for the carotid doppler wont be entered until April of this year. Are the other two referrals to Dr. Caryl Comes and Dr.Sethi ok to enter?

## 2014-12-25 NOTE — Telephone Encounter (Signed)
Carney Bern at 12/25/2014 11:53 AM    Pt scheduled For 08-07-2015 @9 :68 -Antony Contras, MD Outpatient Authorization [phone] 08/27/2015 - 02/23/2016 To see Dr Kathleen Argue [facility] [address] [place], [place] [address]  Tel: [phone] Fax: [phone]  Also called [facility] spoke with sharon fergon To see if authorization is need for for his remote defib check per sharon their office has already submitted a referral on file for this pt its still valid until April 16.2016 he as a total of 5 more visits

## 2014-12-25 NOTE — Telephone Encounter (Signed)
Pt scheduled  For 08-07-2015 @9 :5 -Antony Contras, MD Outpatient Authorization [id number]  08/27/2015 - 02/23/2016 To see Dr Kathleen Argue [facility] [place], [place] [address]  Tel: [phone] Fax: [phone]  Also called [facility] spoke with sharon fergon  To see if authorization is need for for his remote defib check per sharon their office has already submitted a referral on file for this pt its still valid until April 16.2016 he as a total of 5 more visits

## 2014-12-25 NOTE — Telephone Encounter (Signed)
Is pt aware Ms. Neoma Laming?

## 2014-12-26 ENCOUNTER — Other Ambulatory Visit: Payer: Self-pay | Admitting: Internal Medicine

## 2014-12-26 ENCOUNTER — Encounter: Payer: Self-pay | Admitting: Internal Medicine

## 2014-12-26 ENCOUNTER — Ambulatory Visit (INDEPENDENT_AMBULATORY_CARE_PROVIDER_SITE_OTHER): Payer: Commercial Managed Care - HMO | Admitting: *Deleted

## 2014-12-26 DIAGNOSIS — I472 Ventricular tachycardia: Secondary | ICD-10-CM

## 2014-12-26 DIAGNOSIS — I4729 Other ventricular tachycardia: Secondary | ICD-10-CM

## 2014-12-26 NOTE — Progress Notes (Signed)
Remote ICD transmission.   

## 2014-12-28 LAB — MDC_IDC_ENUM_SESS_TYPE_REMOTE
Battery Voltage: 2.95 V
Brady Statistic RV Percent Paced: 1.76 %
Date Time Interrogation Session: 20160121132607
HighPow Impedance: 82 Ohm
Lead Channel Sensing Intrinsic Amplitude: 11.511 mV
Lead Channel Setting Pacing Pulse Width: 0.8 ms
Lead Channel Setting Sensing Sensitivity: 0.3 mV
MDC IDC MSMT LEADCHNL RV IMPEDANCE VALUE: 496 Ohm
MDC IDC SET LEADCHNL RV PACING AMPLITUDE: 2.5 V
MDC IDC SET ZONE DETECTION INTERVAL: 280 ms
Zone Setting Detection Interval: 330 ms
Zone Setting Detection Interval: 430 ms

## 2015-03-07 ENCOUNTER — Telehealth: Payer: Self-pay

## 2015-03-07 ENCOUNTER — Telehealth: Payer: Self-pay | Admitting: Family Medicine

## 2015-03-07 DIAGNOSIS — E119 Type 2 diabetes mellitus without complications: Secondary | ICD-10-CM

## 2015-03-07 NOTE — Telephone Encounter (Signed)
L/m with pt wife

## 2015-03-07 NOTE — Telephone Encounter (Signed)
Pt has an appt on 4-11 and per pt was told to callback to sch labs in advance. What labs can I sch?

## 2015-03-07 NOTE — Telephone Encounter (Signed)
A1c has been entered, ok to schedule lab visit.

## 2015-03-07 NOTE — Telephone Encounter (Signed)
Pt will wait to have a1c at visit

## 2015-03-07 NOTE — Telephone Encounter (Signed)
Lab order entered.

## 2015-03-17 ENCOUNTER — Telehealth: Payer: Self-pay | Admitting: Internal Medicine

## 2015-03-17 ENCOUNTER — Encounter: Payer: Self-pay | Admitting: Family Medicine

## 2015-03-17 ENCOUNTER — Ambulatory Visit (INDEPENDENT_AMBULATORY_CARE_PROVIDER_SITE_OTHER): Payer: Commercial Managed Care - HMO | Admitting: Family Medicine

## 2015-03-17 VITALS — BP 110/64 | HR 67 | Temp 98.4°F | Wt 212.0 lb

## 2015-03-17 DIAGNOSIS — E785 Hyperlipidemia, unspecified: Secondary | ICD-10-CM | POA: Diagnosis not present

## 2015-03-17 DIAGNOSIS — E119 Type 2 diabetes mellitus without complications: Secondary | ICD-10-CM

## 2015-03-17 DIAGNOSIS — I6523 Occlusion and stenosis of bilateral carotid arteries: Secondary | ICD-10-CM | POA: Diagnosis not present

## 2015-03-17 DIAGNOSIS — I10 Essential (primary) hypertension: Secondary | ICD-10-CM

## 2015-03-17 DIAGNOSIS — I6529 Occlusion and stenosis of unspecified carotid artery: Secondary | ICD-10-CM | POA: Insufficient documentation

## 2015-03-17 LAB — HEMOGLOBIN A1C: HEMOGLOBIN A1C: 7.1 % — AB (ref 4.6–6.5)

## 2015-03-17 LAB — LDL CHOLESTEROL, DIRECT: Direct LDL: 71 mg/dL

## 2015-03-17 NOTE — Progress Notes (Signed)
[name], MD Phone: [phone]  Subjective:   Carl Wood is a 74 y.o. year old very pleasant male patient who presents with the following:  Diabetes Mellitus- mild poor control last visit Needs a1c 7.3 from 6.8 on no meds. Weight down 1 lb from working on portion control and trying to be more active but this is limited due to cardiomyopathy  Hyperlipidemia-slightly above LDL goal 70 on atorvastatin 80mg   Lab Results  Component Value Date   LDLCALC 83 11/15/2014   Hypertension-controlled  BP Readings from Last 3 Encounters:  03/17/15 110/64  11/15/14 98/70  08/06/14 120/70  Compliant with medications-yes without side effects  ROS- shortness of breath with activity including walking that improves as he walks. no chest pain. No myalgias. Occasional LE edema on left ankle (on xarelto and DVT in alternate leg but none today-not sure if he has had a prior injury).   Past Medical History- Patient Active Problem List   Diagnosis Date Noted  . Former smoker 11/15/2014    Priority: High  . Atrial fibrillation 12/19/2012    Priority: High  . CVA (cerebral infarction) 12/19/2012    Priority: High  .  ventricular tachycardia-non sustained  02/17/2012    Priority: High  . Cardiomyopathy, ischemic 09/23/2009    Priority: High  . Diabetes mellitus type II, controlled 10/16/2006    Priority: High  . CAD (coronary artery disease) 10/16/2006    Priority: High  . PULMONARY EMBOLISM, HX OF 10/16/2006    Priority: High  . Automatic implantable cardiac defibrillator  medtronic 02/17/2012    Priority: Medium  . History of skin cancer 07/05/2007    Priority: Medium  . Hyperlipidemia 10/16/2006    Priority: Medium  . Essential hypertension 10/16/2006    Priority: Medium  . Actinic keratosis 11/15/2014    Priority: Low  . Trigger finger, acquired 03/14/2014    Priority: Low  . Benign neoplasm of colon 12/11/2012    Priority: Low  . PSEUDOGOUT 08/23/2008    Priority:  Low  . Carotid artery stenosis 03/17/2015   Medications- reviewed and updated Current Outpatient Prescriptions  Medication Sig Dispense Refill  . atorvastatin (LIPITOR) 80 MG tablet TAKE ONE TABLET BY MOUTH ONCE DAILY AS DIRECTED 30 tablet 3  . carvedilol (COREG) 6.25 MG tablet TAKE ONE TABLET BY MOUTH TWICE DAILY 180 tablet 1  . digoxin (LANOXIN) 0.25 MG tablet Take one-half tablet by mouth daily 90 tablet 3  . losartan (COZAAR) 50 MG tablet Take 0.5 tablets (25 mg total) by mouth daily. 90 tablet 3  . rivaroxaban (XARELTO) 20 MG TABS tablet Take 1 tablet (20 mg total) by mouth daily with supper. 30 tablet 0  . mometasone (NASONEX) 50 MCG/ACT nasal spray Place 1 spray into the nose daily as needed. (Patient not taking: Reported on 11/15/2014) 17 g 3   No current facility-administered medications for this visit.    Objective: BP 110/64 mmHg  Pulse 67  Temp(Src) 98.4 F (36.9 C)  Wt 212 lb (96.163 kg) Gen: NAD, resting comfortably CV: RRR no murmurs rubs or gallops Lungs: CTAB no crackles, wheeze, rhonchi Abdomen: soft/nontender/nondistended/normal bowel sounds.  Ext: no edema Skin: warm, dry, no rash   Assessment/Plan:  Diabetes mellitus type II, controlled mild poor control last visit with a1c 7.3 up from 6.8 without medication. Check a1c today, consider metformin if between 7-7.5, hold off on rx if <7. Patient down 1 lb and we primarily were going to work on more activity smaller portions  Hyperlipidemia Hopeful LDL <70 as goal with CAD and CVA history. Patient has tried to work on diet/activity level. Check today.    Essential hypertension Controlled on Carvedilol 6.25 mg BID, losartan 25mg . No change.    Carotid artery stenosis S: was told needed carotid duplex follow up. 03/2014- 1-39% stenosis with bilateral plaque A/P: With history CVA, order repeat today per Dr. Clydene Fake prior instructions. Continue to maximize medical therapy of diabetes, HLD, HTN.     Return  precautions advised. Consider AAA and CT screen at follow up for smoking history   Orders Placed This Encounter  Procedures  . Hemoglobin A1c    [place]  . LDL cholesterol, direct      . Carotid duplex    Standing Status: Future     Number of Occurrences:      Standing Expiration Date: 03/16/2016    Order Specific Question:  Laterality    Answer:  Bilateral    Order Specific Question:  Where should this test be performed:    Answer:  [facility]

## 2015-03-17 NOTE — Telephone Encounter (Signed)
Attempted to trouble shoot home monitor w/ no success. Pt agreed to appt tomorrow (03-18-15) at 2:00 w/ device clinic

## 2015-03-17 NOTE — Assessment & Plan Note (Signed)
S: was told needed carotid duplex follow up. 03/2014- 1-39% stenosis with bilateral plaque A/P: With history CVA, order repeat today per Dr. Clydene Fake prior instructions. Continue to maximize medical therapy of diabetes, HLD, HTN.

## 2015-03-17 NOTE — Assessment & Plan Note (Signed)
Controlled on Carvedilol 6.25 mg BID, losartan 25mg . No change.

## 2015-03-17 NOTE — Assessment & Plan Note (Addendum)
mild poor control last visit with a1c 7.3 up from 6.8 without medication. Check a1c today, consider metformin if between 7-7.5, hold off on rx if <7. Patient down 1 lb and we primarily were going to work on more activity smaller portions

## 2015-03-17 NOTE — Assessment & Plan Note (Signed)
Hopeful LDL <70 as goal with CAD and CVA history. Patient has tried to work on diet/activity level. Check today.

## 2015-03-17 NOTE — Patient Instructions (Addendum)
See Carl Wood before you leave to get dates set up for carotid artery duplex and Dr. Caryl Comes visit-try to get set up same day per your preference  Weight down 1 pound, check a1c and lipids today-goal bad cholesterol <70  Let's follow up 4 months from now as long as a1c <7.5. May start you on low dose diabetes medicie if between 7 and 7.5.

## 2015-03-17 NOTE — Telephone Encounter (Signed)
New Message  Pt wanted to send remote transmission since he wont be seeing Dr. Caryl Comes until June 29. Please call back and discuss.

## 2015-03-18 ENCOUNTER — Ambulatory Visit (HOSPITAL_COMMUNITY): Payer: Commercial Managed Care - HMO | Attending: Cardiology | Admitting: Cardiology

## 2015-03-18 ENCOUNTER — Ambulatory Visit (INDEPENDENT_AMBULATORY_CARE_PROVIDER_SITE_OTHER): Payer: Commercial Managed Care - HMO | Admitting: *Deleted

## 2015-03-18 ENCOUNTER — Encounter: Payer: Self-pay | Admitting: Internal Medicine

## 2015-03-18 DIAGNOSIS — I472 Ventricular tachycardia: Secondary | ICD-10-CM | POA: Diagnosis not present

## 2015-03-18 DIAGNOSIS — I6523 Occlusion and stenosis of bilateral carotid arteries: Secondary | ICD-10-CM | POA: Diagnosis not present

## 2015-03-18 DIAGNOSIS — I4729 Other ventricular tachycardia: Secondary | ICD-10-CM

## 2015-03-18 DIAGNOSIS — I255 Ischemic cardiomyopathy: Secondary | ICD-10-CM

## 2015-03-18 LAB — MDC_IDC_ENUM_SESS_TYPE_INCLINIC
HIGH POWER IMPEDANCE MEASURED VALUE: 82 Ohm
Lead Channel Impedance Value: 488 Ohm
Lead Channel Pacing Threshold Amplitude: 1 V
Lead Channel Pacing Threshold Pulse Width: 0.8 ms
Lead Channel Sensing Intrinsic Amplitude: 11.1725
Lead Channel Setting Pacing Amplitude: 2.5 V
Lead Channel Setting Pacing Pulse Width: 0.8 ms
Lead Channel Setting Sensing Sensitivity: 0.3 mV
MDC IDC MSMT BATTERY VOLTAGE: 2.93 V
MDC IDC SESS DTM: 20160412160235
MDC IDC SET ZONE DETECTION INTERVAL: 330 ms
MDC IDC SET ZONE DETECTION INTERVAL: 430 ms
MDC IDC STAT BRADY RV PERCENT PACED: 1.8 %
Zone Setting Detection Interval: 280 ms

## 2015-03-18 NOTE — Progress Notes (Signed)
Carotid duplex performed 

## 2015-03-18 NOTE — Progress Notes (Signed)
ICD check in clinic. Normal device function. Threshold and sensing consistent with previous device measurements. Impedance trends stable over time. (73) total "ventricular arrhythmias"---max dur. 6 sec, Max Avg V 200. Histogram distribution appropriate for patient and level of activity. Unstable Optivol fluid measurement since 4/3---pt admits to occas edema of LLE as well as DOE. Patient couciled about the importance of restricting sodium from diet. No changes made this session. Device programmed at appropriate safety margins. Device programmed to optimize intrinsic conduction. Batt voltage 2.93V (ERI 2.62V). Pt enrolled in remote follow-up. Plan to follow up with SK on 6/29 @ 0930. Patient education completed including shock plan. Alert tones demonstrated for patient.

## 2015-04-02 ENCOUNTER — Other Ambulatory Visit: Payer: Self-pay

## 2015-04-02 MED ORDER — DIGOXIN 250 MCG PO TABS: ORAL_TABLET | ORAL | Status: DC

## 2015-04-28 ENCOUNTER — Other Ambulatory Visit: Payer: Self-pay | Admitting: Internal Medicine

## 2015-04-29 NOTE — Telephone Encounter (Signed)
Per note 4.13.15

## 2015-06-04 ENCOUNTER — Encounter: Payer: Self-pay | Admitting: Internal Medicine

## 2015-06-04 ENCOUNTER — Ambulatory Visit (INDEPENDENT_AMBULATORY_CARE_PROVIDER_SITE_OTHER): Payer: Commercial Managed Care - HMO | Admitting: Internal Medicine

## 2015-06-04 VITALS — BP 120/70 | HR 72 | Ht 72.0 in | Wt 210.0 lb

## 2015-06-04 DIAGNOSIS — I509 Heart failure, unspecified: Secondary | ICD-10-CM | POA: Diagnosis not present

## 2015-06-04 DIAGNOSIS — I255 Ischemic cardiomyopathy: Secondary | ICD-10-CM | POA: Diagnosis not present

## 2015-06-04 DIAGNOSIS — I4729 Other ventricular tachycardia: Secondary | ICD-10-CM

## 2015-06-04 DIAGNOSIS — Z79899 Other long term (current) drug therapy: Secondary | ICD-10-CM

## 2015-06-04 DIAGNOSIS — I472 Ventricular tachycardia: Secondary | ICD-10-CM | POA: Diagnosis not present

## 2015-06-04 DIAGNOSIS — I4821 Permanent atrial fibrillation: Secondary | ICD-10-CM

## 2015-06-04 DIAGNOSIS — Z4502 Encounter for adjustment and management of automatic implantable cardiac defibrillator: Secondary | ICD-10-CM

## 2015-06-04 DIAGNOSIS — I482 Chronic atrial fibrillation: Secondary | ICD-10-CM | POA: Diagnosis not present

## 2015-06-04 LAB — CUP PACEART INCLINIC DEVICE CHECK
Battery Voltage: 2.88 V
Brady Statistic RV Percent Paced: 2.05 %
Date Time Interrogation Session: 20160629144758
HighPow Impedance: 78 Ohm
Lead Channel Impedance Value: 496 Ohm
Lead Channel Pacing Threshold Pulse Width: 0.8 ms
Lead Channel Sensing Intrinsic Amplitude: 9.8182
Lead Channel Setting Pacing Amplitude: 2.5 V
MDC IDC MSMT LEADCHNL RV PACING THRESHOLD AMPLITUDE: 1 V
MDC IDC SET LEADCHNL RV PACING PULSEWIDTH: 0.8 ms
MDC IDC SET LEADCHNL RV SENSING SENSITIVITY: 0.3 mV
MDC IDC SET ZONE DETECTION INTERVAL: 280 ms
MDC IDC SET ZONE DETECTION INTERVAL: 330 ms
Zone Setting Detection Interval: 430 ms

## 2015-06-04 NOTE — Progress Notes (Signed)
Patient Care Team: Marin Olp, MD as PCP - General (Family Medicine)   HPI  Carl Wood is a 74 y.o. male  is seen in followup for ischemic heart disease with ICD implanted for ventricular tachycardia. He has history of bypass grafting and depressed left ventricular function. He has a history of recurrent ventricular tachycardia--most recently polymorphic in september 2101 associated with low-normal magnesium.  He underwent a Myoview scanning demonstrated ejection fraction of 27% without ischemia but with a large LAD infarct. He has permanent atrial fibrillation.   Echocardiogram 1/14 EF 38-35%  The patient denies SOB, Exertional chest pain, edema or palpitations  He had a stroke in Jan 2014 from which he has making a recovery  Past Medical History  Diagnosis Date  . PE (pulmonary embolism)   . Colon polyps   . CHF (congestive heart failure)   . CAD (coronary artery disease)   . Diabetes mellitus   . Diverticulosis of colon   . Hyperlipidemia   . Hypertension   . MI (mitral incompetence)   . Nephrolithiasis   . V-tach   . NEPHROLITHIASIS 06/26/2008    Qualifier: Diagnosis of  By: Sherlynn Stalls, CMA, [place]    . DIVERTICULOSIS, COLON 10/16/2006    Qualifier: Diagnosis of  By: Leanne Chang MD, [place]      Past Surgical History  Procedure Laterality Date  . Back surgery    . Cardiac defibrillator placement      medtronic virtuoso  . Basal cell carcinoma excision      nose  . Knee arthroplasty    . Cholecystectomy    . Colonoscopy  12/11/2012    Procedure: COLONOSCOPY;  Surgeon: Inda Castle, MD;  Location: [facility];  Service: Endoscopy;  Laterality: N/A;    Current Outpatient Prescriptions  Medication Sig Dispense Refill  . atorvastatin (LIPITOR) 80 MG tablet TAKE ONE TABLET BY MOUTH ONCE DAILY AS DIRECTED 30 tablet 3  . carvedilol (COREG) 6.25 MG tablet TAKE ONE TABLET BY MOUTH TWICE DAILY 180 tablet 0  . digoxin (LANOXIN) 0.25 MG tablet Take one-half  tablet by mouth daily 90 tablet 0  . losartan (COZAAR) 50 MG tablet Take 0.5 tablets (25 mg total) by mouth daily. 90 tablet 3  . rivaroxaban (XARELTO) 20 MG TABS tablet Take 1 tablet (20 mg total) by mouth daily with supper. 30 tablet 0   No current facility-administered medications for this visit.    Allergies  Allergen Reactions  . Penicillins     REACTION: unspecified  . Sulfamethoxazole     REACTION: unspecified    Review of Systems negative except from HPI and PMH  Physical Exam BP 120/70 mmHg  Pulse 72  Ht 6' (1.829 m)  Wt 210 lb (95.255 kg)  BMI 28.47 kg/m2 Well developed and well nourished in no acute distress HENT normal E scleral and icterus clear Neck Supple JVP flat; carotids brisk and full Clear to ausculation Irregular rate and rhythm, no murmurs gallops or rub Soft with active bowel sounds No clubbing cyanosis  Edema Alert and oriented, grossly normal motor and sensory function Skin Warm and Dry  ECG demonstrates atrial fibrillation at 72 Intervals-/15/46 IVCD with a QS in lead 1 and L as well as a biphasic QRS in lead V6  Old tracings  Assessment and  Plan  Atrial fibrillation-permanent  Fatigue daytime somnolence and snoring  IVCD  Ischemic cardiomyopathy prior bypass surgery  ICD-Medtronic The patient's device was interrogated.  The information  was reviewed. No changes were made in the programming.    We will pursue a sleep study for fatigue and nsnoring  We will repeat an echocardiogram.  We have discussed the potential role of Entresto; however, cost is an issue.  For now we will continue him on Rivaroxaban   He does not have left bundle branch block by strict criteria, in the absence of a monophasic R wave in lead V6; hence, I'm reluctant to recommend CRT at this juncture. This may become something to consider later  We spent more than 50% of our >45 min visit in face to face counseling regarding the above

## 2015-06-04 NOTE — Patient Instructions (Addendum)
Medication Instructions:  Your physician recommends that you continue on your current medications as directed. Please refer to the Current Medication list given to you today.  Labwork: Digoxin level today  Testing/Procedures: Your physician has requested that you have an echocardiogram. Echocardiography is a painless test that uses sound waves to create images of your heart. It provides your doctor with information about the size and shape of your heart and how well your heart's chambers and valves are working. This procedure takes approximately one hour. There are no restrictions for this procedure.  Your physician has recommended that you have a home sleep study. This test records several body functions during sleep, including: brain activity, eye movement, oxygen and carbon dioxide blood levels, heart rate and rhythm, breathing rate and rhythm, the flow of air through your mouth and nose, snoring, body muscle movements, and chest and belly movement.  Carl Wood will call you to arrange this testing.  Follow-Up: Remote monitoring is used to monitor your Pacemaker of ICD from home. This monitoring reduces the number of office visits required to check your device to one time per year. It allows [place] to keep an eye on the functioning of your device to ensure it is working properly. You are scheduled for a device check from home on 09/03/15. You may send your transmission at any time that day. If you have a wireless device, the transmission will be sent automatically. After your physician reviews your transmission, you will receive a postcard with your next transmission date.  Your physician wants you to follow-up in: 1 year with Dr. Caryl Comes.  You will receive a reminder letter in the mail two months in advance. If you don't receive a letter, please call our office to schedule the follow-up appointment.  Thank you for choosing [facility]!!

## 2015-06-11 ENCOUNTER — Other Ambulatory Visit: Payer: Self-pay

## 2015-06-11 ENCOUNTER — Ambulatory Visit (HOSPITAL_COMMUNITY): Payer: Commercial Managed Care - HMO | Attending: Cardiovascular Disease

## 2015-06-11 DIAGNOSIS — I509 Heart failure, unspecified: Secondary | ICD-10-CM | POA: Diagnosis not present

## 2015-06-11 DIAGNOSIS — I517 Cardiomegaly: Secondary | ICD-10-CM | POA: Diagnosis not present

## 2015-06-11 DIAGNOSIS — I340 Nonrheumatic mitral (valve) insufficiency: Secondary | ICD-10-CM | POA: Insufficient documentation

## 2015-06-11 MED ORDER — PERFLUTREN LIPID MICROSPHERE
3.0000 mL | Freq: Once | INTRAVENOUS | Status: AC
  Administered 2015-06-11: 3 mL via INTRAVENOUS

## 2015-06-13 ENCOUNTER — Encounter: Payer: Self-pay | Admitting: Cardiology

## 2015-06-17 ENCOUNTER — Other Ambulatory Visit: Payer: Self-pay | Admitting: Internal Medicine

## 2015-06-18 DIAGNOSIS — G4733 Obstructive sleep apnea (adult) (pediatric): Secondary | ICD-10-CM | POA: Diagnosis not present

## 2015-07-14 ENCOUNTER — Encounter: Payer: Self-pay | Admitting: Internal Medicine

## 2015-07-16 NOTE — Telephone Encounter (Signed)
Called patient and reviewed echo results with the patient. Patient verbalized understanding.

## 2015-07-21 ENCOUNTER — Ambulatory Visit (INDEPENDENT_AMBULATORY_CARE_PROVIDER_SITE_OTHER): Payer: Commercial Managed Care - HMO | Admitting: Family Medicine

## 2015-07-21 ENCOUNTER — Encounter: Payer: Self-pay | Admitting: Family Medicine

## 2015-07-21 VITALS — BP 122/72 | HR 62 | Temp 97.8°F | Wt 211.0 lb

## 2015-07-21 DIAGNOSIS — B351 Tinea unguium: Secondary | ICD-10-CM | POA: Diagnosis not present

## 2015-07-21 DIAGNOSIS — I255 Ischemic cardiomyopathy: Secondary | ICD-10-CM

## 2015-07-21 DIAGNOSIS — E875 Hyperkalemia: Secondary | ICD-10-CM

## 2015-07-21 DIAGNOSIS — E119 Type 2 diabetes mellitus without complications: Secondary | ICD-10-CM

## 2015-07-21 DIAGNOSIS — E785 Hyperlipidemia, unspecified: Secondary | ICD-10-CM | POA: Diagnosis not present

## 2015-07-21 LAB — COMPREHENSIVE METABOLIC PANEL
ALT: 18 U/L (ref 0–53)
AST: 19 U/L (ref 0–37)
Albumin: 4.3 g/dL (ref 3.5–5.2)
Alkaline Phosphatase: 86 U/L (ref 39–117)
BUN: 15 mg/dL (ref 6–23)
CO2: 33 meq/L — AB (ref 19–32)
Calcium: 9.8 mg/dL (ref 8.4–10.5)
Chloride: 105 mEq/L (ref 96–112)
Creatinine, Ser: 0.91 mg/dL (ref 0.40–1.50)
GFR: 87.07 mL/min (ref >60.00)
GLUCOSE: 163 mg/dL — AB (ref 70–99)
POTASSIUM: 5.3 meq/L — AB (ref 3.5–5.1)
SODIUM: 143 meq/L (ref 135–145)
Total Bilirubin: 0.9 mg/dL (ref 0.2–1.2)
Total Protein: 7.7 g/dL (ref 6.0–8.3)

## 2015-07-21 LAB — CBC
HEMATOCRIT: 47.1 % (ref 39.0–52.0)
HEMOGLOBIN: 15.8 g/dL (ref 13.0–17.0)
MCHC: 33.5 g/dL (ref 30.0–36.0)
MCV: 98.6 fl (ref 78.0–100.0)
Platelets: 172 10*3/uL (ref 150.0–400.0)
RBC: 4.77 Mil/uL (ref 4.22–5.81)
RDW: 14.1 % (ref 11.5–15.5)
WBC: 6.1 10*3/uL (ref 4.0–10.5)

## 2015-07-21 LAB — TSH: TSH: 2.27 u[IU]/mL (ref 0.35–4.50)

## 2015-07-21 LAB — HEMOGLOBIN A1C: Hgb A1c MFr Bld: 7.4 % — ABNORMAL HIGH (ref 4.6–6.5)

## 2015-07-21 NOTE — Progress Notes (Signed)
[name], MD  Subjective:  Carl Wood is a 74 y.o. year old very pleasant male patient who presents with:  See problem oriented charting ROS- shortness of breath above just walking at normal pace, no chest pain, no myalgia,s no abnormal fatigue, no easy bruising, no leg swelling  Past Medical History- ischemic cardiomyopathy with EF 20%, history CVA, DM reasonable control, history of v tach on pacemaker-defibrillator  Medications- reviewed and updated Current Outpatient Prescriptions  Medication Sig Dispense Refill  . atorvastatin (LIPITOR) 80 MG tablet TAKE ONE TABLET BY MOUTH ONCE DAILY AS DIRECTED 30 tablet 3  . carvedilol (COREG) 6.25 MG tablet TAKE ONE TABLET BY MOUTH TWICE DAILY 180 tablet 0  . digoxin (LANOXIN) 0.25 MG tablet Take one-half tablet by mouth daily 90 tablet 0  . losartan (COZAAR) 50 MG tablet Take 0.5 tablets (25 mg total) by mouth daily. 90 tablet 3  . XARELTO 20 MG TABS tablet TAKE ONE TABLET BY MOUTH ONCE DAILY 30 tablet 1   Objective: BP 122/72 mmHg  Pulse 62  Temp(Src) 97.8 F (36.6 C)  Wt 211 lb (95.709 kg) Gen: NAD, resting comfortably CV: RRR no murmurs rubs or gallops Lungs: CTAB no crackles, wheeze, rhonchi Abdomen: soft/nontender/nondistended/normal bowel sounds. No rebound or guarding.  Ext: trace edema Skin: warm, dry, no rash Bilateral big toes with thickening and yellowing, without surrounding erythema at skin level Neuro: grossly normal, moves all extremities   Assessment/Plan:  Diabetes mellitus type II, controlled S: given co morbidities, reasonably controlled at a1c 7.4, up from 7.1 last visit A/P: discussed if a1c gets above 7.5, would start medication. Hopeful through heart failure clinic patient can find some exercise that is safe for him- he is active around house and yard but no regular exercise and this may help lower a1c.    Cardiomyopathy, ischemic S: Review of 05/2015 echo shows EF 20%. Diffuse hypokinesis.  Severely dilated left ventricle. Worsened from 30-25% in 2014. Can walk without shortness of breath and do yardwork and housework but above that such as stairs get SOB A/P: Stage II NYHA. already on ace-i and beta blocker and digoxin. With recent decrease in EF, want to have patient plugged in with HF clinic, Dr. Caryl Comes is ok with this so have referred.   Onychomycosis S: present for months. States has had ingrown toenail before and had removed. No recent infectoins A/P: probably would be best for this to be removed in diabetic. On xarelto, nail removal not ideal. Was concerned about medication interactions with lamisil at time of visit but review afterwards shows no digoxin issues. Also thought potential cardiac issues with med, but review on up to date does not show significant risk. Patient is going to talk to podiatry upon referral today about options, but oral more reasonable than I initially believed   Orders Placed This Encounter  Procedures  . CBC    [place]  . Comprehensive metabolic panel    [place]  . Hemoglobin A1c    [place]  . TSH    [place]  . Basic Metabolic Panel    Standing Status: Future     Number of Occurrences:      Standing Expiration Date: 07/21/2016  . AMB referral to CHF clinic    Referral Priority:  Routine    Referral Type:  Consultation    Number of Visits Requested:  1  . Ambulatory referral to Podiatry    Referral Priority:  Routine    Referral Type:  Consultation  Referral Reason:  Specialty Services Required    Requested Specialty:  Podiatry    Number of Visits Requested:  1  Notes to patient about labs:  Potassium hair high- we are going to repeat to see if this is real Sugar levels up some with a1c up to 7.4. Under 7.5, I am not going to start medicine. Thyroid normal Blood counts normal

## 2015-07-21 NOTE — Patient Instructions (Signed)
Medication Instructions:  Same, see list  Other Instructions:  We will call you within a week about your referral to heart failure clinic (I have reached out to Dr. Caryl Comes to make sure this is ok). If you do not hear within 2 weeks, give [place] a call.   Labwork: a1c before you leave- a few other updates as well  Testing/Procedures/Immunizations: Flu shot in October or so- flu clinic here or let us know if you get it elsewhere  Follow-Up: 4 months

## 2015-07-22 NOTE — Assessment & Plan Note (Signed)
S: given co morbidities, reasonably controlled at a1c 7.4, up from 7.1 last visit A/P: discussed if a1c gets above 7.5, would start medication. Hopeful through heart failure clinic patient can find some exercise that is safe for him- he is active around house and yard but no regular exercise and this may help lower a1c.

## 2015-07-22 NOTE — Assessment & Plan Note (Signed)
S: Review of 05/2015 echo shows EF 20%. Diffuse hypokinesis. Severely dilated left ventricle. Worsened from 30-25% in 2014. Can walk without shortness of breath and do yardwork and housework but above that such as stairs get SOB A/P: Stage II NYHA. already on ace-i and beta blocker and digoxin. With recent decrease in EF, want to have patient plugged in with HF clinic, Dr. Caryl Comes is ok with this so have referred.

## 2015-07-23 ENCOUNTER — Telehealth: Payer: Self-pay | Admitting: Internal Medicine

## 2015-07-23 ENCOUNTER — Telehealth: Payer: Self-pay | Admitting: Family Medicine

## 2015-07-23 ENCOUNTER — Telehealth (HOSPITAL_COMMUNITY): Payer: Self-pay | Admitting: Vascular Surgery

## 2015-07-23 NOTE — Telephone Encounter (Signed)
I spoke with Carl Wood who took the pt's phone call and she said the referral placed by Dr Yong Channel was for an appointment with Dr Caryl Comes not the CHF clinic. I made Carl Wood aware that the pt does not need an appointment with Dr Caryl Comes but does need to be scheduled with the CHF clinic.  Carl Wood will contact the CHF clinic for appointment.

## 2015-07-23 NOTE — Telephone Encounter (Signed)
I placed ambulatory referral to heart failure clinic. He needs to be seen at the heart failure clinic- can you call cards and see how we place a referral for this group?  From website: [facility] at [facility] [phone] [address]  [place], [place] [address]

## 2015-07-23 NOTE — Telephone Encounter (Signed)
Dr. Yong Channel where is it that pt needs to be referred since this was not the correct referral per pt?

## 2015-07-23 NOTE — Telephone Encounter (Signed)
Pt states he was to have referral to heart failure clinic . Dr Olin Pia office called b/c a referral was sent to them, but pt states that is not where he needs to be referred. Pt wants make sure the correct referral has been done. thanks

## 2015-07-23 NOTE — Telephone Encounter (Signed)
Left pt a voicemail message to give [place] a call back to make new pt appt

## 2015-07-23 NOTE — Telephone Encounter (Signed)
New Message  Pt called states that Dr. Yong Channel was sending a note over requesting to have the pt seen in the CHF clinic. Called to sch from referral//WQ. Pt declined appt because he believes he doesn't need one at this time and requests a call back to discuss if its ok for him to schedule with CHF clinic please call.

## 2015-07-24 NOTE — Telephone Encounter (Signed)
CHF clinic appointment scheduled on 08/06/15.

## 2015-07-24 NOTE — Telephone Encounter (Signed)
Ms. Neoma Laming can you help with this referral please?

## 2015-07-24 NOTE — Telephone Encounter (Signed)
The referral did go to that office  Pt scheduled for 08-06-2015@2 :40 pm their office called pt to inform   [facility] [phone] [address]  [place], [place] [address]

## 2015-07-28 ENCOUNTER — Other Ambulatory Visit (INDEPENDENT_AMBULATORY_CARE_PROVIDER_SITE_OTHER): Payer: Commercial Managed Care - HMO

## 2015-07-28 DIAGNOSIS — E119 Type 2 diabetes mellitus without complications: Secondary | ICD-10-CM | POA: Diagnosis not present

## 2015-07-28 DIAGNOSIS — E875 Hyperkalemia: Secondary | ICD-10-CM | POA: Diagnosis not present

## 2015-07-28 LAB — BASIC METABOLIC PANEL
BUN: 16 mg/dL (ref 6–23)
CHLORIDE: 105 meq/L (ref 96–112)
CO2: 31 meq/L (ref 19–32)
CREATININE: 0.86 mg/dL (ref 0.40–1.50)
Calcium: 9.3 mg/dL (ref 8.4–10.5)
GFR: 92.94 mL/min (ref >60.00)
Glucose, Bld: 156 mg/dL — ABNORMAL HIGH (ref 70–99)
POTASSIUM: 4 meq/L (ref 3.5–5.1)
SODIUM: 142 meq/L (ref 135–145)

## 2015-07-28 LAB — HEMOGLOBIN A1C: HEMOGLOBIN A1C: 7.5 % — AB (ref 4.6–6.5)

## 2015-07-29 ENCOUNTER — Other Ambulatory Visit: Payer: Self-pay | Admitting: Internal Medicine

## 2015-07-29 ENCOUNTER — Telehealth: Payer: Self-pay | Admitting: Cardiology

## 2015-07-29 DIAGNOSIS — G4733 Obstructive sleep apnea (adult) (pediatric): Secondary | ICD-10-CM

## 2015-07-29 NOTE — Telephone Encounter (Signed)
Please let patient know that he has moderate to severe OSA and set up for CPAP titration in lab

## 2015-07-31 NOTE — Telephone Encounter (Signed)
Informed patient of results... He is very hesitant to proceeding with Titration.   He asked me to give him a little bit of time and he will call me back to discuss more in depth.   If I have not heard back from him within a week I will call him back to discuss.

## 2015-08-01 NOTE — Telephone Encounter (Signed)
Patient called to say that he is not against doing the titration but that he cannot sleep in a bed.  He said if he lays in a bed the max amount of time that he can lay there is an hour.  He is questioning if this is something that he still needs to go through, and if so will they be able to have anything besides a bed for him to sleep in.    Routed to Dr. Radford Pax for questioning.

## 2015-08-04 ENCOUNTER — Encounter: Payer: Self-pay | Admitting: *Deleted

## 2015-08-04 NOTE — Addendum Note (Signed)
Addended by: Andres Ege on: 08/04/2015 03:50 PM   Modules accepted: Orders

## 2015-08-04 NOTE — Telephone Encounter (Signed)
Please find out if sleep lab can use a chair

## 2015-08-04 NOTE — Telephone Encounter (Signed)
A recliner can be used and a note has been made in his chart that this needs to be used for his titration.   Patient is aware.

## 2015-08-05 ENCOUNTER — Encounter: Payer: Self-pay | Admitting: Podiatry

## 2015-08-05 ENCOUNTER — Ambulatory Visit (INDEPENDENT_AMBULATORY_CARE_PROVIDER_SITE_OTHER): Payer: Commercial Managed Care - HMO | Admitting: Podiatry

## 2015-08-05 VITALS — BP 100/62 | HR 72 | Resp 16 | Ht 72.0 in | Wt 215.0 lb

## 2015-08-05 DIAGNOSIS — L603 Nail dystrophy: Secondary | ICD-10-CM | POA: Diagnosis not present

## 2015-08-05 DIAGNOSIS — L600 Ingrowing nail: Secondary | ICD-10-CM | POA: Diagnosis not present

## 2015-08-05 MED ORDER — NEOMYCIN-POLYMYXIN-HC 1 % OT SOLN: OTIC | Status: DC

## 2015-08-05 NOTE — Patient Instructions (Signed)

## 2015-08-05 NOTE — Progress Notes (Signed)
   Subjective:    Patient ID: Carl Wood, male    DOB: [birth year], 74 y.o.   MRN: [id number]  HPI : he presents today with a chief complaint of painful hallux nails bilateral. He states that they grow so weird [place] Hayward and the margins grow and iatrogenic mallet myself he states his been like this for more than a year now. They hurt and I would like to have something done about them.    Review of Systems  All other systems reviewed and are negative.      Objective:   Physical Exam : 74 year old white male with a history of heart failure with an implantable defibrillator appears to be in no acute distress vital signs are stable alert and oriented 3. Pulses are strongly palpable bilateral. Neurologic sensorium is intact per Semmes-Weinstein monofilament. Deep tendon reflexes are intact bilateral and muscle strength +5 over 5 dorsiflexion and plantar flexors and inverters and everters all intrinsic musculature is intact. Orthopedic evaluation demonstrates all joints distal to the ankle level for range of motion without crepitation. Cutaneous evaluation demonstrates supple well-hydrated cutis thick yellow dystrophic Brown hallux nails bilateral with distal onycholysis. They are painful on palpation as well as movement.        Assessment & Plan:   assessment: severe nail dystrophy with paronychia abscess hallux bilateral.  Plan: matrixectomy's of the hallux bilateral was performed with total nail avulsion after local anesthesia was administered. He tolerated this procedure well and Surgicel was applied to help prevent bleeding. 3 applications of phenol had been applied neutralizing isopropyl alcohol dry sterile compressive dressing was applied. He was given both oral home-going instructions for care and soaking of his toes as well as a prescription for Cortisporin Otic. I will follow-up with him in 1 week just to reevaluate.

## 2015-08-06 ENCOUNTER — Ambulatory Visit (HOSPITAL_COMMUNITY)
Admission: RE | Admit: 2015-08-06 | Discharge: 2015-08-06 | Disposition: A | Discharge status: Home / Self Care | Payer: Commercial Managed Care - HMO | Source: Ambulatory Visit | Attending: Internal Medicine | Admitting: Internal Medicine

## 2015-08-06 ENCOUNTER — Encounter (HOSPITAL_COMMUNITY): Payer: Self-pay

## 2015-08-06 VITALS — BP 124/70 | HR 69 | Wt 213.0 lb

## 2015-08-06 DIAGNOSIS — Z7901 Long term (current) use of anticoagulants: Secondary | ICD-10-CM | POA: Insufficient documentation

## 2015-08-06 DIAGNOSIS — I5022 Chronic systolic (congestive) heart failure: Secondary | ICD-10-CM | POA: Diagnosis not present

## 2015-08-06 DIAGNOSIS — G4733 Obstructive sleep apnea (adult) (pediatric): Secondary | ICD-10-CM | POA: Diagnosis not present

## 2015-08-06 DIAGNOSIS — I447 Left bundle-branch block, unspecified: Secondary | ICD-10-CM | POA: Diagnosis not present

## 2015-08-06 DIAGNOSIS — Z87891 Personal history of nicotine dependence: Secondary | ICD-10-CM | POA: Insufficient documentation

## 2015-08-06 DIAGNOSIS — I482 Chronic atrial fibrillation: Secondary | ICD-10-CM | POA: Insufficient documentation

## 2015-08-06 DIAGNOSIS — I255 Ischemic cardiomyopathy: Secondary | ICD-10-CM

## 2015-08-06 MED ORDER — SACUBITRIL-VALSARTAN 24-26 MG PO TABS: 1.0000 | ORAL_TABLET | Freq: Two times a day (BID) | ORAL | Status: DC

## 2015-08-06 NOTE — Progress Notes (Signed)
Carl Wood ID: Carl Wood, male   DOB: 1941-11-14, 74 y.o.   MRN: [id number] Referring Physician: Dr Yong Channel  Primary Care: Dr Yong Channel  Primary Cardiologist: EP: Dr Caryl Comes  Neurologists: Dr Leonie Man  HPI: Carl Wood is a 74 year old with a history of MI 1983, ICM, chronic systolic heart failure s/p Medtronic single chamber ICD, CVA 2014, DM , VT, PE, ,htn, permanent A fib on Xarelto, former smoker 2014  referred to the HF clinic by Dr Yong Channel for heart failure.    Has not had cath in the last 10 years.  Today he presents as a new Carl Wood. In the last year he has noticed increased fatigue. Complains of leg fatigue. Says he can walk far but has to take his time to walk. Leans on the grocery cart. Rarely has chest pain. +Orthopnea sleeps in a recliner, + PND. Denies dizziness. Recently built deck for his daughter. No ICD shocks.  Has never needed diuretics.  No bleeding problems. Has plan for CPAP initiation 10/2015. Occasionally has left ankle edema. Lives with his wife. Retired Glass blower/designer.    06/2015 ECHO EF 20% Severely dilated mild TR,  12/2012 ECHO EF 30-35%   07/28/2015: k 4.0 Creatinine 0.86   Review of Systems: [y] = yes, [ ]  = no   General: Weight gain [ ] ; Weight loss [ ] ; Anorexia [ ] ; Fatigue [Y ]; Fever [ ] ; Chills [ ] ; Weakness [Y ]  Cardiac: Chest pain/pressure [ ] ; Resting SOB [ ] ; Exertional SOB [ ] ; Orthopnea [ ] ; Pedal Edema [Y ]; Palpitations [ ] ; Syncope [ ] ; Presyncope [ ] ; Paroxysmal nocturnal dyspnea[ ]   Pulmonary: Cough [ ] ; Wheezing[ ] ; Hemoptysis[ ] ; Sputum [ ] ; Snoring [ ]   GI: Vomiting[ ] ; Dysphagia[ ] ; Melena[ ] ; Hematochezia [ ] ; Heartburn[ ] ; Abdominal pain [ ] ; Constipation [ ] ; Diarrhea [ ] ; BRBPR [ ]   GU: Hematuria[ ] ; Dysuria [ ] ; Nocturia[ ]   Vascular: Pain in legs with walking [Y ]; Pain in feet with lying flat [ ] ; Non-healing sores [ ] ; Stroke [ ] ; TIA [ ] ; Slurred speech [ ] ;  Neuro: Headaches[ ] ; Vertigo[ ] ; Seizures[ ] ; Paresthesias[ ] ;Blurred vision [ ] ;  Diplopia [ ] ; Vision changes [ ]   Ortho/Skin: Arthritis Blue.Reese ]; Joint pain [Y ]; Muscle pain [ ] ; Joint swelling [ ] ; Back Pain [ ] ; Rash [ ]   Psych: Depression[ ] ; Anxiety[ ]   Heme: Bleeding problems [ ] ; Clotting disorders [ ] ; Anemia [ ]   Endocrine: Diabetes [ Y]; Thyroid dysfunction[ ]    Past Medical History  Diagnosis Date  . PE (pulmonary embolism)   . Colon polyps   . CHF (congestive heart failure)   . CAD (coronary artery disease)   . Diabetes mellitus   . Diverticulosis of colon   . Hyperlipidemia   . Hypertension   . MI (mitral incompetence)   . Nephrolithiasis   . V-tach   . NEPHROLITHIASIS 06/26/2008    Qualifier: Diagnosis of  By: Sherlynn Stalls, CMA, [place]    . DIVERTICULOSIS, COLON 10/16/2006    Qualifier: Diagnosis of  By: Leanne Chang MD, [place]      Current Outpatient Prescriptions  Medication Sig Dispense Refill  . atorvastatin (LIPITOR) 80 MG tablet TAKE ONE TABLET BY MOUTH ONCE DAILY AS DIRECTED 30 tablet 3  . carvedilol (COREG) 6.25 MG tablet Take 1 tablet (6.25 mg total) by mouth 2 (two) times daily. 180 tablet 2  . digoxin (LANOXIN) 0.25 MG tablet Take one-half tablet by mouth  daily 90 tablet 0  . losartan (COZAAR) 50 MG tablet Take 0.5 tablets (25 mg total) by mouth daily. 90 tablet 3  . NEOMYCIN-POLYMYXIN-HYDROCORTISONE (CORTISPORIN) 1 % SOLN otic solution Apply 1-2 drops to toe BID after soaking 10 mL 1  . XARELTO 20 MG TABS tablet TAKE ONE TABLET BY MOUTH ONCE DAILY 30 tablet 1   No current facility-administered medications for this encounter.    Allergies  Allergen Reactions  . Penicillins     REACTION: unspecified  . Sulfamethoxazole     REACTION: unspecified      Social History   Social History  . Marital Status: Married    Spouse Name: [name]  . Number of Children: 3  . Years of Education: 12   Occupational History  . Retired    Social History Main Topics  . Smoking status: Former Smoker -- 0.75 packs/day for 50 years    Types: Cigarettes     Quit date: 12/15/2012  . Smokeless tobacco: Never Used  . Alcohol Use: No  . Drug Use: No  . Sexual Activity: Not on file   Other Topics Concern  . Not on file   Social History Narrative   Married. 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 Colon cancer Father   . Diabetes Father   . Heart disease Father   . Bladder Cancer Mother   . Heart disease Mother     [name] Vitals:   08/06/15 1452  BP: 124/70  Pulse: 69  Weight: 213 lb (96.616 kg)  SpO2: 95%    PHYSICAL EXAM: General:  Well appearing. No respiratory difficulty HEENT: normal Neck: supple. no JVD. Carotids 2+ bilat; no bruits. No lymphadenopathy or thryomegaly appreciated. Cor: PMI nondisplaced. Regular rate & rhythm. No rubs, gallops or murmurs. Lungs: clear Abdomen: soft, nontender, nondistended. No hepatosplenomegaly. No bruits or masses. Good bowel sounds. Extremities: no cyanosis, clubbing, rash, trace lower extremity edema Neuro: alert & oriented x 3, cranial nerves grossly intact. moves all 4 extremities w/o difficulty. Affect pleasant.     ASSESSMENT & PLAN:  1. Chronic Systolic HF- ICM Medtronic ICD. NYHA II-III. ECHO results reviewed. LV dysfunction down a little over the last couple of years.  Volume status stable.  Continue current dose of carvedilol and digoxin, Stop losartan and start 24-26 mg entresto twice a day.  Set up CPX to determine if limitations are pulmonary versus cardiac ? Could consider BiV QRS 114ms 2. OSA- to start CPAP in November 3. Former smoker - quit 2014 4. Chronic A fib - rate controlled continue current dose of bb and xarelto .   Follow up in 6 weeks.   [name], NP-C  Carl Wood seen and examined with Darrick Grinder, NP. We  discussed all aspects of the encounter. I agree with the assessment and plan as stated above.   Discussed role of HF Clinic in Detail.Suspect he is early NYHA III but could be more severe limitation. Volume status looks good. Will plan CPX testing to further stratify. ECG is atypical LBBB but QRS is 127ms. Dr. Caryl Comes has been hesitant to upgrade to BiV due to morphology of LBBB and possibility of being non-responder. Will await CPX testing results to guide further  decision making.

## 2015-08-06 NOTE — Patient Instructions (Addendum)
Stop Losartan  Start Entresto 24/26 mg Twice daily   Your physician has recommended that you have a cardiopulmonary stress test (CPX). CPX testing is a non-invasive measurement of heart and lung function. It replaces a traditional treadmill stress test. This type of test provides a tremendous amount of information that relates not only to your present condition but also for future outcomes. This test combines measurements of you ventilation, respiratory gas exchange in the lungs, electrocardiogram (EKG), blood pressure and physical response before, during, and following an exercise protocol.  Your physician recommends that you schedule a follow-up appointment in: 6 weeks

## 2015-08-07 ENCOUNTER — Ambulatory Visit: Payer: Commercial Managed Care - HMO | Admitting: Nurse Practitioner

## 2015-08-07 ENCOUNTER — Ambulatory Visit (INDEPENDENT_AMBULATORY_CARE_PROVIDER_SITE_OTHER): Payer: Commercial Managed Care - HMO | Admitting: Adult Health

## 2015-08-07 ENCOUNTER — Encounter: Payer: Self-pay | Admitting: Adult Health

## 2015-08-07 VITALS — BP 120/64 | HR 68 | Resp 16 | Ht 72.0 in | Wt 214.4 lb

## 2015-08-07 DIAGNOSIS — Z8673 Personal history of transient ischemic attack (TIA), and cerebral infarction without residual deficits: Secondary | ICD-10-CM | POA: Diagnosis not present

## 2015-08-07 NOTE — Patient Instructions (Signed)
Continue Xarelto for secondary stroke prevention and strict control of hypertension with blood pressure goal below 130/90.  If your symptoms worsen or you develop new symptoms please let us know.

## 2015-08-07 NOTE — Progress Notes (Signed)
PATIENT: Carl Wood DOB: [birth year]  REASON FOR VISIT: follow up- stroke HISTORY FROM: patient  HISTORY OF PRESENT ILLNESS:  Carl Wood is a 74 year old male with a history of stroke in 2014. He returns today for follow-up. The patient is currently taking Xarelto for stroke prevention. Denies any significant bruising or bleeding. The patient's blood pressure has been controlled. He states that sometimes it actually runs lower at home. Today his blood pressure is excellent. The patient's primary care has been checking his cholesterol and hemoglobin A1c. His latest hemoglobin A1c was slightly elevated. The patient has been monitoring his diet. The patient denies any strokelike symptoms. He returns today for an evaluation  HISTORY ([place]): Carl Wood is an 74 y.o. male history of pulmonary embolism, atrial fibrillation, on Coumadin, congestive heart failure, diabetes mellitus, hypertension and hyperlipidemia, presenting with onset of speech difficulty and right-sided weakness on 12/19/2012. Has no previous history of stroke nor TIA. Patient has been on Coumadin long-term Coumadin suspended 2 weeks ago for colonoscopy. Coumadin was restarted on 12/11/2012. INR today was 1.49. Initial CT scan showed no acute intracranial abnormality. Initial NIH stroke score was 5. Patient improved in the emergency room to a stroke score of 1. Thrombolytic therapy with IV TPA was not elected because of the minimal residual neurologic deficits. Shortly before noon patient had a relapse of deficits with expressive aphasia and right hemiplegia. Repeat CT scan showed hypodensity involving the left MCA indicative of intravascular thrombus. Patient was taken to interventional radiology. Cerebral angiography demonstrated thrombosis involving the left internal carotid artery just above the common carotid artery bifurcation and extending into the middle cerebral and anterior cerebral artery branches. Patient  subsequently underwent intra-arterial TPA infusion followed by thrombectomy. MCA territory circulation was restored and all branches. Patient had good collateral filling of the left ACA via the anterior communicating artery.   Update 05/01/13: Carl Wood comes in today for 3 months stroke follow up. He is doing well and has no residual right-sided weakness or speech difficulty. He states he has a feeling of something caught in the back of his throat often, which causes a nagging cough, but he swallows fine. He states Dr. Caryl Comes stopped his Enalapril thinking it may be causing the cough, but he still has it. His risk factors of Diabetes are controlled with weight loss and low carb diet, HgbA1c is 6.5. Hyperlipidemia is well controlled on Lipitor, LDL 61, total cholesterol 113. Patient was discharged on Xarelto for long-standing afib. Patient denies medication side effects, with no signs of bleeding. NIHSS 0, MRS 1.   UPDATE 08/01/13 (LL): Patient returns for follow up, last visit on 05/01/13. Reports he is doing fine, no new neurovascular symptoms. BP well controlled, actually had dose reduced due to hypotension. Blood sugars are well controlled. Patient on Xarelto for long-standing afib. Patient denies medication side effects, with no signs of bleeding or excessive bruising. Still has nagging cough, feeling of phlegm in throat. Was prescribed Nasonex but too expensive. Recommended trying generic Flonase.  UPDATE 02/01/14 (LL): Carl Wood returns for stroke followup. He is doing well and has no complaints. His BP and cholesterol are well controlled. He is tolerating Xarelto well without any significant bleeding or bruising. UPDATE 08/06/2014 : He returns for followup of loss was at 6 ago. He continues to do well without recurrence stroke or TIA symptoms. He is tolerating Xarelto quite well without significant bleeding or bruising. He has not had any new medical problems or any  medication changes. He has no new  complaints today.  REVIEW OF SYSTEMS: Out of a complete 14 system review of symptoms, the patient complains only of the following symptoms, and all other reviewed systems are negative.  Apnea, snoring  ALLERGIES: Allergies  Allergen Reactions  . Penicillins     REACTION: unspecified  . Sulfamethoxazole     REACTION: unspecified    HOME MEDICATIONS: Outpatient Prescriptions Prior to Visit  Medication Sig Dispense Refill  . atorvastatin (LIPITOR) 80 MG tablet TAKE ONE TABLET BY MOUTH ONCE DAILY AS DIRECTED 30 tablet 3  . carvedilol (COREG) 6.25 MG tablet Take 1 tablet (6.25 mg total) by mouth 2 (two) times daily. 180 tablet 2  . digoxin (LANOXIN) 0.25 MG tablet Take one-half tablet by mouth daily 90 tablet 0  . NEOMYCIN-POLYMYXIN-HYDROCORTISONE (CORTISPORIN) 1 % SOLN otic solution Apply 1-2 drops to toe BID after soaking 10 mL 1  . sacubitril-valsartan (ENTRESTO) 24-26 MG Take 1 tablet by mouth 2 (two) times daily. 60 tablet 3  . XARELTO 20 MG TABS tablet TAKE ONE TABLET BY MOUTH ONCE DAILY 30 tablet 1   No facility-administered medications prior to visit.    PAST MEDICAL HISTORY: Past Medical History  Diagnosis Date  . PE (pulmonary embolism)   . Colon polyps   . CHF (congestive heart failure)   . CAD (coronary artery disease)   . Diabetes mellitus   . Diverticulosis of colon   . Hyperlipidemia   . Hypertension   . MI (mitral incompetence)   . Nephrolithiasis   . V-tach   . NEPHROLITHIASIS 06/26/2008    Qualifier: Diagnosis of  By: Sherlynn Stalls, CMA, [place]    . DIVERTICULOSIS, COLON 10/16/2006    Qualifier: Diagnosis of  By: Leanne Chang MD, [place]      PAST SURGICAL HISTORY: Past Surgical History  Procedure Laterality Date  . Back surgery    . Cardiac defibrillator placement      medtronic virtuoso  . Basal cell carcinoma excision      nose  . Knee arthroplasty    . Cholecystectomy    . Colonoscopy  12/11/2012    Procedure: COLONOSCOPY;  Surgeon: Inda Castle, MD;   Location: [facility];  Service: Endoscopy;  Laterality: N/A;    FAMILY HISTORY: Family History  Problem Relation Age of Onset  . Colon cancer Father   . Diabetes Father   . Heart disease Father   . Bladder Cancer Mother   . Heart disease Mother     SOCIAL HISTORY: Social History   Social History  . Marital Status: Married    Spouse Name: [name]  . Number of Children: 3  . Years of Education: 12   Occupational History  . Retired    Social History Main Topics  . Smoking status: Former Smoker -- 0.75 packs/day for 50 years    Types: Cigarettes    Quit date: 12/15/2012  . Smokeless tobacco: Never Used  . Alcohol Use: No  . Drug Use: No  . Sexual Activity: Not on file   Other Topics Concern  . Not on file   Social History Narrative   Married. 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  Generalized: Well developed, in no acute distress   Neurological examination  Mentation: Alert oriented to time, place, history taking. Follows all commands speech and language fluent Cranial nerve II-XII: Pupils were equal round reactive to light. Extraocular movements were full, visual field were full on confrontational test. Facial sensation and strength were normal. Uvula tongue midline. Head turning and shoulder shrug  were normal and symmetric. Motor: The motor testing reveals 5 over 5 strength of all 4 extremities. Good symmetric motor tone is noted throughout.  Sensory: Sensory testing is intact to soft touch on all 4 extremities. No evidence of extinction is noted.  Coordination: Cerebellar testing  reveals good finger-nose-finger and heel-to-shin bilaterally.  Gait and station: Gait is normal. Tandem gait is normal. Romberg is negative. No drift is seen.  Reflexes: Deep tendon reflexes are symmetric and normal bilaterally.   DIAGNOSTIC DATA (LABS, IMAGING, TESTING) - I reviewed patient records, labs, notes, testing and imaging myself where available.  Lab Results  Component Value Date   WBC 6.1 07/21/2015   HGB 15.8 07/21/2015   HCT 47.1 07/21/2015   MCV 98.6 07/21/2015   PLT 172.0 07/21/2015      Component Value Date/Time   NA 142 07/28/2015 1013   K 4.0 07/28/2015 1013   CL 105 07/28/2015 1013   CO2 31 07/28/2015 1013   GLUCOSE 156* 07/28/2015 1013   GLUCOSE 98 10/10/2006 1113   BUN 16 07/28/2015 1013   CREATININE 0.86 07/28/2015 1013   CALCIUM 9.3 07/28/2015 1013   PROT 7.7 07/21/2015 1031   ALBUMIN 4.3 07/21/2015 1031   AST 19 07/21/2015 1031   ALT 18 07/21/2015 1031   ALKPHOS 86 07/21/2015 1031   BILITOT 0.9 07/21/2015 1031   GFRNONAA >90 12/21/2012 0500   GFRAA >90 12/21/2012 0500   Lab Results  Component Value Date   CHOL 130 11/15/2014   HDL 35.40* 11/15/2014   LDLCALC 83 11/15/2014   LDLDIRECT 71.0 03/17/2015   TRIG 59.0 11/15/2014   CHOLHDL 4 11/15/2014   Lab Results  Component Value Date   HGBA1C 7.5* 07/28/2015   No results found for: [id number] Lab Results  Component Value Date   TSH 2.27 07/21/2015      ASSESSMENT AND PLAN 74 y.o. year old male  has a past medical history of PE (pulmonary embolism); Colon polyps; CHF (congestive heart failure); CAD (coronary artery disease); Diabetes mellitus; Diverticulosis of colon; Hyperlipidemia; Hypertension; MI (mitral incompetence); Nephrolithiasis; V-tach; NEPHROLITHIASIS (06/26/2008); and DIVERTICULOSIS, COLON (10/16/2006). here with:  1. History of stroke  Overall the patient has done well. He will continue on Xarelto for stroke prevention. The patient is encouraged to maintain strict control  of his blood pressure with goal less than 130/90. The patient's primary care has been managing his hypertension, hyperlipidemia and diabetes. Patient advised that if he develops any strokelike symptoms he should call 911 immediately. He expressed understanding. The patient has remained stable over the last 2 years. At this point the patient will continue to follow with his primary care and can follow-up with our office on an as-needed basis.   Ward Givens, MSN, NP-C 08/07/2015, 11:06 AM [facility] [address], [place],  [address] [phone]

## 2015-08-08 NOTE — Progress Notes (Signed)
I agree with the above plan 

## 2015-08-12 ENCOUNTER — Ambulatory Visit (INDEPENDENT_AMBULATORY_CARE_PROVIDER_SITE_OTHER): Payer: Commercial Managed Care - HMO | Admitting: Podiatry

## 2015-08-12 DIAGNOSIS — L600 Ingrowing nail: Secondary | ICD-10-CM

## 2015-08-12 NOTE — Patient Instructions (Signed)

## 2015-08-12 NOTE — Progress Notes (Signed)
He presents today for follow-up of bilateral nail avulsions with matrixectomy's. He states this seems to be doing pretty well.  Objective: Vital signs are stable he is alert and oriented 3. Mild hepatitis dorsal aspect right foot secondary to Betadine. The nail sites appear to be healing very well to the bilateral hallux. Pulses remain palpable bilateral.  Assessment: Well-healing surgical toes hallux bilateral.  Plan: Discontinue Betadine sterile with Epsom salts and water soaks, and the daily evident night. Continue to soak until completely resolved. While exercising symptoms of worsening infection. If it should occur notify us immediately.

## 2015-08-13 ENCOUNTER — Ambulatory Visit (HOSPITAL_COMMUNITY): Payer: Commercial Managed Care - HMO | Attending: Internal Medicine

## 2015-08-13 ENCOUNTER — Telehealth (HOSPITAL_COMMUNITY): Payer: Self-pay | Admitting: *Deleted

## 2015-08-13 DIAGNOSIS — I255 Ischemic cardiomyopathy: Secondary | ICD-10-CM | POA: Diagnosis not present

## 2015-08-13 NOTE — Telephone Encounter (Signed)
Completed PA for pt's Entresto through [place], med approved through 08/12/17, [facility] aware

## 2015-09-03 ENCOUNTER — Other Ambulatory Visit: Payer: Self-pay | Admitting: Family Medicine

## 2015-09-03 ENCOUNTER — Encounter: Payer: Self-pay | Admitting: Internal Medicine

## 2015-09-03 ENCOUNTER — Ambulatory Visit (INDEPENDENT_AMBULATORY_CARE_PROVIDER_SITE_OTHER): Payer: Commercial Managed Care - HMO | Admitting: *Deleted

## 2015-09-03 DIAGNOSIS — I255 Ischemic cardiomyopathy: Secondary | ICD-10-CM | POA: Diagnosis not present

## 2015-09-04 NOTE — Progress Notes (Signed)
Remote ICD transmission.   

## 2015-09-10 LAB — CUP PACEART REMOTE DEVICE CHECK
Date Time Interrogation Session: 20160928083727
HIGH POWER IMPEDANCE MEASURED VALUE: 80 Ohm
Lead Channel Impedance Value: 480 Ohm
Lead Channel Setting Sensing Sensitivity: 0.3 mV
MDC IDC MSMT BATTERY VOLTAGE: 2.83 V
MDC IDC MSMT LEADCHNL RV SENSING INTR AMPL: 8.464 mV
MDC IDC SET LEADCHNL RV PACING AMPLITUDE: 2.5 V
MDC IDC SET LEADCHNL RV PACING PULSEWIDTH: 0.8 ms
MDC IDC SET ZONE DETECTION INTERVAL: 280 ms
MDC IDC SET ZONE DETECTION INTERVAL: 330 ms
MDC IDC STAT BRADY RV PERCENT PACED: 1.61 %
Zone Setting Detection Interval: 430 ms

## 2015-09-17 ENCOUNTER — Telehealth: Payer: Self-pay | Admitting: Family Medicine

## 2015-09-17 ENCOUNTER — Telehealth (HOSPITAL_COMMUNITY): Payer: Self-pay | Admitting: Vascular Surgery

## 2015-09-17 NOTE — Telephone Encounter (Signed)
Pt states he received a denial/ bill from [place] denying his visit to [facility] at [facility] In one area it states pt refused, but when you go to the notes, it states pt has been scheduled. Pt states he never refused an appt, he actually went to appt

## 2015-09-17 NOTE — Telephone Encounter (Signed)
Pt called he wants his results from cpx done 08/13/15 .Marland Kitchen Please advise

## 2015-09-26 DIAGNOSIS — E119 Type 2 diabetes mellitus without complications: Secondary | ICD-10-CM | POA: Diagnosis not present

## 2015-09-26 LAB — HM DIABETES EYE EXAM

## 2015-09-30 ENCOUNTER — Encounter: Payer: Self-pay | Admitting: Family Medicine

## 2015-10-06 ENCOUNTER — Other Ambulatory Visit: Payer: Self-pay | Admitting: Internal Medicine

## 2015-10-15 ENCOUNTER — Encounter: Payer: Self-pay | Admitting: Cardiology

## 2015-10-20 ENCOUNTER — Ambulatory Visit (HOSPITAL_BASED_OUTPATIENT_CLINIC_OR_DEPARTMENT_OTHER): Payer: Commercial Managed Care - HMO | Attending: Cardiology

## 2015-10-20 DIAGNOSIS — Z79899 Other long term (current) drug therapy: Secondary | ICD-10-CM | POA: Insufficient documentation

## 2015-10-20 DIAGNOSIS — R0683 Snoring: Secondary | ICD-10-CM | POA: Insufficient documentation

## 2015-10-20 DIAGNOSIS — I493 Ventricular premature depolarization: Secondary | ICD-10-CM | POA: Insufficient documentation

## 2015-10-20 DIAGNOSIS — G4733 Obstructive sleep apnea (adult) (pediatric): Secondary | ICD-10-CM

## 2015-10-20 DIAGNOSIS — Z7901 Long term (current) use of anticoagulants: Secondary | ICD-10-CM | POA: Diagnosis not present

## 2015-10-20 DIAGNOSIS — I4891 Unspecified atrial fibrillation: Secondary | ICD-10-CM | POA: Insufficient documentation

## 2015-10-20 DIAGNOSIS — G4730 Sleep apnea, unspecified: Secondary | ICD-10-CM | POA: Diagnosis present

## 2015-11-02 ENCOUNTER — Telehealth: Payer: Self-pay | Admitting: Cardiology

## 2015-11-02 NOTE — Telephone Encounter (Signed)
Pt had successful PAP titration. Please setup appointment in 10 weeks. Please let [facility] know that order for PAP is in EPIC.   

## 2015-11-02 NOTE — Sleep Study (Signed)
   Patient Name: Carl Wood, Carl Wood MRN: [id number] Study Date: 10/20/2015 Gender: Male D.O.B: [birth year] Age (years): 73 Referring Provider: Fransico Him MD, ABSM Interpreting Physician: Fransico Him MD, ABSM RPSGT: Madelon Lips  Weight (lbs): 220 Height (inches): 72 BMI: 30 Neck Size: 16.25  CLINICAL INFORMATION The patient is referred for a CPAP titration to treat sleep apnea. Date of NPSG, Split Night or HST:06/13/2015  SLEEP STUDY TECHNIQUE As per the AASM Manual for the Scoring of Sleep and Associated Events v2.3 (April 2016) with a hypopnea requiring 4% desaturations. The channels recorded and monitored were frontal, central and occipital EEG, electrooculogram (EOG), submentalis EMG (chin), nasal and oral airflow, thoracic and abdominal wall motion, anterior tibialis EMG, snore microphone, electrocardiogram, and pulse oximetry. Continuous positive airway pressure (CPAP) was initiated at the beginning of the study and titrated to treat sleep-disordered breathing.  MEDICATIONS Medications taken by the patient : Atorvastati, Carvedilol, Digoxin, Entreston, Xarelto. Medications administered by patient during sleep study : No sleep medicine administered.  TECHNICIAN COMMENTS Comments added by technician: Patient was restless all through the night.  Comments added by scorer: N/A  RESPIRATORY PARAMETERS Optimal PAP Pressure (cm): 8  AHI at Optimal Pressure (/hr):1.7 Overall Minimal O2 (%):86.00   Supine % at Optimal Pressure (%):N/A Minimal O2 at Optimal Pressure (%):89.00      SLEEP ARCHITECTURE The study was initiated at 10:48:15 PM and ended at 5:04:36 AM. Sleep onset time was 41.0 minutes and the sleep efficiency was reduced at 56.3%. The total sleep time was 211.9 minutes. The patient spent 17.94% of the night in stage N1 sleep, 77.58% in stage N2 sleep, 0.00% in stage N3 and 4.48% in REM.Stage REM latency was 105.0 minutes Wake after sleep onset was 123.5. Alpha intrusion  was absent. Supine sleep was 100.00%.  CARDIAC DATA The 2 lead EKG demonstrated sinus rhythm. The mean heart rate was 57.69 beats per minute. Other EKG findings include: Atrial Fibrillation, PVCs.  LEG MOVEMENT DATA The total Periodic Limb Movements of Sleep (PLMS) were 0. The PLMS index was 0.00. A PLMS index of <15 is considered normal in adults.  IMPRESSIONS - An optimal PAP pressure  of 8cm H2O was selected for this patient based on the available study data. - Central sleep apnea was not noted during this titration (CAI = 0.0/h). - Moderate oxygen desaturations were observed during this titration (min O2 = 86.00%). - The patient snored with Moderate snoring volume during this titration study. - 2-lead EKG demonstrated: Atrial Fibrillation, PVCs - Clinically significant periodic limb movements were not noted during this study. Arousals associated with PLMs were rare.  DIAGNOSIS - Obstructive Sleep Apnea (327.23 [G47.33 ICD-10])  RECOMMENDATIONS - Recommend CPAP at 8cm H2O with heated humidity and mask of choice.  - Avoid alcohol, sedatives and other CNS depressants that may worsen sleep apnea and disrupt normal sleep architecture. - Sleep hygiene should be reviewed to assess factors that may improve sleep quality. - Weight management and regular exercise should be initiated or continued. - Return to Sleep Center for re-evaluation after 10 weeks of therapy   [place], American Board of Sleep Medicine  ELECTRONICALLY SIGNED ON:  11/02/2015, 4:09 PM [place] PH: [phone]   FX: [phone] [place]

## 2015-11-02 NOTE — Addendum Note (Signed)
Addended by: Fransico Him R on: 11/02/2015 04:17 PM   Modules accepted: Orders

## 2015-11-07 NOTE — Telephone Encounter (Signed)
Patient informed of results. Due to a time gap in follow-up time he will be getting set up with [facility] in about 2-3 weeks.  This will allow proper follow-up time.   Once he is set up with his machine I will schedule 10 week follow-up

## 2015-11-11 ENCOUNTER — Telehealth (HOSPITAL_COMMUNITY): Payer: Self-pay | Admitting: Cardiology

## 2015-11-11 NOTE — Telephone Encounter (Signed)
Pt came to office to request samples as he is in the donut hole with medicare  Medication Samples have been provided to the patient.  Drug name: Alyson Reedy: 28  LOT: [id number]  Exp.Date: 10/17  The patient has been instructed regarding the correct time, dose, and frequency of taking this medication, including desired effects and most common side effects.   Kerry Dory 12:49 PM 11/11/2015

## 2015-11-18 ENCOUNTER — Other Ambulatory Visit: Payer: Self-pay | Admitting: Family Medicine

## 2015-11-18 ENCOUNTER — Encounter: Payer: Self-pay | Admitting: Family Medicine

## 2015-11-18 ENCOUNTER — Ambulatory Visit (INDEPENDENT_AMBULATORY_CARE_PROVIDER_SITE_OTHER): Payer: Commercial Managed Care - HMO | Admitting: Family Medicine

## 2015-11-18 VITALS — BP 132/70 | HR 78 | Temp 98.5°F | Wt 219.0 lb

## 2015-11-18 DIAGNOSIS — I10 Essential (primary) hypertension: Secondary | ICD-10-CM

## 2015-11-18 DIAGNOSIS — I5022 Chronic systolic (congestive) heart failure: Secondary | ICD-10-CM | POA: Diagnosis not present

## 2015-11-18 DIAGNOSIS — E119 Type 2 diabetes mellitus without complications: Secondary | ICD-10-CM

## 2015-11-18 LAB — COMPREHENSIVE METABOLIC PANEL
ALK PHOS: 82 U/L (ref 39–117)
ALT: 18 U/L (ref 0–53)
AST: 18 U/L (ref 0–37)
Albumin: 4.1 g/dL (ref 3.5–5.2)
BILIRUBIN TOTAL: 0.9 mg/dL (ref 0.2–1.2)
BUN: 12 mg/dL (ref 6–23)
CALCIUM: 9.5 mg/dL (ref 8.4–10.5)
CO2: 32 meq/L (ref 19–32)
Chloride: 103 mEq/L (ref 96–112)
Creatinine, Ser: 0.91 mg/dL (ref 0.40–1.50)
GFR: 86.99 mL/min (ref >60.00)
Glucose, Bld: 179 mg/dL — ABNORMAL HIGH (ref 70–99)
Potassium: 4.4 mEq/L (ref 3.5–5.1)
Sodium: 142 mEq/L (ref 135–145)
Total Protein: 7.2 g/dL (ref 6.0–8.3)

## 2015-11-18 LAB — HEMOGLOBIN A1C: HEMOGLOBIN A1C: 8.4 % — AB (ref 4.6–6.5)

## 2015-11-18 NOTE — Assessment & Plan Note (Signed)
S: controlled. No Carvedilol 6.25 mg BID, entresto  BP Readings from Last 3 Encounters:  11/18/15 132/70  08/07/15 120/64  08/06/15 124/70  A/P:Continue current meds:  See each other back in 4 months.

## 2015-11-18 NOTE — Patient Instructions (Addendum)
We reached out to [facility] and they do not have any information from cardiology. Carl Wood is calling cardiology to try to get more information.   I will reach out to heart failure clinic to get a follow up plan and see if they can call you about results  Labs before you leave  4 months

## 2015-11-18 NOTE — Assessment & Plan Note (Signed)
S: reasonable control considering comorbidities with a1c 7.5 or less. No medication A/P: if gets above this would consider metformin but would ask heart failure clinic their opinion first.

## 2015-11-18 NOTE — Progress Notes (Signed)
[name], MD  Subjective:  Carl Wood is a 74 y.o. year old very pleasant male patient who presents for/with See problem oriented charting ROS- no headache or blurry vision, shortness of breath with hills or stairs but no chest pain. No edema.   Past Medical History-  Patient Active Problem List   Diagnosis Date Noted  . Chronic systolic heart failure ([place]) 08/06/2015    Priority: High  . Former smoker 11/15/2014    Priority: High  . Atrial fibrillation ([place]) 12/19/2012    Priority: High  . CVA (cerebral infarction) 12/19/2012    Priority: High  .  ventricular tachycardia-non sustained  02/17/2012    Priority: High  . Cardiomyopathy, ischemic 09/23/2009    Priority: High  . Diabetes mellitus type II, controlled ([place]) 10/16/2006    Priority: High  . CAD (coronary artery disease) 10/16/2006    Priority: High  . PULMONARY EMBOLISM, HX OF 10/16/2006    Priority: High  . Carotid artery stenosis 03/17/2015    Priority: Medium  . Automatic implantable cardiac defibrillator  medtronic 02/17/2012    Priority: Medium  . History of skin cancer 07/05/2007    Priority: Medium  . Hyperlipidemia 10/16/2006    Priority: Medium  . Essential hypertension 10/16/2006    Priority: Medium  . Actinic keratosis 11/15/2014    Priority: Low  . Trigger finger, acquired 03/14/2014    Priority: Low  . Benign neoplasm of colon 12/11/2012    Priority: Low  . PSEUDOGOUT 08/23/2008    Priority: Low  . LBBB (left bundle branch block) 08/06/2015    Medications- reviewed and updated Current Outpatient Prescriptions  Medication Sig Dispense Refill  . atorvastatin (LIPITOR) 80 MG tablet TAKE ONE TABLET BY MOUTH ONCE DAILY AS DIRECTED 30 tablet 3  . carvedilol (COREG) 6.25 MG tablet Take 1 tablet (6.25 mg total) by mouth 2 (two) times daily. 180 tablet 2  . digoxin (LANOXIN) 0.25 MG tablet TAKE ONE-HALF TABLET BY MOUTH ONCE DAILY 45 tablet 1  . NEOMYCIN-POLYMYXIN-HYDROCORTISONE  (CORTISPORIN) 1 % SOLN otic solution Apply 1-2 drops to toe BID after soaking 10 mL 1  . sacubitril-valsartan (ENTRESTO) 24-26 MG Take 1 tablet by mouth 2 (two) times daily. 60 tablet 3  . XARELTO 20 MG TABS tablet TAKE ONE TABLET BY MOUTH ONCE DAILY 30 tablet 5   No current facility-administered medications for this visit.    Objective: BP 132/70 mmHg  Pulse 78  Temp(Src) 98.5 F (36.9 C)  Wt 219 lb (99.338 kg)  SpO2 93% Gen: NAD, resting comfortably CV: RRR no murmurs rubs or gallops Lungs: CTAB no crackles, wheeze, rhonchi Abdomen: soft/nontender/nondistended/normal bowel sounds. No rebound or guarding.  Ext: no edema Skin: warm, dry Neuro: grossly normal, moves all extremities  Assessment/Plan:  Diabetes mellitus type II, controlled ([place]) S: reasonable control considering comorbidities with a1c 7.5 or less. No medication A/P: if gets above this would consider metformin but would ask heart failure clinic their opinion first.    Chronic systolic heart failure ([place]) S: Patient has eben seen in heart failure clinic. He has done well with transition to entresto from losartan. Had CPX test but not sure of results or when he is supposed to follow up with HF clinic. Weight is up some from last visit with me but patient admits to poor activity and overeating with holidays. No edema, shortness of breath A/P: continue current therapy with entresto, digoxin, carvedilol. Reach out to HF clinic to see when they would like  to see patient back and whether they will discuss CPX at visit or call him beforehand.   Also from sleep apnea perspective- Bevelyn Ngo has reached out to Dr. Theodosia Blender CMA as [facility] told us they had no orders for CPAP when we called today.    Essential hypertension S: controlled. No Carvedilol 6.25 mg BID, entresto  BP Readings from Last 3 Encounters:  11/18/15 132/70  08/07/15 120/64  08/06/15 124/70  A/P:Continue current meds:  See each other back in 4 months.   My thought  is that patient with prior smoking history likely needs PFTs. Considered AAA and CT lung cancer screening but with heart history unclear if life expectancy would be 10 years.   4 months Return precautions advised.   Orders Placed This Encounter  Procedures  . Hemoglobin A1c    [place]  . Comprehensive metabolic panel

## 2015-11-18 NOTE — Assessment & Plan Note (Signed)
S: Patient has eben seen in heart failure clinic. He has done well with transition to entresto from losartan. Had CPX test but not sure of results or when he is supposed to follow up with HF clinic. Weight is up some from last visit with me but patient admits to poor activity and overeating with holidays. No edema, shortness of breath A/P: continue current therapy with entresto, digoxin, carvedilol. Reach out to HF clinic to see when they would like to see patient back and whether they will discuss CPX at visit or call him beforehand.   Also from sleep apnea perspective- Bevelyn Ngo has reached out to Dr. Theodosia Blender CMA as [facility] told us they had no orders for CPAP when we called today.

## 2015-11-21 ENCOUNTER — Telehealth: Payer: Self-pay

## 2015-11-21 NOTE — Telephone Encounter (Signed)
Patient was informed of the timing that he would hear back from [facility] (See phone note from 11/07/15).   I have called the patient again today to discuss.  Letting him know that he should be hearing something within the next week from [place].  I let him know if he had any questions he could call my desk directly at [phone].

## 2015-11-21 NOTE — Telephone Encounter (Signed)
Good morning, I tried reaching out to you the other day when the patient came to our office. He states he does not know where he stands regarding his CPAP appointment. He has been trying to get in contact with your office regarding this but is not having any luck. Could you please give patient a call and follow up with him?   Thank You

## 2015-11-26 ENCOUNTER — Telehealth (HOSPITAL_COMMUNITY): Payer: Self-pay | Admitting: Vascular Surgery

## 2015-11-26 NOTE — Telephone Encounter (Signed)
Left pt message to make f/u appt with [place] in Jan

## 2015-12-03 ENCOUNTER — Ambulatory Visit (INDEPENDENT_AMBULATORY_CARE_PROVIDER_SITE_OTHER): Payer: Commercial Managed Care - HMO | Admitting: *Deleted

## 2015-12-03 DIAGNOSIS — I472 Ventricular tachycardia: Secondary | ICD-10-CM | POA: Diagnosis not present

## 2015-12-03 DIAGNOSIS — I4729 Other ventricular tachycardia: Secondary | ICD-10-CM

## 2015-12-03 NOTE — Progress Notes (Signed)
Remote ICD transmission.   

## 2015-12-05 DIAGNOSIS — G4733 Obstructive sleep apnea (adult) (pediatric): Secondary | ICD-10-CM | POA: Diagnosis not present

## 2015-12-09 ENCOUNTER — Telehealth: Payer: Self-pay | Admitting: Internal Medicine

## 2015-12-09 ENCOUNTER — Telehealth: Payer: Self-pay | Admitting: Family Medicine

## 2015-12-09 NOTE — Telephone Encounter (Signed)
LMOVM for pt informing him that his transmission was received on 12-03-15.

## 2015-12-09 NOTE — Telephone Encounter (Signed)
Error

## 2015-12-09 NOTE — Telephone Encounter (Signed)
Use 11:15 on Thursday or Friday (open slot). Whichever one he does not use- open up for SDA

## 2015-12-09 NOTE — Telephone Encounter (Signed)
Patient is requesting to see MD this week to discuss some blood work that was done. Is it ok to use a SDA?

## 2015-12-09 NOTE — Telephone Encounter (Signed)
Ok to use SDA. 

## 2015-12-09 NOTE — Telephone Encounter (Signed)
°  1. Has your device fired? No ° °2. Is you device beeping? no ° °3. Are you experiencing draining or swelling at device site? no ° °4. Are you calling to see if we received your device transmission? yes ° °5. Have you passed out? no ° °

## 2015-12-10 NOTE — Telephone Encounter (Signed)
See below

## 2015-12-12 ENCOUNTER — Encounter: Payer: Self-pay | Admitting: Family Medicine

## 2015-12-12 ENCOUNTER — Ambulatory Visit (INDEPENDENT_AMBULATORY_CARE_PROVIDER_SITE_OTHER): Payer: Commercial Managed Care - HMO | Admitting: Family Medicine

## 2015-12-12 VITALS — BP 122/70 | HR 69 | Temp 97.9°F | Wt 219.0 lb

## 2015-12-12 DIAGNOSIS — E119 Type 2 diabetes mellitus without complications: Secondary | ICD-10-CM | POA: Diagnosis not present

## 2015-12-12 MED ORDER — GLUCOSE BLOOD VI STRP
ORAL_STRIP | Status: DC
Start: 2015-12-12 — End: 2015-12-26

## 2015-12-12 MED ORDER — METFORMIN HCL 500 MG PO TABS: 500.0000 mg | ORAL_TABLET | Freq: Two times a day (BID) | ORAL | Status: DC

## 2015-12-12 NOTE — Patient Instructions (Addendum)
Lab Results  Component Value Date   HGBA1C 8.4* 11/18/2015  Goal a1c is under 7.5. We are going to use metformin to try to get you there.   Start with a 1/2 tab in morning with breakfast for a week Then full tab with breakfast for a week Then full tab in AM and 1/2 tab with dinner for a week Then full tab twice a day (breakfast and dinner)  See you in around 3-4 months to recheck a1c. We may have to go up on dose at that time. Healthy eating is very important for diabetes control.      Metformin tablets What is this medicine? METFORMIN (met FOR min) is used to treat type 2 diabetes. It helps to control blood sugar. Treatment is combined with diet and exercise. This medicine can be used alone or with other medicines for diabetes. This medicine may be used for other purposes; ask your health care provider or pharmacist if you have questions. What should I tell my health care provider before I take this medicine? They need to know if you have any of these conditions: -anemia -frequently drink alcohol-containing beverages -become easily dehydrated -heart attack -heart failure that is treated with medications. We are aware of this-if you start having extra fluid, you can stop the metformin short term.  -kidney disease -liver disease -polycystic ovary syndrome -serious infection or injury -vomiting -an unusual or allergic reaction to metformin, other medicines, foods, dyes, or preservatives -pregnant or trying to get pregnant -breast-feeding How should I use this medicine? Take this medicine by mouth. Take it with meals. Swallow the tablets with a drink of water. Follow the directions on the prescription label. Take your medicine at regular intervals. Do not take your medicine more often than directed. Talk to your pediatrician regarding the use of this medicine in children. While this drug may be prescribed for children as young as 53 years of age for selected conditions, precautions  do apply. Overdosage: If you think you have taken too much of this medicine contact a poison control center or emergency room at once. NOTE: This medicine is only for you. Do not share this medicine with others. What if I miss a dose? If you miss a dose, take it as soon as you can. If it is almost time for your next dose, take only that dose. Do not take double or extra doses. What may interact with this medicine? Do not take this medicine with any of the following medications: -dofetilide -gatifloxacin -certain contrast medicines given before X-rays, CT scans, MRI, or other procedures This medicine may also interact with the following medications: -acetazolamide -certain medicines for HIV infection or hepatitis, like adefovir, emtricitabine, entecavir, lamivudine, or tenofovir -cimetidine -crizotinib -digoxin. i am aware you are on this- ran through interaction checker and no concern noted.  -diuretics -male hormones, like estrogens or progestins and birth control pills -glycopyrrolate -isoniazid -lamotrigine -medicines for blood pressure, heart disease, irregular heart beat -memantine -midodrine -methazolamide -morphine -nicotinic acid -phenothiazines like chlorpromazine, mesoridazine, prochlorperazine, thioridazine -phenytoin -procainamide -propantheline -quinidine -quinine -ranitidine -ranolazine -steroid medicines like prednisone or cortisone -stimulant medicines for attention disorders, weight loss, or to stay awake -thyroid medicines -topiramate -trimethoprim -trospium -vancomycin -vandetanib -zonisamide This list may not describe all possible interactions. Give your health care provider a list of all the medicines, herbs, non-prescription drugs, or dietary supplements you use. Also tell them if you smoke, drink alcohol, or use illegal drugs. Some items may interact with your medicine. What  should I watch for while using this medicine? Visit your doctor or  health care professional for regular checks on your progress. A test called the HbA1C (A1C) will be monitored. This is a simple blood test. It measures your blood sugar control over the last 2 to 3 months. You will receive this test every 3 to 6 months. Learn how to check your blood sugar. Learn the symptoms of low and high blood sugar and how to manage them. Always carry a quick-source of sugar with you in case you have symptoms of low blood sugar. Examples include hard sugar candy or glucose tablets. Make sure others know that you can choke if you eat or drink when you develop serious symptoms of low blood sugar, such as seizures or unconsciousness. They must get medical help at once. Tell your doctor or health care professional if you have high blood sugar. You might need to change the dose of your medicine. If you are sick or exercising more than usual, you might need to change the dose of your medicine. Do not skip meals. Ask your doctor or health care professional if you should avoid alcohol. Many nonprescription cough and cold products contain sugar or alcohol. These can affect blood sugar. This medicine may cause ovulation in premenopausal women who do not have regular monthly periods. This may increase your chances of becoming pregnant. You should not take this medicine if you become pregnant or think you may be pregnant. Talk with your doctor or health care professional about your birth control options while taking this medicine. Contact your doctor or health care professional right away if think you are pregnant. If you are going to need surgery, a MRI, CT scan, or other procedure, tell your doctor that you are taking this medicine. You may need to stop taking this medicine before the procedure. Wear a medical ID bracelet or chain, and carry a card that describes your disease and details of your medicine and dosage times. What side effects may I notice from receiving this medicine? Side effects  that you should report to your doctor or health care professional as soon as possible: -allergic reactions like skin rash, itching or hives, swelling of the face, lips, or tongue -breathing problems -feeling faint or lightheaded, falls -muscle aches or pains -signs and symptoms of low blood sugar such as feeling anxious, confusion, dizziness, increased hunger, unusually weak or tired, sweating, shakiness, cold, irritable, headache, blurred vision, fast heartbeat, loss of consciousness -slow or irregular heartbeat -unusual stomach pain or discomfort -unusually tired or weak Side effects that usually do not require medical attention (report to your doctor or health care professional if they continue or are bothersome): -diarrhea -headache -heartburn -metallic taste in mouth -nausea -stomach gas, upset This list may not describe all possible side effects. Call your doctor for medical advice about side effects. You may report side effects to FDA at 1-800-FDA-1088. Where should I keep my medicine? Keep out of the reach of children. Store at room temperature between 15 and 30 degrees C (59 and 86 degrees F). Protect from moisture and light. Throw away any unused medicine after the expiration date. NOTE: This sheet is a summary. It may not cover all possible information. If you have questions about this medicine, talk to your doctor, pharmacist, or health care provider.    2016, Elsevier/Gold Standard. (2014-05-07 22:14:40)

## 2015-12-12 NOTE — Progress Notes (Signed)
[name], MD  Subjective:  Carl Wood is a 75 y.o. year old very pleasant male patient who presents for/with See problem oriented charting ROS- no hypoglycemia or blurry vision. Baseline shortness of breath unchanged. No headache.   Past Medical History-  Patient Active Problem List   Diagnosis Date Noted  . Chronic systolic heart failure ([place]) 08/06/2015    Priority: High  . Former smoker 11/15/2014    Priority: High  . Atrial fibrillation ([place]) 12/19/2012    Priority: High  . CVA (cerebral infarction) 12/19/2012    Priority: High  .  ventricular tachycardia-non sustained  02/17/2012    Priority: High  . Cardiomyopathy, ischemic 09/23/2009    Priority: High  . Diabetes mellitus type II, controlled ([place]) 10/16/2006    Priority: High  . CAD (coronary artery disease) 10/16/2006    Priority: High  . PULMONARY EMBOLISM, HX OF 10/16/2006    Priority: High  . Carotid artery stenosis 03/17/2015    Priority: Medium  . Automatic implantable cardiac defibrillator  medtronic 02/17/2012    Priority: Medium  . History of skin cancer 07/05/2007    Priority: Medium  . Hyperlipidemia 10/16/2006    Priority: Medium  . Essential hypertension 10/16/2006    Priority: Medium  . Actinic keratosis 11/15/2014    Priority: Low  . Trigger finger, acquired 03/14/2014    Priority: Low  . Benign neoplasm of colon 12/11/2012    Priority: Low  . PSEUDOGOUT 08/23/2008    Priority: Low  . LBBB (left bundle branch block) 08/06/2015    Medications- reviewed and updated Current Outpatient Prescriptions  Medication Sig Dispense Refill  . atorvastatin (LIPITOR) 80 MG tablet TAKE ONE TABLET BY MOUTH ONCE DAILY AS DIRECTED 30 tablet 3  . carvedilol (COREG) 6.25 MG tablet Take 1 tablet (6.25 mg total) by mouth 2 (two) times daily. 180 tablet 2  . digoxin (LANOXIN) 0.25 MG tablet TAKE ONE-HALF TABLET BY MOUTH ONCE DAILY 45 tablet 1  . sacubitril-valsartan (ENTRESTO) 24-26 MG Take 1 tablet by  mouth 2 (two) times daily. 60 tablet 3  . XARELTO 20 MG TABS tablet TAKE ONE TABLET BY MOUTH ONCE DAILY 30 tablet 5  . glucose blood (ONE TOUCH TEST STRIPS) test strip Use to test blood sugars daily. Dx: E11.9 100 each 12  . metFORMIN (GLUCOPHAGE) 500 MG tablet Take 1 tablet (500 mg total) by mouth 2 (two) times daily with a meal. 60 tablet 5  . NEOMYCIN-POLYMYXIN-HYDROCORTISONE (CORTISPORIN) 1 % SOLN otic solution Apply 1-2 drops to toe BID after soaking (Patient not taking: Reported on 12/12/2015) 10 mL 1   No current facility-administered medications for this visit.    Objective: BP 122/70 mmHg  Pulse 69  Temp(Src) 97.9 F (36.6 C)  Wt 219 lb (99.338 kg) Gen: NAD, resting comfortably Assessment/Plan:  Diabetes mellitus type II, controlled ([place]) S: poorly controlled. On no medicine CBGs- around 200-250 after meals Exercise and diet- minimal exercise due to heart disease  Lab Results  Component Value Date   HGBA1C 8.4* 11/18/2015   HGBA1C 7.5* 07/28/2015   HGBA1C 7.4* 07/21/2015   A/P:I had considered not using metformin due to his heart failure history and lactic acidosis risk. Patient has not been hospitalized for this in years though and reconsidered today. We opted to do a slow titration up to 500mg  BID. Kidney function is excellent. Follow up 4 months. Hopeful a1c 7.5 or less. Titrate up further if needed. I did not think patient had meter so  teaching was planned today but he in fact does. We refilled strips and discussed testing in AM to get a trend for improvement. May just do POC a1c at follow up.    3-4 months  Meds ordered this encounter  Medications  . glucose blood (ONE TOUCH TEST STRIPS) test strip    Sig: Use to test blood sugars daily. Dx: E11.9    Dispense:  100 each    Refill:  12  . metFORMIN (GLUCOPHAGE) 500 MG tablet    Sig: Take 1 tablet (500 mg total) by mouth 2 (two) times daily with a meal.    Dispense:  60 tablet    Refill:  5   >50% of 20 minute  office visit was spent on counseling (discussing medication options and risks/ide effects, discussing CBG monitoring) and coordination of care

## 2015-12-13 NOTE — Assessment & Plan Note (Signed)
S: poorly controlled. On no medicine CBGs- around 200-250 after meals Exercise and diet- minimal exercise due to heart disease  Lab Results  Component Value Date   HGBA1C 8.4* 11/18/2015   HGBA1C 7.5* 07/28/2015   HGBA1C 7.4* 07/21/2015   A/P:I had considered not using metformin due to his heart failure history and lactic acidosis risk. Patient has not been hospitalized for this in years though and reconsidered today. We opted to do a slow titration up to 500mg  BID. Kidney function is excellent. Follow up 4 months. Hopeful a1c 7.5 or less. Titrate up further if needed. I did not think patient had meter so teaching was planned today but he in fact does. We refilled strips and discussed testing in AM to get a trend for improvement. May just do POC a1c at follow up.

## 2015-12-22 LAB — CUP PACEART REMOTE DEVICE CHECK
Battery Voltage: 2.75 V
Date Time Interrogation Session: 20161228062407
HIGH POWER IMPEDANCE MEASURED VALUE: 81 Ohm
Implantable Lead Location: 753860
Lead Channel Sensing Intrinsic Amplitude: 10.157 mV
Lead Channel Setting Sensing Sensitivity: 0.3 mV
MDC IDC LEAD IMPLANT DT: 20010723
MDC IDC LEAD MODEL: 6943
MDC IDC MSMT LEADCHNL RV IMPEDANCE VALUE: 488 Ohm
MDC IDC SET LEADCHNL RV PACING AMPLITUDE: 2.5 V
MDC IDC SET LEADCHNL RV PACING PULSEWIDTH: 0.8 ms
MDC IDC STAT BRADY RV PERCENT PACED: 1.29 %

## 2015-12-24 ENCOUNTER — Encounter: Payer: Self-pay | Admitting: Cardiology

## 2015-12-26 ENCOUNTER — Other Ambulatory Visit: Payer: Self-pay | Admitting: Family Medicine

## 2015-12-26 MED ORDER — GLUCOSE BLOOD VI STRP
ORAL_STRIP | Status: DC
Start: 2015-12-26 — End: 2016-02-19

## 2015-12-26 MED ORDER — ACCU-CHEK AVIVA PLUS W/DEVICE KIT: PACK | Status: DC

## 2015-12-26 MED ORDER — ACCU-CHEK SOFT TOUCH LANCETS MISC: Status: DC

## 2015-12-27 ENCOUNTER — Other Ambulatory Visit: Payer: Self-pay | Admitting: Family Medicine

## 2015-12-31 ENCOUNTER — Ambulatory Visit (HOSPITAL_COMMUNITY)
Admission: RE | Admit: 2015-12-31 | Discharge: 2015-12-31 | Disposition: A | Discharge status: Home / Self Care | Payer: Commercial Managed Care - HMO | Source: Ambulatory Visit | Attending: Internal Medicine | Admitting: Internal Medicine

## 2015-12-31 ENCOUNTER — Encounter (HOSPITAL_COMMUNITY): Payer: Self-pay | Admitting: Cardiology

## 2015-12-31 VITALS — BP 100/52 | HR 73 | Wt 216.4 lb

## 2015-12-31 DIAGNOSIS — I739 Peripheral vascular disease, unspecified: Secondary | ICD-10-CM | POA: Insufficient documentation

## 2015-12-31 DIAGNOSIS — I5022 Chronic systolic (congestive) heart failure: Secondary | ICD-10-CM | POA: Diagnosis not present

## 2015-12-31 DIAGNOSIS — Z7984 Long term (current) use of oral hypoglycemic drugs: Secondary | ICD-10-CM | POA: Diagnosis not present

## 2015-12-31 DIAGNOSIS — E119 Type 2 diabetes mellitus without complications: Secondary | ICD-10-CM | POA: Diagnosis not present

## 2015-12-31 DIAGNOSIS — Z87891 Personal history of nicotine dependence: Secondary | ICD-10-CM | POA: Diagnosis not present

## 2015-12-31 DIAGNOSIS — E785 Hyperlipidemia, unspecified: Secondary | ICD-10-CM | POA: Insufficient documentation

## 2015-12-31 DIAGNOSIS — Z79899 Other long term (current) drug therapy: Secondary | ICD-10-CM | POA: Insufficient documentation

## 2015-12-31 DIAGNOSIS — I110 Hypertensive heart disease with heart failure: Secondary | ICD-10-CM | POA: Insufficient documentation

## 2015-12-31 DIAGNOSIS — Z880 Allergy status to penicillin: Secondary | ICD-10-CM | POA: Diagnosis not present

## 2015-12-31 DIAGNOSIS — Z833 Family history of diabetes mellitus: Secondary | ICD-10-CM | POA: Diagnosis not present

## 2015-12-31 DIAGNOSIS — I2510 Atherosclerotic heart disease of native coronary artery without angina pectoris: Secondary | ICD-10-CM | POA: Diagnosis not present

## 2015-12-31 DIAGNOSIS — G4733 Obstructive sleep apnea (adult) (pediatric): Secondary | ICD-10-CM | POA: Diagnosis not present

## 2015-12-31 DIAGNOSIS — I482 Chronic atrial fibrillation: Secondary | ICD-10-CM | POA: Diagnosis not present

## 2015-12-31 DIAGNOSIS — Z7902 Long term (current) use of antithrombotics/antiplatelets: Secondary | ICD-10-CM | POA: Diagnosis not present

## 2015-12-31 DIAGNOSIS — Z882 Allergy status to sulfonamides status: Secondary | ICD-10-CM | POA: Diagnosis not present

## 2015-12-31 DIAGNOSIS — Z8249 Family history of ischemic heart disease and other diseases of the circulatory system: Secondary | ICD-10-CM | POA: Diagnosis not present

## 2015-12-31 NOTE — Progress Notes (Signed)
Advanced Heart Failure Medication Review by a Pharmacist  Does the patient  feel that his/her medications are working for him/her?  yes  Has the patient been experiencing any side effects to the medications prescribed?  no  Does the patient measure his/her own blood pressure or blood glucose at home?  yes   Does the patient have any problems obtaining medications due to transportation or finances?   no  Understanding of regimen: good Understanding of indications: good Potential of compliance: good Patient understands to avoid NSAIDs. Patient understands to avoid decongestants.  Issues to address at subsequent visits: None   Pharmacist comments:  Carl Wood is a pleasant 75 yo M presenting with his wife and with a current medication list. He reports excellent compliance with his regimen and didn't have any specific medication-related questions or concerns for me at this time.   Ruta Hinds. Velva Harman, PharmD, BCPS, CPP Clinical Pharmacist Pager: [phone] Phone: [phone] 12/31/2015 2:52 PM      Time with patient: 8 minutes Preparation and documentation time: 2 minutes Total time: 10 minutes

## 2015-12-31 NOTE — Patient Instructions (Signed)
Your physician has requested that you have an ankle brachial index (ABI). During this test an ultrasound and blood pressure cuff are used to evaluate the arteries that supply the arms and legs with blood. Allow thirty minutes for this exam. There are no restrictions or special instructions.  Your physician has requested that you have a cardiac catheterization. Cardiac catheterization is used to diagnose and/or treat various heart conditions. Doctors may recommend this procedure for a number of different reasons. The most common reason is to evaluate chest pain. Chest pain can be a symptom of coronary artery disease (CAD), and cardiac catheterization can show whether plaque is narrowing or blocking your heart's arteries. This procedure is also used to evaluate the valves, as well as measure the blood flow and oxygen levels in different parts of your heart. For further information please visit [email or web address]. Please follow instruction sheet, as given.  Your physician recommends that you schedule a follow-up appointment in: 6 weeks with Dr. Haroldine Laws

## 2015-12-31 NOTE — Progress Notes (Signed)
Patient ID: Carl Wood, male   DOB: Dec 09, 1940, 75 y.o.   MRN: [id number]    Advanced Heart Failure Clinic Note   Referring Physician: Dr Carl Wood  Primary Care: Dr Carl Wood  Primary Cardiologist: EP: Dr Carl Wood  Neurologists: Dr Carl Wood  HPI: Mr Carl Wood is a 75 year old with a history of MI 1983, ICM, chronic systolic heart failure s/p Medtronic single chamber ICD, CVA 2014, DM , VT, PE, ,htn, permanent A fib on Xarelto, former smoker 2014  referred to the HF clinic by Drs Carl Wood and [facility] for worsening EF and increasing exercise intolerance.   Stated he had not had cath in the last 10 years.  Says as long as he takes his time canwalk without SOB. Does have SOB up a hill. Legs still get very tired with walking and that is major limiting factor. Uses cart in grocery store. Denies CP. Sleeps in a recliner chronically ( 2+ years) but states it is from hip pain, states he does not get SOB when he lies flat. No lightheadedness or dizziness. No ICD shocks.  Has never needed diuretics.  No bleeding problems. Has plan for CPAP initiation 10/2015. Occasionally has left ankle edema. CPX on 9/16 reviewed personally and stopped early due to leg fatigue. No clear cardiac or pulmonary limitation   06/2015 ECHO EF 20% Severely dilated mild TR,  12/2012 ECHO EF 30-35%   07/2015: k 4.0 Creatinine 0.86  11/2015: K 4.4, creatinine 0.91  CPX 08/13/15 Resting HR: 65 Peak HR: 113  (76% age predicted max HR) BP rest: 106/60 BP peak: 130/60 Peak VO2: 14.9 (55.2% predicted peak VO2) VE/VCO2 slope: 27.9 OUES: 2.11 Peak RER: 0.95 Ventilatory Threshold: 11.0 (40.8% predicted or measured peak VO2) Peak RR 28 Peak Ventilation: 41.4 VE/MVV: 55.9% PETCO2 at peak: 37 O2pulse: 12  (71% predicted O2pulse)   Past Medical History  Diagnosis Date  . PE (pulmonary embolism)   . Colon polyps   . CHF (congestive heart failure) ([place])   . CAD (coronary artery disease)   . Diabetes mellitus   . Diverticulosis of  colon   . Hyperlipidemia   . Hypertension   . MI (mitral incompetence)   . Nephrolithiasis   . V-tach ([place])   . NEPHROLITHIASIS 06/26/2008    Qualifier: Diagnosis of  By: Sherlynn Stalls, CMA, [place]    . DIVERTICULOSIS, COLON 10/16/2006    Qualifier: Diagnosis of  By: Leanne Chang MD, [place]      Current Outpatient Prescriptions  Medication Sig Dispense Refill  . atorvastatin (LIPITOR) 80 MG tablet TAKE ONE TABLET BY MOUTH ONCE DAILY AS DIRECTED 30 tablet 5  . Blood Glucose Monitoring Suppl (ACCU-CHEK AVIVA PLUS) w/Device KIT Need to used 1 to 2 times a day 1 kit 0  . carvedilol (COREG) 6.25 MG tablet Take 1 tablet (6.25 mg total) by mouth 2 (two) times daily. 180 tablet 2  . digoxin (LANOXIN) 0.25 MG tablet TAKE ONE-HALF TABLET BY MOUTH ONCE DAILY 45 tablet 1  . glucose blood (ACCU-CHEK AVIVA) test strip Use as instructed 100 each 12  . Lancets (ACCU-CHEK SOFT TOUCH) lancets Use as instructed 100 each 12  . metFORMIN (GLUCOPHAGE) 500 MG tablet Take 1 tablet (500 mg total) by mouth 2 (two) times daily with a meal. 60 tablet 5  . sacubitril-valsartan (ENTRESTO) 24-26 MG Take 1 tablet by mouth 2 (two) times daily. 60 tablet 3  . XARELTO 20 MG TABS tablet TAKE ONE TABLET BY MOUTH ONCE DAILY 30 tablet 5  No current facility-administered medications for this encounter.    Allergies  Allergen Reactions  . Penicillins Other (See Comments)    Patient passed out  . Sulfamethoxazole Rash      Social History   Social History  . Marital Status: Married    Spouse Name: Carl Wood  . Number of Children: 3  . Years of Education: 12   Occupational History  . Retired    Social History Main Topics  . Smoking status: Former Smoker -- 0.75 packs/day for 50 years    Types: Cigarettes    Quit date: 12/15/2012  . Smokeless tobacco: Never Used  . Alcohol Use: No  . Drug Use: No  . Sexual Activity: Not on file   Other Topics Concern  . Not on file   Social History Narrative   Married. 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 Colon cancer Father   . Diabetes Father   . Heart disease Father   . Bladder Cancer Mother   . Heart disease Mother     Carl Wood:   12/31/15 1405  BP: 100/52  Pulse: 73  Weight: 216 lb 6.4 oz (98.158 kg)  SpO2: 94%    PHYSICAL EXAM: General:  Elderly appearing. NAD HEENT: normal Neck: supple. no JVD. Carotids 2+ bilat; no bruits. No thyromegaly or nodule noted. Cor: PMI nondisplaced. Irregularly irregular. No M/G/R appreciated. R sided ICD (previous lead fx) Lungs: Coarse lung sounds, otherwise clear. Normal effort Abdomen: soft, NT, ND, no HSM. No bruits or masses. +BS. Back: Previous surgical scar Extremities: no cyanosis, clubbing, rash, Trace ankle edema, Faint L DP pulse, Absent R DP pulse. No hair below midcalf bilaterally. 2+ femoral pulse R side, 1-2+ L side. No bruits Neuro: alert & oriented x 3, cranial nerves grossly intact. moves all 4 extremities w/o difficulty. Affect pleasant.   ASSESSMENT & PLAN:  1. Chronic Systolic HF- ICM Medtronic ICD. NYHA II-III. ECHO 06/2015 LVEF 20%, Mild MR decreased from 30-35% in 2014 - Volume status stable. NYHA II symptoms, limited more by leg weakness/?claudication - Continue carvedilol 6.25 mg BID and digoxin 0.125 mg - Continue 24-26 mg entresto BID. Will not go up with soft BP today. - CPX submaximal, but does not seem to be limited by heart or lungs.  - Will schedule for [facility] with radial/brachial approach for assessment for worsening CAD as reason for drop of EF. 2. OSA - Now using CPAP nightly. Has a nasal mask and hates it.  Encouraged to seek mask change 3. Former smoker  - quit 2014 - Increased risk for PAD 4. Chronic A fib  - Rate controlled  continue current dose of bb  - Continue Xarelto 20 mg daily.  5. ?PAD - Claudication like symptoms and changes consistent with diminished arterial flow with decreased pulses in bilateral LEs. - Will get ABIs.  - Encouraged to exercise  ABIs and R/LHC in coming weeks.  Will follow up in office in 6 weeks.  Legrand Como "Jonni Sanger" [place], PA-C 12/31/2015   Patient seen and examined with Oda Kilts, PA-C. We discussed all aspects of the encounter. I agree with the assessment and plan as stated above.   I have reviewed CPX test personally. Based  on CPX test and history I think major limiting factor is LE claudication. Will order ABIs.   Given progressive LV dysfunction, I do think it is also appropriate to proceed with R/L heart cath. We discussed cath details.   Continue Xarelto for AF  Total time spent 45 minutes. Over half that time spent discussing above.   Vandy Tsuchiya,MD 11:02 PM

## 2016-01-01 ENCOUNTER — Other Ambulatory Visit (HOSPITAL_COMMUNITY): Payer: Self-pay | Admitting: *Deleted

## 2016-01-01 DIAGNOSIS — I5022 Chronic systolic (congestive) heart failure: Secondary | ICD-10-CM

## 2016-01-02 ENCOUNTER — Other Ambulatory Visit (HOSPITAL_COMMUNITY): Payer: Self-pay | Admitting: Internal Medicine

## 2016-01-02 DIAGNOSIS — I2510 Atherosclerotic heart disease of native coronary artery without angina pectoris: Secondary | ICD-10-CM

## 2016-01-05 ENCOUNTER — Telehealth (HOSPITAL_COMMUNITY): Payer: Self-pay | Admitting: *Deleted

## 2016-01-05 DIAGNOSIS — G4733 Obstructive sleep apnea (adult) (pediatric): Secondary | ICD-10-CM | POA: Diagnosis not present

## 2016-01-05 NOTE — Telephone Encounter (Signed)
Pre cert approval # [id number] Valid through 01/20/16-07/20/16

## 2016-01-06 ENCOUNTER — Ambulatory Visit (HOSPITAL_COMMUNITY)
Admission: RE | Admit: 2016-01-06 | Discharge: 2016-01-06 | Disposition: A | Discharge status: Home / Self Care | Payer: Commercial Managed Care - HMO | Source: Ambulatory Visit | Attending: Internal Medicine | Admitting: Internal Medicine

## 2016-01-06 DIAGNOSIS — I10 Essential (primary) hypertension: Secondary | ICD-10-CM | POA: Insufficient documentation

## 2016-01-06 DIAGNOSIS — I2510 Atherosclerotic heart disease of native coronary artery without angina pectoris: Secondary | ICD-10-CM | POA: Diagnosis not present

## 2016-01-06 DIAGNOSIS — E119 Type 2 diabetes mellitus without complications: Secondary | ICD-10-CM | POA: Insufficient documentation

## 2016-01-06 DIAGNOSIS — E785 Hyperlipidemia, unspecified: Secondary | ICD-10-CM | POA: Diagnosis not present

## 2016-01-06 DIAGNOSIS — R0989 Other specified symptoms and signs involving the circulatory and respiratory systems: Secondary | ICD-10-CM | POA: Diagnosis not present

## 2016-01-12 ENCOUNTER — Other Ambulatory Visit: Payer: Self-pay

## 2016-01-12 MED ORDER — GLUCOSE BLOOD VI STRP: ORAL_STRIP | Status: DC

## 2016-01-20 ENCOUNTER — Encounter (HOSPITAL_COMMUNITY): Payer: Self-pay | Admitting: Internal Medicine

## 2016-01-20 ENCOUNTER — Ambulatory Visit (HOSPITAL_COMMUNITY)
Admission: RE | Admit: 2016-01-20 | Discharge: 2016-01-20 | Disposition: A | Discharge status: Home / Self Care | Payer: Commercial Managed Care - HMO | Source: Ambulatory Visit | Attending: Internal Medicine | Admitting: Internal Medicine

## 2016-01-20 ENCOUNTER — Encounter (HOSPITAL_COMMUNITY)
Admission: RE | Disposition: A | Discharge status: Home / Self Care | Payer: Self-pay | Source: Ambulatory Visit | Attending: Internal Medicine

## 2016-01-20 DIAGNOSIS — Z86711 Personal history of pulmonary embolism: Secondary | ICD-10-CM | POA: Insufficient documentation

## 2016-01-20 DIAGNOSIS — E785 Hyperlipidemia, unspecified: Secondary | ICD-10-CM | POA: Insufficient documentation

## 2016-01-20 DIAGNOSIS — I255 Ischemic cardiomyopathy: Secondary | ICD-10-CM | POA: Insufficient documentation

## 2016-01-20 DIAGNOSIS — I252 Old myocardial infarction: Secondary | ICD-10-CM | POA: Insufficient documentation

## 2016-01-20 DIAGNOSIS — Z882 Allergy status to sulfonamides status: Secondary | ICD-10-CM | POA: Diagnosis not present

## 2016-01-20 DIAGNOSIS — E119 Type 2 diabetes mellitus without complications: Secondary | ICD-10-CM | POA: Insufficient documentation

## 2016-01-20 DIAGNOSIS — Z7984 Long term (current) use of oral hypoglycemic drugs: Secondary | ICD-10-CM | POA: Diagnosis not present

## 2016-01-20 DIAGNOSIS — Z880 Allergy status to penicillin: Secondary | ICD-10-CM | POA: Diagnosis not present

## 2016-01-20 DIAGNOSIS — Z8249 Family history of ischemic heart disease and other diseases of the circulatory system: Secondary | ICD-10-CM | POA: Diagnosis not present

## 2016-01-20 DIAGNOSIS — Z7901 Long term (current) use of anticoagulants: Secondary | ICD-10-CM | POA: Diagnosis not present

## 2016-01-20 DIAGNOSIS — I340 Nonrheumatic mitral (valve) insufficiency: Secondary | ICD-10-CM | POA: Diagnosis not present

## 2016-01-20 DIAGNOSIS — Z87442 Personal history of urinary calculi: Secondary | ICD-10-CM | POA: Diagnosis not present

## 2016-01-20 DIAGNOSIS — Z95810 Presence of automatic (implantable) cardiac defibrillator: Secondary | ICD-10-CM | POA: Diagnosis not present

## 2016-01-20 DIAGNOSIS — I2510 Atherosclerotic heart disease of native coronary artery without angina pectoris: Secondary | ICD-10-CM | POA: Diagnosis not present

## 2016-01-20 DIAGNOSIS — I482 Chronic atrial fibrillation: Secondary | ICD-10-CM | POA: Insufficient documentation

## 2016-01-20 DIAGNOSIS — Z86010 Personal history of colonic polyps: Secondary | ICD-10-CM | POA: Diagnosis not present

## 2016-01-20 DIAGNOSIS — I110 Hypertensive heart disease with heart failure: Secondary | ICD-10-CM | POA: Diagnosis not present

## 2016-01-20 DIAGNOSIS — I5022 Chronic systolic (congestive) heart failure: Secondary | ICD-10-CM

## 2016-01-20 DIAGNOSIS — G4733 Obstructive sleep apnea (adult) (pediatric): Secondary | ICD-10-CM | POA: Diagnosis not present

## 2016-01-20 DIAGNOSIS — K5730 Diverticulosis of large intestine without perforation or abscess without bleeding: Secondary | ICD-10-CM | POA: Insufficient documentation

## 2016-01-20 DIAGNOSIS — Z87891 Personal history of nicotine dependence: Secondary | ICD-10-CM | POA: Insufficient documentation

## 2016-01-20 HISTORY — PX: CARDIAC CATHETERIZATION: SHX172

## 2016-01-20 LAB — POCT I-STAT 3, VENOUS BLOOD GAS (G3P V)
Acid-Base Excess: 2 mmol/L (ref 0.0–2.0)
BICARBONATE: 26.5 meq/L — AB (ref 20.0–24.0)
Bicarbonate: 27.3 mEq/L — ABNORMAL HIGH (ref 20.0–24.0)
O2 Saturation: 59 %
O2 Saturation: 64 %
PCO2 VEN: 44.9 mmHg — AB (ref 45.0–50.0)
PCO2 VEN: 46.8 mmHg (ref 45.0–50.0)
PH VEN: 7.361 — AB (ref 7.250–7.300)
PO2 VEN: 32 mmHg (ref 30.0–45.0)
PO2 VEN: 34 mmHg (ref 30.0–45.0)
TCO2: 28 mmol/L (ref 0–100)
TCO2: 29 mmol/L (ref 0–100)
pH, Ven: 7.393 — ABNORMAL HIGH (ref 7.250–7.300)

## 2016-01-20 LAB — POCT ACTIVATED CLOTTING TIME
ACTIVATED CLOTTING TIME: 296 s
ACTIVATED CLOTTING TIME: 183 s
ACTIVATED CLOTTING TIME: 214 s
Activated Clotting Time: 250 seconds

## 2016-01-20 LAB — BASIC METABOLIC PANEL
ANION GAP: 11 (ref 5–15)
BUN: 13 mg/dL (ref 6–20)
CALCIUM: 9.3 mg/dL (ref 8.9–10.3)
CO2: 27 mmol/L (ref 22–32)
CREATININE: 0.98 mg/dL (ref 0.61–1.24)
Chloride: 102 mmol/L (ref 101–111)
GFR calc non Af Amer: >60 mL/min (ref >60)
Glucose, Bld: 143 mg/dL — ABNORMAL HIGH (ref 65–99)
Potassium: 4.5 mmol/L (ref 3.5–5.1)
Sodium: 140 mmol/L (ref 135–145)

## 2016-01-20 LAB — CBC
HCT: 42.6 % (ref 39.0–52.0)
HEMOGLOBIN: 14.4 g/dL (ref 13.0–17.0)
MCH: 33.3 pg (ref 26.0–34.0)
MCHC: 33.8 g/dL (ref 30.0–36.0)
MCV: 98.4 fL (ref 78.0–100.0)
Platelets: 149 10*3/uL — ABNORMAL LOW (ref 150–400)
RBC: 4.33 MIL/uL (ref 4.22–5.81)
RDW: 13.2 % (ref 11.5–15.5)
WBC: 5 10*3/uL (ref 4.0–10.5)

## 2016-01-20 LAB — POCT I-STAT 3, ART BLOOD GAS (G3+)
Acid-base deficit: 1 mmol/L (ref 0.0–2.0)
Bicarbonate: 24.4 mEq/L — ABNORMAL HIGH (ref 20.0–24.0)
O2 Saturation: 95 %
PCO2 ART: 40.6 mmHg (ref 35.0–45.0)
PH ART: 7.386 (ref 7.350–7.450)
PO2 ART: 74 mmHg — AB (ref 80.0–100.0)
TCO2: 26 mmol/L (ref 0–100)

## 2016-01-20 LAB — GLUCOSE, CAPILLARY
GLUCOSE-CAPILLARY: 121 mg/dL — AB (ref 65–99)
Glucose-Capillary: 126 mg/dL — ABNORMAL HIGH (ref 65–99)

## 2016-01-20 SURGERY — RIGHT/LEFT HEART CATH AND CORONARY ANGIOGRAPHY

## 2016-01-20 MED ORDER — FENTANYL CITRATE (PF) 100 MCG/2ML IJ SOLN
INTRAMUSCULAR | Status: AC
  Filled 2016-01-20: qty 2

## 2016-01-20 MED ORDER — SODIUM CHLORIDE 0.9 % IV SOLN: 250.0000 mL | INTRAVENOUS | Status: DC | PRN

## 2016-01-20 MED ORDER — SODIUM CHLORIDE 0.9% FLUSH: 3.0000 mL | INTRAVENOUS | Status: DC | PRN

## 2016-01-20 MED ORDER — HEPARIN (PORCINE) IN NACL 2-0.9 UNIT/ML-% IJ SOLN
INTRAMUSCULAR | Status: AC
  Filled 2016-01-20: qty 1000

## 2016-01-20 MED ORDER — SODIUM CHLORIDE 0.9% FLUSH: 3.0000 mL | Freq: Two times a day (BID) | INTRAVENOUS | Status: DC

## 2016-01-20 MED ORDER — SODIUM CHLORIDE 0.9 % IV SOLN
INTRAVENOUS | Status: AC
Start: 2016-01-20 — End: 2016-01-20

## 2016-01-20 MED ORDER — SODIUM CHLORIDE 0.9 % IV SOLN
INTRAVENOUS | Status: DC
  Administered 2016-01-20: 07:00:00 via INTRAVENOUS

## 2016-01-20 MED ORDER — ACETAMINOPHEN 325 MG PO TABS
ORAL_TABLET | ORAL | Status: AC
  Filled 2016-01-20: qty 2

## 2016-01-20 MED ORDER — ONDANSETRON HCL 4 MG/2ML IJ SOLN: 4.0000 mg | Freq: Four times a day (QID) | INTRAMUSCULAR | Status: DC | PRN

## 2016-01-20 MED ORDER — FENTANYL CITRATE (PF) 100 MCG/2ML IJ SOLN
INTRAMUSCULAR | Status: DC | PRN
  Administered 2016-01-20: 25 ug via INTRAVENOUS

## 2016-01-20 MED ORDER — VERAPAMIL HCL 2.5 MG/ML IV SOLN
INTRAVENOUS | Status: DC | PRN
  Administered 2016-01-20: 10 mL via INTRA_ARTERIAL

## 2016-01-20 MED ORDER — LIDOCAINE HCL (PF) 1 % IJ SOLN
INTRAMUSCULAR | Status: AC
  Filled 2016-01-20: qty 30

## 2016-01-20 MED ORDER — ACETAMINOPHEN 325 MG PO TABS
650.0000 mg | ORAL_TABLET | ORAL | Status: DC | PRN
  Administered 2016-01-20: 650 mg via ORAL

## 2016-01-20 MED ORDER — ASPIRIN 81 MG PO CHEW
81.0000 mg | CHEWABLE_TABLET | ORAL | Status: AC
  Administered 2016-01-20: 81 mg via ORAL

## 2016-01-20 MED ORDER — MIDAZOLAM HCL 2 MG/2ML IJ SOLN
INTRAMUSCULAR | Status: DC | PRN
  Administered 2016-01-20: 1 mg via INTRAVENOUS

## 2016-01-20 MED ORDER — LIDOCAINE HCL (PF) 1 % IJ SOLN
INTRAMUSCULAR | Status: DC | PRN
  Administered 2016-01-20: 15 mL via SUBCUTANEOUS

## 2016-01-20 MED ORDER — MIDAZOLAM HCL 2 MG/2ML IJ SOLN
INTRAMUSCULAR | Status: AC
  Filled 2016-01-20: qty 2

## 2016-01-20 MED ORDER — ASPIRIN 81 MG PO CHEW
CHEWABLE_TABLET | ORAL | Status: AC
  Administered 2016-01-20: 81 mg via ORAL
  Filled 2016-01-20: qty 1

## 2016-01-20 MED ORDER — HEPARIN SODIUM (PORCINE) 1000 UNIT/ML IJ SOLN
INTRAMUSCULAR | Status: DC | PRN
  Administered 2016-01-20 (×2): 5000 [IU] via INTRAVENOUS

## 2016-01-20 MED ORDER — IOHEXOL 350 MG/ML SOLN
INTRAVENOUS | Status: DC | PRN
  Administered 2016-01-20: 80 mL via INTRA_ARTERIAL

## 2016-01-20 MED ORDER — VERAPAMIL HCL 2.5 MG/ML IV SOLN
INTRAVENOUS | Status: AC
  Filled 2016-01-20: qty 2

## 2016-01-20 MED ORDER — HEPARIN (PORCINE) IN NACL 2-0.9 UNIT/ML-% IJ SOLN
INTRAMUSCULAR | Status: DC | PRN
  Administered 2016-01-20: 1000 mL via INTRA_ARTERIAL

## 2016-01-20 MED ORDER — HEPARIN SODIUM (PORCINE) 1000 UNIT/ML IJ SOLN
INTRAMUSCULAR | Status: AC
  Filled 2016-01-20: qty 1

## 2016-01-20 SURGICAL SUPPLY — 19 items
BALLN MINITREK RX 2.0X8 (BALLOONS)
BALLOON MINITREK RX 2.0X8 (BALLOONS) IMPLANT
CATH BALLN WEDGE 5F 110CM (CATHETERS) ×2 IMPLANT
CATH INFINITI 5 FR JL3.5 (CATHETERS) ×2 IMPLANT
CATH INFINITI 5FR ANG PIGTAIL (CATHETERS) ×2 IMPLANT
CATH INFINITI JR4 5F (CATHETERS) ×2 IMPLANT
CATH VISTA GUIDE 6FR XBLAD3.5 (CATHETERS) IMPLANT
DEVICE RAD COMP TR BAND LRG (VASCULAR PRODUCTS) ×2 IMPLANT
GLIDESHEATH SLEND SS 6F .021 (SHEATH) ×2 IMPLANT
KIT ENCORE 26 ADVANTAGE (KITS) IMPLANT
KIT HEART LEFT (KITS) ×2 IMPLANT
PACK CARDIAC CATHETERIZATION (CUSTOM PROCEDURE TRAY) ×2 IMPLANT
SHEATH FAST CATH BRACH 5F 5CM (SHEATH) ×2 IMPLANT
SYR MEDRAD MARK V 150ML (SYRINGE) ×2 IMPLANT
TRANSDUCER W/STOPCOCK (MISCELLANEOUS) ×2 IMPLANT
TUBING CIL FLEX 10 FLL-RA (TUBING) ×2 IMPLANT
WIRE COUGAR XT STRL 190CM (WIRE) IMPLANT
WIRE HI TORQ VERSACORE-J 145CM (WIRE) ×2 IMPLANT
WIRE SAFE-T 1.5MM-J .035X260CM (WIRE) ×2 IMPLANT

## 2016-01-20 NOTE — Discharge Instructions (Signed)
Do not restart Metformin until Friday 01-23-16   Radial Site Care Refer to this sheet in the next few weeks. These instructions provide you with information about caring for yourself after your procedure. Your health care provider may also give you more specific instructions. Your treatment has been planned according to current medical practices, but problems sometimes occur. Call your health care provider if you have any problems or questions after your procedure. WHAT TO EXPECT AFTER THE PROCEDURE After your procedure, it is typical to have the following:  Bruising at the radial site that usually fades within 1-2 weeks.  Blood collecting in the tissue (hematoma) that may be painful to the touch. It should usually decrease in size and tenderness within 1-2 weeks. HOME CARE INSTRUCTIONS  Take medicines only as directed by your health care provider.  You may shower 24-48 hours after the procedure or as directed by your health care provider. Remove the bandage (dressing) and gently wash the site with plain soap and water. Pat the area dry with a clean towel. Do not rub the site, because this may cause bleeding.  Do not take baths, swim, or use a hot tub until your health care provider approves.  Check your insertion site every day for redness, swelling, or drainage.  Do not apply powder or lotion to the site.  Do not flex or bend the affected arm for 24 hours or as directed by your health care provider.  Do not push or pull heavy objects with the affected arm for 24 hours or as directed by your health care provider.  Do not lift over 10 lb (4.5 kg) for 5 days after your procedure or as directed by your health care provider.  Ask your health care provider when it is okay to:  Return to work or school.  Resume usual physical activities or sports.  Resume sexual activity.  Do not drive home if you are discharged the same day as the procedure. Have someone else drive you.  You may  drive 24 hours after the procedure unless otherwise instructed by your health care provider.  Do not operate machinery or power tools for 24 hours after the procedure.  If your procedure was done as an outpatient procedure, which means that you went home the same day as your procedure, a responsible adult should be with you for the first 24 hours after you arrive home.  Keep all follow-up visits as directed by your health care provider. This is important. SEEK MEDICAL CARE IF:  You have a fever.  You have chills.  You have increased bleeding from the radial site. Hold pressure on the site. SEEK IMMEDIATE MEDICAL CARE IF:  You have unusual pain at the radial site.  You have redness, warmth, or swelling at the radial site.  You have drainage (other than a small amount of blood on the dressing) from the radial site.  The radial site is bleeding, and the bleeding does not stop after 30 minutes of holding steady pressure on the site.  Your arm or hand becomes pale, cool, tingly, or numb.   This information is not intended to replace advice given to you by your health care provider. Make sure you discuss any questions you have with your health care provider.   Document Released: 12/25/2010 Document Revised: 12/13/2014 Document Reviewed: 06/10/2014 Elsevier Interactive Patient Education [employer].

## 2016-01-20 NOTE — Progress Notes (Signed)
Small hematoma noted right radial and pressure held right radial for 20 min and Vin, PA notified and in to check right radial site and ok to d/c home

## 2016-01-20 NOTE — Progress Notes (Signed)
Site area: rt ac venous sheath Site Prior to Removal:  Level 0 Pressure Applied For:  20 minutes  Manual:   yes Patient Status During Pull:  Stable   Post Pull Site:  Level   0 Post Pull Instructions Given:  yes Post Pull Pulses Present: yes Dressing Applied:  Small tegaderm Bedrest begins @  1155 Comments:

## 2016-01-20 NOTE — Interval H&P Note (Signed)
History and Physical Interval Note:  01/20/2016 7:57 AM  Carl Wood  has presented today for surgery, with the diagnosis of hf  The various methods of treatment have been discussed with the patient and family. After consideration of risks, benefits and other options for treatment, the patient has consented to  Procedure(s): Right/Left Heart Cath and Coronary Angiography (N/A) and possible coronary angioplasty as a surgical intervention .  The patient's history has been reviewed, patient examined, no change in status, stable for surgery.  I have reviewed the patient's chart and labs.  Questions were answered to the patient's satisfaction.     [name], Quillian Quince

## 2016-01-20 NOTE — H&P (View-Only) (Signed)
Patient ID: Carl Wood, male   DOB: Dec 09, 1940, 75 y.o.   MRN: [id number]    Advanced Heart Failure Clinic Note   Referring Physician: Dr Yong Channel  Primary Care: Dr Yong Channel  Primary Cardiologist: EP: Dr Caryl Comes  Neurologists: Dr Leonie Man  HPI: Mr Carl Wood is a 75 year old with a history of MI 1983, ICM, chronic systolic heart failure s/p Medtronic single chamber ICD, CVA 2014, DM , VT, PE, ,htn, permanent A fib on Xarelto, former smoker 2014  referred to the HF clinic by Drs Caryl Comes and [facility] for worsening EF and increasing exercise intolerance.   Stated he had not had cath in the last 10 years.  Says as long as he takes his time canwalk without SOB. Does have SOB up a hill. Legs still get very tired with walking and that is major limiting factor. Uses cart in grocery store. Denies CP. Sleeps in a recliner chronically ( 2+ years) but states it is from hip pain, states he does not get SOB when he lies flat. No lightheadedness or dizziness. No ICD shocks.  Has never needed diuretics.  No bleeding problems. Has plan for CPAP initiation 10/2015. Occasionally has left ankle edema. CPX on 9/16 reviewed personally and stopped early due to leg fatigue. No clear cardiac or pulmonary limitation   06/2015 ECHO EF 20% Severely dilated mild TR,  12/2012 ECHO EF 30-35%   07/2015: k 4.0 Creatinine 0.86  11/2015: K 4.4, creatinine 0.91  CPX 08/13/15 Resting HR: 65 Peak HR: 113  (76% age predicted max HR) BP rest: 106/60 BP peak: 130/60 Peak VO2: 14.9 (55.2% predicted peak VO2) VE/VCO2 slope: 27.9 OUES: 2.11 Peak RER: 0.95 Ventilatory Threshold: 11.0 (40.8% predicted or measured peak VO2) Peak RR 28 Peak Ventilation: 41.4 VE/MVV: 55.9% PETCO2 at peak: 37 O2pulse: 12  (71% predicted O2pulse)   Past Medical History  Diagnosis Date  . PE (pulmonary embolism)   . Colon polyps   . CHF (congestive heart failure) ([place])   . CAD (coronary artery disease)   . Diabetes mellitus   . Diverticulosis of  colon   . Hyperlipidemia   . Hypertension   . MI (mitral incompetence)   . Nephrolithiasis   . V-tach ([place])   . NEPHROLITHIASIS 06/26/2008    Qualifier: Diagnosis of  By: Sherlynn Stalls, CMA, [place]    . DIVERTICULOSIS, COLON 10/16/2006    Qualifier: Diagnosis of  By: Leanne Chang MD, [place]      Current Outpatient Prescriptions  Medication Sig Dispense Refill  . atorvastatin (LIPITOR) 80 MG tablet TAKE ONE TABLET BY MOUTH ONCE DAILY AS DIRECTED 30 tablet 5  . Blood Glucose Monitoring Suppl (ACCU-CHEK AVIVA PLUS) w/Device KIT Need to used 1 to 2 times a day 1 kit 0  . carvedilol (COREG) 6.25 MG tablet Take 1 tablet (6.25 mg total) by mouth 2 (two) times daily. 180 tablet 2  . digoxin (LANOXIN) 0.25 MG tablet TAKE ONE-HALF TABLET BY MOUTH ONCE DAILY 45 tablet 1  . glucose blood (ACCU-CHEK AVIVA) test strip Use as instructed 100 each 12  . Lancets (ACCU-CHEK SOFT TOUCH) lancets Use as instructed 100 each 12  . metFORMIN (GLUCOPHAGE) 500 MG tablet Take 1 tablet (500 mg total) by mouth 2 (two) times daily with a meal. 60 tablet 5  . sacubitril-valsartan (ENTRESTO) 24-26 MG Take 1 tablet by mouth 2 (two) times daily. 60 tablet 3  . XARELTO 20 MG TABS tablet TAKE ONE TABLET BY MOUTH ONCE DAILY 30 tablet 5  No current facility-administered medications for this encounter.    Allergies  Allergen Reactions  . Penicillins Other (See Comments)    Patient passed out  . Sulfamethoxazole Rash      Social History   Social History  . Marital Status: Married    Spouse Name: Carl Wood  . Number of Children: 3  . Years of Education: 12   Occupational History  . Retired    Social History Main Topics  . Smoking status: Former Smoker -- 0.75 packs/day for 50 years    Types: Cigarettes    Quit date: 12/15/2012  . Smokeless tobacco: Never Used  . Alcohol Use: No  . Drug Use: No  . Sexual Activity: Not on file   Other Topics Concern  . Not on file   Social History Narrative   Married. 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 Colon cancer Father   . Diabetes Father   . Heart disease Father   . Bladder Cancer Mother   . Heart disease Mother     [name] Vitals:   12/31/15 1405  BP: 100/52  Pulse: 73  Weight: 216 lb 6.4 oz (98.158 kg)  SpO2: 94%    PHYSICAL EXAM: General:  Elderly appearing. NAD HEENT: normal Neck: supple. no JVD. Carotids 2+ bilat; no bruits. No thyromegaly or nodule noted. Cor: PMI nondisplaced. Irregularly irregular. No M/G/R appreciated. R sided ICD (previous lead fx) Lungs: Coarse lung sounds, otherwise clear. Normal effort Abdomen: soft, NT, ND, no HSM. No bruits or masses. +BS. Back: Previous surgical scar Extremities: no cyanosis, clubbing, rash, Trace ankle edema, Faint L DP pulse, Absent R DP pulse. No hair below midcalf bilaterally. 2+ femoral pulse R side, 1-2+ L side. No bruits Neuro: alert & oriented x 3, cranial nerves grossly intact. moves all 4 extremities w/o difficulty. Affect pleasant.   ASSESSMENT & PLAN:  1. Chronic Systolic HF- ICM Medtronic ICD. NYHA II-III. ECHO 06/2015 LVEF 20%, Mild MR decreased from 30-35% in 2014 - Volume status stable. NYHA II symptoms, limited more by leg weakness/?claudication - Continue carvedilol 6.25 mg BID and digoxin 0.125 mg - Continue 24-26 mg entresto BID. Will not go up with soft BP today. - CPX submaximal, but does not seem to be limited by heart or lungs.  - Will schedule for [facility] with radial/brachial approach for assessment for worsening CAD as reason for drop of EF. 2. OSA - Now using CPAP nightly. Has a nasal mask and hates it.  Encouraged to seek mask change 3. Former smoker  - quit 2014 - Increased risk for PAD 4. Chronic A fib  - Rate controlled  continue current dose of bb  - Continue Xarelto 20 mg daily.  5. ?PAD - Claudication like symptoms and changes consistent with diminished arterial flow with decreased pulses in bilateral LEs. - Will get ABIs.  - Encouraged to exercise  ABIs and R/LHC in coming weeks.  Will follow up in office in 6 weeks.  Legrand Como "Jonni Sanger" [place], PA-C 12/31/2015   Patient seen and examined with Oda Kilts, PA-C. We discussed all aspects of the encounter. I agree with the assessment and plan as stated above.   I have reviewed CPX test personally. Based  on CPX test and history I think major limiting factor is LE claudication. Will order ABIs.   Given progressive LV dysfunction, I do think it is also appropriate to proceed with R/L heart cath. We discussed cath details.   Continue Xarelto for AF  Total time spent 45 minutes. Over half that time spent discussing above.   Rohini Jaroszewski,MD 11:02 PM

## 2016-01-26 ENCOUNTER — Other Ambulatory Visit (HOSPITAL_COMMUNITY): Payer: Self-pay | Admitting: Internal Medicine

## 2016-01-30 DIAGNOSIS — G4733 Obstructive sleep apnea (adult) (pediatric): Secondary | ICD-10-CM | POA: Diagnosis not present

## 2016-02-03 DIAGNOSIS — G4733 Obstructive sleep apnea (adult) (pediatric): Secondary | ICD-10-CM | POA: Diagnosis not present

## 2016-02-11 ENCOUNTER — Encounter (HOSPITAL_COMMUNITY): Payer: Self-pay | Admitting: Internal Medicine

## 2016-02-11 ENCOUNTER — Ambulatory Visit (HOSPITAL_COMMUNITY)
Admission: RE | Admit: 2016-02-11 | Discharge: 2016-02-11 | Disposition: A | Discharge status: Home / Self Care | Payer: Commercial Managed Care - HMO | Source: Ambulatory Visit | Attending: Internal Medicine | Admitting: Internal Medicine

## 2016-02-11 VITALS — BP 107/70 | HR 70 | Resp 18 | Wt 220.5 lb

## 2016-02-11 DIAGNOSIS — Z79899 Other long term (current) drug therapy: Secondary | ICD-10-CM | POA: Insufficient documentation

## 2016-02-11 DIAGNOSIS — Z7984 Long term (current) use of oral hypoglycemic drugs: Secondary | ICD-10-CM | POA: Insufficient documentation

## 2016-02-11 DIAGNOSIS — I2510 Atherosclerotic heart disease of native coronary artery without angina pectoris: Secondary | ICD-10-CM | POA: Diagnosis not present

## 2016-02-11 DIAGNOSIS — Z7901 Long term (current) use of anticoagulants: Secondary | ICD-10-CM | POA: Insufficient documentation

## 2016-02-11 DIAGNOSIS — Z881 Allergy status to other antibiotic agents status: Secondary | ICD-10-CM | POA: Diagnosis not present

## 2016-02-11 DIAGNOSIS — Z86711 Personal history of pulmonary embolism: Secondary | ICD-10-CM | POA: Insufficient documentation

## 2016-02-11 DIAGNOSIS — Z87891 Personal history of nicotine dependence: Secondary | ICD-10-CM | POA: Insufficient documentation

## 2016-02-11 DIAGNOSIS — Z880 Allergy status to penicillin: Secondary | ICD-10-CM | POA: Insufficient documentation

## 2016-02-11 DIAGNOSIS — G4733 Obstructive sleep apnea (adult) (pediatric): Secondary | ICD-10-CM | POA: Insufficient documentation

## 2016-02-11 DIAGNOSIS — I482 Chronic atrial fibrillation, unspecified: Secondary | ICD-10-CM

## 2016-02-11 DIAGNOSIS — I252 Old myocardial infarction: Secondary | ICD-10-CM | POA: Insufficient documentation

## 2016-02-11 DIAGNOSIS — Z8673 Personal history of transient ischemic attack (TIA), and cerebral infarction without residual deficits: Secondary | ICD-10-CM | POA: Diagnosis not present

## 2016-02-11 DIAGNOSIS — Z95810 Presence of automatic (implantable) cardiac defibrillator: Secondary | ICD-10-CM | POA: Diagnosis not present

## 2016-02-11 DIAGNOSIS — R5383 Other fatigue: Secondary | ICD-10-CM | POA: Diagnosis not present

## 2016-02-11 DIAGNOSIS — Z8249 Family history of ischemic heart disease and other diseases of the circulatory system: Secondary | ICD-10-CM | POA: Diagnosis not present

## 2016-02-11 DIAGNOSIS — Z833 Family history of diabetes mellitus: Secondary | ICD-10-CM | POA: Diagnosis not present

## 2016-02-11 DIAGNOSIS — E119 Type 2 diabetes mellitus without complications: Secondary | ICD-10-CM | POA: Diagnosis not present

## 2016-02-11 DIAGNOSIS — I110 Hypertensive heart disease with heart failure: Secondary | ICD-10-CM | POA: Diagnosis not present

## 2016-02-11 DIAGNOSIS — E785 Hyperlipidemia, unspecified: Secondary | ICD-10-CM | POA: Insufficient documentation

## 2016-02-11 DIAGNOSIS — I5022 Chronic systolic (congestive) heart failure: Secondary | ICD-10-CM | POA: Insufficient documentation

## 2016-02-11 MED ORDER — FUROSEMIDE 20 MG PO TABS: 20.0000 mg | ORAL_TABLET | ORAL | Status: DC | PRN

## 2016-02-11 NOTE — Progress Notes (Signed)
Patient ID: Carl Wood, male   DOB: 10-18-1941, 75 y.o.   MRN: [id number]    Advanced Heart Failure Clinic Note   Referring Physician: Dr Yong Channel  Primary Care: Dr Yong Channel  Primary Cardiologist: EP: Dr Caryl Comes  Neurologists: Dr Leonie Man  HPI: [name] is a 75 year old with a history of MI 1983, ICM, chronic systolic heart failure s/p Medtronic single chamber ICD, CVA 2014, DM , VT, PE, ,htn, permanent A fib on Xarelto, former smoker 2014  referred to the HF clinic by Drs Caryl Comes and [facility] for worsening EF and increasing exercise intolerance.   Today he returns for HF follow up. Overall feeling a little better. Mild dyspnea with inclines. Denies PND/Orthopnea. Using CPAP nightly. Not weighing at home. Taking all medication.   06/2015 ECHO EF 20% Severely dilated mild TR,  12/2012 ECHO EF 30-35%   07/2015: k 4.0 Creatinine 0.86  11/2015: K 4.4, creatinine 0.91  RHC.LHC 01/20/2016 RA = 9 RV = 37/3/10 PA = 41/16 (25) PCW = 18 Fick cardiac output/index = 4.5/2.1 Ao = 115/55 (74) LV = 119/8/15 PVR = 1.5 WU SVR = 1141 FA sat = 95% PA sat = 59%, 64% Assessment: 1. Severe 1v CAD with high grade mid LAD lesion 2. Ischemic CM with EF 25-30% with akinesis of mid anterior wall and aneurysmal deformity of distal anterior wall and apex 3. Normal filling pressures with moderately reduced cardiac output  CPX 08/13/15 Resting HR: 65 Peak HR: 113  (76% age predicted max HR) BP rest: 106/60 BP peak: 130/60 Peak VO2: 14.9 (55.2% predicted peak VO2) VE/VCO2 slope: 27.9 OUES: 2.11 Peak RER: 0.95 Ventilatory Threshold: 11.0 (40.8% predicted or measured peak VO2) Peak RR 28 Peak Ventilation: 41.4 VE/MVV: 55.9% PETCO2 at peak: 37 O2pulse: 12  (71% predicted O2pulse)   Past Medical History  Diagnosis Date  . PE (pulmonary embolism)   . Colon polyps   . CHF (congestive heart failure) ([place])   . CAD (coronary artery disease)   . Diabetes mellitus   . Diverticulosis of colon   .  Hyperlipidemia   . Hypertension   . MI (mitral incompetence)   . Nephrolithiasis   . V-tach ([place])   . NEPHROLITHIASIS 06/26/2008    Qualifier: Diagnosis of  By: Sherlynn Stalls, CMA, [place]    . DIVERTICULOSIS, COLON 10/16/2006    Qualifier: Diagnosis of  By: Leanne Chang MD, [place]      Current Outpatient Prescriptions  Medication Sig Dispense Refill  . atorvastatin (LIPITOR) 80 MG tablet TAKE ONE TABLET BY MOUTH ONCE DAILY AS DIRECTED 30 tablet 5  . Blood Glucose Monitoring Suppl (ACCU-CHEK AVIVA PLUS) w/Device KIT Need to used 1 to 2 times a day 1 kit 0  . carvedilol (COREG) 6.25 MG tablet Take 1 tablet (6.25 mg total) by mouth 2 (two) times daily. 180 tablet 2  . digoxin (LANOXIN) 0.25 MG tablet TAKE ONE-HALF TABLET BY MOUTH ONCE DAILY 45 tablet 1  . ENTRESTO 24-26 MG TAKE ONE TABLET BY MOUTH TWICE DAILY 60 tablet 0  . glucose blood (ACCU-CHEK AVIVA) test strip Use as instructed 100 each 12  . glucose blood (ACCU-CHEK AVIVA) test strip Use to test blood sugars daily. Dx: E11.9 100 each 12  . Lancets (ACCU-CHEK SOFT TOUCH) lancets Use as instructed 100 each 12  . metFORMIN (GLUCOPHAGE) 500 MG tablet Take 1 tablet (500 mg total) by mouth 2 (two) times daily with a meal. 60 tablet 5  . XARELTO 20 MG TABS tablet TAKE ONE  TABLET BY MOUTH ONCE DAILY 30 tablet 5   No current facility-administered medications for this encounter.    Allergies  Allergen Reactions  . Penicillins Other (See Comments)    Patient passed out  . Sulfamethoxazole Rash      Social History   Social History  . Marital Status: Married    Spouse Name: [name]  . Number of Children: 3  . Years of Education: 12   Occupational History  . Retired    Social History Main Topics  . Smoking status: Former Smoker -- 0.75 packs/day for 50 years    Types: Cigarettes    Quit date: 12/15/2012  . Smokeless tobacco: Never Used  . Alcohol Use: No  . Drug Use: No  . Sexual Activity: Not on file   Other Topics Concern  . Not on file    Social History Narrative   Married. 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 Colon cancer Father   . Diabetes Father   . Heart disease Father   . Bladder Cancer Mother   . Heart disease Mother     Carl Wood Vitals:   02/11/16 1405  BP: 107/70  Pulse: 70  Resp: 18  Weight: 220 lb 8 oz (100.018 kg)  SpO2: 95%    PHYSICAL EXAM: General:  Elderly appearing. NAD HEENT: normal Neck: supple. no JVD. Carotids 2+ bilat; no bruits. No thyromegaly or nodule noted. Cor: PMI nondisplaced. Irregularly irregular. No M/G/R appreciated. R sided ICD (previous lead fx) Lungs: Coarse lung sounds, otherwise clear. Normal effort Abdomen: soft, NT, ND, no HSM. No bruits or masses. +BS. Back: Previous surgical scar Extremities: no cyanosis, clubbing, rash, Trace ankle edema  Neuro: alert & oriented x 3, cranial nerves grossly intact. moves all 4 extremities w/o difficulty. Affect pleasant.   ASSESSMENT & PLAN:  1. Chronic Systolic HF- ICM Medtronic ICD. NYHA II-III. ECHO 06/2015 LVEF 20%, Mild MR decreased from 30-35% in 2014. Optivol: Activity ~3 hours. Volume below threshold - NYHA II. Volume status stable.  Add lasix as needed for 3 pound weight gain in 24 hours.  Continue carvedilol 6.25 mg BID and digoxin 0.125 mg - Continue 24-26 mg entresto BID. Considered increasing entresto however has had hyperkalemia in the past.  No spiro for now.  Refer to cardiac rehab.  - 2. OSA - Now using CPAP nightly.  3. Former smoker  - quit 2014 -4. Chronic A fib  - Rate controlled continue current dose of bb  - Continue Xarelto 20 mg daily.  5. Lower extremity fatigue : ABIs ok.  6. CAD- Severe 1V Cad - On statin, bb, xarelto.   Follow up in 3 months.    [name]  02/11/2016   Patient seen and examined with Darrick Grinder, NP. We discussed all aspects of the encounter. I agree with the assessment and plan as stated above.   Overall doing fairly well. I reviewed ICD diagnostics with him personally. Fluid was way up at end of February - now much better. He is not weighing regularly or taking any lasix. Encouraged him to weigh daily and use lasix 14m as needed. Will refer to cardiac rehab. Continue Xarelto for AF.   Paitlyn Mcclatchey,MD 5:27 PM

## 2016-02-11 NOTE — Patient Instructions (Signed)
Start Furosemide (Lasix) 20 mg AS NEEDED for weight gain of 3 lbs in 1 day  Your physician recommends that you schedule a follow-up appointment in: 3 months

## 2016-02-11 NOTE — Progress Notes (Signed)
Medication Samples have been provided to the patient.  Drug name: Xarelto       Strength: 20 mg        Qty: 3  LOT: [id number]  Exp.Date: 8/19  The patient has been instructed regarding the correct time, dose, and frequency of taking this medication, including desired effects and most common side effects.   Carl Wood 3:14 PM 02/11/2016  Medication Samples have been provided to the patient.  Drug name: Delene Loll       Strength: 24/26        Qty: 4  LOT: [id number]  Exp.Date: 4/17  The patient has been instructed regarding the correct time, dose, and frequency of taking this medication, including desired effects and most common side effects.   Carl Wood 3:14 PM 02/11/2016

## 2016-02-19 ENCOUNTER — Ambulatory Visit (INDEPENDENT_AMBULATORY_CARE_PROVIDER_SITE_OTHER): Payer: Commercial Managed Care - HMO | Admitting: Cardiology

## 2016-02-19 ENCOUNTER — Encounter: Payer: Self-pay | Admitting: Cardiology

## 2016-02-19 VITALS — BP 100/60 | HR 58 | Ht 72.0 in | Wt 218.8 lb

## 2016-02-19 DIAGNOSIS — G4719 Other hypersomnia: Secondary | ICD-10-CM | POA: Diagnosis not present

## 2016-02-19 DIAGNOSIS — I10 Essential (primary) hypertension: Secondary | ICD-10-CM | POA: Diagnosis not present

## 2016-02-19 DIAGNOSIS — G4733 Obstructive sleep apnea (adult) (pediatric): Secondary | ICD-10-CM | POA: Diagnosis not present

## 2016-02-19 HISTORY — DX: Obstructive sleep apnea (adult) (pediatric): G47.33

## 2016-02-19 HISTORY — DX: Other hypersomnia: G47.19

## 2016-02-19 NOTE — Patient Instructions (Signed)

## 2016-02-19 NOTE — Progress Notes (Signed)
Cardiology Office Note   Date:  02/19/2016   ID:  CURVIN Wood, DOB 23-Sep-1941, MRN [id number]  PCP:  [name], MD    Chief Complaint  Patient presents with  . Sleep Apnea    sleep f/u      History of Present Illness: Carl Wood is a 75 y.o. male who presents for today for evaluation of OSA.  He was set up for Home Sleep Study by Dr.Klein for snoring and excessive daytime sleepiness. He also has a history of atrial fibrillation.   This showed moderate to severe OSA with an AHI of 25.2/hr.  He underwent CPAP titration to 8cm H2O.  The time spent with oxygen saturations < 88% after CPAP titration, was 1.9 minutes.  He now presents today for evaluation.  He is tolerating his CPAP device without any problems.  He tolerates the nasal mask but breathses through his mouth and is going to get a full face mask next months. He feels the pressure is adequate.  Since going on CPAP, he feels more rested in the am but says that he still has daytime sleepiness.  He has no mouth dryness or nasal congestion.  He goes to bed at 12MN to 1:30am and gets up at 9-11am.  He used to wake up 2-3 times nightly to urinate but on CPAP he only has to get up once nightly. He no longer snores on CPAP.      Past Medical History  Diagnosis Date  . PE (pulmonary embolism)   . Colon polyps   . CHF (congestive heart failure) ([place])   . CAD (coronary artery disease)   . Diabetes mellitus   . Diverticulosis of colon   . Hyperlipidemia   . Hypertension   . MI (mitral incompetence)   . Nephrolithiasis   . V-tach ([place])   . NEPHROLITHIASIS 06/26/2008    Qualifier: Diagnosis of  By: Carl Wood, CMA, [place]    . DIVERTICULOSIS, COLON 10/16/2006    Qualifier: Diagnosis of  By: Carl Chang MD, [place]      Past Surgical History  Procedure Laterality Date  . Back surgery    . Cardiac defibrillator placement      medtronic virtuoso  . Basal cell carcinoma excision      nose  . Knee  arthroplasty    . Cholecystectomy    . Colonoscopy  12/11/2012    Procedure: COLONOSCOPY;  Surgeon: Carl Castle, MD;  Location: [facility];  Service: Endoscopy;  Laterality: N/A;  . Cardiac catheterization N/A 01/20/2016    Procedure: Right/Left Heart Cath and Coronary Angiography;  Surgeon: Carl Artist, MD;  Location: [facility];  Service: Cardiovascular;  Laterality: N/A;     Current Outpatient Prescriptions  Medication Sig Dispense Refill  . atorvastatin (LIPITOR) 80 MG tablet TAKE ONE TABLET BY MOUTH ONCE DAILY AS DIRECTED 30 tablet 5  . carvedilol (COREG) 6.25 MG tablet Take 1 tablet (6.25 mg total) by mouth 2 (two) times daily. 180 tablet 2  . digoxin (LANOXIN) 0.25 MG tablet TAKE ONE-HALF TABLET BY MOUTH ONCE DAILY 45 tablet 1  . ENTRESTO 24-26 MG TAKE ONE TABLET BY MOUTH TWICE DAILY 60 tablet 0  . furosemide (LASIX) 20 MG tablet Take 1 tablet (20 mg total) by mouth as needed (for wt gain of 3 lb in 24 hours). 30 tablet 3  . metFORMIN (GLUCOPHAGE) 500 MG tablet  Take 1 tablet (500 mg total) by mouth 2 (two) times daily with a meal. 60 tablet 5  . XARELTO 20 MG TABS tablet TAKE ONE TABLET BY MOUTH ONCE DAILY 30 tablet 5   No current facility-administered medications for this visit.    Allergies:   Penicillins and Sulfamethoxazole    Social History:  The patient  reports that he quit smoking about 3 years ago. His smoking use included Cigarettes. He has a 37.5 pack-year smoking history. He has never used smokeless tobacco. He reports that he does not drink alcohol or use illicit drugs.   Family History:  The patient's family history includes Bladder Cancer in his mother; Colon cancer in his father; Diabetes in his father; Heart disease in his father and mother.    ROS:  Please see the history of present illness.   Otherwise, review of systems are positive for none.   All other systems are reviewed and negative.    PHYSICAL EXAM: VS:  BP 100/60 mmHg  Pulse 58   Ht 6' (1.829 m)  Wt 218 lb 12.8 oz (99.247 kg)  BMI 29.67 kg/m2  SpO2 94% , BMI Body mass index is 29.67 kg/(m^2). GEN: Well nourished, well developed, in no acute distress HEENT: normal Neck: no JVD, carotid bruits, or masses Cardiac: irregularly irregular; no murmurs, rubs, or gallops,no edema  Respiratory:  clear to auscultation bilaterally, normal work of breathing GI: soft, nontender, nondistended, + BS MS: no deformity or atrophy Skin: warm and dry, no rash Neuro:  Strength and sensation are intact Psych: euthymic mood, full affect   EKG:  EKG is not ordered today.    Recent Labs: 07/21/2015: TSH 2.27 11/18/2015: ALT 18 01/20/2016: BUN 13; Creatinine, Ser 0.98; Hemoglobin 14.4; Platelets 149*; Potassium 4.5; Sodium 140    Lipid Panel    Component Value Date/Time   CHOL 130 11/15/2014 0911   TRIG 59.0 11/15/2014 0911   TRIG 53 10/10/2006 1113   HDL 35.40* 11/15/2014 0911   CHOLHDL 4 11/15/2014 0911   CHOLHDL 4.4 CALC 10/10/2006 1113   VLDL 11.8 11/15/2014 0911   LDLCALC 83 11/15/2014 0911   LDLDIRECT 71.0 03/17/2015 0942      Wt Readings from Last 3 Encounters:  02/19/16 218 lb 12.8 oz (99.247 kg)  02/11/16 220 lb 8 oz (100.018 kg)  01/20/16 212 lb (96.163 kg)        ASSESSMENT AND PLAN:  1.  Moderate to severe OSA now on CPAP and tolerating well.  His d/l today showed an AHI of 1.4/hr on 8cm H2O and 100% compliance in using more than 4 hours nightly. Patient has been using and benefiting from CPAP use and will continue to benefit from therapy. I have also instructed the patient on proper sleep hygiene, avoidance of sleeping in the supine position and avoidance of alcohol within 4 hours of bedtime.  The patient was also instructed to avoid driving if sleepy.   2.  HTN - BP controlled on current medical therapy.   3.  Excessive daytime sleepiness - His OSA is very well treated but he continues to be sleepy during the day.  His wife thinks he has more energy.   He is going to start in Cardiac Rehab to exercise and hopefully this will give him more energy.   Current medicines are reviewed at length with the patient today.  The patient does not have concerns regarding medicines.  The following changes have been made:  no change  Labs/ tests  ordered today: See above Assessment and Plan No orders of the defined types were placed in this encounter.     Disposition:   FU with me in 6 months  Signed, Sueanne Margarita, MD  02/19/2016 2:56 PM    [facility] [place], [place], [place]  [address] Phone: [phone]; Fax: [phone]

## 2016-02-23 ENCOUNTER — Encounter: Payer: Self-pay | Admitting: Internal Medicine

## 2016-02-25 ENCOUNTER — Encounter: Payer: Self-pay | Admitting: Cardiology

## 2016-03-03 ENCOUNTER — Ambulatory Visit (INDEPENDENT_AMBULATORY_CARE_PROVIDER_SITE_OTHER): Payer: Commercial Managed Care - HMO | Admitting: *Deleted

## 2016-03-03 DIAGNOSIS — I255 Ischemic cardiomyopathy: Secondary | ICD-10-CM | POA: Diagnosis not present

## 2016-03-03 DIAGNOSIS — I472 Ventricular tachycardia: Secondary | ICD-10-CM | POA: Diagnosis not present

## 2016-03-03 DIAGNOSIS — I4729 Other ventricular tachycardia: Secondary | ICD-10-CM

## 2016-03-03 LAB — CUP PACEART REMOTE DEVICE CHECK
Brady Statistic RV Percent Paced: 2.3 %
HighPow Impedance: 80 Ohm
Implantable Lead Implant Date: 20010723
Implantable Lead Location: 753860
Implantable Lead Model: 6943
Lead Channel Setting Pacing Pulse Width: 0.8 ms
MDC IDC MSMT BATTERY VOLTAGE: 2.71 V
MDC IDC MSMT LEADCHNL RV IMPEDANCE VALUE: 480 Ohm
MDC IDC MSMT LEADCHNL RV SENSING INTR AMPL: 9.48 mV
MDC IDC SESS DTM: 20170329062606
MDC IDC SET LEADCHNL RV PACING AMPLITUDE: 2.5 V
MDC IDC SET LEADCHNL RV SENSING SENSITIVITY: 0.3 mV

## 2016-03-03 NOTE — Progress Notes (Signed)
Remote ICD transmission.   

## 2016-03-04 DIAGNOSIS — G4733 Obstructive sleep apnea (adult) (pediatric): Secondary | ICD-10-CM | POA: Diagnosis not present

## 2016-03-12 DIAGNOSIS — G4733 Obstructive sleep apnea (adult) (pediatric): Secondary | ICD-10-CM | POA: Diagnosis not present

## 2016-03-17 ENCOUNTER — Encounter: Payer: Self-pay | Admitting: Cardiology

## 2016-03-18 ENCOUNTER — Encounter: Payer: Self-pay | Admitting: Family Medicine

## 2016-03-18 ENCOUNTER — Other Ambulatory Visit: Payer: Self-pay | Admitting: *Deleted

## 2016-03-18 ENCOUNTER — Ambulatory Visit (INDEPENDENT_AMBULATORY_CARE_PROVIDER_SITE_OTHER): Payer: Commercial Managed Care - HMO | Admitting: Family Medicine

## 2016-03-18 VITALS — BP 120/72 | HR 59 | Temp 97.8°F | Wt 213.0 lb

## 2016-03-18 DIAGNOSIS — J209 Acute bronchitis, unspecified: Secondary | ICD-10-CM

## 2016-03-18 DIAGNOSIS — E119 Type 2 diabetes mellitus without complications: Secondary | ICD-10-CM

## 2016-03-18 DIAGNOSIS — I10 Essential (primary) hypertension: Secondary | ICD-10-CM

## 2016-03-18 DIAGNOSIS — E785 Hyperlipidemia, unspecified: Secondary | ICD-10-CM

## 2016-03-18 LAB — COMPREHENSIVE METABOLIC PANEL
ALT: 19 U/L (ref 0–53)
AST: 17 U/L (ref 0–37)
Albumin: 3.9 g/dL (ref 3.5–5.2)
Alkaline Phosphatase: 58 U/L (ref 39–117)
BUN: 13 mg/dL (ref 6–23)
CALCIUM: 9.5 mg/dL (ref 8.4–10.5)
CHLORIDE: 103 meq/L (ref 96–112)
CO2: 31 meq/L (ref 19–32)
Creatinine, Ser: 0.85 mg/dL (ref 0.40–1.50)
GFR: 94.03 mL/min (ref >60.00)
Glucose, Bld: 168 mg/dL — ABNORMAL HIGH (ref 70–99)
POTASSIUM: 4.2 meq/L (ref 3.5–5.1)
Sodium: 138 mEq/L (ref 135–145)
Total Bilirubin: 1 mg/dL (ref 0.2–1.2)
Total Protein: 7.2 g/dL (ref 6.0–8.3)

## 2016-03-18 LAB — CBC
HEMATOCRIT: 42.3 % (ref 39.0–52.0)
HEMOGLOBIN: 14.4 g/dL (ref 13.0–17.0)
MCHC: 34.1 g/dL (ref 30.0–36.0)
MCV: 95.8 fl (ref 78.0–100.0)
PLATELETS: 224 10*3/uL (ref 150.0–400.0)
RBC: 4.41 Mil/uL (ref 4.22–5.81)
RDW: 13.3 % (ref 11.5–15.5)
WBC: 6.7 10*3/uL (ref 4.0–10.5)

## 2016-03-18 LAB — LIPID PANEL
CHOL/HDL RATIO: 4
CHOLESTEROL: 112 mg/dL (ref 0–200)
HDL: 29.3 mg/dL — AB (ref >39.00)
LDL Cholesterol: 69 mg/dL (ref 0–99)
NonHDL: 82.33
TRIGLYCERIDES: 66 mg/dL (ref 0.0–149.0)
VLDL: 13.2 mg/dL (ref 0.0–40.0)

## 2016-03-18 LAB — HEMOGLOBIN A1C: HEMOGLOBIN A1C: 7.8 % — AB (ref 4.6–6.5)

## 2016-03-18 MED ORDER — DOXYCYCLINE MONOHYDRATE 100 MG PO CAPS: 100.0000 mg | ORAL_CAPSULE | Freq: Two times a day (BID) | ORAL | Status: DC

## 2016-03-18 MED ORDER — GLIMEPIRIDE 2 MG PO TABS: 2.0000 mg | ORAL_TABLET | Freq: Every day | ORAL | Status: DC

## 2016-03-18 NOTE — Assessment & Plan Note (Signed)
S: reasonably controlled on atorvastatin 80mg  but LDL slightly above goal 70. No myalgias.  Lab Results  Component Value Date   CHOL 130 11/15/2014   HDL 35.40* 11/15/2014   LDLCALC 83 11/15/2014   LDLDIRECT 71.0 03/17/2015   TRIG 59.0 11/15/2014   CHOLHDL 4 11/15/2014   A/P: check lipids today

## 2016-03-18 NOTE — Assessment & Plan Note (Signed)
S: controlled on carvedilol and entresto BP Readings from Last 3 Encounters:  03/18/16 120/72  02/19/16 100/60  02/11/16 107/70  A/P:Continue current meds:  No change

## 2016-03-18 NOTE — Patient Instructions (Addendum)
No changes in meds  Labs before you leave  Doxycycline twice a day for 7 days for your bronchitis. If does not resolve within 10 days or worsens return to see me.

## 2016-03-18 NOTE — Assessment & Plan Note (Signed)
S: poorly controlled on no medicine in past. Started 500mg  BID metformin at last visit. Flushing at times so will not increase dose.  CBGs- in the mornings 120s to 140s.  Lab Results  Component Value Date   HGBA1C 8.4* 11/18/2015   HGBA1C 7.5* 07/28/2015   HGBA1C 7.4* 07/21/2015   A/P: if a1c 7.6 or higher add amaryl 2mg . dont think we can bump metformin more due to flushing SE

## 2016-03-18 NOTE — Progress Notes (Signed)
Carl Reddish, MD  Subjective:  Carl Wood is a 75 y.o. year old very pleasant male patient who presents for/with See problem oriented charting ROS- always has some shortness of breath no recent increase, fever last week, increased sputum production and color change  Past Medical History-  Patient Active Problem List   Diagnosis Date Noted  . Chronic systolic heart failure ([place]) 08/06/2015    Priority: High  . Former smoker 11/15/2014    Priority: High  . Atrial fibrillation ([place]) 12/19/2012    Priority: High  . CVA (cerebral infarction) 12/19/2012    Priority: High  .  ventricular tachycardia-non sustained  02/17/2012    Priority: High  . Cardiomyopathy, ischemic 09/23/2009    Priority: High  . Diabetes mellitus type II, controlled ([place]) 10/16/2006    Priority: High  . CAD (coronary artery disease) 10/16/2006    Priority: High  . PULMONARY EMBOLISM, HX OF 10/16/2006    Priority: High  . OSA (obstructive sleep apnea) 02/19/2016    Priority: Medium  . Carotid artery stenosis 03/17/2015    Priority: Medium  . Automatic implantable cardiac defibrillator  medtronic 02/17/2012    Priority: Medium  . History of skin cancer 07/05/2007    Priority: Medium  . Hyperlipidemia 10/16/2006    Priority: Medium  . Essential hypertension 10/16/2006    Priority: Medium  . LBBB (left bundle branch block) 08/06/2015    Priority: Low  . Actinic keratosis 11/15/2014    Priority: Low  . Trigger finger, acquired 03/14/2014    Priority: Low  . Benign neoplasm of colon 12/11/2012    Priority: Low  . PSEUDOGOUT 08/23/2008    Priority: Low  . Excessive daytime sleepiness 02/19/2016    Medications- reviewed and updated Current Outpatient Prescriptions  Medication Sig Dispense Refill  . atorvastatin (LIPITOR) 80 MG tablet TAKE ONE TABLET BY MOUTH ONCE DAILY AS DIRECTED 30 tablet 5  . carvedilol (COREG) 6.25 MG tablet Take 1 tablet (6.25 mg total) by mouth 2 (two) times daily. 180  tablet 2  . digoxin (LANOXIN) 0.25 MG tablet TAKE ONE-HALF TABLET BY MOUTH ONCE DAILY 45 tablet 1  . ENTRESTO 24-26 MG TAKE ONE TABLET BY MOUTH TWICE DAILY 60 tablet 0  . furosemide (LASIX) 20 MG tablet Take 1 tablet (20 mg total) by mouth as needed (for wt gain of 3 lb in 24 hours). 30 tablet 3  . metFORMIN (GLUCOPHAGE) 500 MG tablet Take 1 tablet (500 mg total) by mouth 2 (two) times daily with a meal. 60 tablet 5  . XARELTO 20 MG TABS tablet TAKE ONE TABLET BY MOUTH ONCE DAILY 30 tablet 5   No current facility-administered medications for this visit.    Objective: BP 120/72 mmHg  Pulse 59  Temp(Src) 97.8 F (36.6 C)  Wt 213 lb (96.616 kg)  SpO2 95% Gen: NAD, resting comfortably TM normal, oropharynx normal, rhinorrhea noted CV: RRR no murmurs rubs or gallops Lungs: bilateral rhonchi and wheeze Abdomen: soft/nontender/nondistended/normal bowel sounds. No rebound or guarding.  Ext: no edema Skin: warm, dry, no rash Neuro: grossly normal, moves all extremities  Assessment/Plan:  Diabetes mellitus type II, controlled ([place]) S: poorly controlled on no medicine in past. Started 500mg  BID metformin at last visit. Flushing at times so will not increase dose.  CBGs- in the mornings 120s to 140s.  Lab Results  Component Value Date   HGBA1C 8.4* 11/18/2015   HGBA1C 7.5* 07/28/2015   HGBA1C 7.4* 07/21/2015   A/P: if a1c  7.6 or higher add amaryl 2mg . dont think we can bump metformin more due to flushing SE  Hyperlipidemia S: reasonably controlled on atorvastatin 80mg  but LDL slightly above goal 70. No myalgias.  Lab Results  Component Value Date   CHOL 130 11/15/2014   HDL 35.40* 11/15/2014   LDLCALC 83 11/15/2014   LDLDIRECT 71.0 03/17/2015   TRIG 59.0 11/15/2014   CHOLHDL 4 11/15/2014   A/P: check lipids today  Essential hypertension S: controlled on carvedilol and entresto BP Readings from Last 3 Encounters:  03/18/16 120/72  02/19/16 100/60  02/11/16 107/70   A/P:Continue current meds:  No change  Bronchitis S: Last week with fever, throwing up diarrhea, chills. No increase form baseline Shortness of breathStarted feeling better on Saturday but feels chest congested and "cannot break it up".  A/P: High risk patient-may have underlying copd. May be viral but will cover with doxycycline- if persists despite this follow up and consider x-ray sooner if worsens. If coverage issues- could use azithromycin  No Follow-up on file. Return precautions advised.   Orders Placed This Encounter  Procedures  . Lipid panel    [place]    Order Specific Question:  Has the patient fasted?    Answer:  No  . CBC    [place]  . Comprehensive metabolic panel    [place]    Order Specific Question:  Has the patient fasted?    Answer:  No  . Hemoglobin A1c        No orders of the defined types were placed in this encounter.

## 2016-03-27 ENCOUNTER — Other Ambulatory Visit (HOSPITAL_COMMUNITY): Payer: Self-pay | Admitting: Internal Medicine

## 2016-03-27 ENCOUNTER — Other Ambulatory Visit: Payer: Self-pay | Admitting: Internal Medicine

## 2016-03-29 ENCOUNTER — Telehealth: Payer: Self-pay | Admitting: Family Medicine

## 2016-03-29 NOTE — Telephone Encounter (Signed)
error 

## 2016-03-30 ENCOUNTER — Encounter: Payer: Self-pay | Admitting: Family Medicine

## 2016-03-30 ENCOUNTER — Ambulatory Visit (INDEPENDENT_AMBULATORY_CARE_PROVIDER_SITE_OTHER): Payer: Commercial Managed Care - HMO | Admitting: Family Medicine

## 2016-03-30 VITALS — BP 112/70 | HR 77 | Temp 97.9°F | Wt 216.0 lb

## 2016-03-30 DIAGNOSIS — J069 Acute upper respiratory infection, unspecified: Secondary | ICD-10-CM

## 2016-03-30 MED ORDER — BENZONATATE 100 MG PO CAPS: 100.0000 mg | ORAL_CAPSULE | Freq: Two times a day (BID) | ORAL | Status: DC | PRN

## 2016-03-30 NOTE — Patient Instructions (Signed)
For symptomatic treatment Would schedule mucinex-DM twice a day (see handout) If cough still not controlled can use tessalon perles  I want you to let me know immediately if you get more short of brath or have fever  Given your smoking history and other medical problems, I want you to call me late Thursday or early Friday so we can make a decision before the weekend about starting antibiotics  Upper Respiratory Infection, Adult Most upper respiratory infections (URIs) are a viral infection of the air passages leading to the lungs. A URI affects the nose, throat, and upper air passages. The most common type of URI is nasopharyngitis and is typically referred to as "the common cold." URIs run their course and usually go away on their own. Most of the time, a URI does not require medical attention, but sometimes a bacterial infection in the upper airways can follow a viral infection. This is called a secondary infection. Sinus and middle ear infections are common types of secondary upper respiratory infections. Bacterial pneumonia can also complicate a URI. A URI can worsen asthma and chronic obstructive pulmonary disease (COPD). Sometimes, these complications can require emergency medical care and may be life threatening.  CAUSES Almost all URIs are caused by viruses. A virus is a type of germ and can spread from one person to another.  RISKS FACTORS You may be at risk for a URI if:   You smoke.   You have chronic heart or lung disease.  You have a weakened defense (immune) system.   You are very young or very old.   You have nasal allergies or asthma.  You work in crowded or poorly ventilated areas.  You work in health care facilities or schools. SIGNS AND SYMPTOMS  Symptoms typically develop 2-3 days after you come in contact with a cold virus. Most viral URIs last 7-10 days. However, viral URIs from the influenza virus (flu virus) can last 14-18 days and are typically more severe.  Symptoms may include:   Runny or stuffy (congested) nose.   Sneezing.   Cough.   Sore throat.   Headache.   Fatigue.   Fever.   Loss of appetite.   Pain in your forehead, behind your eyes, and over your cheekbones (sinus pain).  Muscle aches.  DIAGNOSIS  Your health care provider may diagnose a URI by:  Physical exam.  Tests to check that your symptoms are not due to another condition such as:  Strep throat.  Sinusitis.  Pneumonia.  Asthma. TREATMENT  A URI goes away on its own with time. It cannot be cured with medicines, but medicines may be prescribed or recommended to relieve symptoms. Medicines may help:  Reduce your fever.  Reduce your cough.  Relieve nasal congestion. HOME CARE INSTRUCTIONS   Take medicines only as directed by your health care provider.   Gargle warm saltwater or take cough drops to comfort your throat as directed by your health care provider.  Use a warm mist humidifier or inhale steam from a shower to increase air moisture. This may make it easier to breathe.  Drink enough fluid to keep your urine clear or pale yellow.   Eat soups and other clear broths and maintain good nutrition.   Rest as needed.   Return to work when your temperature has returned to normal or as your health care provider advises. You may need to stay home longer to avoid infecting others. You can also use a face mask and careful  hand washing to prevent spread of the virus.  Increase the usage of your inhaler if you have asthma.   Do not use any tobacco products, including cigarettes, chewing tobacco, or electronic cigarettes. If you need help quitting, ask your health care provider. PREVENTION  The best way to protect yourself from getting a cold is to practice good hygiene.   Avoid oral or hand contact with people with cold symptoms.   Wash your hands often if contact occurs.  There is no clear evidence that vitamin C, vitamin E,  echinacea, or exercise reduces the chance of developing a cold. However, it is always recommended to get plenty of rest, exercise, and practice good nutrition.  SEEK MEDICAL CARE IF:   You are getting worse rather than better.   Your symptoms are not controlled by medicine.   You have chills.  You have worsening shortness of breath.  You have brown or red mucus.  You have yellow or brown nasal discharge.  You have pain in your face, especially when you bend forward.  You have a fever.  You have swollen neck glands.  You have pain while swallowing.  You have white areas in the back of your throat. SEEK IMMEDIATE MEDICAL CARE IF:   You have severe or persistent:  Headache.  Ear pain.  Sinus pain.  Chest pain.  You have chronic lung disease and any of the following:  Wheezing.  Prolonged cough.  Coughing up blood.  A change in your usual mucus.  You have a stiff neck.  You have changes in your:  Vision.  Hearing.  Thinking.  Mood. MAKE SURE YOU:   Understand these instructions.  Will watch your condition.  Will get help right away if you are not doing well or get worse.   This information is not intended to replace advice given to you by your health care provider. Make sure you discuss any questions you have with your health care provider.   Document Released: 05/18/2001 Document Revised: 04/08/2015 Document Reviewed: 02/27/2014 Elsevier Interactive Patient Education [employer].

## 2016-03-30 NOTE — Progress Notes (Signed)
PCP: [name], MD  Subjective:  Carl Wood is a 75 y.o. year old very pleasant male patient who presents with Upper Respiratory infection symptoms including nasal and upper chest congestion, rhinorrhea, cough productive of clear sputum. Has some mild wheeze as well -started: last Thursday and symptoms show no change -previous treatments: rest, hydration- no other treatments tried.  -sick contacts/travel/risks: denies flu exposure.  -Hx of: allergies  ROS-denies fever, SOB above baseline with ischemic cardiomyopathy, NVD, tooth pain or sinus pain  Pertinent Past Medical History- former  Smoker long term quit in 2013- high risk for bacterial cause with potential underlying copd  Medications- reviewed  Current Outpatient Prescriptions  Medication Sig Dispense Refill  . atorvastatin (LIPITOR) 80 MG tablet TAKE ONE TABLET BY MOUTH ONCE DAILY AS DIRECTED 30 tablet 5  . carvedilol (COREG) 6.25 MG tablet Take 1 tablet (6.25 mg total) by mouth 2 (two) times daily. 180 tablet 2  . digoxin (LANOXIN) 0.25 MG tablet TAKE ONE-HALF TABLET BY MOUTH ONCE DAILY 45 tablet 1  . ENTRESTO 24-26 MG TAKE ONE TABLET BY MOUTH TWICE DAILY 60 tablet 0  . furosemide (LASIX) 20 MG tablet Take 1 tablet (20 mg total) by mouth as needed (for wt gain of 3 lb in 24 hours). 30 tablet 3  . glimepiride (AMARYL) 2 MG tablet Take 1 tablet (2 mg total) by mouth daily before breakfast. 90 tablet 3  . metFORMIN (GLUCOPHAGE) 500 MG tablet Take 1 tablet (500 mg total) by mouth 2 (two) times daily with a meal. 60 tablet 5  . XARELTO 20 MG TABS tablet TAKE ONE TABLET BY MOUTH ONCE DAILY 30 tablet 5  . benzonatate (TESSALON) 100 MG capsule Take 1 capsule (100 mg total) by mouth 2 (two) times daily as needed for cough. 20 capsule 0   No current facility-administered medications for this visit.    Objective: BP 112/70 mmHg  Pulse 77  Temp(Src) 97.9 F (36.6 C)  Wt 216 lb (97.977 kg)  SpO2 93% Gen: NAD, resting  comfortably HEENT: Turbinates erythematous with clear rhinorrhea, TM normal, pharynx mildly erythematous with no tonsilar exudate or edema, no sinus tenderness CV: irregularly irregular, no murmurs rubs or gallops Lungs: CTAB no crackles,  Rhonchi. Wheeze but seems to be transmitted from upper airway and not into lungs Abdomen: soft/nontender/nondistended/normal bowel sounds. No rebound or guarding.  Ext: no edema Skin: warm, dry, no rash Neuro: grossly normal, moves all extremities  Assessment/Plan:  Upper Respiratory infection History and exam today are suggestive of viral URI.  We discussed that a serious infection or illness is unlikely but with higher probability with my concern he could have underlying COPD. We also discussed reasons why current illness does not meet criteria for bacterial illness and therefore no antibiotics indicated. Also educated on signs that bacterial infection may have developed. We discussed treatment side effects, likely course, antibiotic misuse, transmission, and signs of developing a serious illness.  Symptomatic treatment with: mucinex DM as well as tesallon perles prn  Finally, we reviewed reasons to return to care including if symptoms worsen or persist or new concerns arise. Patient is very high risk with ischemic cardiomyopathy, CVA, heart failure, a fib, as well as potential COPD. We discussed if he is not improving by Friday to consider antibiotics such as either azithromycin or doxycline- would consider at that time  Meds ordered this encounter  Medications  . benzonatate (TESSALON) 100 MG capsule    Sig: Take 1 capsule (100 mg total)  by mouth 2 (two) times daily as needed for cough.    Dispense:  20 capsule    Refill:  0

## 2016-04-02 ENCOUNTER — Telehealth: Payer: Self-pay

## 2016-04-02 NOTE — Telephone Encounter (Signed)
Great news glad for update and thankful he is doing better. Continue without antibiotics

## 2016-04-02 NOTE — Telephone Encounter (Signed)
Pt called stating he was told to call and update you on how he is feeling for you to make a decision regarding antibiotics. Pt states he is doing much better and feels better, he does still cough every now and then but not like he has been.

## 2016-04-04 DIAGNOSIS — G4733 Obstructive sleep apnea (adult) (pediatric): Secondary | ICD-10-CM | POA: Diagnosis not present

## 2016-04-15 ENCOUNTER — Encounter: Payer: Self-pay | Admitting: Family Medicine

## 2016-04-15 ENCOUNTER — Ambulatory Visit (INDEPENDENT_AMBULATORY_CARE_PROVIDER_SITE_OTHER): Payer: Commercial Managed Care - HMO | Admitting: Family Medicine

## 2016-04-15 VITALS — BP 110/72 | HR 69 | Temp 97.6°F | Ht 72.0 in | Wt 215.0 lb

## 2016-04-15 DIAGNOSIS — R21 Rash and other nonspecific skin eruption: Secondary | ICD-10-CM

## 2016-04-15 NOTE — Patient Instructions (Signed)
We will call you within a week about your referral to dermatology. If you do not hear within 2 weeks, give [place] a call. I asked them to try to work you in next week.

## 2016-04-15 NOTE — Progress Notes (Signed)
Subjective:  Carl Wood is a 75 y.o. year old very pleasant male patient who presents for/with See problem oriented charting ROS- .see ROS included in HPI as well.   Past Medical History-  Patient Active Problem List   Diagnosis Date Noted  . Chronic systolic heart failure ([place]) 08/06/2015    Priority: High  . Former smoker 11/15/2014    Priority: High  . Atrial fibrillation ([place]) 12/19/2012    Priority: High  . CVA (cerebral infarction) 12/19/2012    Priority: High  .  ventricular tachycardia-non sustained  02/17/2012    Priority: High  . Cardiomyopathy, ischemic 09/23/2009    Priority: High  . Diabetes mellitus type II, controlled ([place]) 10/16/2006    Priority: High  . CAD (coronary artery disease) 10/16/2006    Priority: High  . PULMONARY EMBOLISM, HX OF 10/16/2006    Priority: High  . OSA (obstructive sleep apnea) 02/19/2016    Priority: Medium  . Carotid artery stenosis 03/17/2015    Priority: Medium  . Automatic implantable cardiac defibrillator  medtronic 02/17/2012    Priority: Medium  . History of skin cancer 07/05/2007    Priority: Medium  . Hyperlipidemia 10/16/2006    Priority: Medium  . Essential hypertension 10/16/2006    Priority: Medium  . LBBB (left bundle branch block) 08/06/2015    Priority: Low  . Actinic keratosis 11/15/2014    Priority: Low  . Trigger finger, acquired 03/14/2014    Priority: Low  . Benign neoplasm of colon 12/11/2012    Priority: Low  . PSEUDOGOUT 08/23/2008    Priority: Low  . Excessive daytime sleepiness 02/19/2016    Medications- reviewed and updated Current Outpatient Prescriptions  Medication Sig Dispense Refill  . atorvastatin (LIPITOR) 80 MG tablet TAKE ONE TABLET BY MOUTH ONCE DAILY AS DIRECTED 30 tablet 5  . carvedilol (COREG) 6.25 MG tablet Take 1 tablet (6.25 mg total) by mouth 2 (two) times daily. 180 tablet 2  . digoxin (LANOXIN) 0.25 MG tablet TAKE ONE-HALF TABLET BY MOUTH ONCE DAILY 45 tablet 1  .  ENTRESTO 24-26 MG TAKE ONE TABLET BY MOUTH TWICE DAILY 60 tablet 0  . furosemide (LASIX) 20 MG tablet Take 1 tablet (20 mg total) by mouth as needed (for wt gain of 3 lb in 24 hours). 30 tablet 3  . glimepiride (AMARYL) 2 MG tablet Take 1 tablet (2 mg total) by mouth daily before breakfast. 90 tablet 3  . metFORMIN (GLUCOPHAGE) 500 MG tablet Take 1 tablet (500 mg total) by mouth 2 (two) times daily with a meal. 60 tablet 5  . XARELTO 20 MG TABS tablet TAKE ONE TABLET BY MOUTH ONCE DAILY 30 tablet 5   No current facility-administered medications for this visit.    Objective: BP 110/72 mmHg  Pulse 69  Temp(Src) 97.6 F (36.4 C) (Oral)  Ht 6' (1.829 m)  Wt 215 lb (97.523 kg)  BMI 29.15 kg/m2  SpO2 96% Gen: NAD, resting comfortably, well appearing Skin: warm, dry, 2 small erythematous papules on left cheek, lesion under left chin that is 3 x 2 cm erythetmatous with some scaling. Has a 1 x 1 cm erythematous papule with scale on left upper arm and similar lesion on left upper arm  Assessment/Plan:  Rash S:started about a month ago. Noted all 5 lesions as once- 2 on cheek, 1 under left chin, 1 on each upper arm. He decided to try his wife's fluorouracil cream and areas became more irritated as expected but then  have not healed since that time. Mild itch if rubs against them but not otherwise. No other treatments tried. No pain. Not expanding. No new contacts ROS-not ill appearing, no fever/chills. No new medications. not immunocompromised. No mucus membrane involvement.  A/P: Unclear etiology of rash- use of fluorouracil may have changed prior appearance. Does not appear to be cancerous though lesion on right upper arm could be an AK or early squamous. We will refer to Dr. Ledell Wood office for dermatology care- his wife sees them and that's his preference.   Return precautions advised.   Orders Placed This Encounter  Procedures  . Ambulatory referral to Dermatology    Referral Priority:   Routine    Referral Type:  Consultation    Referral Reason:  Specialty Services Required    Requested Specialty:  Dermatology    Number of Visits Requested:  1   Carl Reddish, MD

## 2016-04-21 DIAGNOSIS — L814 Other melanin hyperpigmentation: Secondary | ICD-10-CM | POA: Diagnosis not present

## 2016-04-21 DIAGNOSIS — L820 Inflamed seborrheic keratosis: Secondary | ICD-10-CM | POA: Diagnosis not present

## 2016-04-21 DIAGNOSIS — D1801 Hemangioma of skin and subcutaneous tissue: Secondary | ICD-10-CM | POA: Diagnosis not present

## 2016-04-21 DIAGNOSIS — L570 Actinic keratosis: Secondary | ICD-10-CM | POA: Diagnosis not present

## 2016-04-26 ENCOUNTER — Other Ambulatory Visit: Payer: Self-pay | Admitting: Internal Medicine

## 2016-04-26 ENCOUNTER — Other Ambulatory Visit (HOSPITAL_COMMUNITY): Payer: Self-pay | Admitting: Internal Medicine

## 2016-05-04 DIAGNOSIS — G4733 Obstructive sleep apnea (adult) (pediatric): Secondary | ICD-10-CM | POA: Diagnosis not present

## 2016-05-13 ENCOUNTER — Encounter (HOSPITAL_COMMUNITY): Payer: Self-pay | Admitting: Internal Medicine

## 2016-05-13 ENCOUNTER — Ambulatory Visit (HOSPITAL_COMMUNITY)
Admission: RE | Admit: 2016-05-13 | Discharge: 2016-05-13 | Disposition: A | Discharge status: Home / Self Care | Payer: Commercial Managed Care - HMO | Source: Ambulatory Visit | Attending: Internal Medicine | Admitting: Internal Medicine

## 2016-05-13 VITALS — BP 102/68 | HR 65 | Resp 18 | Wt 216.5 lb

## 2016-05-13 DIAGNOSIS — Z79899 Other long term (current) drug therapy: Secondary | ICD-10-CM | POA: Diagnosis not present

## 2016-05-13 DIAGNOSIS — Z87891 Personal history of nicotine dependence: Secondary | ICD-10-CM | POA: Diagnosis not present

## 2016-05-13 DIAGNOSIS — R5383 Other fatigue: Secondary | ICD-10-CM | POA: Diagnosis not present

## 2016-05-13 DIAGNOSIS — I110 Hypertensive heart disease with heart failure: Secondary | ICD-10-CM | POA: Diagnosis not present

## 2016-05-13 DIAGNOSIS — Z8249 Family history of ischemic heart disease and other diseases of the circulatory system: Secondary | ICD-10-CM | POA: Diagnosis not present

## 2016-05-13 DIAGNOSIS — E119 Type 2 diabetes mellitus without complications: Secondary | ICD-10-CM | POA: Insufficient documentation

## 2016-05-13 DIAGNOSIS — Z882 Allergy status to sulfonamides status: Secondary | ICD-10-CM | POA: Insufficient documentation

## 2016-05-13 DIAGNOSIS — I482 Chronic atrial fibrillation: Secondary | ICD-10-CM | POA: Diagnosis not present

## 2016-05-13 DIAGNOSIS — Z833 Family history of diabetes mellitus: Secondary | ICD-10-CM | POA: Diagnosis not present

## 2016-05-13 DIAGNOSIS — Z880 Allergy status to penicillin: Secondary | ICD-10-CM | POA: Diagnosis not present

## 2016-05-13 DIAGNOSIS — I2510 Atherosclerotic heart disease of native coronary artery without angina pectoris: Secondary | ICD-10-CM

## 2016-05-13 DIAGNOSIS — G4733 Obstructive sleep apnea (adult) (pediatric): Secondary | ICD-10-CM | POA: Diagnosis not present

## 2016-05-13 DIAGNOSIS — Z7901 Long term (current) use of anticoagulants: Secondary | ICD-10-CM | POA: Insufficient documentation

## 2016-05-13 DIAGNOSIS — E785 Hyperlipidemia, unspecified: Secondary | ICD-10-CM | POA: Insufficient documentation

## 2016-05-13 DIAGNOSIS — Z95810 Presence of automatic (implantable) cardiac defibrillator: Secondary | ICD-10-CM | POA: Insufficient documentation

## 2016-05-13 DIAGNOSIS — I255 Ischemic cardiomyopathy: Secondary | ICD-10-CM | POA: Diagnosis not present

## 2016-05-13 DIAGNOSIS — Z86711 Personal history of pulmonary embolism: Secondary | ICD-10-CM | POA: Diagnosis not present

## 2016-05-13 DIAGNOSIS — Z7984 Long term (current) use of oral hypoglycemic drugs: Secondary | ICD-10-CM | POA: Diagnosis not present

## 2016-05-13 DIAGNOSIS — I5022 Chronic systolic (congestive) heart failure: Secondary | ICD-10-CM | POA: Insufficient documentation

## 2016-05-13 MED ORDER — SACUBITRIL-VALSARTAN 49-51 MG PO TABS: 1.0000 | ORAL_TABLET | Freq: Two times a day (BID) | ORAL | Status: DC

## 2016-05-13 NOTE — Patient Instructions (Signed)
INCREASE Entresto to 49/51 mg tablet twice daily.  Labs in 1 week.  Will schedule you for an echocardiogram at [facility] at the end of August. Address: [address] ([place]), [place], [place] [address]  Phone: [phone]  __________________________________________________________  __________________________________________________________  Follow up 3 months with Dr. Haroldine Laws.  Do the following things EVERYDAY: 1) Weigh yourself in the morning before breakfast. Write it down and keep it in a log. 2) Take your medicines as prescribed 3) Eat low salt foods-Limit salt (sodium) to 2000 mg per day.  4) Stay as active as you can everyday 5) Limit all fluids for the day to less than 2 liters

## 2016-05-13 NOTE — Progress Notes (Signed)
Patient ID: Carl Wood, male   DOB: 02/28/1941, 75 y.o.   MRN: [id number] Patient ID: Carl Wood, male   DOB: Feb 06, 1941, 75 y.o.   MRN: [id number]    Advanced Heart Failure Clinic Note   Referring Physician: Dr Yong Channel  Primary Care: Dr Yong Channel  Primary Cardiologist: EP: Dr Caryl Comes  Neurologists: Dr Leonie Man  HPI: Mr Atondo is a 75 year old with a history of MI 1983, ICM, chronic systolic heart failure s/p Medtronic single chamber ICD, CVA 2014, DM , VT, PE, ,htn, permanent A fib on Xarelto, former smoker 2014  referred to the HF clinic by Drs Caryl Comes and [facility] for worsening EF and increasing exercise intolerance.   Today he returns for HF follow up. Overall doing ok. Seems to be getting around more. Says his breathing is all right. Mild dyspnea with inclines. Denies PND/Orthopnea. Using CPAP nightly. CPAP report AHI 22->1 Weighing daily. Stable 214-215. Taking all medication. Not watching diet perfectly. Takes extra lasix rarely when needed.    ICD interrogation (done personally): fluid up until end of May. Now back to baseline. Activity level ~4hr. No VT/AF  06/2015 ECHO EF 20% Severely dilated mild TR,  12/2012 ECHO EF 30-35%   07/2015: k 4.0 Creatinine 0.86  11/2015: K 4.4, creatinine 0.91  RHC.LHC 01/20/2016 RA = 9 RV = 37/3/10 PA = 41/16 (25) PCW = 18 Fick cardiac output/index = 4.5/2.1 Ao = 115/55 (74) LV = 119/8/15 PVR = 1.5 WU SVR = 1141 FA sat = 95% PA sat = 59%, 64% Assessment: 1. Severe 1v CAD with high grade mid LAD lesion (anterior wall felt to be non-viable so treated medically)  2. Ischemic CM with EF 25-30% with akinesis of mid anterior wall and aneurysmal deformity of distal anterior wall and apex 3. Normal filling pressures with moderately reduced cardiac output  CPX 08/13/15 Resting HR: 65 Peak HR: 113  (76% age predicted max HR) BP rest: 106/60 BP peak: 130/60 Peak VO2: 14.9 (55.2% predicted peak VO2) VE/VCO2 slope: 27.9 OUES: 2.11 Peak RER:  0.95 Ventilatory Threshold: 11.0 (40.8% predicted or measured peak VO2) Peak RR 28 Peak Ventilation: 41.4 VE/MVV: 55.9% PETCO2 at peak: 37 O2pulse: 12  (71% predicted O2pulse)   Past Medical History  Diagnosis Date  . PE (pulmonary embolism)   . Colon polyps   . CHF (congestive heart failure) ([place])   . CAD (coronary artery disease)   . Diabetes mellitus   . Diverticulosis of colon   . Hyperlipidemia   . Hypertension   . MI (mitral incompetence)   . Nephrolithiasis   . V-tach ([place])   . NEPHROLITHIASIS 06/26/2008    Qualifier: Diagnosis of  By: Sherlynn Stalls, CMA, [place]    . DIVERTICULOSIS, COLON 10/16/2006    Qualifier: Diagnosis of  By: Leanne Chang MD, [place]    . OSA (obstructive sleep apnea) 02/19/2016    Moderate to severe OSA with an AHI of 25/hr and on CPAP at 8cm H2O  . Excessive daytime sleepiness 02/19/2016    Current Outpatient Prescriptions  Medication Sig Dispense Refill  . atorvastatin (LIPITOR) 80 MG tablet TAKE ONE TABLET BY MOUTH ONCE DAILY AS DIRECTED 30 tablet 5  . carvedilol (COREG) 6.25 MG tablet TAKE ONE TABLET BY MOUTH TWICE DAILY WITH FOOD 180 tablet 0  . digoxin (LANOXIN) 0.25 MG tablet TAKE ONE-HALF TABLET BY MOUTH ONCE DAILY 45 tablet 1  . ENTRESTO 24-26 MG TAKE ONE TABLET BY MOUTH TWICE DAILY 60 tablet 0  . furosemide (LASIX)  20 MG tablet Take 1 tablet (20 mg total) by mouth as needed (for wt gain of 3 lb in 24 hours). 30 tablet 3  . glimepiride (AMARYL) 2 MG tablet Take 1 tablet (2 mg total) by mouth daily before breakfast. 90 tablet 3  . metFORMIN (GLUCOPHAGE) 500 MG tablet Take 1 tablet (500 mg total) by mouth 2 (two) times daily with a meal. 60 tablet 5  . XARELTO 20 MG TABS tablet TAKE ONE TABLET BY MOUTH ONCE DAILY 30 tablet 5   No current facility-administered medications for this encounter.    Allergies  Allergen Reactions  . Penicillins Other (See Comments)    Patient passed out  . Sulfamethoxazole Rash      Social History   Social  History  . Marital Status: Married    Spouse Name: [name]  . Number of Children: 3  . Years of Education: 12   Occupational History  . Retired    Social History Main Topics  . Smoking status: Former Smoker -- 0.75 packs/day for 50 years    Types: Cigarettes    Quit date: 12/15/2012  . Smokeless tobacco: Never Used  . Alcohol Use: No  . Drug Use: No  . Sexual Activity: Not on file   Other Topics Concern  . Not on file   Social History Narrative   Married. 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 Colon cancer Father   . Diabetes Father   . Heart disease Father   . Bladder Cancer Mother   . Heart disease Mother     [name] Vitals:   05/13/16 1355  BP: 102/68  Pulse: 65  Resp: 18  Weight: 216 lb 8 oz (98.204 kg)  SpO2: 95%    PHYSICAL EXAM: General:  Elderly appearing. NAD HEENT: normal Neck: supple. no JVD. Carotids 2+ bilat; no bruits. No thyromegaly or nodule noted. Cor: PMI nondisplaced. Irregularly irregular. No M/G/R appreciated. R sided ICD (previous lead fx) Lungs: Coarse lung sounds, otherwise clear. Normal effort Abdomen: soft, NT, ND, no HSM. No bruits or masses. +BS. Back: Previous surgical scar Extremities: no cyanosis, clubbing, rash, Trace ankle edema  Neuro: alert & oriented x 3, cranial nerves grossly intact. moves all 4 extremities w/o difficulty. Affect pleasant.   ASSESSMENT & PLAN:  1. Chronic Systolic HF- ICM Medtronic ICD. NYHA II-III. ECHO 06/2015 LVEF 20%, Mild MR decreased from 30-35% in 2014. Optivol: Activity ~3 hours. Volume below threshold - NYHA II. Volume status stable.  Continue prn lasix. Continue carvedilol 6.25 mg BID and digoxin 0.125 mg - Increase entresto to 49/51 BID. Watch  closely with soft BP and hyperkalemia in the past.  - repeat echo. Labs in 1 week.  No spiro for now.  Will refer back to [employer] [facility].  - 2. OSA - Now using CPAP nightly.  3. Former smoker  - quit 2014 -4. Chronic A fib  - Rate controlled continue current dose of bb  - Continue Xarelto 20 mg daily.  5. Lower extremity fatigue : ABIs ok.  6. CAD- Severe 1V Cad - On statin, bb, xarelto.   Follow up in 3 months.  Shareena Nusz, Quillian Quince  05/13/2016   Patient seen and examined with  Darrick Grinder, NP. We discussed all aspects of the encounter. I agree with the assessment and plan as stated above.   Overall doing fairly well. I reviewed ICD diagnostics with him personally. Fluid was way up at end of February - now much better. He is not weighing regularly or taking any lasix. Encouraged him to weigh daily and use lasix 20mg  as needed. Will refer to cardiac rehab. Continue Xarelto for AF.   Rylynn Kobs,MD 2:16 PM

## 2016-05-13 NOTE — Addendum Note (Signed)
Encounter addended by: Effie Berkshire, RN on: 05/13/2016  2:36 PM<BR>     Documentation filed: Medications, Patient Instructions Section, Dx Association, Orders

## 2016-05-19 ENCOUNTER — Telehealth: Payer: Self-pay | Admitting: Family Medicine

## 2016-05-19 NOTE — Telephone Encounter (Addendum)
error 

## 2016-05-21 ENCOUNTER — Other Ambulatory Visit: Payer: Commercial Managed Care - HMO

## 2016-05-21 ENCOUNTER — Ambulatory Visit: Payer: Commercial Managed Care - HMO | Admitting: Family Medicine

## 2016-05-21 ENCOUNTER — Other Ambulatory Visit (INDEPENDENT_AMBULATORY_CARE_PROVIDER_SITE_OTHER): Payer: Commercial Managed Care - HMO

## 2016-05-21 ENCOUNTER — Encounter: Payer: Self-pay | Admitting: Family Medicine

## 2016-05-21 ENCOUNTER — Ambulatory Visit (INDEPENDENT_AMBULATORY_CARE_PROVIDER_SITE_OTHER): Payer: Commercial Managed Care - HMO | Admitting: Family Medicine

## 2016-05-21 VITALS — BP 102/80 | HR 76 | Temp 97.5°F | Ht 72.0 in | Wt 216.3 lb

## 2016-05-21 DIAGNOSIS — M545 Low back pain, unspecified: Secondary | ICD-10-CM

## 2016-05-21 DIAGNOSIS — I10 Essential (primary) hypertension: Secondary | ICD-10-CM

## 2016-05-21 LAB — BASIC METABOLIC PANEL
BUN: 17 mg/dL (ref 7–25)
CALCIUM: 9.4 mg/dL (ref 8.6–10.3)
CHLORIDE: 103 mmol/L (ref 98–110)
CO2: 27 mmol/L (ref 20–31)
Creat: 0.88 mg/dL (ref 0.70–1.18)
Glucose, Bld: 69 mg/dL (ref 65–99)
Potassium: 4.6 mmol/L (ref 3.5–5.3)
SODIUM: 140 mmol/L (ref 135–146)

## 2016-05-21 MED ORDER — CYCLOBENZAPRINE HCL 10 MG PO TABS: 10.0000 mg | ORAL_TABLET | Freq: Three times a day (TID) | ORAL | Status: DC | PRN

## 2016-05-21 NOTE — Progress Notes (Signed)
   Subjective:    Patient ID: Carl Wood, male    DOB: 04/22/1941, 75 y.o.   MRN: [id number]  HPI Here for 5 days of intermittent lower back pain. This started when he simply shifted his weight while sitting in a pew at church, and when he did he felt a sudden sharp twinge of pain in the right lower back. This has continued to cause pain and he gets muscle spasms in the lower back. Using heat with mixed results. No radiation to the legs. He has lumbar disc surgery many years ago.    Review of Systems  Constitutional: Negative.   Musculoskeletal: Positive for back pain.       Objective:   Physical Exam  Constitutional: He appears well-developed and well-nourished. No distress.  Musculoskeletal:  He is not tender in the lower back, but ROM is limited by pain. Some spasm is present. SLR are negative           Assessment & Plan:  Low back pain. Use ES Tylenol and Flexeril prn. Recheck prn.  Laurey Morale, MD

## 2016-05-21 NOTE — Progress Notes (Signed)
Pre visit review using our clinic review tool, if applicable. No additional management support is needed unless otherwise documented below in the visit note. 

## 2016-05-22 LAB — BRAIN NATRIURETIC PEPTIDE: BRAIN NATRIURETIC PEPTIDE: 97.2 pg/mL (ref <100)

## 2016-06-01 ENCOUNTER — Encounter: Payer: Self-pay | Admitting: Internal Medicine

## 2016-06-04 DIAGNOSIS — G4733 Obstructive sleep apnea (adult) (pediatric): Secondary | ICD-10-CM | POA: Diagnosis not present

## 2016-06-07 ENCOUNTER — Encounter: Payer: Self-pay | Admitting: Internal Medicine

## 2016-06-07 ENCOUNTER — Ambulatory Visit (INDEPENDENT_AMBULATORY_CARE_PROVIDER_SITE_OTHER): Payer: Commercial Managed Care - HMO | Admitting: Internal Medicine

## 2016-06-07 VITALS — BP 104/68 | HR 72 | Ht 72.0 in | Wt 216.6 lb

## 2016-06-07 DIAGNOSIS — Z95810 Presence of automatic (implantable) cardiac defibrillator: Secondary | ICD-10-CM

## 2016-06-07 DIAGNOSIS — I482 Chronic atrial fibrillation: Secondary | ICD-10-CM

## 2016-06-07 DIAGNOSIS — I4821 Permanent atrial fibrillation: Secondary | ICD-10-CM

## 2016-06-07 DIAGNOSIS — I255 Ischemic cardiomyopathy: Secondary | ICD-10-CM | POA: Diagnosis not present

## 2016-06-07 DIAGNOSIS — I5022 Chronic systolic (congestive) heart failure: Secondary | ICD-10-CM | POA: Diagnosis not present

## 2016-06-07 LAB — DIGOXIN LEVEL: DIGOXIN LVL: 0.5 ug/L — AB (ref 0.8–2.0)

## 2016-06-07 LAB — CUP PACEART INCLINIC DEVICE CHECK
Implantable Lead Implant Date: 20010723
Implantable Lead Location: 753860
Lead Channel Pacing Threshold Amplitude: 1.5 V
Lead Channel Sensing Intrinsic Amplitude: 9.8 mV
MDC IDC LEAD MODEL: 6943
MDC IDC MSMT LEADCHNL RV PACING THRESHOLD PULSEWIDTH: 0.8 ms
MDC IDC SESS DTM: 20170703125628

## 2016-06-07 NOTE — Progress Notes (Signed)
Patient Care Team: Marin Olp, MD as PCP - General (Family Medicine)   HPI  Carl Wood is a 75 y.o. male s seen in followup for ischemic heart disease with ICD implanted for ventricular tachycardia. He has history of bypass grafting and depressed left ventricular function. He has a history of recurrent ventricular tachycardia--most recently polymorphic in september 2101 associated with low-normal magnesium.  He underwent a Myoview scanning demonstrated ejection fraction of 27% without ischemia but with a large LAD infarct.   He has permanent atrial fibrillation.   Echocardiogram 1/14 EF 38-35% 06/2015 ECHO EF 20% Severely dilated mild TR,   2/17  Cath. Severe 1v CAD with high grade mid LAD lesion (anterior wall felt to be non-viable so treated medically)  2. Ischemic CM with EF 25-30% with akinesis of mid anterior wall and aneurysmal deformity of distal anterior wall and apex  The patient  has stable shortness of breath; he no chest pain and no edema He had a stroke in Jan 2014 from which he has making a recovery  Sleep study 12/16 was negative  No bleeding issues.  Past Medical History  Diagnosis Date  . PE (pulmonary embolism)   . Colon polyps   . CHF (congestive heart failure) ([place])   . CAD (coronary artery disease)   . Diabetes mellitus   . Diverticulosis of colon   . Hyperlipidemia   . Hypertension   . MI (mitral incompetence)   . Nephrolithiasis   . V-tach ([place])   . NEPHROLITHIASIS 06/26/2008    Qualifier: Diagnosis of  By: Sherlynn Stalls, CMA, [place]    . DIVERTICULOSIS, COLON 10/16/2006    Qualifier: Diagnosis of  By: Leanne Chang MD, [place]    . OSA (obstructive sleep apnea) 02/19/2016    Moderate to severe OSA with an AHI of 25/hr and on CPAP at 8cm H2O  . Excessive daytime sleepiness 02/19/2016    Past Surgical History  Procedure Laterality Date  . Back surgery    . Cardiac defibrillator placement      medtronic virtuoso  . Basal cell carcinoma  excision      nose  . Knee arthroplasty    . Cholecystectomy    . Colonoscopy  12/11/2012    Procedure: COLONOSCOPY;  Surgeon: Inda Castle, MD;  Location: [facility];  Service: Endoscopy;  Laterality: N/A;  . Cardiac catheterization N/A 01/20/2016    Procedure: Right/Left Heart Cath and Coronary Angiography;  Surgeon: Jolaine Artist, MD;  Location: [place] [facility];  Service: Cardiovascular;  Laterality: N/A;    Current Outpatient Prescriptions  Medication Sig Dispense Refill  . atorvastatin (LIPITOR) 80 MG tablet TAKE ONE TABLET BY MOUTH ONCE DAILY AS DIRECTED 30 tablet 5  . carvedilol (COREG) 6.25 MG tablet TAKE ONE TABLET BY MOUTH TWICE DAILY WITH FOOD 180 tablet 0  . cyclobenzaprine (FLEXERIL) 10 MG tablet Take 1 tablet (10 mg total) by mouth 3 (three) times daily as needed for muscle spasms. 60 tablet 0  . digoxin (LANOXIN) 0.25 MG tablet TAKE ONE-HALF TABLET BY MOUTH ONCE DAILY 45 tablet 1  . furosemide (LASIX) 20 MG tablet Take 1 tablet (20 mg total) by mouth as needed (for wt gain of 3 lb in 24 hours). 30 tablet 3  . glimepiride (AMARYL) 2 MG tablet Take 1 tablet (2 mg total) by mouth daily before breakfast. 90 tablet 3  . metFORMIN (GLUCOPHAGE) 500 MG tablet Take 1 tablet (500 mg total) by mouth 2 (  two) times daily with a meal. 60 tablet 5  . sacubitril-valsartan (ENTRESTO) 49-51 MG Take 1 tablet by mouth 2 (two) times daily. 60 tablet 6  . XARELTO 20 MG TABS tablet TAKE ONE TABLET BY MOUTH ONCE DAILY 30 tablet 5   No current facility-administered medications for this visit.    Allergies  Allergen Reactions  . Penicillins Other (See Comments)    Patient passed out  . Sulfamethoxazole Rash    Review of Systems negative except from HPI and PMH  Physical Exam BP 104/68 mmHg  Pulse 72  Ht 6' (1.829 m)  Wt 216 lb 9.6 oz (98.249 kg)  BMI 29.37 kg/m2  SpO2 95% Well developed and well nourished in no acute distress HENT normal E scleral and icterus clear Neck  Supple JVP flat; carotids brisk and full Clear to ausculation Irregular rate and rhythm, no murmurs gallops or rub Soft with active bowel sounds No clubbing cyanosis  Edema Alert and oriented, grossly normal motor and sensory function Skin Warm and Dry  ECG demonstrates atrial fibrillation at 72 Intervals-/16/46 IVCD with a QS in lead 1 and L as well as a biphasic QRS in lead V6 not seen in the anterior precordium  Old tracings  Assessment and  Plan  Atrial fibrillation-permanent  IVCD  Ischemic cardiomyopathy prior bypass surgery  ICD-Medtronic The patient's device was interrogated.  The information was reviewed. No changes were made in the programming.    we will continue him on Rivaroxaban   While he does not have strict left bundle branch block, a recent paper looking at some dividing patients with IVCD has suggested that a long Q- LV interval was predicted by notching of the QRS complexes in the anterior precordium. I will discuss this with Dr. Reine Just.  We will check a dig level.  We spent more than 50% of our >25 min visit in face to face counseling regarding the above

## 2016-06-07 NOTE — Patient Instructions (Addendum)
Medication Instructions: - Your physician recommends that you continue on your current medications as directed. Please refer to the Current Medication list given to you today.  Labwork: - Your physician recommends that you have lab work today: Digoxin level  Procedures/Testing: - none  Follow-Up: - Remote monitoring is used to monitor your Pacemaker of ICD from home. This monitoring reduces the number of office visits required to check your device to one time per year. It allows [place] to keep an eye on the functioning of your device to ensure it is working properly. You are scheduled for a device check from home on 09/06/16. You may send your transmission at any time that day. If you have a wireless device, the transmission will be sent automatically. After your physician reviews your transmission, you will receive a postcard with your next transmission date.  - Your physician wants you to follow-up in: 1 year with Carl Marshall, NP for Dr. Caryl Comes. You will receive a reminder letter in the mail two months in advance. If you don't receive a letter, please call our office to schedule the follow-up appointment.  Any Additional Special Instructions Will Be Listed Below (If Applicable).     If you need a refill on your cardiac medications before your next appointment, please call your pharmacy.

## 2016-06-21 ENCOUNTER — Other Ambulatory Visit: Payer: Self-pay | Admitting: Family Medicine

## 2016-07-04 DIAGNOSIS — G4733 Obstructive sleep apnea (adult) (pediatric): Secondary | ICD-10-CM | POA: Diagnosis not present

## 2016-07-19 ENCOUNTER — Ambulatory Visit (INDEPENDENT_AMBULATORY_CARE_PROVIDER_SITE_OTHER): Payer: Commercial Managed Care - HMO | Admitting: Family Medicine

## 2016-07-19 ENCOUNTER — Encounter: Payer: Self-pay | Admitting: Family Medicine

## 2016-07-19 VITALS — BP 110/62 | HR 72 | Temp 97.4°F | Wt 218.4 lb

## 2016-07-19 DIAGNOSIS — Z794 Long term (current) use of insulin: Secondary | ICD-10-CM | POA: Diagnosis not present

## 2016-07-19 DIAGNOSIS — E119 Type 2 diabetes mellitus without complications: Secondary | ICD-10-CM | POA: Diagnosis not present

## 2016-07-19 DIAGNOSIS — E785 Hyperlipidemia, unspecified: Secondary | ICD-10-CM | POA: Diagnosis not present

## 2016-07-19 DIAGNOSIS — Z87891 Personal history of nicotine dependence: Secondary | ICD-10-CM | POA: Diagnosis not present

## 2016-07-19 DIAGNOSIS — I10 Essential (primary) hypertension: Secondary | ICD-10-CM | POA: Diagnosis not present

## 2016-07-19 LAB — POCT GLYCOSYLATED HEMOGLOBIN (HGB A1C): HEMOGLOBIN A1C: 7

## 2016-07-19 NOTE — Assessment & Plan Note (Signed)
S: well controlled on atorvastatin 80mg - fortunately ldl came just under 70. No myalgias.  Lab Results  Component Value Date   CHOL 112 03/18/2016   HDL 29.30 (L) 03/18/2016   LDLCALC 69 03/18/2016   LDLDIRECT 71.0 03/17/2015   TRIG 66.0 03/18/2016   CHOLHDL 4 03/18/2016   A/P: continue current therapy

## 2016-07-19 NOTE — Progress Notes (Signed)
Pre visit review using our clinic review tool, if applicable. No additional management support is needed unless otherwise documented below in the visit note. 

## 2016-07-19 NOTE — Addendum Note (Signed)
Addended by: Lucianne Lei M on: 07/19/2016 10:31 AM   Modules accepted: Orders

## 2016-07-19 NOTE — Assessment & Plan Note (Signed)
S: hopeful improved controlled. On metformin 500mg  BID- did not titrate up due to flushing. Added amaryl 2mg .  CBGs- mid 130s for most part. No low blood sugars Exercise and diet- weight up 2 lbs Lab Results  Component Value Date   HGBA1C 7.8 (H) 03/18/2016   HGBA1C 8.4 (H) 11/18/2015   HGBA1C 7.5 (H) 07/28/2015   A/P: much improved with a1c 7- continue current therapy with 4-6 month follow up

## 2016-07-19 NOTE — Patient Instructions (Addendum)
No change in medication   a1c down to 7.0 which is great news. Continue metformin and glimepiride at same doses  We will call you within a week about your referral to lung cancer screening program. If you do not hear within 2 weeks, give [place] a call.

## 2016-07-19 NOTE — Progress Notes (Signed)
Subjective:  Carl Wood is a 75 y.o. year old very pleasant male patient who presents for/with See problem oriented charting ROS- no hypoglycemia. No worsening shortness of breath. No chest pain. No nausea/vomiting. .see any ROS included in HPI as well.   Past Medical History-  Patient Active Problem List   Diagnosis Date Noted  . Chronic systolic heart failure ([place]) 08/06/2015    Priority: High  . Former smoker 11/15/2014    Priority: High  . Atrial fibrillation ([place]) 12/19/2012    Priority: High  . CVA (cerebral infarction) 12/19/2012    Priority: High  .  ventricular tachycardia-non sustained  02/17/2012    Priority: High  . Cardiomyopathy, ischemic 09/23/2009    Priority: High  . Diabetes mellitus type II, controlled ([place]) 10/16/2006    Priority: High  . CAD (coronary artery disease) 10/16/2006    Priority: High  . PULMONARY EMBOLISM, HX OF 10/16/2006    Priority: High  . OSA (obstructive sleep apnea) 02/19/2016    Priority: Medium  . Carotid artery stenosis 03/17/2015    Priority: Medium  . Automatic implantable cardiac defibrillator  medtronic 02/17/2012    Priority: Medium  . History of skin cancer 07/05/2007    Priority: Medium  . Hyperlipidemia 10/16/2006    Priority: Medium  . Essential hypertension 10/16/2006    Priority: Medium  . LBBB (left bundle branch block) 08/06/2015    Priority: Low  . Actinic keratosis 11/15/2014    Priority: Low  . Trigger finger, acquired 03/14/2014    Priority: Low  . Benign neoplasm of colon 12/11/2012    Priority: Low  . PSEUDOGOUT 08/23/2008    Priority: Low  . Excessive daytime sleepiness 02/19/2016    Medications- reviewed and updated Current Outpatient Prescriptions  Medication Sig Dispense Refill  . atorvastatin (LIPITOR) 80 MG tablet TAKE ONE TABLET BY MOUTH ONCE DAILY AS DIRECTED 30 tablet 5  . carvedilol (COREG) 6.25 MG tablet TAKE ONE TABLET BY MOUTH TWICE DAILY WITH FOOD 180 tablet 0  . digoxin (LANOXIN)  0.25 MG tablet TAKE ONE-HALF TABLET BY MOUTH ONCE DAILY 45 tablet 1  . furosemide (LASIX) 20 MG tablet Take 1 tablet (20 mg total) by mouth as needed (for wt gain of 3 lb in 24 hours). 30 tablet 3  . glimepiride (AMARYL) 2 MG tablet Take 1 tablet (2 mg total) by mouth daily before breakfast. 90 tablet 3  . metFORMIN (GLUCOPHAGE) 500 MG tablet TAKE ONE TABLET BY MOUTH TWICE DAILY WITH MEALS 60 tablet 5  . sacubitril-valsartan (ENTRESTO) 49-51 MG Take 1 tablet by mouth 2 (two) times daily. 60 tablet 6  . XARELTO 20 MG TABS tablet TAKE ONE TABLET BY MOUTH ONCE DAILY 30 tablet 5   No current facility-administered medications for this visit.     Objective: BP 110/62 (BP Location: Left Arm, Patient Position: Sitting, Cuff Size: Normal)   Pulse 72   Temp 97.4 F (36.3 C) (Oral)   Wt 218 lb 6.4 oz (99.1 kg)   SpO2 94%   BMI 29.62 kg/m  Gen: NAD, resting comfortably CV: RRR no murmurs rubs or gallops Lungs: CTAB no crackles, wheeze, rhonchi Ext: no edema Skin: warm, dry, no rash Neuro: grossly normal, moves all extremities  Assessment/Plan:     Hypertension S: controlled. On crvedilol and entresto BP Readings from Last 3 Encounters:  07/19/16 110/62  06/07/16 104/68  05/21/16 102/80  A/P:Continue current medications  Right sided low back pain S: caught real bad in June-  used muscle relaxants no pain medicine. Came back in last few weeks after resolving. Hurts to lift legs up but no weakness A/P: advised, PT, wants to hold off for now but will call if recurs. We discussed likely arthritic in origin but supporting surrounding muscles with strengthening would likely help   Former smoker - Plan: Ambulatory Referral for Lung Cancer Scre 37.5 pack years- quit 2013-2014. Opts in to screening    Diabetes mellitus type II, controlled ([place]) S: hopeful improved controlled. On metformin 500mg  BID- did not titrate up due to flushing. Added amaryl 2mg .  CBGs- mid 130s for most part. No low  blood sugars Exercise and diet- weight up 2 lbs Lab Results  Component Value Date   HGBA1C 7.8 (H) 03/18/2016   HGBA1C 8.4 (H) 11/18/2015   HGBA1C 7.5 (H) 07/28/2015   A/P: much improved with a1c 7- continue current therapy with 4-6 month follow up  Hyperlipidemia S: well controlled on atorvastatin 80mg - fortunately ldl came just under 70. No myalgias.  Lab Results  Component Value Date   CHOL 112 03/18/2016   HDL 29.30 (L) 03/18/2016   LDLCALC 69 03/18/2016   LDLDIRECT 71.0 03/17/2015   TRIG 66.0 03/18/2016   CHOLHDL 4 03/18/2016   A/P: continue current therapy  4-6 month given improved DM control if sugars stable  Orders Placed This Encounter  Procedures  . Ambulatory Referral for Lung Cancer Scre    Referral Priority:   Routine    Referral Type:   Consultation    Referral Reason:   Specialty Services Required    Number of Visits Requested:   1   Return precautions advised.  [name], MD

## 2016-07-21 ENCOUNTER — Other Ambulatory Visit: Payer: Self-pay | Admitting: Acute Care

## 2016-07-21 DIAGNOSIS — Z87891 Personal history of nicotine dependence: Secondary | ICD-10-CM

## 2016-07-27 ENCOUNTER — Other Ambulatory Visit: Payer: Self-pay | Admitting: Internal Medicine

## 2016-08-04 DIAGNOSIS — G4733 Obstructive sleep apnea (adult) (pediatric): Secondary | ICD-10-CM | POA: Diagnosis not present

## 2016-08-12 ENCOUNTER — Encounter: Payer: Commercial Managed Care - HMO | Admitting: Acute Care

## 2016-08-13 ENCOUNTER — Ambulatory Visit (HOSPITAL_COMMUNITY): Payer: Commercial Managed Care - HMO

## 2016-08-13 ENCOUNTER — Encounter (HOSPITAL_COMMUNITY): Payer: Commercial Managed Care - HMO | Admitting: Internal Medicine

## 2016-08-16 ENCOUNTER — Ambulatory Visit (INDEPENDENT_AMBULATORY_CARE_PROVIDER_SITE_OTHER)
Admission: RE | Admit: 2016-08-16 | Discharge: 2016-08-16 | Disposition: A | Discharge status: Home / Self Care | Payer: Commercial Managed Care - HMO | Source: Ambulatory Visit | Attending: Acute Care | Admitting: Acute Care

## 2016-08-16 ENCOUNTER — Ambulatory Visit (INDEPENDENT_AMBULATORY_CARE_PROVIDER_SITE_OTHER): Payer: Commercial Managed Care - HMO | Admitting: Acute Care

## 2016-08-16 ENCOUNTER — Encounter: Payer: Self-pay | Admitting: Acute Care

## 2016-08-16 DIAGNOSIS — Z87891 Personal history of nicotine dependence: Secondary | ICD-10-CM

## 2016-08-16 NOTE — Progress Notes (Signed)
Shared Decision Making Visit Lung Cancer Screening Program [phone])   Eligibility:  Age 75 y.o.  Pack Years Smoking History Calculation 50 pack years ++ (# packs/per year x # years smoked)  Recent History of coughing up blood  No  Unexplained weight loss? No ( >Than 15 pounds within the last 6 months )  Prior History Lung / other cancer No (Diagnosis within the last 5 years already requiring surveillance chest CT Scans).  Smoking Status Current Smoker  Former Smokers: Years since quit: 3 years  Quit Date: 12/15/2012  Visit Components:  Discussion included one or more decision making aids. yes  Discussion included risk/benefits of screening. yes  Discussion included potential follow up diagnostic testing for abnormal scans. yes  Discussion included meaning and risk of over diagnosis. yes  Discussion included meaning and risk of False Positives. yes  Discussion included meaning of total radiation exposure. yes  Counseling Included:  Importance of adherence to annual lung cancer LDCT screening. yes  Impact of comorbidities on ability to participate in the program. yes  Ability and willingness to under diagnostic treatment. yes  Smoking Cessation Counseling:  Current Smokers:   Discussed importance of smoking cessation. NA: Former smoker  Information about tobacco cessation classes and interventions provided to patient. yes  Patient provided with "ticket" for LDCT Scan. yes  Symptomatic Patient. no  CounselingNA  Diagnosis Code: Tobacco Use Z72.0  Asymptomatic Patient yes  Counseling (Intermediate counseling: > three minutes counseling) [id number]  Former Smokers:   Discussed the importance of maintaining cigarette abstinence. yes  Diagnosis Code: Personal History of Nicotine Dependence. [id number]  Information about tobacco cessation classes and interventions provided to patient. Yes  Patient provided with "ticket" for LDCT Scan. yes  Written Order for  Lung Cancer Screening with LDCT placed in Epic. Yes (CT Chest Lung Cancer Screening Low Dose W/O CM) [id number] Z12.2-Screening of respiratory organs Z87.891-Personal history of nicotine dependence   I spent 20 minutes of face to face time with Mr. Slomka discussing the risks and benefits of lung cancer screening. We viewed a power point together that explained in detail the above noted topics. We took the time to pause the power point at intervals to allow for questions to be asked and answered to ensure understanding. We discussed that he had taken the single most powerful action possible to decrease hisrisk of developing lung cancer when he quit smoking. I counseled him to remain smoke free, and to contact me if he ever had the desire to smoke again so that I can provide resources and tools to help support the effort to remain smoke free. We discussed the time and location of the scan, and that either [place] or I will call with the results within  24-48 hours of receiving them. Mr. Barefield has my card and contact information in the event he needs to speak with me, in addition to a copy of the power point we reviewed as a resource.He  verbalized understanding of all of the above and had no further questions upon leaving the office.    Magdalen Spatz, NP 08/16/2016

## 2016-08-17 ENCOUNTER — Telehealth: Payer: Self-pay | Admitting: Acute Care

## 2016-08-17 DIAGNOSIS — Z87891 Personal history of nicotine dependence: Secondary | ICD-10-CM

## 2016-08-17 NOTE — Telephone Encounter (Signed)
Pt made aware that SD in in clinic and will return his call after clinic is complete. Pt voiced understanding, and had no further questions or concerns.  SG please advise.

## 2016-08-17 NOTE — Telephone Encounter (Signed)
I called Mr. Doren to give him his scan results. There was no answer. I have requested he call the office for his results with the contact information. I will await his return call.

## 2016-08-17 NOTE — Telephone Encounter (Signed)
Mr. Carl Wood returned my call. I explained that his scan was read as a Lung RADS 2: nodules that are benign in appearance and behavior with a very low likelihood of becoming a clinically active cancer due to size or lack of growth. Recommendation per radiology is for a repeat LDCT in 12 months.I told him we will order and schedule the annual scan in Sept. 2018. I also told him that the scan indicated Emphysema, which he was aware of, and coronary artery calcification. He is currently being treated with a statin by his PCP. He verbalized understanding of the above and had no further questions.I will fax a copy of the scan to the patients PCP.

## 2016-08-22 NOTE — Progress Notes (Signed)
Cardiology Office Note    Date:  08/24/2016   ID:  LUISANTONIO Wood, DOB 1941-08-19, MRN [id number]  PCP:  [name], MD  Cardiologist:  Fransico Him, MD   Chief Complaint  Patient presents with  . Sleep Apnea  . Hypertension    History of Present Illness:  Carl Wood is a 75 y.o. male male who presents for today for followup of OSA.  He has moderate to severe OSA with an AHI of 25.2/hr and is on CPAP at 8cm H2O.  He is tolerating his CPAP device without any problems.  He tolerates hisl mask.   He feels the pressure is adequate.  Since going on CPAP, he feels more rested in the am but says that he still has daytime sleepiness.  He gets up every 2-3 hours to urinate so his sleep is disrupted. He has no mouth dryness or nasal congestion.    Past Medical History:  Diagnosis Date  . CAD (coronary artery disease)   . CHF (congestive heart failure) ([place])   . Chronic atrial fibrillation ([place])   . Colon polyps   . Diabetes mellitus   . Diverticulosis of colon   . DIVERTICULOSIS, COLON 10/16/2006   Qualifier: Diagnosis of  By: Leanne Chang MD, [place]    . Excessive daytime sleepiness 02/19/2016  . Hyperlipidemia   . Hypertension   . MI (mitral incompetence)   . Nephrolithiasis   . NEPHROLITHIASIS 06/26/2008   Qualifier: Diagnosis of  By: Sherlynn Stalls, CMA, [place]    . OSA (obstructive sleep apnea) 02/19/2016   Moderate to severe OSA with an AHI of 25/hr and on CPAP at 8cm H2O  . PE (pulmonary embolism)   . V-tach [facility])     Past Surgical History:  Procedure Laterality Date  . BACK SURGERY    . BASAL CELL CARCINOMA EXCISION     nose  . CARDIAC CATHETERIZATION N/A 01/20/2016   Procedure: Right/Left Heart Cath and Coronary Angiography;  Surgeon: Jolaine Artist, MD;  Location: [place] [facility];  Service: Cardiovascular;  Laterality: N/A;  . CARDIAC DEFIBRILLATOR PLACEMENT     medtronic virtuoso  . CHOLECYSTECTOMY    . COLONOSCOPY  12/11/2012   Procedure: COLONOSCOPY;  Surgeon:  Inda Castle, MD;  Location: [facility];  Service: Endoscopy;  Laterality: N/A;  . KNEE ARTHROPLASTY      Current Medications: Outpatient Medications Prior to Visit  Medication Sig Dispense Refill  . atorvastatin (LIPITOR) 80 MG tablet TAKE ONE TABLET BY MOUTH ONCE DAILY AS DIRECTED 30 tablet 5  . carvedilol (COREG) 6.25 MG tablet TAKE ONE TABLET BY MOUTH TWICE DAILY WITH MEALS 180 tablet 3  . digoxin (LANOXIN) 0.25 MG tablet TAKE ONE-HALF TABLET BY MOUTH ONCE DAILY 45 tablet 1  . furosemide (LASIX) 20 MG tablet Take 1 tablet (20 mg total) by mouth as needed (for wt gain of 3 lb in 24 hours). 30 tablet 3  . glimepiride (AMARYL) 2 MG tablet Take 1 tablet (2 mg total) by mouth daily before breakfast. 90 tablet 3  . metFORMIN (GLUCOPHAGE) 500 MG tablet TAKE ONE TABLET BY MOUTH TWICE DAILY WITH MEALS 60 tablet 5  . sacubitril-valsartan (ENTRESTO) 49-51 MG Take 1 tablet by mouth 2 (two) times daily. 60 tablet 6  . XARELTO 20 MG TABS tablet TAKE ONE TABLET BY MOUTH ONCE DAILY 30 tablet 5   No facility-administered medications prior to visit.      Allergies:   Penicillins and Sulfamethoxazole   Social  History   Social History  . Marital status: Married    Spouse name: [name]  . Number of children: 3  . Years of education: 12   Occupational History  . Retired Retired   Social History Main Topics  . Smoking status: Former Smoker    Packs/day: 1.00    Years: 50.00    Types: Cigarettes    Quit date: 12/15/2012  . Smokeless tobacco: Never Used  . Alcohol use No  . Drug use: No  . Sexual activity: Not Asked   Other Topics Concern  . None   Social History Narrative   Married. 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   ROS:   Please see the history of present illness.    ROS All other systems reviewed and are negative.  No flowsheet data found.     PHYSICAL EXAM:   VS:  BP 110/60   Pulse (!) 55   Ht 6' (1.829 m)   Wt 221 lb (100.2 kg)   SpO2 97%   BMI 29.97 kg/m    GEN: Well nourished, well developed, in no acute distress  HEENT: normal  Neck: no JVD, carotid bruits, or masses Cardiac: irregulary irregular ; no murmurs, rubs, or gallops,no edema.  Intact distal pulses bilaterally.  Respiratory:  clear to auscultation bilaterally, normal work of breathing GI: soft, nontender, nondistended, + BS MS: no deformity or atrophy  Skin: warm and dry, no rash Neuro:  Alert and Oriented x 3, Strength and sensation are intact Psych: euthymic mood, full affect  Wt Readings from Last 3 Encounters:  08/24/16 221 lb (100.2 kg)  07/19/16 218 lb 6.4 oz (99.1 kg)  06/07/16 216 lb 9.6 oz (98.2 kg)      Studies/Labs Reviewed:   EKG:  EKG is ordered today.  The ekg ordered today demonstrates   Recent Labs: 03/18/2016: ALT 19; Hemoglobin 14.4; Platelets 224.0 05/21/2016: Brain Natriuretic Peptide 97.2; BUN 17; Creat 0.88; Potassium 4.6; Sodium 140   Lipid Panel    Component Value Date/Time   CHOL 112 03/18/2016 1024   TRIG 66.0 03/18/2016 1024   TRIG 53 10/10/2006 1113   HDL 29.30 (L) 03/18/2016 1024   CHOLHDL 4 03/18/2016 1024   VLDL 13.2 03/18/2016 1024   LDLCALC 69 03/18/2016 1024   LDLDIRECT 71.0 03/17/2015 0942    Additional studies/ records that were reviewed today include:  none    ASSESSMENT:    1. OSA (obstructive sleep apnea)   2. Essential hypertension   3. Chronic atrial fibrillation (HCC)      PLAN:  In order of problems listed above:  OSA - the patient is tolerating PAP therapy well without any problems. The PAP download was reviewed today and showed an  AHI of 1.6/hr on 8 cm H2O with 100% compliance in using more than 4 hours nightly.  The patient has been using and benefiting from CPAP use and will continue to benefit from therapy.  2.   HTN - BP controlled on current meds. He will continue on BB and ARB.    Medication Adjustments/Labs and Tests Ordered:  Current medicines are reviewed at length with the patient today.  Concerns regarding medicines are outlined above.  Medication changes, Labs and Tests ordered today are listed in the Patient Instructions below.  There are no Patient Instructions on file for this visit.   Signed, Fransico Him, MD  08/24/2016 10:51 AM    [address], [place], [place]  [address] Phone: [phone]; Fax: [phone]

## 2016-08-24 ENCOUNTER — Encounter: Payer: Self-pay | Admitting: Cardiology

## 2016-08-24 ENCOUNTER — Ambulatory Visit (INDEPENDENT_AMBULATORY_CARE_PROVIDER_SITE_OTHER): Payer: Commercial Managed Care - HMO | Admitting: Cardiology

## 2016-08-24 ENCOUNTER — Telehealth (HOSPITAL_COMMUNITY): Payer: Self-pay | Admitting: Pharmacist

## 2016-08-24 VITALS — BP 110/60 | HR 55 | Ht 72.0 in | Wt 221.0 lb

## 2016-08-24 DIAGNOSIS — I482 Chronic atrial fibrillation, unspecified: Secondary | ICD-10-CM | POA: Insufficient documentation

## 2016-08-24 DIAGNOSIS — G4733 Obstructive sleep apnea (adult) (pediatric): Secondary | ICD-10-CM

## 2016-08-24 DIAGNOSIS — I10 Essential (primary) hypertension: Secondary | ICD-10-CM | POA: Diagnosis not present

## 2016-08-24 NOTE — Telephone Encounter (Addendum)
Mr. Caliendo expressed concern with the copay cost of his Entresto. Have enrolled him in [employer] foundation so that he will have $800 toward his copay costs through 08/23/2016. Will relay info to [employer] and they verified $0 copay.   Billing ID: [id number] Person Code: [employer]: [id number] BIN: [id number] PCN for Part D: [id number]    Ruta Hinds. Velva Harman, PharmD, BCPS, CPP Clinical Pharmacist Pager: [phone] Phone: [phone] 08/24/2016 1:39 PM

## 2016-08-24 NOTE — Patient Instructions (Signed)

## 2016-09-02 ENCOUNTER — Encounter: Payer: Self-pay | Admitting: Cardiology

## 2016-09-04 DIAGNOSIS — G4733 Obstructive sleep apnea (adult) (pediatric): Secondary | ICD-10-CM | POA: Diagnosis not present

## 2016-09-06 ENCOUNTER — Telehealth: Payer: Self-pay | Admitting: Family Medicine

## 2016-09-06 ENCOUNTER — Ambulatory Visit (INDEPENDENT_AMBULATORY_CARE_PROVIDER_SITE_OTHER): Payer: Commercial Managed Care - HMO | Admitting: *Deleted

## 2016-09-06 DIAGNOSIS — I255 Ischemic cardiomyopathy: Secondary | ICD-10-CM

## 2016-09-06 NOTE — Progress Notes (Signed)
Remote ICD transmission.   

## 2016-09-06 NOTE — Telephone Encounter (Signed)
Patient is requesting a [facility] referral to [facility]. Has an appt set for 09/27/2016

## 2016-09-07 DIAGNOSIS — G4733 Obstructive sleep apnea (adult) (pediatric): Secondary | ICD-10-CM | POA: Diagnosis not present

## 2016-09-07 LAB — CUP PACEART REMOTE DEVICE CHECK
Battery Voltage: 2.64 V
HighPow Impedance: 79 Ohm
Implantable Lead Implant Date: 20010723
Implantable Lead Model: 6943
Lead Channel Setting Pacing Amplitude: 2.5 V
Lead Channel Setting Pacing Pulse Width: 0.8 ms
MDC IDC LEAD LOCATION: 753860
MDC IDC MSMT LEADCHNL RV IMPEDANCE VALUE: 472 Ohm
MDC IDC SESS DTM: 20171002083629
MDC IDC SET LEADCHNL RV SENSING SENSITIVITY: 0.3 mV
MDC IDC STAT BRADY RV PERCENT PACED: 1.66 %

## 2016-09-08 ENCOUNTER — Encounter: Payer: Self-pay | Admitting: Cardiology

## 2016-09-09 ENCOUNTER — Ambulatory Visit (HOSPITAL_COMMUNITY)
Admission: RE | Admit: 2016-09-09 | Discharge: 2016-09-09 | Disposition: A | Discharge status: Home / Self Care | Payer: Commercial Managed Care - HMO | Source: Ambulatory Visit | Attending: Internal Medicine | Admitting: Internal Medicine

## 2016-09-09 ENCOUNTER — Encounter (HOSPITAL_COMMUNITY): Payer: Self-pay | Admitting: Internal Medicine

## 2016-09-09 ENCOUNTER — Ambulatory Visit (HOSPITAL_BASED_OUTPATIENT_CLINIC_OR_DEPARTMENT_OTHER)
Admission: RE | Admit: 2016-09-09 | Discharge: 2016-09-09 | Disposition: A | Discharge status: Home / Self Care | Payer: Commercial Managed Care - HMO | Source: Ambulatory Visit | Attending: Internal Medicine | Admitting: Internal Medicine

## 2016-09-09 VITALS — BP 120/66 | HR 60 | Resp 18 | Wt 219.5 lb

## 2016-09-09 DIAGNOSIS — I2510 Atherosclerotic heart disease of native coronary artery without angina pectoris: Secondary | ICD-10-CM | POA: Diagnosis not present

## 2016-09-09 DIAGNOSIS — E119 Type 2 diabetes mellitus without complications: Secondary | ICD-10-CM | POA: Diagnosis not present

## 2016-09-09 DIAGNOSIS — Z79899 Other long term (current) drug therapy: Secondary | ICD-10-CM | POA: Diagnosis not present

## 2016-09-09 DIAGNOSIS — I5022 Chronic systolic (congestive) heart failure: Secondary | ICD-10-CM

## 2016-09-09 DIAGNOSIS — Z882 Allergy status to sulfonamides status: Secondary | ICD-10-CM | POA: Diagnosis not present

## 2016-09-09 DIAGNOSIS — Z87891 Personal history of nicotine dependence: Secondary | ICD-10-CM | POA: Diagnosis not present

## 2016-09-09 DIAGNOSIS — I110 Hypertensive heart disease with heart failure: Secondary | ICD-10-CM | POA: Diagnosis not present

## 2016-09-09 DIAGNOSIS — Z7901 Long term (current) use of anticoagulants: Secondary | ICD-10-CM | POA: Insufficient documentation

## 2016-09-09 DIAGNOSIS — Z880 Allergy status to penicillin: Secondary | ICD-10-CM | POA: Insufficient documentation

## 2016-09-09 DIAGNOSIS — G4733 Obstructive sleep apnea (adult) (pediatric): Secondary | ICD-10-CM | POA: Diagnosis not present

## 2016-09-09 DIAGNOSIS — Z7984 Long term (current) use of oral hypoglycemic drugs: Secondary | ICD-10-CM | POA: Diagnosis not present

## 2016-09-09 DIAGNOSIS — I4821 Permanent atrial fibrillation: Secondary | ICD-10-CM

## 2016-09-09 DIAGNOSIS — I482 Chronic atrial fibrillation: Secondary | ICD-10-CM | POA: Insufficient documentation

## 2016-09-09 MED ORDER — PERFLUTREN LIPID MICROSPHERE
INTRAVENOUS | Status: AC
  Filled 2016-09-09: qty 10

## 2016-09-09 MED ORDER — PERFLUTREN LIPID MICROSPHERE
1.0000 mL | INTRAVENOUS | Status: AC | PRN
  Administered 2016-09-09: 4 mL via INTRAVENOUS
  Filled 2016-09-09: qty 10

## 2016-09-09 MED ORDER — SACUBITRIL-VALSARTAN 97-103 MG PO TABS: 1.0000 | ORAL_TABLET | Freq: Two times a day (BID) | ORAL | 3 refills | Status: DC

## 2016-09-09 NOTE — Progress Notes (Signed)
Echocardiogram 2D Echocardiogram has been performed with definity.  Aggie Cosier 09/09/2016, 12:52 PM

## 2016-09-09 NOTE — Patient Instructions (Addendum)
Echocardiogram done today.  INCREASE Entresto to 97/103mg : Take 1 tab twice daily. Can "double up" on your 49/51 mg tablets (Take 2 twice daily until you run out)  1 month supply of xarelto 20 mg samples given today.  Return for lab work in 1-2 weeks.  Follow up with Dr. Haroldine Laws in 4 months. We will call you closer to this time, or you may call our office to schedule 1 month before you are due to be seen.  Do the following things EVERYDAY: 1) Weigh yourself in the morning before breakfast. Write it down and keep it in a log. 2) Take your medicines as prescribed 3) Eat low salt foods-Limit salt (sodium) to 2000 mg per day.  4) Stay as active as you can everyday 5) Limit all fluids for the day to less than 2 liters

## 2016-09-09 NOTE — Progress Notes (Signed)
Patient ID: LAYKE CATRETT, male   DOB: [birth year], 75 y.o.   MRN: [id number] Patient ID: WENDEL DECANDIA, male   DOB: [birth year], 75 y.o.   MRN: [id number]    Advanced Heart Failure Clinic Note   Referring Physician: Dr Yong Channel  Primary Care: Dr Yong Channel  Primary Cardiologist: EP: Dr Caryl Comes  Neurologists: Dr Leonie Man  HPI: Mr Decicco is a 75 year old with a history of MI 1983, ICM, chronic systolic heart failure s/p Medtronic single chamber ICD, CVA 2014, DM , VT, PE, ,htn, permanent A fib on Xarelto, former smoker 2014  referred to the HF clinic by Drs Caryl Comes and [facility] for worsening EF and increasing exercise intolerance.   Today he returns for HF follow up. Overall doing ok. Feels he is more active around the house. Wishes he had more energy but says he can do all ADLs without problem. Just went to [facility] and walked several miles per day without problem. Mild dyspnea with inclines. Denies PND/Orthopnea. Using CPAP nightly Weighing daily. Stable around 217. Taking all medication. Not watching diet perfectly. Takes lasix only when needed. About 1x/week.   Echo reviewed personally. EF 30-35% with Definity   ICD interrogation (done personally): fluid up until end of May. Now back to baseline. Activity level ~4hr. No VT/AF  06/2015 ECHO EF 20% Severely dilated mild TR,  12/2012 ECHO EF 30-35%   07/2015: k 4.0 Creatinine 0.86  11/2015: K 4.4, creatinine 0.91  RHC.LHC 01/20/2016 RA = 9 RV = 37/3/10 PA = 41/16 (25) PCW = 18 Fick cardiac output/index = 4.5/2.1 Ao = 115/55 (74) LV = 119/8/15 PVR = 1.5 WU SVR = 1141 FA sat = 95% PA sat = 59%, 64% Assessment: 1. Severe 1v CAD with high grade mid LAD lesion (anterior wall felt to be non-viable so treated medically)  2. Ischemic CM with EF 25-30% with akinesis of mid anterior wall and aneurysmal deformity of distal anterior wall and apex 3. Normal filling pressures with moderately reduced cardiac output  CPX 08/13/15 FVC 2.64 (58%)      FEV1 1.66 (46%)     FEV1/FVC 63 (82%)     Resting HR: 65 Peak HR: 113  (73% age predicted max HR) BP rest: 106/60 BP peak: 130/60 Peak VO2: 14.9 (55.2% predicted peak VO2) VE/VCO2 slope: 27.9 OUES: 2.11 Peak RER: 0.95 Ventilatory Threshold: 11.0 (40.8% predicted or measured peak VO2) Peak RR 28 Peak Ventilation: 41.4 VE/MVV: 55.9% PETCO2 at peak: 37 O2pulse: 12  (71% predicted O2pulse)   Past Medical History:  Diagnosis Date  . CAD (coronary artery disease)   . CHF (congestive heart failure) ([place])   . Chronic atrial fibrillation ([place])   . Colon polyps   . Diabetes mellitus   . Diverticulosis of colon   . DIVERTICULOSIS, COLON 10/16/2006   Qualifier: Diagnosis of  By: Leanne Chang MD, [place]    . Excessive daytime sleepiness 02/19/2016  . Hyperlipidemia   . Hypertension   . MI (mitral incompetence)   . Nephrolithiasis   . NEPHROLITHIASIS 06/26/2008   Qualifier: Diagnosis of  By: Sherlynn Stalls, CMA, [place]    . OSA (obstructive sleep apnea) 02/19/2016   Moderate to severe OSA with an AHI of 25/hr and on CPAP at 8cm H2O  . PE (pulmonary embolism)   . V-tach [facility])     Current Outpatient Prescriptions  Medication Sig Dispense Refill  . atorvastatin (LIPITOR) 80 MG tablet TAKE ONE TABLET BY MOUTH ONCE DAILY AS DIRECTED 30 tablet 5  .  carvedilol (COREG) 6.25 MG tablet TAKE ONE TABLET BY MOUTH TWICE DAILY WITH MEALS 180 tablet 3  . digoxin (LANOXIN) 0.25 MG tablet TAKE ONE-HALF TABLET BY MOUTH ONCE DAILY 45 tablet 1  . furosemide (LASIX) 20 MG tablet Take 1 tablet (20 mg total) by mouth as needed (for wt gain of 3 lb in 24 hours). 30 tablet 3  . glimepiride (AMARYL) 2 MG tablet Take 1 tablet (2 mg total) by mouth daily before breakfast. 90 tablet 3  . metFORMIN (GLUCOPHAGE) 500 MG tablet TAKE ONE TABLET BY MOUTH TWICE DAILY WITH MEALS 60 tablet 5  . sacubitril-valsartan (ENTRESTO) 49-51 MG Take 1 tablet by mouth 2 (two) times daily. 60 tablet 6  . XARELTO 20 MG TABS tablet TAKE  ONE TABLET BY MOUTH ONCE DAILY 30 tablet 5   No current facility-administered medications for this encounter.    Facility-Administered Medications Ordered in Other Encounters  Medication Dose Route Frequency Provider Last Rate Last Dose  . perflutren lipid microspheres (DEFINITY) IV suspension  1-10 mL Intravenous PRN Jolaine Artist, MD   4 mL at 09/09/16 1244    Allergies  Allergen Reactions  . Penicillins Other (See Comments)    Patient passed out  . Sulfamethoxazole Rash      Social History   Social History  . Marital status: Married    Spouse name: [name]  . Number of children: 3  . Years of education: 12   Occupational History  . Retired Retired   Social History Main Topics  . Smoking status: Former Smoker    Packs/day: 1.00    Years: 50.00    Types: Cigarettes    Quit date: 12/15/2012  . Smokeless tobacco: Never Used  . Alcohol use No  . Drug use: No  . Sexual activity: Not on file   Other Topics Concern  . Not on file   Social History Narrative   Married. 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 Colon cancer Father   . Diabetes Father   . Heart disease Father   . Bladder Cancer Mother   . Heart disease Mother     Vitals:   09/09/16 1254  BP: 120/66  Pulse: 60  Resp: 18  SpO2: 99%  Weight: 219 lb 8 oz (99.6 kg)    PHYSICAL EXAM: General:  Elderly appearing. NAD HEENT: normal Neck: supple. JVP 7. Carotids 2+ bilat; no bruits. No thyromegaly or nodule noted. Cor: PMI nondisplaced. Irregularly irregular. No M/G/R appreciated. R sided ICD (previous lead fx) Lungs: Coarse lung sounds with decreased BS, otherwise clear. Normal effort Abdomen: soft, NT, ND, no HSM. No bruits or masses. +BS. Back:  Previous surgical scar Extremities: no cyanosis, clubbing, rash, Trace ankle edema  Neuro: alert & oriented x 3, cranial nerves grossly intact. moves all 4 extremities w/o difficulty. Affect pleasant.   ASSESSMENT & PLAN:  1. Chronic Systolic HF- ICM Medtronic ICD. NYHA II-III. ECHO 06/2015 LVEF 20%, Mild MR decreased from 30-35% in 2014. EF 30% on echo today ICD interrogation today: No VT/AF Activity ~3 hours. Volume below threshold - NYHA II. Volume status stable.  Continue prn lasix. - Continue carvedilol 6.25 mg BID and digoxin 0.125 mg. HR too low  to titrate.  - Increase entresto to 97/103 BID. Labs in 1 week.  - No spiro for now.  - 2. OSA - Now using CPAP nightly.  3. Former smoker  - quit 2014 -4. Chronic A fib  - Rate controlled continue current dose of bb  - Continue Xarelto 20 mg daily.  5. Lower extremity fatigue : ABIs ok.  6. CAD- Severe 1V Cad - On statin, bb, xarelto.   Glori Bickers MD  09/09/2016

## 2016-09-09 NOTE — Progress Notes (Signed)
PIV 22g x 1 attempt inserted to RAC per echo tech instruction for definity echo per Dr. Haroldine Laws orders. Patient tolerated well.  Renee Pain, RN

## 2016-09-10 ENCOUNTER — Ambulatory Visit (INDEPENDENT_AMBULATORY_CARE_PROVIDER_SITE_OTHER): Payer: Commercial Managed Care - HMO | Admitting: Family Medicine

## 2016-09-10 ENCOUNTER — Encounter: Payer: Self-pay | Admitting: Family Medicine

## 2016-09-10 VITALS — BP 90/58 | HR 76 | Temp 97.8°F | Wt 218.8 lb

## 2016-09-10 DIAGNOSIS — B078 Other viral warts: Secondary | ICD-10-CM | POA: Diagnosis not present

## 2016-09-10 DIAGNOSIS — Z23 Encounter for immunization: Secondary | ICD-10-CM

## 2016-09-10 NOTE — Progress Notes (Signed)
Patient came in for flu shot. He has a wart on left arm about 38mm in size. There for about a month. Has had similar appearing warts in past- raised several mm.   Cryotherapy completed with two 10 second cycles. We discussed aftercare and return precautions and return in 4 weeks if not completely resolved. I would actually consider shave biopsy in 4 weeks if persists given raised nature and almost horned appearance.

## 2016-09-10 NOTE — Progress Notes (Signed)
Pre visit review using our clinic review tool, if applicable. No additional management support is needed unless otherwise documented below in the visit note. 

## 2016-09-14 DIAGNOSIS — G4733 Obstructive sleep apnea (adult) (pediatric): Secondary | ICD-10-CM | POA: Diagnosis not present

## 2016-09-17 ENCOUNTER — Encounter: Payer: Self-pay | Admitting: Internal Medicine

## 2016-09-20 ENCOUNTER — Ambulatory Visit (HOSPITAL_COMMUNITY)
Admission: RE | Admit: 2016-09-20 | Discharge: 2016-09-20 | Disposition: A | Discharge status: Home / Self Care | Payer: Commercial Managed Care - HMO | Source: Ambulatory Visit | Attending: Internal Medicine | Admitting: Internal Medicine

## 2016-09-20 DIAGNOSIS — I5022 Chronic systolic (congestive) heart failure: Secondary | ICD-10-CM

## 2016-09-20 LAB — BASIC METABOLIC PANEL
ANION GAP: 6 (ref 5–15)
BUN: 17 mg/dL (ref 6–20)
CALCIUM: 9.4 mg/dL (ref 8.9–10.3)
CO2: 29 mmol/L (ref 22–32)
Chloride: 104 mmol/L (ref 101–111)
Creatinine, Ser: 0.97 mg/dL (ref 0.61–1.24)
GFR calc Af Amer: >60 mL/min (ref >60)
Glucose, Bld: 241 mg/dL — ABNORMAL HIGH (ref 65–99)
POTASSIUM: 4.5 mmol/L (ref 3.5–5.1)
SODIUM: 139 mmol/L (ref 135–145)

## 2016-09-27 ENCOUNTER — Other Ambulatory Visit: Payer: Self-pay | Admitting: Internal Medicine

## 2016-09-27 DIAGNOSIS — E119 Type 2 diabetes mellitus without complications: Secondary | ICD-10-CM | POA: Diagnosis not present

## 2016-09-27 LAB — HM DIABETES EYE EXAM

## 2016-09-30 ENCOUNTER — Encounter: Payer: Self-pay | Admitting: Family Medicine

## 2016-10-04 DIAGNOSIS — G4733 Obstructive sleep apnea (adult) (pediatric): Secondary | ICD-10-CM | POA: Diagnosis not present

## 2016-11-18 ENCOUNTER — Ambulatory Visit (INDEPENDENT_AMBULATORY_CARE_PROVIDER_SITE_OTHER): Payer: Commercial Managed Care - HMO | Admitting: Family Medicine

## 2016-11-18 ENCOUNTER — Encounter: Payer: Self-pay | Admitting: Family Medicine

## 2016-11-18 DIAGNOSIS — E785 Hyperlipidemia, unspecified: Secondary | ICD-10-CM | POA: Diagnosis not present

## 2016-11-18 DIAGNOSIS — I482 Chronic atrial fibrillation, unspecified: Secondary | ICD-10-CM

## 2016-11-18 DIAGNOSIS — I2782 Chronic pulmonary embolism: Secondary | ICD-10-CM

## 2016-11-18 DIAGNOSIS — E119 Type 2 diabetes mellitus without complications: Secondary | ICD-10-CM

## 2016-11-18 LAB — COMPREHENSIVE METABOLIC PANEL
ALK PHOS: 79 U/L (ref 39–117)
ALT: 17 U/L (ref 0–53)
AST: 14 U/L (ref 0–37)
Albumin: 4.2 g/dL (ref 3.5–5.2)
BUN: 18 mg/dL (ref 6–23)
CO2: 31 meq/L (ref 19–32)
Calcium: 9.2 mg/dL (ref 8.4–10.5)
Chloride: 102 mEq/L (ref 96–112)
Creatinine, Ser: 0.96 mg/dL (ref 0.40–1.50)
GFR: 81.56 mL/min (ref >60.00)
GLUCOSE: 328 mg/dL — AB (ref 70–99)
POTASSIUM: 4.3 meq/L (ref 3.5–5.1)
SODIUM: 138 meq/L (ref 135–145)
TOTAL PROTEIN: 6.6 g/dL (ref 6.0–8.3)
Total Bilirubin: 0.9 mg/dL (ref 0.2–1.2)

## 2016-11-18 LAB — LDL CHOLESTEROL, DIRECT: Direct LDL: 73 mg/dL

## 2016-11-18 LAB — HEMOGLOBIN A1C: Hgb A1c MFr Bld: 7.8 % — ABNORMAL HIGH (ref 4.6–6.5)

## 2016-11-18 MED ORDER — RIVAROXABAN 20 MG PO TABS: 20.0000 mg | ORAL_TABLET | Freq: Every day | ORAL | 0 refills | Status: DC

## 2016-11-18 NOTE — Assessment & Plan Note (Signed)
S: sounds reasonably controlled.  On metformin 500mg  BID and amaryl 2mg  CBGs- 134-167, declines lows Lab Results  Component Value Date   HGBA1C 7.0 07/19/2016   HGBA1C 7.8 (H) 03/18/2016   HGBA1C 8.4 (H) 11/18/2015   A/P: update a1c today- given right at 3 months discussed possibility not covered. Per front office manager would be $24 cost and he wants to proceed. Target 7.5 or less

## 2016-11-18 NOTE — Progress Notes (Signed)
Subjective:  Carl Wood is a 75 y.o. year old very pleasant male patient who presents for/with See problem oriented charting ROS- some financial stress due to donut hole with xarelto. Denies chest pain. No fever or chills  Past Medical History-  Patient Active Problem List   Diagnosis Date Noted  . Chronic systolic heart failure ([place]) 08/06/2015    Priority: High  . Former smoker 11/15/2014    Priority: High  . Atrial fibrillation ([place]) 12/19/2012    Priority: High  . CVA (cerebral infarction) 12/19/2012    Priority: High  .  ventricular tachycardia-non sustained  02/17/2012    Priority: High  . Cardiomyopathy, ischemic 09/23/2009    Priority: High  . Diabetes mellitus type II, controlled ([place]) 10/16/2006    Priority: High  . CAD (coronary artery disease) 10/16/2006    Priority: High  . PULMONARY EMBOLISM, HX OF 10/16/2006    Priority: High  . OSA (obstructive sleep apnea) 02/19/2016    Priority: Medium  . Carotid artery stenosis 03/17/2015    Priority: Medium  . Automatic implantable cardiac defibrillator  medtronic 02/17/2012    Priority: Medium  . History of skin cancer 07/05/2007    Priority: Medium  . Hyperlipidemia 10/16/2006    Priority: Medium  . Essential hypertension 10/16/2006    Priority: Medium  . LBBB (left bundle branch block) 08/06/2015    Priority: Low  . Actinic keratosis 11/15/2014    Priority: Low  . Trigger finger, acquired 03/14/2014    Priority: Low  . Benign neoplasm of colon 12/11/2012    Priority: Low  . PSEUDOGOUT 08/23/2008    Priority: Low  . Chronic atrial fibrillation ([place])   . Excessive daytime sleepiness 02/19/2016    Medications- reviewed and updated Current Outpatient Prescriptions  Medication Sig Dispense Refill  . atorvastatin (LIPITOR) 80 MG tablet TAKE ONE TABLET BY MOUTH ONCE DAILY AS DIRECTED 30 tablet 5  . carvedilol (COREG) 6.25 MG tablet TAKE ONE TABLET BY MOUTH TWICE DAILY WITH MEALS 180 tablet 3  . digoxin  (LANOXIN) 0.25 MG tablet TAKE ONE-HALF TABLET BY MOUTH ONCE DAILY 45 tablet 2  . furosemide (LASIX) 20 MG tablet Take 1 tablet (20 mg total) by mouth as needed (for wt gain of 3 lb in 24 hours). 30 tablet 3  . glimepiride (AMARYL) 2 MG tablet Take 1 tablet (2 mg total) by mouth daily before breakfast. 90 tablet 3  . metFORMIN (GLUCOPHAGE) 500 MG tablet TAKE ONE TABLET BY MOUTH TWICE DAILY WITH MEALS 60 tablet 5  . sacubitril-valsartan (ENTRESTO) 97-103 MG Take 1 tablet by mouth 2 (two) times daily. 60 tablet 3  . XARELTO 20 MG TABS tablet TAKE ONE TABLET BY MOUTH ONCE DAILY 30 tablet 5   No current facility-administered medications for this visit.     Objective: BP (!) 110/58 (BP Location: Left Arm, Patient Position: Sitting, Cuff Size: Normal)   Pulse 70   Temp 97.9 F (36.6 C) (Oral)   Ht 6' (1.829 m)   Wt 225 lb 3.2 oz (102.2 kg)   SpO2 94%   BMI 30.54 kg/m  Gen: NAD, resting comfortably, appears older than stated age CV: RRR no murmurs rubs or gallops Lungs: CTAB no crackles, wheeze, rhonchi  Ext: no edema Skin: warm, dry  Assessment/Plan:  Diabetes mellitus type II, controlled ([place]) S: sounds reasonably controlled.  On metformin 500mg  BID and amaryl 2mg  CBGs- 134-167, declines lows Lab Results  Component Value Date   HGBA1C 7.0 07/19/2016  HGBA1C 7.8 (H) 03/18/2016   HGBA1C 8.4 (H) 11/18/2015   A/P: update a1c today- given right at 3 months discussed possibility not covered. Per front office manager would be $24 cost and he wants to proceed. Target 7.5 or less  Atrial fibrillation S: a fib. Beta blocker for rate control (coreg) and also on digoxin. On xarelto 20mg  daily A/P: appears in a fib today but appropriately anticoagulated and rate controlled. Gave sample #21 of 20mg  xarelto as in donut hole  Hyperlipidemia S: reasonably controlled on atorvastatin 80mg  in past. No myalgias.  Lab Results  Component Value Date   CHOL 112 03/18/2016   HDL 29.30 (L) 03/18/2016    LDLCALC 69 03/18/2016   LDLDIRECT 71.0 03/17/2015   TRIG 66.0 03/18/2016   CHOLHDL 4 03/18/2016   A/P: given max dose statin will hope for LDL under 70- but even if not quite at goal discussed unlikely to add additional medication  R eye intermittent shooting mild pain 1/10 then goes away. Lasts a few minutes. No changes in vision. Objective: no apparent cause on exam- perrla, no pain with EOMI, sclera normal as are conjunctiva. Advised eye doctor appointment and trial tylenol  Return in about 14 weeks (around 02/24/2017).  Orders Placed This Encounter  Procedures  . CBC    [place]  . Comprehensive metabolic panel    [place]  . Hemoglobin A1c    [place]  . LDL cholesterol, direct    [place]   Return precautions advised.  [name], MD

## 2016-11-18 NOTE — Assessment & Plan Note (Signed)
S: a fib. Beta blocker for rate control (coreg) and also on digoxin. On xarelto 20mg  daily A/P: appears in a fib today but appropriately anticoagulated and rate controlled. Gave sample #21 of 20mg  xarelto as in donut hole

## 2016-11-18 NOTE — Assessment & Plan Note (Signed)
S: reasonably controlled on atorvastatin 80mg  in past. No myalgias.  Lab Results  Component Value Date   CHOL 112 03/18/2016   HDL 29.30 (L) 03/18/2016   LDLCALC 69 03/18/2016   LDLDIRECT 71.0 03/17/2015   TRIG 66.0 03/18/2016   CHOLHDL 4 03/18/2016   A/P: given max dose statin will hope for LDL under 70- but even if not quite at goal discussed unlikely to add additional medication

## 2016-11-18 NOTE — Assessment & Plan Note (Signed)
S: history of DVT and PE. On chronix anticoagulation A/P: continue xarelto chronically for prevention of new PE/DVT

## 2016-11-18 NOTE — Progress Notes (Signed)
Pre visit review using our clinic review tool, if applicable. No additional management support is needed unless otherwise documented below in the visit note. 

## 2016-11-18 NOTE — Patient Instructions (Addendum)
Labs before you leave  I would see if your eye doctor can see you in next few days especially if pain doesn't get better. Can try some tylenol 325-500mg  to see if it helps as well. Could try some heat over eye as well but I cannot tell what is causing the pain. Due to low level pain think ok to watch though  Sit tight so they can bring you the 3 weeks of xarelto

## 2016-11-19 LAB — CBC
HEMATOCRIT: 45.4 % (ref 39.0–52.0)
Hemoglobin: 15.5 g/dL (ref 13.0–17.0)
MCHC: 34.1 g/dL (ref 30.0–36.0)
MCV: 97.2 fl (ref 78.0–100.0)
Platelets: 159 10*3/uL (ref 150.0–400.0)
RBC: 4.67 Mil/uL (ref 4.22–5.81)
RDW: 13.4 % (ref 11.5–15.5)
WBC: 5.5 10*3/uL (ref 4.0–10.5)

## 2016-11-23 ENCOUNTER — Other Ambulatory Visit: Payer: Self-pay

## 2016-11-23 MED ORDER — GLIMEPIRIDE 4 MG PO TABS: 4.0000 mg | ORAL_TABLET | Freq: Every day | ORAL | 3 refills | Status: DC

## 2016-11-24 ENCOUNTER — Telehealth: Payer: Self-pay | Admitting: Family Medicine

## 2016-11-24 NOTE — Telephone Encounter (Signed)
Pt is returning jaime call from yesterday

## 2016-11-25 ENCOUNTER — Other Ambulatory Visit: Payer: Self-pay

## 2016-11-25 MED ORDER — GLIMEPIRIDE 4 MG PO TABS: 4.0000 mg | ORAL_TABLET | Freq: Every day | ORAL | 1 refills | Status: DC

## 2016-11-25 NOTE — Telephone Encounter (Signed)
Spoke with patient who stated he wants his new prescription sent to [facility]

## 2016-12-09 ENCOUNTER — Ambulatory Visit (INDEPENDENT_AMBULATORY_CARE_PROVIDER_SITE_OTHER): Payer: Commercial Managed Care - HMO | Admitting: *Deleted

## 2016-12-09 DIAGNOSIS — I255 Ischemic cardiomyopathy: Secondary | ICD-10-CM

## 2016-12-09 NOTE — Progress Notes (Signed)
Remote ICD transmission.   

## 2016-12-10 ENCOUNTER — Encounter: Payer: Self-pay | Admitting: Cardiology

## 2016-12-13 ENCOUNTER — Other Ambulatory Visit: Payer: Self-pay | Admitting: Family Medicine

## 2016-12-17 LAB — CUP PACEART REMOTE DEVICE CHECK
Date Time Interrogation Session: 20180104083527
HighPow Impedance: 81 Ohm
Implantable Lead Model: 6943
Implantable Pulse Generator Implant Date: 20090817
Lead Channel Sensing Intrinsic Amplitude: 10.157 mV
Lead Channel Setting Pacing Amplitude: 2.5 V
Lead Channel Setting Pacing Pulse Width: 0.8 ms
MDC IDC LEAD IMPLANT DT: 20010723
MDC IDC LEAD LOCATION: 753860
MDC IDC MSMT BATTERY VOLTAGE: 2.62 V
MDC IDC MSMT LEADCHNL RV IMPEDANCE VALUE: 480 Ohm
MDC IDC SET LEADCHNL RV SENSING SENSITIVITY: 0.3 mV
MDC IDC STAT BRADY RV PERCENT PACED: 2.85 %

## 2016-12-20 ENCOUNTER — Other Ambulatory Visit: Payer: Self-pay | Admitting: Family Medicine

## 2016-12-21 ENCOUNTER — Telehealth: Payer: Self-pay

## 2016-12-21 NOTE — Telephone Encounter (Signed)
Prior auth obtained for Digoxin 0.25mg , 1/2 tab daily from [facility]. Local pharmacy notified.

## 2017-01-22 ENCOUNTER — Other Ambulatory Visit (HOSPITAL_COMMUNITY): Payer: Self-pay | Admitting: Internal Medicine

## 2017-02-18 ENCOUNTER — Telehealth: Payer: Self-pay | Admitting: Family Medicine

## 2017-02-24 ENCOUNTER — Ambulatory Visit (INDEPENDENT_AMBULATORY_CARE_PROVIDER_SITE_OTHER): Payer: Medicare HMO | Admitting: Family Medicine

## 2017-02-24 ENCOUNTER — Encounter: Payer: Self-pay | Admitting: Family Medicine

## 2017-02-24 VITALS — BP 100/54 | HR 60 | Temp 97.8°F | Ht 72.0 in | Wt 220.8 lb

## 2017-02-24 DIAGNOSIS — I2782 Chronic pulmonary embolism: Secondary | ICD-10-CM | POA: Diagnosis not present

## 2017-02-24 DIAGNOSIS — E119 Type 2 diabetes mellitus without complications: Secondary | ICD-10-CM | POA: Diagnosis not present

## 2017-02-24 DIAGNOSIS — I482 Chronic atrial fibrillation, unspecified: Secondary | ICD-10-CM

## 2017-02-24 DIAGNOSIS — I5022 Chronic systolic (congestive) heart failure: Secondary | ICD-10-CM

## 2017-02-24 LAB — POCT GLYCOSYLATED HEMOGLOBIN (HGB A1C): Hemoglobin A1C: 7.3

## 2017-02-24 NOTE — Progress Notes (Signed)
Subjective:  Carl Wood is a 76 y.o. year old very pleasant male patient who presents for/with See problem oriented charting ROS- stable shortness of breath. No chest pain. No edema. Stable fatigue.    Past Medical History-  Patient Active Problem List   Diagnosis Date Noted  . Chronic systolic heart failure ([place]) 08/06/2015    Priority: High  . Former smoker 11/15/2014    Priority: High  . Atrial fibrillation ([place]) 12/19/2012    Priority: High  . CVA (cerebral infarction) 12/19/2012    Priority: High  .  ventricular tachycardia-non sustained  02/17/2012    Priority: High  . Cardiomyopathy, ischemic 09/23/2009    Priority: High  . Diabetes mellitus type II, controlled ([place]) 10/16/2006    Priority: High  . CAD (coronary artery disease) 10/16/2006    Priority: High  . Chronic pulmonary embolism ([place]) 10/16/2006    Priority: High  . OSA (obstructive sleep apnea) 02/19/2016    Priority: Medium  . Carotid artery stenosis 03/17/2015    Priority: Medium  . Automatic implantable cardiac defibrillator  medtronic 02/17/2012    Priority: Medium  . History of skin cancer 07/05/2007    Priority: Medium  . Hyperlipidemia 10/16/2006    Priority: Medium  . Essential hypertension 10/16/2006    Priority: Medium  . LBBB (left bundle branch block) 08/06/2015    Priority: Low  . Actinic keratosis 11/15/2014    Priority: Low  . Trigger finger, acquired 03/14/2014    Priority: Low  . Benign neoplasm of colon 12/11/2012    Priority: Low  . PSEUDOGOUT 08/23/2008    Priority: Low  . Chronic atrial fibrillation ([place])   . Excessive daytime sleepiness 02/19/2016    Medications- reviewed and updated Current Outpatient Prescriptions  Medication Sig Dispense Refill  . atorvastatin (LIPITOR) 80 MG tablet TAKE ONE TABLET BY MOUTH ONCE DAILY AS DIRECTED 30 tablet 5  . carvedilol (COREG) 6.25 MG tablet TAKE ONE TABLET BY MOUTH TWICE DAILY WITH MEALS 180 tablet 3  . digoxin (LANOXIN) 0.25  MG tablet TAKE ONE-HALF TABLET BY MOUTH ONCE DAILY 45 tablet 2  . ENTRESTO 97-103 MG TAKE ONE TABLET BY MOUTH TWICE DAILY 60 tablet 11  . furosemide (LASIX) 20 MG tablet Take 1 tablet (20 mg total) by mouth as needed (for wt gain of 3 lb in 24 hours). 30 tablet 3  . glimepiride (AMARYL) 4 MG tablet Take 1 tablet (4 mg total) by mouth daily before breakfast. 90 tablet 1  . metFORMIN (GLUCOPHAGE) 500 MG tablet TAKE ONE TABLET BY MOUTH TWICE DAILY WITH MEALS 60 tablet 5  . rivaroxaban (XARELTO) 20 MG TABS tablet Take 1 tablet (20 mg total) by mouth daily with supper. 21 tablet 0   No current facility-administered medications for this visit.     Objective: BP (!) 100/54 (BP Location: Left Arm, Patient Position: Sitting, Cuff Size: Large)   Pulse 60   Temp 97.8 F (36.6 C) (Oral)   Ht 6' (1.829 m)   Wt 220 lb 12.8 oz (100.2 kg)   SpO2 94%   BMI 29.95 kg/m  Gen: NAD, resting comfortably CV: irregularly irregular no murmurs rubs or gallops Lungs: CTAB no crackles, wheeze, rhonchi Ext: no edema Skin: warm, dry  Assessment/Plan:  Diabetes mellitus type II, controlled ([place]) S: improved controlled. On metformin 500mg  BID and amaryl 2mg , now 4mg  CBGs- fasting 120-180, no lows Lab Results  Component Value Date   HGBA1C 7.3 02/24/2017   HGBA1C 7.8 (H) 11/18/2016  HGBA1C 7.0 07/19/2016   A/P: think a1c target under 7.5 with other comorbidities is reasonable- will continue current dose. 4 month follow up. Weight also down 5 lbs- he is trying to eat a little better and appears euvolemic.   Atrial fibrillation S: compliant with coreg and digoxin for rate control. On xarelto 20mg  for anticoagulation A/P: rate controlled and anticoagulated appropriately. Gave some xarelto samples- he is hoping this may help keep him out of donut hole this year.    Chronic systolic heart failure (HCC) S: weight down a few lbs. No edema. Stable SOB- issues with hills and stairs but does ok on flat surfaces.  Compliant with entresto and only uses lasix prn A/P:   Appears euvolemic- continue current medications   Chronic pulmonary embolism ([place]) Will continue on xarelto 20mg  long term with a fib and chronic PE  Some pain at bototm of upper right arm with reaching behind him. He states this is improving. Exam only with mild pain with resisted flexion of bicep with arm extended. Conservative care advised with heat  4 months  Orders Placed This Encounter  Procedures  . POCT glycosylated hemoglobin (Hb A1C)   Return precautions advised.  [name], MD

## 2017-02-24 NOTE — Assessment & Plan Note (Signed)
S: weight down a few lbs. No edema. Stable SOB- issues with hills and stairs but does ok on flat surfaces. Compliant with entresto and only uses lasix prn A/P:   Appears euvolemic- continue current medications

## 2017-02-24 NOTE — Progress Notes (Signed)
Pre visit review using our clinic review tool, if applicable. No additional management support is needed unless otherwise documented below in the visit note.  Verbal order received for patient to receive sample medication. Patient teaching completed by provider.   Medication (including dosage):Xarelto 20mg   Lot #:[id number] Expiration: 08-19  Qty: 14 tablets

## 2017-02-24 NOTE — Assessment & Plan Note (Signed)
S: compliant with coreg and digoxin for rate control. On xarelto 20mg  for anticoagulation A/P: rate controlled and anticoagulated appropriately. Gave some xarelto samples- he is hoping this may help keep him out of donut hole this year.

## 2017-02-24 NOTE — Patient Instructions (Signed)
Lab Results  Component Value Date   HGBA1C 7.3 02/24/2017  A1c has improved from 7.8 to 7.3. I am ok with just watching for now  Can try some heat 20 minutes 3x a day if needed for the arm- glad its improving

## 2017-02-24 NOTE — Assessment & Plan Note (Signed)
Will continue on xarelto 20mg  long term with a fib and chronic PE

## 2017-02-24 NOTE — Assessment & Plan Note (Signed)
S: improved controlled. On metformin 500mg  BID and amaryl 2mg , now 4mg  CBGs- fasting 120-180, no lows Lab Results  Component Value Date   HGBA1C 7.3 02/24/2017   HGBA1C 7.8 (H) 11/18/2016   HGBA1C 7.0 07/19/2016   A/P: think a1c target under 7.5 with other comorbidities is reasonable- will continue current dose. 4 month follow up. Weight also down 5 lbs- he is trying to eat a little better and appears euvolemic.

## 2017-02-28 ENCOUNTER — Telehealth: Payer: Self-pay | Admitting: Family Medicine

## 2017-03-01 NOTE — Telephone Encounter (Signed)
error 

## 2017-03-09 ENCOUNTER — Telehealth (HOSPITAL_COMMUNITY): Payer: Self-pay | Admitting: *Deleted

## 2017-03-09 NOTE — Telephone Encounter (Signed)
Pt came by requesting samples of Xarelto and Entresto, samples provided.  Medication Samples have been provided to the patient.  Drug name: Xarelto       Strength: 20 mg        Qty: 4  LOT: [id number]  Exp.Date: 4/20  Dosing instructions: 1 tab daily  The patient has been instructed regarding the correct time, dose, and frequency of taking this medication, including desired effects and most common side effects.   Marielys Trinidad 11:28 AM 03/09/2017   Medication Samples have been provided to the patient.  Drug name: Delene Loll       Strength: 49/51        Qty: 6  LOT: [id number]  Exp.Date: 10/19  Dosing instructions: 2 tabs Twice daily   The patient has been instructed regarding the correct time, dose, and frequency of taking this medication, including desired effects and most common side effects.   Tsering Leaman 11:31 AM 03/09/2017

## 2017-03-10 ENCOUNTER — Encounter: Payer: Self-pay | Admitting: *Deleted

## 2017-03-10 ENCOUNTER — Ambulatory Visit (INDEPENDENT_AMBULATORY_CARE_PROVIDER_SITE_OTHER): Payer: Medicare HMO | Admitting: Nurse Practitioner

## 2017-03-10 ENCOUNTER — Ambulatory Visit (INDEPENDENT_AMBULATORY_CARE_PROVIDER_SITE_OTHER): Payer: Medicare HMO | Admitting: *Deleted

## 2017-03-10 ENCOUNTER — Encounter: Payer: Self-pay | Admitting: Nurse Practitioner

## 2017-03-10 VITALS — BP 118/60 | HR 76 | Ht 72.0 in | Wt 225.1 lb

## 2017-03-10 DIAGNOSIS — I2510 Atherosclerotic heart disease of native coronary artery without angina pectoris: Secondary | ICD-10-CM

## 2017-03-10 DIAGNOSIS — Z95810 Presence of automatic (implantable) cardiac defibrillator: Secondary | ICD-10-CM

## 2017-03-10 DIAGNOSIS — I5022 Chronic systolic (congestive) heart failure: Secondary | ICD-10-CM

## 2017-03-10 DIAGNOSIS — I482 Chronic atrial fibrillation: Secondary | ICD-10-CM | POA: Diagnosis not present

## 2017-03-10 DIAGNOSIS — I4821 Permanent atrial fibrillation: Secondary | ICD-10-CM

## 2017-03-10 LAB — CUP PACEART REMOTE DEVICE CHECK
HighPow Impedance: 78 Ohm
Implantable Lead Implant Date: 20010723
Implantable Pulse Generator Implant Date: 20090817
Lead Channel Impedance Value: 480 Ohm
Lead Channel Sensing Intrinsic Amplitude: 9.141 mV
Lead Channel Setting Pacing Amplitude: 2.5 V
Lead Channel Setting Sensing Sensitivity: 0.3 mV
MDC IDC LEAD LOCATION: 753860
MDC IDC MSMT BATTERY VOLTAGE: 2.61 V
MDC IDC SESS DTM: 20180405041706
MDC IDC SET LEADCHNL RV PACING PULSEWIDTH: 0.8 ms
MDC IDC STAT BRADY RV PERCENT PACED: 2.41 %

## 2017-03-10 LAB — CUP PACEART INCLINIC DEVICE CHECK
Implantable Lead Implant Date: 20010723
Implantable Lead Location: 753860
Implantable Lead Model: 6943
Implantable Pulse Generator Implant Date: 20090817
MDC IDC SESS DTM: 20180405132049

## 2017-03-10 NOTE — Progress Notes (Signed)
Electrophysiology Office Note Date: 03/10/2017  ID:  Carl Wood, DOB 1941/08/03, MRN [id number]  PCP: [name], MD Primary Cardiologist: Bensimhon Electrophysiologist: Caryl Comes  CC: ICD at [facility] is a 76 y.o. male seen today for Dr Caryl Comes.  He presents today for routine electrophysiology followup. His ICD has reached ERI and he comes in today to discuss gen change. He is now followed by the AHF clinic.  He denies chest pain, palpitations, worsening dyspnea, PND, orthopnea, nausea, vomiting, dizziness, syncope, edema, weight gain, or early satiety.  He has not had ICD shocks.   Device History: MDT single chamber ICD implanted 1997 for ICM; gen change 2001; gen change 2009 History of appropriate therapy: yes History of AAD therapy: No   Past Medical History:  Diagnosis Date  . CAD (coronary artery disease)   . CHF (congestive heart failure) ([place])   . Chronic atrial fibrillation ([place])   . Colon polyps   . Diabetes mellitus   . Diverticulosis of colon   . DIVERTICULOSIS, COLON 10/16/2006   Qualifier: Diagnosis of  By: Leanne Chang MD, [place]    . Excessive daytime sleepiness 02/19/2016  . Hyperlipidemia   . Hypertension   . MI (mitral incompetence)   . Nephrolithiasis   . NEPHROLITHIASIS 06/26/2008   Qualifier: Diagnosis of  By: Sherlynn Stalls, CMA, [place]    . OSA (obstructive sleep apnea) 02/19/2016   Moderate to severe OSA with an AHI of 25/hr and on CPAP at 8cm H2O  . PE (pulmonary embolism)   . V-tach [facility])    Past Surgical History:  Procedure Laterality Date  . BACK SURGERY    . BASAL CELL CARCINOMA EXCISION     nose  . CARDIAC CATHETERIZATION N/A 01/20/2016   Procedure: Right/Left Heart Cath and Coronary Angiography;  Surgeon: Jolaine Artist, MD;  Location: [facility];  Service: Cardiovascular;  Laterality: N/A;  . CARDIAC DEFIBRILLATOR PLACEMENT     medtronic virtuoso  . CHOLECYSTECTOMY    . COLONOSCOPY  12/11/2012   Procedure: COLONOSCOPY;   Surgeon: Inda Castle, MD;  Location: [facility];  Service: Endoscopy;  Laterality: N/A;  . KNEE ARTHROPLASTY      Current Outpatient Prescriptions  Medication Sig Dispense Refill  . atorvastatin (LIPITOR) 80 MG tablet TAKE ONE TABLET BY MOUTH ONCE DAILY AS DIRECTED 30 tablet 5  . carvedilol (COREG) 6.25 MG tablet TAKE ONE TABLET BY MOUTH TWICE DAILY WITH MEALS 180 tablet 3  . digoxin (LANOXIN) 0.25 MG tablet TAKE ONE-HALF TABLET BY MOUTH ONCE DAILY 45 tablet 2  . ENTRESTO 97-103 MG TAKE ONE TABLET BY MOUTH TWICE DAILY 60 tablet 11  . furosemide (LASIX) 20 MG tablet Take 1 tablet (20 mg total) by mouth as needed (for wt gain of 3 lb in 24 hours). 30 tablet 3  . glimepiride (AMARYL) 4 MG tablet Take 1 tablet (4 mg total) by mouth daily before breakfast. 90 tablet 1  . metFORMIN (GLUCOPHAGE) 500 MG tablet TAKE ONE TABLET BY MOUTH TWICE DAILY WITH MEALS 60 tablet 5  . rivaroxaban (XARELTO) 20 MG TABS tablet Take 1 tablet (20 mg total) by mouth daily with supper. 21 tablet 0   No current facility-administered medications for this visit.     Allergies:   Penicillins and Sulfamethoxazole   Social History: Social History   Social History  . Marital status: Married    Spouse name: [name]  . Number of children: 3  . Years of education: 64  Occupational History  . Retired Retired   Social History Main Topics  . Smoking status: Former Smoker    Packs/day: 1.00    Years: 50.00    Types: Cigarettes    Quit date: 12/15/2012  . Smokeless tobacco: Never Used  . Alcohol use No  . Drug use: No  . Sexual activity: Not on file   Other Topics Concern  . Not on file   Social History Narrative   Married. 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 Colon cancer Father   . Diabetes Father   . Heart disease Father   . Bladder Cancer Mother   . Heart disease Mother     Review of Systems: All other systems reviewed and are otherwise negative except as noted above.   Physical Exam: VS:  BP 118/60   Pulse 76   Ht 6' (1.829 m)   Wt 225 lb 2 oz (102.1 kg)   SpO2 95%   BMI 30.53 kg/m  , BMI Body mass index is 30.53 kg/m.  GEN- The patient is well appearing, alert and oriented x 3 today.   HEENT: normocephalic, atraumatic; sclera clear, conjunctiva pink; hearing intact; oropharynx clear; neck supple  Lungs- Clear to ausculation bilaterally, normal work of breathing.  No wheezes, rales, rhonchi Heart- Regular rate and rhythm  GI- soft, non-tender, non-distended, bowel sounds present  Extremities- no clubbing, cyanosis, or edema  MS- no significant deformity or atrophy Skin- warm and dry, no rash or lesion; ICD pocket well healed Psych- euthymic mood, full affect Neuro- strength and sensation are intact  ICD interrogation- reviewed in detail today,  See PACEART report  EKG:  EKG is ordered today. The ekg ordered today shows atrial fibrillation, V rate 76, IVCD QRS 139msec  Recent Labs: 05/21/2016: Brain Natriuretic Peptide 97.2 11/18/2016: ALT 17; BUN 18; Creatinine, Ser 0.96; Hemoglobin 15.5; Platelets 159.0; Potassium 4.3; Sodium 138   Wt Readings from Last 3 Encounters:  03/10/17 225 lb 2 oz (102.1 kg)  02/24/17 220 lb 12.8 oz (100.2 kg)  11/18/16 225 lb 3.2 oz (102.2 kg)     Other studies Reviewed: Additional studies/ records that were reviewed today include: Dr Caryl Comes and Dr Bensimhon's office notes  Assessment and Plan:  1.  Chronic systolic dysfunction euvolemic today Stable on an appropriate medical regimen ICD at [facility] See Pace Art report No changes today The patient has an IVCD that is not typical left bundle, but with a QRS of >163msec. Dr Olin Pia last note states that he was going  to talk with Dr Haroldine Laws about possible device upgrade. Risks, benefits to ICD generator change were discussed with the patient and his wife today who wish to proceed. I have scheduled as a CRT upgrade with final determination of device at time of procedure If it is not felt that he would benefit from CRT upgrade, could also consider Beat-HF study. I will ask research to go ahead and screen  2.  Permanent atrial fibrillation V rates controlled Continue Xarelto for CHADS2VASC of 7 He was off Coumadin in the past for a prolonged period of time and developed PE. I have only asked him to hold Xarelto night before ICD gen change  3.  CAD No recent ischemic symptoms Continue current therapy    Current medicines are reviewed at length with the patient today.   The patient does not have concerns regarding his medicines.  The following changes were made today:  none  Labs/ tests ordered today include: pre-procedure labs  Orders Placed This Encounter  Procedures  . CUP PACEART [place]  . EKG 12-Lead     Disposition:   Follow up with Dr Caryl Comes following gen change/upgrade    Signed, Chanetta Marshall, NP 03/10/2017 1:21 PM  [facility] [address] [place] [address] [phone] (office) [phone] (fax)

## 2017-03-10 NOTE — Progress Notes (Signed)
Remote ICD transmission.   

## 2017-03-10 NOTE — Patient Instructions (Addendum)
Medication Instructions:   Your physician recommends that you continue on your current medications as directed. Please refer to the Current Medication list given to you today.   If you need a refill on your cardiac medications before your next appointment, please call your pharmacy.  Labwork:  NONE ORDERED  TODAY    Testing/Procedures:  SEE LETTER FOR PROCEDURE     Follow-Up:   AFTER 03-28-2017   10 DAY POST WOUND FOLLOW UP   90 DAY PHYS DEFIB WITH DR Caryl Comes    Any Other Special Instructions Will Be Listed Below (If Applicable).                                                                                                                                                 '

## 2017-03-14 ENCOUNTER — Other Ambulatory Visit: Payer: Self-pay | Admitting: Family Medicine

## 2017-03-16 DIAGNOSIS — L820 Inflamed seborrheic keratosis: Secondary | ICD-10-CM | POA: Diagnosis not present

## 2017-03-16 DIAGNOSIS — L570 Actinic keratosis: Secondary | ICD-10-CM | POA: Diagnosis not present

## 2017-03-23 ENCOUNTER — Telehealth: Payer: Self-pay

## 2017-03-23 ENCOUNTER — Other Ambulatory Visit: Payer: Medicare HMO

## 2017-03-23 ENCOUNTER — Other Ambulatory Visit: Payer: Self-pay | Admitting: Nurse Practitioner

## 2017-03-23 DIAGNOSIS — I10 Essential (primary) hypertension: Secondary | ICD-10-CM

## 2017-03-23 LAB — CBC
Hematocrit: 44.5 % (ref 37.5–51.0)
Hemoglobin: 14.6 g/dL (ref 13.0–17.7)
MCH: 32.4 pg (ref 26.6–33.0)
MCHC: 32.8 g/dL (ref 31.5–35.7)
MCV: 99 fL — ABNORMAL HIGH (ref 79–97)
PLATELETS: 159 10*3/uL (ref 150–379)
RBC: 4.51 x10E6/uL (ref 4.14–5.80)
RDW: 13.8 % (ref 12.3–15.4)
WBC: 4.6 10*3/uL (ref 3.4–10.8)

## 2017-03-23 LAB — BASIC METABOLIC PANEL
BUN / CREAT RATIO: 18 (ref 10–24)
BUN: 15 mg/dL (ref 8–27)
CO2: 26 mmol/L (ref 18–29)
CREATININE: 0.84 mg/dL (ref 0.76–1.27)
Calcium: 9.2 mg/dL (ref 8.6–10.2)
Chloride: 98 mmol/L (ref 96–106)
GFR calc Af Amer: 100 mL/min/{1.73_m2} (ref >59)
GFR calc non Af Amer: 87 mL/min/{1.73_m2} (ref >59)
Glucose: 298 mg/dL — ABNORMAL HIGH (ref 65–99)
Potassium: 4.5 mmol/L (ref 3.5–5.2)
SODIUM: 139 mmol/L (ref 134–144)

## 2017-03-23 NOTE — Telephone Encounter (Signed)
Procedure labs placed

## 2017-03-28 ENCOUNTER — Ambulatory Visit (HOSPITAL_COMMUNITY)
Admission: RE | Admit: 2017-03-28 | Discharge: 2017-03-29 | Disposition: A | Discharge status: Home / Self Care | Payer: Medicare HMO | Source: Ambulatory Visit | Attending: Internal Medicine | Admitting: Internal Medicine

## 2017-03-28 ENCOUNTER — Encounter (HOSPITAL_COMMUNITY)
Admission: RE | Disposition: A | Discharge status: Home / Self Care | Payer: Self-pay | Source: Ambulatory Visit | Attending: Internal Medicine

## 2017-03-28 ENCOUNTER — Encounter (HOSPITAL_COMMUNITY): Payer: Self-pay

## 2017-03-28 DIAGNOSIS — I252 Old myocardial infarction: Secondary | ICD-10-CM | POA: Diagnosis not present

## 2017-03-28 DIAGNOSIS — Z87891 Personal history of nicotine dependence: Secondary | ICD-10-CM | POA: Insufficient documentation

## 2017-03-28 DIAGNOSIS — Z880 Allergy status to penicillin: Secondary | ICD-10-CM | POA: Diagnosis not present

## 2017-03-28 DIAGNOSIS — I5022 Chronic systolic (congestive) heart failure: Secondary | ICD-10-CM | POA: Diagnosis present

## 2017-03-28 DIAGNOSIS — I447 Left bundle-branch block, unspecified: Secondary | ICD-10-CM | POA: Diagnosis not present

## 2017-03-28 DIAGNOSIS — E785 Hyperlipidemia, unspecified: Secondary | ICD-10-CM | POA: Insufficient documentation

## 2017-03-28 DIAGNOSIS — G4733 Obstructive sleep apnea (adult) (pediatric): Secondary | ICD-10-CM | POA: Insufficient documentation

## 2017-03-28 DIAGNOSIS — Z882 Allergy status to sulfonamides status: Secondary | ICD-10-CM | POA: Insufficient documentation

## 2017-03-28 DIAGNOSIS — Z7901 Long term (current) use of anticoagulants: Secondary | ICD-10-CM | POA: Diagnosis not present

## 2017-03-28 DIAGNOSIS — I2510 Atherosclerotic heart disease of native coronary artery without angina pectoris: Secondary | ICD-10-CM | POA: Diagnosis not present

## 2017-03-28 DIAGNOSIS — Z95810 Presence of automatic (implantable) cardiac defibrillator: Secondary | ICD-10-CM | POA: Diagnosis present

## 2017-03-28 DIAGNOSIS — I482 Chronic atrial fibrillation, unspecified: Secondary | ICD-10-CM | POA: Diagnosis present

## 2017-03-28 DIAGNOSIS — I472 Ventricular tachycardia, unspecified: Secondary | ICD-10-CM

## 2017-03-28 DIAGNOSIS — Z8249 Family history of ischemic heart disease and other diseases of the circulatory system: Secondary | ICD-10-CM | POA: Diagnosis not present

## 2017-03-28 DIAGNOSIS — I255 Ischemic cardiomyopathy: Secondary | ICD-10-CM | POA: Diagnosis not present

## 2017-03-28 DIAGNOSIS — Z86711 Personal history of pulmonary embolism: Secondary | ICD-10-CM | POA: Diagnosis not present

## 2017-03-28 DIAGNOSIS — E119 Type 2 diabetes mellitus without complications: Secondary | ICD-10-CM | POA: Diagnosis not present

## 2017-03-28 DIAGNOSIS — I714 Abdominal aortic aneurysm, without rupture, unspecified: Secondary | ICD-10-CM | POA: Diagnosis present

## 2017-03-28 DIAGNOSIS — Z7984 Long term (current) use of oral hypoglycemic drugs: Secondary | ICD-10-CM | POA: Diagnosis not present

## 2017-03-28 DIAGNOSIS — I4729 Other ventricular tachycardia: Secondary | ICD-10-CM

## 2017-03-28 DIAGNOSIS — Z959 Presence of cardiac and vascular implant and graft, unspecified: Secondary | ICD-10-CM

## 2017-03-28 DIAGNOSIS — I110 Hypertensive heart disease with heart failure: Secondary | ICD-10-CM | POA: Diagnosis not present

## 2017-03-28 HISTORY — PX: BIV UPGRADE: EP1202

## 2017-03-28 LAB — SURGICAL PCR SCREEN
MRSA, PCR: NEGATIVE
Staphylococcus aureus: NEGATIVE

## 2017-03-28 LAB — GLUCOSE, CAPILLARY
GLUCOSE-CAPILLARY: 158 mg/dL — AB (ref 65–99)
GLUCOSE-CAPILLARY: 215 mg/dL — AB (ref 65–99)
GLUCOSE-CAPILLARY: 169 mg/dL — AB (ref 65–99)
Glucose-Capillary: 175 mg/dL — ABNORMAL HIGH (ref 65–99)

## 2017-03-28 SURGERY — BIV UPGRADE

## 2017-03-28 MED ORDER — FENTANYL CITRATE (PF) 100 MCG/2ML IJ SOLN
INTRAMUSCULAR | Status: DC | PRN
  Administered 2017-03-28 (×5): 25 ug via INTRAVENOUS

## 2017-03-28 MED ORDER — HEPARIN (PORCINE) IN NACL 2-0.9 UNIT/ML-% IJ SOLN
INTRAMUSCULAR | Status: DC | PRN
  Administered 2017-03-28: 1000 mL

## 2017-03-28 MED ORDER — LIDOCAINE HCL (PF) 1 % IJ SOLN
INTRAMUSCULAR | Status: AC
  Filled 2017-03-28: qty 60

## 2017-03-28 MED ORDER — VANCOMYCIN HCL IN DEXTROSE 1-5 GM/200ML-% IV SOLN
1000.0000 mg | INTRAVENOUS | Status: AC
  Administered 2017-03-28: 1000 mg via INTRAVENOUS
  Filled 2017-03-28: qty 200

## 2017-03-28 MED ORDER — SODIUM CHLORIDE 0.9 % IR SOLN
Status: AC
  Filled 2017-03-28: qty 2

## 2017-03-28 MED ORDER — SODIUM CHLORIDE 0.9 % IV SOLN
INTRAVENOUS | Status: DC
  Administered 2017-03-28: 07:00:00 via INTRAVENOUS

## 2017-03-28 MED ORDER — ORAL CARE MOUTH RINSE
15.0000 mL | Freq: Two times a day (BID) | OROMUCOSAL | Status: DC
  Administered 2017-03-28 – 2017-03-29 (×2): 15 mL via OROMUCOSAL

## 2017-03-28 MED ORDER — METFORMIN HCL 500 MG PO TABS
500.0000 mg | ORAL_TABLET | Freq: Two times a day (BID) | ORAL | Status: DC
  Administered 2017-03-29: 500 mg via ORAL
  Filled 2017-03-28: qty 1

## 2017-03-28 MED ORDER — ATORVASTATIN CALCIUM 80 MG PO TABS
80.0000 mg | ORAL_TABLET | Freq: Every day | ORAL | Status: DC
  Administered 2017-03-28: 80 mg via ORAL
  Filled 2017-03-28: qty 1

## 2017-03-28 MED ORDER — MIDAZOLAM HCL 5 MG/5ML IJ SOLN
INTRAMUSCULAR | Status: AC
  Filled 2017-03-28: qty 5

## 2017-03-28 MED ORDER — IOPAMIDOL (ISOVUE-370) INJECTION 76%
INTRAVENOUS | Status: DC | PRN
  Administered 2017-03-28: 16 mL
  Administered 2017-03-28: 15 mL via INTRAVENOUS

## 2017-03-28 MED ORDER — SACUBITRIL-VALSARTAN 97-103 MG PO TABS
1.0000 | ORAL_TABLET | Freq: Two times a day (BID) | ORAL | Status: DC
  Administered 2017-03-28 – 2017-03-29 (×2): 1 via ORAL
  Filled 2017-03-28 (×3): qty 1

## 2017-03-28 MED ORDER — VANCOMYCIN HCL IN DEXTROSE 1-5 GM/200ML-% IV SOLN
INTRAVENOUS | Status: AC
  Filled 2017-03-28: qty 200

## 2017-03-28 MED ORDER — CARVEDILOL 6.25 MG PO TABS
6.2500 mg | ORAL_TABLET | Freq: Two times a day (BID) | ORAL | Status: DC
  Administered 2017-03-28 – 2017-03-29 (×2): 6.25 mg via ORAL
  Filled 2017-03-28 (×2): qty 1

## 2017-03-28 MED ORDER — FENTANYL CITRATE (PF) 100 MCG/2ML IJ SOLN
INTRAMUSCULAR | Status: AC
  Filled 2017-03-28: qty 2

## 2017-03-28 MED ORDER — HEPARIN (PORCINE) IN NACL 2-0.9 UNIT/ML-% IJ SOLN
INTRAMUSCULAR | Status: AC
  Filled 2017-03-28: qty 500

## 2017-03-28 MED ORDER — RIVAROXABAN 20 MG PO TABS
20.0000 mg | ORAL_TABLET | Freq: Every day | ORAL | Status: DC
  Administered 2017-03-28: 20 mg via ORAL
  Filled 2017-03-28: qty 1

## 2017-03-28 MED ORDER — MIDAZOLAM HCL 5 MG/5ML IJ SOLN
INTRAMUSCULAR | Status: DC | PRN
  Administered 2017-03-28: 2 mg via INTRAVENOUS
  Administered 2017-03-28 (×3): 1 mg via INTRAVENOUS

## 2017-03-28 MED ORDER — IOPAMIDOL (ISOVUE-370) INJECTION 76%
INTRAVENOUS | Status: AC
  Filled 2017-03-28: qty 50

## 2017-03-28 MED ORDER — VANCOMYCIN HCL IN DEXTROSE 1-5 GM/200ML-% IV SOLN
1000.0000 mg | Freq: Two times a day (BID) | INTRAVENOUS | Status: AC
  Administered 2017-03-28: 1000 mg via INTRAVENOUS
  Filled 2017-03-28: qty 200

## 2017-03-28 MED ORDER — GENTAMICIN SULFATE 40 MG/ML IJ SOLN
80.0000 mg | INTRAMUSCULAR | Status: AC
  Administered 2017-03-28: 80 mg
  Filled 2017-03-28: qty 2

## 2017-03-28 MED ORDER — DIGOXIN 125 MCG PO TABS
125.0000 ug | ORAL_TABLET | Freq: Every day | ORAL | Status: DC
  Administered 2017-03-29: 125 ug via ORAL
  Filled 2017-03-28: qty 1

## 2017-03-28 MED ORDER — ONDANSETRON HCL 4 MG/2ML IJ SOLN
4.0000 mg | Freq: Four times a day (QID) | INTRAMUSCULAR | Status: DC | PRN
Start: 2017-03-28 — End: 2017-03-29

## 2017-03-28 MED ORDER — LIDOCAINE HCL (PF) 1 % IJ SOLN
INTRAMUSCULAR | Status: DC | PRN
  Administered 2017-03-28: 46 mL

## 2017-03-28 MED ORDER — ACETAMINOPHEN 325 MG PO TABS
325.0000 mg | ORAL_TABLET | ORAL | Status: DC | PRN
  Administered 2017-03-28: 650 mg via ORAL
  Administered 2017-03-28: 325 mg via ORAL
  Filled 2017-03-28 (×2): qty 2

## 2017-03-28 MED ORDER — GLIMEPIRIDE 4 MG PO TABS
4.0000 mg | ORAL_TABLET | Freq: Every day | ORAL | Status: DC
  Administered 2017-03-29: 4 mg via ORAL
  Filled 2017-03-28: qty 1

## 2017-03-28 MED ORDER — CHLORHEXIDINE GLUCONATE 4 % EX LIQD
60.0000 mL | Freq: Once | CUTANEOUS | Status: DC
  Filled 2017-03-28: qty 60

## 2017-03-28 MED ORDER — FUROSEMIDE 20 MG PO TABS: 20.0000 mg | ORAL_TABLET | ORAL | Status: DC | PRN

## 2017-03-28 MED ORDER — MUPIROCIN 2 % EX OINT
1.0000 "application " | TOPICAL_OINTMENT | Freq: Once | CUTANEOUS | Status: AC
  Administered 2017-03-28: 1 via TOPICAL
  Filled 2017-03-28: qty 22

## 2017-03-28 MED ORDER — MUPIROCIN 2 % EX OINT
TOPICAL_OINTMENT | CUTANEOUS | Status: AC
  Administered 2017-03-28: 1 via TOPICAL
  Filled 2017-03-28: qty 22

## 2017-03-28 SURGICAL SUPPLY — 18 items
BALLN ATTAIN 80 (BALLOONS) ×2
BALLOON ATTAIN 80 (BALLOONS) ×1 IMPLANT
CABLE SURGICAL S-101-97-12 (CABLE) ×2 IMPLANT
CATH ATTAIN SEL SURV 6248V-90 (CATHETERS) ×2 IMPLANT
CATH ATTAIN SELECT 6238TEL (CATHETERS) ×2 IMPLANT
CATH CPS DIRECT RT DS2C023 (CATHETERS) ×2 IMPLANT
HEMOSTAT SURGICEL 2X4 FIBR (HEMOSTASIS) ×2 IMPLANT
ICD CLARIA MRI DTMA1Q1 (ICD Generator) ×2 IMPLANT
KIT ESSENTIALS PG (KITS) ×2 IMPLANT
LEAD QUARTET 1458Q-86CM (Lead) ×2 IMPLANT
PAD DEFIB LIFELINK (PAD) ×2 IMPLANT
SHEATH CLASSIC 9.5F (SHEATH) ×2 IMPLANT
SHIELD RADPAD SCOOP 12X17 (MISCELLANEOUS) ×2 IMPLANT
SLITTER 6232ADJ (MISCELLANEOUS) ×2 IMPLANT
SLITTER UNIVERSAL DS2A003 (MISCELLANEOUS) ×2 IMPLANT
TRAY PACEMAKER INSERTION (PACKS) ×2 IMPLANT
WIRE ACUITY WHISPER EDS 4648 (WIRE) ×2 IMPLANT
WIRE HI TORQ VERSACORE-J 145CM (WIRE) ×2 IMPLANT

## 2017-03-28 NOTE — Op Note (Signed)
NAME:  ZYAIR, RUSSI NO.:  MEDICAL RECORD NO.:  [id number]  LOCATION:                                 FACILITY:  PHYSICIAN:  Deboraha Sprang, MD, [facility]   DATE OF BIRTH:  DATE OF PROCEDURE:  03/28/2017 DATE OF DISCHARGE:                              OPERATIVE REPORT   PREOPERATIVE DIAGNOSIS:  Ischemic cardiomyopathy, congestive heart failure, previously implanted implantable cardioverter defibrillator and a left bundle-branch block like intraventricular conduction delay.  POSTOPERATIVE DIAGNOSIS:  Ischemic cardiomyopathy, congestive heart failure, previously implanted implantable cardioverter defibrillator and a left bundle-branch block like intraventricular conduction delay.  PROCEDURE:  Device generator replacement with left ventricular lead placement and high-voltage lead assessment and contrast venogram.  DESCRIPTION OF PROCEDURE:  Following obtaining informed consent, the patient was brought to electrophysiology laboratory and placed on the fluoroscopic table in supine position.  After routine prep and drape of the right upper chest, lidocaine was infiltrated along the line of the previous pectoral groove incision and carried down to the layer of the prepectoral fascia using electrocautery and sharp dissection, the pocket was not opened.  We then sought to obtain access to the extrathoracic left subclavian vein, which was accomplished with modest difficulty and required a venogram demonstrating patency of the vein.  We were then able to obtain access using a St. Jude sheath and a Wooley wire.  We were able to cannulate the coronary sinus.  We ended up using Attain II Select System to deploy a St. Jude [id number] LV lead serial number [id number] to a junction between the mid and distal third.  The 2-3 configuration. The Q-LV was approximately 150 milliseconds.  The amplitude was 14.3 with a pace impedance of 770 threshold, 0.5 V at 0.5 milliseconds. There  was no diaphragmatic pacing at 10 V.  This lead was then secured to the prepectoral fascia following removal of the deployment system. At this juncture, we opened up the previously implanted device pocket and extracted the device.  Interrogation of the previously implanted Medtronic [id number] lead demonstrated an amplitude of 9.5 with a pace impedance of 708, a threshold 1.2 V at 0.5 milliseconds.  The leads were then secured to a Medtronic Claria device, serial number [id number]. Through the device, the bipolar R-wave was 10.9 with a pace impedance of 418, a threshold of 1.25 V at 0.8 milliseconds.  The LV impedance was 646, a threshold is 1.0 V at 0.5 milliseconds.  High-voltage impedance was 72 ohms.  Pacing impedance in the RV coil was 412 ohms.  With these acceptable parameters recorded, the leads having been secured to the prepectoral fascia and the device was then placed in the pocket, secured to the prepectoral fascia.  Surgicel was placed at the cephalad aspect of the pocket.  Hemostasis having been obtained, the pocket was copiously irrigated with antibiotic-containing saline solution.  The wound was then closed in 3 layers in normal fashion.  The wound was washed, dried, and a Steri-Strip dressing was applied.  A pressure dressing was also applied.  Needle counts, sponge counts, and instrument counts were correct at the end of the procedure according to the staff.  The  patient tolerated the procedure without apparent complication.     Deboraha Sprang, MD, [facility]     SCK/MEDQ  D:  03/28/2017  T:  03/28/2017  Job:  [id number]

## 2017-03-28 NOTE — Plan of Care (Signed)
Problem: Pain Managment: Goal: General experience of comfort will improve Outcome: Progressing Pt with complaints of "soreness" at site-relieved w/tylenol.

## 2017-03-28 NOTE — Interval H&P Note (Signed)
ICD Criteria  Current LVEF:30%. Within 12 months prior to implant: Yes   Heart failure history: Yes, Class III  Cardiomyopathy history: Yes, Ischemic Cardiomyopathy - Prior MI.  Atrial Fibrillation/Atrial Flutter: Yes, Permanent.  Ventricular tachycardia history: No.  Cardiac arrest history: Yes, Ventricular Tachycardia.  History of syndromes with risk of sudden death: Yes, Other  Previous ICD: Yes, Reason for ICD:  Primary prevention.  Current ICD indication: Secondary  PPM indication: No.   Class I or II Bradycardia indication present: No  Beta Blocker therapy for 3 or more months: Yes, prescribed.   Ace Inhibitor/ARB therapy for 3 or more months: Yes, prescribed.   History and Physical Interval Note:  03/28/2017 7:43 AM  Carl Wood  has presented today for surgery, with the diagnosis of eri  The various methods of treatment have been discussed with the patient and family. After consideration of risks, benefits and other options for treatment, the patient has consented to  Procedure(s): BiV Upgrade (N/A) as a surgical intervention .  The patient's history has been reviewed, patient examined, no change in status, stable for surgery.  I have reviewed the patient's chart and labs.  Questions were answered to the patient's satisfaction.     Virl Axe   Pt with LBBB like with anterior QRS notching suggestive of Q-LV that might be long  reasoable to try, although would not send for surgery Reviewed with pt.

## 2017-03-28 NOTE — Procedures (Signed)
Patient refused CPAP for the night  

## 2017-03-28 NOTE — H&P (View-Only) (Signed)
Electrophysiology Office Note Date: 03/10/2017  ID:  Carl Wood, DOB [birth year], MRN [id number]  PCP: [name], MD Primary Cardiologist: Bensimhon Electrophysiologist: Caryl Comes  CC: ICD at [facility] is a 76 y.o. male seen today for Dr Caryl Comes.  He presents today for routine electrophysiology followup. His ICD has reached ERI and he comes in today to discuss gen change. He is now followed by the AHF clinic.  He denies chest pain, palpitations, worsening dyspnea, PND, orthopnea, nausea, vomiting, dizziness, syncope, edema, weight gain, or early satiety.  He has not had ICD shocks.   Device History: MDT single chamber ICD implanted 1997 for ICM; gen change 2001; gen change 2009 History of appropriate therapy: yes History of AAD therapy: No   Past Medical History:  Diagnosis Date  . CAD (coronary artery disease)   . CHF (congestive heart failure) ([place])   . Chronic atrial fibrillation ([place])   . Colon polyps   . Diabetes mellitus   . Diverticulosis of colon   . DIVERTICULOSIS, COLON 10/16/2006   Qualifier: Diagnosis of  By: Leanne Chang MD, [place]    . Excessive daytime sleepiness 02/19/2016  . Hyperlipidemia   . Hypertension   . MI (mitral incompetence)   . Nephrolithiasis   . NEPHROLITHIASIS 06/26/2008   Qualifier: Diagnosis of  By: Sherlynn Stalls, CMA, [place]    . OSA (obstructive sleep apnea) 02/19/2016   Moderate to severe OSA with an AHI of 25/hr and on CPAP at 8cm H2O  . PE (pulmonary embolism)   . V-tach [facility]    Past Surgical History:  Procedure Laterality Date  . BACK SURGERY    . BASAL CELL CARCINOMA EXCISION     nose  . CARDIAC CATHETERIZATION N/A 01/20/2016   Procedure: Right/Left Heart Cath and Coronary Angiography;  Surgeon: Jolaine Artist, MD;  Location: [facility];  Service: Cardiovascular;  Laterality: N/A;  . CARDIAC DEFIBRILLATOR PLACEMENT     medtronic virtuoso  . CHOLECYSTECTOMY    . COLONOSCOPY  12/11/2012   Procedure: COLONOSCOPY;   Surgeon: Inda Castle, MD;  Location: [facility];  Service: Endoscopy;  Laterality: N/A;  . KNEE ARTHROPLASTY      Current Outpatient Prescriptions  Medication Sig Dispense Refill  . atorvastatin (LIPITOR) 80 MG tablet TAKE ONE TABLET BY MOUTH ONCE DAILY AS DIRECTED 30 tablet 5  . carvedilol (COREG) 6.25 MG tablet TAKE ONE TABLET BY MOUTH TWICE DAILY WITH MEALS 180 tablet 3  . digoxin (LANOXIN) 0.25 MG tablet TAKE ONE-HALF TABLET BY MOUTH ONCE DAILY 45 tablet 2  . ENTRESTO 97-103 MG TAKE ONE TABLET BY MOUTH TWICE DAILY 60 tablet 11  . furosemide (LASIX) 20 MG tablet Take 1 tablet (20 mg total) by mouth as needed (for wt gain of 3 lb in 24 hours). 30 tablet 3  . glimepiride (AMARYL) 4 MG tablet Take 1 tablet (4 mg total) by mouth daily before breakfast. 90 tablet 1  . metFORMIN (GLUCOPHAGE) 500 MG tablet TAKE ONE TABLET BY MOUTH TWICE DAILY WITH MEALS 60 tablet 5  . rivaroxaban (XARELTO) 20 MG TABS tablet Take 1 tablet (20 mg total) by mouth daily with supper. 21 tablet 0   No current facility-administered medications for this visit.     Allergies:   Penicillins and Sulfamethoxazole   Social History: Social History   Social History  . Marital status: Married    Spouse name: [name]  . Number of children: 3  . Years of education: 31  Occupational History  . Retired Retired   Social History Main Topics  . Smoking status: Former Smoker    Packs/day: 1.00    Years: 50.00    Types: Cigarettes    Quit date: 12/15/2012  . Smokeless tobacco: Never Used  . Alcohol use No  . Drug use: No  . Sexual activity: Not on file   Other Topics Concern  . Not on file   Social History Narrative   Married. 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 Colon cancer Father   . Diabetes Father   . Heart disease Father   . Bladder Cancer Mother   . Heart disease Mother     Review of Systems: All other systems reviewed and are otherwise negative except as noted above.   Physical Exam: VS:  BP 118/60   Pulse 76   Ht 6' (1.829 m)   Wt 225 lb 2 oz (102.1 kg)   SpO2 95%   BMI 30.53 kg/m  , BMI Body mass index is 30.53 kg/m.  GEN- The patient is well appearing, alert and oriented x 3 today.   HEENT: normocephalic, atraumatic; sclera clear, conjunctiva pink; hearing intact; oropharynx clear; neck supple  Lungs- Clear to ausculation bilaterally, normal work of breathing.  No wheezes, rales, rhonchi Heart- Regular rate and rhythm  GI- soft, non-tender, non-distended, bowel sounds present  Extremities- no clubbing, cyanosis, or edema  MS- no significant deformity or atrophy Skin- warm and dry, no rash or lesion; ICD pocket well healed Psych- euthymic mood, full affect Neuro- strength and sensation are intact  ICD interrogation- reviewed in detail today,  See PACEART report  EKG:  EKG is ordered today. The ekg ordered today shows atrial fibrillation, V rate 76, IVCD QRS 127msec  Recent Labs: 05/21/2016: Brain Natriuretic Peptide 97.2 11/18/2016: ALT 17; BUN 18; Creatinine, Ser 0.96; Hemoglobin 15.5; Platelets 159.0; Potassium 4.3; Sodium 138   Wt Readings from Last 3 Encounters:  03/10/17 225 lb 2 oz (102.1 kg)  02/24/17 220 lb 12.8 oz (100.2 kg)  11/18/16 225 lb 3.2 oz (102.2 kg)     Other studies Reviewed: Additional studies/ records that were reviewed today include: Dr Caryl Comes and Dr Bensimhon's office notes  Assessment and Plan:  1.  Chronic systolic dysfunction euvolemic today Stable on an appropriate medical regimen ICD at [facility] See Pace Art report No changes today The patient has an IVCD that is not typical left bundle, but with a QRS of >153msec. Dr Olin Pia last note states that he was going  to talk with Dr Haroldine Laws about possible device upgrade. Risks, benefits to ICD generator change were discussed with the patient and his wife today who wish to proceed. I have scheduled as a CRT upgrade with final determination of device at time of procedure If it is not felt that he would benefit from CRT upgrade, could also consider Beat-HF study. I will ask research to go ahead and screen  2.  Permanent atrial fibrillation V rates controlled Continue Xarelto for CHADS2VASC of 7 He was off Coumadin in the past for a prolonged period of time and developed PE. I have only asked him to hold Xarelto night before ICD gen change  3.  CAD No recent ischemic symptoms Continue current therapy    Current medicines are reviewed at length with the patient today.   The patient does not have concerns regarding his medicines.  The following changes were made today:  none  Labs/ tests ordered today include: pre-procedure labs  Orders Placed This Encounter  Procedures  . CUP PACEART [place]  . EKG 12-Lead     Disposition:   Follow up with Dr Caryl Comes following gen change/upgrade    Signed, Chanetta Marshall, NP 03/10/2017 1:21 PM  [facility] [address] [place] [address] [phone] (office) [phone] (fax)

## 2017-03-28 NOTE — Discharge Summary (Signed)
ELECTROPHYSIOLOGY PROCEDURE DISCHARGE SUMMARY    Patient ID: Carl Wood,  MRN: [id number], DOB/AGE: 1941/06/20 76 y.o.  Admit date: 03/28/2017 Discharge date: 03/29/17  Primary Care Physician: [name], MD Primary Cardiologist: [place] Electrophysiologist: Caryl Comes  Primary Discharge Diagnosis:  Chronic systolic heart failure s/p CRTD upgrade this admission  Secondary Discharge Diagnosis:  1.  Permanent atrial fibrillation      On Xarelto 2.  CAD 3.  VT 4.  Hypertension 5.  Hyperlipidemia   Allergies  Allergen Reactions  . Penicillins Other (See Comments)    Patient passed out  . Sulfamethoxazole Rash     Procedures This Admission:  1.  Upgrade of a previously implanted single chamber ICD to a MDT CRTD on 03/28/17 by Dr Caryl Comes.  See op note for full details.  There were no immediate post procedure complications. 2.  CXR on 4/24/187 demonstrated no pneumothorax status post device implantation.   Brief HPI: Carl Wood is a 76 y.o. male with a past medical history as outlined above. His previously implanted ICD has reached ERI.  Because of CHF and QRS >126msc, he was referred for CRTD upgrade.  Risks, benefits, and alternatives to CRT upgrade were reviewed with the patient who wished to proceed.   Hospital Course:  The patient was admitted and underwent implantation of a MDT CRTD with details as outlined above. He was monitored on telemetry overnight which demonstrated atrial fibrillation with ventricular pacing.  Right chest was without hematoma or ecchymosis.  The device was interrogated and found to be functioning normally.  CXR was obtained and demonstrated no pneumothorax status post device implantation.  Wound care, arm mobility, and restrictions were reviewed with the patient.  The patient was examined and considered stable for discharge to home.   The patient's discharge medications include an ARB (Entresto) and beta blocker (Coreg).   This  patients CHA2DS2-VASc Score and unadjusted Ischemic Stroke Rate (% per year) is equal to 11.2 % stroke rate/year from a score of 7 Above score calculated as 1 point each if present [CHF, HTN, DM, Vascular=MI/PAD/Aortic Plaque, Age if 65-74, or Male] Above score calculated as 2 points each if present [Age > 75, or Stroke/TIA/TE]   Physical Exam: Vitals:   03/28/17 2040 03/28/17 2300 03/29/17 0340 03/29/17 0809  BP: 117/64 91/73 (!) 118/49 (!) 115/59  Pulse:  85 80 88  Resp:    18  Temp:   97.8 F (36.6 C) 97.5 F (36.4 C)  TempSrc:   Oral Oral  SpO2:   100% 97%  Weight:   220 lb (99.8 kg)   Height:        GEN- The patient is well appearing, alert and oriented x 3 today.   HEENT: normocephalic, atraumatic; sclera clear, conjunctiva pink; hearing intact; oropharynx clear; neck supple  Lungs- CTA b/l, normal work of breathing.  No wheezes, rales, rhonchi Heart- Regular rate and rhythm (paced), no significant murmurs appreciated GI- soft, non-tender, non-distended Extremities- no clubbing, cyanosis, or edema  Skin- warm and dry, no rash or lesion, right chest without hematoma, minimal ecchymosis Psych- euthymic mood, full affect Neuro- strength and sensation are intact   Labs:   Lab Results  Component Value Date   WBC 4.6 03/23/2017   HGB 15.5 11/18/2016   HCT 44.5 03/23/2017   MCV 99 (H) 03/23/2017   PLT 159 03/23/2017     Recent Labs Lab 03/23/17 0000  NA 139  K 4.5  CL 98  CO2  26  BUN 15  CREATININE 0.84  CALCIUM 9.2  GLUCOSE 298*    Discharge Medications:  Allergies as of 03/29/2017      Reactions   Penicillins Other (See Comments)   Patient passed out   Sulfamethoxazole Rash      Medication List    TAKE these medications   ACCU-CHEK AVIVA PLUS test strip Generic drug:  glucose blood TEST BLOOD SUGAR  DAILY   atorvastatin 80 MG tablet Commonly known as:  LIPITOR TAKE ONE TABLET BY MOUTH ONCE DAILY AS DIRECTED   carvedilol 12.5 MG  tablet Commonly known as:  COREG Take 1 tablet (12.5 mg total) by mouth 2 (two) times daily with a meal. What changed:  See the new instructions.   digoxin 0.25 MG tablet Commonly known as:  LANOXIN TAKE ONE-HALF TABLET BY MOUTH ONCE DAILY   ENTRESTO 97-103 MG Generic drug:  sacubitril-valsartan TAKE ONE TABLET BY MOUTH TWICE DAILY   furosemide 20 MG tablet Commonly known as:  LASIX Take 1 tablet (20 mg total) by mouth as needed (for wt gain of 3 lb in 24 hours).   glimepiride 4 MG tablet Commonly known as:  AMARYL Take 1 tablet (4 mg total) by mouth daily before breakfast.   metFORMIN 500 MG tablet Commonly known as:  GLUCOPHAGE TAKE ONE TABLET BY MOUTH TWICE DAILY WITH MEALS   rivaroxaban 20 MG Tabs tablet Commonly known as:  XARELTO Take 1 tablet (20 mg total) by mouth daily with supper.       Disposition:  Discharge Instructions    Diet - low sodium heart healthy    Complete by:  As directed    Increase activity slowly    Complete by:  As directed      Follow-up Information    [place] [facility] Follow up on 04/07/2017.   Specialty:  Cardiology Why:  at [facility] information: [address] [place]       Virl Axe, MD Follow up on 06/28/2017.   Specialty:  Cardiology Why:  at 1:45PM  Contact information: [address] [place] [address] [phone]        Glori Bickers, MD Follow up on 04/14/2017.   Specialty:  Cardiology Why:  at 10:40AM  Contact information: [place] [address] [phone]           Duration of Discharge Encounter: Greater than 30 minutes including physician time.  Signed, Chanetta Marshall, NP 03/29/2017 8:54 AM

## 2017-03-29 ENCOUNTER — Ambulatory Visit (HOSPITAL_COMMUNITY): Payer: Medicare HMO

## 2017-03-29 DIAGNOSIS — I447 Left bundle-branch block, unspecified: Secondary | ICD-10-CM | POA: Diagnosis not present

## 2017-03-29 DIAGNOSIS — I517 Cardiomegaly: Secondary | ICD-10-CM | POA: Diagnosis not present

## 2017-03-29 DIAGNOSIS — I5022 Chronic systolic (congestive) heart failure: Secondary | ICD-10-CM | POA: Diagnosis not present

## 2017-03-29 DIAGNOSIS — Z880 Allergy status to penicillin: Secondary | ICD-10-CM | POA: Diagnosis not present

## 2017-03-29 DIAGNOSIS — E785 Hyperlipidemia, unspecified: Secondary | ICD-10-CM | POA: Diagnosis not present

## 2017-03-29 DIAGNOSIS — I482 Chronic atrial fibrillation: Secondary | ICD-10-CM | POA: Diagnosis not present

## 2017-03-29 DIAGNOSIS — J9811 Atelectasis: Secondary | ICD-10-CM | POA: Diagnosis not present

## 2017-03-29 DIAGNOSIS — I2510 Atherosclerotic heart disease of native coronary artery without angina pectoris: Secondary | ICD-10-CM | POA: Diagnosis not present

## 2017-03-29 DIAGNOSIS — Z950 Presence of cardiac pacemaker: Secondary | ICD-10-CM | POA: Diagnosis not present

## 2017-03-29 DIAGNOSIS — Z7901 Long term (current) use of anticoagulants: Secondary | ICD-10-CM | POA: Diagnosis not present

## 2017-03-29 DIAGNOSIS — I110 Hypertensive heart disease with heart failure: Secondary | ICD-10-CM | POA: Diagnosis not present

## 2017-03-29 DIAGNOSIS — I255 Ischemic cardiomyopathy: Secondary | ICD-10-CM | POA: Diagnosis not present

## 2017-03-29 LAB — GLUCOSE, CAPILLARY: GLUCOSE-CAPILLARY: 178 mg/dL — AB (ref 65–99)

## 2017-03-29 MED ORDER — CARVEDILOL 12.5 MG PO TABS: 12.5000 mg | ORAL_TABLET | Freq: Two times a day (BID) | ORAL | 6 refills | Status: DC

## 2017-03-29 NOTE — Discharge Instructions (Signed)
° ° °  Supplemental Discharge Instructions for  Pacemaker/Defibrillator Patients  Activity No heavy lifting or vigorous activity with your left/right arm for 6 to 8 weeks.  Do not raise your left/right arm above your head for one week.  Gradually raise your affected arm as drawn below.           __         04/01/17                      04/02/17                 04/03/17                 04/04/17  NO DRIVING for    1 week ; you may begin driving on   1/61/09  .  WOUND CARE - Keep the wound area clean and dry.  Do not get the wound wet, no showers for 1 week  - No bandage is needed on the site.  DO  NOT apply any creams, oils, or ointments to the wound area. - If you notice any drainage or discharge from the wound, any swelling or bruising at the site, or you develop a fever > 101? F after you are discharged home, call the office at once.  Special Instructions - You are still able to use cellular telephones; use the ear opposite the side where you have your pacemaker/defibrillator.  Avoid carrying your cellular phone near your device. - When traveling through airports, show security personnel your identification card to avoid being screened in the metal detectors.  Ask the security personnel to use the hand wand. - Avoid arc welding equipment, MRI testing (magnetic resonance imaging), TENS units (transcutaneous nerve stimulators).  Call the office for questions about other devices. - Avoid electrical appliances that are in poor condition or are not properly grounded. - Microwave ovens are safe to be near or to operate.  Additional information for defibrillator patients should your device go off: - If your device goes off ONCE and you feel fine afterward, notify the device clinic nurses. - If your device goes off ONCE and you do not feel well afterward, call 911. - If your device goes off TWICE, call 911. - If your device goes off THREE times in one day, call 911.  DO NOT DRIVE YOURSELF OR A  FAMILY MEMBER WITH A DEFIBRILLATOR TO THE HOSPITAL--CALL 911.

## 2017-03-29 NOTE — Progress Notes (Signed)
Pt discharged to home via w/c, condition stable, education complete, accompanied by spouse.  Edward Qualia RN

## 2017-03-29 NOTE — Progress Notes (Signed)
Notified by CCMD pt had 4 bts NSVT. Also notified that pt having intermittent episodes where pacer not sensing appropriately and spike hitting in middle of PVC. Strips saved/ will cont to monitor. Jessie Foot, RN

## 2017-04-07 ENCOUNTER — Ambulatory Visit (INDEPENDENT_AMBULATORY_CARE_PROVIDER_SITE_OTHER): Payer: Medicare HMO | Admitting: *Deleted

## 2017-04-07 ENCOUNTER — Ambulatory Visit
Admission: RE | Admit: 2017-04-07 | Discharge: 2017-04-07 | Disposition: A | Discharge status: Home / Self Care | Payer: Medicare HMO | Source: Ambulatory Visit | Attending: Internal Medicine | Admitting: Internal Medicine

## 2017-04-07 DIAGNOSIS — Z95810 Presence of automatic (implantable) cardiac defibrillator: Secondary | ICD-10-CM

## 2017-04-07 DIAGNOSIS — I255 Ischemic cardiomyopathy: Secondary | ICD-10-CM | POA: Diagnosis not present

## 2017-04-07 LAB — CUP PACEART INCLINIC DEVICE CHECK
Brady Statistic AP VP Percent: 0 %
Brady Statistic AP VS Percent: 0 %
Brady Statistic AS VP Percent: 0 %
Brady Statistic RV Percent Paced: 85.46 %
Date Time Interrogation Session: 20180503115412
HIGH POWER IMPEDANCE MEASURED VALUE: 57 Ohm
Implantable Lead Implant Date: 20180423
Implantable Lead Location: 753858
Implantable Lead Model: 6943
Lead Channel Impedance Value: 172.541
Lead Channel Impedance Value: 172.541
Lead Channel Impedance Value: 204.14 Ohm
Lead Channel Impedance Value: 304 Ohm
Lead Channel Impedance Value: 4047 Ohm
Lead Channel Impedance Value: 418 Ohm
Lead Channel Impedance Value: 456 Ohm
Lead Channel Impedance Value: 475 Ohm
Lead Channel Impedance Value: 551 Ohm
Lead Channel Impedance Value: 608 Ohm
Lead Channel Impedance Value: 703 Ohm
Lead Channel Pacing Threshold Amplitude: 1 V
Lead Channel Pacing Threshold Pulse Width: 0.4 ms
Lead Channel Sensing Intrinsic Amplitude: 8.5 mV
Lead Channel Setting Pacing Amplitude: 3.5 V
Lead Channel Setting Sensing Sensitivity: 0.3 mV
MDC IDC LEAD IMPLANT DT: 20010723
MDC IDC LEAD LOCATION: 753860
MDC IDC MSMT BATTERY REMAINING LONGEVITY: 53 mo
MDC IDC MSMT BATTERY VOLTAGE: 3.04 V
MDC IDC MSMT LEADCHNL LV IMPEDANCE VALUE: 176 Ohm
MDC IDC MSMT LEADCHNL LV IMPEDANCE VALUE: 176 Ohm
MDC IDC MSMT LEADCHNL LV IMPEDANCE VALUE: 304 Ohm
MDC IDC MSMT LEADCHNL LV IMPEDANCE VALUE: 304 Ohm
MDC IDC MSMT LEADCHNL LV IMPEDANCE VALUE: 399 Ohm
MDC IDC MSMT LEADCHNL LV IMPEDANCE VALUE: 608 Ohm
MDC IDC MSMT LEADCHNL LV IMPEDANCE VALUE: 608 Ohm
MDC IDC MSMT LEADCHNL LV PACING THRESHOLD AMPLITUDE: 3.75 V
MDC IDC MSMT LEADCHNL RV PACING THRESHOLD PULSEWIDTH: 0.4 ms
MDC IDC PG IMPLANT DT: 20090817
MDC IDC SET LEADCHNL LV PACING AMPLITUDE: 6 V
MDC IDC SET LEADCHNL LV PACING PULSEWIDTH: 0.4 ms
MDC IDC SET LEADCHNL RV PACING PULSEWIDTH: 0.8 ms
MDC IDC STAT BRADY AS VS PERCENT: 0 %
MDC IDC STAT BRADY RA PERCENT PACED: 0 %

## 2017-04-07 NOTE — Patient Instructions (Addendum)
Please go to [place] at [facility] for chest X-ray.   [place], [place] [address]  [phone]

## 2017-04-07 NOTE — Progress Notes (Signed)
Wound check appointment. Steri-strips removed. Wound without redness or edema. Incision edges approximated, wound well healed. Normal device function. RV Threshold, sensing, and impedances consistent with implant measurements. LV threshold elevated at 3.75V @ 0.39ms, impedance stable; programmed output 6.0V @ 0.13ms-- Chest Xray ordered per GT. RV output programmed at 3.5V for extra safety margin until 3 month visit. Histogram distribution appropriate for patient and level of activity. No mode switches or ventricular arrhythmias noted. Patient educated about wound care, arm mobility, lifting restrictions, shock plan. ROV with SK 06/28/17.

## 2017-04-08 ENCOUNTER — Telehealth: Payer: Self-pay | Admitting: *Deleted

## 2017-04-08 NOTE — Telephone Encounter (Signed)
Spoke with patient regarding chest Xray. Per Dr. Caryl Comes chest xray shows LV lead position stable, recommended to schedule patient in device clinic next week to test vector express and reprogram LV vector to optimize intrinsic conduction. Patient verbalized understanding.   Patient scheduled in [facility] 04/12/17 @ 10:00.

## 2017-04-12 ENCOUNTER — Ambulatory Visit (INDEPENDENT_AMBULATORY_CARE_PROVIDER_SITE_OTHER): Payer: Self-pay | Admitting: *Deleted

## 2017-04-12 DIAGNOSIS — I255 Ischemic cardiomyopathy: Secondary | ICD-10-CM

## 2017-04-12 LAB — CUP PACEART INCLINIC DEVICE CHECK
Brady Statistic AP VP Percent: 0 %
Brady Statistic AP VS Percent: 0 %
Brady Statistic AS VS Percent: 0 %
Brady Statistic RV Percent Paced: 84.83 %
Date Time Interrogation Session: 20180508105621
HIGH POWER IMPEDANCE MEASURED VALUE: 58 Ohm
Implantable Lead Implant Date: 20180423
Lead Channel Impedance Value: 169.459
Lead Channel Impedance Value: 169.459
Lead Channel Impedance Value: 342 Ohm
Lead Channel Impedance Value: 4047 Ohm
Lead Channel Impedance Value: 418 Ohm
Lead Channel Impedance Value: 589 Ohm
Lead Channel Impedance Value: 608 Ohm
Lead Channel Impedance Value: 646 Ohm
Lead Channel Pacing Threshold Amplitude: 1.375 V
Lead Channel Pacing Threshold Amplitude: 2 V
Lead Channel Pacing Threshold Pulse Width: 0.4 ms
Lead Channel Pacing Threshold Pulse Width: 1 ms
Lead Channel Sensing Intrinsic Amplitude: 11.25 mV
Lead Channel Sensing Intrinsic Amplitude: 11.25 mV
Lead Channel Setting Pacing Amplitude: 3.5 V
Lead Channel Setting Pacing Amplitude: 5.5 V
Lead Channel Setting Pacing Pulse Width: 0.8 ms
Lead Channel Setting Pacing Pulse Width: 1 ms
MDC IDC LEAD IMPLANT DT: 20010723
MDC IDC LEAD LOCATION: 753858
MDC IDC LEAD LOCATION: 753860
MDC IDC MSMT BATTERY REMAINING LONGEVITY: 36 mo
MDC IDC MSMT BATTERY VOLTAGE: 3.03 V
MDC IDC MSMT LEADCHNL LV IMPEDANCE VALUE: 188.1 Ohm
MDC IDC MSMT LEADCHNL LV IMPEDANCE VALUE: 188.1 Ohm
MDC IDC MSMT LEADCHNL LV IMPEDANCE VALUE: 209 Ohm
MDC IDC MSMT LEADCHNL LV IMPEDANCE VALUE: 285 Ohm
MDC IDC MSMT LEADCHNL LV IMPEDANCE VALUE: 418 Ohm
MDC IDC MSMT LEADCHNL LV IMPEDANCE VALUE: 513 Ohm
MDC IDC MSMT LEADCHNL LV IMPEDANCE VALUE: 646 Ohm
MDC IDC MSMT LEADCHNL LV IMPEDANCE VALUE: 722 Ohm
MDC IDC MSMT LEADCHNL RV IMPEDANCE VALUE: 285 Ohm
MDC IDC MSMT LEADCHNL RV IMPEDANCE VALUE: 456 Ohm
MDC IDC PG IMPLANT DT: 20180423
MDC IDC SET LEADCHNL RV SENSING SENSITIVITY: 0.3 mV
MDC IDC STAT BRADY AS VP PERCENT: 0 %
MDC IDC STAT BRADY RA PERCENT PACED: 0 %

## 2017-04-12 NOTE — Progress Notes (Signed)
LV vectors 1 to 3 tested per SK, due to elevated LV threshold noted 04/07/17. LV1 to LV2 4.25V@ 0.52ms, LV1 to LV3 1.0V @ 0.63ms with D-Stim, LV2 to LV1 4.25V @ 0.20ms, LV2 to LV3 3.0V @ 0.31ms, LV3 to LV1 no capture, LV3 to LV2 no capture, LV2 to LV3 2.0V @ 1.40ms. Reprogrammed LV pulse width to 1.78ms from 0.109ms, per GT.

## 2017-04-14 ENCOUNTER — Ambulatory Visit (HOSPITAL_COMMUNITY)
Admission: RE | Admit: 2017-04-14 | Discharge: 2017-04-14 | Disposition: A | Discharge status: Home / Self Care | Payer: Medicare HMO | Source: Ambulatory Visit | Attending: Internal Medicine | Admitting: Internal Medicine

## 2017-04-14 ENCOUNTER — Encounter (HOSPITAL_COMMUNITY): Payer: Self-pay | Admitting: Internal Medicine

## 2017-04-14 VITALS — BP 116/57 | HR 94 | Ht 72.0 in | Wt 222.8 lb

## 2017-04-14 DIAGNOSIS — Z87891 Personal history of nicotine dependence: Secondary | ICD-10-CM | POA: Insufficient documentation

## 2017-04-14 DIAGNOSIS — I110 Hypertensive heart disease with heart failure: Secondary | ICD-10-CM | POA: Insufficient documentation

## 2017-04-14 DIAGNOSIS — I252 Old myocardial infarction: Secondary | ICD-10-CM | POA: Diagnosis not present

## 2017-04-14 DIAGNOSIS — E119 Type 2 diabetes mellitus without complications: Secondary | ICD-10-CM | POA: Insufficient documentation

## 2017-04-14 DIAGNOSIS — G4733 Obstructive sleep apnea (adult) (pediatric): Secondary | ICD-10-CM | POA: Diagnosis not present

## 2017-04-14 DIAGNOSIS — Z880 Allergy status to penicillin: Secondary | ICD-10-CM | POA: Insufficient documentation

## 2017-04-14 DIAGNOSIS — I482 Chronic atrial fibrillation, unspecified: Secondary | ICD-10-CM

## 2017-04-14 DIAGNOSIS — I2510 Atherosclerotic heart disease of native coronary artery without angina pectoris: Secondary | ICD-10-CM | POA: Diagnosis not present

## 2017-04-14 DIAGNOSIS — Z86711 Personal history of pulmonary embolism: Secondary | ICD-10-CM | POA: Diagnosis not present

## 2017-04-14 DIAGNOSIS — I255 Ischemic cardiomyopathy: Secondary | ICD-10-CM | POA: Diagnosis not present

## 2017-04-14 DIAGNOSIS — Z86010 Personal history of colonic polyps: Secondary | ICD-10-CM | POA: Diagnosis not present

## 2017-04-14 DIAGNOSIS — E785 Hyperlipidemia, unspecified: Secondary | ICD-10-CM | POA: Insufficient documentation

## 2017-04-14 DIAGNOSIS — I5022 Chronic systolic (congestive) heart failure: Secondary | ICD-10-CM | POA: Insufficient documentation

## 2017-04-14 DIAGNOSIS — Z7901 Long term (current) use of anticoagulants: Secondary | ICD-10-CM | POA: Insufficient documentation

## 2017-04-14 DIAGNOSIS — Z7984 Long term (current) use of oral hypoglycemic drugs: Secondary | ICD-10-CM | POA: Diagnosis not present

## 2017-04-14 DIAGNOSIS — F329 Major depressive disorder, single episode, unspecified: Secondary | ICD-10-CM | POA: Diagnosis not present

## 2017-04-14 NOTE — Progress Notes (Signed)
Medication Samples have been provided to the patient.  Drug name: Xarelto       Strength: 20 mg        Qty: 4  LOT: [id number]  Exp.Date: 3/20 and 4/20  Dosing instructions: 1 tab daily  The patient has been instructed regarding the correct time, dose, and frequency of taking this medication, including desired effects and most common side effects.   Carl Wood 11:29 AM 04/14/2017  Medication Samples have been provided to the patient.  Drug name: Delene Loll       Strength: 49/51mg         Qty: 4  LOT: [id number]  Exp.Date: 10/19  Dosing instructions: 2 tabs .Twice daily   The patient has been instructed regarding the correct time, dose, and frequency of taking this medication, including desired effects and most common side effects.   Carl Wood 11:32 AM 04/14/2017

## 2017-04-14 NOTE — Progress Notes (Signed)
Patient ID: Carl Wood, male   DOB: September 29, 1941, 76 y.o.   MRN: [id number]    Advanced Heart Failure Clinic Note   Referring Physician: Dr Yong Channel  Primary Care: Dr Yong Channel  Primary Cardiologist: EP: Dr Caryl Comes  Neurologists: Dr Leonie Man  HPI: Mr Carl Wood is a 76 year old with a history of MI 1983, ICM, chronic systolic heart failure s/p Medtronic single chamber ICD, CVA 2014, DM , VT, PE, ,htn, permanent A fib on Xarelto, former smoker 2014  referred to the HF clinic by Drs Caryl Comes and [facility] for worsening EF and increasing exercise intolerance.   Today he returns for HF follow up Underwent CRT-D upgrade 03/28/17 and says he still can't tell a difference. Says he is tired all the time. Wants to sleep all day. Says he can walk about a 1/4 mile and then has to stop due to thigh fatigue and dyspnea. No CP, orthopnea and PND. Hasn't had to take lasix for a while. Weight stable around 220. No problems with his HF medicines. Wearing CPAP. Dr. Caryl Comes recently double carvedilol. He doesn't think he feels worse.   Echo 10/17 EF 30%  ICD interrogation (done personally): fluid up until end of May. Now back to baseline. Activity level ~4hr. No VT/AF  06/2015 ECHO EF 20% Severely dilated mild TR,  12/2012 ECHO EF 30-35%   07/2015: k 4.0 Creatinine 0.86  11/2015: K 4.4, creatinine 0.91  RHC.LHC 01/20/2016 RA = 9 RV = 37/3/10 PA = 41/16 (25) PCW = 18 Fick cardiac output/index = 4.5/2.1 Ao = 115/55 (74) LV = 119/8/15 PVR = 1.5 WU SVR = 1141 FA sat = 95% PA sat = 59%, 64% Assessment: 1. Severe 1v CAD with high grade mid LAD lesion (anterior wall felt to be non-viable so treated medically)  2. Ischemic CM with EF 25-30% with akinesis of mid anterior wall and aneurysmal deformity of distal anterior wall and apex 3. Normal filling pressures with moderately reduced cardiac output  CPX 08/13/15 FVC 2.64 (58%)    FEV1 1.66 (46%)     FEV1/FVC 63 (82%)     Resting HR: 65 Peak HR: 113  (76% age  predicted max HR) BP rest: 106/60 BP peak: 130/60 Peak VO2: 14.9 (55.2% predicted peak VO2) VE/VCO2 slope: 27.9 OUES: 2.11 Peak RER: 0.95 Ventilatory Threshold: 11.0 (40.8% predicted or measured peak VO2) Peak RR 28 Peak Ventilation: 41.4 VE/MVV: 55.9% PETCO2 at peak: 37 O2pulse: 12  (71% predicted O2pulse)   Past Medical History:  Diagnosis Date  . CAD (coronary artery disease)   . CHF (congestive heart failure) ([place])   . Chronic atrial fibrillation ([place])   . Colon polyps   . Diabetes mellitus   . Diverticulosis of colon   . DIVERTICULOSIS, COLON 10/16/2006   Qualifier: Diagnosis of  By: Leanne Chang MD, [place]    . Excessive daytime sleepiness 02/19/2016  . Hyperlipidemia   . Hypertension   . MI (mitral incompetence)   . Nephrolithiasis   . NEPHROLITHIASIS 06/26/2008   Qualifier: Diagnosis of  By: Sherlynn Stalls, CMA, [place]    . OSA (obstructive sleep apnea) 02/19/2016   Moderate to severe OSA with an AHI of 25/hr and on CPAP at 8cm H2O  . PE (pulmonary embolism)   . V-tach [facility])     Current Outpatient Prescriptions  Medication Sig Dispense Refill  . ACCU-CHEK AVIVA PLUS test strip TEST BLOOD SUGAR  DAILY 100 each 12  . atorvastatin (LIPITOR) 80 MG tablet TAKE ONE TABLET BY MOUTH ONCE  DAILY AS DIRECTED 30 tablet 5  . carvedilol (COREG) 12.5 MG tablet Take 1 tablet (12.5 mg total) by mouth 2 (two) times daily with a meal. 60 tablet 6  . digoxin (LANOXIN) 0.25 MG tablet TAKE ONE-HALF TABLET BY MOUTH ONCE DAILY 45 tablet 2  . ENTRESTO 97-103 MG TAKE ONE TABLET BY MOUTH TWICE DAILY 60 tablet 11  . furosemide (LASIX) 20 MG tablet Take 1 tablet (20 mg total) by mouth as needed (for wt gain of 3 lb in 24 hours). 30 tablet 3  . glimepiride (AMARYL) 4 MG tablet Take 1 tablet (4 mg total) by mouth daily before breakfast. 90 tablet 1  . metFORMIN (GLUCOPHAGE) 500 MG tablet TAKE ONE TABLET BY MOUTH TWICE DAILY WITH MEALS 60 tablet 5  . rivaroxaban (XARELTO) 20 MG TABS tablet Take 1 tablet  (20 mg total) by mouth daily with supper. 21 tablet 0   No current facility-administered medications for this encounter.     Allergies  Allergen Reactions  . Penicillins Other (See Comments)    Patient passed out  . Sulfamethoxazole Rash      Social History   Social History  . Marital status: Married    Spouse name: [name]  . Number of children: 3  . Years of education: 12   Occupational History  . Retired Retired   Social History Main Topics  . Smoking status: Former Smoker    Packs/day: 1.00    Years: 50.00    Types: Cigarettes    Quit date: 12/15/2012  . Smokeless tobacco: Never Used  . Alcohol use No  . Drug use: No  . Sexual activity: Yes    Birth control/ protection: None   Other Topics Concern  . Not on file   Social History Narrative   Married. 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 Colon cancer Father   . Diabetes Father   . Heart disease Father   . Bladder Cancer Mother   . Heart disease Mother     Vitals:   04/14/17 1040  BP: (!) 116/57  Pulse: 94  SpO2: 97%  Weight: 222 lb 12.8 oz (101.1 kg)  Height: 6' (1.829 m)    PHYSICAL EXAM: General:  Well appearing. No resp difficulty HEENT: normal Neck: supple. JVP 6. Carotids 2+ bilat; no bruits. No lymphadenopathy or thryomegaly appreciated. Cor: PMI laterally displaced. Regular rate & rhythm. No rubs, gallops or murmurs. Lungs: clear with mildly decreased BS throughout Abdomen: obese soft, nontender, nondistended. No hepatosplenomegaly. No bruits or masses. Good bowel sounds. Extremities: no cyanosis, clubbing, rash, edema. Warm  Neuro: alert & orientedx3, cranial nerves grossly intact. moves all 4 extremities w/o difficulty. Affect  pleasant   ASSESSMENT & PLAN:  1. Chronic Systolic HF- ICM Medtronic ICD. NYHA II-III. ECHO 06/2015 LVEF 20%, Mild MR decreased from 30-35% in 2014. EF 30% on echo  ICD interrogation today: No VT Activity ~3 hours. Volume below threshold - Overall he is a bit worse. NYHA III. Volume status stable. - Unclear how much of this is HF, deconditioning or depression. - We discussed the option of proceeding with CPX testing to further assess or trying CR first. He wants to try CR if he can  afford and see how he does.  - Continue carvedilol 12.5 mg BID and digoxin 0.125 mg. HR too low to titrate.  - Continue entresto to 97/103 BID - No spiro for now with K 4.5  - 2. OSA - Using CPAP nightly.   3. Former smoker  - quit 2014  -4. Chronic A fib  - Rate controlled. No bleeding on Xarelto. .   5. Lower extremity fatigue : ABIs ok.   6. CAD- Severe 1V Cad - No s/s of ischemia.  On statin, bb, xarelto.   Glori Bickers MD  04/14/2017

## 2017-04-14 NOTE — Patient Instructions (Signed)
Your physician has recommended that you have a cardiopulmonary stress test (CPX). CPX testing is a non-invasive measurement of heart and lung function. It replaces a traditional treadmill stress test. This type of test provides a tremendous amount of information that relates not only to your present condition but also for future outcomes. This test combines measurements of you ventilation, respiratory gas exchange in the lungs, electrocardiogram (EKG), blood pressure and physical response before, during, and following an exercise protocol.  Your physician recommends that you schedule a follow-up appointment in: 3 months

## 2017-04-19 NOTE — Telephone Encounter (Signed)
Called to see if pt wanted to schedule awv - left message.  

## 2017-04-20 ENCOUNTER — Ambulatory Visit (INDEPENDENT_AMBULATORY_CARE_PROVIDER_SITE_OTHER): Payer: Medicare HMO | Admitting: Family Medicine

## 2017-04-20 ENCOUNTER — Encounter: Payer: Self-pay | Admitting: Family Medicine

## 2017-04-20 VITALS — BP 92/60 | HR 72 | Temp 97.9°F | Ht 72.0 in | Wt 221.8 lb

## 2017-04-20 DIAGNOSIS — J3489 Other specified disorders of nose and nasal sinuses: Secondary | ICD-10-CM

## 2017-04-20 DIAGNOSIS — J301 Allergic rhinitis due to pollen: Secondary | ICD-10-CM | POA: Diagnosis not present

## 2017-04-20 MED ORDER — FLUTICASONE PROPIONATE 50 MCG/ACT NA SUSP: 2.0000 | Freq: Every day | NASAL | 3 refills | Status: DC

## 2017-04-20 NOTE — Progress Notes (Signed)
Subjective:  Carl Wood is a 76 y.o. year old very pleasant male patient who presents for/with See problem oriented charting ROS- no fever or chills. No chest pain or shortness of breath. Frontal sinus pressure. No double or blurry vision.    Past Medical History-  Patient Active Problem List   Diagnosis Date Noted  . Chronic systolic heart failure ([place]) 08/06/2015    Priority: High  . Former smoker 11/15/2014    Priority: High  . CVA (cerebral infarction) 12/19/2012    Priority: High  .  ventricular tachycardia-non sustained  02/17/2012    Priority: High  . Cardiomyopathy, ischemic 09/23/2009    Priority: High  . Diabetes mellitus type II, controlled ([place]) 10/16/2006    Priority: High  . CAD (coronary artery disease) 10/16/2006    Priority: High  . Chronic pulmonary embolism ([place]) 10/16/2006    Priority: High  . OSA (obstructive sleep apnea) 02/19/2016    Priority: Medium  . Carotid artery stenosis 03/17/2015    Priority: Medium  . Implantable cardioverter-defibrillator (ICD) in situ 02/17/2012    Priority: Medium  . History of skin cancer 07/05/2007    Priority: Medium  . Hyperlipidemia 10/16/2006    Priority: Medium  . Essential hypertension 10/16/2006    Priority: Medium  . LBBB (left bundle branch block) 08/06/2015    Priority: Low  . Actinic keratosis 11/15/2014    Priority: Low  . Benign neoplasm of colon 12/11/2012    Priority: Low  . Ischemic cardiomyopathy 03/28/2017  . Chronic atrial fibrillation ([facility])     Medications- reviewed and updated Current Outpatient Prescriptions  Medication Sig Dispense Refill  . ACCU-CHEK AVIVA PLUS test strip TEST BLOOD SUGAR  DAILY 100 each 12  . atorvastatin (LIPITOR) 80 MG tablet TAKE ONE TABLET BY MOUTH ONCE DAILY AS DIRECTED 30 tablet 5  . carvedilol (COREG) 12.5 MG tablet Take 1 tablet (12.5 mg total) by mouth 2 (two) times daily with a meal. 60 tablet 6  . digoxin (LANOXIN) 0.25 MG tablet TAKE ONE-HALF TABLET BY  MOUTH ONCE DAILY 45 tablet 2  . ENTRESTO 97-103 MG TAKE ONE TABLET BY MOUTH TWICE DAILY 60 tablet 11  . furosemide (LASIX) 20 MG tablet Take 1 tablet (20 mg total) by mouth as needed (for wt gain of 3 lb in 24 hours). 30 tablet 3  . glimepiride (AMARYL) 4 MG tablet Take 1 tablet (4 mg total) by mouth daily before breakfast. 90 tablet 1  . metFORMIN (GLUCOPHAGE) 500 MG tablet TAKE ONE TABLET BY MOUTH TWICE DAILY WITH MEALS 60 tablet 5  . rivaroxaban (XARELTO) 20 MG TABS tablet Take 1 tablet (20 mg total) by mouth daily with supper. 21 tablet 0   No current facility-administered medications for this visit.     Objective: BP 92/60 (BP Location: Left Arm, Patient Position: Sitting, Cuff Size: Large)   Pulse 72   Temp 97.9 F (36.6 C) (Oral)   Ht 6' (1.829 m)   Wt 221 lb 12.8 oz (100.6 kg)   SpO2 94%   BMI 30.08 kg/m  Gen: NAD, resting comfortably TM normal bilaterally- some cerumen in canal, pharynx with cobblestoning, nares with clear drainage Allergic shingers noted CV: RRR no murmurs rubs or gallops Lungs: CTAB no crackles, wheeze, rhonchi Ext: no edema Skin: warm, dry Grossly normal vision  Assessment/Plan:  Seasonal allergic rhinitis due to pollen  Sinus pressure S:  Bilateral eye pain- 7-10 days around both eyes intermittently through day and especially when he  first wakes up. When he bends over it hurts his head and temple. No discharge from the eyes. No blurry vision. No double vision. Pain usually mild pain- has been more intense.   Bilateral ear discomfort- roaring in both ears and muffled sound starting about a week ago. No pain in ears.   Other than these issues does not feel poorly. Has had runny nose. Mild cough. No fever or shortness of breath or chest pain.   No allergy medicine.  A/P: Suspect tinnitus and decreased hearing from effusion. Also with sneezing, rhinorrhea, sinus pressure- suspect this is related to allergies. Will give 2 week trial of flonase and  zyrtec . If not improved may trial course of prednisone. Avoid decongestant with heart history. If worsening symptoms may get into ENT. Also see avs  2 week follow up  Meds ordered this encounter  Medications  . fluticasone (FLONASE) 50 MCG/ACT nasal spray    Sig: Place 2 sprays into both nostrils daily.    Dispense:  16 g    Refill:  3  new acute issue, medication management required, higher risk patient with age and cardiac history  Return precautions advised.  [name], MD

## 2017-04-20 NOTE — Patient Instructions (Addendum)
I think you are dealing with a lot of sinus congestion related to allergies. Let's try flonase daily and zyrtec or claritin  Or allegra (pick  Up over the counter) daily for 2 weeks then see me back.   If you have worsening symptoms despite this see me sooner. I suspect you will gradually improve over next few days  In particular would want to see you back if fever, blurry vision, changes in vision, shortness of breath, worsening pain.

## 2017-04-23 ENCOUNTER — Other Ambulatory Visit: Payer: Self-pay | Admitting: Family Medicine

## 2017-04-29 ENCOUNTER — Other Ambulatory Visit (HOSPITAL_COMMUNITY): Payer: Self-pay | Admitting: *Deleted

## 2017-04-29 ENCOUNTER — Telehealth (HOSPITAL_COMMUNITY): Payer: Self-pay | Admitting: *Deleted

## 2017-04-29 ENCOUNTER — Ambulatory Visit (HOSPITAL_COMMUNITY): Payer: Medicare HMO | Attending: Internal Medicine

## 2017-04-29 DIAGNOSIS — I5022 Chronic systolic (congestive) heart failure: Secondary | ICD-10-CM | POA: Insufficient documentation

## 2017-04-29 NOTE — Telephone Encounter (Signed)
Patient came by requesting Entresto Samples for 97-103mg   Medication Samples have been provided to the patient.  Drug name: Delene Loll       Strength: 49-51 mg       Qty: 8  LOT: [id number] Exp.Date: 10/19  Dosing instructions: Take 2 Tablets by mouth Twice Daily for your prescribed dose of 97-103 mg.   The patient has been instructed regarding the correct time, dose, and frequency of taking this medication, including desired effects and most common side effects.   Kennieth Rad 10:15 AM 04/29/2017

## 2017-05-05 ENCOUNTER — Encounter: Payer: Self-pay | Admitting: Family Medicine

## 2017-05-05 ENCOUNTER — Ambulatory Visit (INDEPENDENT_AMBULATORY_CARE_PROVIDER_SITE_OTHER): Payer: Medicare HMO | Admitting: Family Medicine

## 2017-05-05 VITALS — BP 90/56 | HR 77 | Temp 97.8°F | Ht 72.0 in | Wt 221.6 lb

## 2017-05-05 DIAGNOSIS — J329 Chronic sinusitis, unspecified: Secondary | ICD-10-CM | POA: Diagnosis not present

## 2017-05-05 DIAGNOSIS — B9689 Other specified bacterial agents as the cause of diseases classified elsewhere: Secondary | ICD-10-CM | POA: Diagnosis not present

## 2017-05-05 MED ORDER — DOXYCYCLINE MONOHYDRATE 100 MG PO TABS: 100.0000 mg | ORAL_TABLET | Freq: Two times a day (BID) | ORAL | 0 refills | Status: DC

## 2017-05-05 NOTE — Progress Notes (Addendum)
Subjective:  Carl Wood is a 76 y.o. year old very pleasant male patient who presents for/with See problem oriented charting ROS- no fever, chills, shortness of breath. Some cough, some throat tickling. Left ear pressure noted.    Past Medical History-  Patient Active Problem List   Diagnosis Date Noted  . Chronic systolic heart failure ([place]) 08/06/2015    Priority: High  . Former smoker 11/15/2014    Priority: High  . CVA (cerebral infarction) 12/19/2012    Priority: High  .  ventricular tachycardia-non sustained  02/17/2012    Priority: High  . Cardiomyopathy, ischemic 09/23/2009    Priority: High  . Diabetes mellitus type II, controlled ([place]) 10/16/2006    Priority: High  . CAD (coronary artery disease) 10/16/2006    Priority: High  . Chronic pulmonary embolism ([place]) 10/16/2006    Priority: High  . OSA (obstructive sleep apnea) 02/19/2016    Priority: Medium  . Carotid artery stenosis 03/17/2015    Priority: Medium  . Implantable cardioverter-defibrillator (ICD) in situ 02/17/2012    Priority: Medium  . History of skin cancer 07/05/2007    Priority: Medium  . Hyperlipidemia 10/16/2006    Priority: Medium  . Essential hypertension 10/16/2006    Priority: Medium  . LBBB (left bundle branch block) 08/06/2015    Priority: Low  . Actinic keratosis 11/15/2014    Priority: Low  . Benign neoplasm of colon 12/11/2012    Priority: Low  . Ischemic cardiomyopathy 03/28/2017  . Chronic atrial fibrillation (HCC)     Medications- reviewed and updated Current Outpatient Prescriptions  Medication Sig Dispense Refill  . ACCU-CHEK AVIVA PLUS test strip TEST BLOOD SUGAR  DAILY 100 each 12  . atorvastatin (LIPITOR) 80 MG tablet TAKE ONE TABLET BY MOUTH ONCE DAILY AS DIRECTED 30 tablet 5  . carvedilol (COREG) 12.5 MG tablet Take 1 tablet (12.5 mg total) by mouth 2 (two) times daily with a meal. 60 tablet 6  . digoxin (LANOXIN) 0.25 MG tablet TAKE ONE-HALF TABLET BY MOUTH ONCE  DAILY 45 tablet 2  . ENTRESTO 97-103 MG TAKE ONE TABLET BY MOUTH TWICE DAILY 60 tablet 11  . fluticasone (FLONASE) 50 MCG/ACT nasal spray Place 2 sprays into both nostrils daily. 16 g 3  . furosemide (LASIX) 20 MG tablet Take 1 tablet (20 mg total) by mouth as needed (for wt gain of 3 lb in 24 hours). 30 tablet 3  . glimepiride (AMARYL) 4 MG tablet Take 1 tablet (4 mg total) by mouth daily before breakfast. 90 tablet 1  . metFORMIN (GLUCOPHAGE) 500 MG tablet TAKE ONE TABLET BY MOUTH TWICE DAILY WITH MEALS 60 tablet 5  . rivaroxaban (XARELTO) 20 MG TABS tablet Take 1 tablet (20 mg total) by mouth daily with supper. 21 tablet 0   No current facility-administered medications for this visit.     Objective: BP (!) 90/56 (BP Location: Left Arm, Patient Position: Standing, Cuff Size: Large)   Pulse 77   Temp 97.8 F (36.6 C) (Oral)   Ht 6' (1.829 m)   Wt 221 lb 9.6 oz (100.5 kg)   SpO2 94%   BMI 30.05 kg/m  Gen: NAD, resting comfortably TM normal bilaterally without clear effusion at present. Mild wax left canal but not obstructive. Turbinates mildly erythematous with some yellow draainage, cobblestoning in throat, pain with palpation of frontal sinsuses CV: RRR no murmurs rubs or gallops Lungs: CTAB no crackles, wheeze, rhonchi Ext: no edema Skin: warm, dry  Diabetic Foot  Exam - Simple   Simple Foot Form Diabetic Foot exam was performed with the following findings:  Yes 05/05/2017  1:56 PM  Visual Inspection See comments:  Yes Sensation Testing Intact to touch and monofilament testing bilaterally:  Yes Pulse Check Posterior Tibialis and Dorsalis pulse intact bilaterally:  Yes Comments Slight callous on end of right toe     Assessment/Plan:  Bacterial sinusitis S: seen 2 weeks ago with sinus pressure behind both eyes, effusion noted bilaterally in ears and with some pressure in both ears. Also had rhinorrhea and mild cough.   Trial of claritin and flonase. 90% improvement right  ear, 25% improvement on the left. Also with roaring sensation in left ear and cant seem to get it to "pop" like he can on right with autoinsufflation. Sinus congestion better- still with pressure in frontal sinuses. He states the left ear, frontal sinus pressure remains the most bothersome A/P: I would continue claritin and flonase s helping. We will add doxycycline for potential bacterial sinusitis. Penicillin allergy, heart issues so would avoid azithromycin. He does not want to use prednisone unless he has to. I have asked him to update me in 10 days- see [place] sooner with new or worsening symptoms.   When I discussed plan with him- he stated he would want to see ENT if symptoms do not improve- strongly does not want to trial prednisone.   Meds ordered this encounter  Medications  . doxycycline (ADOXA) 100 MG tablet    Sig: Take 1 tablet (100 mg total) by mouth 2 (two) times daily.    Dispense:  14 tablet    Refill:  0    Ok to substitute other doxycycline- this was suggested by epic based on insurance    Return precautions advised. PRN follow up [name], MD

## 2017-05-05 NOTE — Patient Instructions (Signed)
I would continue claritin and allegra as helping.   We will add doxycycline for potential bacterial sinusitis for 7 days.  Mr. Laurent does not want to use prednisone unless he has to.   I have asked him to update me in 10 days- see [place] sooner with new or worsening symptoms particularly fever or shortness of breath

## 2017-05-31 ENCOUNTER — Telehealth (HOSPITAL_COMMUNITY): Payer: Self-pay | Admitting: Pharmacist

## 2017-05-31 ENCOUNTER — Telehealth (HOSPITAL_COMMUNITY): Payer: Self-pay | Admitting: *Deleted

## 2017-05-31 NOTE — Telephone Encounter (Signed)
Pt walked in today to request samples of Entresto and Xarelto.  Samples provided   Medication Samples have been provided to the patient.  Drug name: Delene Loll       Strength: 49/51mg         Qty: 2  LOT: [id number]  Exp.Date: 10/19  Dosing instructions: Take 2 tabs Twice daily   The patient has been instructed regarding the correct time, dose, and frequency of taking this medication, including desired effects and most common side effects.   Alvenia Treese 11:20 AM 05/31/2017   Medication Samples have been provided to the patient.  Drug name: Xarelto       Strength: 20mg         Qty: 3  LOT: [id number]  Exp.Date: 12/20  Dosing instructions: Take 1 tab daily  The patient has been instructed regarding the correct time, dose, and frequency of taking this medication, including desired effects and most common side effects.   Hetty Linhart 11:20 AM 05/31/2017  [facility] Pharm D also re-enrolled pt into [employer] foundation for [employer]

## 2017-05-31 NOTE — Telephone Encounter (Signed)
Patient approved for second [employer] for $800.  Eligibility End Date - 08/23/2017  Claims Submission End Date - 12/21/2017   Carlean Jews, Pharm.D. PGY1 Pharmacy Resident 6/26/201811:23 AM Pager [phone]

## 2017-06-02 ENCOUNTER — Other Ambulatory Visit: Payer: Self-pay

## 2017-06-02 ENCOUNTER — Telehealth: Payer: Self-pay | Admitting: Family Medicine

## 2017-06-02 DIAGNOSIS — J329 Chronic sinusitis, unspecified: Secondary | ICD-10-CM

## 2017-06-02 NOTE — Telephone Encounter (Signed)
Referral placed.

## 2017-06-02 NOTE — Telephone Encounter (Signed)
Pt was last seen in may 2018. Pt would like to proceed with the referral to ENT for sinusitis

## 2017-06-02 NOTE — Telephone Encounter (Signed)
Yes thanks may refer 

## 2017-06-07 DIAGNOSIS — J329 Chronic sinusitis, unspecified: Secondary | ICD-10-CM | POA: Diagnosis not present

## 2017-06-07 DIAGNOSIS — H903 Sensorineural hearing loss, bilateral: Secondary | ICD-10-CM | POA: Diagnosis not present

## 2017-06-07 DIAGNOSIS — G44201 Tension-type headache, unspecified, intractable: Secondary | ICD-10-CM | POA: Diagnosis not present

## 2017-06-10 ENCOUNTER — Other Ambulatory Visit: Payer: Self-pay | Admitting: Otolaryngology

## 2017-06-10 DIAGNOSIS — J349 Unspecified disorder of nose and nasal sinuses: Secondary | ICD-10-CM

## 2017-06-10 DIAGNOSIS — R519 Headache, unspecified: Secondary | ICD-10-CM

## 2017-06-10 DIAGNOSIS — R51 Headache: Secondary | ICD-10-CM

## 2017-06-13 ENCOUNTER — Ambulatory Visit
Admission: RE | Admit: 2017-06-13 | Discharge: 2017-06-13 | Disposition: A | Discharge status: Home / Self Care | Payer: Medicare HMO | Source: Ambulatory Visit | Attending: Otolaryngology | Admitting: Otolaryngology

## 2017-06-13 DIAGNOSIS — R519 Headache, unspecified: Secondary | ICD-10-CM

## 2017-06-13 DIAGNOSIS — J329 Chronic sinusitis, unspecified: Secondary | ICD-10-CM | POA: Diagnosis not present

## 2017-06-13 DIAGNOSIS — R51 Headache: Secondary | ICD-10-CM

## 2017-06-13 DIAGNOSIS — J349 Unspecified disorder of nose and nasal sinuses: Secondary | ICD-10-CM

## 2017-06-13 MED ORDER — IOPAMIDOL (ISOVUE-300) INJECTION 61%
75.0000 mL | Freq: Once | INTRAVENOUS | Status: AC | PRN
  Administered 2017-06-13: 75 mL via INTRAVENOUS

## 2017-06-20 ENCOUNTER — Other Ambulatory Visit: Payer: Self-pay | Admitting: Family Medicine

## 2017-06-27 ENCOUNTER — Other Ambulatory Visit: Payer: Self-pay | Admitting: Internal Medicine

## 2017-06-27 ENCOUNTER — Encounter: Payer: Self-pay | Admitting: Family Medicine

## 2017-06-27 ENCOUNTER — Ambulatory Visit (INDEPENDENT_AMBULATORY_CARE_PROVIDER_SITE_OTHER): Payer: Medicare HMO | Admitting: Family Medicine

## 2017-06-27 VITALS — BP 100/56 | HR 55 | Temp 98.1°F | Ht 72.0 in | Wt 224.4 lb

## 2017-06-27 DIAGNOSIS — I482 Chronic atrial fibrillation, unspecified: Secondary | ICD-10-CM

## 2017-06-27 DIAGNOSIS — I5022 Chronic systolic (congestive) heart failure: Secondary | ICD-10-CM

## 2017-06-27 DIAGNOSIS — E119 Type 2 diabetes mellitus without complications: Secondary | ICD-10-CM | POA: Diagnosis not present

## 2017-06-27 LAB — COMPREHENSIVE METABOLIC PANEL
ALBUMIN: 4.1 g/dL (ref 3.5–5.2)
ALT: 17 U/L (ref 0–53)
AST: 14 U/L (ref 0–37)
Alkaline Phosphatase: 63 U/L (ref 39–117)
BILIRUBIN TOTAL: 0.9 mg/dL (ref 0.2–1.2)
BUN: 17 mg/dL (ref 6–23)
CALCIUM: 9.3 mg/dL (ref 8.4–10.5)
CO2: 30 mEq/L (ref 19–32)
CREATININE: 0.91 mg/dL (ref 0.40–1.50)
Chloride: 102 mEq/L (ref 96–112)
GFR: 86.61 mL/min (ref >60.00)
Glucose, Bld: 186 mg/dL — ABNORMAL HIGH (ref 70–99)
Potassium: 4.3 mEq/L (ref 3.5–5.1)
Sodium: 137 mEq/L (ref 135–145)
Total Protein: 6.5 g/dL (ref 6.0–8.3)

## 2017-06-27 LAB — CBC
HCT: 43.7 % (ref 39.0–52.0)
Hemoglobin: 14.8 g/dL (ref 13.0–17.0)
MCHC: 33.9 g/dL (ref 30.0–36.0)
MCV: 99.4 fl (ref 78.0–100.0)
Platelets: 161 10*3/uL (ref 150.0–400.0)
RBC: 4.39 Mil/uL (ref 4.22–5.81)
RDW: 13.4 % (ref 11.5–15.5)
WBC: 5.5 10*3/uL (ref 4.0–10.5)

## 2017-06-27 LAB — HEMOGLOBIN A1C: HEMOGLOBIN A1C: 8.6 % — AB (ref 4.6–6.5)

## 2017-06-27 MED ORDER — METFORMIN HCL 1000 MG PO TABS: 1000.0000 mg | ORAL_TABLET | Freq: Two times a day (BID) | ORAL | 3 refills | Status: DC

## 2017-06-27 NOTE — Telephone Encounter (Signed)
This is a CHF pt 

## 2017-06-27 NOTE — Assessment & Plan Note (Addendum)
S: suspect poorly controlled. On metformin 500mg  BID and amaryl 4mg .  CBGs- 150-220 fasting up from 120-180mg . After meals sometime over 300.  Exercise and diet- Limited exercise. Watching diet. No sugar sweetened beverages.  Lab Results  Component Value Date   HGBA1C 7.3 02/24/2017   HGBA1C 7.8 (H) 11/18/2016   HGBA1C 7.0 07/19/2016   A/P: Titrate metformin to 1 g twice a day. Medication options seem limited due to cost and heart failure history. No recent hospitalization or worsening of heart failure so thought addition of metformin would be reasonable. Januvia not ideal in heart failure. Worried about renal risks on Jardiance given regular Lasix. He states these would be too expensive for him anyway. NPH/R an option. May also increase the glimepiride to 8 mg after getting A1c  See routing note to Dr. Haroldine Laws.

## 2017-06-27 NOTE — Patient Instructions (Addendum)
Increase metformin to 1000mg  twice a day. May need to increase glimepiride as well depending on a1c  Please stop by lab before you go   ______________________________________________________________________  Starting October 1st 2018, I will be transferring to our new location: [place] (corner of [address] [place] [employer]) [place], [place] Phone: [phone]  I would love to have you remain my patient at this new location as long as it remains convenient for you. I am excited about the opportunity to have x-ray and sports medicine in the new building but will really miss the awesome staff and physicians at [facility]. Continue to schedule appointments at [facility] and we will automatically transfer them to the [place] location starting October 1st.

## 2017-06-27 NOTE — Progress Notes (Signed)
Subjective:  Carl Wood is a 76 y.o. year old very pleasant male patient who presents for/with See problem oriented charting ROS- short of breath with walking to his mailbox-at his baseline. Denies chest pain. No increased edema. No weight gain on home scales  Past Medical History-  Patient Active Problem List   Diagnosis Date Noted  . Chronic atrial fibrillation (HCC)     Priority: High  . Chronic systolic heart failure ([place]) 08/06/2015    Priority: High  . Former smoker 11/15/2014    Priority: High  . History of cardioembolic cerebrovascular accident (CVA) 12/19/2012    Priority: High  .  ventricular tachycardia-non sustained  02/17/2012    Priority: High  . Cardiomyopathy, ischemic 09/23/2009    Priority: High  . Diabetes mellitus type II, controlled ([place]) 10/16/2006    Priority: High  . CAD (coronary artery disease) 10/16/2006    Priority: High  . Chronic pulmonary embolism ([place]) 10/16/2006    Priority: High  . OSA (obstructive sleep apnea) 02/19/2016    Priority: Medium  . Carotid artery stenosis 03/17/2015    Priority: Medium  . Implantable cardioverter-defibrillator (ICD) in situ 02/17/2012    Priority: Medium  . History of skin cancer 07/05/2007    Priority: Medium  . Hyperlipidemia 10/16/2006    Priority: Medium  . Essential hypertension 10/16/2006    Priority: Medium  . LBBB (left bundle branch block) 08/06/2015    Priority: Low  . Actinic keratosis 11/15/2014    Priority: Low  . Benign neoplasm of colon 12/11/2012    Priority: Low    Medications- reviewed and updated Current Outpatient Prescriptions  Medication Sig Dispense Refill  . ACCU-CHEK AVIVA PLUS test strip TEST BLOOD SUGAR  DAILY 100 each 12  . atorvastatin (LIPITOR) 80 MG tablet TAKE ONE TABLET BY MOUTH ONCE DAILY AS DIRECTED 30 tablet 5  . carvedilol (COREG) 12.5 MG tablet Take 1 tablet (12.5 mg total) by mouth 2 (two) times daily with a meal. 60 tablet 6  . digoxin (LANOXIN) 0.25 MG  tablet TAKE ONE-HALF TABLET BY MOUTH ONCE DAILY 45 tablet 2  . ENTRESTO 97-103 MG TAKE ONE TABLET BY MOUTH TWICE DAILY 60 tablet 11  . fluticasone (FLONASE) 50 MCG/ACT nasal spray Place 2 sprays into both nostrils daily. 16 g 3  . furosemide (LASIX) 20 MG tablet Take 1 tablet (20 mg total) by mouth as needed (for wt gain of 3 lb in 24 hours). 30 tablet 3  . glimepiride (AMARYL) 4 MG tablet Take 1 tablet (4 mg total) by mouth daily before breakfast. 90 tablet 1  . metFORMIN (GLUCOPHAGE) 500 MG tablet TAKE ONE TABLET BY MOUTH TWICE DAILY WITH MEALS 60 tablet 5  . rivaroxaban (XARELTO) 20 MG TABS tablet Take 1 tablet (20 mg total) by mouth daily with supper. 21 tablet 0   Objective: BP (!) 100/56 (BP Location: Left Arm, Patient Position: Sitting, Cuff Size: Large)   Pulse (!) 55   Temp 98.1 F (36.7 C) (Oral)   Ht 6' (1.829 m)   Wt 224 lb 6.4 oz (101.8 kg)   SpO2 95%   BMI 30.43 kg/m  Gen: NAD, resting comfortably CV: RRR no murmurs rubs or gallops Lungs: CTAB no crackles, wheeze, rhonchi Abdomen: soft/nontender/nondistended/normal bowel sounds. No rebound or guarding.  Ext: Left leg trace edema- right usually doesn't swell And no edema today Skin: warm, dry Neuro: grossly normal, moves all extremities  Assessment/Plan:  ENT said not sinus issue for head pressure.  Pressure leaning forward- advised headache clinic- patient declined through ENT and again today. Discussed prn tylenol option or could refer at a later date- he will consider  Diabetes mellitus type II, controlled ([place]) S: suspect poorly controlled. On metformin 500mg  BID and amaryl 4mg .  CBGs- 150-220 fasting up from 120-180mg . After meals sometime over 300.  Exercise and diet- Limited exercise. Watching diet. No sugar sweetened beverages.  Lab Results  Component Value Date   HGBA1C 7.3 02/24/2017   HGBA1C 7.8 (H) 11/18/2016   HGBA1C 7.0 07/19/2016   A/P: Titrate metformin to 1 g twice a day. Medication options seem  limited due to cost and heart failure history. No recent hospitalization or worsening of heart failure so thought addition of metformin would be reasonable. Januvia not ideal in heart failure. Worried about renal risks on Jardiance given regular Lasix. He states these would be too expensive for him anyway. NPH/R an option. May also increase the glimepiride to 8 mg after getting A1c  See routing note to Dr. Haroldine Laws.   Chronic systolic heart failure (HCC) S: no increased swelling. Weight from 219-223 on home scales stable. Admits to shortness of breath walking to and from mailbox at [facility] with rest A/P: Weight up slightly in office. Patient will monitor closely at home  Chronic atrial fibrillation (HCC) S: rate controlled on coreg, digoxin as well as anticoagulated on xarelto 20mg . Needs some smaples to help keep out of donut hole A/P:  Continue current medicines. Also follows with Dr. Haroldine Laws   Return in about 14 weeks (around 10/03/2017) for follow up- or sooner if needed.  Orders Placed This Encounter  Procedures  . Comprehensive metabolic panel    [place]  . Hemoglobin A1c    [place]  . CBC    [place]    Meds ordered this encounter  Medications  . metFORMIN (GLUCOPHAGE) 1000 MG tablet    Sig: Take 1 tablet (1,000 mg total) by mouth 2 (two) times daily with a meal.    Dispense:  180 tablet    Refill:  3    Return precautions advised.  [name], MD

## 2017-06-27 NOTE — Assessment & Plan Note (Addendum)
S: no increased swelling. Weight from 219-223 on home scales stable. Admits to shortness of breath walking to and from mailbox at [facility] with rest A/P: Weight up slightly in office. Patient will monitor closely at home

## 2017-06-27 NOTE — Assessment & Plan Note (Signed)
S: rate controlled on coreg, digoxin as well as anticoagulated on xarelto 20mg . Needs some smaples to help keep out of donut hole A/P:  Continue current medicines. Also follows with Dr. Haroldine Laws

## 2017-06-28 ENCOUNTER — Encounter: Payer: Self-pay | Admitting: Internal Medicine

## 2017-06-28 ENCOUNTER — Ambulatory Visit (INDEPENDENT_AMBULATORY_CARE_PROVIDER_SITE_OTHER): Payer: Medicare HMO | Admitting: Internal Medicine

## 2017-06-28 ENCOUNTER — Telehealth (HOSPITAL_COMMUNITY): Payer: Self-pay | Admitting: *Deleted

## 2017-06-28 VITALS — BP 90/58 | HR 74 | Ht 72.0 in | Wt 223.0 lb

## 2017-06-28 DIAGNOSIS — I255 Ischemic cardiomyopathy: Secondary | ICD-10-CM

## 2017-06-28 DIAGNOSIS — I472 Ventricular tachycardia, unspecified: Secondary | ICD-10-CM

## 2017-06-28 DIAGNOSIS — Z95810 Presence of automatic (implantable) cardiac defibrillator: Secondary | ICD-10-CM

## 2017-06-28 DIAGNOSIS — I4821 Permanent atrial fibrillation: Secondary | ICD-10-CM

## 2017-06-28 DIAGNOSIS — I482 Chronic atrial fibrillation: Secondary | ICD-10-CM | POA: Diagnosis not present

## 2017-06-28 MED ORDER — SACUBITRIL-VALSARTAN 97-103 MG PO TABS: ORAL_TABLET | ORAL | Status: DC

## 2017-06-28 NOTE — Patient Instructions (Addendum)
Medication Instructions: Your physician has recommended you make the following change in your medication:  1) Decrease entresto 97/103 mg- take 1/2 tablet by mouth twice daily  Labwork: None Ordered  Procedures/Testing: Your physician recommends that you have lab work today: Digoxin  Follow-Up: Your physician wants you to follow-up in 6 MONTHS with Dr. Caryl Comes. You will receive a reminder letter in the mail two months in advance. If you don't receive a letter, please call our office to schedule the follow-up appointment.  Remote monitoring is used to monitor your ICD from home. This monitoring reduces the number of office visits required to check your device to one time per year. It allows [place] to keep an eye on the functioning of your device to ensure it is working properly. You are scheduled for a device check from home on 09/27/17. You may send your transmission at any time that day. If you have a wireless device, the transmission will be sent automatically. After your physician reviews your transmission, you will receive a postcard with your next transmission date.   If you need a refill on your cardiac medications before your next appointment, please call your pharmacy.  2

## 2017-06-28 NOTE — Telephone Encounter (Signed)
Pt came by requesting samples of Xarelto, samples provided  Medication Samples have been provided to the patient.  Drug name: Xarelto       Strength: 20 mg        Qty: 2  LOT: [id number]  Exp.Date: 12/20  Dosing instructions: 1 tab daily  The patient has been instructed regarding the correct time, dose, and frequency of taking this medication, including desired effects and most common side effects.   Ajmal Kathan 1:22 PM 06/28/2017

## 2017-06-28 NOTE — Progress Notes (Signed)
Patient Care Team: Marin Olp, MD as PCP - General (Family Medicine)   HPI  Carl Wood is a 76 y.o. male s seen in followup for ischemic heart disease with ICD implanted for ventricular tachycardia. He has history of bypass grafting and depressed left ventricular function. He has a history of recurrent ventricular tachycardia--most recently polymorphic in september 2101 associated with low-normal magnesium.  He underwent a Myoview scanning demonstrated ejection fraction of 27% without ischemia but with a large LAD infarct.   He has permanent atrial fibrillation.   Echocardiogram 1/14 EF 38-35% 06/2015 ECHO EF 20% Severely dilated mild TR,   2/17  Cath. Severe 1v CAD with high grade mid LAD lesion (anterior wall felt to be non-viable so treated medically)  2. Ischemic CM with EF 25-30% with akinesis of mid anterior wall and aneurysmal deformity of distal anterior wall and apex  He underwent CRT upgrade 4/18.--He had an IVCD but anterior precordial notching.  butn non significaqnt improvement, and his wife says he is may be worse  She also is concerned about his low BP  No bleeding issues.  Thromboembolic risk factors ( age -40 , HTN-1, TIA/CVA-2, DM-1, Vasc disease -1, CHF-1) for a CHADSVASc Score of 7   Past Medical History:  Diagnosis Date  . CAD (coronary artery disease)   . CHF (congestive heart failure) ([place])   . Chronic atrial fibrillation ([place])   . Colon polyps   . Diabetes mellitus   . Diverticulosis of colon   . DIVERTICULOSIS, COLON 10/16/2006   Qualifier: Diagnosis of  By: Leanne Chang MD, [place]    . Excessive daytime sleepiness 02/19/2016  . Hyperlipidemia   . Hypertension   . MI (mitral incompetence)   . Nephrolithiasis   . NEPHROLITHIASIS 06/26/2008   Qualifier: Diagnosis of  By: Sherlynn Stalls, CMA, [place]    . OSA (obstructive sleep apnea) 02/19/2016   Moderate to severe OSA with an AHI of 25/hr and on CPAP at 8cm H2O  . PE (pulmonary embolism)   .  V-tach [facility])     Past Surgical History:  Procedure Laterality Date  . BACK SURGERY    . BASAL CELL CARCINOMA EXCISION     nose  . BIV UPGRADE N/A 03/28/2017   Procedure: BiV Upgrade;  Surgeon: Deboraha Sprang, MD;  Location: [facility];  Service: Cardiovascular;  Laterality: N/A;  . CARDIAC CATHETERIZATION N/A 01/20/2016   Procedure: Right/Left Heart Cath and Coronary Angiography;  Surgeon: Jolaine Artist, MD;  Location: [facility];  Service: Cardiovascular;  Laterality: N/A;  . CARDIAC DEFIBRILLATOR PLACEMENT     medtronic virtuoso  . CHOLECYSTECTOMY    . COLONOSCOPY  12/11/2012   Procedure: COLONOSCOPY;  Surgeon: Inda Castle, MD;  Location: [facility];  Service: Endoscopy;  Laterality: N/A;  . KNEE ARTHROPLASTY      Current Outpatient Prescriptions  Medication Sig Dispense Refill  . ACCU-CHEK AVIVA PLUS test strip TEST BLOOD SUGAR  DAILY 100 each 12  . atorvastatin (LIPITOR) 80 MG tablet TAKE ONE TABLET BY MOUTH ONCE DAILY AS DIRECTED 30 tablet 5  . carvedilol (COREG) 12.5 MG tablet Take 1 tablet (12.5 mg total) by mouth 2 (two) times daily with a meal. 60 tablet 6  . digoxin (LANOXIN) 0.25 MG tablet TAKE ONE-HALF TABLET BY MOUTH ONCE DAILY 45 tablet 2  . ENTRESTO 97-103 MG TAKE ONE TABLET BY MOUTH TWICE DAILY 60 tablet 11  . fluticasone (FLONASE) 50 MCG/ACT  nasal spray Place 2 sprays into both nostrils daily. 16 g 3  . furosemide (LASIX) 20 MG tablet Take 1 tablet (20 mg total) by mouth as needed (for wt gain of 3 lb in 24 hours). 30 tablet 3  . glimepiride (AMARYL) 4 MG tablet Take 1 tablet (4 mg total) by mouth daily before breakfast. 90 tablet 1  . metFORMIN (GLUCOPHAGE) 1000 MG tablet Take 1 tablet (1,000 mg total) by mouth 2 (two) times daily with a meal. 180 tablet 3  . rivaroxaban (XARELTO) 20 MG TABS tablet Take 1 tablet (20 mg total) by mouth daily with supper. 21 tablet 0   No current facility-administered medications for this visit.     Allergies   Allergen Reactions  . Penicillins Other (See Comments)    Patient passed out  . Sulfamethoxazole Rash    Review of Systems negative except from HPI and PMH  Physical Exam BP (!) 90/58   Pulse 74   Ht 6' (1.829 m)   Wt 223 lb (101.2 kg)   SpO2 95%   BMI 30.24 kg/m  Well developed and nourished in no acute distress HENT normal Neck supple with JVP-flat Carotids brisk and full without bruits Clear Regular rate and rhythm, no murmurs or gallops Abd-soft with active BS without hepatomegaly No Clubbing cyanosis edema Skin-warm and dry A & Oriented  Grossly normal sensory and motor function   ECG demonstrates atrial fibrillation with biventricular pacing at 74 bpm  Intervals-/13/40  Rs V1 and QR 1   Assessment and  Plan  Atrial fibrillation-permanent  IVCD  Ischemic cardiomyopathy prior bypass surgery  ICD-Medtronic-CRT   Hypotension  He is no better and perhaps worse following CRT.  His wife wonders whether it may be related to worsening hypotension. He was also implanted for IVCD which has been more response rate. He has 15% VSR related to atrial fibrillation and conduction.  I've increase his resting rate from 70--80. I will take the liberty of decreasing his Entresto from 97/103--49/51.   We'll check his digoxin level.  I'm not sure how we may go about echo optimization although potentially would continue something like AV envelope  If none of these impact performance, I would turn off his LV lead and decreases pacing not withstanding the fact that his QRS duration is 20 ms shorter than it was pre-pacing   More than 50% of 40 min was spent in counseling related to the above

## 2017-06-29 LAB — DIGOXIN LEVEL: Digoxin, Serum: 0.9 ng/mL (ref 0.5–0.9)

## 2017-07-01 NOTE — Telephone Encounter (Signed)
Pt's medication was sent to pt's pharmacy as requested. Confirmation received.  °

## 2017-07-01 NOTE — Telephone Encounter (Signed)
New message     *STAT* If patient is at the pharmacy, call can be transferred to refill team.   1. Which medications need to be refilled? (please list name of each medication and dose if known) digoxin  2. Which pharmacy/location (including street and city if local pharmacy) is medication to be sent to? [facility] on [address]   3. Do they need a 30 day or 90 day supply? 30 day

## 2017-07-05 ENCOUNTER — Telehealth: Payer: Self-pay

## 2017-07-05 NOTE — Telephone Encounter (Signed)
Pt is aware and agreeable to normal results. Also aware that results were sent to PCP.

## 2017-07-15 ENCOUNTER — Ambulatory Visit (HOSPITAL_COMMUNITY)
Admission: RE | Admit: 2017-07-15 | Discharge: 2017-07-15 | Disposition: A | Discharge status: Home / Self Care | Payer: Medicare HMO | Source: Ambulatory Visit | Attending: Internal Medicine | Admitting: Internal Medicine

## 2017-07-15 VITALS — BP 90/48 | HR 90 | Wt 220.5 lb

## 2017-07-15 DIAGNOSIS — I482 Chronic atrial fibrillation: Secondary | ICD-10-CM | POA: Insufficient documentation

## 2017-07-15 DIAGNOSIS — Z9889 Other specified postprocedural states: Secondary | ICD-10-CM | POA: Diagnosis not present

## 2017-07-15 DIAGNOSIS — Z882 Allergy status to sulfonamides status: Secondary | ICD-10-CM | POA: Diagnosis not present

## 2017-07-15 DIAGNOSIS — I252 Old myocardial infarction: Secondary | ICD-10-CM | POA: Diagnosis not present

## 2017-07-15 DIAGNOSIS — Z7901 Long term (current) use of anticoagulants: Secondary | ICD-10-CM | POA: Diagnosis not present

## 2017-07-15 DIAGNOSIS — I2510 Atherosclerotic heart disease of native coronary artery without angina pectoris: Secondary | ICD-10-CM | POA: Insufficient documentation

## 2017-07-15 DIAGNOSIS — Z87891 Personal history of nicotine dependence: Secondary | ICD-10-CM | POA: Insufficient documentation

## 2017-07-15 DIAGNOSIS — E119 Type 2 diabetes mellitus without complications: Secondary | ICD-10-CM | POA: Diagnosis not present

## 2017-07-15 DIAGNOSIS — Z7984 Long term (current) use of oral hypoglycemic drugs: Secondary | ICD-10-CM | POA: Diagnosis not present

## 2017-07-15 DIAGNOSIS — I5022 Chronic systolic (congestive) heart failure: Secondary | ICD-10-CM | POA: Insufficient documentation

## 2017-07-15 DIAGNOSIS — G4733 Obstructive sleep apnea (adult) (pediatric): Secondary | ICD-10-CM | POA: Diagnosis not present

## 2017-07-15 DIAGNOSIS — Z8052 Family history of malignant neoplasm of bladder: Secondary | ICD-10-CM | POA: Insufficient documentation

## 2017-07-15 DIAGNOSIS — I255 Ischemic cardiomyopathy: Secondary | ICD-10-CM | POA: Insufficient documentation

## 2017-07-15 DIAGNOSIS — E785 Hyperlipidemia, unspecified: Secondary | ICD-10-CM | POA: Insufficient documentation

## 2017-07-15 DIAGNOSIS — I219 Acute myocardial infarction, unspecified: Secondary | ICD-10-CM | POA: Insufficient documentation

## 2017-07-15 DIAGNOSIS — I472 Ventricular tachycardia: Secondary | ICD-10-CM | POA: Diagnosis not present

## 2017-07-15 DIAGNOSIS — I110 Hypertensive heart disease with heart failure: Secondary | ICD-10-CM | POA: Insufficient documentation

## 2017-07-15 DIAGNOSIS — Z7902 Long term (current) use of antithrombotics/antiplatelets: Secondary | ICD-10-CM | POA: Insufficient documentation

## 2017-07-15 DIAGNOSIS — Z87442 Personal history of urinary calculi: Secondary | ICD-10-CM | POA: Diagnosis not present

## 2017-07-15 DIAGNOSIS — Z86010 Personal history of colonic polyps: Secondary | ICD-10-CM | POA: Insufficient documentation

## 2017-07-15 DIAGNOSIS — Z800 Family history of malignant neoplasm of digestive organs: Secondary | ICD-10-CM | POA: Diagnosis not present

## 2017-07-15 DIAGNOSIS — Z86711 Personal history of pulmonary embolism: Secondary | ICD-10-CM | POA: Insufficient documentation

## 2017-07-15 DIAGNOSIS — Z8673 Personal history of transient ischemic attack (TIA), and cerebral infarction without residual deficits: Secondary | ICD-10-CM | POA: Diagnosis not present

## 2017-07-15 DIAGNOSIS — Z880 Allergy status to penicillin: Secondary | ICD-10-CM | POA: Diagnosis not present

## 2017-07-15 DIAGNOSIS — Z8249 Family history of ischemic heart disease and other diseases of the circulatory system: Secondary | ICD-10-CM | POA: Insufficient documentation

## 2017-07-15 DIAGNOSIS — Z833 Family history of diabetes mellitus: Secondary | ICD-10-CM | POA: Insufficient documentation

## 2017-07-15 MED ORDER — CARVEDILOL 12.5 MG PO TABS: 6.2500 mg | ORAL_TABLET | Freq: Two times a day (BID) | ORAL | 6 refills | Status: DC

## 2017-07-15 NOTE — Progress Notes (Signed)
Patient ID: Carl Wood, male   DOB: Sep 07, 1941, 76 y.o.   MRN: [id number]    Advanced Heart Failure Clinic Note   Referring Physician: Dr Yong Channel  Primary Care: Dr Yong Channel  Primary Cardiologist: EP: Dr Caryl Comes  Neurologists: Dr Leonie Man  HPI: Carl Wood is a 76 year old with a history of MI 1983, ICM, chronic systolic heart failure s/p Medtronic single chamber ICD, CVA 2014, DM , VT, PE, ,htn, permanent A fib on Xarelto, former smoker 2014  referred to the HF clinic by Drs Caryl Comes and [facility] for worsening EF and increasing exercise intolerance.   Had CRT-D upgrade in April 2018.   Returns today for HF follow up. Last visit was feeling fatigued. He saw Dr. Caryl Comes in July and his resting HR was increased from 70 to 80 bpm. His Entresto was decreased per Dr. Caryl Comes as well to 49/51 mg. Continues to feel fatigued, SOB with walking back to clinic, SOB with stairs.  Says that he can walk throughout stores slowly with some occasional SOB. Taking all medications, weights at home stable at 218-222. He has not taken any lasix recently.  Drinking less than 2L a day.    09/2016 Echo EF 25-30%, trace Carl and TR.  06/2015 ECHO EF 20% Severely dilated mild TR 12/2012 ECHO EF 30-35%   07/2015: k 4.0 Creatinine 0.86  11/2015: K 4.4, creatinine 0.91  RHC.LHC 01/20/2016 RA = 9 RV = 37/3/10 PA = 41/16 (25) PCW = 18 Fick cardiac output/index = 4.5/2.1 Ao = 115/55 (74) LV = 119/8/15 PVR = 1.5 WU SVR = 1141 FA sat = 95% PA sat = 59%, 64% Assessment: 1. Severe 1v CAD with high grade mid LAD lesion (anterior wall felt to be non-viable so treated medically)  2. Ischemic CM with EF 25-30% with akinesis of mid anterior wall and aneurysmal deformity of distal anterior wall and apex 3. Normal filling pressures with moderately reduced cardiac output  CPX 08/13/15 FVC 2.64 (58%)    FEV1 1.66 (46%)     FEV1/FVC 63 (82%)     Resting HR: 65 Peak HR: 113  (76% age predicted max HR) BP rest: 106/60 BP peak:  130/60 Peak VO2: 14.9 (55.2% predicted peak VO2) VE/VCO2 slope: 27.9 OUES: 2.11 Peak RER: 0.95 Ventilatory Threshold: 11.0 (40.8% predicted or measured peak VO2) Peak RR 28 Peak Ventilation: 41.4 VE/MVV: 55.9% PETCO2 at peak: 37 O2pulse: 12  (71% predicted O2pulse)   Past Medical History:  Diagnosis Date  . CAD (coronary artery disease)   . CHF (congestive heart failure) ([place])   . Chronic atrial fibrillation ([place])   . Colon polyps   . Diabetes mellitus   . Diverticulosis of colon   . DIVERTICULOSIS, COLON 10/16/2006   Qualifier: Diagnosis of  By: Leanne Chang MD, [place]    . Excessive daytime sleepiness 02/19/2016  . Hyperlipidemia   . Hypertension   . MI (mitral incompetence)   . Nephrolithiasis   . NEPHROLITHIASIS 06/26/2008   Qualifier: Diagnosis of  By: Sherlynn Stalls, CMA, [place]    . OSA (obstructive sleep apnea) 02/19/2016   Moderate to severe OSA with an AHI of 25/hr and on CPAP at 8cm H2O  . PE (pulmonary embolism)   . V-tach [facility])     Current Outpatient Prescriptions  Medication Sig Dispense Refill  . ACCU-CHEK AVIVA PLUS test strip TEST BLOOD SUGAR  DAILY 100 each 12  . atorvastatin (LIPITOR) 80 MG tablet TAKE ONE TABLET BY MOUTH ONCE DAILY AS DIRECTED 30 tablet  5  . carvedilol (COREG) 12.5 MG tablet Take 1 tablet (12.5 mg total) by mouth 2 (two) times daily with a meal. 60 tablet 6  . digoxin (LANOXIN) 0.25 MG tablet TAKE ONE-HALF TABLET BY MOUTH ONCE DAILY 45 tablet 3  . fluticasone (FLONASE) 50 MCG/ACT nasal spray Place 2 sprays into both nostrils daily. 16 g 3  . furosemide (LASIX) 20 MG tablet Take 1 tablet (20 mg total) by mouth as needed (for wt gain of 3 lb in 24 hours). 30 tablet 3  . glimepiride (AMARYL) 4 MG tablet Take 1 tablet (4 mg total) by mouth daily before breakfast. 90 tablet 1  . metFORMIN (GLUCOPHAGE) 1000 MG tablet Take 1 tablet (1,000 mg total) by mouth 2 (two) times daily with a meal. 180 tablet 3  . rivaroxaban (XARELTO) 20 MG TABS tablet Take 1  tablet (20 mg total) by mouth daily with supper. 21 tablet 0  . sacubitril-valsartan (ENTRESTO) 49-51 MG Take 1 tablet by mouth 2 (two) times daily.     No current facility-administered medications for this encounter.     Allergies  Allergen Reactions  . Penicillins Other (See Comments)    Patient passed out  . Sulfamethoxazole Rash      Social History   Social History  . Marital status: Married    Spouse name: [name]  . Number of children: 3  . Years of education: 12   Occupational History  . Retired Retired   Social History Main Topics  . Smoking status: Former Smoker    Packs/day: 1.00    Years: 50.00    Types: Cigarettes    Quit date: 12/15/2012  . Smokeless tobacco: Never Used  . Alcohol use No  . Drug use: No  . Sexual activity: Yes    Birth control/ protection: None   Other Topics Concern  . Not on file   Social History Narrative   Married. 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 Colon cancer Father   . Diabetes Father   . Heart disease Father   . Bladder Cancer Mother   . Heart disease Mother     Vitals:   07/15/17 1033  BP: (!) 90/48  Pulse: 90  SpO2: 94%  Weight: 220 lb 8 oz (100 kg)    PHYSICAL EXAM: General: Well appearing male, NAD.  HEENT: Normal.  Neck: Supple. JVP 7-8 cm. Carotids 2+ bilat; no bruits. No lymphadenopathy or thryomegaly appreciated. Cor: PMI laterally displace. Regular rate and rhythm.  No rubs, gallops or murmurs. Lungs: Clear but with expiratory wheezing in all lobes, normal effort.  Abdomen:Soft, Non tender, non distended.  No hepatosplenomegaly. No bruits or masses. Good bowel sounds. Extremities: no cyanosis, clubbing, rash. Warm. No edema.  Neuro: Alert and oriented  x 3.  Cranial nerves grossly intact. moves all 4 extremities w/o difficulty. Affect pleasant   ASSESSMENT & PLAN:  1. Chronic Systolic HF: ICM, s/p Medtronic Bi-V ICD. Echo 09/2016 EF 25-30%.  - NYHA III, CPX with moderate HF limitation.  - Volume status stable on exam.  - BP soft today. Reduce Coreg to 6.25 mg BID.  - Continue Entresto 49/51 mg BID.  - No Spiro with history of hyperkalemia and  soft BP.  - Encouraged him to increase activity and walk 20 minutes a day 5 times a week.  - Will repeat CPX test in October, and follow up then. He could be a VAD candidate (RV ok).    2. OSA - Uses CPAP nightly.   3. Former smoker  - Quit in 2014.   4. Chronic A fib - On Xarelto for anticoagulation   5. Lower extremity fatigue: - No PAD on vascular US in 12/2015  6. CAD: - Has known distal LAD lesion 95% and mid LAD 40%. No benefit to revascularization given wall motion abnormality.  - Continue statin and ASA.   CPX test in 2 months and follow up in 2 months.   Arbutus Leas, NP-C   07/15/2017   Patient seen and examined with Jettie Booze, NP. We discussed all aspects of the encounter. I agree with the assessment and plan as stated above.   Continues with NYHA III symptoms despite recent CRT. CPX with at least moderate HF limitation. Will decrease carvedilol and repeat CPX in 3 months. Discussed possibility of VAD down the road. Continue Xarelto for [facility],.   Glori Bickers, MD  11:34 PM

## 2017-07-15 NOTE — Patient Instructions (Signed)
Decrease Carvedilol to 6.25 mg Twice daily   Your physician has recommended that you have a cardiopulmonary stress test (CPX). CPX testing is a non-invasive measurement of heart and lung function. It replaces a traditional treadmill stress test. This type of test provides a tremendous amount of information that relates not only to your present condition but also for future outcomes. This test combines measurements of you ventilation, respiratory gas exchange in the lungs, electrocardiogram (EKG), blood pressure and physical response before, during, and following an exercise protocol.  IN 2 MONTHS  Your physician recommends that you schedule a follow-up appointment in: 2 months

## 2017-07-15 NOTE — Progress Notes (Signed)
Medication Samples have been provided to the patient.  Drug name: Xarelto       Strength: 20mg         Qty: 3  LOT: [id number] Exp.Date: 12/20  Dosing instructions: 1 tab daily  The patient has been instructed regarding the correct time, dose, and frequency of taking this medication, including desired effects and most common side effects.   Carl Wood 10:47 AM 07/15/2017

## 2017-07-20 DIAGNOSIS — E119 Type 2 diabetes mellitus without complications: Secondary | ICD-10-CM | POA: Diagnosis not present

## 2017-07-20 LAB — HM DIABETES EYE EXAM

## 2017-07-22 ENCOUNTER — Telehealth: Payer: Self-pay | Admitting: Cardiology

## 2017-07-22 NOTE — Telephone Encounter (Signed)
°  New Prob   *STAT* If patient is at the pharmacy, call can be transferred to refill team.   1. Which medications need to be refilled? (please list name of each medication and dose if known) Entresto 49-51 mg  2. Which pharmacy/location (including street and city if local pharmacy) is medication to be sent to? [facility] on [address]  3. Do they need a 30 day or 90 day supply? 30 day

## 2017-07-25 MED ORDER — SACUBITRIL-VALSARTAN 49-51 MG PO TABS: 1.0000 | ORAL_TABLET | Freq: Two times a day (BID) | ORAL | 6 refills | Status: DC

## 2017-08-02 ENCOUNTER — Encounter (HOSPITAL_COMMUNITY): Payer: Self-pay | Admitting: *Deleted

## 2017-08-02 ENCOUNTER — Ambulatory Visit (HOSPITAL_COMMUNITY): Payer: Medicare HMO | Attending: Internal Medicine

## 2017-08-02 DIAGNOSIS — I5022 Chronic systolic (congestive) heart failure: Secondary | ICD-10-CM | POA: Diagnosis not present

## 2017-08-02 DIAGNOSIS — J984 Other disorders of lung: Secondary | ICD-10-CM | POA: Diagnosis not present

## 2017-08-02 DIAGNOSIS — E669 Obesity, unspecified: Secondary | ICD-10-CM | POA: Diagnosis not present

## 2017-08-03 ENCOUNTER — Other Ambulatory Visit (HOSPITAL_COMMUNITY): Payer: Self-pay | Admitting: *Deleted

## 2017-08-03 DIAGNOSIS — I5022 Chronic systolic (congestive) heart failure: Secondary | ICD-10-CM | POA: Diagnosis not present

## 2017-08-05 ENCOUNTER — Encounter: Payer: Self-pay | Admitting: Family Medicine

## 2017-08-11 ENCOUNTER — Ambulatory Visit (INDEPENDENT_AMBULATORY_CARE_PROVIDER_SITE_OTHER): Payer: Medicare HMO | Admitting: Family Medicine

## 2017-08-11 ENCOUNTER — Encounter: Payer: Self-pay | Admitting: Family Medicine

## 2017-08-11 VITALS — BP 98/58 | HR 86 | Temp 98.1°F | Ht 72.0 in | Wt 219.0 lb

## 2017-08-11 DIAGNOSIS — Z23 Encounter for immunization: Secondary | ICD-10-CM

## 2017-08-11 DIAGNOSIS — E119 Type 2 diabetes mellitus without complications: Secondary | ICD-10-CM | POA: Diagnosis not present

## 2017-08-11 NOTE — Assessment & Plan Note (Addendum)
S: poorly controlled On last check at that time on metformin 500mg  BID and amaryl 4mg . CBGs had been 150-220 fasting up from prior and after meals over 300.   We increased metformin to 1g Twice a day (loose BM every other day on this- has to get to bathroom quick). Continued on amaryl 4mg  CBGs- morning sugars back down to 120-130. After meals peaking in 190s- down from 300s.  Exercise and diet- Weight down 5 lbs in last month! States has made no significant changes- still not exercising, diet not drastically changed other than eating out less Lab Results  Component Value Date   HGBA1C 8.6 (H) 06/27/2017   HGBA1C 7.3 02/24/2017   HGBA1C 7.8 (H) 11/18/2016   A/P: appears much improved. Continue metformin 1g BID (as long as loose stools dont worsen) and amaryl 4mg .Will target a1c under 8- eating out less plus additional metformin is helping. Encouraged 8 week follow up. Opting not to increase amaryl 4mg  at this point given improvements in cbgs and to avoid risk of hypoglycemia  If affordable- [facility] would be a great choice as well (expense very important to him) as Dr. Haroldine Laws said would be a good option with his CHF.

## 2017-08-11 NOTE — Addendum Note (Signed)
Addended by: Mariam Dollar, Roselyn Reef M on: 08/11/2017 01:07 PM   Modules accepted: Orders

## 2017-08-11 NOTE — Patient Instructions (Addendum)
Stop by schedulers- have them move your 09/27/17 appointment back to November 1st or shortly thereafter  Sugars look way better hopefully we can avoid insulin.   Seems like eating out less is really helping- continue this trend  High dose flu shot today  ______________________________________________________________________  Starting October 1st 2018, I will be transferring to our new location:  I would love to have you remain my patient at this new location as long as it remains convenient for you. I am excited about the opportunity to have x-ray and sports medicine in the new building but will really miss the awesome staff and physicians at [facility]. Continue to schedule appointments at [facility] and we will automatically transfer them to the [place] location starting October 1st.

## 2017-08-11 NOTE — Progress Notes (Signed)
Subjective:  Carl Wood is a 76 y.o. year old very pleasant male patient who presents for/with See problem oriented charting ROS- no hypoglycemia. No chest pain. Baseline shortness of breath. Weight down some   Past Medical History-  Patient Active Problem List   Diagnosis Date Noted  . Chronic atrial fibrillation (HCC)     Priority: High  . Chronic systolic heart failure ([employer]) 08/06/2015    Priority: High  . Former smoker 11/15/2014    Priority: High  . History of cardioembolic cerebrovascular accident (CVA) 12/19/2012    Priority: High  .  ventricular tachycardia-non sustained  02/17/2012    Priority: High  . Cardiomyopathy, ischemic 09/23/2009    Priority: High  . Diabetes mellitus type II, controlled ([place]) 10/16/2006    Priority: High  . CAD (coronary artery disease) 10/16/2006    Priority: High  . Chronic pulmonary embolism ([place]) 10/16/2006    Priority: High  . OSA (obstructive sleep apnea) 02/19/2016    Priority: Medium  . Carotid artery stenosis 03/17/2015    Priority: Medium  . Implantable cardioverter-defibrillator (ICD) in situ 02/17/2012    Priority: Medium  . History of skin cancer 07/05/2007    Priority: Medium  . Hyperlipidemia 10/16/2006    Priority: Medium  . Essential hypertension 10/16/2006    Priority: Medium  . LBBB (left bundle branch block) 08/06/2015    Priority: Low  . Actinic keratosis 11/15/2014    Priority: Low  . Benign neoplasm of colon 12/11/2012    Priority: Low    Medications- reviewed and updated Current Outpatient Prescriptions  Medication Sig Dispense Refill  . ACCU-CHEK AVIVA PLUS test strip TEST BLOOD SUGAR  DAILY 100 each 12  . atorvastatin (LIPITOR) 80 MG tablet TAKE ONE TABLET BY MOUTH ONCE DAILY AS DIRECTED 30 tablet 5  . carvedilol (COREG) 12.5 MG tablet Take 0.5 tablets (6.25 mg total) by mouth 2 (two) times daily with a meal. 60 tablet 6  . digoxin (LANOXIN) 0.25 MG tablet TAKE ONE-HALF TABLET BY MOUTH ONCE DAILY 45  tablet 3  . fluticasone (FLONASE) 50 MCG/ACT nasal spray Place 2 sprays into both nostrils daily. 16 g 3  . furosemide (LASIX) 20 MG tablet Take 1 tablet (20 mg total) by mouth as needed (for wt gain of 3 lb in 24 hours). 30 tablet 3  . glimepiride (AMARYL) 4 MG tablet Take 1 tablet (4 mg total) by mouth daily before breakfast. 90 tablet 1  . metFORMIN (GLUCOPHAGE) 1000 MG tablet Take 1 tablet (1,000 mg total) by mouth 2 (two) times daily with a meal. 180 tablet 3  . rivaroxaban (XARELTO) 20 MG TABS tablet Take 1 tablet (20 mg total) by mouth daily with supper. 21 tablet 0  . sacubitril-valsartan (ENTRESTO) 49-51 MG Take 1 tablet by mouth 2 (two) times daily. 60 tablet 6   No current facility-administered medications for this visit.     Objective: BP (!) 98/58 (BP Location: Left Arm, Patient Position: Sitting, Cuff Size: Large)   Pulse 86   Temp 98.1 F (36.7 C) (Oral)   Ht 6' (1.829 m)   Wt 219 lb (99.3 kg)   SpO2 95%   BMI 29.70 kg/m  Gen: NAD, resting comfortably [id number] rate Lungs: nonlabored Ext: no edema Skin: warm, dry  Assessment/Plan:  Diabetes mellitus type II, controlled ([place]) S: poorly controlled On last check at that time on metformin 500mg  BID and amaryl 4mg . CBGs had been 150-220 fasting up from prior and after meals over  300.   We increased metformin to 1g Twice a day (loose BM every other day on this- has to get to bathroom quick). Continued on amaryl 4mg  CBGs- morning sugars back down to 120-130. After meals peaking in 190s- down from 300s.  Exercise and diet- Weight down 5 lbs in last month! States has made no significant changes- still not exercising, diet not drastically changed other than eating out less Lab Results  Component Value Date   HGBA1C 8.6 (H) 06/27/2017   HGBA1C 7.3 02/24/2017   HGBA1C 7.8 (H) 11/18/2016   A/P: appears much improved. Continue metformin 1g BID (as long as loose stools dont worsen) and amaryl 4mg .Will target a1c under 8-  eating out less plus additional metformin is helping. Encouraged 8 week follow up. Opting not to increase amaryl 4mg  at this point given improvements in cbgs and to avoid risk of hypoglycemia  If affordable- [facility] would be a great choice as well (expense very important to him) as Dr. Haroldine Laws said would be a good option with his CHF.   Asked him to move appt with me back at least 1 day or to early November to make sure a1c covered Future Appointments Date Time Provider [place]  08/22/2017 10:15 AM Magdalen Spatz, NP [facility] None  09/14/2017 1:40 PM Bensimhon, Shaune Pascal, MD [facility] None  09/27/2017 9:45 AM Marin Olp, MD [facility] None  09/27/2017 11:40 AM [facility] DEVICE REMOTES [facility]  10/18/2017 8:20 AM Sueanne Margarita, MD [facility]   8 weeks  Flu shot today  Meds ordered this encounter  Medications  . DISCONTD: ENTRESTO 97-103 MG    Sig: Take 1 tablet by mouth 2 (two) times daily.   The duration of face-to-face time during this visit was greater than 15 minutes. Greater than 50% of this time was spent in counseling, explanation of diagnosis, planning of further management, and/or coordination of care including discussoin of healthy habits to reduce a1c, discussing medication options, cbg and a1c goals  Return precautions advised.  [name], MD

## 2017-08-12 ENCOUNTER — Telehealth (HOSPITAL_COMMUNITY): Payer: Self-pay | Admitting: Pharmacist

## 2017-08-12 NOTE — Telephone Encounter (Signed)
PANF re-opened so renewed application so that Mr. Nordling will have another $800 to use toward his Entresto copays through 08/23/18.   Member ID: [id number] Group ID: [id number] RxBin ID: [id number] PCN: [id number] Eligibility Start Date: 08/24/2017 Eligibility End Date: 08/23/2018 Assistance Amount: $800.00   Doroteo Bradford K. Velva Harman, PharmD, BCPS, CPP Clinical Pharmacist Pager: [phone] Phone: [phone] 08/12/2017 12:43 PM

## 2017-08-22 ENCOUNTER — Encounter: Payer: Self-pay | Admitting: Acute Care

## 2017-08-22 ENCOUNTER — Ambulatory Visit (INDEPENDENT_AMBULATORY_CARE_PROVIDER_SITE_OTHER): Payer: Medicare HMO | Admitting: Acute Care

## 2017-08-22 DIAGNOSIS — Z87891 Personal history of nicotine dependence: Secondary | ICD-10-CM | POA: Diagnosis not present

## 2017-08-22 DIAGNOSIS — J310 Chronic rhinitis: Secondary | ICD-10-CM

## 2017-08-22 DIAGNOSIS — R0609 Other forms of dyspnea: Secondary | ICD-10-CM | POA: Diagnosis not present

## 2017-08-22 MED ORDER — BUDESONIDE-FORMOTEROL FUMARATE 80-4.5 MCG/ACT IN AERO: 2.0000 | INHALATION_SPRAY | Freq: Two times a day (BID) | RESPIRATORY_TRACT | 0 refills | Status: DC

## 2017-08-22 NOTE — Progress Notes (Signed)
History of Present Illness Carl Wood is a 76 y.o. male with CHF, obesity and restrictive lung disease. He is seen    08/22/2017 Follow up for Lung Cancer Screening. Pt. Presents for follow up of annual Lung Cancer Screening. He is due for follow up annual scan 08/2017. He states he has been doing well. He remains smoke free since 2014. He has some complaints of wheezing and shortness of breath with inclines and going up stairs. He is currently not on any therapy for this. He is interested in a therapeutic trial of Symbicort to see if this is effective in relieving his exertional shortness of breath and dyspnea. His most recent  cardiopulmonary stress test done by Dr. Haroldine Laws 07/2017 indicates moderate to severe restrictive properties. Carl Wood denies fever, chest pain, orthopnea, or hemoptysis.  Test Results: Cardiopulmonary exercise test 08/03/2017 Pre-Exercise PFTs   FVC 2.37 (54%)     FEV1 1.47 (44%)      FEV1/FVC 62 (82%)      MVV 65 (50%)   Moderate to severe exercise limitation due to combination of heart failure, obesity and restrictive lung disease.  Low-dose CT lung cancer screening 08/16/2016>> lung RADS 2, follow-up annual CT due September 2018,  Emphysema. Coronary artery atherosclerosis.   CBC Latest Ref Rng & Units 06/27/2017 03/23/2017 11/18/2016  WBC 4.0 - 10.5 K/uL 5.5 4.6 5.5  Hemoglobin 13.0 - 17.0 g/dL 14.8 14.6 15.5  Hematocrit 39.0 - 52.0 % 43.7 44.5 45.4  Platelets 150.0 - 400.0 K/uL 161.0 159 159.0    BMP Latest Ref Rng & Units 06/27/2017 03/23/2017 11/18/2016  Glucose 70 - 99 mg/dL 186(H) 298(H) 328(H)  BUN 6 - 23 mg/dL 17 15 18   Creatinine 0.40 - 1.50 mg/dL 0.91 0.84 0.96  BUN/Creat Ratio 10 - 24 - 18 -  Sodium 135 - 145 mEq/L 137 139 138  Potassium 3.5 - 5.1 mEq/L 4.3 4.5 4.3  Chloride 96 - 112 mEq/L 102 98 102  CO2 19 - 32 mEq/L 30 26 31   Calcium 8.4 - 10.5 mg/dL 9.3 9.2 9.2    BNP    Component Value Date/Time   BNP 97.2  05/21/2016 1627    ProBNP    Component Value Date/Time   PROBNP 170.0 (H) 08/20/2009 1540     Past medical hx Past Medical History:  Diagnosis Date  . CAD (coronary artery disease)   . CHF (congestive heart failure) ([place])   . Chronic atrial fibrillation ([place])   . Colon polyps   . Diabetes mellitus   . Diverticulosis of colon   . DIVERTICULOSIS, COLON 10/16/2006   Qualifier: Diagnosis of  By: Leanne Chang MD, [place]    . Excessive daytime sleepiness 02/19/2016  . Hyperlipidemia   . Hypertension   . MI (mitral incompetence)   . Nephrolithiasis   . NEPHROLITHIASIS 06/26/2008   Qualifier: Diagnosis of  By: Sherlynn Stalls, CMA, [place]    . OSA (obstructive sleep apnea) 02/19/2016   Moderate to severe OSA with an AHI of 25/hr and on CPAP at 8cm H2O  . PE (pulmonary embolism)   . V-tach [facility])      Social History  Substance Use Topics  . Smoking status: Former Smoker    Packs/day: 1.00    Years: 50.00    Types: Cigarettes    Quit date: 12/15/2012  . Smokeless tobacco: Never Used  . Alcohol use No    Carl Wood reports that he quit smoking about 4 years ago. His smoking use included Cigarettes.  He has a 50.00 pack-year smoking history. He has never used smokeless tobacco. He reports that he does not drink alcohol or use drugs.  Tobacco Cessation: Former 50-pack-year smoker quit 2014 I have spent 3 minutes counseling patient on continued smoking cessation this visit.  Past surgical hx, Family hx, Social hx all reviewed.  Current Outpatient Prescriptions on File Prior to Visit  Medication Sig  . ACCU-CHEK AVIVA PLUS test strip TEST BLOOD SUGAR  DAILY  . atorvastatin (LIPITOR) 80 MG tablet TAKE ONE TABLET BY MOUTH ONCE DAILY AS DIRECTED  . carvedilol (COREG) 12.5 MG tablet Take 0.5 tablets (6.25 mg total) by mouth 2 (two) times daily with a meal.  . digoxin (LANOXIN) 0.25 MG tablet TAKE ONE-HALF TABLET BY MOUTH ONCE DAILY  . fluticasone (FLONASE) 50 MCG/ACT nasal spray Place 2 sprays into  both nostrils daily.  . furosemide (LASIX) 20 MG tablet Take 1 tablet (20 mg total) by mouth as needed (for wt gain of 3 lb in 24 hours).  Marland Kitchen glimepiride (AMARYL) 4 MG tablet Take 1 tablet (4 mg total) by mouth daily before breakfast.  . metFORMIN (GLUCOPHAGE) 1000 MG tablet Take 1 tablet (1,000 mg total) by mouth 2 (two) times daily with a meal.  . rivaroxaban (XARELTO) 20 MG TABS tablet Take 1 tablet (20 mg total) by mouth daily with supper.  . sacubitril-valsartan (ENTRESTO) 49-51 MG Take 1 tablet by mouth 2 (two) times daily.   No current facility-administered medications on file prior to visit.      Allergies  Allergen Reactions  . Penicillins Other (See Comments)    Patient passed out  . Sulfamethoxazole Rash    Review Of Systems:  Constitutional:   No  weight loss, night sweats,  Fevers, chills, fatigue, or  lassitude.  HEENT:   No headaches,  Difficulty swallowing,  Tooth/dental problems, or  Sore throat,                No sneezing, itching, ear ache,+ nasal congestion,+ post nasal drip,   CV:  No chest pain,  Orthopnea, PND, swelling in lower extremities, anasarca, dizziness, palpitations, syncope.   GI  No heartburn, indigestion, abdominal pain, nausea, vomiting, diarrhea, change in bowel habits, loss of appetite, bloody stools.   Resp: + shortness of breath with exertion none at rest.  No excess mucus, no productive cough,  No non-productive cough,  No coughing up of blood.  No change in color of mucus.  No wheezing.  No chest wall deformity  Skin: no rash or lesions.  GU: no dysuria, change in color of urine, no urgency or frequency.  No flank pain, no hematuria   MS:  No joint pain or swelling.  No decreased range of motion.  No back pain.  Psych:  No change in mood or affect. No depression or anxiety.  No memory loss.   Vital Signs BP (!) 102/56 (BP Location: Left Arm, Cuff Size: Normal)   Pulse 86   Ht 6' (1.829 m)   Wt 218 lb 3.2 oz (99 kg)   SpO2 95%   BMI  29.59 kg/m    Physical Exam:  General- No distress,  A&Ox3, pleasant ENT: No sinus tenderness, TM clear, pale nasal mucosa, no oral exudate,+ post nasal drip, no LAN Cardiac: S1, S2, regular rate and rhythm, no murmur Chest: No wheeze/ rales/ dullness; no accessory muscle use, no nasal flaring, no sternal retractions Abd.: Soft Non-tender, obese Ext: No clubbing cyanosis, edema Neuro:  normal strength  Skin: No rashes, warm and dry Psych: normal mood and behavior   Assessment/Plan  Rhinitis Nasal congestion with postnasal drip Plan Add  Claritin ( Loratadine) 10 mg daily for post nasal drip. ( Equate brand ok) Please contact office for sooner follow up if symptoms do not improve or worsen or seek emergency care    Former smoker Annual lung cancer screening scan scheduled for September 2018 We will call patient with results Continued counseling regarding maintaining smoking cessation Please contact office for sooner follow up if symptoms do not improve or worsen or seek emergency care   Dyspnea on exertion Patient with complaints of dyspnea on exertion when going up incline or stairs Cardiopulmonary stress test indicates restrictive lung disease, currently untreated Plan We will start a therapeutic trial of Symbicort 2 puffs twice daily  Rinse mouth after use. Let me know if you would like a prescription for Symbicort after trying it. Follow up in 12 months. Please contact office for sooner follow up if symptoms do not improve or worsen or seek emergency care      Magdalen Spatz, NP 08/22/2017  1:52 PM

## 2017-08-22 NOTE — Assessment & Plan Note (Signed)
Nasal congestion with postnasal drip Plan Add  Claritin ( Loratadine) 10 mg daily for post nasal drip. ( Equate brand ok) Please contact office for sooner follow up if symptoms do not improve or worsen or seek emergency care

## 2017-08-22 NOTE — Progress Notes (Signed)
I have reviewed and agree with assessment/plan.  Chesley Mires, MD [facility] Pulmonary/Critical Care 08/22/2017, 2:21 PM Pager:  [phone]

## 2017-08-22 NOTE — Patient Instructions (Signed)
It is good to see you today. Add  Claritin ( Loratadine) 10 mg daily for post nasal drip. ( Equate brand ok) We will start Symbicort 2 puffs twice daily  Rinse mouth after use. We will schedule a CT Chest  for Lung Cancer Screening this month. Let me know if you would like a prescription for Symbicort after trying it. Follow up in 12 months. Please contact office for sooner follow up if symptoms do not improve or worsen or seek emergency care

## 2017-08-22 NOTE — Assessment & Plan Note (Signed)
Annual lung cancer screening scan scheduled for September 2018 We will call patient with results Continued counseling regarding maintaining smoking cessation Please contact office for sooner follow up if symptoms do not improve or worsen or seek emergency care

## 2017-08-22 NOTE — Assessment & Plan Note (Addendum)
Patient with complaints of dyspnea on exertion when going up incline or stairs Cardiopulmonary stress test indicates restrictive lung disease, currently untreated Plan We will start a therapeutic trial of Symbicort 2 puffs twice daily  Rinse mouth after use. Let me know if you would like a prescription for Symbicort after trying it. Follow up in 12 months. Please contact office for sooner follow up if symptoms do not improve or worsen or seek emergency care

## 2017-08-29 ENCOUNTER — Other Ambulatory Visit: Payer: Self-pay | Admitting: Family Medicine

## 2017-08-29 ENCOUNTER — Ambulatory Visit (INDEPENDENT_AMBULATORY_CARE_PROVIDER_SITE_OTHER)
Admission: RE | Admit: 2017-08-29 | Discharge: 2017-08-29 | Disposition: A | Discharge status: Home / Self Care | Payer: Medicare HMO | Source: Ambulatory Visit | Attending: Acute Care | Admitting: Acute Care

## 2017-08-29 DIAGNOSIS — Z87891 Personal history of nicotine dependence: Secondary | ICD-10-CM | POA: Diagnosis not present

## 2017-09-01 ENCOUNTER — Other Ambulatory Visit: Payer: Self-pay | Admitting: Acute Care

## 2017-09-01 DIAGNOSIS — Z87891 Personal history of nicotine dependence: Secondary | ICD-10-CM

## 2017-09-01 DIAGNOSIS — Z122 Encounter for screening for malignant neoplasm of respiratory organs: Secondary | ICD-10-CM

## 2017-09-14 ENCOUNTER — Encounter (HOSPITAL_COMMUNITY): Payer: Self-pay | Admitting: Internal Medicine

## 2017-09-14 ENCOUNTER — Ambulatory Visit (HOSPITAL_COMMUNITY)
Admission: RE | Admit: 2017-09-14 | Discharge: 2017-09-14 | Disposition: A | Discharge status: Home / Self Care | Payer: Medicare HMO | Source: Ambulatory Visit | Attending: Internal Medicine | Admitting: Internal Medicine

## 2017-09-14 VITALS — BP 124/66 | HR 84 | Wt 219.5 lb

## 2017-09-14 DIAGNOSIS — I482 Chronic atrial fibrillation: Secondary | ICD-10-CM | POA: Diagnosis not present

## 2017-09-14 DIAGNOSIS — I252 Old myocardial infarction: Secondary | ICD-10-CM | POA: Insufficient documentation

## 2017-09-14 DIAGNOSIS — I5022 Chronic systolic (congestive) heart failure: Secondary | ICD-10-CM | POA: Diagnosis not present

## 2017-09-14 DIAGNOSIS — E119 Type 2 diabetes mellitus without complications: Secondary | ICD-10-CM | POA: Insufficient documentation

## 2017-09-14 DIAGNOSIS — I2510 Atherosclerotic heart disease of native coronary artery without angina pectoris: Secondary | ICD-10-CM | POA: Insufficient documentation

## 2017-09-14 DIAGNOSIS — E785 Hyperlipidemia, unspecified: Secondary | ICD-10-CM | POA: Insufficient documentation

## 2017-09-14 DIAGNOSIS — Z86010 Personal history of colonic polyps: Secondary | ICD-10-CM | POA: Insufficient documentation

## 2017-09-14 DIAGNOSIS — Z7901 Long term (current) use of anticoagulants: Secondary | ICD-10-CM | POA: Diagnosis not present

## 2017-09-14 DIAGNOSIS — Z87891 Personal history of nicotine dependence: Secondary | ICD-10-CM | POA: Diagnosis not present

## 2017-09-14 DIAGNOSIS — Z86711 Personal history of pulmonary embolism: Secondary | ICD-10-CM | POA: Insufficient documentation

## 2017-09-14 DIAGNOSIS — Z8673 Personal history of transient ischemic attack (TIA), and cerebral infarction without residual deficits: Secondary | ICD-10-CM | POA: Diagnosis not present

## 2017-09-14 DIAGNOSIS — G4733 Obstructive sleep apnea (adult) (pediatric): Secondary | ICD-10-CM | POA: Insufficient documentation

## 2017-09-14 DIAGNOSIS — I4821 Permanent atrial fibrillation: Secondary | ICD-10-CM

## 2017-09-14 DIAGNOSIS — I219 Acute myocardial infarction, unspecified: Secondary | ICD-10-CM | POA: Diagnosis not present

## 2017-09-14 DIAGNOSIS — I110 Hypertensive heart disease with heart failure: Secondary | ICD-10-CM | POA: Insufficient documentation

## 2017-09-14 DIAGNOSIS — I255 Ischemic cardiomyopathy: Secondary | ICD-10-CM | POA: Diagnosis not present

## 2017-09-14 DIAGNOSIS — Z7984 Long term (current) use of oral hypoglycemic drugs: Secondary | ICD-10-CM | POA: Insufficient documentation

## 2017-09-14 LAB — BASIC METABOLIC PANEL
Anion gap: 7 (ref 5–15)
BUN: 18 mg/dL (ref 6–20)
CO2: 26 mmol/L (ref 22–32)
CREATININE: 0.91 mg/dL (ref 0.61–1.24)
Calcium: 9.5 mg/dL (ref 8.9–10.3)
Chloride: 105 mmol/L (ref 101–111)
GFR calc Af Amer: >60 mL/min (ref >60)
GFR calc non Af Amer: >60 mL/min (ref >60)
Glucose, Bld: 232 mg/dL — ABNORMAL HIGH (ref 65–99)
Potassium: 4.8 mmol/L (ref 3.5–5.1)
Sodium: 138 mmol/L (ref 135–145)

## 2017-09-14 MED ORDER — FUROSEMIDE 20 MG PO TABS: 20.0000 mg | ORAL_TABLET | ORAL | 3 refills | Status: DC

## 2017-09-14 NOTE — Progress Notes (Signed)
Patient ID: Carl Wood, male   DOB: 11-Feb-1941, 76 y.o.   MRN: [id number]    Advanced Heart Failure Clinic Note   Referring Physician: Dr Carl Wood  Primary Care: Dr Carl Wood  Primary Cardiologist: EP: Dr Carl Wood  Neurologists: Dr Carl Wood  HPI: Mr Carl Wood is a 76 year old with a history of MI 1983, ICM, chronic systolic heart failure s/p Medtronic single chamber ICD, CVA 2014, DM , VT, PE, ,htn, permanent A fib on Xarelto, former smoker 2014  referred to the HF clinic by Drs Carl Wood and Carl Wood for worsening EF and increasing exercise intolerance.   Had CRT-D upgrade in April 2018.   Returns today for HF follow up. Says doesn't notice much benefit from CRT but wife says he seems to be more active. Weight stable takes lasix about every 10 days for swelling or when weight up 3 pounds. Can do ADLs and mow yard without too much problem. Goes to store with wife. Weight stable around 219.  ICD interrogated personally in clinic: No VT/AF. Optivol up and now coming down. Vpacing ~ 80% time  Personally reviewed   09/2016 Echo EF 25-30%, trace MR and TR.  06/2015 ECHO EF 20% Severely dilated mild TR 12/2012 ECHO EF 30-35%   07/2015: k 4.0 Creatinine 0.86  11/2015: K 4.4, creatinine 0.91    CPX 08/03/17 (bike)  FVC 2.37 (54%)    FEV1 1.47 (44%)     FEV1/FVC 62 (82%)     MVV 65 (50%)     Resting HR: 82 Peak HR: 107  (73% age predicted max HR) BP rest: 90/56 BP peak: 124/66 Peak VO2: 13.0 (62% predicted peak VO2) VE/VCO2 slope: 34 OUES: 1.73 Peak RER: 1.04 Ventilatory Threshold: 10.3 (49% predicted or measured peak VO2) VE/MVV: 75% O2pulse: 12  (86% predicted O2pulse)   CPX 04/29/17 (TM)  FVC 2.60 (59%)    FEV1 1.54 (46%)     FEV1/FVC 59 (79%)     MVV 64 (48%)     BP rest: 80/58 BP peak: 122/64 Peak VO2: 14.5 (55% predicted peak VO2) VE/VCO2 slope: 31 OUES: 2.05 Peak RER: 1.04 Ventilatory Threshold: 12.2 (47% predicted or measured peak VO2) Peak RR  34 Peak Ventilation: 50.8 VE/MVV: 79% PETCO2 at peak: 34 O2pulse: 14  (88% predicted O2pulse)   RHC.LHC 01/20/2016 RA = 9 RV = 37/3/10 PA = 41/16 (25) PCW = 18 Fick cardiac output/index = 4.5/2.1 Ao = 115/55 (74) LV = 119/8/15 PVR = 1.5 WU SVR = 1141 FA sat = 95% PA sat = 59%, 64% Assessment: 1. Severe 1v CAD with high grade mid LAD lesion (anterior wall felt to be non-viable so treated medically)  2. Ischemic CM with EF 25-30% with akinesis of mid anterior wall and aneurysmal deformity of distal anterior wall and apex 3. Normal filling pressures with moderately reduced cardiac output  CPX 08/13/15 FVC 2.64 (58%)    FEV1 1.66 (46%)     FEV1/FVC 63 (82%)     Resting HR: 65 Peak HR: 113  (76% age predicted max HR) BP rest: 106/60 BP peak: 130/60 Peak VO2: 14.9 (55.2% predicted peak VO2) VE/VCO2 slope: 27.9 OUES: 2.11 Peak RER: 0.95 Ventilatory Threshold: 11.0 (40.8% predicted or measured peak VO2) Peak RR 28 Peak Ventilation: 41.4 VE/MVV: 55.9% PETCO2 at peak: 37 O2pulse: 12  (71% predicted O2pulse)   Past Medical History:  Diagnosis Date  . CAD (coronary artery disease)   . CHF (congestive heart failure) ([place])   . Chronic atrial fibrillation (  [facility])   . Colon polyps   . Diabetes mellitus   . Diverticulosis of colon   . DIVERTICULOSIS, COLON 10/16/2006   Qualifier: Diagnosis of  By: Carl Wood    . Excessive daytime sleepiness 02/19/2016  . Hyperlipidemia   . Hypertension   . MI (mitral incompetence)   . Nephrolithiasis   . NEPHROLITHIASIS 06/26/2008   Qualifier: Diagnosis of  By: Carl Wood    . OSA (obstructive sleep apnea) 02/19/2016   Moderate to severe OSA with an AHI of 25/hr and on CPAP at 8cm H2O  . PE (pulmonary embolism)   . V-tach [facility])     Current Outpatient Prescriptions  Medication Sig Dispense Refill  . ACCU-CHEK AVIVA PLUS test strip TEST BLOOD SUGAR  DAILY 100 each 12  . atorvastatin (LIPITOR) 80 MG tablet  TAKE ONE TABLET BY MOUTH ONCE DAILY AS DIRECTED 30 tablet 5  . budesonide-formoterol (SYMBICORT) 80-4.5 MCG/ACT inhaler Inhale 2 puffs into the lungs 2 (two) times daily. 1 Inhaler 0  . carvedilol (COREG) 12.5 MG tablet Take 0.5 tablets (6.25 mg total) by mouth 2 (two) times daily with a meal. 60 tablet 6  . digoxin (LANOXIN) 0.25 MG tablet TAKE ONE-HALF TABLET BY MOUTH ONCE DAILY 45 tablet 3  . fluticasone (FLONASE) 50 MCG/ACT nasal spray Place 2 sprays into both nostrils daily. 16 g 3  . furosemide (LASIX) 20 MG tablet Take 1 tablet (20 mg total) by mouth as needed (for wt gain of 3 lb in 24 hours). 30 tablet 3  . glimepiride (AMARYL) 4 MG tablet TAKE ONE TABLET BY MOUTH ONCE DAILY BEFORE BREAKFAST 90 tablet 1  . metFORMIN (GLUCOPHAGE) 1000 MG tablet Take 1 tablet (1,000 mg total) by mouth 2 (two) times daily with a meal. 180 tablet 3  . rivaroxaban (XARELTO) 20 MG TABS tablet Take 1 tablet (20 mg total) by mouth daily with supper. 21 tablet 0  . sacubitril-valsartan (ENTRESTO) 49-51 MG Take 1 tablet by mouth 2 (two) times daily. 60 tablet 6   No current facility-administered medications for this encounter.     Allergies  Allergen Reactions  . Penicillins Other (See Comments)    Patient passed out  . Sulfamethoxazole Rash      Social History   Social History  . Marital status: Married    Spouse name: Carl Wood  . Number of children: 3  . Years of education: 12   Occupational History  . Retired Retired   Social History Main Topics  . Smoking status: Former Smoker    Packs/day: 1.00    Years: 50.00    Types: Cigarettes    Quit date: 12/15/2012  . Smokeless tobacco: Never Used  . Alcohol use No  . Drug use: No  . Sexual activity: Yes    Birth control/ protection: None   Other Topics Concern  . Not on file   Social History Narrative   Married. 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 Colon cancer Father   . Diabetes Father   . Heart disease Father   . Bladder Cancer Mother   . Heart disease Mother     Vitals:   09/14/17 1343  BP: 124/66  Pulse:  84  SpO2: 96%  Weight: 219 lb 8 oz (99.6 kg)    PHYSICAL EXAM: General:  Well appearing. No resp difficulty HEENT: normal Neck: supple. JVP 7. Carotids 2+ bilat; no bruits. No lymphadenopathy or thryomegaly appreciated. Cor: PMI laterally displaced. Regular rate & rhythm. No rubs, gallops or murmurs. Lungs: clear Abdomen: soft, nontender, nondistended. No hepatosplenomegaly. No bruits or masses. Good bowel sounds. Extremities: no cyanosis, clubbing, rash, edema Neuro: alert & orientedx3, cranial nerves grossly intact. moves all 4 extremities w/o difficulty. Affect pleasant   ASSESSMENT & PLAN:  1. Chronic Systolic HF: ICM, s/p Medtronic Bi-V ICD. Echo 09/2016 EF 25-30%.  - Stable NYHA II-III. - CPX testing reviewed with him and his wife. pVO2 down 14.5->13.0 but he feels better and is able to do all ADLs and his yard work without too much difficulty - Will continue to follow closely. If he gets worse can consider VAD (RV ok on echo)  - Volume status looks good on exam but up and down by Optivol (ICD interrogated personally in clinic). Will have him take lasix once a week + PRN.  Reinforced need for daily weights and reviewed use of sliding scale diuretics. - Continue Coreg to 6.25 mg BID. (recently reduced from 12.5 bid) - Continue Entresto 49/51 mg BID. Was previosuly on full dose but had to cut back.  - No Spiro with history of hyperkalemia and soft BP.  - Labs today   2. OSA - Uses CPAP nightly.   3. Former smoker  - Quit in 2014.   4. Chronic A fib - Rate controlled. On Xarelto for anticoagulation. No significant bleeding.  5. Lower  extremity fatigue: - No PAD on vascular US in 12/2015  6. CAD: - Has known distal LAD lesion 95% and mid LAD 40%. No benefit to revascularization given wall motion abnormality.  - No s/s ischemia. Continue statin and ASA.    Glori Bickers, Wood  09/14/2017

## 2017-09-14 NOTE — Patient Instructions (Addendum)
Routine lab work today. Will notify you of abnormal results, otherwise no news is good news!  CHANGE Lasix to 20 mg tablet once weekly.  Follow up 3-4 months with Dr. Haroldine Laws. We will call you closer to this time, or you may call our office to schedule 1 month before you are due to be seen. Take all medication as prescribed the day of your appointment. Bring all medications with you to your appointment.  Do the following things EVERYDAY: 1) Weigh yourself in the morning before breakfast. Write it down and keep it in a log. 2) Take your medicines as prescribed 3) Eat low salt foods-Limit salt (sodium) to 2000 mg per day.  4) Stay as active as you can everyday 5) Limit all fluids for the day to less than 2 liters

## 2017-09-27 ENCOUNTER — Ambulatory Visit: Payer: Medicare HMO | Admitting: Family Medicine

## 2017-09-27 ENCOUNTER — Encounter: Payer: Medicare HMO | Admitting: *Deleted

## 2017-09-27 ENCOUNTER — Other Ambulatory Visit: Payer: Self-pay | Admitting: Internal Medicine

## 2017-09-29 ENCOUNTER — Encounter: Payer: Self-pay | Admitting: Cardiology

## 2017-10-05 ENCOUNTER — Telehealth (HOSPITAL_COMMUNITY): Payer: Self-pay | Admitting: Pharmacist

## 2017-10-05 NOTE — Telephone Encounter (Signed)
Entresto 49-51 mg BID PA approved by [employer] Part D through 10/04/19.   Ruta Hinds. Velva Harman, PharmD, BCPS, CPP Clinical Pharmacist Pager: [phone] Phone: [phone] 10/05/2017 9:15 AM

## 2017-10-06 ENCOUNTER — Ambulatory Visit (INDEPENDENT_AMBULATORY_CARE_PROVIDER_SITE_OTHER): Payer: Medicare HMO | Admitting: Family Medicine

## 2017-10-06 ENCOUNTER — Telehealth: Payer: Self-pay | Admitting: Family Medicine

## 2017-10-06 ENCOUNTER — Ambulatory Visit (INDEPENDENT_AMBULATORY_CARE_PROVIDER_SITE_OTHER): Payer: Medicare HMO | Admitting: *Deleted

## 2017-10-06 ENCOUNTER — Encounter: Payer: Self-pay | Admitting: Family Medicine

## 2017-10-06 VITALS — BP 100/66 | HR 89 | Temp 97.5°F | Ht 72.0 in | Wt 220.2 lb

## 2017-10-06 DIAGNOSIS — I10 Essential (primary) hypertension: Secondary | ICD-10-CM | POA: Diagnosis not present

## 2017-10-06 DIAGNOSIS — E785 Hyperlipidemia, unspecified: Secondary | ICD-10-CM

## 2017-10-06 DIAGNOSIS — E119 Type 2 diabetes mellitus without complications: Secondary | ICD-10-CM

## 2017-10-06 DIAGNOSIS — R51 Headache: Secondary | ICD-10-CM | POA: Diagnosis not present

## 2017-10-06 DIAGNOSIS — I472 Ventricular tachycardia, unspecified: Secondary | ICD-10-CM

## 2017-10-06 DIAGNOSIS — R519 Headache, unspecified: Secondary | ICD-10-CM

## 2017-10-06 LAB — POCT GLYCOSYLATED HEMOGLOBIN (HGB A1C): HEMOGLOBIN A1C: 7

## 2017-10-06 NOTE — Telephone Encounter (Signed)
Patient called in reference to referral to [facility] neurology and patient stated they did not have anything available until January. Patient stated [place] neurology, Dr. Leonie Man has an available appointment on 11/5 or 11/6 but would need a referral from Dr. Yong Channel. Please advise.

## 2017-10-06 NOTE — Assessment & Plan Note (Signed)
S: controlled on coreg 6.25mg  BID, lasix 20mg  once a week, entresto 49-51mg .  BP Readings from Last 3 Encounters:  10/06/17 100/66  09/14/17 124/66  08/22/17 (!) 102/56  A/P: We discussed blood pressure goal of <140/90. He is below goal but needs above meds for heart failure so will continue

## 2017-10-06 NOTE — Assessment & Plan Note (Signed)
S: muchimproved controlled. On metformin 1g BID, amaryl 4mg . We had tried jardiance addition but was not affordable- in donut hole.  CBGs- usually around 130 but up to 180 or so. No lo wblood sugars.  Exercise and diet- weight stable today but had lost 5 lbs previously Lab Results  Component Value Date   HGBA1C 7.0 10/06/2017   HGBA1C 8.6 (H) 06/27/2017   HGBA1C 7.3 02/24/2017   A/P: improved control- continue current medicines. Could consider amaryl 8mg  as would be more affordable

## 2017-10-06 NOTE — Patient Instructions (Addendum)
Great news! a1c down to 7.0. Continue current medications  Refer you to neurology- should hear back within a week or two, call us if not  No changes in medications

## 2017-10-06 NOTE — Assessment & Plan Note (Signed)
S: well controlled on atorvastatin 80mg  with last LDL at 73 (though under 70 even more ideal). No myalgias.   A/P: continue max dose statin

## 2017-10-06 NOTE — Assessment & Plan Note (Signed)
S:  Patient with headaches now for over 4-5 months- typically frontal. Prior associated with some sinus congestion. He was referred to ENT Dr. Lucia Gaskins who saw no congestion. Had CT sinuses with Dr. Lucia Gaskins and was told they were clear.Every morning has eye discomfort- eye doctor says everything is ok.  Can last all day at times. 2/10 pain right now- cough, sneeze bend over gets temple pain that worsens.  Has also seen optho who has told him not associated with his eyes/vision.  A/P: we discussed potential imaging with MRI brain but he has pacemaker-defibrillator- with workup not showing sinus issues- suspect this is more of a recurrent frontal headache. He asks about neurology referral and this is reasonable- refer today

## 2017-10-06 NOTE — Progress Notes (Signed)
Subjective:  Carl Wood is a 76 y.o. year old very pleasant male patient who presents for/with See problem oriented charting ROS- no hypoglycemia. No chest pain. Baseline SOB. No increased edema.    Past Medical History-  Patient Active Problem List   Diagnosis Date Noted  . Chronic atrial fibrillation (HCC)     Priority: High  . Chronic systolic heart failure ([place]) 08/06/2015    Priority: High  . Former smoker 11/15/2014    Priority: High  . History of cardioembolic cerebrovascular accident (CVA) 12/19/2012    Priority: High  .  ventricular tachycardia-non sustained  02/17/2012    Priority: High  . Cardiomyopathy, ischemic 09/23/2009    Priority: High  . Diabetes mellitus type II, controlled ([place]) 10/16/2006    Priority: High  . CAD (coronary artery disease) 10/16/2006    Priority: High  . Chronic pulmonary embolism ([place]) 10/16/2006    Priority: High  . OSA (obstructive sleep apnea) 02/19/2016    Priority: Medium  . Carotid artery stenosis 03/17/2015    Priority: Medium  . Implantable cardioverter-defibrillator (ICD) in situ 02/17/2012    Priority: Medium  . History of skin cancer 07/05/2007    Priority: Medium  . Hyperlipidemia 10/16/2006    Priority: Medium  . Essential hypertension 10/16/2006    Priority: Medium  . LBBB (left bundle branch block) 08/06/2015    Priority: Low  . Actinic keratosis 11/15/2014    Priority: Low  . Benign neoplasm of colon 12/11/2012    Priority: Low  . Frontal headache 10/06/2017  . Rhinitis 08/22/2017  . Dyspnea on exertion 08/22/2017    Medications- reviewed and updated   Objective: BP 100/66 (BP Location: Left Arm, Patient Position: Sitting, Cuff Size: Large)   Pulse 89   Temp (!) 97.5 F (36.4 C) (Oral)   Ht 6' (1.829 m)   Wt 220 lb 3.2 oz (99.9 kg)   SpO2 92%   BMI 29.86 kg/m  Gen: NAD, resting comfortably  Assessment/Plan:  Diabetes mellitus type II, controlled ([place]) S: muchimproved controlled. On metformin  1g BID, amaryl 4mg . We had tried jardiance addition but was not affordable- in donut hole.  CBGs- usually around 130 but up to 180 or so. No lo wblood sugars.  Exercise and diet- weight stable today but had lost 5 lbs previously Lab Results  Component Value Date   HGBA1C 7.0 10/06/2017   HGBA1C 8.6 (H) 06/27/2017   HGBA1C 7.3 02/24/2017   A/P: improved control- continue current medicines. Could consider amaryl 8mg  as would be more affordable  Hyperlipidemia S: well controlled on atorvastatin 80mg  with last LDL at 73 (though under 70 even more ideal). No myalgias.   A/P: continue max dose statin   Essential hypertension S: controlled on coreg 6.25mg  BID, lasix 20mg  once a week, entresto 49-51mg .  BP Readings from Last 3 Encounters:  10/06/17 100/66  09/14/17 124/66  08/22/17 (!) 102/56  A/P: We discussed blood pressure goal of <140/90. He is below goal but needs above meds for heart failure so will continue  Frontal headache S:  Patient with headaches now for over 4-5 months- typically frontal. Prior associated with some sinus congestion. He was referred to ENT Dr. Lucia Gaskins who saw no congestion. Had CT sinuses with Dr. Lucia Gaskins and was told they were clear.Every morning has eye discomfort- eye doctor says everything is ok.  Can last all day at times. 2/10 pain right now- cough, sneeze bend over gets temple pain that worsens.  Has also  seen optho who has told him not associated with his eyes/vision.  A/P: we discussed potential imaging with MRI brain but he has pacemaker-defibrillator- with workup not showing sinus issues- suspect this is more of a recurrent frontal headache. He asks about neurology referral and this is reasonable- refer today  Future Appointments Date Time Provider [place]  10/18/2017 8:20 AM Sueanne Margarita, MD [facility]  02/03/2018 9:00 AM Yong Channel Brayton Mars, MD [facility] None   4 month follow up  Orders Placed This Encounter  Procedures  .  Ambulatory referral to Neurology    Referral Priority:   Routine    Referral Type:   Consultation    Referral Reason:   Specialty Services Required    Requested Specialty:   Neurology    Number of Visits Requested:   1  . POCT glycosylated hemoglobin (Hb A1C)   Return precautions advised.  [name], MD

## 2017-10-07 ENCOUNTER — Encounter: Payer: Self-pay | Admitting: Cardiology

## 2017-10-07 NOTE — Progress Notes (Signed)
Remote ICD transmission.   

## 2017-10-07 NOTE — Progress Notes (Signed)
Letter  

## 2017-10-07 NOTE — Telephone Encounter (Signed)
Referral will be changed to [facility], no need for a new order.

## 2017-10-07 NOTE — Telephone Encounter (Signed)
Noted. Thanks.

## 2017-10-11 ENCOUNTER — Encounter: Payer: Self-pay | Admitting: Neurology

## 2017-10-13 ENCOUNTER — Encounter: Payer: Self-pay | Admitting: Cardiology

## 2017-10-18 ENCOUNTER — Encounter: Payer: Self-pay | Admitting: Cardiology

## 2017-10-18 ENCOUNTER — Telehealth (HOSPITAL_COMMUNITY): Payer: Self-pay

## 2017-10-18 ENCOUNTER — Ambulatory Visit: Payer: Medicare HMO | Admitting: Cardiology

## 2017-10-18 VITALS — BP 112/64 | HR 86 | Ht 72.0 in | Wt 208.2 lb

## 2017-10-18 DIAGNOSIS — I10 Essential (primary) hypertension: Secondary | ICD-10-CM

## 2017-10-18 DIAGNOSIS — G4733 Obstructive sleep apnea (adult) (pediatric): Secondary | ICD-10-CM

## 2017-10-18 NOTE — Telephone Encounter (Signed)
Medication Samples have been provided to the patient.  Drug name: Lennette Bihari       Strength: 20 mg        Qty: 3  LOT: [id number]  Exp.Date: 3/21  Dosing instructions: Take 1 tab every evening with supper.   The patient has been instructed regarding the correct time, dose, and frequency of taking this medication, including desired effects and most common side effects.   Shirley Muscat 9:21 AM 10/18/2017

## 2017-10-18 NOTE — Patient Instructions (Signed)

## 2017-10-18 NOTE — Progress Notes (Signed)
Cardiology Office Note:    Date:  10/18/2017   ID:  JENKINS RISDON, DOB January 03, 1941, MRN [id number]  PCP:  Marin Olp, MD  Cardiologist:  Fransico Him, MD   Referring MD: Marin Olp, MD   CC:  OSA and HTN   History of Present Illness:    Carl Wood is a 76 y.o. male with a hx of  moderate to severe OSA with an AHI of 25.2/hr and is on CPAP at 8cm H2O.  He is doing well with his CPAP device and thinks that he has gotten used to it.  He tolerates the nasal mask and feels the pressure is adequate.  He sleeps well at night but gets up to go to the bathroom several times at night and so he still feels sleepy in the am and does have some daytime sleepiness.and naps during the day.  He denies any significant mouth or nasal dryness or nasal congestion.  He does not think that he snores.     Past Medical History:  Diagnosis Date  . CAD (coronary artery disease)   . CHF (congestive heart failure) ([place])   . Chronic atrial fibrillation ([place])   . Colon polyps   . Diabetes mellitus   . Diverticulosis of colon   . DIVERTICULOSIS, COLON 10/16/2006   Qualifier: Diagnosis of  By: Leanne Chang MD, [place]    . Excessive daytime sleepiness 02/19/2016  . Hyperlipidemia   . Hypertension   . MI (mitral incompetence)   . Nephrolithiasis   . NEPHROLITHIASIS 06/26/2008   Qualifier: Diagnosis of  By: Sherlynn Stalls, CMA, [place]    . OSA (obstructive sleep apnea) 02/19/2016   Moderate to severe OSA with an AHI of 25/hr and on CPAP at 8cm H2O  . PE (pulmonary embolism)   . V-tach [facility])     Past Surgical History:  Procedure Laterality Date  . BACK SURGERY    . BASAL CELL CARCINOMA EXCISION     nose  . CARDIAC DEFIBRILLATOR PLACEMENT     medtronic virtuoso  . CHOLECYSTECTOMY    . KNEE ARTHROPLASTY      Current Medications: Current Meds  Medication Sig  . ACCU-CHEK AVIVA PLUS test strip TEST BLOOD SUGAR  DAILY  . atorvastatin (LIPITOR) 80 MG tablet TAKE ONE TABLET BY MOUTH ONCE DAILY AS  DIRECTED  . budesonide-formoterol (SYMBICORT) 80-4.5 MCG/ACT inhaler Inhale 2 puffs into the lungs 2 (two) times daily.  . carvedilol (COREG) 12.5 MG tablet Take 0.5 tablets (6.25 mg total) by mouth 2 (two) times daily with a meal.  . digoxin (LANOXIN) 0.25 MG tablet TAKE ONE-HALF TABLET BY MOUTH ONCE DAILY  . furosemide (LASIX) 20 MG tablet Take 1 tablet (20 mg total) by mouth once a week.  Marland Kitchen glimepiride (AMARYL) 4 MG tablet TAKE ONE TABLET BY MOUTH ONCE DAILY BEFORE BREAKFAST  . metFORMIN (GLUCOPHAGE) 1000 MG tablet Take 1 tablet (1,000 mg total) by mouth 2 (two) times daily with a meal.  . rivaroxaban (XARELTO) 20 MG TABS tablet Take 1 tablet (20 mg total) by mouth daily with supper.  . sacubitril-valsartan (ENTRESTO) 49-51 MG Take 1 tablet by mouth 2 (two) times daily.     Allergies:   Penicillins and Sulfamethoxazole   Social History   Socioeconomic History  . Marital status: Married    Spouse name: [name]  . Number of children: 3  . Years of education: 23  . Highest education level: None  Social Needs  . Financial resource strain: None  .  Food insecurity - worry: None  . Food insecurity - inability: None  . Transportation needs - medical: None  . Transportation needs - non-medical: None  Occupational History  . Occupation: Retired    Fish farm manager: RETIRED  Tobacco Use  . Smoking status: Former Smoker    Packs/day: 1.00    Years: 50.00    Pack years: 50.00    Types: Cigarettes    Last attempt to quit: 12/15/2012    Years since quitting: 4.8  . Smokeless tobacco: Never Used  Substance and Sexual Activity  . Alcohol use: No    Alcohol/week: 0.0 oz  . Drug use: No  . Sexual activity: Yes    Birth control/protection: None  Other Topics Concern  . None  Social History Narrative   Married. 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  ROS:   Please see the history of present illness.    ROS  All other systems reviewed and negative.   EKGs/Labs/Other Studies Reviewed:    The following studies were reviewed today: CPAP download  EKG:  EKG is not ordered today.    Recent Labs: 06/27/2017: ALT 17; Hemoglobin 14.8; Platelets 161.0 09/14/2017: BUN 18; Creatinine, Ser 0.91; Potassium 4.8; Sodium 138   Recent Lipid Panel    Component Value Date/Time   CHOL 112 03/18/2016 1024   TRIG 66.0 03/18/2016 1024   TRIG 53 10/10/2006 1113   HDL 29.30 (L) 03/18/2016 1024   CHOLHDL 4 03/18/2016 1024   VLDL 13.2 03/18/2016 1024   LDLCALC 69 03/18/2016 1024   LDLDIRECT 73.0 11/18/2016 1122    Physical Exam:    VS:  Ht 6' (1.829 m)   Wt 208 lb 3.2 oz (94.4 kg)   BMI 28.24 kg/m     Wt Readings from Last 3 Encounters:  10/18/17 208 lb 3.2 oz (94.4 kg)  10/06/17 220 lb 3.2 oz (99.9 kg)  09/14/17 219 lb 8 oz (99.6 kg)     GEN:  Well nourished, well developed in no acute distress HEENT: Normal NECK: No JVD; No carotid bruits LYMPHATICS: No lymphadenopathy CARDIAC: RRR, no murmurs, rubs, gallops RESPIRATORY:  Clear to auscultation without rales, wheezing or rhonchi  ABDOMEN: Soft, non-tender, non-distended MUSCULOSKELETAL:  No edema; No deformity  SKIN: Warm and dry NEUROLOGIC:  Alert and oriented x 3 PSYCHIATRIC:  Normal affect   ASSESSMENT:    1. OSA (obstructive sleep apnea)   2. Essential hypertension    PLAN:    In order of problems listed above:  1.  OSA - the patient is tolerating PAP therapy well without any problems. The PAP download was reviewed today and showed an AHI of 1.5/hr on 8 cm H2O with 100% compliance in using more than 4 hours nightly.  The patient has been  using and benefiting from CPAP use and will continue to benefit from therapy.   2.  HTN - BP is well controlled on exam today.  He will continue on carvedilol 6.25mg  BID, Entresto 49-51mg  BID.     Medication Adjustments/Labs and Tests Ordered: Current medicines are reviewed at length with the patient today.  Concerns regarding medicines are outlined above.  No orders of the defined types were placed in this encounter.  No orders of the defined types were placed in this encounter.   Signed, Fransico Him, MD  10/18/2017 8:21 AM    [place]

## 2017-10-26 ENCOUNTER — Ambulatory Visit: Payer: Medicare HMO | Admitting: Neurology

## 2017-10-26 ENCOUNTER — Encounter: Payer: Self-pay | Admitting: Neurology

## 2017-10-26 ENCOUNTER — Other Ambulatory Visit: Payer: Medicare HMO

## 2017-10-26 VITALS — BP 100/62 | HR 68 | Ht 72.0 in | Wt 222.4 lb

## 2017-10-26 DIAGNOSIS — R51 Headache: Principal | ICD-10-CM

## 2017-10-26 DIAGNOSIS — R519 Headache, unspecified: Secondary | ICD-10-CM

## 2017-10-26 DIAGNOSIS — G4483 Primary cough headache: Secondary | ICD-10-CM

## 2017-10-26 NOTE — Patient Instructions (Signed)
1.  We will check CT and CTA of head 2.  We will check sed rate 3.  If you wish to treat it with medication, contact me.

## 2017-10-26 NOTE — Progress Notes (Addendum)
NEUROLOGY CONSULTATION NOTE  LYNETTE NOAH MRN: [id number] DOB: 12-08-1940  Referring provider: Dr. Yong Channel Primary care provider: Dr. Yong Channel  Reason for consult:  headache  HISTORY OF PRESENT ILLNESS: Carl Wood is a 76 year old right-handed male with chronic atrial fibrillation with PPM on anticoagulation, ischemic cardiomyopathy, chronic systolic heart failure, CAD, type 2 diabetes mellitus, hypertension, hyperlipidemia, OSA, former smoker and history of embolic stroke who presents for headache.  He is accompanied by his wife who supplements history.  In June, he started experiencing a new onset headache.  It occurs only when he coughs, sneezes or bends over.  It is a severe pressure-like pain behind his eyes and temples.  It lasts for a few seconds.  There is no associated nausea, vomiting, dizziness, photophobia, phonophobia or visual disturbance.  It occurs almost daily. Nothing specifically relieves it as it resolves in seconds.  He does not take pain relievers for it.  It has been unchanged.   Initially, he noted pressure behind the eyes and was noted to have effusion in the ears.  He was referred to ENT for sinusitis.  CT maxillofacial sinuses from 06/13/17 were unremarkable.  Formal eye exam was reportedly unremarkable.  He denies any preceding event such as head trauma or change in medication.  He has OSA but is compliant with his CPAP.  He drinks caffeinated soda once in a while.  He is not using decongestants.  He has no personal history of headache.  09/14/17 BMP:  BUN 18, Cr 0.91  PAST MEDICAL HISTORY: Past Medical History:  Diagnosis Date  . CAD (coronary artery disease)   . CHF (congestive heart failure) ([place])   . Chronic atrial fibrillation ([place])   . Colon polyps   . Diabetes mellitus   . Diverticulosis of colon   . DIVERTICULOSIS, COLON 10/16/2006   Qualifier: Diagnosis of  By: Leanne Chang MD, [place]    . Excessive daytime sleepiness 02/19/2016  .  Hyperlipidemia   . Hypertension   . MI (mitral incompetence)   . Nephrolithiasis   . NEPHROLITHIASIS 06/26/2008   Qualifier: Diagnosis of  By: Sherlynn Stalls, CMA, [place]    . OSA (obstructive sleep apnea) 02/19/2016   Moderate to severe OSA with an AHI of 25/hr and on CPAP at 8cm H2O  . PE (pulmonary embolism)   . V-tach [facility])     PAST SURGICAL HISTORY: Past Surgical History:  Procedure Laterality Date  . BACK SURGERY    . BASAL CELL CARCINOMA EXCISION     nose  . BIV UPGRADE N/A 03/28/2017   Procedure: BiV Upgrade;  Surgeon: Deboraha Sprang, MD;  Location: [facility];  Service: Cardiovascular;  Laterality: N/A;  . CARDIAC CATHETERIZATION N/A 01/20/2016   Procedure: Right/Left Heart Cath and Coronary Angiography;  Surgeon: Jolaine Artist, MD;  Location: [facility];  Service: Cardiovascular;  Laterality: N/A;  . CARDIAC DEFIBRILLATOR PLACEMENT     medtronic virtuoso  . CHOLECYSTECTOMY    . COLONOSCOPY  12/11/2012   Procedure: COLONOSCOPY;  Surgeon: Inda Castle, MD;  Location: [facility];  Service: Endoscopy;  Laterality: N/A;  . KNEE ARTHROPLASTY      MEDICATIONS: Current Outpatient Medications on File Prior to Visit  Medication Sig Dispense Refill  . ACCU-CHEK AVIVA PLUS test strip TEST BLOOD SUGAR  DAILY 100 each 12  . atorvastatin (LIPITOR) 80 MG tablet TAKE ONE TABLET BY MOUTH ONCE DAILY AS DIRECTED 30 tablet 5  . budesonide-formoterol (SYMBICORT) 80-4.5 MCG/ACT  inhaler Inhale 2 puffs into the lungs 2 (two) times daily. 1 Inhaler 0  . carvedilol (COREG) 12.5 MG tablet Take 0.5 tablets (6.25 mg total) by mouth 2 (two) times daily with a meal. 60 tablet 6  . digoxin (LANOXIN) 0.25 MG tablet TAKE ONE-HALF TABLET BY MOUTH ONCE DAILY 45 tablet 3  . furosemide (LASIX) 20 MG tablet Take 1 tablet (20 mg total) by mouth once a week. 15 tablet 3  . glimepiride (AMARYL) 4 MG tablet TAKE ONE TABLET BY MOUTH ONCE DAILY BEFORE BREAKFAST 90 tablet 1  . metFORMIN (GLUCOPHAGE)  1000 MG tablet Take 1 tablet (1,000 mg total) by mouth 2 (two) times daily with a meal. 180 tablet 3  . rivaroxaban (XARELTO) 20 MG TABS tablet Take 1 tablet (20 mg total) by mouth daily with supper. 21 tablet 0  . sacubitril-valsartan (ENTRESTO) 49-51 MG Take 1 tablet by mouth 2 (two) times daily. 60 tablet 6   No current facility-administered medications on file prior to visit.     ALLERGIES: Allergies  Allergen Reactions  . Penicillins Other (See Comments)    Patient passed out  . Sulfamethoxazole Rash    FAMILY HISTORY: Family History  Problem Relation Age of Onset  . Colon cancer Father   . Diabetes Father   . Heart disease Father   . Bladder Cancer Mother   . Heart disease Mother     SOCIAL HISTORY: Social History   Socioeconomic History  . Marital status: Married    Spouse name: [name]  . Number of children: 3  . Years of education: 56  . Highest education level: Not on file  Social Needs  . Financial resource strain: Not on file  . Food insecurity - worry: Not on file  . Food insecurity - inability: Not on file  . Transportation needs - medical: Not on file  . Transportation needs - non-medical: Not on file  Occupational History  . Occupation: Retired    Fish farm manager: RETIRED  Tobacco Use  . Smoking status: Former Smoker    Packs/day: 1.00    Years: 50.00    Pack years: 50.00    Types: Cigarettes    Last attempt to quit: 12/15/2012    Years since quitting: 4.8  . Smokeless tobacco: Never Used  Substance and Sexual Activity  . Alcohol use: No    Alcohol/week: 0.0 oz  . Drug use: No  . Sexual activity: Yes    Birth control/protection: None  Other Topics Concern  . Not on file  Social History Narrative   Married. 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      Hobbies: woodwork, fishing, some shooting     [place]: Constitutional: No fevers, chills, or sweats, no generalized fatigue, change in appetite Eyes: No visual changes, double vision, eye pain Ear, nose and throat: No hearing loss, ear pain, nasal congestion, sore throat Cardiovascular: No chest pain, palpitations Respiratory:  No shortness of breath at rest or with exertion, wheezes GastrointestinaI: No nausea, vomiting, diarrhea, abdominal pain, fecal incontinence Genitourinary:  No dysuria, urinary retention or frequency Musculoskeletal:  No neck pain, back pain Integumentary: No rash, pruritus, skin lesions Neurological: as above Psychiatric: No depression, insomnia, anxiety Endocrine: No palpitations, fatigue, diaphoresis, mood swings, change in appetite, change in weight, increased thirst Hematologic/Lymphatic:  No purpura, petechiae. Allergic/Immunologic: no itchy/runny eyes,  nasal congestion, recent allergic reactions, rashes  PHYSICAL EXAM: Vitals:   10/26/17 0753  BP: 100/62  Pulse: 68  SpO2: 93%   General: No acute distress.  Patient appears well-groomed.  Head:  Normocephalic/atraumatic Eyes:  fundi examined but not visualized Neck: supple, no paraspinal tenderness, full range of motion Back: No paraspinal tenderness Heart: regular rate and rhythm Lungs: wheezes Vascular: No carotid bruits. Neurological Exam: Mental status: alert and oriented to person, place, and time, recent and remote memory intact, fund of knowledge intact, attention and concentration intact, speech fluent and not dysarthric, language intact. Cranial nerves: CN I: not tested CN II: pupils equal, round and reactive to light, visual fields intact CN III, IV, VI:  full range of motion, no nystagmus, no ptosis CN V: facial sensation intact CN VII: upper and lower face symmetric CN VIII: hearing intact CN IX, X: gag intact, uvula midline CN XI: sternocleidomastoid and trapezius muscles intact CN XII: tongue midline Bulk &  Tone: normal, no fasciculations. Motor:  5/5 throughout  Sensation:  Pinprick and vibration sensation intact. Deep Tendon Reflexes:  2+ throughout, toes downgoing.  Finger to nose testing:  Without dysmetria.  Heel to shin:  Without dysmetria.  Gait:  Normal station and stride.  Able to turnRomberg negative.  IMPRESSION: Cough headache  PLAN: 1.  Check CT/CTA of head to evaluate for secondary etiology, such as intracranial mass, Chiari, or aneurysm (cannot get MRI due to PPM).  We will also check sed rate.  Further recommendations pending results. 2.  Suggested treating headache with medication such as topiramate, but patient declines at this time.  He will contact me if he changes his mind.  Thank you for allowing me to take part in the care of this patient.  Metta Clines, DO  CC: [name], MD

## 2017-10-27 LAB — TIQ-NTM

## 2017-10-27 LAB — SEDIMENTATION RATE: SED RATE: 11 mm/h (ref 0–20)

## 2017-11-02 ENCOUNTER — Telehealth: Payer: Self-pay

## 2017-11-02 LAB — CUP PACEART REMOTE DEVICE CHECK
Battery Voltage: 2.98 V
Brady Statistic AP VP Percent: 0 %
Brady Statistic AP VS Percent: 0 %
Brady Statistic AS VP Percent: 0 %
Brady Statistic RA Percent Paced: 0 %
HIGH POWER IMPEDANCE MEASURED VALUE: 76 Ohm
Implantable Lead Implant Date: 20180423
Implantable Lead Location: 753860
Lead Channel Impedance Value: 232.653
Lead Channel Impedance Value: 232.653
Lead Channel Impedance Value: 237.5 Ohm
Lead Channel Impedance Value: 245.538
Lead Channel Impedance Value: 342 Ohm
Lead Channel Impedance Value: 418 Ohm
Lead Channel Impedance Value: 456 Ohm
Lead Channel Impedance Value: 475 Ohm
Lead Channel Impedance Value: 475 Ohm
Lead Channel Impedance Value: 817 Ohm
Lead Channel Impedance Value: 836 Ohm
Lead Channel Impedance Value: 836 Ohm
Lead Channel Impedance Value: 836 Ohm
Lead Channel Pacing Threshold Amplitude: 1.5 V
Lead Channel Pacing Threshold Pulse Width: 1 ms
Lead Channel Sensing Intrinsic Amplitude: 10.625 mV
Lead Channel Sensing Intrinsic Amplitude: 10.625 mV
Lead Channel Setting Sensing Sensitivity: 0.3 mV
MDC IDC LEAD IMPLANT DT: 20010723
MDC IDC LEAD LOCATION: 753858
MDC IDC MSMT BATTERY REMAINING LONGEVITY: 63 mo
MDC IDC MSMT LEADCHNL LV IMPEDANCE VALUE: 250.943
MDC IDC MSMT LEADCHNL LV IMPEDANCE VALUE: 532 Ohm
MDC IDC MSMT LEADCHNL LV IMPEDANCE VALUE: 836 Ohm
MDC IDC MSMT LEADCHNL LV IMPEDANCE VALUE: 893 Ohm
MDC IDC MSMT LEADCHNL LV PACING THRESHOLD AMPLITUDE: 1.875 V
MDC IDC MSMT LEADCHNL RA IMPEDANCE VALUE: 4047 Ohm
MDC IDC MSMT LEADCHNL RV PACING THRESHOLD PULSEWIDTH: 0.4 ms
MDC IDC PG IMPLANT DT: 20180423
MDC IDC SESS DTM: 20181101183716
MDC IDC SET LEADCHNL LV PACING AMPLITUDE: 2.75 V
MDC IDC SET LEADCHNL LV PACING PULSEWIDTH: 1 ms
MDC IDC SET LEADCHNL RV PACING AMPLITUDE: 3 V
MDC IDC SET LEADCHNL RV PACING PULSEWIDTH: 0.4 ms
MDC IDC STAT BRADY AS VS PERCENT: 0 %
MDC IDC STAT BRADY RV PERCENT PACED: 90.07 %

## 2017-11-02 NOTE — Telephone Encounter (Signed)
-----   Message from Pieter Partridge, DO sent at 10/30/2017  8:36 PM EST ----- Lab is normal

## 2017-11-02 NOTE — Telephone Encounter (Signed)
Called and spoke with wife, advsd her of lab results. She wanted the ph# to [place], provided that for her.

## 2017-11-05 ENCOUNTER — Other Ambulatory Visit (HOSPITAL_COMMUNITY): Payer: Self-pay | Admitting: Internal Medicine

## 2017-11-15 ENCOUNTER — Ambulatory Visit
Admission: RE | Admit: 2017-11-15 | Discharge: 2017-11-15 | Disposition: A | Discharge status: Home / Self Care | Payer: Medicare HMO | Source: Ambulatory Visit | Attending: Neurology | Admitting: Neurology

## 2017-11-15 DIAGNOSIS — R51 Headache: Secondary | ICD-10-CM

## 2017-11-15 DIAGNOSIS — R519 Headache, unspecified: Secondary | ICD-10-CM

## 2017-11-15 DIAGNOSIS — G4483 Primary cough headache: Secondary | ICD-10-CM

## 2017-11-15 MED ORDER — IOPAMIDOL (ISOVUE-370) INJECTION 76%
75.0000 mL | Freq: Once | INTRAVENOUS | Status: AC | PRN
  Administered 2017-11-15: 75 mL via INTRAVENOUS

## 2017-11-16 ENCOUNTER — Telehealth: Payer: Self-pay | Admitting: Neurology

## 2017-11-16 ENCOUNTER — Telehealth: Payer: Self-pay

## 2017-11-16 NOTE — Telephone Encounter (Signed)
Rcvd call from Pt's daughter, Cathie Beams. She was concerned about Pts imaging study. Advsd have not seen results as of yet

## 2017-11-16 NOTE — Telephone Encounter (Signed)
I called and spoke with Carl Wood about the CT findings of small subdural hematomas.  They are chronic but were not seen on previous imaging in July.  He states that he has not had any falls or head injury since July.  I spoke with his cardiologist, Dr. Haroldine Laws.  He agrees that we should stop the Xarelto for now.  While the subdurals are small and chronic, they are new since just this past July.  I explained to Carl Wood that there is a small risk of ischemic stroke by stopping the Xarelto.  However, since we don't know the cause of the bleed, I think that the risk of recurrent bleeding would outweigh the risk of stroke at this time.  Plan would be to repeat head CT in 2 months to evaluate for resolution.  I will have him follow up with me as well.  I answered all questions to Carl Wood's satisfaction.

## 2017-11-18 ENCOUNTER — Telehealth: Payer: Self-pay

## 2017-11-18 DIAGNOSIS — I639 Cerebral infarction, unspecified: Secondary | ICD-10-CM

## 2017-11-18 NOTE — Telephone Encounter (Signed)
-----   Message from Pieter Partridge, DO sent at 11/18/2017  7:16 AM EST ----- [place]  I would like to repeat head CT in 2 months and have him follow up soon afterward.  Thank you.

## 2017-11-18 NOTE — Telephone Encounter (Signed)
Called and spoke with Pts wife, advsd her of repeat CT in 2 months and f/u here after, trx to front office for appointment latter part of Feb.

## 2017-11-21 ENCOUNTER — Ambulatory Visit: Payer: Medicare HMO | Admitting: Family Medicine

## 2017-11-21 ENCOUNTER — Telehealth: Payer: Self-pay | Admitting: Family Medicine

## 2017-11-21 ENCOUNTER — Encounter: Payer: Self-pay | Admitting: Family Medicine

## 2017-11-21 DIAGNOSIS — Z8679 Personal history of other diseases of the circulatory system: Secondary | ICD-10-CM | POA: Insufficient documentation

## 2017-11-21 DIAGNOSIS — I6203 Nontraumatic chronic subdural hemorrhage: Secondary | ICD-10-CM

## 2017-11-21 NOTE — Assessment & Plan Note (Addendum)
Reviewed notes from Dr. Tomi Likens who had communicated with Dr. Haroldine Laws after subdural hematomas (new since July scan) were discovered on CT angiogram. Patient very nervous about stroke so wanted to sit down with a provider before stopping (couldn't get in with Dr. Tomi Likens until February after his follow up 2 month scan). He has continued xarelto as a result.   I had a long conversation with him and his wife. We discussed elevated stroke risk and chadsvasc score of 7  (Risk per year of stroke or TIA around 15%)balanced with these potentially new brain bleeds- I concur with Dr. Haroldine Laws and Dr. Tomi Likens- in the end after discussion he agreed to stop xarelto and follow up with Dr. Tomi Likens. He still is having headaches but is not ready for medicine yet- will discuss with Dr. Tomi Likens at follow up.   Patient asks me about starting aspirin. I told him I would prefer for him to have that discussion with Dr. Tomi Likens after the next scan- he agrees.

## 2017-11-21 NOTE — Telephone Encounter (Signed)
MEDICATION:  budesonide-formoterol (SYMBICORT) 80-4.5 MCG/ACT inhaler  PHARMACY:   [facility], [place] [phone] (Phone) [phone] (Fax)    IS THIS A 90 DAY SUPPLY : Y  IS PATIENT OUT OF MEDICATION:   IF NOT; HOW MUCH IS LEFT:   LAST APPOINTMENT DATE: @12 /17/2018  NEXT APPOINTMENT DATE:@3 /12/2017  OTHER COMMENTS:    **Let patient know to contact pharmacy at the end of the day to make sure medication is ready. **  ** Please notify patient to allow 48-72 hours to process**  **Encourage patient to contact the pharmacy for refills or they can request refills through [facility]**

## 2017-11-21 NOTE — Progress Notes (Signed)
Subjective:  Carl Wood is a 76 y.o. year old very pleasant male patient who presents for/with See problem oriented charting ROS- continued frontal headache, no chest pain   Past Medical History-  Patient Active Problem List   Diagnosis Date Noted  . Chronic atrial fibrillation (HCC)     Priority: High  . Chronic systolic heart failure ([place]) 08/06/2015    Priority: High  . Former smoker 11/15/2014    Priority: High  . History of cardioembolic cerebrovascular accident (CVA) 12/19/2012    Priority: High  .  ventricular tachycardia-non sustained  02/17/2012    Priority: High  . Cardiomyopathy, ischemic 09/23/2009    Priority: High  . Diabetes mellitus type II, controlled ([place]) 10/16/2006    Priority: High  . CAD (coronary artery disease) 10/16/2006    Priority: High  . Chronic pulmonary embolism ([place]) 10/16/2006    Priority: High  . OSA (obstructive sleep apnea) 02/19/2016    Priority: Medium  . Carotid artery stenosis 03/17/2015    Priority: Medium  . Implantable cardioverter-defibrillator (ICD) in situ 02/17/2012    Priority: Medium  . History of skin cancer 07/05/2007    Priority: Medium  . Hyperlipidemia 10/16/2006    Priority: Medium  . Essential hypertension 10/16/2006    Priority: Medium  . LBBB (left bundle branch block) 08/06/2015    Priority: Low  . Actinic keratosis 11/15/2014    Priority: Low  . Benign neoplasm of colon 12/11/2012    Priority: Low  . Chronic subdural hematoma ([place]) 11/21/2017  . Frontal headache 10/06/2017  . Rhinitis 08/22/2017  . Dyspnea on exertion 08/22/2017    Medications- reviewed and updated Current Outpatient Medications  Medication Sig Dispense Refill  . ACCU-CHEK AVIVA PLUS test strip TEST BLOOD SUGAR  DAILY 100 each 12  . atorvastatin (LIPITOR) 80 MG tablet TAKE ONE TABLET BY MOUTH ONCE DAILY AS DIRECTED 30 tablet 5  . budesonide-formoterol (SYMBICORT) 80-4.5 MCG/ACT inhaler Inhale 2 puffs into the lungs 2 (two) times  daily. 1 Inhaler 0  . carvedilol (COREG) 12.5 MG tablet Take 0.5 tablets (6.25 mg total) by mouth 2 (two) times daily with a meal. 60 tablet 6  . digoxin (LANOXIN) 0.25 MG tablet TAKE ONE-HALF TABLET BY MOUTH ONCE DAILY 45 tablet 3  . furosemide (LASIX) 20 MG tablet Take 1 tablet (20 mg total) by mouth once a week. 15 tablet 3  . furosemide (LASIX) 20 MG tablet TAKE ONE TABLET BY MOUTH ONCE DAILY AS NEEDED FOR  WEIGHT  GAIN  OF  3  POUNDS  IN  24  HOURS 30 tablet 3  . glimepiride (AMARYL) 4 MG tablet TAKE ONE TABLET BY MOUTH ONCE DAILY BEFORE BREAKFAST 90 tablet 1  . metFORMIN (GLUCOPHAGE) 1000 MG tablet Take 1 tablet (1,000 mg total) by mouth 2 (two) times daily with a meal. 180 tablet 3  . sacubitril-valsartan (ENTRESTO) 49-51 MG Take 1 tablet by mouth 2 (two) times daily. 60 tablet 6   No current facility-administered medications for this visit.     Objective: BP (!) 92/58 (BP Location: Left Arm, Patient Position: Sitting, Cuff Size: Large)   Pulse 66   Temp (!) 97.3 F (36.3 C) (Oral)   Ht 6' (1.829 m)   Wt 224 lb 9.6 oz (101.9 kg)   SpO2 95%   BMI 30.46 kg/m  Gen: NAD, resting comfortably  Assessment/Plan:  Chronic subdural hematoma (HCC) Reviewed notes from Dr. Tomi Likens who had communicated with Dr. Haroldine Laws after subdural hematomas (new  since July scan) were discovered on CT angiogram. Patient very nervous about stroke so wanted to sit down with a provider before stopping (couldn't get in with Dr. Tomi Likens until February after his follow up 2 month scan). He has continued xarelto as a result.   I had a long conversation with him and his wife. We discussed elevated stroke risk and chadsvasc score of 7  (Risk per year of stroke or TIA around 15%)balanced with these potentially new brain bleeds- I concur with Dr. Haroldine Laws and Dr. Tomi Likens- in the end after discussion he agreed to stop xarelto and follow up with Dr. Tomi Likens. He still is having headaches but is not ready for medicine yet- will  discuss with Dr. Tomi Likens at follow up.   Patient asks me about starting aspirin. I told him I would prefer for him to have that discussion with Dr. Tomi Likens after the next scan- he agrees.    Future Appointments  Date Time Provider [place]  01/02/2018 10:15 AM Deboraha Sprang, MD [facility]  01/05/2018 11:05 AM [facility] DEVICE REMOTES [facility]  01/27/2018 10:30 AM Pieter Partridge, DO [facility] None  02/03/2018  9:00 AM Marin Olp, MD [facility]   The duration of face-to-face time during this visit was greater than 15 minutes. Greater than 50% of this time was spent in counseling, explanation of diagnosis, planning of further management, and/or coordination of care including discussion about coming off xarelto including benefits/risks.    Return precautions advised.  [name], MD

## 2017-11-23 ENCOUNTER — Other Ambulatory Visit: Payer: Self-pay

## 2017-11-23 MED ORDER — BUDESONIDE-FORMOTEROL FUMARATE 80-4.5 MCG/ACT IN AERO: 2.0000 | INHALATION_SPRAY | Freq: Two times a day (BID) | RESPIRATORY_TRACT | 2 refills | Status: DC

## 2017-11-23 NOTE — Telephone Encounter (Signed)
Refill sent to pharmacy.   

## 2017-12-05 ENCOUNTER — Encounter: Payer: Self-pay | Admitting: *Deleted

## 2017-12-27 ENCOUNTER — Telehealth: Payer: Self-pay

## 2017-12-27 NOTE — Telephone Encounter (Signed)
**Note De-identified Jaiah Weigel Obfuscation** I have done a Digoxin PA through covermymeds. 

## 2017-12-28 NOTE — Telephone Encounter (Signed)
Approval received via fax on the Digoxin PA from [facility]. Approval good until 12/27/2019.  I have notified [employer].

## 2018-01-02 ENCOUNTER — Encounter: Payer: Self-pay | Admitting: Internal Medicine

## 2018-01-02 ENCOUNTER — Ambulatory Visit (INDEPENDENT_AMBULATORY_CARE_PROVIDER_SITE_OTHER): Payer: Medicare HMO | Admitting: Internal Medicine

## 2018-01-02 VITALS — BP 110/68 | HR 86 | Ht 72.0 in | Wt 221.0 lb

## 2018-01-02 DIAGNOSIS — I255 Ischemic cardiomyopathy: Secondary | ICD-10-CM

## 2018-01-02 DIAGNOSIS — I482 Chronic atrial fibrillation, unspecified: Secondary | ICD-10-CM

## 2018-01-02 DIAGNOSIS — I472 Ventricular tachycardia, unspecified: Secondary | ICD-10-CM

## 2018-01-02 DIAGNOSIS — Z95810 Presence of automatic (implantable) cardiac defibrillator: Secondary | ICD-10-CM

## 2018-01-02 LAB — CUP PACEART INCLINIC DEVICE CHECK
Battery Voltage: 2.97 V
Brady Statistic AP VP Percent: 0 %
Brady Statistic AP VS Percent: 0 %
Brady Statistic AS VP Percent: 0 %
Brady Statistic AS VS Percent: 0 %
Date Time Interrogation Session: 20190128114527
HighPow Impedance: 77 Ohm
Implantable Lead Implant Date: 20180423
Implantable Lead Location: 753858
Implantable Lead Location: 753860
Lead Channel Impedance Value: 222.34 Ohm
Lead Channel Impedance Value: 222.34 Ohm
Lead Channel Impedance Value: 399 Ohm
Lead Channel Impedance Value: 418 Ohm
Lead Channel Impedance Value: 475 Ohm
Lead Channel Impedance Value: 779 Ohm
Lead Channel Impedance Value: 817 Ohm
Lead Channel Impedance Value: 817 Ohm
Lead Channel Pacing Threshold Amplitude: 2 V
Lead Channel Pacing Threshold Pulse Width: 0.4 ms
Lead Channel Pacing Threshold Pulse Width: 1 ms
Lead Channel Sensing Intrinsic Amplitude: 11.5 mV
Lead Channel Sensing Intrinsic Amplitude: 12.625 mV
Lead Channel Setting Pacing Amplitude: 2.25 V
Lead Channel Setting Pacing Amplitude: 3 V
Lead Channel Setting Pacing Pulse Width: 1 ms
Lead Channel Setting Sensing Sensitivity: 0.3 mV
MDC IDC LEAD IMPLANT DT: 20010723
MDC IDC MSMT BATTERY REMAINING LONGEVITY: 68 mo
MDC IDC MSMT LEADCHNL LV IMPEDANCE VALUE: 230.327
MDC IDC MSMT LEADCHNL LV IMPEDANCE VALUE: 237.5 Ohm
MDC IDC MSMT LEADCHNL LV IMPEDANCE VALUE: 246.635
MDC IDC MSMT LEADCHNL LV IMPEDANCE VALUE: 475 Ohm
MDC IDC MSMT LEADCHNL LV IMPEDANCE VALUE: 513 Ohm
MDC IDC MSMT LEADCHNL LV IMPEDANCE VALUE: 817 Ohm
MDC IDC MSMT LEADCHNL LV IMPEDANCE VALUE: 836 Ohm
MDC IDC MSMT LEADCHNL LV IMPEDANCE VALUE: 874 Ohm
MDC IDC MSMT LEADCHNL RA IMPEDANCE VALUE: 4047 Ohm
MDC IDC MSMT LEADCHNL RV IMPEDANCE VALUE: 285 Ohm
MDC IDC MSMT LEADCHNL RV PACING THRESHOLD AMPLITUDE: 1.5 V
MDC IDC PG IMPLANT DT: 20180423
MDC IDC SET LEADCHNL RV PACING PULSEWIDTH: 0.4 ms
MDC IDC STAT BRADY RA PERCENT PACED: 0 %
MDC IDC STAT BRADY RV PERCENT PACED: 91.41 %

## 2018-01-02 NOTE — Progress Notes (Signed)
Patient Care Team: Marin Olp, MD as PCP - General (Family Medicine)   HPI  Carl Wood is a 76 y.o. male s seen in followup for ischemic heart disease with ICD implanted for ventricular tachycardia. He has history of bypass grafting and depressed left ventricular function. He has a history of recurrent ventricular tachycardia--most recently polymorphic in september 2101 associated with low-normal magnesium.  He underwent a Myoview scanning demonstrated ejection fraction of 27% without ischemia but with a large LAD infarct.   He has permanent atrial fibrillation.   Echocardiogram 1/14 EF 38-35% 06/2015 ECHO EF 20% Severely dilated mild TR,   2/17  Cath. Severe 1v CAD with high grade mid LAD lesion (anterior wall felt to be non-viable so treated medically)    Ischemic CM with EF 25-30% with akinesis of mid anterior wall and aneurysmal deformity of distal anterior wall and apex DATE TEST    7/16 Echo    EF 20 %   2/17 Cath   EF   % LAD disease  Nonviable ant wall   10/17 Echo  EF 20-25%     He underwent CRT upgrade 4/18.--He had an IVCD but anterior precordial notching.    He has had problems with headaches and eye pain.  He underwent a series of evaluations including serial CT scanning report describes n  "small subdural hematomas new from 7/18 chronic in appearance."  He denies chest pain but has progressive fatigue hence sleeps a lot.  He does not have edema.  There is shortness of breath with exertion.  Again, he and his wife both think that maybe he has worse following CRT upgrade.  Thromboembolic risk factors ( age -54 , HTN-1, TIA/CVA-2, DM-1, Vasc disease -1, CHF-1) for a CHADSVASc Score of 7   Past Medical History:  Diagnosis Date  . CAD (coronary artery disease)   . CHF (congestive heart failure) ([place])   . Chronic atrial fibrillation ([place])   . Colon polyps   . Diabetes mellitus   . Diverticulosis of colon   . DIVERTICULOSIS, COLON 10/16/2006   Qualifier: Diagnosis of  By: Leanne Chang MD, [place]    . Excessive daytime sleepiness 02/19/2016  . Hyperlipidemia   . Hypertension   . MI (mitral incompetence)   . Nephrolithiasis   . NEPHROLITHIASIS 06/26/2008   Qualifier: Diagnosis of  By: Sherlynn Stalls, CMA, [place]    . OSA (obstructive sleep apnea) 02/19/2016   Moderate to severe OSA with an AHI of 25/hr and on CPAP at 8cm H2O  . PE (pulmonary embolism)   . V-tach [facility])     Past Surgical History:  Procedure Laterality Date  . BACK SURGERY    . BASAL CELL CARCINOMA EXCISION     nose  . BIV UPGRADE N/A 03/28/2017   Procedure: BiV Upgrade;  Surgeon: Deboraha Sprang, MD;  Location: [facility];  Service: Cardiovascular;  Laterality: N/A;  . CARDIAC CATHETERIZATION N/A 01/20/2016   Procedure: Right/Left Heart Cath and Coronary Angiography;  Surgeon: Jolaine Artist, MD;  Location: [facility];  Service: Cardiovascular;  Laterality: N/A;  . CARDIAC DEFIBRILLATOR PLACEMENT     medtronic virtuoso  . CHOLECYSTECTOMY    . COLONOSCOPY  12/11/2012   Procedure: COLONOSCOPY;  Surgeon: Inda Castle, MD;  Location: [facility];  Service: Endoscopy;  Laterality: N/A;  . KNEE ARTHROPLASTY      Current Outpatient Medications  Medication Sig Dispense Refill  . ACCU-CHEK AVIVA PLUS test  strip TEST BLOOD SUGAR  DAILY 100 each 12  . atorvastatin (LIPITOR) 80 MG tablet TAKE ONE TABLET BY MOUTH ONCE DAILY AS DIRECTED 30 tablet 5  . budesonide-formoterol (SYMBICORT) 80-4.5 MCG/ACT inhaler Inhale 2 puffs into the lungs 2 (two) times daily. 1 Inhaler 2  . carvedilol (COREG) 12.5 MG tablet Take 0.5 tablets (6.25 mg total) by mouth 2 (two) times daily with a meal. 60 tablet 6  . digoxin (LANOXIN) 0.25 MG tablet TAKE ONE-HALF TABLET BY MOUTH ONCE DAILY 45 tablet 3  . furosemide (LASIX) 20 MG tablet TAKE ONE TABLET BY MOUTH ONCE DAILY AS NEEDED FOR  WEIGHT  GAIN  OF  3  POUNDS  IN  24  HOURS 30 tablet 3  . glimepiride (AMARYL) 4 MG tablet TAKE ONE TABLET  BY MOUTH ONCE DAILY BEFORE BREAKFAST 90 tablet 1  . metFORMIN (GLUCOPHAGE) 1000 MG tablet Take 1 tablet (1,000 mg total) by mouth 2 (two) times daily with a meal. 180 tablet 3  . sacubitril-valsartan (ENTRESTO) 49-51 MG Take 1 tablet by mouth 2 (two) times daily. 60 tablet 6   No current facility-administered medications for this visit.     Allergies  Allergen Reactions  . Penicillins Other (See Comments)    Patient passed out  . Sulfamethoxazole Rash    Review of Systems negative except from HPI and PMH  Physical Exam BP 110/68   Pulse 86   Ht 6' (1.829 m)   Wt 221 lb (100.2 kg)   BMI 29.97 kg/m  Well developed and nourished in no acute distress HENT normal Neck supple with JVP-flat Clear Regular rate and rhythm, no murmurs or gallops Abd-soft with active BS No Clubbing cyanosis edema Skin-warm and dry A & Oriented  Grossly normal sensory and motor function   ECG demonstrates atrial fibrillation with ventricular pacing at 86  Morphology QRS in leads 1 and RS in lead V1   assessment and  Plan  Atrial fibrillation-permanent  IVCD  Ischemic cardiomyopathy prior bypass surgery  ICD-Medtronic-CRT   Subdural hematoma  He remains no better following CRT upgrade.  We will undertake an echocardiogram with DD optimization looking at aortic valve envelopes as a surrogate for cardiac output  Given his recent diagnosis of subdural hematomas, we will have to discuss the role of anticoagulation.  A paper from [place] demonstrated a net clinical benefit in patients with a CHADS-VASc score of greater than or equal to 7 using Coumadin.  I would think that that number should be considerably less and Caucasians in people using NOACs.  I will discuss this with Dr. Tomi Likens.  No ischemia.  No evidence of volume overload.  More than 50% of 40 back on Saturday min was spent in counseling related to the above In a knee

## 2018-01-02 NOTE — Patient Instructions (Addendum)
Medication Instructions:  Your physician recommends that you continue on your current medications as directed. Please refer to the Current Medication list given to you today.  Labwork: None ordered.  Testing/Procedures: Your physician has recommended that you have an AV optimization echo. During this procedure, an echocardiogram is performed to optimize the timing of your device using ultrasound and a device programmer. Changes will be made to the device settings to help the heart chambers pump more efficiently. This procedure takes approximately one hour.   Follow-Up: Your physician recommends that you schedule a follow-up appointment with Chanetta Marshall, NP in 3 months.  Remote monitoring is used to monitor your ICD from home. This monitoring reduces the number of office visits required to check your device to one time per year. It allows [place] to keep an eye on the functioning of your device to ensure it is working properly. You are scheduled for a device check from home on 01/05/2018. You may send your transmission at any time that day. If you have a wireless device, the transmission will be sent automatically. After your physician reviews your transmission, you will receive a postcard with your next transmission date.    Any Other Special Instructions Will Be Listed Below (If Applicable).     If you need a refill on your cardiac medications before your next appointment, please call your pharmacy.

## 2018-01-05 ENCOUNTER — Ambulatory Visit (INDEPENDENT_AMBULATORY_CARE_PROVIDER_SITE_OTHER): Payer: Medicare HMO | Admitting: *Deleted

## 2018-01-05 DIAGNOSIS — I472 Ventricular tachycardia, unspecified: Secondary | ICD-10-CM

## 2018-01-05 NOTE — Progress Notes (Signed)
Remote ICD transmission.   

## 2018-01-06 ENCOUNTER — Encounter: Payer: Self-pay | Admitting: Cardiology

## 2018-01-11 ENCOUNTER — Encounter: Payer: Self-pay | Admitting: Gastroenterology

## 2018-01-16 ENCOUNTER — Ambulatory Visit (HOSPITAL_COMMUNITY): Payer: Medicare HMO | Attending: Cardiology

## 2018-01-16 ENCOUNTER — Other Ambulatory Visit: Payer: Self-pay | Admitting: Internal Medicine

## 2018-01-16 DIAGNOSIS — Z95810 Presence of automatic (implantable) cardiac defibrillator: Secondary | ICD-10-CM

## 2018-01-16 DIAGNOSIS — I472 Ventricular tachycardia, unspecified: Secondary | ICD-10-CM

## 2018-01-16 DIAGNOSIS — I509 Heart failure, unspecified: Secondary | ICD-10-CM | POA: Insufficient documentation

## 2018-01-16 DIAGNOSIS — I110 Hypertensive heart disease with heart failure: Secondary | ICD-10-CM | POA: Diagnosis not present

## 2018-01-16 DIAGNOSIS — E119 Type 2 diabetes mellitus without complications: Secondary | ICD-10-CM | POA: Insufficient documentation

## 2018-01-16 DIAGNOSIS — I255 Ischemic cardiomyopathy: Secondary | ICD-10-CM

## 2018-01-16 DIAGNOSIS — I482 Chronic atrial fibrillation, unspecified: Secondary | ICD-10-CM

## 2018-01-16 LAB — CUP PACEART INCLINIC DEVICE CHECK
Battery Remaining Longevity: 64 mo
Battery Voltage: 2.97 V
Brady Statistic AP VS Percent: 0 %
Brady Statistic RA Percent Paced: 0 %
Brady Statistic RV Percent Paced: 95.53 %
HighPow Impedance: 76 Ohm
Implantable Lead Location: 753860
Implantable Lead Model: 6943
Implantable Pulse Generator Implant Date: 20180423
Lead Channel Impedance Value: 230.327
Lead Channel Impedance Value: 232.653
Lead Channel Impedance Value: 241.412
Lead Channel Impedance Value: 285 Ohm
Lead Channel Impedance Value: 361 Ohm
Lead Channel Impedance Value: 418 Ohm
Lead Channel Impedance Value: 475 Ohm
Lead Channel Impedance Value: 817 Ohm
Lead Channel Impedance Value: 817 Ohm
Lead Channel Impedance Value: 836 Ohm
Lead Channel Pacing Threshold Amplitude: 1.375 V
Lead Channel Sensing Intrinsic Amplitude: 10.375 mV
Lead Channel Setting Pacing Amplitude: 2.5 V
Lead Channel Setting Pacing Pulse Width: 0.4 ms
Lead Channel Setting Pacing Pulse Width: 1 ms
Lead Channel Setting Sensing Sensitivity: 0.3 mV
MDC IDC LEAD IMPLANT DT: 20010723
MDC IDC LEAD IMPLANT DT: 20180423
MDC IDC LEAD LOCATION: 753858
MDC IDC MSMT LEADCHNL LV IMPEDANCE VALUE: 218.087
MDC IDC MSMT LEADCHNL LV IMPEDANCE VALUE: 222.34 Ohm
MDC IDC MSMT LEADCHNL LV IMPEDANCE VALUE: 456 Ohm
MDC IDC MSMT LEADCHNL LV IMPEDANCE VALUE: 513 Ohm
MDC IDC MSMT LEADCHNL LV IMPEDANCE VALUE: 779 Ohm
MDC IDC MSMT LEADCHNL LV IMPEDANCE VALUE: 817 Ohm
MDC IDC MSMT LEADCHNL LV IMPEDANCE VALUE: 817 Ohm
MDC IDC MSMT LEADCHNL LV PACING THRESHOLD AMPLITUDE: 1.875 V
MDC IDC MSMT LEADCHNL LV PACING THRESHOLD PULSEWIDTH: 1 ms
MDC IDC MSMT LEADCHNL RA IMPEDANCE VALUE: 4047 Ohm
MDC IDC MSMT LEADCHNL RV PACING THRESHOLD PULSEWIDTH: 0.4 ms
MDC IDC MSMT LEADCHNL RV SENSING INTR AMPL: 9.375 mV
MDC IDC SESS DTM: 20190211114901
MDC IDC SET LEADCHNL RV PACING AMPLITUDE: 3 V
MDC IDC STAT BRADY AP VP PERCENT: 0 %
MDC IDC STAT BRADY AS VP PERCENT: 0 %
MDC IDC STAT BRADY AS VS PERCENT: 0 %

## 2018-01-19 ENCOUNTER — Ambulatory Visit
Admission: RE | Admit: 2018-01-19 | Discharge: 2018-01-19 | Disposition: A | Discharge status: Home / Self Care | Payer: Medicare HMO | Source: Ambulatory Visit | Attending: Neurology | Admitting: Neurology

## 2018-01-19 DIAGNOSIS — I639 Cerebral infarction, unspecified: Secondary | ICD-10-CM

## 2018-01-20 LAB — CUP PACEART REMOTE DEVICE CHECK
Battery Voltage: 2.97 V
Brady Statistic RA Percent Paced: 0 %
HighPow Impedance: 77 Ohm
Implantable Lead Implant Date: 20180423
Implantable Lead Location: 753860
Implantable Lead Model: 6943
Implantable Pulse Generator Implant Date: 20180423
Lead Channel Impedance Value: 246.635
Lead Channel Impedance Value: 418 Ohm
Lead Channel Impedance Value: 456 Ohm
Lead Channel Impedance Value: 475 Ohm
Lead Channel Impedance Value: 513 Ohm
Lead Channel Impedance Value: 532 Ohm
Lead Channel Impedance Value: 874 Ohm
Lead Channel Impedance Value: 874 Ohm
Lead Channel Pacing Threshold Amplitude: 1.5 V
Lead Channel Pacing Threshold Pulse Width: 1 ms
Lead Channel Sensing Intrinsic Amplitude: 10.625 mV
Lead Channel Sensing Intrinsic Amplitude: 10.625 mV
Lead Channel Setting Pacing Amplitude: 2.25 V
Lead Channel Setting Pacing Pulse Width: 1 ms
Lead Channel Setting Sensing Sensitivity: 0.3 mV
MDC IDC LEAD IMPLANT DT: 20010723
MDC IDC LEAD LOCATION: 753858
MDC IDC MSMT BATTERY REMAINING LONGEVITY: 69 mo
MDC IDC MSMT LEADCHNL LV IMPEDANCE VALUE: 232.653
MDC IDC MSMT LEADCHNL LV IMPEDANCE VALUE: 241.412
MDC IDC MSMT LEADCHNL LV IMPEDANCE VALUE: 245.538
MDC IDC MSMT LEADCHNL LV IMPEDANCE VALUE: 261.164
MDC IDC MSMT LEADCHNL LV IMPEDANCE VALUE: 836 Ohm
MDC IDC MSMT LEADCHNL LV IMPEDANCE VALUE: 874 Ohm
MDC IDC MSMT LEADCHNL LV IMPEDANCE VALUE: 874 Ohm
MDC IDC MSMT LEADCHNL LV IMPEDANCE VALUE: 931 Ohm
MDC IDC MSMT LEADCHNL LV PACING THRESHOLD AMPLITUDE: 1.625 V
MDC IDC MSMT LEADCHNL RA IMPEDANCE VALUE: 4047 Ohm
MDC IDC MSMT LEADCHNL RV IMPEDANCE VALUE: 304 Ohm
MDC IDC MSMT LEADCHNL RV PACING THRESHOLD PULSEWIDTH: 0.4 ms
MDC IDC SESS DTM: 20190131052203
MDC IDC SET LEADCHNL RV PACING AMPLITUDE: 3 V
MDC IDC SET LEADCHNL RV PACING PULSEWIDTH: 0.4 ms
MDC IDC STAT BRADY AP VP PERCENT: 0 %
MDC IDC STAT BRADY AP VS PERCENT: 0 %
MDC IDC STAT BRADY AS VP PERCENT: 0 %
MDC IDC STAT BRADY AS VS PERCENT: 0 %
MDC IDC STAT BRADY RV PERCENT PACED: 95.92 %

## 2018-01-24 ENCOUNTER — Telehealth: Payer: Self-pay

## 2018-01-24 ENCOUNTER — Ambulatory Visit: Payer: Medicare HMO | Admitting: Neurology

## 2018-01-24 NOTE — Telephone Encounter (Signed)
Called and spoke with wife, advsd of CT results

## 2018-01-24 NOTE — Telephone Encounter (Signed)
-----   Message from Pieter Partridge, DO sent at 01/20/2018 12:01 PM EST ----- CT shows resolution of the bleed.

## 2018-01-27 ENCOUNTER — Telehealth (HOSPITAL_COMMUNITY): Payer: Self-pay

## 2018-01-27 ENCOUNTER — Encounter: Payer: Self-pay | Admitting: Neurology

## 2018-01-27 ENCOUNTER — Ambulatory Visit: Payer: Medicare HMO | Admitting: Neurology

## 2018-01-27 VITALS — BP 104/64 | HR 90 | Ht 72.0 in | Wt 222.5 lb

## 2018-01-27 DIAGNOSIS — I6203 Nontraumatic chronic subdural hemorrhage: Secondary | ICD-10-CM

## 2018-01-27 DIAGNOSIS — G4483 Primary cough headache: Secondary | ICD-10-CM | POA: Diagnosis not present

## 2018-01-27 NOTE — Progress Notes (Signed)
NEUROLOGY FOLLOW UP OFFICE NOTE  Carl Wood [id number]  HISTORY OF PRESENT ILLNESS: Carl Wood is a 77 year old right-handed male with chronic atrial fibrillation with PPM on anticoagulation, ischemic cardiomyopathy, chronic systolic heart failure, CAD, type 2 diabetes mellitus, hypertension, hyperlipidemia, OSA, former smoker and history of embolic stroke who follows up for headache.  CT/CTA of head personally reviewed.  He is accompanied by his wife and daughter who supplement history.  UPDATE: He underwent workup for headache.  Sed rate was 11.  CT of head on 11/15/17 demonstrated small chronic subdural hematomas which were not seen on prior head CT in July.  CTA of the head demonstrated no intracranial vascular abnormality.  He denied any trauma to the head.  I spoke with his cardiologist, Dr. Haroldine Laws and Xarelto was discontinued.  Repeat head CT from 02/16/18 demonstrated resolution of the subdural hematomas.  The headache is still unchanged.  His OSA is controlled with CPAP.  Eye exam was normal.  He takes Tylenol about 2 days out of the week.     HISTORY: In June, he started experiencing a new onset headache.  The previous month, he had a couple of falls in which he hit both sides of his head.  It occurs only when he coughs, sneezes or bends over.  It is a severe pressure-like pain behind his eyes and temples.  It lasts for a few seconds.  There is no associated nausea, vomiting, dizziness, photophobia, phonophobia or visual disturbance.  It occurs almost daily. Nothing specifically relieves it as it resolves in seconds.  He does not take pain relievers for it.  It has been unchanged.   Initially, he noted pressure behind the eyes and was noted to have effusion in the ears.  He was referred to ENT for sinusitis.  CT maxillofacial sinuses from 06/13/17 were unremarkable.  Formal eye exam was reportedly unremarkable.   He denies any preceding event such as head trauma or change  in medication.  He has OSA but is compliant with his CPAP.  He drinks caffeinated soda once in a while.  He is not using decongestants.   He has no personal history of headache.  PAST MEDICAL HISTORY: Past Medical History:  Diagnosis Date  . CAD (coronary artery disease)   . CHF (congestive heart failure) ([place])   . Chronic atrial fibrillation ([place])   . Colon polyps   . Diabetes mellitus   . Diverticulosis of colon   . DIVERTICULOSIS, COLON 10/16/2006   Qualifier: Diagnosis of  By: Leanne Chang MD, [place]    . Excessive daytime sleepiness 02/19/2016  . Hyperlipidemia   . Hypertension   . MI (mitral incompetence)   . Nephrolithiasis   . NEPHROLITHIASIS 06/26/2008   Qualifier: Diagnosis of  By: Sherlynn Stalls, CMA, [place]    . OSA (obstructive sleep apnea) 02/19/2016   Moderate to severe OSA with an AHI of 25/hr and on CPAP at 8cm H2O  . PE (pulmonary embolism)   . V-tach [facility])     MEDICATIONS: Current Outpatient Medications on File Prior to Visit  Medication Sig Dispense Refill  . ACCU-CHEK AVIVA PLUS test strip TEST BLOOD SUGAR  DAILY 100 each 12  . atorvastatin (LIPITOR) 80 MG tablet TAKE ONE TABLET BY MOUTH ONCE DAILY AS DIRECTED 30 tablet 5  . budesonide-formoterol (SYMBICORT) 80-4.5 MCG/ACT inhaler Inhale 2 puffs into the lungs 2 (two) times daily. 1 Inhaler 2  . carvedilol (COREG) 12.5 MG tablet Take 0.5 tablets (6.25 mg  total) by mouth 2 (two) times daily with a meal. 60 tablet 6  . digoxin (LANOXIN) 0.25 MG tablet TAKE ONE-HALF TABLET BY MOUTH ONCE DAILY 45 tablet 3  . furosemide (LASIX) 20 MG tablet TAKE ONE TABLET BY MOUTH ONCE DAILY AS NEEDED FOR  WEIGHT  GAIN  OF  3  POUNDS  IN  24  HOURS 30 tablet 3  . glimepiride (AMARYL) 4 MG tablet TAKE ONE TABLET BY MOUTH ONCE DAILY BEFORE BREAKFAST 90 tablet 1  . metFORMIN (GLUCOPHAGE) 1000 MG tablet Take 1 tablet (1,000 mg total) by mouth 2 (two) times daily with a meal. 180 tablet 3  . sacubitril-valsartan (ENTRESTO) 49-51 MG Take 1 tablet by  mouth 2 (two) times daily. 60 tablet 6   No current facility-administered medications on file prior to visit.     ALLERGIES: Allergies  Allergen Reactions  . Penicillins Other (See Comments)    Patient passed out  . Sulfamethoxazole Rash    FAMILY HISTORY: Family History  Problem Relation Age of Onset  . Colon cancer Father   . Diabetes Father   . Heart disease Father   . Bladder Cancer Mother   . Heart disease Mother   . Heart attack Child 28  . Heart attack Child 27    SOCIAL HISTORY: Social History   Socioeconomic History  . Marital status: Married    Spouse name: [name]  . Number of children: 3  . Years of education: 20  . Highest education level: Not on file  Social Needs  . Financial resource strain: Not on file  . Food insecurity - worry: Not on file  . Food insecurity - inability: Not on file  . Transportation needs - medical: Not on file  . Transportation needs - non-medical: Not on file  Occupational History  . Occupation: Retired    Fish farm manager: RETIRED  Tobacco Use  . Smoking status: Former Smoker    Packs/day: 1.00    Years: 50.00    Pack years: 50.00    Types: Cigarettes    Last attempt to quit: 12/15/2012    Years since quitting: 5.1  . Smokeless tobacco: Never Used  Substance and Sexual Activity  . Alcohol use: No    Alcohol/week: 0.0 oz  . Drug use: No  . Sexual activity: Yes    Birth control/protection: None  Other Topics Concern  . Not on file  Social History Narrative   Married. 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      Hobbies: woodwork, fishing, some shooting    [place]: Constitutional: No fevers, chills, or sweats, no generalized fatigue, change in appetite Eyes: No visual changes, double vision, eye pain Ear, nose and throat: No hearing loss, ear pain, nasal congestion,  sore throat Cardiovascular: No chest pain, palpitations Respiratory:  No shortness of breath at rest or with exertion, wheezes GastrointestinaI: No nausea, vomiting, diarrhea, abdominal pain, fecal incontinence Genitourinary:  No dysuria, urinary retention or frequency Musculoskeletal:  No neck pain, back pain Integumentary: No rash, pruritus, skin lesions Neurological: as above Psychiatric: No depression, insomnia, anxiety Endocrine: No palpitations, fatigue, diaphoresis, mood swings, change in appetite, change in weight, increased thirst Hematologic/Lymphatic:  No purpura, petechiae. Allergic/Immunologic: no itchy/runny eyes, nasal congestion, recent allergic reactions, rashes  PHYSICAL EXAM: Vitals:   01/27/18 1022  BP: 104/64  Pulse: 90  SpO2: 92%   General: No acute distress.  Patient appears well-groomed.   Head:  Normocephalic/atraumatic Eyes:  Fundi examined but not visualized Neck: supple, no paraspinal tenderness, full range of motion Heart:  Regular rate and rhythm Lungs:  Clear to auscultation bilaterally Back: No paraspinal tenderness Neurological Exam: alert and oriented to person, place, and time. Attention span and concentration intact, recent and remote memory intact, fund of knowledge intact.  Speech fluent and not dysarthric, language intact.  CN II-XII intact. Bulk and tone normal, muscle strength 5/5 throughout.  Sensation to light touch  intact.  Deep tendon reflexes 2+ throughout.  Finger to nose testing intact.  Gait normal, Romberg negative.  IMPRESSION: 1.  Chronic subdural hematomas, resolved.  Likely secondary to trauma last spring.  It was not seen on CT of maxillary facial sinuses, but there is no other explanation. 2.  Cough headache/chronic tension type headache.  Onset secondary to post-trauma and subdural hematoma.  Still persists. 3.  Atrial fibrillation with history of ischemic cardioembolic stroke.  Given that the subdural hematoma was likely  traumatic with no ongoing cause for recurrent bleeding, and that he has a CHA2DS1-VASc score of 6, I think he may resume Xarelto.  The risk of thrombotic event outweighs risk of recurrent hemorrhage.  PLAN: 1.  He declines starting a daily preventative medication at this time.  Recommended the following:  - magnesium, riboflavin, CoQ10 (advised to check with cardiology to make sure it does not interfere with other medication)  - Increase hydration   -  Caffeine cessation  -  Limit Tylenol to no more than 2 days out of week 2.  From my standpoint, he may resume Xarelto.  He should discuss with cardiology first.  3.  If headaches persist and he would like to try a daily headache preventative medication, he should follow up with me.  19 minutes spent face to face with patient, over 50% spent discussing CT results and management.  Metta Clines, DO  CC:  [name], MD  Glori Bickers, MD

## 2018-01-27 NOTE — Telephone Encounter (Signed)
Medication Samples have been provided to the patient.  Drug name: xarelto       Strength: 20 mg        Qty: 4  LOT: [id number]  Exp.Date: 3/21  Dosing instructions: Take 1 tab every evening with dinner   The patient has been instructed regarding the correct time, dose, and frequency of taking this medication, including desired effects and most common side effects.   Shirley Muscat 12:07 PM 01/27/2018

## 2018-01-27 NOTE — Patient Instructions (Signed)
1.  Limit use of pain relievers to no more than 2 days out of the week.  These medications include acetaminophen, ibuprofen, triptans and narcotics.  This will help reduce risk of rebound headaches. 2.  Avoid caffeine 3. Proper sleep hygiene 4.  Stay adequately hydrated with water 5. Consider supplements:  Magnesium citrate 400mg  to 600mg  daily, riboflavin 400mg , Coenzyme Q 10 100mg  three times daily.  Check with the cardiologist to see if it does not interact with your heart medication. 6.  From my standpoint, you may resume Xarelto 7.  If headaches do not improve, follow up with me.

## 2018-02-03 ENCOUNTER — Ambulatory Visit: Payer: Medicare HMO | Admitting: Family Medicine

## 2018-02-14 ENCOUNTER — Other Ambulatory Visit: Payer: Self-pay | Admitting: Internal Medicine

## 2018-02-21 ENCOUNTER — Other Ambulatory Visit: Payer: Self-pay | Admitting: Family Medicine

## 2018-02-22 ENCOUNTER — Other Ambulatory Visit: Payer: Self-pay | Admitting: Family Medicine

## 2018-02-27 ENCOUNTER — Other Ambulatory Visit: Payer: Self-pay | Admitting: Family Medicine

## 2018-02-27 ENCOUNTER — Other Ambulatory Visit: Payer: Self-pay

## 2018-02-27 MED ORDER — RIVAROXABAN 20 MG PO TABS: 20.0000 mg | ORAL_TABLET | Freq: Every day | ORAL | 2 refills | Status: DC

## 2018-02-28 NOTE — Progress Notes (Signed)
Electrophysiology Office Note Date: 03/09/2018  ID:  Carl Wood, DOB [birth year], MRN [id number]  PCP: Marin Olp, MD Primary Cardiologist: [place] Electrophysiologist: Caryl Comes  CC: Routine ICD follow-up  Carl Wood is a 77 y.o. male seen today for Dr Caryl Comes.  He presents today for routine electrophysiology followup.  Since last being seen in our clinic, the patient reports doing relatively well.  He did not have symptomatic improvement with addition of LV lead.  He is seen with his wife today who reports that he sleeps a lot during the day. He has good days and bad days.  He is short of breath when walking without assistance.  He denies chest pain, palpitations, dyspnea (above baseline), PND, orthopnea, nausea, vomiting, dizziness, syncope, edema, weight gain, or early satiety.  He has not had ICD shocks.   Device History: MDT single chamber ICD implanted 1997 for ICM; gen change 2001; gen change 2009, gen change 2018 History of appropriate therapy: yes History of AAD therapy: No   Past Medical History:  Diagnosis Date  . CAD (coronary artery disease)   . CHF (congestive heart failure) ([place])   . Chronic atrial fibrillation ([place])   . Colon polyps   . Diabetes mellitus   . Diverticulosis of colon   . DIVERTICULOSIS, COLON 10/16/2006   Qualifier: Diagnosis of  By: Leanne Chang MD, [place]    . Excessive daytime sleepiness 02/19/2016  . Hyperlipidemia   . Hypertension   . MI (mitral incompetence)   . Nephrolithiasis   . NEPHROLITHIASIS 06/26/2008   Qualifier: Diagnosis of  By: Sherlynn Stalls, CMA, [place]    . OSA (obstructive sleep apnea) 02/19/2016   Moderate to severe OSA with an AHI of 25/hr and on CPAP at 8cm H2O  . PE (pulmonary embolism)   . V-tach [facility])    Past Surgical History:  Procedure Laterality Date  . BACK SURGERY    . BASAL CELL CARCINOMA EXCISION     nose  . BIV UPGRADE N/A 03/28/2017   Procedure: BiV Upgrade;  Surgeon: Deboraha Sprang, MD;  Location: [facility];  Service: Cardiovascular;  Laterality: N/A;  . CARDIAC CATHETERIZATION N/A 01/20/2016   Procedure: Right/Left Heart Cath and Coronary Angiography;  Surgeon: Jolaine Artist, MD;  Location: [facility];  Service: Cardiovascular;  Laterality: N/A;  . CARDIAC DEFIBRILLATOR PLACEMENT     medtronic virtuoso  . CHOLECYSTECTOMY    . COLONOSCOPY  12/11/2012   Procedure: COLONOSCOPY;  Surgeon: Inda Castle, MD;  Location: [facility];  Service: Endoscopy;  Laterality: N/A;  . KNEE ARTHROPLASTY      Current Outpatient Medications  Medication Sig Dispense Refill  . ACCU-CHEK AVIVA PLUS test strip TEST BLOOD SUGAR  DAILY 100 each 12  . atorvastatin (LIPITOR) 80 MG tablet TAKE ONE TABLET BY MOUTH ONCE DAILY AS DIRECTED 30 tablet 5  . budesonide-formoterol (SYMBICORT) 80-4.5 MCG/ACT inhaler Inhale 2 puffs into the lungs 2 (two) times daily. 1 Inhaler 2  . carvedilol (COREG) 12.5 MG tablet Take 0.5 tablets (6.25 mg total) by mouth 2 (two) times daily with a meal. 90 tablet 2  . digoxin (LANOXIN) 0.25 MG tablet TAKE ONE-HALF TABLET BY MOUTH ONCE DAILY 45 tablet 3  . furosemide (LASIX) 20 MG tablet TAKE ONE TABLET BY MOUTH ONCE DAILY AS NEEDED FOR  WEIGHT  GAIN  OF  3  POUNDS  IN  24  HOURS 30 tablet 3  . glimepiride (AMARYL) 4 MG tablet TAKE  1 TABLET BY MOUTH ONCE DAILY BEFORE BREAKFAST 90 tablet 1  . metFORMIN (GLUCOPHAGE) 1000 MG tablet Take 1 tablet (1,000 mg total) by mouth 2 (two) times daily with a meal. 180 tablet 3  . rivaroxaban (XARELTO) 20 MG TABS tablet Take 1 tablet (20 mg total) by mouth daily. 90 tablet 2  . sacubitril-valsartan (ENTRESTO) 49-51 MG Take 1 tablet by mouth 2 (two) times daily. 60 tablet 6   No current facility-administered medications for this visit.     Allergies:   Penicillins and Sulfamethoxazole   Social History: Social History   Socioeconomic History  . Marital status: Married    Spouse name: [name]  . Number of children: 3  . Years of  education: 66  . Highest education level: Not on file  Occupational History  . Occupation: Retired    Fish farm manager: RETIRED  Social Needs  . Financial resource strain: Not on file  . Food insecurity:    Worry: Not on file    Inability: Not on file  . Transportation needs:    Medical: Not on file    Non-medical: Not on file  Tobacco Use  . Smoking status: Former Smoker    Packs/day: 1.00    Years: 50.00    Pack years: 50.00    Types: Cigarettes    Last attempt to quit: 12/15/2012    Years since quitting: 5.2  . Smokeless tobacco: Never Used  Substance and Sexual Activity  . Alcohol use: No    Alcohol/week: 0.0 oz  . Drug use: No  . Sexual activity: Yes    Birth control/protection: None  Lifestyle  . Physical activity:    Days per week: Not on file    Minutes per session: Not on file  . Stress: Not on file  Relationships  . Social connections:    Talks on phone: Not on file    Gets together: Not on file    Attends religious service: Not on file    Active member of club or organization: Not on file    Attends meetings of clubs or organizations: Not on file    Relationship status: Not on file  . Intimate partner violence:    Fear of current or ex partner: Not on file    Emotionally abused: Not on file    Physically abused: Not on file    Forced sexual activity: Not on file  Other Topics Concern  . Not on file  Social History Narrative   Married. 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 Colon cancer Father   . Diabetes Father   . Heart disease Father   . Bladder Cancer Mother   . Heart disease Mother   . Heart attack Child 28  . Heart attack Child 47    Review of Systems: All other systems reviewed and  are otherwise negative except as noted above.   Physical Exam: VS:  BP 100/60   Pulse 92   Ht 6' (1.829 m)   Wt 218 lb (98.9 kg)   SpO2 92%   BMI 29.57 kg/m  , BMI Body mass index is 29.57 kg/m.  GEN- The patient is well appearing, alert and oriented x 3 today.  HEENT: normocephalic, atraumatic; sclera clear, conjunctiva pink; hearing intact; oropharynx clear; neck supple  Lungs- Clear to ausculation bilaterally, normal work of breathing.  No wheezes, rales, rhonchi Heart- Regular rate and rhythm  GI- soft, non-tender, non-distended, bowel sounds present  Extremities- no clubbing, cyanosis, or edema  MS- no significant deformity or atrophy Skin- warm and dry, no rash or lesion; ICD pocket well healed Psych- euthymic mood, full affect Neuro- strength and sensation are intact  ICD interrogation- reviewed in detail today,  See PACEART report  EKG:  EKG is not ordered today.  Recent Labs: 06/27/2017: ALT 17; Hemoglobin 14.8; Platelets 161.0 09/14/2017: BUN 18; Creatinine, Ser 0.91; Potassium 4.8; Sodium 138   Wt Readings from Last 3 Encounters:  03/08/18 218 lb (98.9 kg)  01/27/18 222 lb 8 oz (100.9 kg)  01/02/18 221 lb (100.2 kg)     Other studies Reviewed: Additional studies/ records that were reviewed today include: Dr Olin Pia office notes   Assessment and Plan:  1.  Chronic systolic dysfunction euvolemic today Stable on an appropriate medical regimen Normal ICD function See Pace Art report No changes today Not improved after AV opt echo.  He has an appt with AHF clinic on on 4/15.  At last office visit, VAD was a consideration - he is reluctant to consider. I have encouraged him to report symptoms of worsening HF.   2.  OSA Continued compliance with CPAP encouraged  3.  Permanent atrial fibrillation Continue Xarelto for CHADS2VASC of 5  4.  CAD No recent ischemic symptoms Continue current therapy   Current medicines are reviewed at length with the patient  today.   The patient does not have concerns regarding his medicines.  The following changes were made today:  none  Labs/ tests ordered today include:   No orders of the defined types were placed in this encounter.    Disposition:   Follow up with [facility], ICM clinic, Dr Haroldine Laws as scheduled, Dr Caryl Comes 1 year    Signed, Chanetta Marshall, NP 03/09/2018 8:27 AM  [facility] [address] [place] [address] [phone] (office) [phone] (fax)

## 2018-03-06 ENCOUNTER — Other Ambulatory Visit: Payer: Self-pay | Admitting: Physician Assistant

## 2018-03-07 ENCOUNTER — Other Ambulatory Visit: Payer: Self-pay | Admitting: Internal Medicine

## 2018-03-07 MED ORDER — CARVEDILOL 12.5 MG PO TABS: 6.2500 mg | ORAL_TABLET | Freq: Two times a day (BID) | ORAL | 2 refills | Status: DC

## 2018-03-07 NOTE — Telephone Encounter (Signed)
This is a CHF pt 

## 2018-03-08 ENCOUNTER — Encounter: Payer: Self-pay | Admitting: Nurse Practitioner

## 2018-03-08 ENCOUNTER — Ambulatory Visit: Payer: Medicare HMO | Admitting: Nurse Practitioner

## 2018-03-08 VITALS — BP 100/60 | HR 92 | Ht 72.0 in | Wt 218.0 lb

## 2018-03-08 DIAGNOSIS — I2510 Atherosclerotic heart disease of native coronary artery without angina pectoris: Secondary | ICD-10-CM

## 2018-03-08 DIAGNOSIS — I482 Chronic atrial fibrillation: Secondary | ICD-10-CM | POA: Diagnosis not present

## 2018-03-08 DIAGNOSIS — I5022 Chronic systolic (congestive) heart failure: Secondary | ICD-10-CM | POA: Diagnosis not present

## 2018-03-08 DIAGNOSIS — G4733 Obstructive sleep apnea (adult) (pediatric): Secondary | ICD-10-CM | POA: Diagnosis not present

## 2018-03-08 DIAGNOSIS — I4821 Permanent atrial fibrillation: Secondary | ICD-10-CM

## 2018-03-08 NOTE — Patient Instructions (Addendum)
Medication Instructions:   Your physician recommends that you continue on your current medications as directed. Please refer to the Current Medication list given to you today.   If you need a refill on your cardiac medications before your next appointment, please call your pharmacy.  Labwork: NONE ORDERED  TODAY    Testing/Procedures:  NONE ORDERED  TODAY    Follow-Up:  Your physician wants you to follow-up in: [place] will receive a reminder letter in the mail two months in advance. If you don't receive a letter, please call our office to schedule the follow-up appointment.  Remote monitoring is used to monitor your Pacemaker of ICD from home. This monitoring reduces the number of office visits required to check your device to one time per year. It allows [place] to keep an eye on the functioning of your device to ensure it is working properly. You are scheduled for a device check from home on .  04-06-18 You may send your transmission at any time that day. If you have a wireless device, the transmission will be sent automatically. After your physician reviews your transmission, you will receive a postcard with your next transmission date.   Any Other Special Instructions Will Be Listed Below (If Applicable).

## 2018-03-09 LAB — CUP PACEART INCLINIC DEVICE CHECK
Implantable Lead Implant Date: 20180423
Implantable Lead Model: 6943
Implantable Pulse Generator Implant Date: 20180423
MDC IDC LEAD IMPLANT DT: 20010723
MDC IDC LEAD LOCATION: 753858
MDC IDC LEAD LOCATION: 753860
MDC IDC SESS DTM: 20190404091554

## 2018-03-20 ENCOUNTER — Ambulatory Visit (HOSPITAL_COMMUNITY)
Admission: RE | Admit: 2018-03-20 | Discharge: 2018-03-20 | Disposition: A | Discharge status: Home / Self Care | Payer: Medicare HMO | Source: Ambulatory Visit | Attending: Internal Medicine | Admitting: Internal Medicine

## 2018-03-20 ENCOUNTER — Encounter (HOSPITAL_COMMUNITY): Payer: Self-pay | Admitting: Internal Medicine

## 2018-03-20 VITALS — BP 107/64 | HR 87 | Wt 221.5 lb

## 2018-03-20 DIAGNOSIS — Z880 Allergy status to penicillin: Secondary | ICD-10-CM | POA: Diagnosis not present

## 2018-03-20 DIAGNOSIS — I5022 Chronic systolic (congestive) heart failure: Secondary | ICD-10-CM | POA: Diagnosis not present

## 2018-03-20 DIAGNOSIS — E785 Hyperlipidemia, unspecified: Secondary | ICD-10-CM | POA: Insufficient documentation

## 2018-03-20 DIAGNOSIS — Z87891 Personal history of nicotine dependence: Secondary | ICD-10-CM | POA: Diagnosis not present

## 2018-03-20 DIAGNOSIS — Z86010 Personal history of colonic polyps: Secondary | ICD-10-CM | POA: Diagnosis not present

## 2018-03-20 DIAGNOSIS — Z86711 Personal history of pulmonary embolism: Secondary | ICD-10-CM | POA: Diagnosis not present

## 2018-03-20 DIAGNOSIS — I219 Acute myocardial infarction, unspecified: Secondary | ICD-10-CM | POA: Diagnosis not present

## 2018-03-20 DIAGNOSIS — I255 Ischemic cardiomyopathy: Secondary | ICD-10-CM | POA: Diagnosis not present

## 2018-03-20 DIAGNOSIS — Z79899 Other long term (current) drug therapy: Secondary | ICD-10-CM | POA: Diagnosis not present

## 2018-03-20 DIAGNOSIS — I482 Chronic atrial fibrillation: Secondary | ICD-10-CM | POA: Diagnosis not present

## 2018-03-20 DIAGNOSIS — I252 Old myocardial infarction: Secondary | ICD-10-CM | POA: Diagnosis not present

## 2018-03-20 DIAGNOSIS — G4733 Obstructive sleep apnea (adult) (pediatric): Secondary | ICD-10-CM | POA: Diagnosis not present

## 2018-03-20 DIAGNOSIS — I2510 Atherosclerotic heart disease of native coronary artery without angina pectoris: Secondary | ICD-10-CM | POA: Diagnosis not present

## 2018-03-20 DIAGNOSIS — Z882 Allergy status to sulfonamides status: Secondary | ICD-10-CM | POA: Diagnosis not present

## 2018-03-20 DIAGNOSIS — I110 Hypertensive heart disease with heart failure: Secondary | ICD-10-CM | POA: Diagnosis not present

## 2018-03-20 DIAGNOSIS — Z95810 Presence of automatic (implantable) cardiac defibrillator: Secondary | ICD-10-CM | POA: Insufficient documentation

## 2018-03-20 DIAGNOSIS — I4821 Permanent atrial fibrillation: Secondary | ICD-10-CM

## 2018-03-20 DIAGNOSIS — Z8249 Family history of ischemic heart disease and other diseases of the circulatory system: Secondary | ICD-10-CM | POA: Diagnosis not present

## 2018-03-20 DIAGNOSIS — Z87442 Personal history of urinary calculi: Secondary | ICD-10-CM | POA: Insufficient documentation

## 2018-03-20 DIAGNOSIS — Z833 Family history of diabetes mellitus: Secondary | ICD-10-CM | POA: Insufficient documentation

## 2018-03-20 DIAGNOSIS — Z7902 Long term (current) use of antithrombotics/antiplatelets: Secondary | ICD-10-CM | POA: Diagnosis not present

## 2018-03-20 DIAGNOSIS — Z7984 Long term (current) use of oral hypoglycemic drugs: Secondary | ICD-10-CM | POA: Diagnosis not present

## 2018-03-20 DIAGNOSIS — E119 Type 2 diabetes mellitus without complications: Secondary | ICD-10-CM | POA: Insufficient documentation

## 2018-03-20 LAB — BASIC METABOLIC PANEL
ANION GAP: 10 (ref 5–15)
BUN: 13 mg/dL (ref 6–20)
CALCIUM: 9 mg/dL (ref 8.9–10.3)
CO2: 27 mmol/L (ref 22–32)
Chloride: 101 mmol/L (ref 101–111)
Creatinine, Ser: 0.98 mg/dL (ref 0.61–1.24)
Glucose, Bld: 299 mg/dL — ABNORMAL HIGH (ref 65–99)
Potassium: 4.2 mmol/L (ref 3.5–5.1)
Sodium: 138 mmol/L (ref 135–145)

## 2018-03-20 NOTE — Patient Instructions (Addendum)
Labs drawn today (if we do not call you, then your lab work was stable)   Your physician has requested that you have an echocardiogram. Echocardiography is a painless test that uses sound waves to create images of your heart. It provides your doctor with information about the size and shape of your heart and how well your heart's chambers and valves are working. This procedure takes approximately one hour. There are no restrictions for this procedure.  Your physician recommends that you schedule a follow-up appointment in: 4 months with Dr. Haroldine Laws an a echocardiogram  Please Call an Schedule Appointment (call in May)

## 2018-03-20 NOTE — Addendum Note (Signed)
Encounter addended by: Shirley Muscat, RN on: 03/20/2018 9:49 AM  Actions taken: Sign clinical note

## 2018-03-20 NOTE — Progress Notes (Signed)
Patient ID: Carl Wood, male   DOB: 11/28/1941, 77 y.o.   MRN: [id number]    Advanced Heart Failure Clinic Note   Referring Physician: Dr Yong Channel  Primary Care: Dr Yong Channel  Primary Cardiologist: EP: Dr Caryl Comes  Neurologists: Dr Leonie Man  HPI: Mr Carl Wood is a 77 year old with a history of MI 1983, ICM, chronic systolic heart failure s/p Medtronic single chamber ICD, CVA 2014, DM , VT, PE, ,htn, permanent A fib on Xarelto, former smoker 2014  referred to the HF clinic by Drs Caryl Comes and [facility] for worsening EF and increasing exercise intolerance.   Had CRT-D upgrade in April 2018.   In 12/18 saw Dr. Tomi Likens for chronic HA. Found to have small SDHs that were felt to be chronic after a previous fall when trimming vines. Xarelto stopped. F/u C 2/19 SDHs resolved. Xarelto restarted in 2/19  Returns today for HF follow up. Says he is feeling much better. Remains active in yard with weedeater and riding mower. Put out 55 bags of mulch. Denies SOB but says he gets fatigued after 15 mins and has to rest. . Weight stable. No falls. No need for extra lasix. No CP.    ICD interrogated personally in clinic: No VT. Optivol looks good. Activity 2 hours/day. CRT about 100% Personally reviewed   09/2016 Echo EF 25-30%, trace MR and TR.  06/2015 ECHO EF 20% Severely dilated mild TR 12/2012 ECHO EF 30-35%   07/2015: k 4.0 Creatinine 0.86  11/2015: K 4.4, creatinine 0.91    CPX 08/03/17 (bike)  FVC 2.37 (54%)    FEV1 1.47 (44%)     FEV1/FVC 62 (82%)     MVV 65 (50%)     Resting HR: 82 Peak HR: 107  (73% age predicted max HR) BP rest: 90/56 BP peak: 124/66 Peak VO2: 13.0 (62% predicted peak VO2) VE/VCO2 slope: 34 OUES: 1.73 Peak RER: 1.04 Ventilatory Threshold: 10.3 (49% predicted or measured peak VO2) VE/MVV: 75% O2pulse: 12  (86% predicted O2pulse)   CPX 04/29/17 (TM)  FVC 2.60 (59%)    FEV1 1.54 (46%)     FEV1/FVC 59 (79%)     MVV 64 (48%)     BP rest: 80/58 BP  peak: 122/64 Peak VO2: 14.5 (55% predicted peak VO2) VE/VCO2 slope: 31 OUES: 2.05 Peak RER: 1.04 Ventilatory Threshold: 12.2 (47% predicted or measured peak VO2) Peak RR 34 Peak Ventilation: 50.8 VE/MVV: 79% PETCO2 at peak: 34 O2pulse: 14  (88% predicted O2pulse)   RHC.LHC 01/20/2016 RA = 9 RV = 37/3/10 PA = 41/16 (25) PCW = 18 Fick cardiac output/index = 4.5/2.1 Ao = 115/55 (74) LV = 119/8/15 PVR = 1.5 WU SVR = 1141 FA sat = 95% PA sat = 59%, 64% Assessment: 1. Severe 1v CAD with high grade mid LAD lesion (anterior wall felt to be non-viable so treated medically)  2. Ischemic CM with EF 25-30% with akinesis of mid anterior wall and aneurysmal deformity of distal anterior wall and apex 3. Normal filling pressures with moderately reduced cardiac output  CPX 08/13/15 FVC 2.64 (58%)    FEV1 1.66 (46%)     FEV1/FVC 63 (82%)     Resting HR: 65 Peak HR: 113  (76% age predicted max HR) BP rest: 106/60 BP peak: 130/60 Peak VO2: 14.9 (55.2% predicted peak VO2) VE/VCO2 slope: 27.9 OUES: 2.11 Peak RER: 0.95 Ventilatory Threshold: 11.0 (40.8% predicted or measured peak VO2) Peak RR 28 Peak Ventilation: 41.4 VE/MVV: 55.9% PETCO2 at peak: 37  O2pulse: 12  (71% predicted O2pulse)   Past Medical History:  Diagnosis Date  . CAD (coronary artery disease)   . CHF (congestive heart failure) ([place])   . Chronic atrial fibrillation ([place])   . Colon polyps   . Diabetes mellitus   . Diverticulosis of colon   . DIVERTICULOSIS, COLON 10/16/2006   Qualifier: Diagnosis of  By: Leanne Chang MD, [place]    . Excessive daytime sleepiness 02/19/2016  . Hyperlipidemia   . Hypertension   . MI (mitral incompetence)   . Nephrolithiasis   . NEPHROLITHIASIS 06/26/2008   Qualifier: Diagnosis of  By: Sherlynn Stalls, CMA, [place]    . OSA (obstructive sleep apnea) 02/19/2016   Moderate to severe OSA with an AHI of 25/hr and on CPAP at 8cm H2O  . PE (pulmonary embolism)   . V-tach [facility])      Current Outpatient Medications  Medication Sig Dispense Refill  . ACCU-CHEK AVIVA PLUS test strip TEST BLOOD SUGAR  DAILY 100 each 12  . atorvastatin (LIPITOR) 80 MG tablet TAKE ONE TABLET BY MOUTH ONCE DAILY AS DIRECTED 30 tablet 5  . budesonide-formoterol (SYMBICORT) 80-4.5 MCG/ACT inhaler Inhale 2 puffs into the lungs 2 (two) times daily. 1 Inhaler 2  . carvedilol (COREG) 12.5 MG tablet Take 0.5 tablets (6.25 mg total) by mouth 2 (two) times daily with a meal. 90 tablet 2  . digoxin (LANOXIN) 0.25 MG tablet TAKE ONE-HALF TABLET BY MOUTH ONCE DAILY 45 tablet 3  . furosemide (LASIX) 20 MG tablet TAKE ONE TABLET BY MOUTH ONCE DAILY AS NEEDED FOR  WEIGHT  GAIN  OF  3  POUNDS  IN  24  HOURS 30 tablet 3  . glimepiride (AMARYL) 4 MG tablet TAKE 1 TABLET BY MOUTH ONCE DAILY BEFORE BREAKFAST 90 tablet 1  . metFORMIN (GLUCOPHAGE) 1000 MG tablet Take 1 tablet (1,000 mg total) by mouth 2 (two) times daily with a meal. 180 tablet 3  . rivaroxaban (XARELTO) 20 MG TABS tablet Take 1 tablet (20 mg total) by mouth daily. 90 tablet 2  . sacubitril-valsartan (ENTRESTO) 49-51 MG Take 1 tablet by mouth 2 (two) times daily. 60 tablet 6   No current facility-administered medications for this encounter.     Allergies  Allergen Reactions  . Penicillins Other (See Comments)    Patient passed out  . Sulfamethoxazole Rash      Social History   Socioeconomic History  . Marital status: Married    Spouse name: [name]  . Number of children: 3  . Years of education: 20  . Highest education level: Not on file  Occupational History  . Occupation: Retired    Fish farm manager: RETIRED  Social Needs  . Financial resource strain: Not on file  . Food insecurity:    Worry: Not on file    Inability: Not on file  . Transportation needs:    Medical: Not on file    Non-medical: Not on file  Tobacco Use  . Smoking status: Former Smoker    Packs/day: 1.00    Years: 50.00    Pack years: 50.00    Types: Cigarettes     Last attempt to quit: 12/15/2012    Years since quitting: 5.2  . Smokeless tobacco: Never Used  Substance and Sexual Activity  . Alcohol use: No    Alcohol/week: 0.0 oz  . Drug use: No  . Sexual activity: Yes    Birth control/protection: None  Lifestyle  . Physical activity:    Days per week:  Not on file    Minutes per session: Not on file  . Stress: Not on file  Relationships  . Social connections:    Talks on phone: Not on file    Gets together: Not on file    Attends religious service: Not on file    Active member of club or organization: Not on file    Attends meetings of clubs or organizations: Not on file    Relationship status: Not on file  . Intimate partner violence:    Fear of current or ex partner: Not on file    Emotionally abused: Not on file    Physically abused: Not on file    Forced sexual activity: Not on file  Other Topics Concern  . Not on file  Social History Narrative   Married. 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 Colon cancer Father   . Diabetes Father   . Heart disease Father   . Bladder Cancer Mother   . Heart disease Mother   . Heart attack Child 28  . Heart attack Child 47    Vitals:   03/20/18 0915  BP: 107/64  Pulse: 87  SpO2: 95%  Weight: 221 lb 8 oz (100.5 kg)    PHYSICAL EXAM: General:  Well appearing. No resp difficulty HEENT: normal Neck: supple. JVP 5-6. Carotids 2+ bilat; no bruits. No lymphadenopathy or thryomegaly appreciated. Cor: PMI nondisplaced. Iregular rate & rhythm. No rubs, gallops or murmurs. Lungs: clear Abdomen: soft, nontender, nondistended. No hepatosplenomegaly. No bruits or masses. Good bowel sounds. Extremities: no cyanosis, clubbing, rash, edema Neuro:  alert & orientedx3, cranial nerves grossly intact. moves all 4 extremities w/o difficulty. Affect pleasant   ASSESSMENT & PLAN:  1. Chronic Systolic HF: ICM, s/p Medtronic Bi-V ICD. Echo 09/2016 EF 25-30%.  - Stable NYHA II-III - CPX testing on 5/18pVO2 down 14.5->13.0 but he feels better and is able to do all ADLs and his yard work without too much difficulty - Overall stable. If deteriorates any - would consider repeat CPX. Will repeat echo at next visit - Will continue to follow closely. If he gets worse can consider VAD (RV ok on echo)  - Volume status looks good on exam and Optivol.  - No VT on ICD interrogation - Continue Coreg to 6.25 mg BID. (recently reduced from 12.5 bid) - Continue Entresto 49/51 mg BID. Was previosuly on full dose but had to cut back.  - No Spiro with history of hyperkalemia and soft BP.  - Labs today   2. OSA - Uses CPAP nightly.   3. Former smoker  - Quit in 2014.   4. Chronic A fib - Rate controlled. Xarelto restarted after resolution SDH.   5. Lower extremity fatigue: - No PAD on vascular US in 12/2015  6. CAD: - Has known distal LAD lesion 95% and mid LAD 40%. No benefit to revascularization given wall motion abnormality.  - No s/s ischemia. Continue statin and ASA.    Glori Bickers, MD  03/20/2018

## 2018-03-20 NOTE — Addendum Note (Signed)
Encounter addended by: Shirley Muscat, RN on: 03/20/2018 9:56 AM  Actions taken: Order list changed, Diagnosis association updated

## 2018-03-20 NOTE — Addendum Note (Signed)
Encounter addended by: Shirley Muscat, RN on: 03/20/2018 9:48 AM  Actions taken: Order list changed, Diagnosis association updated, Sign clinical note

## 2018-03-23 ENCOUNTER — Ambulatory Visit (INDEPENDENT_AMBULATORY_CARE_PROVIDER_SITE_OTHER): Payer: Medicare HMO | Admitting: Family Medicine

## 2018-03-23 ENCOUNTER — Encounter: Payer: Self-pay | Admitting: Family Medicine

## 2018-03-23 VITALS — BP 100/66 | HR 88 | Temp 97.5°F | Ht 72.0 in | Wt 221.2 lb

## 2018-03-23 DIAGNOSIS — B078 Other viral warts: Secondary | ICD-10-CM

## 2018-03-23 DIAGNOSIS — I6203 Nontraumatic chronic subdural hemorrhage: Secondary | ICD-10-CM | POA: Diagnosis not present

## 2018-03-23 DIAGNOSIS — E119 Type 2 diabetes mellitus without complications: Secondary | ICD-10-CM

## 2018-03-23 DIAGNOSIS — I10 Essential (primary) hypertension: Secondary | ICD-10-CM | POA: Diagnosis not present

## 2018-03-23 DIAGNOSIS — I2782 Chronic pulmonary embolism: Secondary | ICD-10-CM | POA: Diagnosis not present

## 2018-03-23 LAB — POCT GLYCOSYLATED HEMOGLOBIN (HGB A1C): Hemoglobin A1C: 8.2

## 2018-03-23 MED ORDER — GLIMEPIRIDE 4 MG PO TABS: 8.0000 mg | ORAL_TABLET | Freq: Every day | ORAL | 3 refills | Status: DC

## 2018-03-23 NOTE — Progress Notes (Signed)
Subjective:  Carl Wood is a 77 y.o. year old very pleasant male patient who presents for/with See problem oriented charting ROS- No chest pain. no shortness of breath above baseline. cpmtomied headache. No edema or blurry vision.    Past Medical History-  Patient Active Problem List   Diagnosis Date Noted  . Chronic atrial fibrillation (HCC)     Priority: High  . Chronic systolic heart failure ([place]) 08/06/2015    Priority: High  . Former smoker 11/15/2014    Priority: High  . History of cardioembolic cerebrovascular accident (CVA) 12/19/2012    Priority: High  .  ventricular tachycardia-non sustained  02/17/2012    Priority: High  . AAA (abdominal aortic aneurysm) without rupture ([place]) 09/23/2009    Priority: High  . Diabetes mellitus type II, controlled ([place]) 10/16/2006    Priority: High  . CAD (coronary artery disease) 10/16/2006    Priority: High  . Chronic pulmonary embolism ([place]) 10/16/2006    Priority: High  . OSA (obstructive sleep apnea) 02/19/2016    Priority: Medium  . Carotid artery stenosis 03/17/2015    Priority: Medium  . Implantable cardioverter-defibrillator (ICD) in situ 02/17/2012    Priority: Medium  . History of skin cancer 07/05/2007    Priority: Medium  . Hyperlipidemia 10/16/2006    Priority: Medium  . Essential hypertension 10/16/2006    Priority: Medium  . LBBB (left bundle branch block) 08/06/2015    Priority: Low  . Actinic keratosis 11/15/2014    Priority: Low  . Benign neoplasm of colon 12/11/2012    Priority: Low  . Chronic subdural hematoma ([place]) 11/21/2017  . Frontal headache 10/06/2017  . Rhinitis 08/22/2017  . Dyspnea on exertion 08/22/2017    Medications- reviewed and updated Current Outpatient Medications  Medication Sig Dispense Refill  . ACCU-CHEK AVIVA PLUS test strip TEST BLOOD SUGAR  DAILY 100 each 12  . atorvastatin (LIPITOR) 80 MG tablet TAKE ONE TABLET BY MOUTH ONCE DAILY AS DIRECTED 30 tablet 5  .  budesonide-formoterol (SYMBICORT) 80-4.5 MCG/ACT inhaler Inhale 2 puffs into the lungs 2 (two) times daily. 1 Inhaler 2  . carvedilol (COREG) 12.5 MG tablet Take 0.5 tablets (6.25 mg total) by mouth 2 (two) times daily with a meal. 90 tablet 2  . digoxin (LANOXIN) 0.25 MG tablet TAKE ONE-HALF TABLET BY MOUTH ONCE DAILY 45 tablet 3  . furosemide (LASIX) 20 MG tablet TAKE ONE TABLET BY MOUTH ONCE DAILY AS NEEDED FOR  WEIGHT  GAIN  OF  3  POUNDS  IN  24  HOURS 30 tablet 3  . glimepiride (AMARYL) 4 MG tablet TAKE 1 TABLET BY MOUTH ONCE DAILY BEFORE BREAKFAST 90 tablet 1  . metFORMIN (GLUCOPHAGE) 1000 MG tablet Take 1 tablet (1,000 mg total) by mouth 2 (two) times daily with a meal. 180 tablet 3  . rivaroxaban (XARELTO) 20 MG TABS tablet Take 1 tablet (20 mg total) by mouth daily. 90 tablet 2  . sacubitril-valsartan (ENTRESTO) 49-51 MG Take 1 tablet by mouth 2 (two) times daily. 60 tablet 6   Objective: BP 100/66 (BP Location: Left Arm, Patient Position: Sitting, Cuff Size: Large)   Pulse 88   Temp (!) 97.5 F (36.4 C) (Oral)   Ht 6' (1.829 m)   Wt 221 lb 3.2 oz (100.3 kg)   SpO2 96%   BMI 30.00 kg/m  Gen: NAD, resting comfortably CV: Regular rate but irregular rhythm.  No murmurs rubs or gallops Lungs: CTAB no crackles, wheeze, rhonchi Abdomen:  soft/nontender/nondistended/normal bowel sounds.  Overweight Ext: no edema Skin: warm, dry. At least half mm papule with verruca like appearance on right index finger Neuro: Normal speech  Procedure note: Benefits and risks verbally discussed with patient- given place on side of finger warned of potential nerve irritation and ongoing numbness/tingling or pain 10 second freeze thaw cycle of cryotherapy performed on right index finger wart No complications.  Patient tolerated the procedure well other than mild pain.  Assessment/Plan:  Wart right index finger S: wart on  Right index finger laterally. Present about 6 weeks. Enlarging slightly.   A/P:Discussed nerve damage potential. 10 second cycle cyrotherapy performed as above. Sees derm in 2-3 weeks and can be rechecked. If not resolved would treat again every 2-3 weeks.   Hypertension- blood pressure remains low normal but for his heart failure coreg, entresto and lasix intermittent are needed- will not stop any at this point.  No problem-specific Assessment & Plan notes found for this encounter.   Future Appointments  Date Time Provider [place]  04/06/2018  9:35 AM [facility] DEVICE REMOTES [facility]  06/22/2018 11:00 AM Williemae Area, RN [facility]   No follow-ups on file.  Lab/Order associations: No diagnosis found.  No orders of the defined types were placed in this encounter.   Return precautions advised.  [name], MD

## 2018-03-23 NOTE — Patient Instructions (Addendum)
a1c up to 8.2. I increased glimepiride to 8mg . This is not the best long term solution. Also please watch out for low sugars under 70 and go back to 4mg  if this happens. Our team can refer you to diabetes education if you want to see if there are any ways we can tighten up the diet to help.   If wart still there in 3-4 weeks we will need to refreeze- dermatology can also look at this. Sometimes it takes several rounds of freezing.   Great to see you today!

## 2018-03-24 NOTE — Assessment & Plan Note (Signed)
As noted patient is very pleased to be back on Xarelto.  He does have a history of DVT.  Chronic anticoagulation helps with prevention obviously and gives him some peace of mind

## 2018-03-24 NOTE — Progress Notes (Signed)
We chose to increase glimepiride to 8 mg given A1c over 8.  Other options are preferred but are not cost effective for him as he has 3 very expensive medications

## 2018-03-24 NOTE — Assessment & Plan Note (Signed)
Luckily repeat scan showed resolution of subdural hematoma.  Patient was able to resume his Xarelto.  He had high concern about being off of his anticoagulant and he is very thankful to be back on it at this point.  He does continue to have daily headaches but declines medicine for prevention with neurology.

## 2018-03-24 NOTE — Assessment & Plan Note (Signed)
S: Poorly controlled. On metformin 1000mg  BID and glimepiride 4mg  one a day.  CBGs-160-190 in the mornings before he eats. Over 200 usually later in the day when he checks but has had a few around 80 Exercise and diet-plain shredded wheat or plain cheerios with some fruit and pecans, then eats around 3-4 PM- sandwich or salad- occasional steak at [employer] [place] once or twice a month.  Lab Results  Component Value Date   HGBA1C 8.2 03/23/2018   HGBA1C 7.0 10/06/2017   HGBA1C 8.6 (H) 06/27/2017   A/P: I do not think glimepiride is the best choice.  Would prefer Jardiance particular with heart failure.  Patient states simply cannot afford at this time to add another expensive medicine.  We will go up on glimepiride to 8 mg and continue max dose metformin.  I hope this gets him back under 8.  We also discussed dietary changes

## 2018-04-06 ENCOUNTER — Ambulatory Visit (INDEPENDENT_AMBULATORY_CARE_PROVIDER_SITE_OTHER): Payer: Medicare HMO | Admitting: *Deleted

## 2018-04-06 DIAGNOSIS — I472 Ventricular tachycardia, unspecified: Secondary | ICD-10-CM

## 2018-04-06 NOTE — Progress Notes (Signed)
Remote ICD transmission.   

## 2018-04-07 DIAGNOSIS — L821 Other seborrheic keratosis: Secondary | ICD-10-CM | POA: Diagnosis not present

## 2018-04-07 DIAGNOSIS — L814 Other melanin hyperpigmentation: Secondary | ICD-10-CM | POA: Diagnosis not present

## 2018-04-07 DIAGNOSIS — D1801 Hemangioma of skin and subcutaneous tissue: Secondary | ICD-10-CM | POA: Diagnosis not present

## 2018-04-07 DIAGNOSIS — L570 Actinic keratosis: Secondary | ICD-10-CM | POA: Diagnosis not present

## 2018-04-07 DIAGNOSIS — D229 Melanocytic nevi, unspecified: Secondary | ICD-10-CM | POA: Diagnosis not present

## 2018-04-10 ENCOUNTER — Other Ambulatory Visit: Payer: Self-pay | Admitting: Cardiology

## 2018-04-11 ENCOUNTER — Encounter: Payer: Self-pay | Admitting: Cardiology

## 2018-04-11 ENCOUNTER — Other Ambulatory Visit: Payer: Self-pay | Admitting: Internal Medicine

## 2018-04-18 LAB — CUP PACEART REMOTE DEVICE CHECK
Battery Remaining Longevity: 63 mo
Battery Voltage: 2.97 V
Brady Statistic AP VS Percent: 0 %
Brady Statistic AS VS Percent: 0 %
Brady Statistic RV Percent Paced: 95.73 %
HighPow Impedance: 76 Ohm
Implantable Lead Implant Date: 20010723
Implantable Lead Implant Date: 20180423
Implantable Lead Location: 753858
Implantable Lead Model: 6943
Implantable Pulse Generator Implant Date: 20180423
Lead Channel Impedance Value: 250.943
Lead Channel Impedance Value: 266 Ohm
Lead Channel Impedance Value: 399 Ohm
Lead Channel Impedance Value: 475 Ohm
Lead Channel Impedance Value: 532 Ohm
Lead Channel Impedance Value: 532 Ohm
Lead Channel Impedance Value: 551 Ohm
Lead Channel Impedance Value: 874 Ohm
Lead Channel Impedance Value: 931 Ohm
Lead Channel Impedance Value: 950 Ohm
Lead Channel Pacing Threshold Pulse Width: 0.4 ms
Lead Channel Sensing Intrinsic Amplitude: 11.25 mV
Lead Channel Setting Pacing Amplitude: 2.5 V
Lead Channel Setting Pacing Amplitude: 3.25 V
Lead Channel Setting Pacing Pulse Width: 1 ms
Lead Channel Setting Sensing Sensitivity: 0.3 mV
MDC IDC LEAD LOCATION: 753860
MDC IDC MSMT LEADCHNL LV IMPEDANCE VALUE: 250.943
MDC IDC MSMT LEADCHNL LV IMPEDANCE VALUE: 255.093
MDC IDC MSMT LEADCHNL LV IMPEDANCE VALUE: 270.667
MDC IDC MSMT LEADCHNL LV IMPEDANCE VALUE: 893 Ohm
MDC IDC MSMT LEADCHNL LV IMPEDANCE VALUE: 931 Ohm
MDC IDC MSMT LEADCHNL LV IMPEDANCE VALUE: 931 Ohm
MDC IDC MSMT LEADCHNL LV PACING THRESHOLD AMPLITUDE: 1.875 V
MDC IDC MSMT LEADCHNL LV PACING THRESHOLD PULSEWIDTH: 1 ms
MDC IDC MSMT LEADCHNL RA IMPEDANCE VALUE: 4047 Ohm
MDC IDC MSMT LEADCHNL RV IMPEDANCE VALUE: 285 Ohm
MDC IDC MSMT LEADCHNL RV PACING THRESHOLD AMPLITUDE: 1.625 V
MDC IDC MSMT LEADCHNL RV SENSING INTR AMPL: 11.25 mV
MDC IDC SESS DTM: 20190502083826
MDC IDC SET LEADCHNL RV PACING PULSEWIDTH: 0.4 ms
MDC IDC STAT BRADY AP VP PERCENT: 0 %
MDC IDC STAT BRADY AS VP PERCENT: 0 %
MDC IDC STAT BRADY RA PERCENT PACED: 0 %

## 2018-05-11 ENCOUNTER — Ambulatory Visit (INDEPENDENT_AMBULATORY_CARE_PROVIDER_SITE_OTHER): Payer: Medicare HMO

## 2018-05-11 ENCOUNTER — Encounter: Payer: Self-pay | Admitting: Family Medicine

## 2018-05-11 ENCOUNTER — Ambulatory Visit (INDEPENDENT_AMBULATORY_CARE_PROVIDER_SITE_OTHER): Payer: Medicare HMO | Admitting: Family Medicine

## 2018-05-11 VITALS — BP 110/62 | HR 94 | Temp 98.2°F | Ht 70.0 in | Wt 214.0 lb

## 2018-05-11 DIAGNOSIS — R05 Cough: Secondary | ICD-10-CM

## 2018-05-11 DIAGNOSIS — T50Z95A Adverse effect of other vaccines and biological substances, initial encounter: Secondary | ICD-10-CM | POA: Diagnosis not present

## 2018-05-11 DIAGNOSIS — R059 Cough, unspecified: Secondary | ICD-10-CM

## 2018-05-11 DIAGNOSIS — R0602 Shortness of breath: Secondary | ICD-10-CM | POA: Diagnosis not present

## 2018-05-11 MED ORDER — DOXYCYCLINE HYCLATE 100 MG PO TABS: 100.0000 mg | ORAL_TABLET | Freq: Two times a day (BID) | ORAL | 0 refills | Status: DC

## 2018-05-11 MED ORDER — FLUTICASONE PROPIONATE 50 MCG/ACT NA SUSP: 1.0000 | Freq: Every day | NASAL | 6 refills | Status: DC

## 2018-05-11 NOTE — Progress Notes (Signed)
Patient: Carl Wood MRN: [id number] DOB: 1941/09/15 PCP: Marin Olp, MD     Subjective:  Chief Complaint  Patient presents with  . Nasal Congestion  . Fever    chills, nausea vomiting since Tuesday when he had Shingrix vaccine?    HPI: The patient is a 77 y.o. male who presents today for fever and congestion. He had the Shingrix vaccine at [facility] on Tuesday of this week around 10AM. Around 4pm he noticed his arm was sore. Around midnight he got chills so bad his teeth were rattling then got hot and broke out in a sweat. He continued to have chills through the night. He had some nausea and one episode of vomiting. He woke up on Wednesday and was pretty puny all day. He also developed a cough and congestion and is coughing up green mucous. It takes forever to get up the mucous. Do not think he had a fever yesterday. He has decreased PO intake, but is trying to drink fluids. Also is weak. He weights daily and his weight has been stable. He has not needed his fluid pill and has not gained any weight. He is short of breath when he coughs. He has been around some sick contacts with similar symptoms of cough and congestion. He has not taken any over the counter medication for his cough. No hx of asthma/copd or smoking. He is on symbicort and has hx of rhinitis.   Review of Systems  Constitutional: Positive for chills, fatigue and fever.  HENT: Positive for congestion.   Eyes: Positive for pain.  Respiratory: Positive for cough, choking and shortness of breath. Negative for chest tightness.   Cardiovascular: Negative for leg swelling.  Gastrointestinal: Positive for nausea and vomiting.  Genitourinary: Positive for urgency. Negative for frequency and hematuria.  Musculoskeletal: Negative for back pain and gait problem.  Neurological: Positive for weakness and light-headedness.    Allergies Patient is allergic to penicillins and sulfamethoxazole.  Past Medical History Patient   has a past medical history of CAD (coronary artery disease), CHF (congestive heart failure) ([place]), Chronic atrial fibrillation ([place]), Colon polyps, Diabetes mellitus, Diverticulosis of colon, DIVERTICULOSIS, COLON (10/16/2006), Excessive daytime sleepiness (02/19/2016), Hyperlipidemia, Hypertension, MI (mitral incompetence), Nephrolithiasis, NEPHROLITHIASIS (06/26/2008), OSA (obstructive sleep apnea) (02/19/2016), PE (pulmonary embolism), and V-tach ([place]).  Surgical History Patient  has a past surgical history that includes Back surgery; Cardiac defibrillator placement; Excision basal cell carcinoma; Knee Arthroplasty; Cholecystectomy; Colonoscopy (12/11/2012); Cardiac catheterization (N/A, 01/20/2016); and BIV UPGRADE (N/A, 03/28/2017).  Family History Pateint's family history includes Bladder Cancer in his mother; Colon cancer in his father; Diabetes in his father; Heart attack (age of onset: 24) in his child; Heart attack (age of onset: 48) in his child; Heart disease in his father and mother.  Social History Patient  reports that he quit smoking about 5 years ago. His smoking use included cigarettes. He has a 50.00 pack-year smoking history. He has never used smokeless tobacco. He reports that he does not drink alcohol or use drugs.    Objective: Vitals:   05/11/18 0911  BP: 110/62  Pulse: 94  Temp: 98.2 F (36.8 C)  SpO2: 94%  Weight: 214 lb (97.1 kg)  Height: 5\' 10"  (1.778 m)    Body mass index is 30.71 kg/m.  Physical Exam  Constitutional: He appears well-developed and well-nourished.  HENT:  Right Ear: External ear normal.  Left Ear: External ear normal.  Nose: Nose normal.  Mouth/Throat: Oropharynx is clear and moist. No  oropharyngeal exudate.  Eyes: Conjunctivae are normal.  Cardiovascular: Normal rate, regular rhythm and normal heart sounds.  Pulmonary/Chest: Effort normal. He has no wheezes. He has no rales.  Decreased breath sounds in LLL   Abdominal: Soft. Bowel sounds are  normal.  Lymphadenopathy:    He has no cervical adenopathy.  Vitals reviewed.  CXR: ? Patchy infiltrates in LLL. Change from previous film. Waiting for official read.     Assessment/plan: 1. Adverse effect of vaccine, initial encounter I do think the chills were related to shingrix vaccine. Doing better from this point.   2. Cough Covering him for pneumonia as I see patchy infiltrates in LLL compared to previous film. Could also just be from his CHF as he has poor EF. Doing doxy to decrease drug interaction with his heart drugs. Side effects discussed. Also want him to start flonase and instructed on proper use of this medication to help congestion and his rhinitis. Cool mist humidifier prn and can use sugar free robitussin DM as well. Strict precautions given.   - DG Chest 2 View; Future   Return if symptoms worsen or fail to improve.     Orma Flaming, MD [place]  05/11/2018

## 2018-05-29 ENCOUNTER — Ambulatory Visit (INDEPENDENT_AMBULATORY_CARE_PROVIDER_SITE_OTHER): Payer: Medicare HMO | Admitting: Family Medicine

## 2018-05-29 ENCOUNTER — Encounter: Payer: Self-pay | Admitting: Family Medicine

## 2018-05-29 VITALS — BP 112/68 | HR 81 | Temp 98.1°F | Ht 70.0 in | Wt 210.6 lb

## 2018-05-29 DIAGNOSIS — M545 Low back pain, unspecified: Secondary | ICD-10-CM

## 2018-05-29 DIAGNOSIS — B9689 Other specified bacterial agents as the cause of diseases classified elsewhere: Secondary | ICD-10-CM

## 2018-05-29 DIAGNOSIS — J329 Chronic sinusitis, unspecified: Secondary | ICD-10-CM

## 2018-05-29 MED ORDER — DOXYCYCLINE HYCLATE 100 MG PO TABS: 100.0000 mg | ORAL_TABLET | Freq: Two times a day (BID) | ORAL | 0 refills | Status: DC

## 2018-05-29 NOTE — Progress Notes (Signed)
PCP: Marin Olp, MD  Subjective:  Carl Wood is a 77 y.o. year old very pleasant male patient who presents with sinusitis symptoms including cough with occasional white or green sputum, PND/throat congestion, minimal sinus tenderness -other symptoms include: no chest congestion. Started after shingles shot with fever, chills, throwing up, diarrhea. Saw Dr. Rogers Blocker and treated with flonase and doxycycline for possible PNA- he was improving then worsened over last week. Also dealing with back pain- see problem oriented charting -day of illness:3 weeks.  -Symptoms are worsening -previous treatments: rest, antibiotics and flonase as above -sick contacts/travel/risks: Wife had similar symptoms but hers is now gone  ROS-denies fever, increased SOB from baselie, recent NVD, tooth pain  Pertinent Past Medical History-  Patient Active Problem List   Diagnosis Date Noted  . History of subdural hematoma 11/21/2017    Priority: High  . Chronic atrial fibrillation (HCC)     Priority: High  . Chronic systolic heart failure ([place]) 08/06/2015    Priority: High  . Former smoker 11/15/2014    Priority: High  . History of cardioembolic cerebrovascular accident (CVA) 12/19/2012    Priority: High  .  ventricular tachycardia-non sustained  02/17/2012    Priority: High  . AAA (abdominal aortic aneurysm) without rupture ([place]) 09/23/2009    Priority: High  . Diabetes mellitus type II, controlled ([place]) 10/16/2006    Priority: High  . CAD (coronary artery disease) 10/16/2006    Priority: High  . Chronic pulmonary embolism ([place]) 10/16/2006    Priority: High  . OSA (obstructive sleep apnea) 02/19/2016    Priority: Medium  . Carotid artery stenosis 03/17/2015    Priority: Medium  . Implantable cardioverter-defibrillator (ICD) in situ 02/17/2012    Priority: Medium  . History of skin cancer 07/05/2007    Priority: Medium  . Hyperlipidemia 10/16/2006    Priority: Medium  . Essential  hypertension 10/16/2006    Priority: Medium  . LBBB (left bundle branch block) 08/06/2015    Priority: Low  . Actinic keratosis 11/15/2014    Priority: Low  . Benign neoplasm of colon 12/11/2012    Priority: Low  . Frontal headache 10/06/2017  . Rhinitis 08/22/2017  . Dyspnea on exertion 08/22/2017    Medications- reviewed  Current Outpatient Medications  Medication Sig Dispense Refill  . ACCU-CHEK AVIVA PLUS test strip TEST BLOOD SUGAR  DAILY 100 each 12  . atorvastatin (LIPITOR) 80 MG tablet TAKE ONE TABLET BY MOUTH ONCE DAILY AS DIRECTED 30 tablet 5  . budesonide-formoterol (SYMBICORT) 80-4.5 MCG/ACT inhaler Inhale 2 puffs into the lungs 2 (two) times daily. 1 Inhaler 2  . carvedilol (COREG) 12.5 MG tablet Take 0.5 tablets (6.25 mg total) by mouth 2 (two) times daily with a meal. 90 tablet 2  . carvedilol (COREG) 6.25 MG tablet Take 1 tablet (6.25 mg total) by mouth 2 (two) times daily with a meal. 60 tablet 3  . digoxin (LANOXIN) 0.25 MG tablet TAKE ONE-HALF TABLET BY MOUTH ONCE DAILY 45 tablet 3  . doxycycline (VIBRA-TABS) 100 MG tablet Take 1 tablet (100 mg total) by mouth 2 (two) times daily. 20 tablet 0  . ENTRESTO 49-51 MG TAKE 1 TABLET BY MOUTH TWICE DAILY 60 tablet 6  . fluticasone (FLONASE) 50 MCG/ACT nasal spray Place 1 spray into both nostrils daily. 16 g 6  . furosemide (LASIX) 20 MG tablet TAKE ONE TABLET BY MOUTH ONCE DAILY AS NEEDED FOR  WEIGHT  GAIN  OF  3  POUNDS  IN  24  HOURS 30 tablet 3  . glimepiride (AMARYL) 4 MG tablet Take 2 tablets (8 mg total) by mouth daily with breakfast. 180 tablet 3  . metFORMIN (GLUCOPHAGE) 1000 MG tablet Take 1 tablet (1,000 mg total) by mouth 2 (two) times daily with a meal. 180 tablet 3  . rivaroxaban (XARELTO) 20 MG TABS tablet Take 1 tablet (20 mg total) by mouth daily. 90 tablet 2   No current facility-administered medications for this visit.     Objective: BP 112/68 (BP Location: Left Arm, Patient Position: Sitting, Cuff  Size: Normal)   Pulse 81   Temp 98.1 F (36.7 C) (Oral)   Ht 5\' 10"  (1.778 m)   Wt 210 lb 9.6 oz (95.5 kg)   SpO2 95%   BMI 30.22 kg/m  Gen: NAD, resting comfortably HEENT: Turbinates erythematous with yellow drainage- some dried blood in left nare, TM normal, pharynx mildly erythematous with no tonsilar exudate or edema, minimal sinus tenderness CV: RRR no murmurs rubs or gallops Lungs: CTAB no crackles, wheeze, rhonchi Abdomen: soft/nontender/nondistended/normal bowel sounds. Ext: no edema Skin: warm, dry, no rash MSK: patient with spasmed paraspinous muscle in right low back. Able to stand but stands slowly due to back pain. Negative straight leg raise.   Diabetic Foot Exam - Simple   Simple Foot Form Diabetic Foot exam was performed with the following findings:  Yes 05/29/2018  3:38 PM  Visual Inspection No deformities, no ulcerations, no other skin breakdown bilaterally:  Yes Sensation Testing Intact to touch and monofilament testing bilaterally:  Yes Pulse Check Posterior Tibialis and Dorsalis pulse intact bilaterally:  Yes Comments    Assessment/Plan:  Low back pain S: Low back pain- hard to rise quickly from chair. Started yesterday in right low back. Worse after last night. No rx. Pain can be severe. No leg weakness. Fecal or urinary incontinence. No saddle anesthesia A/P: from avs  "I think you have pulled one of your muscles  Take 1-2 extra strength tylenol 8 hour pills every 8 hours for next 5 days (total of 1300 mg every 8 hours- if you prefer to just do twice a day that's fine as well)  plusI want you to ice your right low 3x a day for 20 minutes. After 3 days I want you to try heat 3x a day for 20 minutes.  See me back if not improving in 2-3 weeks or sooner if symptoms worsen or you have leg weakness, incontinence of bowel or bladder, or numbness and tingling into groin "  Sinsusitis Bacterial based on: Symptoms >10 days, double sickening (getting better then  getting worse)  Treatment: -considered steroid: we opted out for now but may consider prednisone if fails to improve within 10 days (though a1c above 8 so would prefer not to)  -other symptomatic care with plain mucinex -Antibiotic indicated: yes. I would usually use augmentin but he has penicillin allergy. Would then use azithromycin but this interacts with digoxin. levaquin not ideal with history of aneurysm of aorta. Ultimately will extend prior doxycycline to 10 days.   Finally, we reviewed reasons to return to care including if symptoms worsen or persist or new concerns arise (particularly fever or shortness of breath)  Meds ordered this encounter  Medications  . doxycycline (VIBRA-TABS) 100 MG tablet    Sig: Take 1 tablet (100 mg total) by mouth 2 (two) times daily.    Dispense:  20 tablet    Refill:  0  [name], MD

## 2018-05-29 NOTE — Patient Instructions (Addendum)
Health Maintenance Due  Topic Date Due  . FOOT EXAM - Today at office visit 05/05/2018   For long term health I would also like for you to sign up for an annual wellness visit with one of our nurses, Cassie or Manuela Schwartz, who both specialize in the annual wellness visit. This is a free benefit under medicare that may help [place] find additional ways to help you. Some highlights are reviewing medications, lifestyle, and doing a dementia screen.  For your back I think you have pulled one of your muscles  Take 1-2 extra strength tylenol 8 hour pills every 8 hours for next 5 days (total of 1300 mg every 8 hours- if you prefer to just do twice a day that's fine as well)  plusI want you to ice your right low 3x a day for 20 minutes. After 3 days I want you to try heat 3x a day for 20 minutes.  See me back if not improving in 2-3 weeks or sooner if symptoms worsen or you have leg weakness, incontinence of bowel or bladder, or numbness and tingling into groin   Sinsusitis Bacterial based on: Symptoms >10 days, double sickening (getting better then getting worse)  Treatment: -considered steroid: we opted out for now but may consider prednisone if fails to improve within 10 days (though a1c above 8 so would prefer not to)  -other symptomatic care with plain mucinex -Antibiotic indicated: yes. I would usually use augmentin but he has penicillin allergy. Would then use azithromycin but this interacts with digoxin. levaquin not ideal with history of aneurysm of aorta. Ultimately will extend prior doxycycline to 10 days.   Finally, we reviewed reasons to return to care including if symptoms worsen or persist or new concerns arise (particularly fever or shortness of breath)

## 2018-05-30 ENCOUNTER — Ambulatory Visit: Payer: Medicare HMO | Admitting: Physician Assistant

## 2018-05-31 ENCOUNTER — Ambulatory Visit: Payer: Medicare HMO | Admitting: Family Medicine

## 2018-06-19 ENCOUNTER — Telehealth (HOSPITAL_COMMUNITY): Payer: Self-pay | Admitting: *Deleted

## 2018-06-19 NOTE — Telephone Encounter (Signed)
Patient stopped by in clinic and said he was unable to use PAN foundation (PAN closed again).  Unable to afford his xarelto and entresto.  Started application process for [employer] patient assistance and sent message to [employer] who handles Xarelto patient assistance to contact patient.  Samples provided for both, see below.        Medication Samples have been provided to the patient.  Drug name: Delene Loll       Strength: 49-51 mg        Qty: 2  LOT: [id number] Exp.Date: 5/21  Dosing instructions: Take 1 Tablet Twice Daily  The patient has been instructed regarding the correct time, dose, and frequency of taking this medication, including desired effects and most common side effects.   Medication Samples have been provided to the patient.  Drug name: Xarelto       Strength: 20mg         Qty: 4  LOT: [id number]  Exp.Date: 4/21  Dosing instructions: Take 1 Tablet by mouth Daily The patient has been instructed regarding the correct time, dose, and frequency of taking this medication, including desired effects and most common side effects.   Darron Doom 2:36 PM 06/19/2018   Darron Doom 2:35 PM 06/19/2018

## 2018-06-21 NOTE — Progress Notes (Signed)
Subjective:   Carl Wood is a 77 y.o. male who presents for Medicare Annual/Subsequent preventive examination.  Reports health as good BMI 29    Diet Checks bs in the am 150 to 190 in the am  Eating ice cream at hs. Mentioned Yasso bars or Halo or yogurt ice cream  170 this am Recheck in October  Breakfast cereal (shreeded wheat, cheerios and special K)  Lunch sandwich, casserole, goes out 2 meals per day  03/2018 A1c 8.2 recheck in October   Exercise Not able to do so to much  Sometimes he will walk   Tobacco 2014; 50 pack years  Had CT of abd in 2009; no mention of aortic aneurysm  CT ordered by Eric Form and is following   There are no preventive care reminders to display for this patient.  Colonoscopy due 12/11/2022 No Diabetic Retinopathy per his report (seen by Dr. Delman Cheadle)   One shingrix 05/2018  Cardiac Risk Factors include: advanced age (>7men, >50 women);diabetes mellitus;dyslipidemia;family history of premature cardiovascular disease;male gender;hypertension;sedentary lifestyle     Objective:    Vitals: BP (!) 86/48   Pulse 81   Ht 6' (1.829 m)   Wt 214 lb (97.1 kg)   SpO2 92%   BMI 29.02 kg/m   Body mass index is 29.02 kg/m.  Advanced Directives 06/22/2018 03/28/2017 03/28/2017 04/15/2016 01/20/2016 10/20/2015 12/19/2012  Does Patient Have a Medical Advance Directive? No No No No No No Patient does not have advance directive  Would patient like information on creating a medical advance directive? - No - Patient declined No - Patient declined No - patient declined information No - patient declined information No - patient declined information -  Pre-existing out of facility DNR order (yellow form or pink MOST form) - - - - - - No   If something happens to him, refer to wife or dtr;  Wynetta Emery is his dtr and she is aware of his wishes  Tobacco Social History   Tobacco Use  Smoking Status Former Smoker  . Packs/day: 1.00  . Years: 50.00  .  Pack years: 50.00  . Types: Cigarettes  . Last attempt to quit: 12/15/2012  . Years since quitting: 5.5  Smokeless Tobacco Never Used     Counseling given: Yes   Clinical Intake:  Past Medical History:  Diagnosis Date  . CAD (coronary artery disease)   . CHF (congestive heart failure) ([place])   . Chronic atrial fibrillation ([place])   . Colon polyps   . Diabetes mellitus   . Diverticulosis of colon   . DIVERTICULOSIS, COLON 10/16/2006   Qualifier: Diagnosis of  By: Leanne Chang MD, [place]    . Excessive daytime sleepiness 02/19/2016  . Hyperlipidemia   . Hypertension   . MI (mitral incompetence)   . Nephrolithiasis   . NEPHROLITHIASIS 06/26/2008   Qualifier: Diagnosis of  By: Sherlynn Stalls, CMA, [place]    . OSA (obstructive sleep apnea) 02/19/2016   Moderate to severe OSA with an AHI of 25/hr and on CPAP at 8cm H2O  . PE (pulmonary embolism)   . V-tach [facility])    Past Surgical History:  Procedure Laterality Date  . BACK SURGERY    . BASAL CELL CARCINOMA EXCISION     nose  . BIV UPGRADE N/A 03/28/2017   Procedure: BiV Upgrade;  Surgeon: Deboraha Sprang, MD;  Location: [facility];  Service: Cardiovascular;  Laterality: N/A;  . CARDIAC CATHETERIZATION N/A 01/20/2016   Procedure:  Right/Left Heart Cath and Coronary Angiography;  Surgeon: Jolaine Artist, MD;  Location: [facility];  Service: Cardiovascular;  Laterality: N/A;  . CARDIAC DEFIBRILLATOR PLACEMENT     medtronic virtuoso  . CHOLECYSTECTOMY    . COLONOSCOPY  12/11/2012   Procedure: COLONOSCOPY;  Surgeon: Inda Castle, MD;  Location: [facility];  Service: Endoscopy;  Laterality: N/A;  . KNEE ARTHROPLASTY     Family History  Problem Relation Age of Onset  . Colon cancer Father   . Diabetes Father   . Heart disease Father   . Bladder Cancer Mother   . Heart disease Mother   . Heart attack Child 28  . Heart attack Child 10   Social History   Socioeconomic History  . Marital status: Married    Spouse name: [name]   . Number of children: 3  . Years of education: 69  . Highest education level: Not on file  Occupational History  . Occupation: Retired    Fish farm manager: RETIRED  Social Needs  . Financial resource strain: Not on file  . Food insecurity:    Worry: Not on file    Inability: Not on file  . Transportation needs:    Medical: Not on file    Non-medical: Not on file  Tobacco Use  . Smoking status: Former Smoker    Packs/day: 1.00    Years: 50.00    Pack years: 50.00    Types: Cigarettes    Last attempt to quit: 12/15/2012    Years since quitting: 5.5  . Smokeless tobacco: Never Used  Substance and Sexual Activity  . Alcohol use: No    Alcohol/week: 0.0 oz  . Drug use: No  . Sexual activity: Yes    Birth control/protection: None  Lifestyle  . Physical activity:    Days per week: Not on file    Minutes per session: Not on file  . Stress: Not on file  Relationships  . Social connections:    Talks on phone: Not on file    Gets together: Not on file    Attends religious service: Not on file    Active member of club or organization: Not on file    Attends meetings of clubs or organizations: Not on file    Relationship status: Not on file  Other Topics Concern  . Not on file  Social History Narrative   Married. 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 ACCU-CHEK AVIVA PLUS test strip TEST BLOOD SUGAR  DAILY  . atorvastatin (LIPITOR) 80 MG tablet TAKE ONE TABLET BY MOUTH ONCE DAILY AS DIRECTED  . budesonide-formoterol (SYMBICORT) 80-4.5 MCG/ACT inhaler Inhale 2 puffs into the lungs 2 (two) times daily.  . carvedilol (COREG) 12.5 MG tablet Take 0.5 tablets (6.25 mg total) by mouth 2 (two) times daily with a meal.  .  carvedilol (COREG) 6.25 MG tablet Take 1 tablet (6.25 mg total) by mouth 2 (two) times daily with a meal.  . digoxin (LANOXIN) 0.25 MG tablet TAKE ONE-HALF TABLET BY MOUTH ONCE DAILY  . ENTRESTO 49-51 MG TAKE 1 TABLET BY MOUTH TWICE DAILY  . fluticasone (FLONASE) 50 MCG/ACT nasal spray Place 1 spray into both nostrils daily.  Marland Kitchen  furosemide (LASIX) 20 MG tablet TAKE ONE TABLET BY MOUTH ONCE DAILY AS NEEDED FOR  WEIGHT  GAIN  OF  3  POUNDS  IN  24  HOURS  . glimepiride (AMARYL) 4 MG tablet Take 2 tablets (8 mg total) by mouth daily with breakfast.  . metFORMIN (GLUCOPHAGE) 1000 MG tablet Take 1 tablet (1,000 mg total) by mouth 2 (two) times daily with a meal.  . rivaroxaban (XARELTO) 20 MG TABS tablet Take 1 tablet (20 mg total) by mouth daily.  . [DISCONTINUED] doxycycline (VIBRA-TABS) 100 MG tablet Take 1 tablet (100 mg total) by mouth 2 (two) times daily. (Patient not taking: Reported on 06/22/2018)   No facility-administered encounter medications on file as of 06/22/2018.     Activities of Daily Living In your present state of health, do you have any difficulty performing the following activities: 06/22/2018  Hearing? Y  Vision? N  Difficulty concentrating or making decisions? N  Walking or climbing stairs? Y  Dressing or bathing? N  Doing errands, shopping? N  Preparing Food and eating ? N  Using the Toilet? N  In the past six months, have you accidently leaked urine? N  Do you have problems with loss of bowel control? N  Managing your Medications? N  Managing your Finances? N  Housekeeping or managing your Housekeeping? N  Some recent data might be hidden    Patient Care Team: Marin Olp, MD as PCP - General (Family Medicine)   Assessment:   This is a routine wellness examination for [place].  Exercise Activities and Dietary recommendations Current Exercise Habits: Home exercise routine, Intensity: Mild, Exercise limited by: neurologic condition(s);cardiac  condition(s)  Goals    . Patient Stated     To try to maintain your health.       Fall Risk Fall Risk  06/22/2018 01/27/2018 10/26/2017 04/20/2017 04/15/2016  Falls in the past year? No No No No No     Depression Screen PHQ 2/9 Scores 06/22/2018 04/20/2017 04/15/2016 03/18/2016  PHQ - 2 Score 0 1 0 0    Cognitive Function MMSE - Mini Mental State Exam 06/22/2018  Not completed: (No Data)   Ad8 score reviewed for issues:  Issues making decisions:  Less interest in hobbies / activities:  Repeats questions, stories (family complaining):  Trouble using ordinary gadgets (microwave, computer, phone):  Forgets the month or year:   Mismanaging finances:   Remembering appts:  Daily problems with thinking and/or memory: Ad8 score is=0 Wife reports his memory has not faded. Recall 3/3; serial 7's from 100 x 5 was 100% Diagram was correct          Immunization History  Administered Date(s) Administered  . DTaP 09/01/2011  . Influenza Split 09/08/2011  . Influenza Whole 08/23/2008, 09/10/2009, 09/04/2010  . Influenza, High Dose Seasonal PF 09/07/2013, 09/17/2014, 09/10/2016, 08/11/2017  . Influenza-Unspecified 09/03/2015  . Pneumococcal Conjugate-13 11/15/2014  . Pneumococcal Polysaccharide-23 09/08/2011  . Tdap 11/15/2014  . Zoster 02/06/2008  . Zoster Recombinat (Shingrix) 05/09/2018      Screening Tests Health Maintenance  Topic Date Due  . INFLUENZA VACCINE  07/06/2018  . OPHTHALMOLOGY EXAM  07/20/2018  . HEMOGLOBIN A1C  09/22/2018  . FOOT EXAM  05/30/2019  . COLONOSCOPY  12/11/2022  . TETANUS/TDAP  11/15/2024  . PNA vac Low Risk Adult  Completed         Plan:      PCP Notes   Health Maintenance Colonoscopy due 12/11/2022 No Diabetic Retinopathy per  his report (seen by Dr. Delman Cheadle)   One shingrix 05/2018   Abnormal Screens  BP low this am. No symptoms light headedness, chest pain, no sob, no edema  Just took Coreg 6.25 this am. States he was  always taking 6.25, cardiologist just noted this in April.  Wife will check his bp later today and tomorrow in the morning and if it is lower the 90/ call the cardiologist if he he symptomatic of lightheadedness, sob, chest pain, or any other c/o  AAA not noted in 2009 CT; but will consider this screening completed due to smoking hs / OK per Dr. Yong Channel  Referrals  None  Patient concerns; The patient denies depression, but states he does not like to do the things he used to do, as wood working. Energy is low. Discussed mood can be impacted by stroke. His focus is good, judgement intact. Affect a little flat, but does smile etc. Can discuss further with Dr. Yong Channel when they see him in October   Nurse Concerns; As noted   Next PCP apt 09/22/2018       I have personally reviewed and noted the following in the patient's chart:   . Medical and social history . Use of alcohol, tobacco or illicit drugs  . Current medications and supplements . Functional ability and status . Nutritional status . Physical activity . Advanced directives . List of other physicians . Hospitalizations, surgeries, and ER visits in previous 12 months . Vitals . Screenings to include cognitive, depression, and falls . Referrals and appointments  In addition, I have reviewed and discussed with patient certain preventive protocols, quality metrics, and best practice recommendations. A written personalized care plan for preventive services as well as general preventive health recommendations were provided to patient.     Wynetta Fines, RN  06/22/2018

## 2018-06-22 ENCOUNTER — Ambulatory Visit: Payer: Medicare HMO

## 2018-06-22 VITALS — BP 86/48 | HR 81 | Ht 72.0 in | Wt 214.0 lb

## 2018-06-22 DIAGNOSIS — Z0000 Encounter for general adult medical examination without abnormal findings: Secondary | ICD-10-CM

## 2018-06-22 NOTE — Progress Notes (Signed)
I have reviewed and agree with note, evaluation, plan. Thankful for follow up advice on BP- we consulted during patient visit and since he is asymptomatic I was ok with him going home. His BP tends to run low- he is to remain on these medicines to protect his heart as long as asymptomatic. We can chat about his concerns of anhedonia at follow up in October.   [name], MD

## 2018-06-22 NOTE — Patient Instructions (Addendum)
Carl Wood , Thank you for taking time to come for your Medicare Wellness Visit. I appreciate your ongoing commitment to your health goals. Please review the following plan we discussed and let me know if I can assist you in the future.   May try Yasso bars or Halo ice cream. These have protein in them and slow down the sugar burst  [facility] - can assist with hearing aid x 1  No reviews  [place]  [phone]  [phone]  [email or web address]_to_Buy_Hearing_[email or web address]   Had one dose of shingrix and you have to take another one within 6 months   These are the goals we discussed: Goals    . Patient Stated     To try to maintain your health.       This is a list of the screening recommended for you and due dates:  Health Maintenance  Topic Date Due  . Flu Shot  07/06/2018  . Eye exam for diabetics  07/20/2018  . Hemoglobin A1C  09/22/2018  . Complete foot exam   05/30/2019  . Colon Cancer Screening  12/11/2022  . Tetanus Vaccine  11/15/2024  . Pneumonia vaccines  Completed      Diabetes and Foot Care Diabetes may cause you to have problems because of poor blood supply (circulation) to your feet and legs. This may cause the skin on your feet to become thinner, break easier, and heal more slowly. Your skin may become dry, and the skin may peel and crack. You may also have nerve damage in your legs and feet causing decreased feeling in them. You may not notice minor injuries to your feet that could lead to infections or more serious problems. Taking care of your feet is one of the most important things you can do for yourself. Follow these instructions at home:  Wear shoes at all times, even in the house. Do not go barefoot. Bare feet are easily injured.  Check your feet daily for blisters, cuts, and redness. If you cannot see the bottom of your feet, use  a mirror or ask someone for help.  Wash your feet with warm water (do not use hot water) and mild soap. Then pat your feet and the areas between your toes until they are completely dry. Do not soak your feet as this can dry your skin.  Apply a moisturizing lotion or petroleum jelly (that does not contain alcohol and is unscented) to the skin on your feet and to dry, brittle toenails. Do not apply lotion between your toes.  Trim your toenails straight across. Do not dig under them or around the cuticle. File the edges of your nails with an emery board or nail file.  Do not cut corns or calluses or try to remove them with medicine.  Wear clean socks or stockings every day. Make sure they are not too tight. Do not wear knee-high stockings since they may decrease blood flow to your legs.  Wear shoes that fit properly and have enough cushioning. To break in new shoes, wear them for just a few hours a day. This prevents you from injuring your feet. Always look in your shoes before you put them on to be sure there are no objects inside.  Do not cross your legs. This may decrease the blood flow to your feet.  If you find a minor scrape, cut, or break in the skin on your feet, keep it  and the skin around it clean and dry. These areas may be cleansed with mild soap and water. Do not cleanse the area with peroxide, alcohol, or iodine.  When you remove an adhesive bandage, be sure not to damage the skin around it.  If you have a wound, look at it several times a day to make sure it is healing.  Do not use heating pads or hot water bottles. They may burn your skin. If you have lost feeling in your feet or legs, you may not know it is happening until it is too late.  Make sure your health care provider performs a complete foot exam at least annually or more often if you have foot problems. Report any cuts, sores, or bruises to your health care provider immediately. Contact a health care provider  if:  You have an injury that is not healing.  You have cuts or breaks in the skin.  You have an ingrown nail.  You notice redness on your legs or feet.  You feel burning or tingling in your legs or feet.  You have pain or cramps in your legs and feet.  Your legs or feet are numb.  Your feet always feel cold. Get help right away if:  There is increasing redness, swelling, or pain in or around a wound.  There is a red line that goes up your leg.  Pus is coming from a wound.  You develop a fever or as directed by your health care provider.  You notice a bad smell coming from an ulcer or wound. This information is not intended to replace advice given to you by your health care provider. Make sure you discuss any questions you have with your health care provider. Document Released: 11/19/2000 Document Revised: 04/29/2016 Document Reviewed: 05/01/2013 Elsevier Interactive Patient Education  2017 [place] Prevention in the Home Falls can cause injuries. They can happen to people of all ages. There are many things you can do to make your home safe and to help prevent falls. What can I do on the outside of my home?  Regularly fix the edges of walkways and driveways and fix any cracks.  Remove anything that might make you trip as you walk through a door, such as a raised step or threshold.  Trim any bushes or trees on the path to your home.  Use bright outdoor lighting.  Clear any walking paths of anything that might make someone trip, such as rocks or tools.  Regularly check to see if handrails are loose or broken. Make sure that both sides of any steps have handrails.  Any raised decks and porches should have guardrails on the edges.  Have any leaves, snow, or ice cleared regularly.  Use sand or salt on walking paths during winter.  Clean up any spills in your garage right away. This includes oil or grease spills. What can I do in the bathroom?  Use  night lights.  Install grab bars by the toilet and in the tub and shower. Do not use towel bars as grab bars.  Use non-skid mats or decals in the tub or shower.  If you need to sit down in the shower, use a plastic, non-slip stool.  Keep the floor dry. Clean up any water that spills on the floor as soon as it happens.  Remove soap buildup in the tub or shower regularly.  Attach bath mats securely with double-sided non-slip rug tape.  Do not  have throw rugs and other things on the floor that can make you trip. What can I do in the bedroom?  Use night lights.  Make sure that you have a light by your bed that is easy to reach.  Do not use any sheets or blankets that are too big for your bed. They should not hang down onto the floor.  Have a firm chair that has side arms. You can use this for support while you get dressed.  Do not have throw rugs and other things on the floor that can make you trip. What can I do in the kitchen?  Clean up any spills right away.  Avoid walking on wet floors.  Keep items that you use a lot in easy-to-reach places.  If you need to reach something above you, use a strong step stool that has a grab bar.  Keep electrical cords out of the way.  Do not use floor polish or wax that makes floors slippery. If you must use wax, use non-skid floor wax.  Do not have throw rugs and other things on the floor that can make you trip. What can I do with my stairs?  Do not leave any items on the stairs.  Make sure that there are handrails on both sides of the stairs and use them. Fix handrails that are broken or loose. Make sure that handrails are as long as the stairways.  Check any carpeting to make sure that it is firmly attached to the stairs. Fix any carpet that is loose or worn.  Avoid having throw rugs at the top or bottom of the stairs. If you do have throw rugs, attach them to the floor with carpet tape.  Make sure that you have a light switch at  the top of the stairs and the bottom of the stairs. If you do not have them, ask someone to add them for you. What else can I do to help prevent falls?  Wear shoes that: ? Do not have high heels. ? Have rubber bottoms. ? Are comfortable and fit you well. ? Are closed at the toe. Do not wear sandals.  If you use a stepladder: ? Make sure that it is fully opened. Do not climb a closed stepladder. ? Make sure that both sides of the stepladder are locked into place. ? Ask someone to hold it for you, if possible.  Clearly mark and make sure that you can see: ? Any grab bars or handrails. ? First and last steps. ? Where the edge of each step is.  Use tools that help you move around (mobility aids) if they are needed. These include: ? Canes. ? Walkers. ? Scooters. ? Crutches.  Turn on the lights when you go into a dark area. Replace any light bulbs as soon as they burn out.  Set up your furniture so you have a clear path. Avoid moving your furniture around.  If any of your floors are uneven, fix them.  If there are any pets around you, be aware of where they are.  Review your medicines with your doctor. Some medicines can make you feel dizzy. This can increase your chance of falling. Ask your doctor what other things that you can do to help prevent falls. This information is not intended to replace advice given to you by your health care provider. Make sure you discuss any questions you have with your health care provider. Document Released: 09/18/2009 Document Revised: 04/29/2016 Document Reviewed:  12/27/2014 Elsevier Interactive Patient Education  2018 [place] Maintenance, Male A healthy lifestyle and preventive care is important for your health and wellness. Ask your health care provider about what schedule of regular examinations is right for you. What should I know about weight and diet? Eat a Healthy Diet  Eat plenty of vegetables, fruits, whole grains,  low-fat dairy products, and lean protein.  Do not eat a lot of foods high in solid fats, added sugars, or salt.  Maintain a Healthy Weight Regular exercise can help you achieve or maintain a healthy weight. You should:  Do at least 150 minutes of exercise each week. The exercise should increase your heart rate and make you sweat (moderate-intensity exercise).  Do strength-training exercises at least twice a week.  Watch Your Levels of Cholesterol and Blood Lipids  Have your blood tested for lipids and cholesterol every 5 years starting at 75 years of age. If you are at high risk for heart disease, you should start having your blood tested when you are 75 years old. You may need to have your cholesterol levels checked more often if: ? Your lipid or cholesterol levels are high. ? You are older than 75 years of age. ? You are at high risk for heart disease.  What should I know about cancer screening? Many types of cancers can be detected early and may often be prevented. Lung Cancer  You should be screened every year for lung cancer if: ? You are a current smoker who has smoked for at least 30 years. ? You are a former smoker who has quit within the past 15 years.  Talk to your health care provider about your screening options, when you should start screening, and how often you should be screened.  Colorectal Cancer  Routine colorectal cancer screening usually begins at 75 years of age and should be repeated every 5-10 years until you are 75 years old. You may need to be screened more often if early forms of precancerous polyps or small growths are found. Your health care provider may recommend screening at an earlier age if you have risk factors for colon cancer.  Your health care provider may recommend using home test kits to check for hidden blood in the stool.  A small camera at the end of a tube can be used to examine your colon (sigmoidoscopy or colonoscopy). This checks for the  earliest forms of colorectal cancer.  Prostate and Testicular Cancer  Depending on your age and overall health, your health care provider may do certain tests to screen for prostate and testicular cancer.  Talk to your health care provider about any symptoms or concerns you have about testicular or prostate cancer.  Skin Cancer  Check your skin from head to toe regularly.  Tell your health care provider about any new moles or changes in moles, especially if: ? There is a change in a mole's size, shape, or color. ? You have a mole that is larger than a pencil eraser.  Always use sunscreen. Apply sunscreen liberally and repeat throughout the day.  Protect yourself by wearing long sleeves, pants, a wide-brimmed hat, and sunglasses when outside.  What should I know about heart disease, diabetes, and high blood pressure?  If you are 28-58 years of age, have your blood pressure checked every 3-5 years. If you are 33 years of age or older, have your blood pressure checked every year. You should have your blood pressure measured  twice-once when you are at a hospital or clinic, and once when you are not at a hospital or clinic. Record the average of the two measurements. To check your blood pressure when you are not at a hospital or clinic, you can use: ? An automated blood pressure machine at a pharmacy. ? A home blood pressure monitor.  Talk to your health care provider about your target blood pressure.  If you are between 46-75 years old, ask your health care provider if you should take aspirin to prevent heart disease.  Have regular diabetes screenings by checking your fasting blood sugar level. ? If you are at a normal weight and have a low risk for diabetes, have this test once every three years after the age of 57. ? If you are overweight and have a high risk for diabetes, consider being tested at a younger age or more often.  A one-time screening for abdominal aortic aneurysm (AAA)  by ultrasound is recommended for men aged 16-75 years who are current or former smokers. What should I know about preventing infection? Hepatitis B If you have a higher risk for hepatitis B, you should be screened for this virus. Talk with your health care provider to find out if you are at risk for hepatitis B infection. Hepatitis C Blood testing is recommended for:  Everyone born from [birth year] through 1963.  Anyone with known risk factors for hepatitis C.  Sexually Transmitted Diseases (STDs)  You should be screened each year for STDs including gonorrhea and chlamydia if: ? You are sexually active and are younger than 75 years of age. ? You are older than 75 years of age and your health care provider tells you that you are at risk for this type of infection. ? Your sexual activity has changed since you were last screened and you are at an increased risk for chlamydia or gonorrhea. Ask your health care provider if you are at risk.  Talk with your health care provider about whether you are at high risk of being infected with HIV. Your health care provider may recommend a prescription medicine to help prevent HIV infection.  What else can I do?  Schedule regular health, dental, and eye exams.  Stay current with your vaccines (immunizations).  Do not use any tobacco products, such as cigarettes, chewing tobacco, and e-cigarettes. If you need help quitting, ask your health care provider.  Limit alcohol intake to no more than 2 drinks per day. One drink equals 12 ounces of beer, 5 ounces of wine, or 1 ounces of hard liquor.  Do not use street drugs.  Do not share needles.  Ask your health care provider for help if you need support or information about quitting drugs.  Tell your health care provider if you often feel depressed.  Tell your health care provider if you have ever been abused or do not feel safe at home. This information is not intended to replace advice given to you by  your health care provider. Make sure you discuss any questions you have with your health care provider. Document Released: 05/20/2008 Document Revised: 07/21/2016 Document Reviewed: 08/26/2015 Elsevier Interactive Patient Education  [name].

## 2018-06-27 ENCOUNTER — Other Ambulatory Visit: Payer: Self-pay | Admitting: Internal Medicine

## 2018-06-29 ENCOUNTER — Telehealth (HOSPITAL_COMMUNITY): Payer: Self-pay | Admitting: Cardiology

## 2018-06-29 MED ORDER — RIVAROXABAN 20 MG PO TABS: 20.0000 mg | ORAL_TABLET | Freq: Every day | ORAL | 11 refills | Status: DC

## 2018-06-29 NOTE — Telephone Encounter (Signed)
rx printed to accompany patient assistance application  

## 2018-07-03 ENCOUNTER — Other Ambulatory Visit: Payer: Self-pay | Admitting: Family Medicine

## 2018-07-03 ENCOUNTER — Telehealth (HOSPITAL_COMMUNITY): Payer: Self-pay | Admitting: *Deleted

## 2018-07-03 NOTE — Telephone Encounter (Signed)
[facility] Patient Support Program Enrollment form completed and faxed today to [phone].

## 2018-07-06 ENCOUNTER — Encounter: Payer: Self-pay | Admitting: Cardiology

## 2018-07-06 ENCOUNTER — Other Ambulatory Visit: Payer: Self-pay | Admitting: Family Medicine

## 2018-07-06 ENCOUNTER — Ambulatory Visit (INDEPENDENT_AMBULATORY_CARE_PROVIDER_SITE_OTHER): Payer: Medicare HMO | Admitting: *Deleted

## 2018-07-06 DIAGNOSIS — I472 Ventricular tachycardia, unspecified: Secondary | ICD-10-CM

## 2018-07-06 NOTE — Progress Notes (Signed)
Remote ICD transmission.   

## 2018-07-13 ENCOUNTER — Telehealth (HOSPITAL_COMMUNITY): Payer: Self-pay | Admitting: Pharmacist

## 2018-07-13 NOTE — Telephone Encounter (Signed)
[employer] patient assistance approved through 12/05/18.   Ruta Hinds. Velva Harman, PharmD, BCPS, CPP Clinical Pharmacist Phone: [phone] 07/13/2018 11:47 AM

## 2018-07-17 ENCOUNTER — Ambulatory Visit (HOSPITAL_BASED_OUTPATIENT_CLINIC_OR_DEPARTMENT_OTHER)
Admission: RE | Admit: 2018-07-17 | Discharge: 2018-07-17 | Disposition: A | Discharge status: Home / Self Care | Payer: Medicare HMO | Source: Ambulatory Visit | Attending: Internal Medicine | Admitting: Internal Medicine

## 2018-07-17 ENCOUNTER — Ambulatory Visit (HOSPITAL_COMMUNITY)
Admission: RE | Admit: 2018-07-17 | Discharge: 2018-07-17 | Disposition: A | Discharge status: Home / Self Care | Payer: Medicare HMO | Source: Ambulatory Visit | Attending: Internal Medicine | Admitting: Internal Medicine

## 2018-07-17 VITALS — BP 111/61 | HR 83 | Wt 212.8 lb

## 2018-07-17 DIAGNOSIS — I252 Old myocardial infarction: Secondary | ICD-10-CM | POA: Insufficient documentation

## 2018-07-17 DIAGNOSIS — Z86010 Personal history of colonic polyps: Secondary | ICD-10-CM | POA: Insufficient documentation

## 2018-07-17 DIAGNOSIS — E119 Type 2 diabetes mellitus without complications: Secondary | ICD-10-CM | POA: Insufficient documentation

## 2018-07-17 DIAGNOSIS — I219 Acute myocardial infarction, unspecified: Secondary | ICD-10-CM | POA: Diagnosis not present

## 2018-07-17 DIAGNOSIS — I5022 Chronic systolic (congestive) heart failure: Secondary | ICD-10-CM

## 2018-07-17 DIAGNOSIS — Z7901 Long term (current) use of anticoagulants: Secondary | ICD-10-CM | POA: Insufficient documentation

## 2018-07-17 DIAGNOSIS — R9431 Abnormal electrocardiogram [ECG] [EKG]: Secondary | ICD-10-CM | POA: Diagnosis not present

## 2018-07-17 DIAGNOSIS — Z800 Family history of malignant neoplasm of digestive organs: Secondary | ICD-10-CM | POA: Insufficient documentation

## 2018-07-17 DIAGNOSIS — Z880 Allergy status to penicillin: Secondary | ICD-10-CM | POA: Diagnosis not present

## 2018-07-17 DIAGNOSIS — I2510 Atherosclerotic heart disease of native coronary artery without angina pectoris: Secondary | ICD-10-CM | POA: Diagnosis not present

## 2018-07-17 DIAGNOSIS — I255 Ischemic cardiomyopathy: Secondary | ICD-10-CM | POA: Diagnosis not present

## 2018-07-17 DIAGNOSIS — I482 Chronic atrial fibrillation, unspecified: Secondary | ICD-10-CM

## 2018-07-17 DIAGNOSIS — Z79899 Other long term (current) drug therapy: Secondary | ICD-10-CM | POA: Insufficient documentation

## 2018-07-17 DIAGNOSIS — I071 Rheumatic tricuspid insufficiency: Secondary | ICD-10-CM | POA: Insufficient documentation

## 2018-07-17 DIAGNOSIS — E785 Hyperlipidemia, unspecified: Secondary | ICD-10-CM | POA: Insufficient documentation

## 2018-07-17 DIAGNOSIS — I110 Hypertensive heart disease with heart failure: Secondary | ICD-10-CM | POA: Insufficient documentation

## 2018-07-17 DIAGNOSIS — Z87891 Personal history of nicotine dependence: Secondary | ICD-10-CM | POA: Insufficient documentation

## 2018-07-17 DIAGNOSIS — Z95810 Presence of automatic (implantable) cardiac defibrillator: Secondary | ICD-10-CM | POA: Insufficient documentation

## 2018-07-17 DIAGNOSIS — G4733 Obstructive sleep apnea (adult) (pediatric): Secondary | ICD-10-CM | POA: Insufficient documentation

## 2018-07-17 DIAGNOSIS — Z86711 Personal history of pulmonary embolism: Secondary | ICD-10-CM | POA: Insufficient documentation

## 2018-07-17 DIAGNOSIS — Z7984 Long term (current) use of oral hypoglycemic drugs: Secondary | ICD-10-CM | POA: Insufficient documentation

## 2018-07-17 LAB — COMPREHENSIVE METABOLIC PANEL
ALK PHOS: 77 U/L (ref 38–126)
ALT: 18 U/L (ref 0–44)
AST: 18 U/L (ref 15–41)
Albumin: 3.8 g/dL (ref 3.5–5.0)
Anion gap: 11 (ref 5–15)
BILIRUBIN TOTAL: 0.8 mg/dL (ref 0.3–1.2)
BUN: 14 mg/dL (ref 8–23)
CALCIUM: 9.6 mg/dL (ref 8.9–10.3)
CO2: 23 mmol/L (ref 22–32)
Chloride: 105 mmol/L (ref 98–111)
Creatinine, Ser: 0.8 mg/dL (ref 0.61–1.24)
GFR calc Af Amer: >60 mL/min (ref >60)
Glucose, Bld: 192 mg/dL — ABNORMAL HIGH (ref 70–99)
POTASSIUM: 4.2 mmol/L (ref 3.5–5.1)
Sodium: 139 mmol/L (ref 135–145)
TOTAL PROTEIN: 7 g/dL (ref 6.5–8.1)

## 2018-07-17 LAB — DIGOXIN LEVEL

## 2018-07-17 MED ORDER — RIVAROXABAN 20 MG PO TABS: 20.0000 mg | ORAL_TABLET | Freq: Every day | ORAL | 3 refills | Status: DC

## 2018-07-17 MED ORDER — PERFLUTREN LIPID MICROSPHERE
4.0000 mL | INTRAVENOUS | Status: AC | PRN
  Administered 2018-07-17: 4 mL via INTRAVENOUS
  Filled 2018-07-17: qty 4

## 2018-07-17 MED ORDER — SACUBITRIL-VALSARTAN 49-51 MG PO TABS: 1.0000 | ORAL_TABLET | Freq: Two times a day (BID) | ORAL | 11 refills | Status: DC

## 2018-07-17 NOTE — Progress Notes (Signed)
  Echocardiogram 2D Echocardiogram has been performed.  Carl Wood 07/17/2018, 2:30 PM

## 2018-07-17 NOTE — Progress Notes (Signed)
Patient ID: Carl Wood, male   DOB: 1941-02-28, 77 y.o.   MRN: [id number]    Advanced Heart Failure Clinic Note   Referring Physician: Dr Yong Channel  Primary Care: Dr Yong Channel  Primary Cardiologist: EP: Dr Caryl Comes  Neurologists: Dr Leonie Man  HPI: Mr Carl Wood is a 77 year old with a history of MI 1983, ICM, chronic systolic heart failure s/p Medtronic single chamber ICD, CVA 2014, DM , VT, PE, ,htn, permanent A fib on Xarelto, former smoker 2014  referred to the HF clinic by Drs Caryl Comes and [facility] for worsening EF and increasing exercise intolerance.   Had CRT-D upgrade in April 2018.   In 12/18 saw Dr. Tomi Likens for chronic HA. Found to have small SDHs that were felt to be chronic after a previous fall when trimming vines. Xarelto stopped. F/u C 2/19 SDHs resolved. Xarelto restarted in 2/19  Returns today for HF follow up. Says he is feeling pretty good. Able to cut the grass and do the weed-eating. No CP or SOB. No LE edema, orthopnea or PND. Wears CPAP. Over past 2 months has had intermittent epistaxis in left nare. Only complain is that is his legs and back give out on him when he goes up steps. Can go to store without problem. Compliant with meds. No dizziness or low BP.   Echo today EF 25-30%. RV ok. Personally reviewed   ICD interrogated personally in clinic: No VT/VF. Optivol lwas up in July and now down. Activity 2-3 hours/day. CRT100% Personally reviewed   09/2016 Echo EF 25-30%, trace MR and TR.  06/2015 ECHO EF 20% Severely dilated mild TR 12/2012 ECHO EF 30-35%   07/2015: k 4.0 Creatinine 0.86  11/2015: K 4.4, creatinine 0.91    CPX 08/03/17 (bike)  FVC 2.37 (54%)    FEV1 1.47 (44%)     FEV1/FVC 62 (82%)     MVV 65 (50%)     Resting HR: 82 Peak HR: 107  (73% age predicted max HR) BP rest: 90/56 BP peak: 124/66 Peak VO2: 13.0 (62% predicted peak VO2) VE/VCO2 slope: 34 OUES: 1.73 Peak RER: 1.04 Ventilatory Threshold: 10.3 (49% predicted or measured peak  VO2) VE/MVV: 75% O2pulse: 12  (86% predicted O2pulse)   CPX 04/29/17 (TM)  FVC 2.60 (59%)    FEV1 1.54 (46%)     FEV1/FVC 59 (79%)     MVV 64 (48%)     BP rest: 80/58 BP peak: 122/64 Peak VO2: 14.5 (55% predicted peak VO2) VE/VCO2 slope: 31 OUES: 2.05 Peak RER: 1.04 Ventilatory Threshold: 12.2 (47% predicted or measured peak VO2) Peak RR 34 Peak Ventilation: 50.8 VE/MVV: 79% PETCO2 at peak: 34 O2pulse: 14  (88% predicted O2pulse)   RHC.LHC 01/20/2016 RA = 9 RV = 37/3/10 PA = 41/16 (25) PCW = 18 Fick cardiac output/index = 4.5/2.1 Ao = 115/55 (74) LV = 119/8/15 PVR = 1.5 WU SVR = 1141 FA sat = 95% PA sat = 59%, 64% Assessment: 1. Severe 1v CAD with high grade mid LAD lesion (anterior wall felt to be non-viable so treated medically)  2. Ischemic CM with EF 25-30% with akinesis of mid anterior wall and aneurysmal deformity of distal anterior wall and apex 3. Normal filling pressures with moderately reduced cardiac output  CPX 08/13/15 FVC 2.64 (58%)    FEV1 1.66 (46%)     FEV1/FVC 63 (82%)     Resting HR: 65 Peak HR: 113  (76% age predicted max HR) BP rest: 106/60 BP peak: 130/60 Peak  VO2: 14.9 (55.2% predicted peak VO2) VE/VCO2 slope: 27.9 OUES: 2.11 Peak RER: 0.95 Ventilatory Threshold: 11.0 (40.8% predicted or measured peak VO2) Peak RR 28 Peak Ventilation: 41.4 VE/MVV: 55.9% PETCO2 at peak: 37 O2pulse: 12  (71% predicted O2pulse)   Past Medical History:  Diagnosis Date  . CAD (coronary artery disease)   . CHF (congestive heart failure) ([place])   . Chronic atrial fibrillation ([place])   . Colon polyps   . Diabetes mellitus   . Diverticulosis of colon   . DIVERTICULOSIS, COLON 10/16/2006   Qualifier: Diagnosis of  By: Leanne Chang MD, [place]    . Excessive daytime sleepiness 02/19/2016  . Hyperlipidemia   . Hypertension   . MI (mitral incompetence)   . Nephrolithiasis   . NEPHROLITHIASIS 06/26/2008   Qualifier: Diagnosis of   By: Sherlynn Stalls, CMA, [place]    . OSA (obstructive sleep apnea) 02/19/2016   Moderate to severe OSA with an AHI of 25/hr and on CPAP at 8cm H2O  . PE (pulmonary embolism)   . V-tach [facility])     Current Outpatient Medications  Medication Sig Dispense Refill  . ACCU-CHEK AVIVA PLUS test strip TEST BLOOD SUGAR  DAILY 100 each 12  . atorvastatin (LIPITOR) 80 MG tablet TAKE ONE TABLET BY MOUTH ONCE DAILY AS DIRECTED 30 tablet 5  . budesonide-formoterol (SYMBICORT) 80-4.5 MCG/ACT inhaler Inhale 2 puffs into the lungs 2 (two) times daily. 1 Inhaler 2  . carvedilol (COREG) 6.25 MG tablet Take 1 tablet (6.25 mg total) by mouth 2 (two) times daily with a meal. 60 tablet 3  . digoxin (LANOXIN) 0.25 MG tablet TAKE 1/2 (ONE-HALF) TABLET BY MOUTH ONCE DAILY 45 tablet 2  . ENTRESTO 49-51 MG TAKE 1 TABLET BY MOUTH TWICE DAILY 60 tablet 6  . fluticasone (FLONASE) 50 MCG/ACT nasal spray Place 1 spray into both nostrils daily. 16 g 6  . furosemide (LASIX) 20 MG tablet TAKE ONE TABLET BY MOUTH ONCE DAILY AS NEEDED FOR  WEIGHT  GAIN  OF  3  POUNDS  IN  24  HOURS 30 tablet 3  . glimepiride (AMARYL) 4 MG tablet Take 2 tablets (8 mg total) by mouth daily with breakfast. 180 tablet 3  . metFORMIN (GLUCOPHAGE) 1000 MG tablet TAKE 1 TABLET BY MOUTH TWICE DAILY WITH A MEAL. 180 tablet 3  . rivaroxaban (XARELTO) 20 MG TABS tablet Take 1 tablet (20 mg total) by mouth daily. 30 tablet 11   No current facility-administered medications for this encounter.    Facility-Administered Medications Ordered in Other Encounters  Medication Dose Route Frequency Provider Last Rate Last Dose  . perflutren lipid microspheres (DEFINITY) IV suspension  4 mL Intravenous PRN Jackolyn Geron, Shaune Pascal, MD   4 mL at 07/17/18 1428    Allergies  Allergen Reactions  . Penicillins Other (See Comments)    Patient passed out  . Sulfamethoxazole Rash      Social History   Socioeconomic History  . Marital status: Married    Spouse name: Carl Wood  .  Number of children: 3  . Years of education: 68  . Highest education level: Not on file  Occupational History  . Occupation: Retired    Fish farm manager: RETIRED  Social Needs  . Financial resource strain: Not on file  . Food insecurity:    Worry: Not on file    Inability: Not on file  . Transportation needs:    Medical: Not on file    Non-medical: Not on file  Tobacco Use  .  Smoking status: Former Smoker    Packs/day: 1.00    Years: 50.00    Pack years: 50.00    Types: Cigarettes    Last attempt to quit: 12/15/2012    Years since quitting: 5.5  . Smokeless tobacco: Never Used  Substance and Sexual Activity  . Alcohol use: No    Alcohol/week: 0.0 standard drinks  . Drug use: No  . Sexual activity: Yes    Birth control/protection: None  Lifestyle  . Physical activity:    Days per week: Not on file    Minutes per session: Not on file  . Stress: Not on file  Relationships  . Social connections:    Talks on phone: Not on file    Gets together: Not on file    Attends religious service: Not on file    Active member of club or organization: Not on file    Attends meetings of clubs or organizations: Not on file    Relationship status: Not on file  . Intimate partner violence:    Fear of current or ex partner: Not on file    Emotionally abused: Not on file    Physically abused: Not on file    Forced sexual activity: Not on file  Other Topics Concern  . Not on file  Social History Narrative   Married. 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 Colon cancer Father   . Diabetes Father   . Heart disease Father   . Bladder Cancer Mother   . Heart disease Mother   . Heart attack Child 28  . Heart attack Child 47    Vitals:    07/17/18 1429  BP: 111/61  Pulse: 83  SpO2: 97%  Weight: 96.5 kg (212 lb 12.8 oz)    PHYSICAL EXAM: General:  Well appearing. No resp difficulty HEENT: normal Neck: supple. no JVD. Carotids 2+ bilat; no bruits. No lymphadenopathy or thryomegaly appreciated. Cor: PMI laterally displaced. Regular rate & rhythm. No rubs, gallops or murmurs. Lungs: mildly decreased BS throughout. Otherwise clear Abdomen: soft, nontender, nondistended. No hepatosplenomegaly. No bruits or masses. Good bowel sounds. Extremities: no cyanosis, clubbing, rash, edema Neuro: alert & orientedx3, cranial nerves grossly intact. moves all 4 extremities w/o difficulty. Affect pleasant  ECG AF with biv pacing 81 Personally reviewed   ASSESSMENT & PLAN:  1. Chronic Systolic HF: ICM, s/p Medtronic Bi-V ICD. Echo 09/2016 EF 25-30%. Echo today Ef 25-30% - Stable NYHA II-early III at the worst - CPX testing on 5/18pVO2 down 14.5->13.0 but continues to do well clincially - Will continue to follow closely. If he gets worse can consider VAD (RV ok on echo)  - Volume status looks good on exam and Optivol. (Was up in July and now back down). Reinforced need for daily weights and reviewed use of sliding scale diuretics. - ICD interrogation done personally. No VT/VF  100% biv pacing - Continue Coreg to 6.25 mg BID. (previously reduced from 12.5 bid) - Continue Entresto 49/51 mg BID. Was previosuly on full dose but had to cut back.  - No Spiro with history of hyperkalemia and soft BP.  - continue digoxin. Check level today  - Labs today   2. OSA -  Uses CPAP nightly.   3. Former smoker  - Quit in 2014.   4. Chronic A fib - Rate controlled. Xarelto restarted after resolution SDH.   5. Lower extremity fatigue: - No PAD on vascular US in 12/2015  6. CAD: - Has known distal LAD lesion 95% and mid LAD 40%. No benefit to revascularization given wall motion abnormality.  - No signs or symptoms ischemia. Continue statin and  ASA.   7. DM2 - last HgBA1c 8.2 in 4/19 - consider Jardiance. He will d/w Dr. Yong Channel.    Glori Bickers, MD  07/17/2018

## 2018-07-17 NOTE — Patient Instructions (Signed)
Routine lab work today. Will notify you of abnormal results, otherwise no news is good news!  EKG today.  Refills sent to pharmacy.  Follow up 6 months with Dr. Haroldine Laws. We will call you closer to this time, or you may call our office to schedule 1 month before you are due to be seen. Take all medication as prescribed the day of your appointment. Bring all medications with you to your appointment.  Do the following things EVERYDAY: 1) Weigh yourself in the morning before breakfast. Write it down and keep it in a log. 2) Take your medicines as prescribed 3) Eat low salt foods-Limit salt (sodium) to 2000 mg per day.  4) Stay as active as you can everyday 5) Limit all fluids for the day to less than 2 liters

## 2018-07-19 ENCOUNTER — Other Ambulatory Visit: Payer: Self-pay | Admitting: Family Medicine

## 2018-07-19 ENCOUNTER — Ambulatory Visit: Payer: Medicare HMO | Admitting: Family Medicine

## 2018-07-19 ENCOUNTER — Telehealth: Payer: Self-pay | Admitting: Family Medicine

## 2018-07-19 NOTE — Telephone Encounter (Signed)
Patient came into the office to see Dr. Yong Channel for his appointment today which was unfortunately scheduled at [facility]. I explained to the patient that unfortunately it was at the [facility] office and Dr. Yong Channel is out of the office this afternoon. I offered another provider to work him in today however, he refused. He requested to see only Dr. Yong Channel. I explained that at the time I had a 9:30 appointment available for tomorrow, he refused. I worked the patient in for 4pm tomorrow with Dr. Yong Channel.   Patient then asked who he talked to that schedule the appointment and I explained that it was the [facility] that scheduled who works alongside of [place]. Patient stated that he was unhappy with this and I explained that the scheduling may have been done as an error and that we transitioned to the [place] back in the fall and apologized for the inconvenience.   I also have Cassie and Lea reviewing the call to see what happened with the initial appointment that was on for [facility].

## 2018-07-20 ENCOUNTER — Ambulatory Visit (INDEPENDENT_AMBULATORY_CARE_PROVIDER_SITE_OTHER): Payer: Medicare HMO | Admitting: Family Medicine

## 2018-07-20 ENCOUNTER — Encounter: Payer: Self-pay | Admitting: Family Medicine

## 2018-07-20 VITALS — BP 108/62 | HR 82 | Temp 98.4°F | Ht 72.0 in | Wt 212.8 lb

## 2018-07-20 DIAGNOSIS — E119 Type 2 diabetes mellitus without complications: Secondary | ICD-10-CM | POA: Diagnosis not present

## 2018-07-20 DIAGNOSIS — R040 Epistaxis: Secondary | ICD-10-CM

## 2018-07-20 LAB — POCT GLYCOSYLATED HEMOGLOBIN (HGB A1C): HEMOGLOBIN A1C: 7.7 % — AB (ref 4.0–5.6)

## 2018-07-20 LAB — HM DIABETES EYE EXAM

## 2018-07-20 MED ORDER — EMPAGLIFLOZIN 10 MG PO TABS: 10.0000 mg | ORAL_TABLET | Freq: Every day | ORAL | 11 refills | Status: DC

## 2018-07-20 NOTE — Patient Instructions (Addendum)
Health Maintenance Due  Topic Date Due  . OPHTHALMOLOGY EXAM - Patient had it done this morning. Our team will request your eye records. 07/20/2018   . Please check with your pharmacy to see if they have the shingrix vaccine. If they do- please get this immunization and update [place] by phone call or mychart with dates you receive the vaccine.    Lets try nasal saline spray twice a day to see if keeping nasal passages more lubricated will at least reduce the nose bleeds 50-75%. If this does not help within 2-3 weeks, let me know and I can refer you to our ear nose and throat specialists in town.   Team- please check a1c before he goes

## 2018-07-20 NOTE — Progress Notes (Signed)
Subjective:  Carl Wood is a 77 y.o. year old very pleasant male patient who presents for/with See problem oriented charting ROS- patient denies significant cough or congestion. Does have slightly dry nasal passages. Does have left sided nosebleeds on pretty regular basis. No chest pain reported.    Past Medical History-  Patient Active Problem List   Diagnosis Date Noted  . History of subdural hematoma 11/21/2017    Priority: High  . Chronic atrial fibrillation (HCC)     Priority: High  . Chronic systolic heart failure ([place]) 08/06/2015    Priority: High  . Former smoker 11/15/2014    Priority: High  . History of cardioembolic cerebrovascular accident (CVA) 12/19/2012    Priority: High  .  ventricular tachycardia-non sustained  02/17/2012    Priority: High  . Diabetes mellitus type II, controlled ([place]) 10/16/2006    Priority: High  . CAD (coronary artery disease) 10/16/2006    Priority: High  . Chronic pulmonary embolism ([place]) 10/16/2006    Priority: High  . OSA (obstructive sleep apnea) 02/19/2016    Priority: Medium  . Carotid artery stenosis 03/17/2015    Priority: Medium  . Implantable cardioverter-defibrillator (ICD) in situ 02/17/2012    Priority: Medium  . History of skin cancer 07/05/2007    Priority: Medium  . Hyperlipidemia 10/16/2006    Priority: Medium  . Essential hypertension 10/16/2006    Priority: Medium  . LBBB (left bundle branch block) 08/06/2015    Priority: Low  . Actinic keratosis 11/15/2014    Priority: Low  . Benign neoplasm of colon 12/11/2012    Priority: Low  . Frontal headache 10/06/2017  . Rhinitis 08/22/2017  . Dyspnea on exertion 08/22/2017    Medications- reviewed and updated Current Outpatient Medications  Medication Sig Dispense Refill  . ACCU-CHEK AVIVA PLUS test strip TEST BLOOD SUGAR  DAILY 100 each 0  . atorvastatin (LIPITOR) 80 MG tablet TAKE ONE TABLET BY MOUTH ONCE DAILY AS DIRECTED 30 tablet 5  .  budesonide-formoterol (SYMBICORT) 80-4.5 MCG/ACT inhaler Inhale 2 puffs into the lungs 2 (two) times daily. (Patient taking differently: Inhale 2 puffs into the lungs as needed. ) 1 Inhaler 2  . carvedilol (COREG) 6.25 MG tablet Take 1 tablet (6.25 mg total) by mouth 2 (two) times daily with a meal. 60 tablet 3  . digoxin (LANOXIN) 0.25 MG tablet TAKE 1/2 (ONE-HALF) TABLET BY MOUTH ONCE DAILY 45 tablet 2  . fluticasone (FLONASE) 50 MCG/ACT nasal spray Place 1 spray into both nostrils daily. (Patient taking differently: Place 1 spray into both nostrils as needed. ) 16 g 6  . furosemide (LASIX) 20 MG tablet TAKE ONE TABLET BY MOUTH ONCE DAILY AS NEEDED FOR  WEIGHT  GAIN  OF  3  POUNDS  IN  24  HOURS 30 tablet 3  . glimepiride (AMARYL) 4 MG tablet Take 2 tablets (8 mg total) by mouth daily with breakfast. 180 tablet 3  . metFORMIN (GLUCOPHAGE) 1000 MG tablet TAKE 1 TABLET BY MOUTH TWICE DAILY WITH A MEAL. 180 tablet 3  . rivaroxaban (XARELTO) 20 MG TABS tablet Take 1 tablet (20 mg total) by mouth daily. 90 tablet 3  . sacubitril-valsartan (ENTRESTO) 49-51 MG Take 1 tablet by mouth 2 (two) times daily. 60 tablet 11  . empagliflozin (JARDIANCE) 10 MG TABS tablet Take 10 mg by mouth daily. 30 tablet 11   No current facility-administered medications for this visit.     Objective: BP 108/62 (BP Location: Left Arm,  Patient Position: Sitting, Cuff Size: Normal)   Pulse 82   Temp 98.4 F (36.9 C) (Oral)   Ht 6' (1.829 m)   Wt 212 lb 12.8 oz (96.5 kg)   SpO2 96%   BMI 28.86 kg/m  Gen: NAD, resting comfortably Right nasal turbinate dry, left nasal turbinate dry- no obvious recent area of bleeding.  CV: regular rate no rubs or gallops Lungs: CTAB no crackles, wheeze, rhonchi Abdomen: soft/nontender/nondistended/normal bowel sounds. Ext: no edema Skin: warm, dry  Assessment/Plan:  Epistaxis S: for the last few months- can just be sitting three, eating, driving anything and gets bleeding out of  the left nostril- starts running blood. Holds kleenex on inside/with pressure and stops. Big sniff and then feels wad of blood in mouth. Drips on shirts/pants etc. Can happen 2-3x a day or could just be once a day- but happens most days (not last 3 days though).   Started several months after starting xarelto back.   Allergies doing ok since adding allegra in the morning. Sparing flonase - we discussed stopping this completely. Humidifier is running with cpap.  A/P: Patient's nasal passages are very dry. We will try nasal saline twice a day. He was on flonase intermittently and advised to stop that. I cannot visualize area of bleeding. We discussed if doesn't improve- refer to ENT. Has seen  Dr. Lucia Gaskins and was not the best fit per patient.  Dr. Benjamine Mola in [facility] network so we could consider Diabetes mellitus type II, controlled ([place]) S:  controlled reasonably well on metformin 1000mg  BID and glimepiride 8mg  CBGs-  morning blood sugars 150 to 200 in last few weeks.  Up from 130s or 140s prior.  Lab Results  Component Value Date   HGBA1C 7.7 (A) 07/20/2018   HGBA1C 8.2 03/23/2018   HGBA1C 7.0 10/06/2017   A/P:  I think a goal of 8 is reasonable. CBGs trending up so even though a1c reasonable- we will add jardiance (also likely to help with heart failure). Encouraged repeat bmet in 2-3 weeks. Will reduce amaryl back to 4mg  (due to hypoglycemia risks plus potential for pancreatic burnout in regards to insulin production.     Future Appointments  Date Time Provider [place]  08/10/2018  8:30 AM [facility] LAB [facility]  08/30/2018  3:00 PM [facility] 1 [facility]  09/22/2018 11:00 AM Marin Olp, MD [facility]  10/05/2018 10:30 AM [facility] DEVICE REMOTES [facility]  10/23/2018 12:40 PM Sueanne Margarita, MD [facility]  06/25/2019  1:00 PM [facility] HEALTH COACH [facility]   Lab/Order associations: Epistaxis  Controlled type 2 diabetes mellitus  without complication, without long-term current use of insulin ([place]) - Plan: Basic metabolic panel, POCT glycosylated hemoglobin (Hb A1C)  Meds ordered this encounter  Medications  . empagliflozin (JARDIANCE) 10 MG TABS tablet    Sig: Take 10 mg by mouth daily.    Dispense:  30 tablet    Refill:  11    Return precautions advised.  [name], MD

## 2018-07-20 NOTE — Telephone Encounter (Signed)
I have reviewed the [place] call and at no time was [place] mentioned.

## 2018-07-20 NOTE — Telephone Encounter (Signed)
Noted  

## 2018-07-20 NOTE — Assessment & Plan Note (Signed)
S:  controlled reasonably well on metformin 1000mg  BID and glimepiride 8mg  CBGs-  morning blood sugars 150 to 200 in last few weeks.  Up from 130s or 140s prior.  Lab Results  Component Value Date   HGBA1C 7.7 (A) 07/20/2018   HGBA1C 8.2 03/23/2018   HGBA1C 7.0 10/06/2017   A/P:  I think a goal of 8 is reasonable. CBGs trending up so even though a1c reasonable- we will add jardiance (also likely to help with heart failure). Encouraged repeat bmet in 2-3 weeks. Will reduce amaryl back to 4mg  (due to hypoglycemia risks plus potential for pancreatic burnout in regards to insulin production.

## 2018-07-21 ENCOUNTER — Encounter: Payer: Self-pay | Admitting: Family Medicine

## 2018-08-03 LAB — CUP PACEART REMOTE DEVICE CHECK
Brady Statistic AP VP Percent: 0 %
Brady Statistic AP VS Percent: 0 %
Brady Statistic AS VP Percent: 0 %
Brady Statistic AS VS Percent: 0 %
Brady Statistic RV Percent Paced: 96.62 %
HighPow Impedance: 75 Ohm
Implantable Lead Location: 753858
Implantable Lead Model: 6943
Implantable Pulse Generator Implant Date: 20180423
Lead Channel Impedance Value: 246.635
Lead Channel Impedance Value: 250.943
Lead Channel Impedance Value: 361 Ohm
Lead Channel Impedance Value: 475 Ohm
Lead Channel Impedance Value: 513 Ohm
Lead Channel Impedance Value: 513 Ohm
Lead Channel Impedance Value: 532 Ohm
Lead Channel Impedance Value: 836 Ohm
Lead Channel Impedance Value: 893 Ohm
Lead Channel Impedance Value: 931 Ohm
Lead Channel Pacing Threshold Amplitude: 1.375 V
Lead Channel Pacing Threshold Pulse Width: 0.4 ms
Lead Channel Setting Pacing Amplitude: 2.25 V
Lead Channel Setting Pacing Amplitude: 2.75 V
Lead Channel Setting Pacing Pulse Width: 0.4 ms
Lead Channel Setting Pacing Pulse Width: 1 ms
MDC IDC LEAD IMPLANT DT: 20010723
MDC IDC LEAD IMPLANT DT: 20180423
MDC IDC LEAD LOCATION: 753860
MDC IDC MSMT BATTERY REMAINING LONGEVITY: 57 mo
MDC IDC MSMT BATTERY VOLTAGE: 2.97 V
MDC IDC MSMT LEADCHNL LV IMPEDANCE VALUE: 246.635
MDC IDC MSMT LEADCHNL LV IMPEDANCE VALUE: 256.5 Ohm
MDC IDC MSMT LEADCHNL LV IMPEDANCE VALUE: 261.164
MDC IDC MSMT LEADCHNL LV IMPEDANCE VALUE: 874 Ohm
MDC IDC MSMT LEADCHNL LV IMPEDANCE VALUE: 874 Ohm
MDC IDC MSMT LEADCHNL LV IMPEDANCE VALUE: 893 Ohm
MDC IDC MSMT LEADCHNL LV PACING THRESHOLD AMPLITUDE: 1.625 V
MDC IDC MSMT LEADCHNL LV PACING THRESHOLD PULSEWIDTH: 1 ms
MDC IDC MSMT LEADCHNL RA IMPEDANCE VALUE: 4047 Ohm
MDC IDC MSMT LEADCHNL RV IMPEDANCE VALUE: 285 Ohm
MDC IDC MSMT LEADCHNL RV SENSING INTR AMPL: 10.625 mV
MDC IDC MSMT LEADCHNL RV SENSING INTR AMPL: 10.625 mV
MDC IDC SESS DTM: 20190801072204
MDC IDC SET LEADCHNL RV SENSING SENSITIVITY: 0.3 mV
MDC IDC STAT BRADY RA PERCENT PACED: 0 %

## 2018-08-10 ENCOUNTER — Other Ambulatory Visit (INDEPENDENT_AMBULATORY_CARE_PROVIDER_SITE_OTHER): Payer: Medicare HMO

## 2018-08-10 DIAGNOSIS — E119 Type 2 diabetes mellitus without complications: Secondary | ICD-10-CM | POA: Diagnosis not present

## 2018-08-10 LAB — BASIC METABOLIC PANEL
BUN: 19 mg/dL (ref 6–23)
CO2: 27 meq/L (ref 19–32)
Calcium: 9.7 mg/dL (ref 8.4–10.5)
Chloride: 104 mEq/L (ref 96–112)
Creatinine, Ser: 1.02 mg/dL (ref 0.40–1.50)
GFR: 75.69 mL/min (ref >60.00)
GLUCOSE: 144 mg/dL — AB (ref 70–99)
Potassium: 4.7 mEq/L (ref 3.5–5.1)
Sodium: 139 mEq/L (ref 135–145)

## 2018-08-15 ENCOUNTER — Telehealth (HOSPITAL_COMMUNITY): Payer: Self-pay | Admitting: *Deleted

## 2018-08-15 NOTE — Telephone Encounter (Signed)
Patient walked in to request medication samples. Reports he was approved for the entresto (novarttis) patient assistance program and should be getting medication in the mail within the next 7-10 days, however he was denied assistance for the xaretlo (J&J).    Medication Samples have been provided to the patient.  Drug name: Xarelto        Strength: 20 mg        Qty: 28  LOT: [id number]  Exp.Date: 06/21  Dosing instructions: one tab daily in the evening  The patient has been instructed regarding the correct time, dose, and frequency of taking this medication, including desired effects and most common side effects.   Darron Doom 1:08 PM 08/15/2018   Medication Samples have been provided to the patient.  Drug name: entresto       Strength: 49/51        Qty: 28  LOT: [id number]  Exp.Date: 11/2020  Dosing instructions: ONE TAB TWICE A DAY   The patient has been instructed regarding the correct time, dose, and frequency of taking this medication, including desired effects and most common side effects.   Darron Doom 1:09 PM 08/15/2018

## 2018-08-28 ENCOUNTER — Other Ambulatory Visit: Payer: Self-pay | Admitting: Family Medicine

## 2018-08-30 ENCOUNTER — Ambulatory Visit (INDEPENDENT_AMBULATORY_CARE_PROVIDER_SITE_OTHER)
Admission: RE | Admit: 2018-08-30 | Discharge: 2018-08-30 | Disposition: A | Discharge status: Home / Self Care | Payer: Medicare HMO | Source: Ambulatory Visit | Attending: Acute Care | Admitting: Acute Care

## 2018-08-30 DIAGNOSIS — Z87891 Personal history of nicotine dependence: Secondary | ICD-10-CM

## 2018-08-30 DIAGNOSIS — Z122 Encounter for screening for malignant neoplasm of respiratory organs: Secondary | ICD-10-CM

## 2018-09-04 ENCOUNTER — Encounter: Payer: Self-pay | Admitting: Family Medicine

## 2018-09-04 ENCOUNTER — Other Ambulatory Visit: Payer: Self-pay | Admitting: Cardiology

## 2018-09-04 ENCOUNTER — Other Ambulatory Visit: Payer: Self-pay | Admitting: Acute Care

## 2018-09-04 ENCOUNTER — Ambulatory Visit (INDEPENDENT_AMBULATORY_CARE_PROVIDER_SITE_OTHER): Payer: Medicare HMO

## 2018-09-04 DIAGNOSIS — Z23 Encounter for immunization: Secondary | ICD-10-CM

## 2018-09-04 DIAGNOSIS — Z87891 Personal history of nicotine dependence: Secondary | ICD-10-CM

## 2018-09-04 DIAGNOSIS — Z122 Encounter for screening for malignant neoplasm of respiratory organs: Secondary | ICD-10-CM

## 2018-09-05 ENCOUNTER — Encounter: Payer: Self-pay | Admitting: Family Medicine

## 2018-09-05 ENCOUNTER — Ambulatory Visit (INDEPENDENT_AMBULATORY_CARE_PROVIDER_SITE_OTHER): Payer: Medicare HMO

## 2018-09-05 ENCOUNTER — Ambulatory Visit (INDEPENDENT_AMBULATORY_CARE_PROVIDER_SITE_OTHER): Payer: Medicare HMO | Admitting: Family Medicine

## 2018-09-05 VITALS — BP 100/62 | HR 86 | Temp 97.4°F | Ht 72.0 in | Wt 212.8 lb

## 2018-09-05 DIAGNOSIS — M25561 Pain in right knee: Secondary | ICD-10-CM

## 2018-09-05 DIAGNOSIS — M545 Low back pain: Secondary | ICD-10-CM

## 2018-09-05 DIAGNOSIS — G8929 Other chronic pain: Secondary | ICD-10-CM

## 2018-09-05 DIAGNOSIS — M1711 Unilateral primary osteoarthritis, right knee: Secondary | ICD-10-CM | POA: Diagnosis not present

## 2018-09-05 MED ORDER — DICLOFENAC SODIUM 1 % TD GEL: 2.0000 g | Freq: Four times a day (QID) | TRANSDERMAL | 3 refills | Status: DC | PRN

## 2018-09-05 NOTE — Patient Instructions (Signed)
Suspect this is arthritis  Lets get x-rays for more information  Trial voltaren gel IF affordable. If expensive- do not pick this up and instead call me or send a mychart message and I will get you in for a visit with Dr. Paulla Fore for evaluation and to consider injection

## 2018-09-05 NOTE — Progress Notes (Signed)
Subjective:  GURLEY CLIMER is a 77 y.o. year old very pleasant male patient who presents for/with See problem oriented charting ROS- right knee and hip pain noted. No fever, chills. No redness around knee.    Past Medical History-  Patient Active Problem List   Diagnosis Date Noted  . History of subdural hematoma 11/21/2017    Priority: High  . Chronic atrial fibrillation     Priority: High  . Chronic systolic heart failure ([place]) 08/06/2015    Priority: High  . Former smoker 11/15/2014    Priority: High  . History of cardioembolic cerebrovascular accident (CVA) 12/19/2012    Priority: High  .  ventricular tachycardia-non sustained  02/17/2012    Priority: High  . Diabetes mellitus type II, controlled ([place]) 10/16/2006    Priority: High  . CAD (coronary artery disease) 10/16/2006    Priority: High  . Chronic pulmonary embolism ([place]) 10/16/2006    Priority: High  . OSA (obstructive sleep apnea) 02/19/2016    Priority: Medium  . Carotid artery stenosis 03/17/2015    Priority: Medium  . Implantable cardioverter-defibrillator (ICD) in situ 02/17/2012    Priority: Medium  . History of skin cancer 07/05/2007    Priority: Medium  . Hyperlipidemia 10/16/2006    Priority: Medium  . Essential hypertension 10/16/2006    Priority: Medium  . LBBB (left bundle branch block) 08/06/2015    Priority: Low  . Actinic keratosis 11/15/2014    Priority: Low  . Benign neoplasm of colon 12/11/2012    Priority: Low  . Frontal headache 10/06/2017  . Rhinitis 08/22/2017  . Dyspnea on exertion 08/22/2017    Medications- reviewed and updated Current Outpatient Medications  Medication Sig Dispense Refill  . ACCU-CHEK AVIVA PLUS test strip TEST BLOOD SUGAR  DAILY 100 each 0  . atorvastatin (LIPITOR) 80 MG tablet TAKE 1 TABLET BY MOUTH ONCE DAILY AS DIRECTED 90 tablet 2  . budesonide-formoterol (SYMBICORT) 80-4.5 MCG/ACT inhaler Inhale 2 puffs into the lungs 2 (two) times daily. (Patient  taking differently: Inhale 2 puffs into the lungs as needed. ) 1 Inhaler 2  . carvedilol (COREG) 6.25 MG tablet TAKE 1 TABLET BY MOUTH TWICE DAILY WITH A MEAL 60 tablet 11  . digoxin (LANOXIN) 0.25 MG tablet TAKE 1/2 (ONE-HALF) TABLET BY MOUTH ONCE DAILY 45 tablet 2  . empagliflozin (JARDIANCE) 10 MG TABS tablet Take 10 mg by mouth daily. 30 tablet 11  . fluticasone (FLONASE) 50 MCG/ACT nasal spray Place 1 spray into both nostrils daily. (Patient taking differently: Place 1 spray into both nostrils as needed. ) 16 g 6  . furosemide (LASIX) 20 MG tablet TAKE ONE TABLET BY MOUTH ONCE DAILY AS NEEDED FOR  WEIGHT  GAIN  OF  3  POUNDS  IN  24  HOURS 30 tablet 3  . glimepiride (AMARYL) 4 MG tablet Take 2 tablets (8 mg total) by mouth daily with breakfast. 180 tablet 3  . metFORMIN (GLUCOPHAGE) 1000 MG tablet TAKE 1 TABLET BY MOUTH TWICE DAILY WITH A MEAL. 180 tablet 3  . rivaroxaban (XARELTO) 20 MG TABS tablet Take 1 tablet (20 mg total) by mouth daily. 90 tablet 3  . sacubitril-valsartan (ENTRESTO) 49-51 MG Take 1 tablet by mouth 2 (two) times daily. 60 tablet 11  . diclofenac sodium (VOLTAREN) 1 % GEL Apply 2 g topically 4 (four) times daily as needed (right knee or right hip arthritis). 100 g 3   No current facility-administered medications for this visit.  Objective: BP 100/62 (BP Location: Left Arm, Patient Position: Sitting, Cuff Size: Normal)   Pulse 86   Temp (!) 97.4 F (36.3 C) (Oral)   Ht 6' (1.829 m)   Wt 212 lb 12.8 oz (96.5 kg)   SpO2 95%   BMI 28.86 kg/m  Gen: NAD, resting comfortably CV: RRR  Lungs: nonlabored, normal respiratory rate Abdomen: soft/nondistended  Ext: no pretibial edema Skin: warm, dry Neuro: grossly normal, moves all extremities  Right knee- slight effusion.  Tenderness to palpation along lateral joint line, mild tenderness to palpation along medial joint line.  No warmth or erythema.  Ligaments intact.  McMurray's negative.  Walks with cane. Some pain  over SI joint. No pain over greater trochanter.   Assessment/Plan:  Right knee pain S: felt some weakness in knee starting about a week ago. Then he started feeling some pain in lateral knee along lateral joint line- would make him yelp every 10-15 steps. Put on a compression sleeve and noted some relief in pain. Still getting the weakness feeling though. Also having some right hip and back pain- as he babies his right knee these other pains are getting worse. Denies swelling. Ok seated. Issue is with walking.   X-ray from 2012 showed narrowing of medial joint space and patellofemoral joint space. Hip arthritis noted 05/2013 on x-ray of hip.  A/P: Suspect OA -oral nsaids contraindicated for multiple reasons- CAD and on anticoagulant - trial voltaren gel instead if affordable - he does not have orthopedist so will refer to Dr. Paulla Fore if voltaren not affordable - gave option of therapeutic and diagnostic injection today but he wanted to pursue other options first and preferred to see Dr. Paulla Fore for possible injection due to greater volume of injections that he does -a1c is reasonable for steroid injection at 7.7 - suspect as gait normalizes with improving knee pain that hip pain will also improve. Discussed could also try voltaren gel over hip or SI joint  - do not suspect this is sciatica related pain but is in differential   from avs "Suspect this is arthritis  Lets get x-rays for more information  Trial voltaren gel IF affordable. If expensive- do not pick this up and instead call me or send a mychart message and I will get you in for a visit with Dr. Paulla Fore for evaluation and to consider injection"  Future Appointments  Date Time Provider [place]  09/22/2018 11:00 AM Marin Olp, MD [facility]  10/05/2018 10:30 AM [facility] DEVICE REMOTES [facility]  10/23/2018 12:40 PM Sueanne Margarita, MD [facility]  06/25/2019  1:00 PM [facility] HE[facility] CO[facility]  [facility]   Lab/Order associations: Acute pain of right knee - Plan: DG Knee 3 Views Right  Chronic right-sided low back pain without sciatica  Meds ordered this encounter  Medications  . diclofenac sodium (VOLTAREN) 1 % GEL    Sig: Apply 2 g topically 4 (four) times daily as needed (right knee or right hip arthritis).    Dispense:  100 g    Refill:  3    Return precautions advised.  [name], MD

## 2018-09-12 ENCOUNTER — Telehealth (HOSPITAL_COMMUNITY): Payer: Self-pay | Admitting: *Deleted

## 2018-09-12 NOTE — Telephone Encounter (Signed)
Pt came by office to get samples of Xarelto.  Samples provided  Medication Samples have been provided to the patient.  Drug name: Xarelto       Strength: 20mg         Qty: 5   LOT: [id number]  Exp.Date: 6/21  Dosing instructions: Take 1 tab daily  The patient has been instructed regarding the correct time, dose, and frequency of taking this medication, including desired effects and most common side effects.   Carl Wood 1:59 PM 09/12/2018

## 2018-09-22 ENCOUNTER — Encounter: Payer: Self-pay | Admitting: Family Medicine

## 2018-09-22 ENCOUNTER — Ambulatory Visit (INDEPENDENT_AMBULATORY_CARE_PROVIDER_SITE_OTHER): Payer: 59 | Admitting: Family Medicine

## 2018-09-22 VITALS — BP 102/60 | HR 86 | Temp 97.5°F | Ht 72.0 in | Wt 212.8 lb

## 2018-09-22 DIAGNOSIS — E785 Hyperlipidemia, unspecified: Secondary | ICD-10-CM

## 2018-09-22 DIAGNOSIS — Z79899 Other long term (current) drug therapy: Secondary | ICD-10-CM | POA: Diagnosis not present

## 2018-09-22 DIAGNOSIS — I482 Chronic atrial fibrillation, unspecified: Secondary | ICD-10-CM | POA: Diagnosis not present

## 2018-09-22 DIAGNOSIS — E119 Type 2 diabetes mellitus without complications: Secondary | ICD-10-CM | POA: Diagnosis not present

## 2018-09-22 DIAGNOSIS — I10 Essential (primary) hypertension: Secondary | ICD-10-CM | POA: Diagnosis not present

## 2018-09-22 DIAGNOSIS — M1711 Unilateral primary osteoarthritis, right knee: Secondary | ICD-10-CM | POA: Insufficient documentation

## 2018-09-22 LAB — COMPREHENSIVE METABOLIC PANEL
ALT: 17 U/L (ref 0–53)
AST: 15 U/L (ref 0–37)
Albumin: 4.2 g/dL (ref 3.5–5.2)
Alkaline Phosphatase: 69 U/L (ref 39–117)
BUN: 21 mg/dL (ref 6–23)
CHLORIDE: 101 meq/L (ref 96–112)
CO2: 29 meq/L (ref 19–32)
CREATININE: 1.06 mg/dL (ref 0.40–1.50)
Calcium: 9.8 mg/dL (ref 8.4–10.5)
GFR: 72.38 mL/min (ref >60.00)
Glucose, Bld: 365 mg/dL — ABNORMAL HIGH (ref 70–99)
POTASSIUM: 4.8 meq/L (ref 3.5–5.1)
SODIUM: 138 meq/L (ref 135–145)
Total Bilirubin: 0.5 mg/dL (ref 0.2–1.2)
Total Protein: 7.3 g/dL (ref 6.0–8.3)

## 2018-09-22 LAB — LIPID PANEL
CHOL/HDL RATIO: 4
CHOLESTEROL: 134 mg/dL (ref 0–200)
HDL: 33.5 mg/dL — ABNORMAL LOW (ref >39.00)
LDL Cholesterol: 76 mg/dL (ref 0–99)
NonHDL: 100.6
TRIGLYCERIDES: 122 mg/dL (ref 0.0–149.0)
VLDL: 24.4 mg/dL (ref 0.0–40.0)

## 2018-09-22 LAB — VITAMIN B12: Vitamin B-12: 208 pg/mL — ABNORMAL LOW (ref 211–911)

## 2018-09-22 LAB — CBC
HEMATOCRIT: 46 % (ref 39.0–52.0)
Hemoglobin: 15.4 g/dL (ref 13.0–17.0)
MCHC: 33.5 g/dL (ref 30.0–36.0)
MCV: 97.9 fl (ref 78.0–100.0)
Platelets: 166 10*3/uL (ref 150.0–400.0)
RBC: 4.7 Mil/uL (ref 4.22–5.81)
RDW: 14.1 % (ref 11.5–15.5)
WBC: 5.1 10*3/uL (ref 4.0–10.5)

## 2018-09-22 NOTE — Assessment & Plan Note (Signed)
S:  controlled on atorvastatin 80 mg  A/P: Well-controlled in the past-need to update lipids

## 2018-09-22 NOTE — Assessment & Plan Note (Addendum)
S: Reasonably controlled on metformin 1 g twice daily, Amaryl 8 mg, Jardiance 10 mg Lab Results  Component Value Date   HGBA1C 7.7 (A) 07/20/2018   HGBA1C 8.2 03/23/2018   HGBA1C 7.0 10/06/2017   A/P: update a1c next visit- hopefully stable

## 2018-09-22 NOTE — Assessment & Plan Note (Signed)
S: Carvedilol for rate control.  Xarelto 20 mg for anticoagulation.  He is also on digoxin A/P: Stable-continue current medications and cardiology follow-up.

## 2018-09-22 NOTE — Progress Notes (Signed)
Subjective:  Carl Wood is a 77 y.o. year old very pleasant male patient who presents for/with See problem oriented charting ROS- some right knee pain. No chest pain. Stable shortness of breath- walking up hill or steps but ok on flat surfaces. No edema.    Past Medical History-  Patient Active Problem List   Diagnosis Date Noted  . History of subdural hematoma 11/21/2017    Priority: High  . Chronic atrial fibrillation     Priority: High  . Chronic systolic heart failure ([place]) 08/06/2015    Priority: High  . Former smoker 11/15/2014    Priority: High  . History of cardioembolic cerebrovascular accident (CVA) 12/19/2012    Priority: High  .  ventricular tachycardia-non sustained  02/17/2012    Priority: High  . Diabetes mellitus type II, controlled ([place]) 10/16/2006    Priority: High  . CAD (coronary artery disease) 10/16/2006    Priority: High  . Chronic pulmonary embolism ([place]) 10/16/2006    Priority: High  . OSA (obstructive sleep apnea) 02/19/2016    Priority: Medium  . Carotid artery stenosis 03/17/2015    Priority: Medium  . Implantable cardioverter-defibrillator (ICD) in situ 02/17/2012    Priority: Medium  . History of skin cancer 07/05/2007    Priority: Medium  . Hyperlipidemia 10/16/2006    Priority: Medium  . Essential hypertension 10/16/2006    Priority: Medium  . LBBB (left bundle branch block) 08/06/2015    Priority: Low  . Actinic keratosis 11/15/2014    Priority: Low  . Benign neoplasm of colon 12/11/2012    Priority: Low  . Osteoarthritis of right knee 09/22/2018  . Frontal headache 10/06/2017  . Rhinitis 08/22/2017  . Dyspnea on exertion 08/22/2017    Medications- reviewed and updated Current Outpatient Medications  Medication Sig Dispense Refill  . ACCU-CHEK AVIVA PLUS test strip TEST BLOOD SUGAR  DAILY 100 each 0  . atorvastatin (LIPITOR) 80 MG tablet TAKE 1 TABLET BY MOUTH ONCE DAILY AS DIRECTED 90 tablet 2  . budesonide-formoterol  (SYMBICORT) 80-4.5 MCG/ACT inhaler Inhale 2 puffs into the lungs 2 (two) times daily. (Patient taking differently: Inhale 2 puffs into the lungs as needed. ) 1 Inhaler 2  . carvedilol (COREG) 6.25 MG tablet TAKE 1 TABLET BY MOUTH TWICE DAILY WITH A MEAL 60 tablet 11  . digoxin (LANOXIN) 0.25 MG tablet TAKE 1/2 (ONE-HALF) TABLET BY MOUTH ONCE DAILY 45 tablet 2  . empagliflozin (JARDIANCE) 10 MG TABS tablet Take 10 mg by mouth daily. 30 tablet 11  . fluticasone (FLONASE) 50 MCG/ACT nasal spray Place 1 spray into both nostrils daily. (Patient taking differently: Place 1 spray into both nostrils as needed. ) 16 g 6  . furosemide (LASIX) 20 MG tablet TAKE ONE TABLET BY MOUTH ONCE DAILY AS NEEDED FOR  WEIGHT  GAIN  OF  3  POUNDS  IN  24  HOURS 30 tablet 3  . glimepiride (AMARYL) 4 MG tablet Take 2 tablets (8 mg total) by mouth daily with breakfast. 180 tablet 3  . metFORMIN (GLUCOPHAGE) 1000 MG tablet TAKE 1 TABLET BY MOUTH TWICE DAILY WITH A MEAL. 180 tablet 3  . rivaroxaban (XARELTO) 20 MG TABS tablet Take 1 tablet (20 mg total) by mouth daily. 90 tablet 3  . sacubitril-valsartan (ENTRESTO) 49-51 MG Take 1 tablet by mouth 2 (two) times daily. 60 tablet 11   No current facility-administered medications for this visit.     Objective: BP 102/60   Pulse 86  Temp (!) 97.5 F (36.4 C) (Oral)   Ht 6' (1.829 m)   Wt 212 lb 12.8 oz (96.5 kg)   SpO2 94%   BMI 28.86 kg/m  Gen: NAD, resting comfortably CV: Regular rate but irregularly irregular no murmurs rubs or gallops Lungs: CTAB no crackles, wheeze, rhonchi Abdomen: soft/nontender/nondistended/normal bowel sounds. No rebound or guarding.  Ext: no edema Skin: warm, dry Neuro:speech normal, moves all extremities Right knee soft knee brace noted  Assessment/Plan:  Other notes: 1.  Wart on right index finger resolved with cryotherapy in April then recently came back- not painful right now- can use cryotherapy again if  needed  Hyperlipidemia S:  controlled on atorvastatin 80 mg  A/P: Well-controlled in the past-need to update lipids  Chronic atrial fibrillation ([place]) S: Carvedilol for rate control.  Xarelto 20 mg for anticoagulation.  He is also on digoxin A/P: Stable-continue current medications and cardiology follow-up.  Osteoarthritis of right knee S: Patient seen for right knee pain about 2 weeks ago-advised trial of Voltaren gel if affordable.  The alternate option was to see Dr. Paulla Fore if not affordable to consider knee injection.  Oral NSAIDs contraindicated with Xarelto. voltaren gel is helping but is not perfect.  A/P: much improved- continue current medicine- can refer to Dr. Paulla Fore if needed- ok to do this through mychart  Diabetes mellitus type II, controlled ([place]) S: Reasonably controlled on metformin 1 g twice daily, Amaryl 8 mg, Jardiance 10 mg Lab Results  Component Value Date   HGBA1C 7.7 (A) 07/20/2018   HGBA1C 8.2 03/23/2018   HGBA1C 7.0 10/06/2017   A/P: update a1c next visit- hopefully stable  Essential hypertension S: controlled on Entresto, Lasix as needed, carvedilol 6.25 mg twice daily- most of these medications are for cardiac indications-from blood pressure along perspective would reduce meds but he needs these for cardiac reasons BP Readings from Last 3 Encounters:  09/22/18 (!) 90/58  09/05/18 100/62  07/20/18 108/62  A/P: We discussed blood pressure goal of <140/90-he is far below this but he needs his current medications.  Stable-continue current meds  Future Appointments  Date Time Provider [place]  10/05/2018 10:30 AM [facility]OT[facility]  12/28/2018  3:00 PM Sueanne Margarita, MD [facility]  01/02/2019  3:30 PM Deboraha Sprang, MD [facility]  06/25/2019  1:00 PM [facility]ALTH CO[facility]   Return in about 3 months (around 12/23/2018) for physical.  Lab/Order associations: NOT  fasting Controlled type 2 diabetes mellitus without complication, without long-term current use of insulin ([place]) - Plan: CBC, Comprehensive metabolic panel, Lipid panel, Vitamin B12, CANCELED: Hemoglobin A1c  Hyperlipidemia, unspecified hyperlipidemia type  Chronic atrial fibrillation  Primary osteoarthritis of right knee  High risk medication use - Plan: Vitamin B12  Essential hypertension  Return precautions advised.  [name], MD

## 2018-09-22 NOTE — Assessment & Plan Note (Signed)
S: Patient seen for right knee pain about 2 weeks ago-advised trial of Voltaren gel if affordable.  The alternate option was to see Dr. Paulla Fore if not affordable to consider knee injection.  Oral NSAIDs contraindicated with Xarelto. voltaren gel is helping but is not perfect.  A/P: much improved- continue current medicine- can refer to Dr. Paulla Fore if needed- ok to do this through [employer]

## 2018-09-22 NOTE — Patient Instructions (Addendum)
Please stop by lab before you go  can refer to Dr. Paulla Fore if needed- ok to do this through [employer]

## 2018-09-22 NOTE — Assessment & Plan Note (Signed)
S: controlled on Entresto, Lasix as needed, carvedilol 6.25 mg twice daily- most of these medications are for cardiac indications-from blood pressure along perspective would reduce meds but he needs these for cardiac reasons BP Readings from Last 3 Encounters:  09/22/18 (!) 90/58  09/05/18 100/62  07/20/18 108/62  A/P: We discussed blood pressure goal of <140/90-he is far below this but he needs his current medications.  Stable-continue current meds

## 2018-09-26 ENCOUNTER — Other Ambulatory Visit: Payer: Self-pay

## 2018-09-26 DIAGNOSIS — R7989 Other specified abnormal findings of blood chemistry: Secondary | ICD-10-CM

## 2018-09-26 DIAGNOSIS — E538 Deficiency of other specified B group vitamins: Secondary | ICD-10-CM

## 2018-10-04 ENCOUNTER — Encounter: Payer: Self-pay | Admitting: Surgical

## 2018-10-04 ENCOUNTER — Ambulatory Visit (INDEPENDENT_AMBULATORY_CARE_PROVIDER_SITE_OTHER): Payer: Medicare HMO | Admitting: Surgical

## 2018-10-04 DIAGNOSIS — E538 Deficiency of other specified B group vitamins: Secondary | ICD-10-CM | POA: Diagnosis not present

## 2018-10-04 MED ORDER — CYANOCOBALAMIN 1000 MCG/ML IJ SOLN
1000.0000 ug | Freq: Once | INTRAMUSCULAR | Status: AC
  Administered 2018-10-04: 1000 ug via INTRAMUSCULAR

## 2018-10-04 NOTE — Progress Notes (Signed)
Patient comes in today for his first B 12 injection. B 12 given in left deltoid. Patient tolerated well. Patient has the next three B 12 injections scheduled. He had no questions today.

## 2018-10-05 ENCOUNTER — Ambulatory Visit (INDEPENDENT_AMBULATORY_CARE_PROVIDER_SITE_OTHER): Payer: Medicare HMO | Admitting: *Deleted

## 2018-10-05 DIAGNOSIS — I5022 Chronic systolic (congestive) heart failure: Secondary | ICD-10-CM

## 2018-10-05 DIAGNOSIS — I472 Ventricular tachycardia, unspecified: Secondary | ICD-10-CM

## 2018-10-05 NOTE — Progress Notes (Signed)
Remote ICD transmission.   

## 2018-10-09 DIAGNOSIS — D485 Neoplasm of uncertain behavior of skin: Secondary | ICD-10-CM | POA: Diagnosis not present

## 2018-10-09 DIAGNOSIS — L570 Actinic keratosis: Secondary | ICD-10-CM | POA: Diagnosis not present

## 2018-10-09 DIAGNOSIS — C4442 Squamous cell carcinoma of skin of scalp and neck: Secondary | ICD-10-CM | POA: Diagnosis not present

## 2018-10-09 DIAGNOSIS — L819 Disorder of pigmentation, unspecified: Secondary | ICD-10-CM | POA: Diagnosis not present

## 2018-10-10 DIAGNOSIS — C4442 Squamous cell carcinoma of skin of scalp and neck: Secondary | ICD-10-CM | POA: Diagnosis not present

## 2018-10-11 ENCOUNTER — Ambulatory Visit (INDEPENDENT_AMBULATORY_CARE_PROVIDER_SITE_OTHER): Payer: Medicare HMO

## 2018-10-11 DIAGNOSIS — E538 Deficiency of other specified B group vitamins: Secondary | ICD-10-CM | POA: Diagnosis not present

## 2018-10-11 MED ORDER — CYANOCOBALAMIN 1000 MCG/ML IJ SOLN
1000.0000 ug | Freq: Once | INTRAMUSCULAR | Status: AC
  Administered 2018-10-11: 1000 ug via INTRAMUSCULAR

## 2018-10-11 NOTE — Progress Notes (Signed)
Per orders of Dr. Yong Channel, injection of B-12 given by Francella Solian in right deltoid. Patient tolerated injection well. Will make next appointment on way out.

## 2018-10-15 ENCOUNTER — Encounter: Payer: Self-pay | Admitting: Cardiology

## 2018-10-18 ENCOUNTER — Ambulatory Visit (INDEPENDENT_AMBULATORY_CARE_PROVIDER_SITE_OTHER): Payer: Medicare HMO

## 2018-10-18 DIAGNOSIS — E538 Deficiency of other specified B group vitamins: Secondary | ICD-10-CM

## 2018-10-18 MED ORDER — CYANOCOBALAMIN 1000 MCG/ML IJ SOLN
1000.0000 ug | Freq: Once | INTRAMUSCULAR | Status: AC
  Administered 2018-10-18: 1000 ug via INTRAMUSCULAR

## 2018-10-18 NOTE — Progress Notes (Signed)
Cyanocobalamin 1000 mcg/mL, 1 mL given IM, left deltoid, mfg: [employer], lot3: [id number], exp: [id number], ndc: 0517-0031-01, pt tolerated well.

## 2018-10-23 ENCOUNTER — Ambulatory Visit: Payer: Medicare HMO | Admitting: Cardiology

## 2018-10-23 ENCOUNTER — Other Ambulatory Visit (HOSPITAL_COMMUNITY): Payer: Self-pay

## 2018-10-23 MED ORDER — RIVAROXABAN 20 MG PO TABS: 20.0000 mg | ORAL_TABLET | Freq: Every day | ORAL | 0 refills | Status: DC

## 2018-10-23 NOTE — Telephone Encounter (Signed)
Samples for Xarelto 20mg  4 bottles (28 tabs) given to patient.   Lot Number [id number] exp 6/21

## 2018-10-25 ENCOUNTER — Ambulatory Visit (INDEPENDENT_AMBULATORY_CARE_PROVIDER_SITE_OTHER): Payer: Medicare HMO

## 2018-10-25 DIAGNOSIS — E538 Deficiency of other specified B group vitamins: Secondary | ICD-10-CM | POA: Diagnosis not present

## 2018-10-25 MED ORDER — CYANOCOBALAMIN 1000 MCG/ML IJ SOLN
1000.0000 ug | Freq: Once | INTRAMUSCULAR | Status: AC
  Administered 2018-10-25: 1000 ug via INTRAMUSCULAR

## 2018-10-25 NOTE — Patient Instructions (Signed)
There are no preventive care reminders to display for this patient.  Depression screen [facility] 2/9 06/22/2018 04/20/2017 04/15/2016  Decreased Interest 0 0 0  Down, Depressed, Hopeless 0 1 0  PHQ - 2 Score 0 1 0  Some recent data might be hidden

## 2018-10-25 NOTE — Progress Notes (Signed)
Patient here today for B12 injection. Administered in right arm. Patient tolerated well

## 2018-10-26 ENCOUNTER — Other Ambulatory Visit: Payer: Self-pay | Admitting: Family Medicine

## 2018-10-26 MED ORDER — GLUCOSE BLOOD VI STRP: ORAL_STRIP | 5 refills | Status: DC

## 2018-10-26 NOTE — Telephone Encounter (Signed)
Copied from [place] [id number]. Topic: Quick Communication - Rx Refill/Question >> Oct 26, 2018  3:31 PM Selinda Flavin B, [place] wrote: Medication: ACCU-CHEK AVIVA PLUS test strip  Has the patient contacted their pharmacy? Yes.  Gave him the run around. Would like sent to different pharmacy (Agent: If no, request that the patient contact the pharmacy for the refill.) (Agent: If yes, when and what did the pharmacy advise?)  Preferred Pharmacy (with phone number or street name): [place], [place] - [address]: Please be advised that RX refills may take up to 3 business days. We ask that you follow-up with your pharmacy.

## 2018-11-04 ENCOUNTER — Other Ambulatory Visit (HOSPITAL_COMMUNITY): Payer: Self-pay | Admitting: Internal Medicine

## 2018-11-14 ENCOUNTER — Emergency Department (HOSPITAL_COMMUNITY): Payer: Medicare HMO

## 2018-11-14 ENCOUNTER — Ambulatory Visit (INDEPENDENT_AMBULATORY_CARE_PROVIDER_SITE_OTHER): Payer: Medicare HMO | Admitting: Physician Assistant

## 2018-11-14 ENCOUNTER — Emergency Department (HOSPITAL_COMMUNITY)
Admission: EM | Admit: 2018-11-14 | Discharge: 2018-11-14 | Discharge status: Against Medical Advice | Payer: Medicare HMO | Attending: Emergency Medicine | Admitting: Emergency Medicine

## 2018-11-14 ENCOUNTER — Encounter (HOSPITAL_COMMUNITY): Payer: Self-pay | Admitting: *Deleted

## 2018-11-14 ENCOUNTER — Encounter: Payer: Self-pay | Admitting: Physician Assistant

## 2018-11-14 VITALS — BP 90/56 | HR 95 | Temp 98.5°F | Ht 72.0 in | Wt 209.2 lb

## 2018-11-14 DIAGNOSIS — J209 Acute bronchitis, unspecified: Secondary | ICD-10-CM | POA: Diagnosis not present

## 2018-11-14 DIAGNOSIS — R059 Cough, unspecified: Secondary | ICD-10-CM

## 2018-11-14 DIAGNOSIS — Z5321 Procedure and treatment not carried out due to patient leaving prior to being seen by health care provider: Secondary | ICD-10-CM | POA: Diagnosis not present

## 2018-11-14 DIAGNOSIS — R05 Cough: Secondary | ICD-10-CM | POA: Insufficient documentation

## 2018-11-14 DIAGNOSIS — E860 Dehydration: Secondary | ICD-10-CM

## 2018-11-14 DIAGNOSIS — I959 Hypotension, unspecified: Secondary | ICD-10-CM

## 2018-11-14 DIAGNOSIS — J029 Acute pharyngitis, unspecified: Secondary | ICD-10-CM | POA: Diagnosis not present

## 2018-11-14 DIAGNOSIS — R0789 Other chest pain: Secondary | ICD-10-CM | POA: Diagnosis not present

## 2018-11-14 DIAGNOSIS — R079 Chest pain, unspecified: Secondary | ICD-10-CM | POA: Diagnosis not present

## 2018-11-14 DIAGNOSIS — R918 Other nonspecific abnormal finding of lung field: Secondary | ICD-10-CM | POA: Diagnosis not present

## 2018-11-14 LAB — BASIC METABOLIC PANEL
Anion gap: 11 (ref 5–15)
BUN: 13 mg/dL (ref 8–23)
CO2: 27 mmol/L (ref 22–32)
CREATININE: 0.94 mg/dL (ref 0.61–1.24)
Calcium: 9.3 mg/dL (ref 8.9–10.3)
Chloride: 104 mmol/L (ref 98–111)
GFR calc Af Amer: >60 mL/min (ref >60)
Glucose, Bld: 192 mg/dL — ABNORMAL HIGH (ref 70–99)
Potassium: 4.2 mmol/L (ref 3.5–5.1)
Sodium: 142 mmol/L (ref 135–145)

## 2018-11-14 LAB — POC INFLUENZA A&B (BINAX/QUICKVUE)
INFLUENZA A, POC: NEGATIVE
INFLUENZA B, POC: NEGATIVE

## 2018-11-14 LAB — CBC
HCT: 51 % (ref 39.0–52.0)
Hemoglobin: 16.4 g/dL (ref 13.0–17.0)
MCH: 32.3 pg (ref 26.0–34.0)
MCHC: 32.2 g/dL (ref 30.0–36.0)
MCV: 100.4 fL — ABNORMAL HIGH (ref 80.0–100.0)
Platelets: 166 10*3/uL (ref 150–400)
RBC: 5.08 MIL/uL (ref 4.22–5.81)
RDW: 13.5 % (ref 11.5–15.5)
WBC: 9.7 10*3/uL (ref 4.0–10.5)
nRBC: 0 % (ref 0.0–0.2)

## 2018-11-14 LAB — POCT RAPID STREP A (OFFICE): Rapid Strep A Screen: NEGATIVE

## 2018-11-14 LAB — POCT I-STAT TROPONIN I: TROPONIN I, POC: 0.01 ng/mL (ref 0.00–0.08)

## 2018-11-14 NOTE — ED Notes (Signed)
Pt called for room and no answer x1

## 2018-11-14 NOTE — ED Notes (Signed)
Pt called for room and no answer x2

## 2018-11-14 NOTE — Patient Instructions (Signed)
After discussion with Dr. Juleen China, I think you would be best served going to the ER. I think you will IV fluids and possible IV antibiotics. Due to your complicated medical history, if we don't treat aggressively, you may get worse.

## 2018-11-14 NOTE — Progress Notes (Signed)
Carl Wood is a 77 y.o. male here for a new problem.  I acted as a Education administrator for [employer], PA-C Anselmo Pickler, LPN  History of Present Illness:   Chief Complaint  Patient presents with  . Cough   Patient has a significant past medical history of chronic A. fib, chronic PE, type 2 diabetes, OSA.  Cough  This is a new problem. Episode onset: Started on Sunday. The problem has been gradually worsening. The problem occurs constantly. The cough is productive of sputum (clear/green sputum). Associated symptoms include headaches, nasal congestion, postnasal drip, a sore throat and wheezing. Pertinent negatives include no chills, fever or shortness of breath. Associated symptoms comments: Nausea, vomited this AM. The symptoms are aggravated by lying down. He has tried rest (Corcidin, Tylenol) for the symptoms. The treatment provided no relief. His past medical history is significant for bronchitis. There is no history of asthma.   He states that he is significantly more weak than normal.  Cough is productive.  He also states that he is not drinking much water, and when he does try to drink water he vomits.  Vitals:   11/14/18 1023  BP: (!) 90/56  Pulse: 95  Temp: 98.5 F (36.9 C)  SpO2: 93%     Past Medical History:  Diagnosis Date  . CAD (coronary artery disease)   . CHF (congestive heart failure) ([place])   . Chronic atrial fibrillation   . Colon polyps   . Diabetes mellitus   . Diverticulosis of colon   . DIVERTICULOSIS, COLON 10/16/2006   Qualifier: Diagnosis of  By: Leanne Chang MD, Bruce    . Excessive daytime sleepiness 02/19/2016  . Hyperlipidemia   . Hypertension   . MI (mitral incompetence)   . Nephrolithiasis   . NEPHROLITHIASIS 06/26/2008   Qualifier: Diagnosis of  By: Sherlynn Stalls, CMA, [place]    . OSA (obstructive sleep apnea) 02/19/2016   Moderate to severe OSA with an AHI of 25/hr and on CPAP at 8cm H2O  . PE (pulmonary embolism)   . V-tach [facility])      Social  History   Socioeconomic History  . Marital status: Married    Spouse name: [name]  . Number of children: 3  . Years of education: 69  . Highest education level: Not on file  Occupational History  . Occupation: Retired    Fish farm manager: RETIRED  Social Needs  . Financial resource strain: Not on file  . Food insecurity:    Worry: Not on file    Inability: Not on file  . Transportation needs:    Medical: Not on file    Non-medical: Not on file  Tobacco Use  . Smoking status: Former Smoker    Packs/day: 1.00    Years: 50.00    Pack years: 50.00    Types: Cigarettes    Last attempt to quit: 12/15/2012    Years since quitting: 5.9  . Smokeless tobacco: Never Used  Substance and Sexual Activity  . Alcohol use: No    Alcohol/week: 0.0 standard drinks  . Drug use: No  . Sexual activity: Yes    Birth control/protection: None  Lifestyle  . Physical activity:    Days per week: Not on file    Minutes per session: Not on file  . Stress: Not on file  Relationships  . Social connections:    Talks on phone: Not on file    Gets together: Not on file    Attends religious service: Not  on file    Active member of club or organization: Not on file    Attends meetings of clubs or organizations: Not on file    Relationship status: Not on file  . Intimate partner violence:    Fear of current or ex partner: Not on file    Emotionally abused: Not on file    Physically abused: Not on file    Forced sexual activity: Not on file  Other Topics Concern  . Not on file  Social History Narrative   Married. 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 BACK SURGERY    . BASAL CELL CARCINOMA EXCISION     nose  . BIV UPGRADE N/A 03/28/2017   Procedure:  BiV Upgrade;  Surgeon: Deboraha Sprang, MD;  Location: [place] [facility];  Service: Cardiovascular;  Laterality: N/A;  . CARDIAC CATHETERIZATION N/A 01/20/2016   Procedure: Right/Left Heart Cath and Coronary Angiography;  Surgeon: Jolaine Artist, MD;  Location: [place] [facility];  Service: Cardiovascular;  Laterality: N/A;  . CARDIAC DEFIBRILLATOR PLACEMENT     medtronic virtuoso  . CHOLECYSTECTOMY    . COLONOSCOPY  12/11/2012   Procedure: COLONOSCOPY;  Surgeon: Inda Castle, MD;  Location: [facility];  Service: Endoscopy;  Laterality: N/A;  . KNEE ARTHROPLASTY      Family History  Problem Relation Age of Onset  . Colon cancer Father   . Diabetes Father   . Heart disease Father   . Bladder Cancer Mother   . Heart disease Mother   . Heart attack Child 28  . Heart attack Child 47    Allergies  Allergen Reactions  . Penicillins Other (See Comments)    Patient passed out  . Sulfamethoxazole Rash    Current Medications:   Current Outpatient Medications:  .  atorvastatin (LIPITOR) 80 MG tablet, TAKE 1 TABLET BY MOUTH ONCE DAILY AS DIRECTED, Disp: 90 tablet, Rfl: 2 .  budesonide-formoterol (SYMBICORT) 80-4.5 MCG/ACT inhaler, Inhale 2 puffs into the lungs 2 (two) times daily. (Patient taking differently: Inhale 2 puffs into the lungs as needed. ), Disp: 1 Inhaler, Rfl: 2 .  carvedilol (COREG) 6.25 MG tablet, TAKE 1 TABLET BY MOUTH TWICE DAILY WITH A MEAL, Disp: 60 tablet, Rfl: 11 .  digoxin (LANOXIN) 0.25 MG tablet, TAKE 1/2 (ONE-HALF) TABLET BY MOUTH ONCE DAILY, Disp: 45 tablet, Rfl: 2 .  empagliflozin (JARDIANCE) 10 MG TABS tablet, Take 10 mg by mouth daily., Disp: 30 tablet, Rfl: 11 .  fluticasone (FLONASE) 50 MCG/ACT nasal spray, Place 1 spray into both nostrils daily. (Patient taking differently: Place 1 spray into both nostrils as needed. ), Disp: 16 g, Rfl: 6 .  furosemide (LASIX) 20 MG tablet, TAKE ONE TABLET BY MOUTH ONCE DAILY AS NEEDED FOR  WEIGHT  GAIN  OF  3  POUNDS   IN  24  HOURS, Disp: 30 tablet, Rfl: 3 .  furosemide (LASIX) 20 MG tablet, TAKE 1 TABLET BY MOUTH ONCE A WEEK, Disp: 12 tablet, Rfl: 4 .  glimepiride (AMARYL) 4 MG tablet, Take 2 tablets (8 mg total) by mouth daily with breakfast., Disp: 180 tablet, Rfl: 3 .  glucose blood (ACCU-CHEK AVIVA PLUS) test strip, TEST BLOOD SUGAR  DAILY,  Disp: 100 each, Rfl: 5 .  metFORMIN (GLUCOPHAGE) 1000 MG tablet, TAKE 1 TABLET BY MOUTH TWICE DAILY WITH A MEAL., Disp: 180 tablet, Rfl: 3 .  rivaroxaban (XARELTO) 20 MG TABS tablet, Take 1 tablet (20 mg total) by mouth daily., Disp: 28 tablet, Rfl: 0 .  sacubitril-valsartan (ENTRESTO) 49-51 MG, Take 1 tablet by mouth 2 (two) times daily., Disp: 60 tablet, Rfl: 11   Review of Systems:   Review of Systems  Constitutional: Negative for chills and fever.  HENT: Positive for postnasal drip and sore throat.   Respiratory: Positive for cough and wheezing. Negative for shortness of breath.   Neurological: Positive for headaches.    Vitals:   Vitals:   11/14/18 1023  BP: (!) 90/56  Pulse: 95  Temp: 98.5 F (36.9 C)  TempSrc: Oral  SpO2: 93%  Weight: 209 lb 4 oz (94.9 kg)  Height: 6' (1.829 m)     Body mass index is 28.38 kg/m.  Physical Exam:   Physical Exam  Constitutional: He appears well-developed. He is cooperative.  Non-toxic appearance. He does not have a sickly appearance. He does not appear ill. No distress.  HENT:  Head: Normocephalic and atraumatic.  Right Ear: Tympanic membrane, external ear and ear canal normal. Tympanic membrane is not erythematous, not retracted and not bulging.  Left Ear: External ear and ear canal normal.  Nose: Mucosal edema and rhinorrhea present. Right sinus exhibits no maxillary sinus tenderness and no frontal sinus tenderness. Left sinus exhibits no maxillary sinus tenderness and no frontal sinus tenderness.  Mouth/Throat: Uvula is midline. Posterior oropharyngeal erythema present. No posterior oropharyngeal edema.  Tonsils are 2+ on the right. Tonsils are 2+ on the left. No tonsillar exudate.  L ear with cerumen impaction -- unable to visualize TM  Eyes: Conjunctivae and lids are normal.  Neck: Trachea normal.  Cardiovascular: Normal rate, regular rhythm, S1 normal, S2 normal and normal heart sounds.  Pulmonary/Chest: Effort normal. He has decreased breath sounds. He has no wheezes. He has rhonchi in the right lower field. He has no rales.  Lymphadenopathy:    He has no cervical adenopathy.  Neurological: He is alert.  Skin: Skin is warm, dry and intact.  Psychiatric: He has a normal mood and affect. His speech is normal and behavior is normal.  Nursing note and vitals reviewed.  Results for orders placed or performed in visit on 11/14/18  POC Influenza A&B(BINAX/QUICKVUE)  Result Value Ref Range   Influenza A, POC Negative Negative   Influenza B, POC Negative Negative  POCT rapid strep A  Result Value Ref Range   Rapid Strep A Screen Negative Negative    Assessment and Plan:   Theadore was seen today for cough.  Diagnoses and all orders for this visit:  Cough and Sore throat Rapid flu and strep test negative. Vitals unstable.  Given complex past medical history and current symptoms as well as possible need for more acute intervention, recommended patient will go to ED for further evaluation and treatment.  Patient and wife were agreeable to plan. -     POC Influenza A&B(BINAX/QUICKVUE) -     POCT rapid strep A   . Reviewed expectations re: course of current medical issues. . Discussed self-management of symptoms. . Outlined signs and symptoms indicating need for more acute intervention. . Patient verbalized understanding and all questions were answered. . See orders for this visit as documented in the electronic medical record. . Patient received an After-Visit Summary.  CMA or LPN served as scribe during this visit. History, Physical, and Plan performed by medical provider. The above  documentation has been reviewed and is accurate and complete.  Inda Coke, PA-C

## 2018-11-14 NOTE — ED Triage Notes (Signed)
Pt complains of sore throat, productive cough and chest pain x 2 days. Pt sent to [facility] and was sent to ED for further evaluation.

## 2018-11-14 NOTE — ED Notes (Signed)
Blue save tube in main lab

## 2018-11-14 NOTE — ED Notes (Signed)
Pt called for room x3 no answer

## 2018-11-16 ENCOUNTER — Telehealth (HOSPITAL_COMMUNITY): Payer: Self-pay

## 2018-11-16 NOTE — Telephone Encounter (Signed)
Samples of xarelto 20mg  given to pt in the donut hole. 3 bottles. Lot number [id number]. Expiration 8/21

## 2018-11-24 ENCOUNTER — Telehealth: Payer: Self-pay | Admitting: Physician Assistant

## 2018-11-24 NOTE — Telephone Encounter (Signed)
I called and introduced myself spoke to Carl Wood, the patient's spouse, and advised "that I was made aware of a concern they had at a recent visit to the office and was calling on behalf of the office and my supervisor." She "appreciated my call and briefed me on the summary of events" (listed below). I "apologized on the office's behalf for how that visit went and how it made them feel as that was not our intentions." Carl Wood stated that "she just wished that the provider would have come in to tell them rather than a nurse telling them to go to the emergency room." I explained that "I too spoke with Carl Wood and she mentioned that she apologized and that she wanted the patient to receive the information as soon as possible and that was by the nurse coming to explain." Carl Wood stated "she understood and appreciated that however, just would have preferred the provider told them to go to the emergency room." She stated "she appreciated my call and was pleased that they were called to close the loop on the situation." No further action required from our office as the situation as been resolved.     Copied from [place] [id number]. Topic: Complaint - Provider (sensitive) >> Nov 17, 2018  9:28 AM Carl Wood wrote: Date of Incident: 12/10 Details of complaint:   Patient's wife, Carl Wood called and stated that he is a patient of Carl Wood and on Tuesday she made him an appointment with Carl Wood due to [facility] not being available. She said that he was checked for the flu and strept at his visit. She said that [facility] had the nurse do all of this and never came back into the room after this. She said that the nurse came back to let the patient and his wife know that Carl Wood advised that they go to the emergency room. She said she is very unhappy about this as she felt that Carl Wood should have came and spoke with them to let them know her concerns and as to why they were being sent there. The nurse advised them that  he might need a IV with antibiotic. They did go to [facility] and they did an ekg/xray and checked his vitals along with blood work. Patient's wife states that they sat him in the waiting room for about 3-4 hours and finally they just left because of the long wait and never seen the doctor. They went to an urgent care in [place] and they diagnosed him with bronchitis. She did not think he needed to go to the emergency room to begin with and doesn't understand why Carl Wood did not just order an xray at the office. He is currently on antibiotic and hopefully on the road to being better. She would like to speak with the Engineer, building services.   Route to Engineer, building services. >> Nov 23, 2018  1:05 PM Carl Wood wrote: Can you please contact this patient and do some research and service recovery. I don't think there needs to be a safety zone portal which is attached to this CRM. Please apologize for the delay. This was sent to me as a CRM on 11/17/18. I will be happy to call her next week if needed after your call.

## 2018-12-05 ENCOUNTER — Ambulatory Visit (INDEPENDENT_AMBULATORY_CARE_PROVIDER_SITE_OTHER): Payer: Medicare HMO | Admitting: Family Medicine

## 2018-12-05 ENCOUNTER — Encounter: Payer: Self-pay | Admitting: Family Medicine

## 2018-12-05 VITALS — BP 108/58 | HR 92 | Temp 98.4°F | Ht 72.0 in | Wt 204.0 lb

## 2018-12-05 DIAGNOSIS — J329 Chronic sinusitis, unspecified: Secondary | ICD-10-CM

## 2018-12-05 DIAGNOSIS — B9689 Other specified bacterial agents as the cause of diseases classified elsewhere: Secondary | ICD-10-CM

## 2018-12-05 LAB — CUP PACEART REMOTE DEVICE CHECK
Battery Voltage: 2.97 V
Brady Statistic AP VS Percent: 0 %
Brady Statistic AS VP Percent: 0 %
Brady Statistic AS VS Percent: 0 %
Brady Statistic RA Percent Paced: 0 %
Brady Statistic RV Percent Paced: 97.11 %
HighPow Impedance: 78 Ohm
Implantable Lead Implant Date: 20010723
Implantable Lead Implant Date: 20180423
Implantable Lead Location: 753858
Implantable Lead Model: 6943
Implantable Pulse Generator Implant Date: 20180423
Lead Channel Impedance Value: 1026 Ohm
Lead Channel Impedance Value: 1026 Ohm
Lead Channel Impedance Value: 261.164
Lead Channel Impedance Value: 265.661
Lead Channel Impedance Value: 283.733
Lead Channel Impedance Value: 289.049
Lead Channel Impedance Value: 304 Ohm
Lead Channel Impedance Value: 418 Ohm
Lead Channel Impedance Value: 513 Ohm
Lead Channel Impedance Value: 532 Ohm
Lead Channel Impedance Value: 551 Ohm
Lead Channel Impedance Value: 608 Ohm
Lead Channel Impedance Value: 931 Ohm
Lead Channel Impedance Value: 950 Ohm
Lead Channel Impedance Value: 950 Ohm
Lead Channel Pacing Threshold Amplitude: 1.5 V
Lead Channel Pacing Threshold Amplitude: 1.75 V
Lead Channel Pacing Threshold Pulse Width: 0.4 ms
Lead Channel Pacing Threshold Pulse Width: 1 ms
Lead Channel Sensing Intrinsic Amplitude: 12.25 mV
Lead Channel Sensing Intrinsic Amplitude: 12.25 mV
Lead Channel Setting Pacing Amplitude: 2.25 V
Lead Channel Setting Pacing Amplitude: 3 V
Lead Channel Setting Pacing Pulse Width: 0.4 ms
Lead Channel Setting Pacing Pulse Width: 1 ms
Lead Channel Setting Sensing Sensitivity: 0.3 mV
MDC IDC LEAD LOCATION: 753860
MDC IDC MSMT BATTERY REMAINING LONGEVITY: 54 mo
MDC IDC MSMT LEADCHNL LV IMPEDANCE VALUE: 1007 Ohm
MDC IDC MSMT LEADCHNL LV IMPEDANCE VALUE: 270.667
MDC IDC MSMT LEADCHNL RA IMPEDANCE VALUE: 4047 Ohm
MDC IDC SESS DTM: 20191031041804
MDC IDC STAT BRADY AP VP PERCENT: 0 %

## 2018-12-05 MED ORDER — DOXYCYCLINE HYCLATE 100 MG PO TABS: 100.0000 mg | ORAL_TABLET | Freq: Two times a day (BID) | ORAL | 0 refills | Status: DC

## 2018-12-05 NOTE — Patient Instructions (Signed)
Sinusitis/sinus infection Bacterial based on: Symptoms >10 days. With your chest congestion and wheeze strongly considered prednisone but lets give this a few more days since you had prednisone earlier this month to see if the doxycycline can knock this back first. If not improved within 48- 72 hours, lets call in the prednisone though. If not better after course of prednisone and doxycycline lets check back in- would likely get another chest x-ray  Finally, we reviewed reasons to return to care including if symptoms worsen or persist or new concerns arise (particularly fever or shortness of breath)  Meds ordered this encounter  Medications  . doxycycline (VIBRA-TABS) 100 MG tablet    Sig: Take 1 tablet (100 mg total) by mouth 2 (two) times daily.    Dispense:  20 tablet    Refill:  0

## 2018-12-05 NOTE — Progress Notes (Signed)
PCP: Marin Olp, MD  Subjective:  Carl Wood is a 77 y.o. year old very pleasant male patient who presents with sinusitis symptoms including nasal congestion, sinus tenderness  Patient was seen in clinic 3-4 weeks ago seen for cough/sore throat. PNA or sepsis was the concern given his low blood pressure at the time.  He was advised of concern for dehydration-advised to go to the emergency room.  He went to the emergency room but was discouraged by 4-hour wait and not feeling further closer to being seen.  He did have a chest x-ray done in the emergency room which showed no obvious pneumonia.  I-STAT troponin, be met, CBC, flu and strep test were all reassuring as well- patient ultimately decided to leave the emergency room.  He proceeded to go to an urgent care clinic in [place]- was told bronchitis. antibiotics and steroid and some cough pills were given. Felt better next day.   Once he finished the treatments-Felt better about 50% improvement but never went away then in last few days worsened again. Cough has worsened- gets to gagging with cough and may throw up. Right side teeth hurt.  Continues with sinus pressure.  Does feel like he has some mild chest congestion and wheeze-per wife he always seems to have some mild wheeze though.  He denies any shortness of breath above baseline.  -sick contacts/travel/risks: denies flu exposure.   ROS-denies fever, SOB above baseline, NVD.does have right upper dental pain  Pertinent Past Medical History-  Patient Active Problem List   Diagnosis Date Noted  . History of subdural hematoma 11/21/2017    Priority: High  . Chronic atrial fibrillation     Priority: High  . Chronic systolic heart failure ([place]) 08/06/2015    Priority: High  . Former smoker 11/15/2014    Priority: High  . History of cardioembolic cerebrovascular accident (CVA) 12/19/2012    Priority: High  .  ventricular tachycardia-non sustained  02/17/2012    Priority:  High  . Diabetes mellitus type II, controlled ([place]) 10/16/2006    Priority: High  . CAD (coronary artery disease) 10/16/2006    Priority: High  . Chronic pulmonary embolism ([place]) 10/16/2006    Priority: High  . OSA (obstructive sleep apnea) 02/19/2016    Priority: Medium  . Carotid artery stenosis 03/17/2015    Priority: Medium  . Implantable cardioverter-defibrillator (ICD) in situ 02/17/2012    Priority: Medium  . History of skin cancer 07/05/2007    Priority: Medium  . Hyperlipidemia 10/16/2006    Priority: Medium  . Essential hypertension 10/16/2006    Priority: Medium  . LBBB (left bundle branch block) 08/06/2015    Priority: Low  . Actinic keratosis 11/15/2014    Priority: Low  . Benign neoplasm of colon 12/11/2012    Priority: Low  . Osteoarthritis of right knee 09/22/2018  . Frontal headache 10/06/2017  . Rhinitis 08/22/2017  . Dyspnea on exertion 08/22/2017    Medications- reviewed  Current Outpatient Medications  Medication Sig Dispense Refill  . atorvastatin (LIPITOR) 80 MG tablet TAKE 1 TABLET BY MOUTH ONCE DAILY AS DIRECTED 90 tablet 2  . budesonide-formoterol (SYMBICORT) 80-4.5 MCG/ACT inhaler Inhale 2 puffs into the lungs 2 (two) times daily. (Patient taking differently: Inhale 2 puffs into the lungs as needed. ) 1 Inhaler 2  . carvedilol (COREG) 6.25 MG tablet TAKE 1 TABLET BY MOUTH TWICE DAILY WITH A MEAL 60 tablet 11  . digoxin (LANOXIN) 0.25 MG tablet TAKE 1/2 (ONE-HALF)  TABLET BY MOUTH ONCE DAILY 45 tablet 2  . empagliflozin (JARDIANCE) 10 MG TABS tablet Take 10 mg by mouth daily. 30 tablet 11  . fluticasone (FLONASE) 50 MCG/ACT nasal spray Place 1 spray into both nostrils daily. (Patient taking differently: Place 1 spray into both nostrils as needed. ) 16 g 6  . furosemide (LASIX) 20 MG tablet TAKE ONE TABLET BY MOUTH ONCE DAILY AS NEEDED FOR  WEIGHT  GAIN  OF  3  POUNDS  IN  24  HOURS 30 tablet 3  . furosemide (LASIX) 20 MG tablet TAKE 1 TABLET BY MOUTH  ONCE A WEEK 12 tablet 4  . glimepiride (AMARYL) 4 MG tablet Take 2 tablets (8 mg total) by mouth daily with breakfast. 180 tablet 3  . glucose blood (ACCU-CHEK AVIVA PLUS) test strip TEST BLOOD SUGAR  DAILY 100 each 5  . metFORMIN (GLUCOPHAGE) 1000 MG tablet TAKE 1 TABLET BY MOUTH TWICE DAILY WITH A MEAL. 180 tablet 3  . rivaroxaban (XARELTO) 20 MG TABS tablet Take 1 tablet (20 mg total) by mouth daily. 28 tablet 0  . sacubitril-valsartan (ENTRESTO) 49-51 MG Take 1 tablet by mouth 2 (two) times daily. 60 tablet 11  . doxycycline (VIBRA-TABS) 100 MG tablet Take 1 tablet (100 mg total) by mouth 2 (two) times daily. 20 tablet 0   No current facility-administered medications for this visit.     Objective: BP (!) 108/58 (BP Location: Left Arm, Patient Position: Sitting, Cuff Size: Large)   Pulse 92   Temp 98.4 F (36.9 C) (Oral)   Ht 6' (1.829 m)   Wt 204 lb (92.5 kg)   SpO2 94%   BMI 27.67 kg/m  Gen: NAD, resting comfortably HEENT: Turbinates erythematous with yellow drainage- some dried blood also noted, TM normal, pharynx mildly erythematous with no tonsilar exudate or edema, right maxillary sinus tenderness CV: irregularly irregular no murmurs rubs or gallops Lungs: CTAB no crackles, wheeze, rhonchi Ext: no edema Skin: warm, dry, no rash  Assessment/Plan:  Sinusitis/sinus infection Bacterial based on: Symptoms >10 days. With your chest congestion and wheeze strongly considered prednisone but lets give this a few more days since you had prednisone earlier this month to see if the doxycycline can knock this back first. If not improved within 48- 72 hours, lets call in the prednisone though. If not better after course of prednisone and doxycycline lets check back in- would likely get another chest x-ray. He remains on symbicort prescribed by pulmonary on 08/22/17 due to restrictive lung disease.   Finally, we reviewed reasons to return to care including if symptoms worsen or persist or  new concerns arise (particularly fever or shortness of breath)  Meds ordered this encounter  Medications  . doxycycline (VIBRA-TABS) 100 MG tablet    Sig: Take 1 tablet (100 mg total) by mouth 2 (two) times daily.    Dispense:  20 tablet    Refill:  0    [name], MD

## 2018-12-22 ENCOUNTER — Telehealth (HOSPITAL_COMMUNITY): Payer: Self-pay | Admitting: Licensed Clinical Social Worker

## 2018-12-22 NOTE — Telephone Encounter (Signed)
CSW received notification from [employer] that pt had been approved for patient assistance through 12/06/19.  CSW called pt and informed of approval  CSW will continue to follow and assist as needed  Jorge Ny, [place] Worker [facility] [phone]

## 2018-12-26 ENCOUNTER — Ambulatory Visit (INDEPENDENT_AMBULATORY_CARE_PROVIDER_SITE_OTHER): Payer: Medicare HMO | Admitting: Family Medicine

## 2018-12-26 ENCOUNTER — Other Ambulatory Visit: Payer: Medicare HMO

## 2018-12-26 ENCOUNTER — Encounter: Payer: Self-pay | Admitting: Family Medicine

## 2018-12-26 VITALS — BP 84/56 | HR 85 | Temp 97.5°F | Ht 72.0 in | Wt 207.0 lb

## 2018-12-26 DIAGNOSIS — J449 Chronic obstructive pulmonary disease, unspecified: Secondary | ICD-10-CM | POA: Diagnosis not present

## 2018-12-26 DIAGNOSIS — Z6828 Body mass index (BMI) 28.0-28.9, adult: Secondary | ICD-10-CM

## 2018-12-26 DIAGNOSIS — Z0000 Encounter for general adult medical examination without abnormal findings: Secondary | ICD-10-CM | POA: Diagnosis not present

## 2018-12-26 DIAGNOSIS — I5022 Chronic systolic (congestive) heart failure: Secondary | ICD-10-CM

## 2018-12-26 DIAGNOSIS — I482 Chronic atrial fibrillation, unspecified: Secondary | ICD-10-CM | POA: Diagnosis not present

## 2018-12-26 DIAGNOSIS — E119 Type 2 diabetes mellitus without complications: Secondary | ICD-10-CM | POA: Diagnosis not present

## 2018-12-26 DIAGNOSIS — E538 Deficiency of other specified B group vitamins: Secondary | ICD-10-CM

## 2018-12-26 DIAGNOSIS — I2510 Atherosclerotic heart disease of native coronary artery without angina pectoris: Secondary | ICD-10-CM | POA: Diagnosis not present

## 2018-12-26 DIAGNOSIS — I2782 Chronic pulmonary embolism: Secondary | ICD-10-CM | POA: Diagnosis not present

## 2018-12-26 LAB — COMPREHENSIVE METABOLIC PANEL
ALT: 12 U/L (ref 0–53)
AST: 14 U/L (ref 0–37)
Albumin: 4.2 g/dL (ref 3.5–5.2)
Alkaline Phosphatase: 65 U/L (ref 39–117)
BUN: 16 mg/dL (ref 6–23)
CHLORIDE: 104 meq/L (ref 96–112)
CO2: 30 mEq/L (ref 19–32)
Calcium: 10.2 mg/dL (ref 8.4–10.5)
Creatinine, Ser: 0.87 mg/dL (ref 0.40–1.50)
GFR: 85.47 mL/min (ref >60.00)
Glucose, Bld: 160 mg/dL — ABNORMAL HIGH (ref 70–99)
POTASSIUM: 4.7 meq/L (ref 3.5–5.1)
Sodium: 142 mEq/L (ref 135–145)
Total Bilirubin: 0.6 mg/dL (ref 0.2–1.2)
Total Protein: 6.9 g/dL (ref 6.0–8.3)

## 2018-12-26 LAB — CBC
HEMATOCRIT: 45.4 % (ref 39.0–52.0)
HEMOGLOBIN: 15.2 g/dL (ref 13.0–17.0)
MCHC: 33.5 g/dL (ref 30.0–36.0)
MCV: 96.6 fl (ref 78.0–100.0)
Platelets: 194 10*3/uL (ref 150.0–400.0)
RBC: 4.7 Mil/uL (ref 4.22–5.81)
RDW: 14.2 % (ref 11.5–15.5)
WBC: 5.1 10*3/uL (ref 4.0–10.5)

## 2018-12-26 LAB — HEMOGLOBIN A1C: Hgb A1c MFr Bld: 8.3 % — ABNORMAL HIGH (ref 4.6–6.5)

## 2018-12-26 LAB — LDL CHOLESTEROL, DIRECT: Direct LDL: 79 mg/dL

## 2018-12-26 NOTE — Progress Notes (Signed)
Annie Main,    Thanks for reaching out to me and advocating for your patient. I see the reminder letter we mailed him a year ago - he appears to have been one of [facility] former patients assigned to me.    Considering all the issues you outlined, I agree that the risks of routine colonoscopy appear to outweigh the expected benefits.   I would be glad to meet with him if he prefers to discuss in person.  Take care,  - Mallie Mussel

## 2018-12-26 NOTE — Progress Notes (Signed)
Phone: [phone]  Subjective:  Patient presents today for their annual physical. Chief complaint-noted.   See problem oriented charting- ROS- full  review of systems was completed and negative except for:  Nosebleeds, postnasal drip, runny nose, sinus pressure, palpitations, neck stiffness  The following were reviewed and entered/updated in epic: Past Medical History:  Diagnosis Date  . CAD (coronary artery disease)   . CHF (congestive heart failure) ([place])   . Chronic atrial fibrillation   . Colon polyps   . Diabetes mellitus   . Diverticulosis of colon   . DIVERTICULOSIS, COLON 10/16/2006   Qualifier: Diagnosis of  By: Leanne Chang MD, [place]    . Excessive daytime sleepiness 02/19/2016  . Hyperlipidemia   . Hypertension   . MI (mitral incompetence)   . Nephrolithiasis   . NEPHROLITHIASIS 06/26/2008   Qualifier: Diagnosis of  By: Sherlynn Stalls, CMA, [place]    . OSA (obstructive sleep apnea) 02/19/2016   Moderate to severe OSA with an AHI of 25/hr and on CPAP at 8cm H2O  . PE (pulmonary embolism)   . V-tach [facility])    Patient Active Problem List   Diagnosis Date Noted  . History of subdural hematoma 11/21/2017    Priority: High  . Chronic atrial fibrillation     Priority: High  . Chronic systolic heart failure ([place]) 08/06/2015    Priority: High  . Former smoker 11/15/2014    Priority: High  . History of cardioembolic cerebrovascular accident (CVA) 12/19/2012    Priority: High  .  ventricular tachycardia-non sustained  02/17/2012    Priority: High  . Diabetes mellitus type II, controlled ([place]) 10/16/2006    Priority: High  . CAD (coronary artery disease) 10/16/2006    Priority: High  . Chronic pulmonary embolism ([place]) 10/16/2006    Priority: High  . OSA (obstructive sleep apnea) 02/19/2016    Priority: Medium  . Carotid artery stenosis 03/17/2015    Priority: Medium  . Implantable cardioverter-defibrillator (ICD) in situ 02/17/2012    Priority: Medium  . History of skin  cancer 07/05/2007    Priority: Medium  . Hyperlipidemia 10/16/2006    Priority: Medium  . Essential hypertension 10/16/2006    Priority: Medium  . LBBB (left bundle branch block) 08/06/2015    Priority: Low  . Actinic keratosis 11/15/2014    Priority: Low  . Benign neoplasm of colon 12/11/2012    Priority: Low  . COPD (chronic obstructive pulmonary disease) ([place]) 12/26/2018  . Osteoarthritis of right knee 09/22/2018  . Frontal headache 10/06/2017  . Rhinitis 08/22/2017  . Dyspnea on exertion 08/22/2017   Past Surgical History:  Procedure Laterality Date  . BACK SURGERY    . BASAL CELL CARCINOMA EXCISION     nose  . BIV UPGRADE N/A 03/28/2017   Procedure: BiV Upgrade;  Surgeon: Deboraha Sprang, MD;  Location: [place] [facility];  Service: Cardiovascular;  Laterality: N/A;  . CARDIAC CATHETERIZATION N/A 01/20/2016   Procedure: Right/Left Heart Cath and Coronary Angiography;  Surgeon: Jolaine Artist, MD;  Location: [place] [facility];  Service: Cardiovascular;  Laterality: N/A;  . CARDIAC DEFIBRILLATOR PLACEMENT     medtronic virtuoso  . CHOLECYSTECTOMY    . COLONOSCOPY  12/11/2012   Procedure: COLONOSCOPY;  Surgeon: Inda Castle, MD;  [facility];  Service: Endoscopy;  Laterality: N/A;  . KNEE ARTHROPLASTY      Family History  Problem Relation Age of Onset  . Colon cancer Father   . Diabetes Father   .  Heart disease Father   . Bladder Cancer Mother   . Heart disease Mother   . Heart attack Child 28  . Heart attack Child 47    Medications- reviewed and updated Current Outpatient Medications  Medication Sig Dispense Refill  . atorvastatin (LIPITOR) 80 MG tablet TAKE 1 TABLET BY MOUTH ONCE DAILY AS DIRECTED 90 tablet 2  . budesonide-formoterol (SYMBICORT) 80-4.5 MCG/ACT inhaler Inhale 2 puffs into the lungs 2 (two) times daily. (Patient taking differently: Inhale 2 puffs into the lungs as needed. ) 1 Inhaler 2  . carvedilol (COREG) 6.25 MG tablet TAKE 1  TABLET BY MOUTH TWICE DAILY WITH A MEAL 60 tablet 11  . digoxin (LANOXIN) 0.25 MG tablet TAKE 1/2 (ONE-HALF) TABLET BY MOUTH ONCE DAILY 45 tablet 2  . empagliflozin (JARDIANCE) 10 MG TABS tablet Take 10 mg by mouth daily. 30 tablet 11  . fluticasone (FLONASE) 50 MCG/ACT nasal spray Place 1 spray into both nostrils daily. (Patient taking differently: Place 1 spray into both nostrils as needed. ) 16 g 6  . furosemide (LASIX) 20 MG tablet TAKE 1 TABLET BY MOUTH ONCE A WEEK 12 tablet 4  . glimepiride (AMARYL) 4 MG tablet Take 2 tablets (8 mg total) by mouth daily with breakfast. 180 tablet 3  . glucose blood (ACCU-CHEK AVIVA PLUS) test strip TEST BLOOD SUGAR  DAILY 100 each 5  . metFORMIN (GLUCOPHAGE) 1000 MG tablet TAKE 1 TABLET BY MOUTH TWICE DAILY WITH A MEAL. 180 tablet 3  . rivaroxaban (XARELTO) 20 MG TABS tablet Take 1 tablet (20 mg total) by mouth daily. 28 tablet 0  . sacubitril-valsartan (ENTRESTO) 49-51 MG Take 1 tablet by mouth 2 (two) times daily. 60 tablet 11   No current facility-administered medications for this visit.     Allergies-reviewed and updated Allergies  Allergen Reactions  . Penicillins Other (See Comments)    Patient passed out  . Sulfamethoxazole Rash    Social History   Social History Narrative   Married. 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 Some dried blood in left nasal passage CV: regular rate.  no murmurs rubs or gallops Lungs: CTAB no crackles, wheeze, rhonchi Abdomen:  soft/nontender/nondistended/normal bowel sounds.  Ext: no edema Skin: warm, dry Neuro: grossly normal, moves all extremities, PERRLA  Assessment/Plan:  78 y.o. male presenting for annual physical.  Health Maintenance counseling: 1. Anticipatory guidance: Patient counseled regarding regular dental exams -q6 months (doesn't go as no insurance), eye exams -yearly,  avoiding smoking and second hand smoke , limiting alcohol to 2 beverages per day - doesn't drink.   2. Risk factor reduction:  Advised patient of need for regular exercise and diet rich and fruits and vegetables to reduce risk of heart attack and stroke. Exercise- trying to be active around house and yard.. Diet- low salt diet with heart.  Wt Readings from Last 3 Encounters:  12/26/18 207 lb (93.9 kg)  12/05/18 204 lb (92.5  kg)  11/14/18 209 lb 4 oz (94.9 kg)  3. Immunizations/screenings/ancillary studies-up-to-date  Immunization History  Administered Date(s) Administered  . DTaP 09/01/2011  . Influenza Split 09/08/2011  . Influenza Whole 08/23/2008, 09/10/2009, 09/04/2010  . Influenza, High Dose Seasonal PF 09/07/2013, 09/17/2014, 09/10/2016, 08/11/2017, 09/04/2018  . Influenza-Unspecified 09/03/2015  . Pneumococcal Conjugate-13 11/15/2014  . Pneumococcal Polysaccharide-23 09/08/2011  . Tdap 11/15/2014  . Zoster 02/06/2008  . Zoster Recombinat (Shingrix) 05/09/2018, 08/08/2018  4. Prostate cancer screening- past age based screening. Denies change in urinary symptoms  5. Colon cancer screening - 12/11/2012 did have adenoma. 5 year follow up recommended. Patient had stroke at time of last procedure when off coumadin. He also has systolic heart failure, a fib, CAD, history of PE. At 33- we discussed potentially discontinuing colonoscopy - I gave him option of sitting down to chat with Dr. Loletha Carrow but he would prefer I reach out as he leans toward not having another colonoscopy. I think this is reasonable but will keep Dr. Loletha Carrow in the  loop.  6. Skin cancer screening- sees Dr. Ledell Peoples office. advised regular sunscreen use. Denies worrisome, changing, or new skin lesions.  7.  Former smoker-50 pack years but quit in 2014.  Enrolled in lung cancer screening program  Status of chronic or acute concerns   Sinuses doing much better after doxycycline- still some runny nose and occasional nose bleedins  #Atrial fibrillation/ Chronic PE -Rate controlled on carvedilol.  Anticoagulated with Xarelto.  Also on digoxin. - also remains on xarelto given history of PE/DVT- considered chronic with recurrence and long term treatment -Compliant with medications. -Stable continue current medications and cardiology follow-up --discussed flonase increases nose bleed risk  #Heart failure/CAD -Patient remains on carvedilol, Lasix primarily once a week or if gains 3  Lbs (sometimes takes 2 a week), Entresto - stable shortness of breath. No chest pain - continues cardiology follow up  #Hyperlipidemia -Controlled on atorvastatin in the past- updated full lipids in October -Stable-continue current medications  #Osteoarthritis right knee - Advised trial of Voltaren gel minimal help- not taking regularly.  NSAIDs contraindicated orally with Xarelto.  Have discussed can refer to Dr. Paulla Fore if needed. Luckily has done better lately  #Diabetes -Controlled on metformin 1 g twice daily, Amaryl 8 mg, Jardiance 10 mg. - morning sugars have been anywhere from 160 to 200s - started tablespoon of elderberry syrup- highest in January 120s to 130s - no low blood sugars -Last A1c at 7.7 -Hopefully stable-update A1c  #low b12 - took oral supplement for a month but states didn't know a difference so stopped - will recheck today  #COPD - 50 pack years smoking- quit 2014 - doing well on symbicort- honestly only using prn if having cold issues - declines albuterol inhaler  #Hypertension-controlled on Entresto, Lasix as needed, carvedilol 6.25 mg twice  a day.  Patient needs these medications for other indications-specifically heart failure and atrial fibrillation -Patient tolerating lower BP-continue current medications  Future Appointments  Date Time Provider [place]  12/28/2018  3:00 PM Sueanne Margarita, MD [facility]  01/02/2019  3:30 PM Deboraha Sprang, MD [facility]  01/04/2019  9:25 AM [facility] DEVICE REMOTES [facility]  06/25/2019  1:00 PM [facility] HEALTH COACH [facility]   80-month follow-up Lab/Order associations:FASTING  Preventative health care - Plan: CBC, Comprehensive metabolic panel, LDL cholesterol, direct, Hemoglobin A1c, Vitamin B12  Chronic obstructive pulmonary disease, unspecified COPD type ([place])  Controlled type 2 diabetes mellitus without complication, without  long-term current use of insulin ([place]) - Plan: CBC, Comprehensive metabolic panel, LDL cholesterol, direct, Hemoglobin A1c  Low vitamin B12 level - Plan: Vitamin B12  Coronary artery disease involving native coronary artery of native heart without angina pectoris  Other chronic pulmonary embolism without acute cor pulmonale (HCC)  Chronic systolic heart failure (H[facility])  Chronic atrial fibrillation  BMI 28.0-28.9,adult  Return precautions advised.  [name], MD

## 2018-12-26 NOTE — Patient Instructions (Addendum)
Please stop by lab before you go  No changes planned today- other than likely restarting b12 after we get bloodwork back- even if you could take a few times a week that would be great  I did message Dr. Loletha Carrow to make sure he was okay with our plan of discontinuing colonoscopies-particularly given your stroke off Coumadin in the past.

## 2018-12-28 ENCOUNTER — Ambulatory Visit: Payer: Medicare HMO | Admitting: Cardiology

## 2018-12-28 ENCOUNTER — Encounter: Payer: Self-pay | Admitting: Cardiology

## 2018-12-28 VITALS — BP 96/62 | HR 85 | Ht 72.0 in | Wt 208.0 lb

## 2018-12-28 DIAGNOSIS — I10 Essential (primary) hypertension: Secondary | ICD-10-CM

## 2018-12-28 DIAGNOSIS — G4733 Obstructive sleep apnea (adult) (pediatric): Secondary | ICD-10-CM

## 2018-12-28 LAB — VITAMIN B12: Vitamin B-12: 359 pg/mL (ref 211–911)

## 2018-12-28 NOTE — Progress Notes (Signed)
Cardiology Office Note:    Date:  12/28/2018   ID:  Carl Wood, DOB August 28, 1941, MRN [id number]  PCP:  Marin Olp, MD  Cardiologist:  No primary care provider on file.    Referring MD: Marin Olp, MD   Chief Complaint  Patient presents with  . Sleep Apnea  . Hypertension    History of Present Illness:    Carl Wood is a 78 y.o. male with a hx of moderate to severe OSA with an AHI of 25.2/hrand is on CPAP at8cm H2O.  he is doing well with his CPAP device.  He tolerates the mask and feels the pressure is adequate.  Since going on CPAP he feels rested in the am and has no significant daytime sleepiness.  He denies any significant mouth or nasal dryness or nasal congestion.  He does not think that he snores.     Past Medical History:  Diagnosis Date  . CAD (coronary artery disease)   . CHF (congestive heart failure) ([place])   . Chronic atrial fibrillation   . Colon polyps   . Diabetes mellitus   . Diverticulosis of colon   . DIVERTICULOSIS, COLON 10/16/2006   Qualifier: Diagnosis of  By: Leanne Chang MD, [place]    . Excessive daytime sleepiness 02/19/2016  . Hyperlipidemia   . Hypertension   . MI (mitral incompetence)   . Nephrolithiasis   . NEPHROLITHIASIS 06/26/2008   Qualifier: Diagnosis of  By: Sherlynn Stalls, CMA, [place]    . OSA (obstructive sleep apnea) 02/19/2016   Moderate to severe OSA with an AHI of 25/hr and on CPAP at 8cm H2O  . PE (pulmonary embolism)   . V-tach [facility])     Past Surgical History:  Procedure Laterality Date  . BACK SURGERY    . BASAL CELL CARCINOMA EXCISION     nose  . BIV UPGRADE N/A 03/28/2017   Procedure: BiV Upgrade;  Surgeon: Deboraha Sprang, MD;  Location: [place] [facility];  Service: Cardiovascular;  Laterality: N/A;  . CARDIAC CATHETERIZATION N/A 01/20/2016   Procedure: Right/Left Heart Cath and Coronary Angiography;  Surgeon: Jolaine Artist, MD;  Location: [place] [facility];  Service: Cardiovascular;  Laterality: N/A;  .  CARDIAC DEFIBRILLATOR PLACEMENT     medtronic virtuoso  . CHOLECYSTECTOMY    . COLONOSCOPY  12/11/2012   Procedure: COLONOSCOPY;  Surgeon: Inda Castle, MD;  [facility];  Service: Endoscopy;  Laterality: N/A;  . KNEE ARTHROPLASTY      Current Medications: Current Meds  Medication Sig  . atorvastatin (LIPITOR) 80 MG tablet TAKE 1 TABLET BY MOUTH ONCE DAILY AS DIRECTED  . budesonide-formoterol (SYMBICORT) 80-4.5 MCG/ACT inhaler Inhale 2 puffs into the lungs 2 (two) times daily. (Patient taking differently: Inhale 2 puffs into the lungs as needed. )  . carvedilol (COREG) 6.25 MG tablet TAKE 1 TABLET BY MOUTH TWICE DAILY WITH A MEAL  . digoxin (LANOXIN) 0.25 MG tablet TAKE 1/2 (ONE-HALF) TABLET BY MOUTH ONCE DAILY  . empagliflozin (JARDIANCE) 10 MG TABS tablet Take 10 mg by mouth daily.  . fluticasone (FLONASE) 50 MCG/ACT nasal spray Place 1 spray into both nostrils daily. (Patient taking differently: Place 1 spray into both nostrils as needed. )  . furosemide (LASIX) 20 MG tablet TAKE 1 TABLET BY MOUTH ONCE A WEEK  . glimepiride (AMARYL) 4 MG tablet Take 2 tablets (8 mg total) by mouth daily with breakfast.  . glucose blood (ACCU-CHEK AVIVA PLUS) test strip TEST  BLOOD SUGAR  DAILY  . metFORMIN (GLUCOPHAGE) 1000 MG tablet TAKE 1 TABLET BY MOUTH TWICE DAILY WITH A MEAL.  . rivaroxaban (XARELTO) 20 MG TABS tablet Take 1 tablet (20 mg total) by mouth daily.  . sacubitril-valsartan (ENTRESTO) 49-51 MG Take 1 tablet by mouth 2 (two) times daily.     Allergies:   Penicillins and Sulfamethoxazole   Social History   Socioeconomic History  . Marital status: Married    Spouse name: [name]  . Number of children: 3  . Years of education: 46  . Highest education level: Not on file  Occupational History  . Occupation: Retired    Fish farm manager: RETIRED  Social Needs  . Financial resource strain: Not on file  . Food insecurity:    Worry: Not on file    Inability: Not on file  .  Transportation needs:    Medical: Not on file    Non-medical: Not on file  Tobacco Use  . Smoking status: Former Smoker    Packs/day: 1.00    Years: 50.00    Pack years: 50.00    Types: Cigarettes    Last attempt to quit: 12/15/2012    Years since quitting: 6.0  . Smokeless tobacco: Never Used  Substance and Sexual Activity  . Alcohol use: No    Alcohol/week: 0.0 standard drinks  . Drug use: No  . Sexual activity: Yes    Birth control/protection: None  Lifestyle  . Physical activity:    Days per week: Not on file    Minutes per session: Not on file  . Stress: Not on file  Relationships  . Social connections:    Talks on phone: Not on file    Gets together: Not on file    Attends religious service: Not on file    Active member of club or organization: Not on file    Attends meetings of clubs or organizations: Not on file    Relationship status: Not on file  Other Topics Concern  . Not on file  Social History Narrative   Married. 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  ROS:   Please see the history of present illness.    ROS  All other systems reviewed and negative.   EKGs/Labs/Other Studies Reviewed:    The following studies were reviewed today: PAP download  EKG:  EKG is not ordered today.    Recent Labs: 12/26/2018: ALT 12; BUN 16; Creatinine, Ser 0.87; Hemoglobin 15.2; Platelets 194.0; Potassium 4.7; Sodium 142   Recent Lipid Panel    Component Value Date/Time   CHOL 134 09/22/2018 1048   TRIG  122.0 09/22/2018 1048   TRIG 53 10/10/2006 1113   HDL 33.50 (L) 09/22/2018 1048   CHOLHDL 4 09/22/2018 1048   VLDL 24.4 09/22/2018 1048   LDLCALC 76 09/22/2018 1048   LDLDIRECT 79.0 12/26/2018 0941    Physical Exam:    VS:  BP 96/62   Pulse 85   Ht 6' (1.829 m)   [facility]  208 lb (94.3 kg)   BMI 28.21 kg/m     Wt Readings from Last 3 Encounters:  12/28/18 208 lb (94.3 kg)  12/26/18 207 lb (93.9 kg)  12/05/18 204 lb (92.5 kg)     GEN:  Well nourished, well developed in no acute distress HEENT: Normal NECK: No JVD; No carotid bruits LYMPHATICS: No lymphadenopathy CARDIAC: RRR, no murmurs, rubs, gallops RESPIRATORY:  Clear to auscultation without rales, wheezing or rhonchi  ABDOMEN: Soft, non-tender, non-distended MUSCULOSKELETAL:  No edema; No deformity  SKIN: Warm and dry NEUROLOGIC:  Alert and oriented x 3 PSYCHIATRIC:  Normal affect   ASSESSMENT:    1. OSA (obstructive sleep apnea)   2. Essential hypertension    PLAN:    In order of problems listed above:  1.  OSA - the patient is tolerating PAP therapy well without any problems. The PAP download was reviewed today and showed an AHI of 0.9/hr on 8 cm H2O with 80% compliance in using more than 4 hours nightly.  The patient has been using and benefiting from PAP use and will continue to benefit from therapy.   2.  HTN - BP is controlled on exam today.  He will continue on carvedilol 6.25mg  BID and Entresto 49-51mg  BID.    Medication Adjustments/Labs and Tests Ordered: Current medicines are reviewed at length with the patient today.  Concerns regarding medicines are outlined above.  No orders of the defined types were placed in this encounter.  No orders of the defined types were placed in this encounter.   Signed, Fransico Him, MD  12/28/2018 3:11 PM    [place]

## 2018-12-28 NOTE — Patient Instructions (Signed)
Medication Instructions:  Your physician recommends that you continue on your current medications as directed. Please refer to the Current Medication list given to you today.  If you need a refill on your cardiac medications before your next appointment, please call your pharmacy.   Lab work: None If you have labs (blood work) drawn today and your tests are completely normal, you will receive your results only by: . MyChart Message (if you have MyChart) OR . A paper copy in the mail If you have any lab test that is abnormal or we need to change your treatment, we will call you to review the results.  Testing/Procedures: None  Follow-Up: At [facility], you and your health needs are our priority.  As part of our continuing mission to provide you with exceptional heart care, we have created designated Provider Care Teams.  These Care Teams include your primary Cardiologist (physician) and Advanced Practice Providers (APPs -  Physician Assistants and Nurse Practitioners) who all work together to provide you with the care you need, when you need it. You will need a follow up appointment in 1 years.  Please call our office 2 months in advance to schedule this appointment.  You may see Dr. Turner or one of the following Advanced Practice Providers on your designated Care Team:     

## 2018-12-28 NOTE — Addendum Note (Signed)
Addended by: Sarina Ill on: 12/28/2018 03:23 PM   Modules accepted: Orders

## 2019-01-01 ENCOUNTER — Other Ambulatory Visit: Payer: Self-pay | Admitting: Family Medicine

## 2019-01-01 NOTE — Telephone Encounter (Signed)
Copied from [place] [id number]. Topic: Quick Communication - Rx Refill/Question >> Jan 01, 2019  3:32 PM Sheran Luz wrote: Medication: glimepiride (AMARYL) 4 MG tablet   Patient is requesting a refill of this medication.   Preferred Pharmacy (with phone number or street name):[facility], [place] - [place]  [phone] (Phone) [phone] (Fax)

## 2019-01-02 ENCOUNTER — Ambulatory Visit: Payer: Medicare HMO | Admitting: Internal Medicine

## 2019-01-02 ENCOUNTER — Encounter: Payer: Self-pay | Admitting: Internal Medicine

## 2019-01-02 VITALS — BP 110/62 | HR 84 | Ht 72.0 in | Wt 205.8 lb

## 2019-01-02 DIAGNOSIS — I472 Ventricular tachycardia, unspecified: Secondary | ICD-10-CM

## 2019-01-02 DIAGNOSIS — I255 Ischemic cardiomyopathy: Secondary | ICD-10-CM

## 2019-01-02 DIAGNOSIS — I2510 Atherosclerotic heart disease of native coronary artery without angina pectoris: Secondary | ICD-10-CM | POA: Diagnosis not present

## 2019-01-02 DIAGNOSIS — Z95810 Presence of automatic (implantable) cardiac defibrillator: Secondary | ICD-10-CM

## 2019-01-02 MED ORDER — GLIMEPIRIDE 4 MG PO TABS: 8.0000 mg | ORAL_TABLET | Freq: Every day | ORAL | 1 refills | Status: DC

## 2019-01-02 NOTE — Patient Instructions (Addendum)

## 2019-01-02 NOTE — Progress Notes (Signed)
Patient Care Team: Marin Olp, MD as PCP - General (Family Medicine)   HPI  Carl Wood is a 78 y.o. male s seen in followup for ischemic heart disease with ICD implanted for ventricular tachycardia. He has history of bypass grafting and depressed left ventricular function. He has a history of recurrent ventricular tachycardia--most recently polymorphic in september 2101 associated with low-normal magnesium.  He underwent a Myoview scanning demonstrated ejection fraction of 27% without ischemia but with a large LAD infarct.   He has permanent atrial fibrillation.   Echocardiogram 1/14 EF 38-35% 06/2015 ECHO EF 20% Severely dilated mild TR,   2/17  Cath. Severe 1v CAD with high grade mid LAD lesion (anterior wall felt to be non-viable so treated medically)    Ischemic CM with EF 25-30% with akinesis of mid anterior wall and aneurysmal deformity of distal anterior wall and apex DATE TEST EF   7/16 Echo    20 %   2/17 Cath   LAD disease  Nonviable ant wall   10/17 Echo   20-25%   8/19 Echo  20%     He underwent CRT upgrade 4/18.--He had an IVCD but anterior precordial notching.  He continues to sleep a lot.  He has stable shortness of breath.  He has reactive airways disease but does not use his nebulizers regularly.  No edema or chest pain.  Thromboembolic risk factors ( age -42 , HTN-1, TIA/CVA-2, DM-1, Vasc disease -1, CHF-1) for a CHADSVASc Score of 7   Past Medical History:  Diagnosis Date  . CAD (coronary artery disease)   . CHF (congestive heart failure) ([place])   . Chronic atrial fibrillation   . Colon polyps   . Diabetes mellitus   . Diverticulosis of colon   . DIVERTICULOSIS, COLON 10/16/2006   Qualifier: Diagnosis of  By: Leanne Chang MD, [place]    . Excessive daytime sleepiness 02/19/2016  . Hyperlipidemia   . Hypertension   . MI (mitral incompetence)   . Nephrolithiasis   . NEPHROLITHIASIS 06/26/2008   Qualifier: Diagnosis of  By: Sherlynn Stalls, CMA, [place]     . OSA (obstructive sleep apnea) 02/19/2016   Moderate to severe OSA with an AHI of 25/hr and on CPAP at 8cm H2O  . PE (pulmonary embolism)   . V-tach [facility])     Past Surgical History:  Procedure Laterality Date  . BACK SURGERY    . BASAL CELL CARCINOMA EXCISION     nose  . BIV UPGRADE N/A 03/28/2017   Procedure: BiV Upgrade;  Surgeon: Deboraha Sprang, MD;  Location: [place] CV LAB;  Service: Cardiovascular;  Laterality: N/A;  . CARDIAC CATHETERIZATION N/A 01/20/2016   Procedure: Right/Left Heart Cath and Coronary Angiography;  Surgeon: Jolaine Artist, MD;  Location: [place] CV LAB;  Service: Cardiovascular;  Laterality: N/A;  . CARDIAC DEFIBRILLATOR PLACEMENT     medtronic virtuoso  . CHOLECYSTECTOMY    . COLONOSCOPY  12/11/2012   Procedure: COLONOSCOPY;  Surgeon: Inda Castle, MD;  Location: [facility];  Service: Endoscopy;  Laterality: N/A;  . KNEE ARTHROPLASTY      Current Outpatient Medications  Medication Sig Dispense Refill  . atorvastatin (LIPITOR) 80 MG tablet TAKE 1 TABLET BY MOUTH ONCE DAILY AS DIRECTED 90 tablet 2  . budesonide-formoterol (SYMBICORT) 80-4.5 MCG/ACT inhaler Inhale 2 puffs into the lungs 2 (two) times daily. 1 Inhaler 2  . carvedilol (COREG) 6.25 MG tablet TAKE 1 TABLET BY MOUTH  TWICE DAILY WITH A MEAL 60 tablet 11  . digoxin (LANOXIN) 0.25 MG tablet TAKE 1/2 (ONE-HALF) TABLET BY MOUTH ONCE DAILY 45 tablet 2  . empagliflozin (JARDIANCE) 10 MG TABS tablet Take 10 mg by mouth daily. 30 tablet 11  . fluticasone (FLONASE) 50 MCG/ACT nasal spray Place 1 spray into both nostrils daily. 16 g 6  . furosemide (LASIX) 20 MG tablet TAKE 1 TABLET BY MOUTH ONCE A WEEK 12 tablet 4  . glimepiride (AMARYL) 4 MG tablet Take 2 tablets (8 mg total) by mouth daily with breakfast. 180 tablet 1  . glucose blood (ACCU-CHEK AVIVA PLUS) test strip TEST BLOOD SUGAR  DAILY 100 each 5  . metFORMIN (GLUCOPHAGE) 1000 MG tablet TAKE 1 TABLET BY MOUTH TWICE DAILY WITH A MEAL.  180 tablet 3  . rivaroxaban (XARELTO) 20 MG TABS tablet Take 1 tablet (20 mg total) by mouth daily. 28 tablet 0  . sacubitril-valsartan (ENTRESTO) 49-51 MG Take 1 tablet by mouth 2 (two) times daily. 60 tablet 11   No current facility-administered medications for this visit.     Allergies  Allergen Reactions  . Penicillins Other (See Comments)    Patient passed out  . Sulfamethoxazole Rash    Review of Systems negative except from HPI and PMH  Physical Exam BP 110/62   Pulse 84   Ht 6' (1.829 m)   Wt 205 lb 12.8 oz (93.4 kg)   SpO2 96%   BMI 27.91 kg/m  Well developed and nourished in no acute distress HENT normal Neck supple with JVP-flat Wheezing Regular rate and rhythm, no murmurs or gallops Abd-soft with active BS No Clubbing cyanosis edema Skin-warm and dry A & Oriented  Grossly normal sensory and motor function    ECG demonstrates BiV pacing configuration Upright QRS V1 and rS lead 1  Assessment and  Plan  Atrial fibrillation-permanent  IVCD  Ischemic cardiomyopathy prior bypass surgery  ICD-Medtronic-CRT   Subdural hematoma  Reactive airways disease  On Anticoagulation;  No bleeding issues   Without symptoms of ischemia  Euvolemic continue current meds  Wonder whether reactive airway disease is not contributing.  Have suggested that he use his nebulizers regularly and follow-up with pulmonary.  We spent more than 50% of our >25 min visit in face to face counseling regarding the above

## 2019-01-03 LAB — CUP PACEART INCLINIC DEVICE CHECK
Battery Remaining Longevity: 43 mo
Brady Statistic AP VP Percent: 0 %
Brady Statistic AP VS Percent: 0 %
Brady Statistic AS VP Percent: 0 %
Brady Statistic AS VS Percent: 0 %
Brady Statistic RV Percent Paced: 96.9 %
Date Time Interrogation Session: 20200128223610
HighPow Impedance: 73 Ohm
Implantable Lead Implant Date: 20010723
Implantable Lead Implant Date: 20180423
Implantable Lead Location: 753860
Implantable Lead Model: 6943
Implantable Pulse Generator Implant Date: 20180423
Lead Channel Impedance Value: 1007 Ohm
Lead Channel Impedance Value: 1007 Ohm
Lead Channel Impedance Value: 1026 Ohm
Lead Channel Impedance Value: 1026 Ohm
Lead Channel Impedance Value: 275.5 Ohm
Lead Channel Impedance Value: 275.5 Ohm
Lead Channel Impedance Value: 275.5 Ohm
Lead Channel Impedance Value: 284.683
Lead Channel Impedance Value: 284.683
Lead Channel Impedance Value: 342 Ohm
Lead Channel Impedance Value: 4047 Ohm
Lead Channel Impedance Value: 418 Ohm
Lead Channel Impedance Value: 551 Ohm
Lead Channel Impedance Value: 551 Ohm
Lead Channel Impedance Value: 551 Ohm
Lead Channel Impedance Value: 589 Ohm
Lead Channel Impedance Value: 988 Ohm
Lead Channel Impedance Value: 988 Ohm
Lead Channel Pacing Threshold Pulse Width: 0.4 ms
Lead Channel Sensing Intrinsic Amplitude: 11.375 mV
Lead Channel Sensing Intrinsic Amplitude: 13.125 mV
Lead Channel Setting Pacing Amplitude: 2.75 V
Lead Channel Setting Pacing Pulse Width: 1 ms
Lead Channel Setting Sensing Sensitivity: 0.3 mV
MDC IDC LEAD LOCATION: 753858
MDC IDC MSMT BATTERY VOLTAGE: 2.95 V
MDC IDC MSMT LEADCHNL LV PACING THRESHOLD AMPLITUDE: 1.75 V
MDC IDC MSMT LEADCHNL LV PACING THRESHOLD PULSEWIDTH: 1 ms
MDC IDC MSMT LEADCHNL RV PACING THRESHOLD AMPLITUDE: 1.25 V
MDC IDC SET LEADCHNL RV PACING AMPLITUDE: 3 V
MDC IDC SET LEADCHNL RV PACING PULSEWIDTH: 0.4 ms
MDC IDC STAT BRADY RA PERCENT PACED: 0 %

## 2019-01-04 ENCOUNTER — Ambulatory Visit (INDEPENDENT_AMBULATORY_CARE_PROVIDER_SITE_OTHER): Payer: Medicare HMO

## 2019-01-04 DIAGNOSIS — I472 Ventricular tachycardia, unspecified: Secondary | ICD-10-CM

## 2019-01-05 LAB — CUP PACEART REMOTE DEVICE CHECK
Battery Remaining Longevity: 47 mo
Battery Voltage: 2.94 V
Brady Statistic AP VP Percent: 0 %
Brady Statistic AP VS Percent: 0 %
Brady Statistic AS VP Percent: 0 %
Brady Statistic AS VS Percent: 0 %
Brady Statistic RA Percent Paced: 0 %
Brady Statistic RV Percent Paced: 97.56 %
Date Time Interrogation Session: 20200130083524
HIGH POWER IMPEDANCE MEASURED VALUE: 79 Ohm
Implantable Lead Implant Date: 20010723
Implantable Lead Implant Date: 20180423
Implantable Lead Location: 753858
Implantable Lead Location: 753860
Implantable Lead Model: 6943
Implantable Pulse Generator Implant Date: 20180423
Lead Channel Impedance Value: 1007 Ohm
Lead Channel Impedance Value: 1007 Ohm
Lead Channel Impedance Value: 261.164
Lead Channel Impedance Value: 265.661
Lead Channel Impedance Value: 270.667
Lead Channel Impedance Value: 274.19 Ohm
Lead Channel Impedance Value: 284.683
Lead Channel Impedance Value: 304 Ohm
Lead Channel Impedance Value: 4047 Ohm
Lead Channel Impedance Value: 418 Ohm
Lead Channel Impedance Value: 513 Ohm
Lead Channel Impedance Value: 551 Ohm
Lead Channel Impedance Value: 589 Ohm
Lead Channel Impedance Value: 950 Ohm
Lead Channel Impedance Value: 950 Ohm
Lead Channel Impedance Value: 988 Ohm
Lead Channel Impedance Value: 988 Ohm
Lead Channel Pacing Threshold Amplitude: 1.375 V
Lead Channel Pacing Threshold Pulse Width: 0.4 ms
Lead Channel Pacing Threshold Pulse Width: 1 ms
Lead Channel Sensing Intrinsic Amplitude: 11.375 mV
Lead Channel Sensing Intrinsic Amplitude: 13.125 mV
Lead Channel Setting Pacing Amplitude: 2.5 V
Lead Channel Setting Pacing Amplitude: 2.75 V
Lead Channel Setting Pacing Pulse Width: 1 ms
Lead Channel Setting Sensing Sensitivity: 0.3 mV
MDC IDC MSMT LEADCHNL LV IMPEDANCE VALUE: 532 Ohm
MDC IDC MSMT LEADCHNL LV PACING THRESHOLD AMPLITUDE: 1.75 V
MDC IDC SET LEADCHNL RV PACING PULSEWIDTH: 0.4 ms

## 2019-01-11 DIAGNOSIS — G4733 Obstructive sleep apnea (adult) (pediatric): Secondary | ICD-10-CM | POA: Diagnosis not present

## 2019-01-12 NOTE — Progress Notes (Signed)
Remote ICD transmission.   

## 2019-01-15 ENCOUNTER — Encounter: Payer: Self-pay | Admitting: Cardiology

## 2019-03-06 ENCOUNTER — Telehealth (HOSPITAL_COMMUNITY): Payer: Self-pay | Admitting: Vascular Surgery

## 2019-03-06 NOTE — Telephone Encounter (Signed)
Left pt message to change appt to a tele visit

## 2019-03-12 ENCOUNTER — Encounter (HOSPITAL_COMMUNITY): Payer: Self-pay | Admitting: *Deleted

## 2019-03-12 ENCOUNTER — Other Ambulatory Visit: Payer: Self-pay

## 2019-03-12 ENCOUNTER — Ambulatory Visit (HOSPITAL_COMMUNITY)
Admission: RE | Admit: 2019-03-12 | Discharge: 2019-03-12 | Disposition: A | Discharge status: Home / Self Care | Payer: Medicare HMO | Source: Ambulatory Visit | Attending: Internal Medicine | Admitting: Internal Medicine

## 2019-03-12 DIAGNOSIS — I482 Chronic atrial fibrillation, unspecified: Secondary | ICD-10-CM

## 2019-03-12 DIAGNOSIS — I255 Ischemic cardiomyopathy: Secondary | ICD-10-CM

## 2019-03-12 DIAGNOSIS — I5022 Chronic systolic (congestive) heart failure: Secondary | ICD-10-CM | POA: Diagnosis not present

## 2019-03-12 DIAGNOSIS — I2510 Atherosclerotic heart disease of native coronary artery without angina pectoris: Secondary | ICD-10-CM

## 2019-03-12 NOTE — Progress Notes (Signed)
Heart Failure TeleHealth Note  Due to national recommendations of social distancing due to [place] 19, Audio/video telehealth visit is felt to be most appropriate for this patient at this time.  See MyChart message from today for patient consent regarding telehealth for [facility]  Date:  03/12/2019   ID:  Carl Wood, DOB 1941-03-28, MRN [id number]  Location: Home  Provider location: [address] [place] Type of Visit: Established patient PCP:  Carl Olp, Wood  Cardiologist:  No primary care provider on file. Primary HF: Dr Carl Wood   Chief Complaint: Heart Failure   History of Present Illness: Carl Wood is a 79 y.o. male who presents via audio conferencing for a telehealth visit today.     Mr Carl Wood is a 78 year old with a history of CAD s/p MI 1983, ICM with chronic systolic heart failure s/p Medtronic single chamber ICD (EF 20% on last echo 8/19), Carl Wood 2014, DM , VT, PE, HTN, permanent A fib on Xarelto, SDH 2019,  OSA, and former smoker.   Had CRT-D upgrade in April 2018.   In 12/18 saw Dr. Tomi Wood for chronic HA. Found to have small SDHs that were felt to be chronic after a previous fall when trimming vines. Xarelto stopped. F/u C 2/19 SDHs resolved. Xarelto restarted in 2/19  Says he feels pretty good. 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 Takes his time and has to take breaks every 10-15 minutes. Says his back is main limiting factor. No CP, orthopnea, PND or edema.  No dizziness. No problems with medications. Weight stable.   He denies symptoms of cough, fevers, chills, or new SOB worrisome for COVID 19.   Past Medical History:  Diagnosis Date  . CAD (coronary artery disease)   . CHF (congestive heart failure) ([place])   . Chronic atrial fibrillation   . Colon polyps   . Diabetes mellitus   . Diverticulosis of colon   . DIVERTICULOSIS, COLON 10/16/2006   Qualifier: Diagnosis of  By: Carl Wood    .  Excessive daytime sleepiness 02/19/2016  . Hyperlipidemia   . Hypertension   . MI (mitral incompetence)   . Nephrolithiasis   . NEPHROLITHIASIS 06/26/2008   Qualifier: Diagnosis of  By: Carl Wood, CMA, Carl Wood    . OSA (obstructive sleep apnea) 02/19/2016   Moderate to severe OSA with an AHI of 25/hr and on CPAP at 8cm H2O  . PE (pulmonary embolism)   . V-tach [facility])    Past Surgical History:  Procedure Laterality Date  . BACK SURGERY    . BASAL CELL CARCINOMA EXCISION     nose  . BIV UPGRADE N/A 03/28/2017   Procedure: BiV Upgrade;  Surgeon: Carl Sprang, Wood;  Location: [place] CV LAB;  Service: Cardiovascular;  Laterality: N/A;  . CARDIAC CATHETERIZATION N/A 01/20/2016   Procedure: Right/Left Heart Cath and Coronary Angiography;  Surgeon: Carl Artist, Wood;  Location: [place] CV LAB;  Service: Cardiovascular;  Laterality: N/A;  . CARDIAC DEFIBRILLATOR PLACEMENT     medtronic virtuoso  . CHOLECYSTECTOMY    . COLONOSCOPY  12/11/2012   Procedure: COLONOSCOPY;  Surgeon: Carl Castle, Wood;  [facility];  Service: Endoscopy;  Laterality: N/A;  . KNEE ARTHROPLASTY       Current Outpatient Medications  Medication Sig Dispense Refill  . atorvastatin (LIPITOR) 80 MG tablet TAKE 1 TABLET BY MOUTH ONCE DAILY AS DIRECTED 90  tablet 2  . budesonide-formoterol (SYMBICORT) 80-4.5 MCG/ACT inhaler Inhale 2 puffs into the lungs 2 (two) times daily. 1 Inhaler 2  . carvedilol (COREG) 6.25 MG tablet TAKE 1 TABLET BY MOUTH TWICE DAILY WITH A MEAL 60 tablet 11  . digoxin (LANOXIN) 0.25 MG tablet TAKE 1/2 (ONE-HALF) TABLET BY MOUTH ONCE DAILY 45 tablet 2  . empagliflozin (JARDIANCE) 10 MG TABS tablet Take 10 mg by mouth daily. 30 tablet 11  . fluticasone (FLONASE) 50 MCG/ACT nasal spray Place 1 spray into both nostrils daily. 16 g 6  . furosemide (LASIX) 20 MG tablet TAKE 1 TABLET BY MOUTH ONCE A WEEK 12 tablet 4  . glimepiride (AMARYL) 4 MG tablet Take 2 tablets (8 mg total) by mouth  daily with breakfast. 180 tablet 1  . glucose blood (ACCU-CHEK AVIVA PLUS) test strip TEST BLOOD SUGAR  DAILY 100 each 5  . metFORMIN (GLUCOPHAGE) 1000 MG tablet TAKE 1 TABLET BY MOUTH TWICE DAILY WITH A MEAL. 180 tablet 3  . rivaroxaban (XARELTO) 20 MG TABS tablet Take 1 tablet (20 mg total) by mouth daily. 28 tablet 0  . sacubitril-valsartan (ENTRESTO) 49-51 MG Take 1 tablet by mouth 2 (two) times daily. 60 tablet 11   No current facility-administered medications for this encounter.     Allergies:   Penicillins and Sulfamethoxazole   Social History:  The patient  reports that he quit smoking about 6 years ago. His smoking use included cigarettes. He has a 50.00 pack-year smoking history. He has never used smokeless tobacco. He reports that he does not drink alcohol or use drugs.   Family History:  The patient's family history includes Bladder Cancer in his mother; Colon cancer in his father; Diabetes in his father; Heart attack (age of onset: 80) in his child; Heart attack (age of onset: 59) in his child; Heart disease in his father and mother.   ROS:  Please see the history of present illness.   All other systems are personally reviewed and negative.   Exam: Tele Health Call subjective.  Lungs: Normal respiratory effort with conversation.  Extremities: Pt denies edema. Neuro: Alert & oriented x 3.   Recent Labs: 12/26/2018: ALT 12; BUN 16; Creatinine, Ser 0.87; Hemoglobin 15.2; Platelets 194.0; Potassium 4.7; Sodium 142  Personally reviewed   Wt Readings from Last 3 Encounters:  01/02/19 93.4 kg (205 lb 12.8 oz)  12/28/18 94.3 kg (208 lb)  12/26/18 93.9 kg (207 lb)    ASSESSMENT AND PLAN:  1. Chronic Systolic HF: ICM, s/p Medtronic Bi-V ICD (upgrade 4/18). Echo 09/2016 EF 25-30%. ECHO 8/19 EF 20% - Stable NYHA II-III - CPX testing on 5/18 pVO2 down 14.5->13.0 but he feels better and is able to do all ADLs and his yard work without too much difficulty - Overall stable. If  deteriorates any - would consider repeat CPX.  - Will continue to follow closely. If he gets worse can consider VAD (RV ok on echo) - age permitting - Weight and volume status appear stable.  - Continue Coreg to 6.25 mg BID. (recently reduced from 12.5 bid) - Continue Entresto 49/51 mg BID. Was previosuly on full dose but had to cut back.  - Continue Jardiance - No Spiro with history of hyperkalemia and soft BP.    2. OSA - Continue CPAP. Follows with Dr. Radford Pax  3. Chronic A fib - Rate controlled. Xarelto restarted after resolution SDH. Tolerating well. No bleeding  4. CAD: - Has known distal LAD lesion 95%  and mid LAD 40%. No benefit to revascularization given wall motion abnormality.  - No s/s ischemia. Continue statin. Off ASA with Xarelto     COVID screen The patient does not have any symptoms that suggest any further testing/ screening at this time.  Social distancing reinforced today.  Recommended follow-up:  3-4 months  Relevant cardiac medications were reviewed at length with the patient today.   The patient does not have concerns regarding their medications at this time.    Patient Risk: After full review of this patients clinical status, I feel that they are at moderate risk for cardiac decompensation at this time.  Today, I have spent 11 minutes with the patient with telehealth technology discussing above.    Signed, Glori Bickers, Wood  11:37 AM   [facility] [address] [place] [address] [phone] (office) [phone] (fax)

## 2019-03-12 NOTE — Addendum Note (Signed)
Encounter addended by: Scarlette Calico, RN on: 03/12/2019 12:06 PM  Actions taken: Clinical Note Signed

## 2019-03-12 NOTE — Patient Instructions (Signed)
Please continue current medications  Your physician recommends that you schedule a follow-up appointment in: 3-4 months, please call our office in June to schedule this  If you have any questions or concerns before your next appointment please call our office at [phone] or send a message through [employer]

## 2019-03-12 NOTE — Progress Notes (Signed)
AVS sent to pt via mychart. 

## 2019-03-14 ENCOUNTER — Encounter (HOSPITAL_COMMUNITY): Payer: Medicare HMO | Admitting: Internal Medicine

## 2019-03-26 ENCOUNTER — Other Ambulatory Visit: Payer: Self-pay | Admitting: Internal Medicine

## 2019-04-05 ENCOUNTER — Ambulatory Visit (INDEPENDENT_AMBULATORY_CARE_PROVIDER_SITE_OTHER): Payer: Medicare HMO | Admitting: *Deleted

## 2019-04-05 ENCOUNTER — Other Ambulatory Visit: Payer: Self-pay

## 2019-04-05 DIAGNOSIS — I255 Ischemic cardiomyopathy: Secondary | ICD-10-CM

## 2019-04-05 LAB — CUP PACEART REMOTE DEVICE CHECK
Battery Remaining Longevity: 42 mo
Battery Voltage: 2.95 V
Brady Statistic AP VP Percent: 0 %
Brady Statistic AP VS Percent: 0 %
Brady Statistic AS VP Percent: 0 %
Brady Statistic AS VS Percent: 0 %
Brady Statistic RA Percent Paced: 0 %
Brady Statistic RV Percent Paced: 97.9 %
Date Time Interrogation Session: 20200430194946
HighPow Impedance: 73 Ohm
Implantable Lead Implant Date: 20010723
Implantable Lead Implant Date: 20180423
Implantable Lead Location: 753858
Implantable Lead Location: 753860
Implantable Lead Model: 6943
Implantable Pulse Generator Implant Date: 20180423
Lead Channel Impedance Value: 1007 Ohm
Lead Channel Impedance Value: 1026 Ohm
Lead Channel Impedance Value: 266 Ohm
Lead Channel Impedance Value: 270.667
Lead Channel Impedance Value: 270.667
Lead Channel Impedance Value: 279.525
Lead Channel Impedance Value: 284.683
Lead Channel Impedance Value: 304 Ohm
Lead Channel Impedance Value: 4047 Ohm
Lead Channel Impedance Value: 418 Ohm
Lead Channel Impedance Value: 532 Ohm
Lead Channel Impedance Value: 532 Ohm
Lead Channel Impedance Value: 551 Ohm
Lead Channel Impedance Value: 589 Ohm
Lead Channel Impedance Value: 931 Ohm
Lead Channel Impedance Value: 950 Ohm
Lead Channel Impedance Value: 988 Ohm
Lead Channel Impedance Value: 988 Ohm
Lead Channel Pacing Threshold Amplitude: 1.25 V
Lead Channel Pacing Threshold Amplitude: 1.625 V
Lead Channel Pacing Threshold Pulse Width: 0.4 ms
Lead Channel Pacing Threshold Pulse Width: 1 ms
Lead Channel Sensing Intrinsic Amplitude: 13.5 mV
Lead Channel Sensing Intrinsic Amplitude: 13.5 mV
Lead Channel Setting Pacing Amplitude: 2.25 V
Lead Channel Setting Pacing Amplitude: 2.75 V
Lead Channel Setting Pacing Pulse Width: 0.4 ms
Lead Channel Setting Pacing Pulse Width: 1 ms
Lead Channel Setting Sensing Sensitivity: 0.3 mV

## 2019-04-12 ENCOUNTER — Encounter: Payer: Self-pay | Admitting: Cardiology

## 2019-04-12 NOTE — Progress Notes (Signed)
Remote ICD transmission.   

## 2019-06-18 DIAGNOSIS — D485 Neoplasm of uncertain behavior of skin: Secondary | ICD-10-CM | POA: Diagnosis not present

## 2019-06-18 DIAGNOSIS — D229 Melanocytic nevi, unspecified: Secondary | ICD-10-CM | POA: Diagnosis not present

## 2019-06-18 DIAGNOSIS — L570 Actinic keratosis: Secondary | ICD-10-CM | POA: Diagnosis not present

## 2019-06-18 DIAGNOSIS — L812 Freckles: Secondary | ICD-10-CM | POA: Diagnosis not present

## 2019-06-18 DIAGNOSIS — L905 Scar conditions and fibrosis of skin: Secondary | ICD-10-CM | POA: Diagnosis not present

## 2019-06-18 DIAGNOSIS — L814 Other melanin hyperpigmentation: Secondary | ICD-10-CM | POA: Diagnosis not present

## 2019-06-18 DIAGNOSIS — L821 Other seborrheic keratosis: Secondary | ICD-10-CM | POA: Diagnosis not present

## 2019-06-18 DIAGNOSIS — L819 Disorder of pigmentation, unspecified: Secondary | ICD-10-CM | POA: Diagnosis not present

## 2019-06-18 DIAGNOSIS — D1801 Hemangioma of skin and subcutaneous tissue: Secondary | ICD-10-CM | POA: Diagnosis not present

## 2019-06-19 ENCOUNTER — Other Ambulatory Visit: Payer: Self-pay

## 2019-06-19 ENCOUNTER — Ambulatory Visit (INDEPENDENT_AMBULATORY_CARE_PROVIDER_SITE_OTHER): Payer: Medicare HMO | Admitting: Family Medicine

## 2019-06-19 ENCOUNTER — Encounter: Payer: Self-pay | Admitting: Family Medicine

## 2019-06-19 VITALS — BP 96/60 | HR 78 | Temp 97.9°F | Ht 72.0 in | Wt 206.2 lb

## 2019-06-19 DIAGNOSIS — E1169 Type 2 diabetes mellitus with other specified complication: Secondary | ICD-10-CM | POA: Diagnosis not present

## 2019-06-19 DIAGNOSIS — I2510 Atherosclerotic heart disease of native coronary artery without angina pectoris: Secondary | ICD-10-CM | POA: Diagnosis not present

## 2019-06-19 DIAGNOSIS — E119 Type 2 diabetes mellitus without complications: Secondary | ICD-10-CM

## 2019-06-19 DIAGNOSIS — J449 Chronic obstructive pulmonary disease, unspecified: Secondary | ICD-10-CM | POA: Diagnosis not present

## 2019-06-19 DIAGNOSIS — E1159 Type 2 diabetes mellitus with other circulatory complications: Secondary | ICD-10-CM | POA: Diagnosis not present

## 2019-06-19 DIAGNOSIS — I2782 Chronic pulmonary embolism: Secondary | ICD-10-CM | POA: Diagnosis not present

## 2019-06-19 DIAGNOSIS — I5022 Chronic systolic (congestive) heart failure: Secondary | ICD-10-CM

## 2019-06-19 DIAGNOSIS — E785 Hyperlipidemia, unspecified: Secondary | ICD-10-CM

## 2019-06-19 DIAGNOSIS — C4441 Basal cell carcinoma of skin of scalp and neck: Secondary | ICD-10-CM | POA: Diagnosis not present

## 2019-06-19 DIAGNOSIS — I10 Essential (primary) hypertension: Secondary | ICD-10-CM | POA: Diagnosis not present

## 2019-06-19 DIAGNOSIS — I152 Hypertension secondary to endocrine disorders: Secondary | ICD-10-CM

## 2019-06-19 LAB — POCT GLYCOSYLATED HEMOGLOBIN (HGB A1C): Hemoglobin A1C: 7.3 % — AB (ref 4.0–5.6)

## 2019-06-19 NOTE — Patient Instructions (Addendum)
Health Maintenance Due  Topic Date Due  . FOOT EXAM - done today 05/30/2019   A1c before you leave  Full bloodwork next time- luckily your last bloodwork looked excellent so we dont need a full panel today

## 2019-06-19 NOTE — Progress Notes (Signed)
Phone [phone]   Subjective:  Carl Wood is a 78 y.o. year old very pleasant male patient who presents for/with See problem oriented charting Chief Complaint  Patient presents with  . Follow-up    Fasting today.   . Diabetes  . Hypertension  . Hyperlipidemia  . COPD  . Atrial Fibrillation   ROS- Denies HA, dizzines, CP, SOB, visual changes. Had some back pain for about 2 months but has finally resolved.   Past Medical History-  Patient Active Problem List   Diagnosis Date Noted  . COPD (chronic obstructive pulmonary disease) ([place]) 12/26/2018    Priority: High  . History of subdural hematoma 11/21/2017    Priority: High  . Chronic atrial fibrillation     Priority: High  . Chronic systolic heart failure ([place]) 08/06/2015    Priority: High  . Former smoker 11/15/2014    Priority: High  . History of cardioembolic cerebrovascular accident (CVA) 12/19/2012    Priority: High  .  ventricular tachycardia-non sustained  02/17/2012    Priority: High  . Diabetes mellitus type II, controlled ([place]) 10/16/2006    Priority: High  . CAD (coronary artery disease) 10/16/2006    Priority: High  . Chronic pulmonary embolism ([place]) 10/16/2006    Priority: High  . OSA (obstructive sleep apnea) 02/19/2016    Priority: Medium  . Carotid artery stenosis 03/17/2015    Priority: Medium  . Implantable cardioverter-defibrillator (ICD) in situ 02/17/2012    Priority: Medium  . History of skin cancer 07/05/2007    Priority: Medium  . Hyperlipidemia associated with type 2 diabetes mellitus ([place]) 10/16/2006    Priority: Medium  . Hypertension associated with diabetes ([place]) 10/16/2006    Priority: Medium  . LBBB (left bundle branch block) 08/06/2015    Priority: Low  . Actinic keratosis 11/15/2014    Priority: Low  . Benign neoplasm of colon 12/11/2012    Priority: Low  . Osteoarthritis of right knee 09/22/2018  . Frontal headache 10/06/2017  . Rhinitis 08/22/2017  . Dyspnea on  exertion 08/22/2017    Medications- reviewed and updated Current Outpatient Medications  Medication Sig Dispense Refill  . atorvastatin (LIPITOR) 80 MG tablet TAKE 1 TABLET BY MOUTH ONCE DAILY AS DIRECTED 90 tablet 2  . budesonide-formoterol (SYMBICORT) 80-4.5 MCG/ACT inhaler Inhale 2 puffs into the lungs 2 (two) times daily. 1 Inhaler 2  . carvedilol (COREG) 6.25 MG tablet TAKE 1 TABLET BY MOUTH TWICE DAILY WITH A MEAL 60 tablet 11  . digoxin (LANOXIN) 0.25 MG tablet Take 1/2 (one-half) tablet by mouth once daily 45 tablet 1  . empagliflozin (JARDIANCE) 10 MG TABS tablet Take 10 mg by mouth daily. 30 tablet 11  . fluticasone (FLONASE) 50 MCG/ACT nasal spray Place 1 spray into both nostrils daily. 16 g 6  . furosemide (LASIX) 20 MG tablet TAKE 1 TABLET BY MOUTH ONCE A WEEK 12 tablet 4  . glimepiride (AMARYL) 4 MG tablet Take 2 tablets (8 mg total) by mouth daily with breakfast. 180 tablet 1  . glucose blood (ACCU-CHEK AVIVA PLUS) test strip TEST BLOOD SUGAR  DAILY 100 each 5  . metFORMIN (GLUCOPHAGE) 1000 MG tablet TAKE 1 TABLET BY MOUTH TWICE DAILY WITH A MEAL. 180 tablet 3  . rivaroxaban (XARELTO) 20 MG TABS tablet Take 1 tablet (20 mg total) by mouth daily. 28 tablet 0  . sacubitril-valsartan (ENTRESTO) 49-51 MG Take 1 tablet by mouth 2 (two) times daily. 60 tablet 11   No current facility-administered  medications for this visit.      Objective:  BP 96/60 (BP Location: Left Arm, Patient Position: Sitting, Cuff Size: Normal)   Pulse 78   Temp 97.9 F (36.6 C) (Oral)   Ht 6' (1.829 m)   Wt 206 lb 3.2 oz (93.5 kg)   SpO2 94%   BMI 27.97 kg/m  Gen: NAD, resting comfortably CV: RRR no murmurs rubs or gallops Lungs: CTAB no crackles, wheeze, rhonchi Abdomen: soft/nontender/nondistended Ext: no edema Skin: warm, dry Neuro: normal speech  Diabetic Foot Exam - Simple   Simple Foot Form Diabetic Foot exam was performed with the following findings: Yes 06/19/2019  1:47 PM  Visual  Inspection No deformities, no ulcerations, no other skin breakdown bilaterally: Yes Sensation Testing Intact to touch and monofilament testing bilaterally: Yes Pulse Check Posterior Tibialis and Dorsalis pulse intact bilaterally: Yes Comments        Assessment and Plan   # Diabetes S: poorly controlled on last check. He is now on Jardiance 10 mg, Glimepiride 4 mg 2 daily, and Metformin 1000 mg BID. We tried to increase exercise.  CBGs- Checking BG at home once daily fasting. Fasting BG has been around 130-135, occasionally 180. Denies hypoglycemic episodes. No missed doses, no side effects.  Exercise and diet- Limiting carb and sugar intake. Not exercising regularly because he has no energy.  Lab Results  Component Value Date   HGBA1C 8.3 (H) 12/26/2018   HGBA1C 7.7 (A) 07/20/2018   HGBA1C 8.2 03/23/2018   A/P:  hopefully improved control- target under 8- update today with POC a1c  #hypertension/CHF S: controlled on Carvediolol 6.25 mg BID and Furosemide 20 mg daily (meds really more for heart issues). Not checking at home.  No missed doses, no side effects. Not adding salt to food.   He is also on entresto for CHF and doing well BP Readings from Last 3 Encounters:  06/19/19 96/60  01/02/19 110/62  12/28/18 96/62  A/P:  Stable. Continue current medications.   #hyperlipidemia/CAD S:  controlled on Atorvastatin 80 mg daily. No missed doses, no side effects.   No chest pain. Stable shortness of rbeath at baseline.  Lab Results  Component Value Date   CHOL 134 09/22/2018   HDL 33.50 (L) 09/22/2018   LDLCALC 76 09/22/2018   LDLDIRECT 79.0 12/26/2018   TRIG 122.0 09/22/2018   CHOLHDL 4 09/22/2018   A/P:  Stable. Continue current medications. Update full lipids next visitwith labs- ideally under 70 but will tolerate as long as under 10  # COPD S:Taking Symbicort 80-4.5 mcg/act. No recent flare ups.  No rescue inhaler use A/P:  Stable. Continue current medications.   # A  Fib/Chronic PE (Cardiologist Dr. Caryl Comes) S: on carvedilol for rate control and Xarelto for anticoagulation.  Also taking Digoxin 0.25 mg 1/2 tablet daily. Denies abnormal bruising or bleeding.  A/P:  Stable. Continue current medications.   Recommended follow up:  14 week follow up and try to get flu shot  Lab/Order associations:   ICD-10-CM   1. Controlled type 2 diabetes mellitus without complication, without long-term current use of insulin (HCC)  E11.9   2. Coronary artery disease involving native coronary artery of native heart without angina pectoris  I25.10   3. Hyperlipidemia associated with type 2 diabetes mellitus ([place])  E11.69    E78.5   4. Hypertension associated with diabetes ([place])  E11.59    I10   5. Chronic obstructive pulmonary disease, unspecified COPD type ([place])  J44.9  6. Chronic systolic heart failure (HCC)  I50.22   7. Other chronic pulmonary embolism without acute cor pulmonale (HCC)  I27.82    Return precautions advised.  [name], MD

## 2019-06-19 NOTE — Addendum Note (Signed)
Addended by: Jasper Loser on: 06/19/2019 02:11 PM   Modules accepted: Orders

## 2019-06-25 ENCOUNTER — Ambulatory Visit: Payer: Medicare HMO

## 2019-07-05 ENCOUNTER — Encounter: Payer: Medicare HMO | Admitting: *Deleted

## 2019-07-06 ENCOUNTER — Telehealth: Payer: Self-pay

## 2019-07-06 NOTE — Telephone Encounter (Signed)
Left message for patient to remind of missed remote transmission.  

## 2019-07-09 ENCOUNTER — Other Ambulatory Visit: Payer: Self-pay | Admitting: Family Medicine

## 2019-07-10 ENCOUNTER — Ambulatory Visit (INDEPENDENT_AMBULATORY_CARE_PROVIDER_SITE_OTHER): Payer: Medicare HMO | Admitting: *Deleted

## 2019-07-10 DIAGNOSIS — I255 Ischemic cardiomyopathy: Secondary | ICD-10-CM

## 2019-07-10 LAB — CUP PACEART REMOTE DEVICE CHECK
Battery Remaining Longevity: 39 mo
Battery Voltage: 2.93 V
Brady Statistic AP VP Percent: 0 %
Brady Statistic AP VS Percent: 0 %
Brady Statistic AS VP Percent: 0 %
Brady Statistic AS VS Percent: 0 %
Brady Statistic RA Percent Paced: 0 %
Brady Statistic RV Percent Paced: 96.98 %
Date Time Interrogation Session: 20200804153042
HighPow Impedance: 78 Ohm
Implantable Lead Implant Date: 20010723
Implantable Lead Implant Date: 20180423
Implantable Lead Location: 753858
Implantable Lead Location: 753860
Implantable Lead Model: 6943
Implantable Pulse Generator Implant Date: 20180423
Lead Channel Impedance Value: 266 Ohm
Lead Channel Impedance Value: 266 Ohm
Lead Channel Impedance Value: 266 Ohm
Lead Channel Impedance Value: 279.525
Lead Channel Impedance Value: 279.525
Lead Channel Impedance Value: 285 Ohm
Lead Channel Impedance Value: 399 Ohm
Lead Channel Impedance Value: 4047 Ohm
Lead Channel Impedance Value: 532 Ohm
Lead Channel Impedance Value: 532 Ohm
Lead Channel Impedance Value: 532 Ohm
Lead Channel Impedance Value: 589 Ohm
Lead Channel Impedance Value: 931 Ohm
Lead Channel Impedance Value: 931 Ohm
Lead Channel Impedance Value: 950 Ohm
Lead Channel Impedance Value: 988 Ohm
Lead Channel Impedance Value: 988 Ohm
Lead Channel Impedance Value: 988 Ohm
Lead Channel Pacing Threshold Amplitude: 1.125 V
Lead Channel Pacing Threshold Amplitude: 1.5 V
Lead Channel Pacing Threshold Pulse Width: 0.4 ms
Lead Channel Pacing Threshold Pulse Width: 1 ms
Lead Channel Sensing Intrinsic Amplitude: 13.625 mV
Lead Channel Sensing Intrinsic Amplitude: 13.625 mV
Lead Channel Setting Pacing Amplitude: 2 V
Lead Channel Setting Pacing Amplitude: 2.5 V
Lead Channel Setting Pacing Pulse Width: 0.4 ms
Lead Channel Setting Pacing Pulse Width: 1 ms
Lead Channel Setting Sensing Sensitivity: 0.3 mV

## 2019-07-13 ENCOUNTER — Encounter: Payer: Self-pay | Admitting: Cardiology

## 2019-07-16 ENCOUNTER — Other Ambulatory Visit: Payer: Self-pay | Admitting: Family Medicine

## 2019-07-18 ENCOUNTER — Encounter: Payer: Self-pay | Admitting: Cardiology

## 2019-07-18 NOTE — Progress Notes (Signed)
Remote ICD transmission.   

## 2019-07-23 DIAGNOSIS — E119 Type 2 diabetes mellitus without complications: Secondary | ICD-10-CM | POA: Diagnosis not present

## 2019-07-23 DIAGNOSIS — D3131 Benign neoplasm of right choroid: Secondary | ICD-10-CM | POA: Diagnosis not present

## 2019-07-23 DIAGNOSIS — H5201 Hypermetropia, right eye: Secondary | ICD-10-CM | POA: Diagnosis not present

## 2019-07-23 LAB — HM DIABETES EYE EXAM

## 2019-07-30 ENCOUNTER — Other Ambulatory Visit: Payer: Self-pay

## 2019-07-30 ENCOUNTER — Ambulatory Visit (INDEPENDENT_AMBULATORY_CARE_PROVIDER_SITE_OTHER): Payer: Medicare HMO | Admitting: Family Medicine

## 2019-07-30 ENCOUNTER — Encounter: Payer: Self-pay | Admitting: Family Medicine

## 2019-07-30 VITALS — BP 104/58 | HR 63 | Temp 97.9°F | Resp 16 | Ht 72.0 in | Wt 207.4 lb

## 2019-07-30 DIAGNOSIS — M62838 Other muscle spasm: Secondary | ICD-10-CM

## 2019-07-30 DIAGNOSIS — M545 Low back pain, unspecified: Secondary | ICD-10-CM

## 2019-07-30 MED ORDER — DICLOFENAC SODIUM 75 MG PO TBEC: 75.0000 mg | DELAYED_RELEASE_TABLET | Freq: Two times a day (BID) | ORAL | 0 refills | Status: DC

## 2019-07-30 MED ORDER — KETOROLAC TROMETHAMINE 60 MG/2ML IM SOLN
60.0000 mg | Freq: Once | INTRAMUSCULAR | Status: AC
  Administered 2019-07-30: 15:00:00 60 mg via INTRAMUSCULAR

## 2019-07-30 MED ORDER — HYDROCODONE-ACETAMINOPHEN 5-325 MG PO TABS: 1.0000 | ORAL_TABLET | Freq: Four times a day (QID) | ORAL | 0 refills | Status: DC | PRN

## 2019-07-30 MED ORDER — CYCLOBENZAPRINE HCL 10 MG PO TABS: 10.0000 mg | ORAL_TABLET | Freq: Three times a day (TID) | ORAL | 0 refills | Status: DC | PRN

## 2019-07-30 NOTE — Patient Instructions (Signed)
Please follow up with Dr. Yong Channel in 2 weeks.   Please start the anti-inflammatory medication twice a day and you may use the muscle relaxer, cyclobenzaprine as needed; be careful, it can make you sleepy.  I've also ordered hydrocodone to use IF needed for severe pain.   Recheck in 1-2 weeks.

## 2019-07-30 NOTE — Progress Notes (Signed)
Subjective  CC:  Chief Complaint  Patient presents with  . Back Pain    Worse yesterday, mostly right hip.Marland Kitchen Has tried AsperCream patches.. Sitting helps   Same day acute visit; PCP not available. New pt to me. Chart reviewed.   HPI: Carl Wood is a 78 y.o. male who presents to the office today to address the problems listed above in the chief complaint.  Pleasant 78 year old male with well-controlled type 2 diabetes presents due to 5 to 6-week history of right low back pain.  Complains of intermittent sharp pains that will catch him with certain movements.  If he sitting still or lying flat he has no pain.  Over the weekend symptoms have increased where pain is now moderate to severe.  Pain is now limiting activity.  Sleep is unaffected.  No radicular symptoms, lower extremity weakness, bowel or bladder dysfunction.  No recent trauma or overuse.  By chart review he does have likely DJD of the low back and right hip.  He denies decreased range of motion of the leg.  He is using topical creams which are no longer helping.  Has not used any oral medications.  Of note he is on Xarelto.  Last A1c was 7.3 Assessment  1. Acute right-sided low back pain without sciatica   2. Muscle spasm      Plan   Right-sided low back pain without sciatica and muscle spasm: Likely musculoskeletal pain due to DJD and recent flare.  Recommend heat, massage, muscle relaxer with caution, NSAIDs with caution and hydrocodone if needed.  Patient will follow-up in 1 to 2 weeks.  Red flags discussed in detail.  See after visit summary.  Follow up: Return in about 2 weeks (around 08/13/2019) for recheck back pain with Dr. Yong Channel.  09/25/2019  No orders of the defined types were placed in this encounter.  Meds ordered this encounter  Medications  . diclofenac (VOLTAREN) 75 MG EC tablet    Sig: Take 1 tablet (75 mg total) by mouth 2 (two) times daily.    Dispense:  30 tablet    Refill:  0  . cyclobenzaprine  (FLEXERIL) 10 MG tablet    Sig: Take 1 tablet (10 mg total) by mouth 3 (three) times daily as needed for muscle spasms.    Dispense:  30 tablet    Refill:  0  . HYDROcodone-acetaminophen (NORCO) 5-325 MG tablet    Sig: Take 1 tablet by mouth every 6 (six) hours as needed for moderate pain or severe pain.    Dispense:  12 tablet    Refill:  0  . ketorolac (TORADOL) injection 60 mg      I reviewed the patients updated PMH, FH, and SocHx.    Patient Active Problem List   Diagnosis Date Noted  . COPD (chronic obstructive pulmonary disease) ([place]) 12/26/2018  . Osteoarthritis of right knee 09/22/2018  . History of subdural hematoma 11/21/2017  . Frontal headache 10/06/2017  . Rhinitis 08/22/2017  . Dyspnea on exertion 08/22/2017  . Chronic atrial fibrillation   . OSA (obstructive sleep apnea) 02/19/2016  . Chronic systolic heart failure ([place]) 08/06/2015  . LBBB (left bundle branch block) 08/06/2015  . Carotid artery stenosis 03/17/2015  . Former smoker 11/15/2014  . Actinic keratosis 11/15/2014  . History of cardioembolic cerebrovascular accident (CVA) 12/19/2012  . Benign neoplasm of colon 12/11/2012  . Implantable cardioverter-defibrillator (ICD) in situ 02/17/2012  .  ventricular tachycardia-non sustained  02/17/2012  . History of  skin cancer 07/05/2007  . Diabetes mellitus type II, controlled ([place]) 10/16/2006  . Hyperlipidemia associated with type 2 diabetes mellitus ([place]) 10/16/2006  . Hypertension associated with diabetes ([place]) 10/16/2006  . CAD (coronary artery disease) 10/16/2006  . Chronic pulmonary embolism ([facility]) 10/16/2006   Current Meds  Medication Sig  . atorvastatin (LIPITOR) 80 MG tablet TAKE 1 TABLET BY MOUTH ONCE DAILY AS DIRECTED  . budesonide-formoterol (SYMBICORT) 80-4.5 MCG/ACT inhaler Inhale 2 puffs into the lungs 2 (two) times daily.  . carvedilol (COREG) 6.25 MG tablet TAKE 1 TABLET BY MOUTH TWICE DAILY WITH A MEAL  . digoxin (LANOXIN) 0.25 MG tablet  Take 1/2 (one-half) tablet by mouth once daily  . fluticasone (FLONASE) 50 MCG/ACT nasal spray Place 1 spray into both nostrils daily.  . furosemide (LASIX) 20 MG tablet TAKE 1 TABLET BY MOUTH ONCE A WEEK  . glimepiride (AMARYL) 4 MG tablet TAKE 2 TABLETS BY MOUTH ONCE DAILY WITH BREAKFAST  . glucose blood (ACCU-CHEK AVIVA PLUS) test strip TEST BLOOD SUGAR  DAILY  . JARDIANCE 10 MG TABS tablet Take 1 tablet by mouth once daily  . metFORMIN (GLUCOPHAGE) 1000 MG tablet TAKE 1 TABLET BY MOUTH TWICE DAILY WITH A MEAL  . rivaroxaban (XARELTO) 20 MG TABS tablet Take 1 tablet (20 mg total) by mouth daily.  . sacubitril-valsartan (ENTRESTO) 49-51 MG Take 1 tablet by mouth 2 (two) times daily.    Allergies: Patient is allergic to penicillins and sulfamethoxazole. Family History: Patient family history includes Bladder Cancer in his mother; Colon cancer in his father; Diabetes in his father; Heart attack (age of onset: 36) in his child; Heart attack (age of onset: 36) in his child; Heart disease in his father and mother. Social History:  Patient  reports that he quit smoking about 6 years ago. His smoking use included cigarettes. He has a 50.00 pack-year smoking history. He has never used smokeless tobacco. He reports that he does not drink alcohol or use drugs.  Review of Systems: Constitutional: Negative for fever malaise or anorexia Cardiovascular: negative for chest pain Respiratory: negative for SOB or persistent cough Gastrointestinal: negative for abdominal pain  Objective  Vitals: BP (!) 104/58   Pulse 63   Temp 97.9 F (36.6 C) (Tympanic)   Resp 16   Ht 6' (1.829 m)   Wt 207 lb 6.4 oz (94.1 kg)   SpO2 94%   BMI 28.13 kg/m  General: no acute distress if sitting still but struggles to get out of a chair due to sharp pains, A&Ox3 HEENT: PEERL, conjunctiva normal, Oropharynx moist,neck is supple Back exam: Well-healed surgical scar in lumbar area, nontender lumbar spine, tender right  not palpable right low back.  Range of motion is decreased due to spasm.  Negative straight leg raise bilaterally.  Normal lower extremity strength.  No sciatic notch tenderness, no SI joint tenderness, no greater trochanteric bursa tenderness. Skin:  Warm, no rashes  Toradol was given in the office.  Patient responded very well.  Pain was significantly decreased and he was moving better.   Commons side effects, risks, benefits, and alternatives for medications and treatment plan prescribed today were discussed, and the patient expressed understanding of the given instructions. Patient is instructed to call or message via MyChart if he/she has any questions or concerns regarding our treatment plan. No barriers to understanding were identified. We discussed Red Flag symptoms and signs in detail. Patient expressed understanding regarding what to do in case of urgent or emergency type  symptoms.   Medication list was reconciled, printed and provided to the patient in AVS. Patient instructions and summary information was reviewed with the patient as documented in the AVS. This note was prepared with assistance of Dragon voice recognition software. Occasional wrong-word or sound-a-like substitutions may have occurred due to the inherent limitations of voice recognition software

## 2019-08-01 ENCOUNTER — Ambulatory Visit: Payer: Medicare HMO | Admitting: Family Medicine

## 2019-08-01 ENCOUNTER — Encounter: Payer: Self-pay | Admitting: Family Medicine

## 2019-08-06 DIAGNOSIS — C4441 Basal cell carcinoma of skin of scalp and neck: Secondary | ICD-10-CM | POA: Diagnosis not present

## 2019-08-08 ENCOUNTER — Other Ambulatory Visit (HOSPITAL_COMMUNITY): Payer: Self-pay | Admitting: Internal Medicine

## 2019-08-10 ENCOUNTER — Other Ambulatory Visit: Payer: Self-pay

## 2019-08-16 ENCOUNTER — Encounter: Payer: Self-pay | Admitting: Family Medicine

## 2019-08-16 ENCOUNTER — Ambulatory Visit (INDEPENDENT_AMBULATORY_CARE_PROVIDER_SITE_OTHER): Payer: Medicare HMO | Admitting: Family Medicine

## 2019-08-16 ENCOUNTER — Ambulatory Visit (INDEPENDENT_AMBULATORY_CARE_PROVIDER_SITE_OTHER): Payer: Medicare HMO

## 2019-08-16 ENCOUNTER — Other Ambulatory Visit: Payer: Self-pay

## 2019-08-16 VITALS — BP 102/60 | HR 95 | Temp 97.7°F | Ht 72.0 in | Wt 204.8 lb

## 2019-08-16 DIAGNOSIS — M545 Low back pain, unspecified: Secondary | ICD-10-CM

## 2019-08-16 DIAGNOSIS — G8929 Other chronic pain: Secondary | ICD-10-CM | POA: Diagnosis not present

## 2019-08-16 DIAGNOSIS — Z23 Encounter for immunization: Secondary | ICD-10-CM

## 2019-08-16 NOTE — Progress Notes (Signed)
Phone [phone]   Subjective:  Carl Wood is a 78 y.o. year old very pleasant male patient who presents for/with See problem oriented charting Chief Complaint  Patient presents with  . Back Pain   ROS-continued low back pain.  No sciatica.  No fever/chills.  No redness over the back or legs.  Past Medical History-  Patient Active Problem List   Diagnosis Date Noted  . COPD (chronic obstructive pulmonary disease) ([place]) 12/26/2018    Priority: High  . History of subdural hematoma 11/21/2017    Priority: High  . Chronic atrial fibrillation     Priority: High  . Chronic systolic heart failure ([place]) 08/06/2015    Priority: High  . Former smoker 11/15/2014    Priority: High  . History of cardioembolic cerebrovascular accident (CVA) 12/19/2012    Priority: High  .  ventricular tachycardia-non sustained  02/17/2012    Priority: High  . Diabetes mellitus type II, controlled ([place]) 10/16/2006    Priority: High  . CAD (coronary artery disease) 10/16/2006    Priority: High  . Chronic pulmonary embolism ([place]) 10/16/2006    Priority: High  . OSA (obstructive sleep apnea) 02/19/2016    Priority: Medium  . Carotid artery stenosis 03/17/2015    Priority: Medium  . Implantable cardioverter-defibrillator (ICD) in situ 02/17/2012    Priority: Medium  . History of skin cancer 07/05/2007    Priority: Medium  . Hyperlipidemia associated with type 2 diabetes mellitus ([place]) 10/16/2006    Priority: Medium  . Hypertension associated with diabetes ([place]) 10/16/2006    Priority: Medium  . LBBB (left bundle branch block) 08/06/2015    Priority: Low  . Actinic keratosis 11/15/2014    Priority: Low  . Benign neoplasm of colon 12/11/2012    Priority: Low  . Osteoarthritis of right knee 09/22/2018  . Frontal headache 10/06/2017  . Rhinitis 08/22/2017  . Dyspnea on exertion 08/22/2017    Medications- reviewed and updated Current Outpatient Medications  Medication Sig Dispense Refill   . atorvastatin (LIPITOR) 80 MG tablet TAKE 1 TABLET BY MOUTH ONCE DAILY AS DIRECTED 90 tablet 2  . budesonide-formoterol (SYMBICORT) 80-4.5 MCG/ACT inhaler Inhale 2 puffs into the lungs 2 (two) times daily. 1 Inhaler 2  . carvedilol (COREG) 6.25 MG tablet TAKE 1 TABLET BY MOUTH TWICE DAILY WITH A MEAL 60 tablet 11  . digoxin (LANOXIN) 0.25 MG tablet Take 1/2 (one-half) tablet by mouth once daily 45 tablet 1  . fluticasone (FLONASE) 50 MCG/ACT nasal spray Place 1 spray into both nostrils daily. 16 g 6  . furosemide (LASIX) 20 MG tablet TAKE 1 TABLET BY MOUTH ONCE A WEEK 12 tablet 4  . glimepiride (AMARYL) 4 MG tablet TAKE 2 TABLETS BY MOUTH ONCE DAILY WITH BREAKFAST 180 tablet 0  . glucose blood (ACCU-CHEK AVIVA PLUS) test strip TEST BLOOD SUGAR  DAILY 100 each 5  . JARDIANCE 10 MG TABS tablet Take 1 tablet by mouth once daily 90 tablet 3  . metFORMIN (GLUCOPHAGE) 1000 MG tablet TAKE 1 TABLET BY MOUTH TWICE DAILY WITH A MEAL 180 tablet 0  . sacubitril-valsartan (ENTRESTO) 49-51 MG Take 1 tablet by mouth 2 (two) times daily. 60 tablet 11  . XARELTO 20 MG TABS tablet Take 1 tablet by mouth once daily 30 tablet 0   No current facility-administered medications for this visit.      Objective:  BP 102/60 (BP Location: Left Arm, Patient Position: Sitting, Cuff Size: Normal)   Pulse 95  Temp 97.7 F (36.5 C) (Temporal)   Ht 6' (1.829 m)   Wt 204 lb 12.8 oz (92.9 kg)   SpO2 95%   BMI 27.78 kg/m  Gen: NAD, resting comfortably CV: RRR no murmurs rubs or gallops Lungs: CTAB no crackles, wheeze, rhonchi Abdomen: soft/nontender/nondistended/normal bowel sounds.  Ext: no edema Skin: warm, dry MSK: Patient does seem somewhat tight in musculature right low back but without pain with palpation.  Negative straight leg raise.    Assessment and Plan   # Right-sided LBP S:Sx x 6-7 weeks. Described as intermittent sharp pains, catching with certain movements. Alleviated with sitting or lying supine.  At times pain limits activity. Pain does not interrupt sleep. Taking Cyclobenzaprine prn. Has tried topical analgesics with minimal relief. Denies radiating pain, weakness, bowel/bladder inconvenience, trauma.   Sx have improved but not resolved. Pain was up to over 10/10- felt like it would bring him to his knees when he would move. He was given a shot of toradol. He states never took hydrocodone.  Minimal relief with Cyclobenzaprine and heat.    Pain level down to 1/10 most of the time but still gets spikes with movement up to 5/10.  seems to bother him in low back and radiates across the back- seems to be worse with walking. Worst over posterior portion of right hip. He did take voltaren- luckily no bleeding issues- he is now off voltaren  Low back surgery years ago over 45 yers ago A/P: 78 year old male with over 6 weeks of low back pain.  Has had significant improvement but without full resolution.  History of low back surgery-though he does not know the specifics.  We will check imaging of low back. He has finished Voltaren and did not have any bleeding issues- we discussed ideally would use sparingly or not at all in the future due to history of subdural hematoma as well as cardioembolic cerebrovascular accident-he understands but his pain was so severe he thought the benefits outweighed risks at the time.  From AVS "  Glad pain is improving. If x-ray is ok lets give this another 2-3 weeks to see if continues to improve. If it does not or it worsens- let me know and I can get you in either with orthopedics office Dr. Junius Roads or sports medicine Dr. Tamala Julian "  Recommended follow up: already scheduled next month Future Appointments  Date Time Provider [place]  09/03/2019  2:00 PM [facility]-CT 1 [facility]-CT [facility]  09/11/2019  1:40 PM Bensimhon, Shaune Pascal, MD [facility] None  09/25/2019 10:00 AM Marin Olp, MD [facility]  10/09/2019  7:20 AM [facility] DEVICE REMOTES [facility]  01/08/2020  7:20 AM [facility] DEVICE REMOTES [facility]  04/08/2020  7:20 AM [facility] DEVICE REMOTES [facility]  07/08/2020  7:20 AM [facility] DEVICE REMOTES [facility]  10/07/2020  7:20 AM [facility] DEVICE REMOTES [facility]    Lab/Order associations:   ICD-10-CM   1. Chronic right-sided low back pain without sciatica  M54.5 DG Lumbar Spine Complete   G89.29    Return precautions advised.  [name], MD

## 2019-08-16 NOTE — Patient Instructions (Addendum)
Health Maintenance Due  Topic Date Due  . INFLUENZA VACCINE - today 07/07/2019   Please stop by x-ray before you go If you do not have mychart- we will call you about results within 5 business days of [place] receiving them.  If you have mychart- we will send your results within 3 business days of [place] receiving them.  If abnormal or we want to clarify a result, we will call or mychart you to make sure you receive the message.  If you have questions or concerns or don't hear within 5-7 days, please send [place] a message or call us.   Glad pain is improving. If x-ray is ok lets give this another 2-3 weeks to see if continues to improve. If it does not or it worsens- let me know and I can get you in either with orthopedics office Dr. Junius Roads or sports medicine Dr. Tamala Julian

## 2019-08-16 NOTE — Addendum Note (Signed)
Addended by: Jasper Loser on: 08/16/2019 07:21 PM   Modules accepted: Orders

## 2019-08-21 ENCOUNTER — Telehealth: Payer: Self-pay | Admitting: Family Medicine

## 2019-08-21 NOTE — Telephone Encounter (Signed)
I left a message asking the patient to call me at [phone] to schedule AWV with Courtney. VDM (Dee-Dee) °

## 2019-09-03 ENCOUNTER — Other Ambulatory Visit: Payer: Self-pay

## 2019-09-03 ENCOUNTER — Ambulatory Visit (INDEPENDENT_AMBULATORY_CARE_PROVIDER_SITE_OTHER)
Admission: RE | Admit: 2019-09-03 | Discharge: 2019-09-03 | Disposition: A | Discharge status: Home / Self Care | Payer: Medicare HMO | Source: Ambulatory Visit | Attending: Acute Care | Admitting: Acute Care

## 2019-09-03 ENCOUNTER — Other Ambulatory Visit: Payer: Self-pay | Admitting: Cardiology

## 2019-09-03 ENCOUNTER — Other Ambulatory Visit (HOSPITAL_COMMUNITY): Payer: Self-pay | Admitting: Internal Medicine

## 2019-09-03 DIAGNOSIS — Z87891 Personal history of nicotine dependence: Secondary | ICD-10-CM

## 2019-09-03 DIAGNOSIS — Z122 Encounter for screening for malignant neoplasm of respiratory organs: Secondary | ICD-10-CM

## 2019-09-06 ENCOUNTER — Telehealth: Payer: Self-pay | Admitting: Acute Care

## 2019-09-06 DIAGNOSIS — Z122 Encounter for screening for malignant neoplasm of respiratory organs: Secondary | ICD-10-CM

## 2019-09-06 DIAGNOSIS — Z87891 Personal history of nicotine dependence: Secondary | ICD-10-CM

## 2019-09-06 NOTE — Telephone Encounter (Signed)
[place] x 1 for lung screening results

## 2019-09-07 NOTE — Telephone Encounter (Signed)
Patient called back - he can be reached at [phone]

## 2019-09-11 ENCOUNTER — Encounter (HOSPITAL_COMMUNITY): Payer: Self-pay | Admitting: Internal Medicine

## 2019-09-11 ENCOUNTER — Ambulatory Visit (HOSPITAL_COMMUNITY)
Admission: RE | Admit: 2019-09-11 | Discharge: 2019-09-11 | Disposition: A | Discharge status: Home / Self Care | Payer: Medicare HMO | Source: Ambulatory Visit | Attending: Internal Medicine | Admitting: Internal Medicine

## 2019-09-11 ENCOUNTER — Other Ambulatory Visit: Payer: Self-pay

## 2019-09-11 VITALS — BP 109/58 | HR 82 | Wt 205.6 lb

## 2019-09-11 DIAGNOSIS — G4733 Obstructive sleep apnea (adult) (pediatric): Secondary | ICD-10-CM | POA: Insufficient documentation

## 2019-09-11 DIAGNOSIS — E119 Type 2 diabetes mellitus without complications: Secondary | ICD-10-CM | POA: Insufficient documentation

## 2019-09-11 DIAGNOSIS — I482 Chronic atrial fibrillation, unspecified: Secondary | ICD-10-CM | POA: Diagnosis not present

## 2019-09-11 DIAGNOSIS — Z8673 Personal history of transient ischemic attack (TIA), and cerebral infarction without residual deficits: Secondary | ICD-10-CM | POA: Insufficient documentation

## 2019-09-11 DIAGNOSIS — Z7984 Long term (current) use of oral hypoglycemic drugs: Secondary | ICD-10-CM | POA: Insufficient documentation

## 2019-09-11 DIAGNOSIS — I2510 Atherosclerotic heart disease of native coronary artery without angina pectoris: Secondary | ICD-10-CM | POA: Diagnosis not present

## 2019-09-11 DIAGNOSIS — Z8249 Family history of ischemic heart disease and other diseases of the circulatory system: Secondary | ICD-10-CM | POA: Insufficient documentation

## 2019-09-11 DIAGNOSIS — I252 Old myocardial infarction: Secondary | ICD-10-CM | POA: Diagnosis not present

## 2019-09-11 DIAGNOSIS — Z87891 Personal history of nicotine dependence: Secondary | ICD-10-CM | POA: Diagnosis not present

## 2019-09-11 DIAGNOSIS — Z95810 Presence of automatic (implantable) cardiac defibrillator: Secondary | ICD-10-CM | POA: Insufficient documentation

## 2019-09-11 DIAGNOSIS — Z86711 Personal history of pulmonary embolism: Secondary | ICD-10-CM | POA: Insufficient documentation

## 2019-09-11 DIAGNOSIS — I5022 Chronic systolic (congestive) heart failure: Secondary | ICD-10-CM | POA: Diagnosis not present

## 2019-09-11 DIAGNOSIS — I110 Hypertensive heart disease with heart failure: Secondary | ICD-10-CM | POA: Diagnosis not present

## 2019-09-11 DIAGNOSIS — E785 Hyperlipidemia, unspecified: Secondary | ICD-10-CM | POA: Diagnosis not present

## 2019-09-11 DIAGNOSIS — Z7901 Long term (current) use of anticoagulants: Secondary | ICD-10-CM | POA: Diagnosis not present

## 2019-09-11 DIAGNOSIS — Z79899 Other long term (current) drug therapy: Secondary | ICD-10-CM | POA: Diagnosis not present

## 2019-09-11 DIAGNOSIS — I4821 Permanent atrial fibrillation: Secondary | ICD-10-CM | POA: Diagnosis not present

## 2019-09-11 LAB — BASIC METABOLIC PANEL
Anion gap: 12 (ref 5–15)
BUN: 17 mg/dL (ref 8–23)
CO2: 24 mmol/L (ref 22–32)
Calcium: 9.8 mg/dL (ref 8.9–10.3)
Chloride: 102 mmol/L (ref 98–111)
Creatinine, Ser: 1.03 mg/dL (ref 0.61–1.24)
GFR calc Af Amer: >60 mL/min (ref >60)
GFR calc non Af Amer: >60 mL/min (ref >60)
Glucose, Bld: 252 mg/dL — ABNORMAL HIGH (ref 70–99)
Potassium: 4.4 mmol/L (ref 3.5–5.1)
Sodium: 138 mmol/L (ref 135–145)

## 2019-09-11 LAB — BRAIN NATRIURETIC PEPTIDE: B Natriuretic Peptide: 89.9 pg/mL (ref 0.0–100.0)

## 2019-09-11 MED ORDER — CARVEDILOL 6.25 MG PO TABS: 9.3750 mg | ORAL_TABLET | Freq: Two times a day (BID) | ORAL | 6 refills | Status: DC

## 2019-09-11 NOTE — Patient Instructions (Signed)
Increase Carvedilol to 9.375 mg (1 & 1/2 tabs) Twice daily   Labs done today, we will call you for any abnormal resutls  Your physician recommends that you schedule a follow-up appointment in: 4 months with echocardiogram  If you have any questions or concerns before your next appointment please send [place] a message through [place] or call our office at [phone].  At the [facility], you and your health needs are our priority. As part of our continuing mission to provide you with exceptional heart care, we have created designated Provider Care Teams. These Care Teams include your primary Cardiologist (physician) and Advanced Practice Providers (APPs- Physician Assistants and Nurse Practitioners) who all work together to provide you with the care you need, when you need it.   You may see any of the following providers on your designated Care Team at your next follow up: Marland Kitchen Dr Glori Bickers . Dr Loralie Champagne . Darrick Grinder, NP   Please be sure to bring in all your medications bottles to every appointment.

## 2019-09-11 NOTE — Progress Notes (Signed)
Medication Samples have been provided to the patient.  Drug name: Xarelto       Strength: 20mg         Qty: 3  LOT: 18MG [id number]  Exp.Date: 07/2020  Dosing instructions: Take 1 tab daily  The patient has been instructed regarding the correct time, dose, and frequency of taking this medication, including desired effects and most common side effects.   Bryson Gavia 2:08 PM 09/11/2019

## 2019-09-11 NOTE — Progress Notes (Signed)
[facility] Note    Date:  09/11/2019   ID:  [name], DOB November 11, 1941, MRN [id number]  Location: Home  Provider location: [address] [place] Type of Visit: Established patient PCP:  Marin Olp, MD  Cardiologist:  No primary care provider on file. Primary HF: Dr Haroldine Laws   Chief Complaint: Heart Failure   History of Present Illness: Carl Wood is a 78 y.o. male who presents via audio conferencing for a telehealth visit today.     Mr Carl Wood is a 78 year old with a history of CAD s/p MI 1983, ICM with chronic systolic heart failure s/p Medtronic single chamber ICD (EF 20% on last echo 8/19), CVA 2014, DM , VT, PE, HTN, permanent A fib on Xarelto, SDH 2019,  OSA, and former smoker.   Had CRT-D upgrade in April 2018.   In 12/18 saw Dr. Tomi Likens for chronic HA. Found to have small SDHs that were felt to be chronic after a previous fall when trimming vines. Xarelto stopped. F/u C 2/19 SDHs resolved. Xarelto restarted in 2/19  Here for routine f/u. Says he feels great. Working in yard and working at [employer] without any problem. Spent 4 hours pressure washing his porch yesterday. No CP or SOB. No edema, orthopnea or PND. No problems with medicine. No bleeding with Xarelto.   Past Medical History:  Diagnosis Date  . CAD (coronary artery disease)   . CHF (congestive heart failure) ([place])   . Chronic atrial fibrillation   . Colon polyps   . Diabetes mellitus   . Diverticulosis of colon   . DIVERTICULOSIS, COLON 10/16/2006   Qualifier: Diagnosis of  By: Leanne Chang MD, [place]    . Excessive daytime sleepiness 02/19/2016  . Hyperlipidemia   . Hypertension   . MI (mitral incompetence)   . Nephrolithiasis   . NEPHROLITHIASIS 06/26/2008   Qualifier: Diagnosis of  By: Sherlynn Stalls, CMA, [place]    . OSA (obstructive sleep apnea) 02/19/2016   Moderate to severe OSA with an AHI of 25/hr and on CPAP at 8cm H2O  . PE (pulmonary embolism)   . V-tach  [facility])    Past Surgical History:  Procedure Laterality Date  . BACK SURGERY    . BASAL CELL CARCINOMA EXCISION     nose  . BIV UPGRADE N/A 03/28/2017   Procedure: BiV Upgrade;  Surgeon: Deboraha Sprang, MD;  Location: [facility];  Service: Cardiovascular;  Laterality: N/A;  . CARDIAC CATHETERIZATION N/A 01/20/2016   Procedure: Right/Left Heart Cath and Coronary Angiography;  Surgeon: Jolaine Artist, MD;  Location: [place] CV LAB;  Service: Cardiovascular;  Laterality: N/A;  . CARDIAC DEFIBRILLATOR PLACEMENT     medtronic virtuoso  . CHOLECYSTECTOMY    . COLONOSCOPY  12/11/2012   Procedure: COLONOSCOPY;  Surgeon: Inda Castle, MD;  Location: [facility];  Service: Endoscopy;  Laterality: N/A;  . KNEE ARTHROPLASTY       Current Outpatient Medications  Medication Sig Dispense Refill  . atorvastatin (LIPITOR) 80 MG tablet TAKE 1 TABLET BY MOUTH ONCE DAILY AS DIRECTED 90 tablet 2  . budesonide-formoterol (SYMBICORT) 80-4.5 MCG/ACT inhaler Inhale 2 puffs into the lungs 2 (two) times daily. 1 Inhaler 2  . carvedilol (COREG) 6.25 MG tablet TAKE 1 TABLET BY MOUTH TWICE DAILY WITH A MEAL 60 tablet 0  . digoxin (LANOXIN) 0.25 MG tablet Take 1/2 (one-half) tablet by mouth once daily 45 tablet 1  .  fluticasone (FLONASE) 50 MCG/ACT nasal spray Place 1 spray into both nostrils daily. 16 g 6  . furosemide (LASIX) 20 MG tablet TAKE 1 TABLET BY MOUTH ONCE A WEEK 12 tablet 4  . glimepiride (AMARYL) 4 MG tablet TAKE 2 TABLETS BY MOUTH ONCE DAILY WITH BREAKFAST 180 tablet 0  . glucose blood (ACCU-CHEK AVIVA PLUS) test strip TEST BLOOD SUGAR  DAILY 100 each 5  . JARDIANCE 10 MG TABS tablet Take 1 tablet by mouth once daily 90 tablet 3  . metFORMIN (GLUCOPHAGE) 1000 MG tablet TAKE 1 TABLET BY MOUTH TWICE DAILY WITH A MEAL 180 tablet 0  . sacubitril-valsartan (ENTRESTO) 49-51 MG Take 1 tablet by mouth 2 (two) times daily. 60 tablet 11  . XARELTO 20 MG TABS tablet Take 1 tablet by mouth once  daily 30 tablet 0   No current facility-administered medications for this encounter.     Allergies:   Penicillins and Sulfamethoxazole   Social History:  The patient  reports that he quit smoking about 6 years ago. His smoking use included cigarettes. He has a 50.00 pack-year smoking history. He has never used smokeless tobacco. He reports that he does not drink alcohol or use drugs.   Family History:  The patient's family history includes Bladder Cancer in his mother; Colon cancer in his father; Diabetes in his father; Heart attack (age of onset: 75) in his child; Heart attack (age of onset: 109) in his child; Heart disease in his father and mother.   ROS:  Please see the history of present illness.   All other systems are personally reviewed and negative.   Vitals:   09/11/19 1345  BP: (!) 109/58  Pulse: 82  SpO2: 97%    Exam:  General:  Well appearing. No resp difficulty HEENT: normal Neck: supple. no JVD. Carotids 2+ bilat; no bruits. No lymphadenopathy or thryomegaly appreciated. Cor: PMI nondisplaced. Regular rate & rhythm. No rubs, gallops or murmurs. Lungs: clear Abdomen: soft, nontender, nondistended. No hepatosplenomegaly. No bruits or masses. Good bowel sounds. Extremities: no cyanosis, clubbing, rash, edema Neuro: alert & orientedx3, cranial nerves grossly intact. moves all 4 extremities w/o difficulty. Affect pleasant edx3, cranial nerves grossly intact. moves all 4 extremities w/o difficulty. Affect pleasant  ECG: Probable NSR at 80  with v pacing. Personally reviewed   Recent Labs: 12/26/2018: ALT 12; BUN 16; Creatinine, Ser 0.87; Hemoglobin 15.2; Platelets 194.0; Potassium 4.7; Sodium 142  Personally reviewed   Wt Readings from Last 3 Encounters:  08/16/19 92.9 kg (204 lb 12.8 oz)  07/30/19 94.1 kg (207 lb 6.4 oz)  06/19/19 93.5 kg (206 lb 3.2 oz)    ASSESSMENT AND PLAN:  1. Chronic Systolic HF: ICM, s/p Medtronic Bi-V ICD (upgrade 4/18). Echo 09/2016 EF  25-30%. ECHO 8/19 EF 20% - Doing very well NYHA II - CPX testing on 5/18 pVO2 down 14.5->13.0 but he feels better and is able to do all ADLs and his yard work without too much difficulty - Overall stable. If deteriorates any - would consider repeat CPX.  - Will continue to follow closely. If he gets worse can consider VAD (RV ok on echo) - age permitting - Weight and volume status stable - Will try to increase carvedilol to 9.375 bid as tolerated - Continue Entresto 49/51 mg BID. Was previosuly on full dose but had to cut back.  - Continue Jardiance - No Spiro with history of hyperkalemia and soft BP.  - ICD interrogated in clinic volume up/down but  now down. No VT/AF. Activity 2hr/day. 100% biv pacing   2. OSA - Continue CPAP. Follows with Dr. Radford Pax  3. Chronic A fib - ECG today looks like it may be NSR with v-pacing (underlying rhythm hard to assess)  - Rate controlled. Xarelto restarted after resolution SDH. Tolerating well. No bleeding  4. CAD: - Has known distal LAD lesion 95% and mid LAD 40%. No benefit to revascularization given wall motion abnormality.  - No s/s ischemia  Continue statin. Off ASA with Xarelto     Signed, Glori Bickers, MD  1:46 PM   [facility] [address] [facility] [place] [address] [phone] (office) [phone] (fax)

## 2019-09-11 NOTE — Telephone Encounter (Signed)
Pt informed of CT results per Sarah Groce, NP.  PT verbalized understanding.  Copy sent to PCP.  Order placed for 1 yr f/u CT.  

## 2019-09-18 NOTE — Patient Instructions (Addendum)
January 21st or later for physical  No changes today unless a1c is above 8  Please stop by lab before you go If you do not have mychart- we will call you about results within 5 business days of [place] receiving them.  If you have mychart- we will send your results within 3 business days of [place] receiving them.  If abnormal or we want to clarify a result, we will call or mychart you to make sure you receive the message.  If you have questions or concerns or don't hear within 5-7 days, please send [place] a message or call us.

## 2019-09-18 NOTE — Progress Notes (Signed)
Phone [phone]   Subjective:  Carl Wood is a 78 y.o. year old very pleasant male patient who presents for/with See problem oriented charting Chief Complaint  Patient presents with  . Follow-up- diabetes   ROS- some flushing  In face in AM- can last all day or a few hours happens rnadomly. No worsening shortness of breath. No chest pain. No edema. No fever/chills   Past Medical History-  Patient Active Problem List   Diagnosis Date Noted  . COPD (chronic obstructive pulmonary disease) ([place]) 12/26/2018    Priority: High  . History of subdural hematoma 11/21/2017    Priority: High  . Chronic atrial fibrillation (HCC)     Priority: High  . Chronic systolic heart failure ([place]) 08/06/2015    Priority: High  . Former smoker 11/15/2014    Priority: High  . History of cardioembolic cerebrovascular accident (CVA) 12/19/2012    Priority: High  .  ventricular tachycardia-non sustained  02/17/2012    Priority: High  . Diabetes mellitus type II, controlled ([place]) 10/16/2006    Priority: High  . CAD (coronary artery disease) 10/16/2006    Priority: High  . Chronic pulmonary embolism ([place]) 10/16/2006    Priority: High  . OSA (obstructive sleep apnea) 02/19/2016    Priority: Medium  . Carotid artery stenosis 03/17/2015    Priority: Medium  . Implantable cardioverter-defibrillator (ICD) in situ 02/17/2012    Priority: Medium  . History of skin cancer 07/05/2007    Priority: Medium  . Hyperlipidemia associated with type 2 diabetes mellitus ([place]) 10/16/2006    Priority: Medium  . Hypertension associated with diabetes ([place]) 10/16/2006    Priority: Medium  . LBBB (left bundle branch block) 08/06/2015    Priority: Low  . Actinic keratosis 11/15/2014    Priority: Low  . Benign neoplasm of colon 12/11/2012    Priority: Low  . Osteoarthritis of right knee 09/22/2018  . Frontal headache 10/06/2017  . Rhinitis 08/22/2017  . Dyspnea on exertion 08/22/2017    Medications-  reviewed and updated Current Outpatient Medications  Medication Sig Dispense Refill  . atorvastatin (LIPITOR) 80 MG tablet TAKE 1 TABLET BY MOUTH ONCE DAILY AS DIRECTED 90 tablet 2  . budesonide-formoterol (SYMBICORT) 80-4.5 MCG/ACT inhaler Inhale 2 puffs into the lungs 2 (two) times daily. 1 Inhaler 2  . carvedilol (COREG) 6.25 MG tablet TAKE 1 TABLET BY MOUTH TWICE DAILY WITH A MEAL 60 tablet 0  . digoxin (LANOXIN) 0.25 MG tablet Take 1/2 (one-half) tablet by mouth once daily 45 tablet 1  . fluticasone (FLONASE) 50 MCG/ACT nasal spray Place 1 spray into both nostrils daily. 16 g 6  . furosemide (LASIX) 20 MG tablet TAKE 1 TABLET BY MOUTH ONCE A WEEK 12 tablet 4  . glimepiride (AMARYL) 4 MG tablet TAKE 2 TABLETS BY MOUTH ONCE DAILY WITH BREAKFAST 180 tablet 0  . glucose blood (ACCU-CHEK AVIVA PLUS) test strip TEST BLOOD SUGAR  DAILY 100 each 5  . JARDIANCE 10 MG TABS tablet Take 1 tablet by mouth once daily 90 tablet 3  . metFORMIN (GLUCOPHAGE) 1000 MG tablet TAKE 1 TABLET BY MOUTH TWICE DAILY WITH A MEAL 180 tablet 0  . sacubitril-valsartan (ENTRESTO) 49-51 MG Take 1 tablet by mouth 2 (two) times daily. 60 tablet 11  . XARELTO 20 MG TABS tablet Take 1 tablet by mouth once daily 30 tablet 0   No current facility-administered medications for this visit.      Objective:  BP (!) 94/58 (BP  Location: Left Arm, Patient Position: Sitting, Cuff Size: Large)   Pulse 100   Temp 97.9 F (36.6 C) (Temporal)   Ht 6' (1.829 m)   Wt 207 lb (93.9 kg)   SpO2 95%   BMI 28.07 kg/m  Gen: NAD, resting comfortably CV: RRR no murmurs rubs or gallops Lungs: CTAB no crackles, wheeze, rhonchi Abdomen: soft/nontender/nondistended/normal bowel sounds.  Skin: warm, dry Neuro: grossly normal, moves all extremities Msk: some pain with leaning forward, standing up in lower back    Assessment and Plan   #Right-sided low back pain S: Patient presented at last visit with 6 weeks of low back pain-x-ray showed  moderate degenerative disc disease of lumbar spine.  He had used a short course of Voltaren-we discussed trying to avoid NSAIDs if possible due to subdural hematoma history as well as cardioembolic cerebrovascular accident.  He reports today still having some pain but essentially back to baseline from prior flare up  A/P: improved- continue to monitor . Pain related to arthritis most likely.   # Diabetes S: Reasonably controlled on Jardiance 10 mg, glimepiride 8 mg, Metformin 1000 mg twice a day. Urinates every 2 hours including at night CBGs- last visit fasting blood sugars around 1 30-1 35 but as high as 180.  Today reports 125-170 range  Denies low blood sugars  Exercise and diet- doing housework and yardwork to remain active Lab Results  Component Value Date   HGBA1C 7.3 (A) 06/19/2019   HGBA1C 8.3 (H) 12/26/2018   HGBA1C 7.7 (A) 07/20/2018   A/P: A1c goal 8 or less-update A1c today. Likely continue current meds - q 2-3 hours urination.   #hypertension/CHF S: controlled on carvedilol 9.5 (1.5 tablets increased by Dr. Haroldine Laws)  mg twice a day, furosemide 20 mg-primarily takes for CHF as obviously blood pressure runs low.  Patient is also on Entresto for CHF  Denies recent weight gain or edema or increasing shortness of breath  BP Readings from Last 3 Encounters:  09/25/19 (!) 94/58  09/25/19 (!) 94/58  09/11/19 (!) 109/58  A/P: Blood pressure running low he is normal for patient-needs to continue medications for CHF history-CHF appears stable-continue current medications   #hyperlipidemia/CAD S: Reasonably well controlled on atorvastatin 80 mg-LDL slightly high last check at 79 but did not feel strongly about adding Zetia   CAD-denies chest pain.  Shortness of breath stable at baseline-has known COPD  Lab Results  Component Value Date   CHOL 134 09/22/2018   HDL 33.50 (L) 09/22/2018   LDLCALC 76 09/22/2018   LDLDIRECT 79.0 12/26/2018   TRIG 122.0 09/22/2018   CHOLHDL 4  09/22/2018   A/P:  Stable x2. Continue current medications.  Update lipid panel   #COPD-stable on Symbicort.  Denies recent flareup  #chroni Atrial fibrillation/chronic PE-follows with Dr. Caryl Comes.  Patient on carvedilol for rate control and Xarelto for anticoagulation.  He is also on digoxin.  We will continue current medications.   #pacemaker in place with defibrillator with a fib history as well as CAD/CHF  #carotid stenosis- 1-39% bilateral in 03/19/15. Offered repeat but he declines for now. contineu risk factor modification.   #intermittent flushing- wants tsh with labs   Recommended follow up: Physical sometime after January 21 Future Appointments  Date Time Provider [place]  10/09/2019  7:20 AM [facility] DEVICE REMOTES [facility]  12/12/2019  1:15 PM [facility] ECHO OP 2 [facility]  12/12/2019  2:20 PM Bensimhon, Shaune Pascal, MD [facility] None  01/08/2020  7:20  AM [facility] DEVICE REMOTES [facility]  04/08/2020  7:20 AM [facility] DEVICE REMOTES [facility]  07/08/2020  7:20 AM [facility] DEVICE REMOTES [facility]  10/07/2020  7:20 AM [facility] DEVICE REMOTES [facility]    Lab/Order associations:   ICD-10-CM   1. Controlled type 2 diabetes mellitus without complication, without long-term current use of insulin (HCC)  E11.9 CBC with Differential/Platelet    Comprehensive metabolic panel    Hemoglobin A1c    Lipid panel  2. Chronic systolic heart failure (HCC)  I50.22   3. Other chronic pulmonary embolism without acute cor pulmonale (HCC)  I27.82   4. Chronic atrial fibrillation (HCC)  I48.20   5. Coronary artery disease involving native coronary artery of native heart without angina pectoris  I25.10   6. Hypertension associated with diabetes ([place])  E11.59    I10   7. Hyperlipidemia associated with type 2 diabetes mellitus (HCC)  E11.69    E78.5   8. Bilateral carotid artery stenosis  I65.23    Return  precautions advised.  [name], MD

## 2019-09-20 ENCOUNTER — Telehealth: Payer: Self-pay | Admitting: Family Medicine

## 2019-09-20 NOTE — Telephone Encounter (Signed)
I left a message asking the patient to call and schedule Medicare AWV with [name] ([place]) on 09/25/2019 after seeing Dr. Yong Channel.  Im waiting for a call back to either confirm or decline the appointment. If patient calls back, please update appointment notes.  VDM ([place])

## 2019-09-24 ENCOUNTER — Other Ambulatory Visit: Payer: Self-pay | Admitting: Cardiology

## 2019-09-24 ENCOUNTER — Other Ambulatory Visit: Payer: Self-pay | Admitting: Internal Medicine

## 2019-09-25 ENCOUNTER — Ambulatory Visit (INDEPENDENT_AMBULATORY_CARE_PROVIDER_SITE_OTHER): Payer: Medicare HMO | Admitting: Family Medicine

## 2019-09-25 ENCOUNTER — Ambulatory Visit: Payer: Medicare HMO | Admitting: Family Medicine

## 2019-09-25 ENCOUNTER — Other Ambulatory Visit: Payer: Self-pay | Admitting: Internal Medicine

## 2019-09-25 ENCOUNTER — Other Ambulatory Visit: Payer: Self-pay

## 2019-09-25 ENCOUNTER — Encounter: Payer: Self-pay | Admitting: Family Medicine

## 2019-09-25 ENCOUNTER — Ambulatory Visit (INDEPENDENT_AMBULATORY_CARE_PROVIDER_SITE_OTHER): Payer: Medicare HMO

## 2019-09-25 VITALS — BP 94/58 | Temp 97.9°F | Ht 72.0 in | Wt 207.0 lb

## 2019-09-25 VITALS — BP 94/58 | HR 100 | Temp 97.9°F | Ht 72.0 in | Wt 207.0 lb

## 2019-09-25 DIAGNOSIS — E1169 Type 2 diabetes mellitus with other specified complication: Secondary | ICD-10-CM

## 2019-09-25 DIAGNOSIS — I110 Hypertensive heart disease with heart failure: Secondary | ICD-10-CM | POA: Diagnosis not present

## 2019-09-25 DIAGNOSIS — E785 Hyperlipidemia, unspecified: Secondary | ICD-10-CM | POA: Diagnosis not present

## 2019-09-25 DIAGNOSIS — I482 Chronic atrial fibrillation, unspecified: Secondary | ICD-10-CM

## 2019-09-25 DIAGNOSIS — I10 Essential (primary) hypertension: Secondary | ICD-10-CM

## 2019-09-25 DIAGNOSIS — I2782 Chronic pulmonary embolism: Secondary | ICD-10-CM | POA: Diagnosis not present

## 2019-09-25 DIAGNOSIS — Z0000 Encounter for general adult medical examination without abnormal findings: Secondary | ICD-10-CM

## 2019-09-25 DIAGNOSIS — I6523 Occlusion and stenosis of bilateral carotid arteries: Secondary | ICD-10-CM | POA: Diagnosis not present

## 2019-09-25 DIAGNOSIS — J449 Chronic obstructive pulmonary disease, unspecified: Secondary | ICD-10-CM | POA: Diagnosis not present

## 2019-09-25 DIAGNOSIS — I152 Hypertension secondary to endocrine disorders: Secondary | ICD-10-CM

## 2019-09-25 DIAGNOSIS — I2510 Atherosclerotic heart disease of native coronary artery without angina pectoris: Secondary | ICD-10-CM | POA: Diagnosis not present

## 2019-09-25 DIAGNOSIS — I5022 Chronic systolic (congestive) heart failure: Secondary | ICD-10-CM

## 2019-09-25 DIAGNOSIS — E119 Type 2 diabetes mellitus without complications: Secondary | ICD-10-CM

## 2019-09-25 DIAGNOSIS — E1159 Type 2 diabetes mellitus with other circulatory complications: Secondary | ICD-10-CM

## 2019-09-25 LAB — CBC WITH DIFFERENTIAL/PLATELET
Basophils Absolute: 0.1 10*3/uL (ref 0.0–0.1)
Basophils Relative: 0.9 % (ref 0.0–3.0)
Eosinophils Absolute: 0.2 10*3/uL (ref 0.0–0.7)
Eosinophils Relative: 2.7 % (ref 0.0–5.0)
HCT: 47.1 % (ref 39.0–52.0)
Hemoglobin: 15.6 g/dL (ref 13.0–17.0)
Lymphocytes Relative: 19 % (ref 12.0–46.0)
Lymphs Abs: 1.2 10*3/uL (ref 0.7–4.0)
MCHC: 33.1 g/dL (ref 30.0–36.0)
MCV: 99.7 fl (ref 78.0–100.0)
Monocytes Absolute: 0.6 10*3/uL (ref 0.1–1.0)
Monocytes Relative: 10.1 % (ref 3.0–12.0)
Neutro Abs: 4.4 10*3/uL (ref 1.4–7.7)
Neutrophils Relative %: 67.3 % (ref 43.0–77.0)
Platelets: 190 10*3/uL (ref 150.0–400.0)
RBC: 4.73 Mil/uL (ref 4.22–5.81)
RDW: 13.7 % (ref 11.5–15.5)
WBC: 6.5 10*3/uL (ref 4.0–10.5)

## 2019-09-25 LAB — COMPREHENSIVE METABOLIC PANEL
ALT: 15 U/L (ref 0–53)
AST: 13 U/L (ref 0–37)
Albumin: 4.2 g/dL (ref 3.5–5.2)
Alkaline Phosphatase: 71 U/L (ref 39–117)
BUN: 13 mg/dL (ref 6–23)
CO2: 28 mEq/L (ref 19–32)
Calcium: 9.2 mg/dL (ref 8.4–10.5)
Chloride: 104 mEq/L (ref 96–112)
Creatinine, Ser: 0.83 mg/dL (ref 0.40–1.50)
GFR: 90.06 mL/min (ref >60.00)
Glucose, Bld: 194 mg/dL — ABNORMAL HIGH (ref 70–99)
Potassium: 4.1 mEq/L (ref 3.5–5.1)
Sodium: 140 mEq/L (ref 135–145)
Total Bilirubin: 0.9 mg/dL (ref 0.2–1.2)
Total Protein: 6.7 g/dL (ref 6.0–8.3)

## 2019-09-25 LAB — LIPID PANEL
Cholesterol: 137 mg/dL (ref 0–200)
HDL: 36.1 mg/dL — ABNORMAL LOW (ref >39.00)
LDL Cholesterol: 78 mg/dL (ref 0–99)
NonHDL: 101.35
Total CHOL/HDL Ratio: 4
Triglycerides: 119 mg/dL (ref 0.0–149.0)
VLDL: 23.8 mg/dL (ref 0.0–40.0)

## 2019-09-25 LAB — TSH: TSH: 2.38 u[IU]/mL (ref 0.35–4.50)

## 2019-09-25 LAB — HEMOGLOBIN A1C: Hgb A1c MFr Bld: 8 % — ABNORMAL HIGH (ref 4.6–6.5)

## 2019-09-25 MED ORDER — DIGOXIN 250 MCG PO TABS: ORAL_TABLET | ORAL | 0 refills | Status: DC

## 2019-09-25 NOTE — Progress Notes (Signed)
I have reviewed and agree with note, evaluation, plan.  See my separate note from today  Kymber Kosar, MD  

## 2019-09-25 NOTE — Patient Instructions (Signed)
Carl Wood , Thank you for taking time to come for your Medicare Wellness Visit. I appreciate your ongoing commitment to your health goals. Please review the following plan we discussed and let me know if I can assist you in the future.   Screening recommendations/referrals: Colorectal Screening: up to date; 12/11/12  Vision and Dental Exams: Recommended annual ophthalmology exams for early detection of glaucoma and other disorders of the eye Recommended annual dental exams for proper oral hygiene  Diabetic Exams: Diabetic Eye Exam: recommended yearly; up to date Diabetic Foot Exam: up to date; 06/19/19  Vaccinations: Influenza vaccine: completed 08/16/19 Pneumococcal vaccine: up to date; 11/15/14 Tdap vaccine: up to date; last 11/15/14  Shingles vaccine: Shingrix completed   Advanced directives: Advance directives discussed with you today. I have provided a copy for you to complete at home and have notarized. Once this is complete please bring a copy in to our office so we can scan it into your chart.  Goals: Recommend to drink at least 6-8 8oz glasses of water per day and consume a balanced diet rich in fresh fruits and vegetables.   Next appointment: Please schedule your Annual Wellness Visit with your Nurse Health Advisor in one year.  Preventive Care 39 Years and Older, Male Preventive care refers to lifestyle choices and visits with your health care provider that can promote health and wellness. What does preventive care include?  A yearly physical exam. This is also called an annual well check.  Dental exams once or twice a year.  Routine eye exams. Ask your health care provider how often you should have your eyes checked.  Personal lifestyle choices, including:  Daily care of your teeth and gums.  Regular physical activity.  Eating a healthy diet.  Avoiding tobacco and drug use.  Limiting alcohol use.  Practicing safe sex.  Taking low doses of aspirin every day  if recommended by your health care provider..  Taking vitamin and mineral supplements as recommended by your health care provider. What happens during an annual well check? The services and screenings done by your health care provider during your annual well check will depend on your age, overall health, lifestyle risk factors, and family history of disease. Counseling  Your health care provider may ask you questions about your:  Alcohol use.  Tobacco use.  Drug use.  Emotional well-being.  Home and relationship well-being.  Sexual activity.  Eating habits.  History of falls.  Memory and ability to understand (cognition).  Work and work Statistician. Screening  You may have the following tests or measurements:  Height, weight, and BMI.  Blood pressure.  Lipid and cholesterol levels. These may be checked every 5 years, or more frequently if you are over 76 years old.  Skin check.  Lung cancer screening. You may have this screening every year starting at age 76 if you have a 30-pack-year history of smoking and currently smoke or have quit within the past 15 years.  Fecal occult blood test (FOBT) of the stool. You may have this test every year starting at age 76.  Flexible sigmoidoscopy or colonoscopy. You may have a sigmoidoscopy every 5 years or a colonoscopy every 10 years starting at age 76.  Prostate cancer screening. Recommendations will vary depending on your family history and other risks.  Hepatitis C blood test.  Hepatitis B blood test.  Sexually transmitted disease (STD) testing.  Diabetes screening. This is done by checking your blood sugar (glucose) after you have not  eaten for a while (fasting). You may have this done every 1-3 years.  Abdominal aortic aneurysm (AAA) screening. You may need this if you are a current or former smoker.  Osteoporosis. You may be screened starting at age 76 if you are at high risk. Talk with your health care provider  about your test results, treatment options, and if necessary, the need for more tests. Vaccines  Your health care provider may recommend certain vaccines, such as:  Influenza vaccine. This is recommended every year.  Tetanus, diphtheria, and acellular pertussis (Tdap, Td) vaccine. You may need a Td booster every 10 years.  Zoster vaccine. You may need this after age 76.  Pneumococcal 13-valent conjugate (PCV13) vaccine. One dose is recommended after age 76.  Pneumococcal polysaccharide (PPSV23) vaccine. One dose is recommended after age 76. Talk to your health care provider about which screenings and vaccines you need and how often you need them. This information is not intended to replace advice given to you by your health care provider. Make sure you discuss any questions you have with your health care provider. Document Released: 12/19/2015 Document Revised: 08/11/2016 Document Reviewed: 09/23/2015 Elsevier Interactive Patient Education  2017 [employer] Prevention in the Home Falls can cause injuries. They can happen to people of all ages. There are many things you can do to make your home safe and to help prevent falls. What can I do on the outside of my home?  Regularly fix the edges of walkways and driveways and fix any cracks.  Remove anything that might make you trip as you walk through a door, such as a raised step or threshold.  Trim any bushes or trees on the path to your home.  Use bright outdoor lighting.  Clear any walking paths of anything that might make someone trip, such as rocks or tools.  Regularly check to see if handrails are loose or broken. Make sure that both sides of any steps have handrails.  Any raised decks and porches should have guardrails on the edges.  Have any leaves, snow, or ice cleared regularly.  Use sand or salt on walking paths during winter.  Clean up any spills in your garage right away. This includes oil or grease spills.  What can I do in the bathroom?  Use night lights.  Install grab bars by the toilet and in the tub and shower. Do not use towel bars as grab bars.  Use non-skid mats or decals in the tub or shower.  If you need to sit down in the shower, use a plastic, non-slip stool.  Keep the floor dry. Clean up any water that spills on the floor as soon as it happens.  Remove soap buildup in the tub or shower regularly.  Attach bath mats securely with double-sided non-slip rug tape.  Do not have throw rugs and other things on the floor that can make you trip. What can I do in the bedroom?  Use night lights.  Make sure that you have a light by your bed that is easy to reach.  Do not use any sheets or blankets that are too big for your bed. They should not hang down onto the floor.  Have a firm chair that has side arms. You can use this for support while you get dressed.  Do not have throw rugs and other things on the floor that can make you trip. What can I do in the kitchen?  Clean up any spills  right away.  Avoid walking on wet floors.  Keep items that you use a lot in easy-to-reach places.  If you need to reach something above you, use a strong step stool that has a grab bar.  Keep electrical cords out of the way.  Do not use floor polish or wax that makes floors slippery. If you must use wax, use non-skid floor wax.  Do not have throw rugs and other things on the floor that can make you trip. What can I do with my stairs?  Do not leave any items on the stairs.  Make sure that there are handrails on both sides of the stairs and use them. Fix handrails that are broken or loose. Make sure that handrails are as long as the stairways.  Check any carpeting to make sure that it is firmly attached to the stairs. Fix any carpet that is loose or worn.  Avoid having throw rugs at the top or bottom of the stairs. If you do have throw rugs, attach them to the floor with carpet tape.   Make sure that you have a light switch at the top of the stairs and the bottom of the stairs. If you do not have them, ask someone to add them for you. What else can I do to help prevent falls?  Wear shoes that:  Do not have high heels.  Have rubber bottoms.  Are comfortable and fit you well.  Are closed at the toe. Do not wear sandals.  If you use a stepladder:  Make sure that it is fully opened. Do not climb a closed stepladder.  Make sure that both sides of the stepladder are locked into place.  Ask someone to hold it for you, if possible.  Clearly mark and make sure that you can see:  Any grab bars or handrails.  First and last steps.  Where the edge of each step is.  Use tools that help you move around (mobility aids) if they are needed. These include:  Canes.  Walkers.  Scooters.  Crutches.  Turn on the lights when you go into a dark area. Replace any light bulbs as soon as they burn out.  Set up your furniture so you have a clear path. Avoid moving your furniture around.  If any of your floors are uneven, fix them.  If there are any pets around you, be aware of where they are.  Review your medicines with your doctor. Some medicines can make you feel dizzy. This can increase your chance of falling. Ask your doctor what other things that you can do to help prevent falls. This information is not intended to replace advice given to you by your health care provider. Make sure you discuss any questions you have with your health care provider. Document Released: 09/18/2009 Document Revised: 04/29/2016 Document Reviewed: 12/27/2014 Elsevier Interactive Patient Education  2017 [employer].

## 2019-09-25 NOTE — Progress Notes (Signed)
Subjective:   Carl Wood is a 78 y.o. male who presents for Medicare Annual/Subsequent preventive examination.  Review of Systems:   Cardiac Risk Factors include: advanced age (>100men, >39 women);male gender;dyslipidemia;diabetes mellitus;sedentary lifestyle     Objective:    Vitals: BP (!) 94/58 (BP Location: Left Arm, Patient Position: Sitting, Cuff Size: Normal)   Temp 97.9 F (36.6 C) (Temporal)   Ht 6' (1.829 m)   Wt 207 lb (93.9 kg)   BMI 28.07 kg/m   Body mass index is 28.07 kg/m.  Advanced Directives 09/25/2019 11/14/2018 06/22/2018 03/28/2017 03/28/2017 04/15/2016 01/20/2016  Does Patient Have a Medical Advance Directive? No No No No No No No  Would patient like information on creating a medical advance directive? Yes (MAU/Ambulatory/Procedural Areas - Information given) No - Patient declined - No - Patient declined No - Patient declined No - patient declined information No - patient declined information  Pre-existing out of facility DNR order (yellow form or pink MOST form) - - - - - - -    Tobacco Social History   Tobacco Use  Smoking Status Former Smoker  . Packs/day: 1.00  . Years: 50.00  . Pack years: 50.00  . Types: Cigarettes  . Quit date: 12/15/2012  . Years since quitting: 6.7  Smokeless Tobacco Never Used     Counseling given: Not Answered   Clinical Intake:  Pre-visit preparation completed: Yes  Pain : No/denies pain  Diabetes: Yes CBG done?: No Did pt. bring in CBG monitor from home?: No(reports fasting blood sugars to be 130-170)  How often do you need to have someone help you when you read instructions, pamphlets, or other written materials from your doctor or pharmacy?: 2 - Rarely  Interpreter Needed?: No  Information entered by :: Denman George LPN  Past Medical History:  Diagnosis Date  . CAD (coronary artery disease)   . CHF (congestive heart failure) ([place])   . Chronic atrial fibrillation ([place])   . Colon polyps   .  Diabetes mellitus   . Diverticulosis of colon   . DIVERTICULOSIS, COLON 10/16/2006   Qualifier: Diagnosis of  By: Leanne Chang MD, [place]    . Excessive daytime sleepiness 02/19/2016  . Hyperlipidemia   . Hypertension   . MI (mitral incompetence)   . Nephrolithiasis   . NEPHROLITHIASIS 06/26/2008   Qualifier: Diagnosis of  By: Sherlynn Stalls, CMA, [place]    . OSA (obstructive sleep apnea) 02/19/2016   Moderate to severe OSA with an AHI of 25/hr and on CPAP at 8cm H2O  . PE (pulmonary embolism)   . V-tach [facility])    Past Surgical History:  Procedure Laterality Date  . BACK SURGERY    . BASAL CELL CARCINOMA EXCISION     nose  . BIV UPGRADE N/A 03/28/2017   Procedure: BiV Upgrade;  Surgeon: Deboraha Sprang, MD;  Location: [place] [facility];  Service: Cardiovascular;  Laterality: N/A;  . CARDIAC CATHETERIZATION N/A 01/20/2016   Procedure: Right/Left Heart Cath and Coronary Angiography;  Surgeon: Jolaine Artist, MD;  Location: [place] [facility];  Service: Cardiovascular;  Laterality: N/A;  . CARDIAC DEFIBRILLATOR PLACEMENT     medtronic virtuoso  . CHOLECYSTECTOMY    . COLONOSCOPY  12/11/2012   Procedure: COLONOSCOPY;  Surgeon: Inda Castle, MD;  Location: [facility];  Service: Endoscopy;  Laterality: N/A;  . KNEE ARTHROPLASTY     Family History  Problem Relation Age of Onset  . Colon cancer Father   .  Diabetes Father   . Heart disease Father   . Bladder Cancer Mother   . Heart disease Mother   . Heart attack Child 28  . Heart attack Child 19   Social History   Socioeconomic History  . Marital status: Married    Spouse name: [name]  . Number of children: 3  . Years of education: 60  . Highest education level: Not on file  Occupational History  . Occupation: Retired    Fish farm manager: RETIRED  Social Needs  . Financial resource strain: Not on file  . Food insecurity    Worry: Not on file    Inability: Not on file  . Transportation needs    Medical: Not on file    Non-medical: Not on  file  Tobacco Use  . Smoking status: Former Smoker    Packs/day: 1.00    Years: 50.00    Pack years: 50.00    Types: Cigarettes    Quit date: 12/15/2012    Years since quitting: 6.7  . Smokeless tobacco: Never Used  Substance and Sexual Activity  . Alcohol use: No    Alcohol/week: 0.0 standard drinks  . Drug use: No  . Sexual activity: Yes    Birth control/protection: None  Lifestyle  . Physical activity    Days per week: Not on file    Minutes per session: Not on file  . Stress: Not on file  Relationships  . Social Herbalist on phone: Not on file    Gets together: Not on file    Attends religious service: Not on file    Active member of club or organization: Not on file    Attends meetings of clubs or organizations: Not on file    Relationship status: Not on file  Other Topics Concern  . Not on file  Social History Narrative   Married. 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 atorvastatin (LIPITOR) 80 MG tablet TAKE 1 TABLET BY MOUTH ONCE DAILY AS DIRECTED  . budesonide-formoterol (SYMBICORT) 80-4.5 MCG/ACT inhaler Inhale 2 puffs into the lungs 2 (two) times daily.  . carvedilol (COREG) 6.25 MG tablet TAKE 1 TABLET BY MOUTH TWICE DAILY WITH A MEAL  . digoxin (LANOXIN) 0.25 MG tablet Take 1/2 (one-half) tablet by mouth once daily  . fluticasone (FLONASE) 50 MCG/ACT nasal spray Place 1 spray into both nostrils daily.  . furosemide (LASIX) 20 MG tablet TAKE 1 TABLET BY MOUTH ONCE A WEEK  . glimepiride (AMARYL) 4 MG tablet TAKE 2 TABLETS BY MOUTH ONCE DAILY WITH BREAKFAST  . glucose blood (ACCU-CHEK AVIVA PLUS) test strip TEST BLOOD SUGAR  DAILY  . JARDIANCE 10 MG TABS tablet Take 1 tablet by mouth  once daily  . metFORMIN (GLUCOPHAGE) 1000 MG tablet TAKE 1 TABLET BY MOUTH TWICE DAILY WITH A MEAL  . sacubitril-valsartan (ENTRESTO) 49-51 MG Take 1 tablet by mouth 2 (two) times daily.  Alveda Reasons 20 MG TABS tablet Take 1 tablet by mouth once daily   No facility-administered encounter medications on file as of 09/25/2019.     Activities of Daily Living In your present state of health, do you have any difficulty performing the following activities: 09/25/2019  Hearing?  N  Vision? N  Difficulty concentrating or making decisions? N  Walking or climbing stairs? N  Dressing or bathing? N  Doing errands, shopping? N  Preparing Food and eating ? N  Using the Toilet? N  In the past six months, have you accidently leaked urine? N  Do you have problems with loss of bowel control? N  Managing your Medications? N  Managing your Finances? N  Housekeeping or managing your Housekeeping? N  Some recent data might be hidden    Patient Care Team: Marin Olp, MD as PCP - General (Family Medicine) Deboraha Sprang, MD as Consulting Physician (Cardiology) Magdalen Spatz, NP as Nurse Practitioner (Pulmonary Disease) Pieter Partridge, DO as Consulting Physician (Neurology) Opthamology, [facility] as Consulting Physician (Ophthalmology) Macario Carls, MD as Consulting Physician (Dermatology)   Assessment:   This is a routine wellness examination for Basir.  Exercise Activities and Dietary recommendations Current Exercise Habits: The patient does not participate in regular exercise at present  Goals    . Patient Stated     To try to maintain your health.       Fall Risk Fall Risk  09/25/2019 09/25/2019 08/16/2019 06/19/2019 06/22/2018  Falls in the past year? 0 0 0 0 No  Number falls in past yr: 0 - 0 0 -  Injury with Fall? 0 0 0 0 -  Follow up - Falls evaluation completed;Education provided;Falls prevention discussed - - -   Is the patient's home free of loose throw rugs in walkways,  pet beds, electrical cords, etc?   yes      Grab bars in the bathroom? yes      Handrails on the stairs?   yes      Adequate lighting?   yes  Timed Get Up and Go Performed: completed and within normal timeframe; no gait abnormalities noted   Depression Screen PHQ 2/9 Scores 09/25/2019 09/25/2019 08/16/2019 12/26/2018  PHQ - 2 Score 0 0 0 1    Cognitive Function-no cognitive concerns at this time  MMSE - Mini Mental State Exam 06/22/2018  Not completed: (No Data)     6CIT Screen 09/25/2019  What Year? 0 points  What month? 0 points  What time? 0 points  Count back from 20 0 points  Months in reverse 0 points  Repeat phrase 2 points  Total Score 2    Immunization History  Administered Date(s) Administered  . DTaP 09/01/2011  . Fluad Quad(high Dose 65+) 08/16/2019  . Influenza Split 09/08/2011  . Influenza Whole 08/23/2008, 09/10/2009, 09/04/2010  . Influenza, High Dose Seasonal PF 09/07/2013, 09/17/2014, 09/10/2016, 08/11/2017, 09/04/2018  . Influenza-Unspecified 09/03/2015  . Pneumococcal Conjugate-13 11/15/2014  . Pneumococcal Polysaccharide-23 09/08/2011  . Tdap 11/15/2014  . Zoster 02/06/2008  . Zoster Recombinat (Shingrix) 05/09/2018, 08/08/2018    Qualifies for Shingles Vaccine? Shingrix completed   Screening Tests Health Maintenance  Topic Date Due  . HEMOGLOBIN A1C  12/20/2019  . FOOT EXAM  06/18/2020  . OPHTHALMOLOGY EXAM  07/22/2020  . TETANUS/TDAP  11/15/2024  . INFLUENZA VACCINE  Completed  . PNA vac Low Risk Adult  Completed   Cancer Screenings: Lung: Low Dose CT Chest recommended if Age 32-80 years, 30 pack-year currently smoking OR have quit w/in 15years. Patient does not qualify. Colorectal: colonoscopy 12/11/12 with Dr. Deatra Ina       Plan:  I have personally reviewed and addressed the Medicare Annual Wellness questionnaire and have noted the following in the patient's chart:  A. Medical and social history B. Use of alcohol, tobacco or illicit  drugs  C. Current medications and supplements D. Functional ability and status E.  Nutritional status F.  Physical activity G. Advance directives H. List of other physicians I.  Hospitalizations, surgeries, and ER visits in previous 12 months J.  [place] such as hearing and vision if needed, cognitive and depression L. Referrals, records requested, and appointments- none   In addition, I have reviewed and discussed with patient certain preventive protocols, quality metrics, and best practice recommendations. A written personalized care plan for preventive services as well as general preventive health recommendations were provided to patient.   Signed,  Denman George, LPN  Nurse Health Advisor   Nurse Notes: no additional

## 2019-10-09 ENCOUNTER — Encounter: Payer: Medicare HMO | Admitting: *Deleted

## 2019-10-12 ENCOUNTER — Other Ambulatory Visit: Payer: Self-pay

## 2019-10-15 ENCOUNTER — Other Ambulatory Visit: Payer: Self-pay | Admitting: Family Medicine

## 2019-10-17 ENCOUNTER — Telehealth: Payer: Self-pay | Admitting: Internal Medicine

## 2019-10-17 NOTE — Telephone Encounter (Signed)
New Message    Pt is calling and says he cannot see the results of his last transmission on mychart  and would like for someone to call him with those results    Please call back

## 2019-10-20 ENCOUNTER — Other Ambulatory Visit: Payer: Self-pay | Admitting: Family Medicine

## 2019-10-20 ENCOUNTER — Other Ambulatory Visit (HOSPITAL_COMMUNITY): Payer: Self-pay | Admitting: Internal Medicine

## 2019-10-24 ENCOUNTER — Other Ambulatory Visit (HOSPITAL_COMMUNITY): Payer: Self-pay

## 2019-10-24 MED ORDER — ENTRESTO 49-51 MG PO TABS: 1.0000 | ORAL_TABLET | Freq: Two times a day (BID) | ORAL | 11 refills | Status: DC

## 2019-10-24 NOTE — Telephone Encounter (Signed)
I spoke with the pt wife to follow up to see if the pt needs help sending a transmission with his home monitor. I gave her my direct office number for the pt to call to get help.

## 2019-10-25 NOTE — Telephone Encounter (Signed)
I spoke with the pt to let him know we did not receive a transmission in November. The pt wanted to know why we did not contact him about it. I told the pt that the Amber, NP sent him a mychart message to let him know we did not receive the transmission. He states he did not get that message. I told him I can not explain why the message she sent did not connect to his mychart but it shows me she sent the message and it shows unread. I told him I called him to follow up when I saw he did not read the mychart message to let him know we did not receive it. He tried to send the transmission while I was on the phone and her received the error code [id number]. I gave him the number to [place] for additional help.

## 2019-10-26 ENCOUNTER — Telehealth (HOSPITAL_COMMUNITY): Payer: Self-pay | Admitting: Pharmacy Technician

## 2019-10-26 NOTE — Telephone Encounter (Signed)
It's time to re-enroll patient to receive medication assistance for Entresto from [employer]. Left voicemail for patient to call me back so that we can start the re-enrollment process.  Will follow up.  Charlann Boxer, CPhT

## 2019-10-26 NOTE — Telephone Encounter (Signed)
LMOVM to follow up with the pt to see if he called [employer] tech support to get assistance with his home monitor.   Per [employer] Monitor orderd 10-25-2019

## 2019-10-30 ENCOUNTER — Ambulatory Visit (INDEPENDENT_AMBULATORY_CARE_PROVIDER_SITE_OTHER): Payer: Medicare HMO | Admitting: *Deleted

## 2019-10-30 DIAGNOSIS — I5022 Chronic systolic (congestive) heart failure: Secondary | ICD-10-CM | POA: Diagnosis not present

## 2019-10-31 LAB — CUP PACEART REMOTE DEVICE CHECK
Battery Remaining Longevity: 33 mo
Battery Voltage: 2.95 V
Brady Statistic AP VP Percent: 0 %
Brady Statistic AP VS Percent: 0 %
Brady Statistic AS VP Percent: 0 %
Brady Statistic AS VS Percent: 0 %
Brady Statistic RA Percent Paced: 0 %
Brady Statistic RV Percent Paced: 97.46 %
Date Time Interrogation Session: 20201124174523
HighPow Impedance: 76 Ohm
Implantable Lead Implant Date: 20010723
Implantable Lead Implant Date: 20180423
Implantable Lead Location: 753858
Implantable Lead Location: 753860
Implantable Lead Model: 6943
Implantable Pulse Generator Implant Date: 20180423
Lead Channel Impedance Value: 1007 Ohm
Lead Channel Impedance Value: 1007 Ohm
Lead Channel Impedance Value: 1026 Ohm
Lead Channel Impedance Value: 261.164
Lead Channel Impedance Value: 265.661
Lead Channel Impedance Value: 270.667
Lead Channel Impedance Value: 283.733
Lead Channel Impedance Value: 289.049
Lead Channel Impedance Value: 304 Ohm
Lead Channel Impedance Value: 4047 Ohm
Lead Channel Impedance Value: 418 Ohm
Lead Channel Impedance Value: 513 Ohm
Lead Channel Impedance Value: 532 Ohm
Lead Channel Impedance Value: 551 Ohm
Lead Channel Impedance Value: 608 Ohm
Lead Channel Impedance Value: 931 Ohm
Lead Channel Impedance Value: 988 Ohm
Lead Channel Impedance Value: 988 Ohm
Lead Channel Pacing Threshold Amplitude: 1.125 V
Lead Channel Pacing Threshold Amplitude: 1.375 V
Lead Channel Pacing Threshold Pulse Width: 0.4 ms
Lead Channel Pacing Threshold Pulse Width: 1 ms
Lead Channel Sensing Intrinsic Amplitude: 12.5 mV
Lead Channel Sensing Intrinsic Amplitude: 12.5 mV
Lead Channel Setting Pacing Amplitude: 2 V
Lead Channel Setting Pacing Amplitude: 2.75 V
Lead Channel Setting Pacing Pulse Width: 0.4 ms
Lead Channel Setting Pacing Pulse Width: 1 ms
Lead Channel Setting Sensing Sensitivity: 0.3 mV

## 2019-11-06 NOTE — Telephone Encounter (Signed)
Transmission received 10-30-2019 and he was scheduled an appointment

## 2019-11-07 ENCOUNTER — Telehealth (HOSPITAL_COMMUNITY): Payer: Self-pay | Admitting: Pharmacist

## 2019-11-07 NOTE — Telephone Encounter (Signed)
Sent in [employer] application to [employer] for [employer].   Phone:[phone] Fax: [phone]  Application pending, will continue to follow.  Audry Riles, PharmD, BCPS, BCCP, CPP Heart Failure Clinic Pharmacist [phone]

## 2019-11-12 ENCOUNTER — Telehealth: Payer: Self-pay

## 2019-11-12 NOTE — Telephone Encounter (Signed)
I let the pt know we did get his transmission on 10-30-2019.

## 2019-11-24 ENCOUNTER — Other Ambulatory Visit (HOSPITAL_COMMUNITY): Payer: Self-pay | Admitting: Internal Medicine

## 2019-12-04 ENCOUNTER — Telehealth: Payer: Self-pay | Admitting: *Deleted

## 2019-12-04 NOTE — Telephone Encounter (Signed)

## 2019-12-11 ENCOUNTER — Other Ambulatory Visit: Payer: Self-pay | Admitting: Family Medicine

## 2019-12-12 ENCOUNTER — Other Ambulatory Visit: Payer: Self-pay

## 2019-12-12 ENCOUNTER — Ambulatory Visit (HOSPITAL_COMMUNITY)
Admission: RE | Admit: 2019-12-12 | Discharge: 2019-12-12 | Disposition: A | Discharge status: Home / Self Care | Payer: Medicare HMO | Source: Ambulatory Visit | Attending: Family Medicine | Admitting: Family Medicine

## 2019-12-12 ENCOUNTER — Encounter (HOSPITAL_COMMUNITY): Payer: Self-pay | Admitting: Internal Medicine

## 2019-12-12 ENCOUNTER — Ambulatory Visit (HOSPITAL_BASED_OUTPATIENT_CLINIC_OR_DEPARTMENT_OTHER)
Admission: RE | Admit: 2019-12-12 | Discharge: 2019-12-12 | Disposition: A | Discharge status: Home / Self Care | Payer: Medicare HMO | Source: Ambulatory Visit | Attending: Internal Medicine | Admitting: Internal Medicine

## 2019-12-12 VITALS — BP 104/67 | HR 88 | Wt 212.0 lb

## 2019-12-12 DIAGNOSIS — Z8249 Family history of ischemic heart disease and other diseases of the circulatory system: Secondary | ICD-10-CM | POA: Diagnosis not present

## 2019-12-12 DIAGNOSIS — I4821 Permanent atrial fibrillation: Secondary | ICD-10-CM

## 2019-12-12 DIAGNOSIS — I482 Chronic atrial fibrillation, unspecified: Secondary | ICD-10-CM | POA: Diagnosis not present

## 2019-12-12 DIAGNOSIS — I110 Hypertensive heart disease with heart failure: Secondary | ICD-10-CM | POA: Insufficient documentation

## 2019-12-12 DIAGNOSIS — I5022 Chronic systolic (congestive) heart failure: Secondary | ICD-10-CM

## 2019-12-12 DIAGNOSIS — Z87891 Personal history of nicotine dependence: Secondary | ICD-10-CM | POA: Diagnosis not present

## 2019-12-12 DIAGNOSIS — I252 Old myocardial infarction: Secondary | ICD-10-CM | POA: Diagnosis not present

## 2019-12-12 DIAGNOSIS — Z86711 Personal history of pulmonary embolism: Secondary | ICD-10-CM | POA: Diagnosis not present

## 2019-12-12 DIAGNOSIS — E119 Type 2 diabetes mellitus without complications: Secondary | ICD-10-CM | POA: Insufficient documentation

## 2019-12-12 DIAGNOSIS — Z7984 Long term (current) use of oral hypoglycemic drugs: Secondary | ICD-10-CM | POA: Diagnosis not present

## 2019-12-12 DIAGNOSIS — G4733 Obstructive sleep apnea (adult) (pediatric): Secondary | ICD-10-CM | POA: Diagnosis not present

## 2019-12-12 DIAGNOSIS — Z79899 Other long term (current) drug therapy: Secondary | ICD-10-CM | POA: Diagnosis not present

## 2019-12-12 DIAGNOSIS — Z7901 Long term (current) use of anticoagulants: Secondary | ICD-10-CM | POA: Insufficient documentation

## 2019-12-12 DIAGNOSIS — I2510 Atherosclerotic heart disease of native coronary artery without angina pectoris: Secondary | ICD-10-CM

## 2019-12-12 DIAGNOSIS — Z8673 Personal history of transient ischemic attack (TIA), and cerebral infarction without residual deficits: Secondary | ICD-10-CM | POA: Insufficient documentation

## 2019-12-12 DIAGNOSIS — Z95810 Presence of automatic (implantable) cardiac defibrillator: Secondary | ICD-10-CM | POA: Insufficient documentation

## 2019-12-12 DIAGNOSIS — E785 Hyperlipidemia, unspecified: Secondary | ICD-10-CM | POA: Diagnosis not present

## 2019-12-12 LAB — CBC
HCT: 48.7 % (ref 39.0–52.0)
Hemoglobin: 16.2 g/dL (ref 13.0–17.0)
MCH: 33.3 pg (ref 26.0–34.0)
MCHC: 33.3 g/dL (ref 30.0–36.0)
MCV: 100 fL (ref 80.0–100.0)
Platelets: 176 10*3/uL (ref 150–400)
RBC: 4.87 MIL/uL (ref 4.22–5.81)
RDW: 12.9 % (ref 11.5–15.5)
WBC: 7 10*3/uL (ref 4.0–10.5)
nRBC: 0 % (ref 0.0–0.2)

## 2019-12-12 LAB — BASIC METABOLIC PANEL
Anion gap: 10 (ref 5–15)
BUN: 20 mg/dL (ref 8–23)
CO2: 26 mmol/L (ref 22–32)
Calcium: 9.4 mg/dL (ref 8.9–10.3)
Chloride: 103 mmol/L (ref 98–111)
Creatinine, Ser: 1.07 mg/dL (ref 0.61–1.24)
GFR calc Af Amer: >60 mL/min (ref >60)
GFR calc non Af Amer: >60 mL/min (ref >60)
Glucose, Bld: 266 mg/dL — ABNORMAL HIGH (ref 70–99)
Potassium: 4 mmol/L (ref 3.5–5.1)
Sodium: 139 mmol/L (ref 135–145)

## 2019-12-12 LAB — BRAIN NATRIURETIC PEPTIDE: B Natriuretic Peptide: 284.4 pg/mL — ABNORMAL HIGH (ref 0.0–100.0)

## 2019-12-12 LAB — DIGOXIN LEVEL: Digoxin Level: 0.5 ng/mL — ABNORMAL LOW (ref 0.8–2.0)

## 2019-12-12 MED ORDER — PERFLUTREN LIPID MICROSPHERE
1.0000 mL | INTRAVENOUS | Status: DC | PRN
  Administered 2019-12-12: 3 mL via INTRAVENOUS
  Filled 2019-12-12: qty 10

## 2019-12-12 MED ORDER — CARVEDILOL 12.5 MG PO TABS: 12.5000 mg | ORAL_TABLET | Freq: Two times a day (BID) | ORAL | 3 refills | Status: DC

## 2019-12-12 NOTE — Progress Notes (Signed)
  Echocardiogram 2D Echocardiogram has been performed.  Carl Wood 12/12/2019, 2:31 PM

## 2019-12-12 NOTE — Patient Instructions (Signed)
Lab work done today. We will notify you of any abnormal lab work. No news is good news!  EKG done today.  INCREASE Carvedilol 12.5mg  tab two times daily.  Please follow up with the [facility] in 6 months. We currently do not have the schedule for that far out. If you do no received a call from the [facility] by May 2021, please call us at [phone] option #3 in order to schedule this appointment.  At the [facility], you and your health needs are our priority. As part of our continuing mission to provide you with exceptional heart care, we have created designated Provider Care Teams. These Care Teams include your primary Cardiologist (physician) and Advanced Practice Providers (APPs- Physician Assistants and Nurse Practitioners) who all work together to provide you with the care you need, when you need it.   You may see any of the following providers on your designated Care Team at your next follow up: Marland Kitchen Dr Glori Bickers . Dr Loralie Champagne . Darrick Grinder, NP . Lyda Jester, PA . Audry Riles, PharmD   Please be sure to bring in all your medications bottles to every appointment.

## 2019-12-12 NOTE — Progress Notes (Signed)
Advanced Heart Failure Clinic Note    Date:  12/12/2019   ID:  Carl Wood, DOB [birth year], MRN [id number]  Location: Home  Provider location: [address], [place] Type of Visit: Established patient PCP:  Marin Olp, MD  Cardiologist:  No primary care provider on file. Primary HF: Dr Haroldine Laws   Chief Complaint: Heart Failure   History of Present Illness: Carl Wood is a 79 y.o. male who presents via audio conferencing for a telehealth visit today.     Carl Wood is a 79 year old with a history of CAD s/p MI 1983, ICM with chronic systolic heart failure s/p Medtronic single chamber ICD (EF 20% on last echo 8/19), CVA 2014, DM , VT, PE, HTN, permanent A fib on Xarelto, SDH 2019,  OSA, and former smoker.   Had CRT-D upgrade in April 2018.   In 12/18 saw Dr. Tomi Likens for chronic HA. Found to have small SDHs that were felt to be chronic after a previous fall when trimming vines. Xarelto stopped. F/u C 2/19 SDHs resolved. Xarelto restarted in 2/19  Here for routine f/u. Says he is feeling pretty. Able to do all ADLs without problem. Not doing much exercise with cold weather. Denies CP, edema, orthopnea or PND. No bleeding on Xarelto.    Echo today EF 20-25%  Past Medical History:  Diagnosis Date  . CAD (coronary artery disease)   . CHF (congestive heart failure) ([place])   . Chronic atrial fibrillation ([place])   . Colon polyps   . Diabetes mellitus   . Diverticulosis of colon   . DIVERTICULOSIS, COLON 10/16/2006   Qualifier: Diagnosis of  By: Leanne Chang MD, [place]    . Excessive daytime sleepiness 02/19/2016  . Hyperlipidemia   . Hypertension   . MI (mitral incompetence)   . Nephrolithiasis   . NEPHROLITHIASIS 06/26/2008   Qualifier: Diagnosis of  By: Sherlynn Stalls, CMA, [place]    . OSA (obstructive sleep apnea) 02/19/2016   Moderate to severe OSA with an AHI of 25/hr and on CPAP at 8cm H2O  . PE (pulmonary embolism)   . V-tach [facility])    Past Surgical  History:  Procedure Laterality Date  . BACK SURGERY    . BASAL CELL CARCINOMA EXCISION     nose  . BIV UPGRADE N/A 03/28/2017   Procedure: BiV Upgrade;  Surgeon: Deboraha Sprang, MD;  Location: [place] [facility];  Service: Cardiovascular;  Laterality: N/A;  . CARDIAC CATHETERIZATION N/A 01/20/2016   Procedure: Right/Left Heart Cath and Coronary Angiography;  Surgeon: Jolaine Artist, MD;  Location: [place] [facility];  Service: Cardiovascular;  Laterality: N/A;  . CARDIAC DEFIBRILLATOR PLACEMENT     medtronic virtuoso  . CHOLECYSTECTOMY    . COLONOSCOPY  12/11/2012   Procedure: COLONOSCOPY;  Surgeon: Inda Castle, MD;  [facility]: [facility];  Service: Endoscopy;  Laterality: N/A;  . KNEE ARTHROPLASTY       Current Outpatient Medications  Medication Sig Dispense Refill  . ACCU-CHEK AVIVA PLUS test strip USE  STRIP TO CHECK GLUCOSE ONCE DAILY 100 each 4  . atorvastatin (LIPITOR) 80 MG tablet TAKE 1 TABLET BY MOUTH ONCE DAILY AS DIRECTED 90 tablet 2  . budesonide-formoterol (SYMBICORT) 80-4.5 MCG/ACT inhaler Inhale 2 puffs into the lungs 2 (two) times daily. 1 Inhaler 2  . carvedilol (COREG) 6.25 MG tablet TAKE 1 TABLET BY MOUTH TWICE DAILY WITH A MEAL 60 tablet 0  . digoxin (LANOXIN)  0.25 MG tablet Take 1/2 (one-half) tablet by mouth once daily. Please make yearly appt with Dr. Caryl Comes for January for future refills. 1st attempt 45 tablet 0  . fluticasone (FLONASE) 50 MCG/ACT nasal spray Place 1 spray into both nostrils daily. 16 g 6  . furosemide (LASIX) 20 MG tablet TAKE 1 TABLET BY MOUTH ONCE A WEEK 12 tablet 4  . glimepiride (AMARYL) 4 MG tablet TAKE 2 TABLETS BY MOUTH ONCE DAILY WITH BREAKFAST 180 tablet 3  . JARDIANCE 10 MG TABS tablet Take 1 tablet by mouth once daily 90 tablet 3  . metFORMIN (GLUCOPHAGE) 1000 MG tablet TAKE 1 TABLET BY MOUTH TWICE DAILY WITH A MEAL 180 tablet 0  . sacubitril-valsartan (ENTRESTO) 49-51 MG Take 1 tablet by mouth 2 (two) times daily. 60 tablet 11   . XARELTO 20 MG TABS tablet Take 1 tablet by mouth once daily 30 tablet 0   No current facility-administered medications for this encounter.   Facility-Administered Medications Ordered in Other Encounters  Medication Dose Route Frequency Provider Last Rate Last Admin  . perflutren lipid microspheres (DEFINITY) IV suspension  1-10 mL Intravenous PRN Carl Wood, Shaune Pascal, MD   3 mL at 12/12/19 1410    Allergies:   Penicillins and Sulfamethoxazole   Social History:  The patient  reports that he quit smoking about 6 years ago. His smoking use included cigarettes. He has a 50.00 pack-year smoking history. He has never used smokeless tobacco. He reports that he does not drink alcohol or use drugs.   Family History:  The patient's family history includes Bladder Cancer in his mother; Colon cancer in his father; Diabetes in his father; Heart attack (age of onset: 71) in his child; Heart attack (age of onset: 42) in his child; Heart disease in his father and mother.   ROS:  Please see the history of present illness.   All other systems are personally reviewed and negative.   Vitals:   12/12/19 1445  BP: 104/67  Pulse: 88  SpO2: 95%    Exam:  General:  Well appearing. No resp difficulty HEENT: normal Neck: supple. no JVD. Carotids 2+ bilat; no bruits. No lymphadenopathy or thryomegaly appreciated. Cor: PMI nondisplaced. Regular rate & rhythm. No rubs, gallops or murmurs. Lungs: clear Abdomen: soft, nontender, nondistended. No hepatosplenomegaly. No bruits or masses. Good bowel sounds. Extremities: no cyanosis, clubbing, rash, edema Neuro: alert & orientedx3, cranial nerves grossly intact. moves all 4 extremities w/o difficulty. Affect pleasant  ECG: AF with  v pacing 83. Personally reviewed   Recent Labs: 09/11/2019: B Natriuretic Peptide 89.9 09/25/2019: ALT 15; BUN 13; Creatinine, Ser 0.83; Hemoglobin 15.6; Platelets 190.0; Potassium 4.1; Sodium 140; TSH 2.38  Personally reviewed    Wt Readings from Last 3 Encounters:  12/12/19 96.2 kg (212 lb)  09/25/19 93.9 kg (207 lb)  09/25/19 93.9 kg (207 lb)    ASSESSMENT AND PLAN:  1. Chronic Systolic HF: ICM, s/p Medtronic Bi-V ICD (upgrade 4/18). Echo 09/2016 EF 25-30%. ECHO 8/19 EF 20% - Echo today 12/11/18 EF 20-25% RV ok  - Doing well stable NYHA II - CPX testing on 5/18 pVO2 down 14.5->13.0 but he feels better and is able to do all ADLs and his yard work without too much difficulty - Overall stable. If deteriorates any - would consider repeat CPX.  - Will continue to follow closely. If he gets worse can consider VAD (RV ok on echo) - age permitting - Weight and volume status stable - Will  try to increase carvedilol to 12.5 bid as tolerated - Continue Entresto 49/51 mg BID. Was previosuly on full dose but had to cut back.  - Continue Jardiance - No Spiro with history of hyperkalemia and soft BP.  - ICD interrogated in clinic volume up between Thanksgiving and Christmas. Now back down  No VT/AF. Activity 2hr/day. 100% biv pacing - Check labs including CBC, BMET and dig level   2. OSA - Continue CPAP. Follows with Dr. Radford Pax  3. Chronic A fib - ECG today with chronic AF and biv pacing - Rate controlled. Xarelto restarted after resolution SDH. Tolerating well. No bleeding  4. CAD: - Has known distal LAD lesion 95% and mid LAD 40%. No benefit to revascularization given wall motion abnormality.  - No s/s ischemia, Continue statin. Off ASA with Xarelto     Signed, Glori Bickers, MD  2:51 PM   [facility] [address] [facility] [place] [address] [phone] (office) [phone] (fax)

## 2019-12-12 NOTE — Progress Notes (Signed)
Samples given: 4 bottles  Xarelto 20mg    Lot: [id number] Ex: 11/21

## 2019-12-14 NOTE — Telephone Encounter (Signed)
Advanced Heart Failure Patient Advocate Encounter   Patient was approved to receive Entresto from [employer].  Patient ID: [id number] Effective dates: 12/13/19 through 12/05/20  Audry Riles, PharmD, BCPS, BCCP, CPP Heart Failure Clinic Pharmacist [phone]

## 2019-12-17 DIAGNOSIS — Z85828 Personal history of other malignant neoplasm of skin: Secondary | ICD-10-CM | POA: Diagnosis not present

## 2019-12-17 DIAGNOSIS — L819 Disorder of pigmentation, unspecified: Secondary | ICD-10-CM | POA: Diagnosis not present

## 2019-12-17 DIAGNOSIS — L905 Scar conditions and fibrosis of skin: Secondary | ICD-10-CM | POA: Diagnosis not present

## 2019-12-17 DIAGNOSIS — L570 Actinic keratosis: Secondary | ICD-10-CM | POA: Diagnosis not present

## 2019-12-24 ENCOUNTER — Other Ambulatory Visit: Payer: Self-pay | Admitting: Internal Medicine

## 2019-12-24 MED ORDER — DIGOXIN 250 MCG PO TABS: ORAL_TABLET | ORAL | 0 refills | Status: DC

## 2019-12-27 ENCOUNTER — Other Ambulatory Visit: Payer: Self-pay

## 2019-12-27 NOTE — Progress Notes (Signed)
Cardiology Office Note  ID:  [name], [name] 1941-08-28, MRN [id number]  Patient Location:  Home  Provider location:   [place]  PCP:  Marin Olp, MD  Cardiologist:  Glori Bickers, MD Sleep Medicine:  Fransico Him, MD Electrophysiologist:  None   Chief Complaint:  OSA  History of Present Illness:    Carl Wood is a 79 y.o. male who presents via audio/video conferencing for a telehealth visit today.    Carl Wood is a 79 y.o. male with a hx of moderate to severe OSA with an AHI of 25.2/hrand is on CPAP at8cm H2O.  He is doing well with his CPAP device and thinks that he has gotten used to it.  He tolerates the mask and feels the pressure is adequate.  Since going on CPAP he feels rested in the am and has no significant daytime sleepiness.  He denies any significant mouth or nasal dryness or nasal congestion.  He does not think that he snores.    The patient does not have symptoms concerning for COVID-19 infection (fever, chills, cough, or new shortness of breath).    Prior CV studies:   The following studies were reviewed today:  PAP compliance download  Past Medical History:  Diagnosis Date  . CAD (coronary artery disease)   . CHF (congestive heart failure) ([place])   . Chronic atrial fibrillation ([place])   . Colon polyps   . Diabetes mellitus   . Diverticulosis of colon   . DIVERTICULOSIS, COLON 10/16/2006   Qualifier: Diagnosis of  By: Leanne Chang MD, [place]    . Excessive daytime sleepiness 02/19/2016  . Hyperlipidemia   . Hypertension   . MI (mitral incompetence)   . Nephrolithiasis   . NEPHROLITHIASIS 06/26/2008   Qualifier: Diagnosis of  By: Sherlynn Stalls, CMA, [place]    . OSA (obstructive sleep apnea) 02/19/2016   Moderate to severe OSA with an AHI of 25/hr and on CPAP at 8cm H2O  . PE (pulmonary embolism)   . V-tach [facility])    Past Surgical History:  Procedure Laterality Date  . BACK SURGERY    . BASAL CELL CARCINOMA EXCISION     nose  .  BIV UPGRADE N/A 03/28/2017   Procedure: BiV Upgrade;  Surgeon: Deboraha Sprang, MD;  Location: [place] [facility];  Service: Cardiovascular;  Laterality: N/A;  . CARDIAC CATHETERIZATION N/A 01/20/2016   Procedure: Right/Left Heart Cath and Coronary Angiography;  Surgeon: Jolaine Artist, MD;  Location: [place] [facility];  Service: Cardiovascular;  Laterality: N/A;  . CARDIAC DEFIBRILLATOR PLACEMENT     medtronic virtuoso  . CHOLECYSTECTOMY    . COLONOSCOPY  12/11/2012   Procedure: COLONOSCOPY;  Surgeon: Inda Castle, MD;  Location: [facility];  Service: Endoscopy;  Laterality: N/A;  . KNEE ARTHROPLASTY       Current Meds  Medication Sig  . ACCU-CHEK AVIVA PLUS test strip USE  STRIP TO CHECK GLUCOSE ONCE DAILY  . Ascorbic Acid (VITAMIN C PO) Take 1 tablet by mouth.  Marland Kitchen atorvastatin (LIPITOR) 80 MG tablet TAKE 1 TABLET BY MOUTH ONCE DAILY AS DIRECTED  . budesonide-formoterol (SYMBICORT) 80-4.5 MCG/ACT inhaler Inhale 2 puffs into the lungs 2 (two) times daily.  . carvedilol (COREG) 12.5 MG tablet Take 1 tablet (12.5 mg total) by mouth 2 (two) times daily with a meal.  . digoxin (LANOXIN) 0.25 MG tablet Take 1/2 (one-half) tablet by mouth once daily. Please keep upcoming appt with Dr. Caryl Comes in  January for future refills.  . fluticasone (FLONASE) 50 MCG/ACT nasal spray Place 1 spray into both nostrils daily.  . furosemide (LASIX) 20 MG tablet TAKE 1 TABLET BY MOUTH ONCE A WEEK  . glimepiride (AMARYL) 4 MG tablet TAKE 2 TABLETS BY MOUTH ONCE DAILY WITH BREAKFAST  . JARDIANCE 10 MG TABS tablet Take 1 tablet by mouth once daily  . metFORMIN (GLUCOPHAGE) 1000 MG tablet TAKE 1 TABLET BY MOUTH TWICE DAILY WITH A MEAL  . Multiple Vitamins-Minerals (ZINC PO) Take 1 tablet by mouth.  . sacubitril-valsartan (ENTRESTO) 49-51 MG Take 1 tablet by mouth 2 (two) times daily.  Alveda Reasons 20 MG TABS tablet Take 1 tablet by mouth once daily     Allergies:   Penicillins and Sulfamethoxazole   Social  History   Tobacco Use  . Smoking status: Former Smoker    Packs/day: 1.00    Years: 50.00    Pack years: 50.00    Types: Cigarettes    Quit date: 12/15/2012    Years since quitting: 7.0  . Smokeless tobacco: Never Used  Substance Use Topics  . Alcohol use: No    Alcohol/week: 0.0 standard drinks  . Drug use: No     Family Hx: The patient's family history includes Bladder Cancer in his mother; Colon cancer in his father; Diabetes in his father; Heart attack (age of onset: 26) in his child; Heart attack (age of onset: 39) in his child; Heart disease in his father and mother.  ROS:   Please see the history of present illness.     All other systems reviewed and are negative.   Labs/Other Tests and Data Reviewed:    Recent Labs: 09/25/2019: ALT 15; TSH 2.38 12/12/2019: B Natriuretic Peptide 284.4; BUN 20; Creatinine, Ser 1.07; Hemoglobin 16.2; Platelets 176; Potassium 4.0; Sodium 139   Recent Lipid Panel Lab Results  Component Value Date/Time   CHOL 137 09/25/2019 10:43 AM   TRIG 119.0 09/25/2019 10:43 AM   TRIG 53 10/10/2006 11:13 AM   HDL 36.10 (L) 09/25/2019 10:43 AM   CHOLHDL 4 09/25/2019 10:43 AM   LDLCALC 78 09/25/2019 10:43 AM   LDLDIRECT 79.0 12/26/2018 09:41 AM    Wt Readings from Last 3 Encounters:  12/28/19 212 lb (96.2 kg)  12/12/19 212 lb (96.2 kg)  09/25/19 207 lb (93.9 kg)     Objective:    Vital Signs:  BP (!) 80/48   Pulse 83   Ht 6' (1.829 m)   Wt 212 lb (96.2 kg)   SpO2 96%   BMI 28.75 kg/m    CONSTITUTIONAL:  Well nourished, well developed male in no acute distress.  EYES: anicteric MOUTH: oral mucosa is pink RESPIRATORY: Normal respiratory effort, symmetric expansion CARDIOVASCULAR: No peripheral edema SKIN: No rash, lesions or ulcers MUSCULOSKELETAL: no digital cyanosis NEURO: Cranial Nerves II-XII grossly intact, moves all extremities PSYCH: Intact judgement and insight.  A&O x 3, Mood/affect appropriate   ASSESSMENT & PLAN:     1.  OSA -   The patient is tolerating PAP therapy well without any problems. The PAP download was reviewed today and showed an AHI of 1.4/hr on 8 cm H2O with 90% compliance in using more than 4 hours nightly.  The patient has been using and benefiting from PAP use and will continue to benefit from therapy. He does have some dry mouth and I encouraged him to get Biotene mouth wash and also consider changing to a full face mask.  He said  the chin strap did not help.  He will let me know if he wants to try a different mask.  2.  HTN -BP soft today -will have BP addressed by his Cardiologist Dr. Caryl Comes when he sees him today -continue Carvedilol 12.5mg  BID, dig 0.125mg  daily, Entresto 49-51mg  BID.  3.  Chronic atrial fibrillation -HR controlled -continue Carvedilol and Xarelto   Medication Adjustments/Labs and Tests Ordered: Current medicines are reviewed at length with the patient today.  Concerns regarding medicines are outlined above.  Tests Ordered: No orders of the defined types were placed in this encounter.  Medication Changes: No orders of the defined types were placed in this encounter.   Disposition:  Follow up in 1 year(s)  Signed, Fransico Him, MD  12/28/2019 1:17 PM    [facility]

## 2019-12-28 ENCOUNTER — Encounter: Payer: Self-pay | Admitting: Internal Medicine

## 2019-12-28 ENCOUNTER — Ambulatory Visit: Payer: Medicare HMO | Admitting: Cardiology

## 2019-12-28 ENCOUNTER — Encounter: Payer: Self-pay | Admitting: Cardiology

## 2019-12-28 ENCOUNTER — Ambulatory Visit (INDEPENDENT_AMBULATORY_CARE_PROVIDER_SITE_OTHER): Payer: Medicare HMO | Admitting: Internal Medicine

## 2019-12-28 ENCOUNTER — Other Ambulatory Visit: Payer: Self-pay

## 2019-12-28 VITALS — BP 80/48 | HR 83 | Ht 72.0 in | Wt 212.0 lb

## 2019-12-28 VITALS — BP 104/64 | HR 92 | Ht 72.0 in | Wt 212.0 lb

## 2019-12-28 DIAGNOSIS — I482 Chronic atrial fibrillation, unspecified: Secondary | ICD-10-CM | POA: Diagnosis not present

## 2019-12-28 DIAGNOSIS — I10 Essential (primary) hypertension: Secondary | ICD-10-CM | POA: Diagnosis not present

## 2019-12-28 DIAGNOSIS — E1159 Type 2 diabetes mellitus with other circulatory complications: Secondary | ICD-10-CM

## 2019-12-28 DIAGNOSIS — I5022 Chronic systolic (congestive) heart failure: Secondary | ICD-10-CM

## 2019-12-28 DIAGNOSIS — G4733 Obstructive sleep apnea (adult) (pediatric): Secondary | ICD-10-CM

## 2019-12-28 DIAGNOSIS — I152 Hypertension secondary to endocrine disorders: Secondary | ICD-10-CM

## 2019-12-28 NOTE — Progress Notes (Signed)
Patient Care Team: Marin Olp, MD as PCP - General (Family Medicine) Sueanne Margarita, MD as PCP - Sleep Medicine (Cardiology) Deboraha Sprang, MD as Consulting Physician (Cardiology) Magdalen Spatz, NP as Nurse Practitioner (Pulmonary Disease) Pieter Partridge, DO as Consulting Physician (Neurology) Opthamology, [facility] as Consulting Physician (Ophthalmology) Macario Carls, MD as Consulting Physician (Dermatology)   HPI  Carl Wood is a 79 y.o. male s seen in followup for ischemic heart disease with ICD implanted for ventricular tachycardia. He has history of bypass grafting and depressed left ventricular function. He has a history of recurrent ventricular tachycardia--most recently polymorphic in september 2101 associated with low-normal magnesium.  He underwent a Myoview scanning demonstrated ejection fraction of 27% without ischemia but with a large LAD infarct.   He has permanent atrial fibrillation.  Anticoagulated with Xarelto.  12/18 evaluation for chronic headaches showed bilateral small subdurals.  Xarelto was stopped subdurals resolved and Xarelto resumed.  No bleeding     DATE TEST EF   7/16 Echo    20 %   2/17 Cath   LAD disease  Nonviable ant wall   10/17 Echo   20-25%   8/19 Echo  20%   1/21 Echo  20-25%     He underwent CRT upgrade 4/18.--He had an IVCD but anterior precordial notching.   Date Cr K Hgb Dig  1/21 1.07 4.0 16.2 0.5           He continues to sleep a lot, in a recliner 2/2 back and hip pain. The patient denies chest pain , shortness of breath is at baseline, no nocturnal dyspnea but sleep in reclines, although 2/2 back No peripheral edema.  There have been no palpitations, lightheadedness or syncope.    Thromboembolic risk factors ( age -70 , HTN-1, TIA/CVA-2, DM-1, Vasc disease -1, CHF-1) for a CHADSVASc Score of 7   Past Medical History:  Diagnosis Date  . CAD (coronary artery disease)   . CHF (congestive heart failure)  ([place])   . Chronic atrial fibrillation ([place])   . Colon polyps   . Diabetes mellitus   . Diverticulosis of colon   . DIVERTICULOSIS, COLON 10/16/2006   Qualifier: Diagnosis of  By: Leanne Chang MD, [place]    . Excessive daytime sleepiness 02/19/2016  . Hyperlipidemia   . Hypertension   . MI (mitral incompetence)   . Nephrolithiasis   . NEPHROLITHIASIS 06/26/2008   Qualifier: Diagnosis of  By: Sherlynn Stalls, CMA, [place]    . OSA (obstructive sleep apnea) 02/19/2016   Moderate to severe OSA with an AHI of 25/hr and on CPAP at 8cm H2O  . PE (pulmonary embolism)   . V-tach [facility])     Past Surgical History:  Procedure Laterality Date  . BACK SURGERY    . BASAL CELL CARCINOMA EXCISION     nose  . BIV UPGRADE N/A 03/28/2017   Procedure: BiV Upgrade;  Surgeon: Deboraha Sprang, MD;  Location: [place] [facility];  Service: Cardiovascular;  Laterality: N/A;  . CARDIAC CATHETERIZATION N/A 01/20/2016   Procedure: Right/Left Heart Cath and Coronary Angiography;  Surgeon: Jolaine Artist, MD;  Location: [place] [facility];  Service: Cardiovascular;  Laterality: N/A;  . CARDIAC DEFIBRILLATOR PLACEMENT     medtronic virtuoso  . CHOLECYSTECTOMY    . COLONOSCOPY  12/11/2012   Procedure: COLONOSCOPY;  Surgeon: Inda Castle, MD;  Location: [facility];  Service: Endoscopy;  Laterality: N/A;  .  KNEE ARTHROPLASTY      Current Outpatient Medications  Medication Sig Dispense Refill  . ACCU-CHEK AVIVA PLUS test strip USE  STRIP TO CHECK GLUCOSE ONCE DAILY 100 each 4  . Ascorbic Acid (VITAMIN C PO) Take 1 tablet by mouth.    Marland Kitchen atorvastatin (LIPITOR) 80 MG tablet TAKE 1 TABLET BY MOUTH ONCE DAILY AS DIRECTED 90 tablet 2  . budesonide-formoterol (SYMBICORT) 80-4.5 MCG/ACT inhaler Inhale 2 puffs into the lungs 2 (two) times daily. 1 Inhaler 2  . carvedilol (COREG) 12.5 MG tablet Take 1 tablet (12.5 mg total) by mouth 2 (two) times daily with a meal. 180 tablet 3  . digoxin (LANOXIN) 0.25 MG tablet Take 1/2 (one-half)  tablet by mouth once daily. Please keep upcoming appt with Dr. Caryl Comes in January for future refills. 45 tablet 0  . fluticasone (FLONASE) 50 MCG/ACT nasal spray Place 1 spray into both nostrils daily. 16 g 6  . furosemide (LASIX) 20 MG tablet TAKE 1 TABLET BY MOUTH ONCE A WEEK 12 tablet 4  . glimepiride (AMARYL) 4 MG tablet TAKE 2 TABLETS BY MOUTH ONCE DAILY WITH BREAKFAST 180 tablet 3  . JARDIANCE 10 MG TABS tablet Take 1 tablet by mouth once daily 90 tablet 3  . metFORMIN (GLUCOPHAGE) 1000 MG tablet TAKE 1 TABLET BY MOUTH TWICE DAILY WITH A MEAL 180 tablet 0  . Multiple Vitamins-Minerals (ZINC PO) Take 1 tablet by mouth.    . sacubitril-valsartan (ENTRESTO) 49-51 MG Take 1 tablet by mouth 2 (two) times daily. 60 tablet 11  . XARELTO 20 MG TABS tablet Take 1 tablet by mouth once daily 30 tablet 0   No current facility-administered medications for this visit.    Allergies  Allergen Reactions  . Penicillins Other (See Comments)    Patient passed out  . Sulfamethoxazole Rash    Review of Systems negative except from HPI and PMH  Physical Exam BP 104/64   Pulse 92   Ht 6' (1.829 m)   Wt 212 lb (96.2 kg)   SpO2 95%   BMI 28.75 kg/m  Well developed and well nourished in no acute distress HENT normal Neck supple with JVP-flat Clear Device pocket well healed; without hematoma or erythema.  There is no tethering  Regular rate and rhythm, no   murmur Abd-soft with active BS No Clubbing cyanosis   edema Skin-warm and dry A & Oriented  Grossly normal sensory and motor function  ECG Personally reviewed from 12/12/2019 atrial fibrillation with ventricular pacing negative QRS lead I upright QRS lead V1  Assessment and  Plan  Atrial fibrillation-permanent  IVCD  Ischemic cardiomyopathy prior bypass surgery  ICD-Medtronic-CRT The patient's device was interrogated and the information was fully reviewed.  The device was reprogrammed to VVIR 80>>75    On Anticoagulation;  No bleeding  issues   Without symptoms of ischemia  Euvolemic continue current meds; sleeps in a recliner but because of arthritis issues  Given his atrial fibrillation, his lower rate limit had been increased to 80 bpm to try to minimize ventricular conduction and optimize biventricular pacing.  Current V sensed response is 2.3%.  We will decrease his lower rate limit from 80--75 and reassess this at his next check.  There are data identifying cardiomyopathic consequences of rapid ventricular pacing at rates of 80+.

## 2019-12-28 NOTE — Patient Instructions (Signed)

## 2019-12-28 NOTE — Patient Instructions (Signed)
Medication Instructions:  Your physician recommends that you continue on your current medications as directed. Please refer to the Current Medication list given to you today.  *If you need a refill on your cardiac medications before your next appointment, please call your pharmacy*  Follow-Up: At [facility], you and your health needs are our priority.  As part of our continuing mission to provide you with exceptional heart care, we have created designated Provider Care Teams.  These Care Teams include your primary Cardiologist (physician) and Advanced Practice Providers (APPs -  Physician Assistants and Nurse Practitioners) who all work together to provide you with the care you need, when you need it.    

## 2020-01-03 ENCOUNTER — Ambulatory Visit (INDEPENDENT_AMBULATORY_CARE_PROVIDER_SITE_OTHER): Payer: Medicare HMO | Admitting: Family Medicine

## 2020-01-03 ENCOUNTER — Other Ambulatory Visit: Payer: Self-pay

## 2020-01-03 ENCOUNTER — Encounter: Payer: Self-pay | Admitting: Family Medicine

## 2020-01-03 ENCOUNTER — Encounter: Payer: Medicare HMO | Admitting: Internal Medicine

## 2020-01-03 VITALS — BP 94/58 | HR 84 | Temp 98.0°F | Ht 72.0 in | Wt 212.8 lb

## 2020-01-03 DIAGNOSIS — J449 Chronic obstructive pulmonary disease, unspecified: Secondary | ICD-10-CM

## 2020-01-03 DIAGNOSIS — I5022 Chronic systolic (congestive) heart failure: Secondary | ICD-10-CM

## 2020-01-03 DIAGNOSIS — I2782 Chronic pulmonary embolism: Secondary | ICD-10-CM

## 2020-01-03 DIAGNOSIS — E1159 Type 2 diabetes mellitus with other circulatory complications: Secondary | ICD-10-CM | POA: Diagnosis not present

## 2020-01-03 DIAGNOSIS — E1169 Type 2 diabetes mellitus with other specified complication: Secondary | ICD-10-CM | POA: Diagnosis not present

## 2020-01-03 DIAGNOSIS — E119 Type 2 diabetes mellitus without complications: Secondary | ICD-10-CM

## 2020-01-03 DIAGNOSIS — E538 Deficiency of other specified B group vitamins: Secondary | ICD-10-CM | POA: Diagnosis not present

## 2020-01-03 DIAGNOSIS — Z0000 Encounter for general adult medical examination without abnormal findings: Secondary | ICD-10-CM | POA: Diagnosis not present

## 2020-01-03 DIAGNOSIS — I482 Chronic atrial fibrillation, unspecified: Secondary | ICD-10-CM

## 2020-01-03 DIAGNOSIS — E785 Hyperlipidemia, unspecified: Secondary | ICD-10-CM

## 2020-01-03 DIAGNOSIS — I152 Hypertension secondary to endocrine disorders: Secondary | ICD-10-CM

## 2020-01-03 DIAGNOSIS — I2510 Atherosclerotic heart disease of native coronary artery without angina pectoris: Secondary | ICD-10-CM

## 2020-01-03 DIAGNOSIS — I10 Essential (primary) hypertension: Secondary | ICD-10-CM

## 2020-01-03 NOTE — Progress Notes (Signed)
Phone: [phone]   Subjective:  Patient presents today for their annual physical. Chief complaint-noted.   See problem oriented charting- ROS- full  review of systems was completed and negative  except for: palpitations, some leg weakness  The following were reviewed and entered/updated in epic: Past Medical History:  Diagnosis Date  . CAD (coronary artery disease)   . CHF (congestive heart failure) ([place])   . Chronic atrial fibrillation ([place])   . Colon polyps   . Diabetes mellitus   . Diverticulosis of colon   . DIVERTICULOSIS, COLON 10/16/2006   Qualifier: Diagnosis of  By: Leanne Chang MD, [place]    . Excessive daytime sleepiness 02/19/2016  . Hyperlipidemia   . Hypertension   . MI (mitral incompetence)   . Nephrolithiasis   . NEPHROLITHIASIS 06/26/2008   Qualifier: Diagnosis of  By: Sherlynn Stalls, CMA, [place]    . OSA (obstructive sleep apnea) 02/19/2016   Moderate to severe OSA with an AHI of 25/hr and on CPAP at 8cm H2O  . PE (pulmonary embolism)   . V-tach [facility])    Patient Active Problem List   Diagnosis Date Noted  . COPD (chronic obstructive pulmonary disease) ([place]) 12/26/2018    Priority: High  . History of subdural hematoma 11/21/2017    Priority: High  . Chronic atrial fibrillation (HCC)     Priority: High  . Chronic systolic heart failure ([place]) 08/06/2015    Priority: High  . Former smoker 11/15/2014    Priority: High  . History of cardioembolic cerebrovascular accident (CVA) 12/19/2012    Priority: High  .  ventricular tachycardia-non sustained  02/17/2012    Priority: High  . Diabetes mellitus type II, controlled ([place]) 10/16/2006    Priority: High  . CAD (coronary artery disease) 10/16/2006    Priority: High  . Chronic pulmonary embolism ([place]) 10/16/2006    Priority: High  . Dyspnea on exertion 08/22/2017    Priority: Medium  . OSA (obstructive sleep apnea) 02/19/2016    Priority: Medium  . Carotid artery stenosis 03/17/2015    Priority: Medium  .  Implantable cardioverter-defibrillator (ICD) in situ 02/17/2012    Priority: Medium  . History of skin cancer 07/05/2007    Priority: Medium  . Hyperlipidemia associated with type 2 diabetes mellitus ([place]) 10/16/2006    Priority: Medium  . Hypertension associated with diabetes ([place]) 10/16/2006    Priority: Medium  . Osteoarthritis of right knee 09/22/2018    Priority: Low  . Frontal headache 10/06/2017    Priority: Low  . Rhinitis 08/22/2017    Priority: Low  . LBBB (left bundle branch block) 08/06/2015    Priority: Low  . Actinic keratosis 11/15/2014    Priority: Low  . Benign neoplasm of colon 12/11/2012    Priority: Low   Past Surgical History:  Procedure Laterality Date  . BACK SURGERY    . BASAL CELL CARCINOMA EXCISION     nose  . BIV UPGRADE N/A 03/28/2017   Procedure: BiV Upgrade;  Surgeon: Deboraha Sprang, MD;  Location: [place] [facility];  Service: Cardiovascular;  Laterality: N/A;  . CARDIAC CATHETERIZATION N/A 01/20/2016   Procedure: Right/Left Heart Cath and Coronary Angiography;  Surgeon: Jolaine Artist, MD;  Location: [place] [facility];  Service: Cardiovascular;  Laterality: N/A;  . CARDIAC DEFIBRILLATOR PLACEMENT     medtronic virtuoso  . CHOLECYSTECTOMY    . COLONOSCOPY  12/11/2012   Procedure: COLONOSCOPY;  Surgeon: Inda Castle, MD;  Location: [facility];  Service:  Endoscopy;  Laterality: N/A;  . KNEE ARTHROPLASTY      Family History  Problem Relation Age of Onset  . Colon cancer Father   . Diabetes Father   . Heart disease Father   . Bladder Cancer Mother   . Heart disease Mother   . Heart attack Child 28  . Heart attack Child 47    Medications- reviewed and updated Current Outpatient Medications  Medication Sig Dispense Refill  . ACCU-CHEK AVIVA PLUS test strip USE  STRIP TO CHECK GLUCOSE ONCE DAILY 100 each 4  . Ascorbic Acid (VITAMIN C PO) Take 1 tablet by mouth.    Marland Kitchen atorvastatin (LIPITOR) 80 MG tablet TAKE 1 TABLET BY MOUTH ONCE  DAILY AS DIRECTED 90 tablet 2  . budesonide-formoterol (SYMBICORT) 80-4.5 MCG/ACT inhaler Inhale 2 puffs into the lungs 2 (two) times daily. 1 Inhaler 2  . carvedilol (COREG) 12.5 MG tablet Take 1 tablet (12.5 mg total) by mouth 2 (two) times daily with a meal. 180 tablet 3  . digoxin (LANOXIN) 0.25 MG tablet Take 1/2 (one-half) tablet by mouth once daily. Please keep upcoming appt with Dr. Caryl Comes in January for future refills. 45 tablet 0  . fluticasone (FLONASE) 50 MCG/ACT nasal spray Place 1 spray into both nostrils daily. 16 g 6  . furosemide (LASIX) 20 MG tablet TAKE 1 TABLET BY MOUTH ONCE A WEEK 12 tablet 4  . glimepiride (AMARYL) 4 MG tablet TAKE 2 TABLETS BY MOUTH ONCE DAILY WITH BREAKFAST 180 tablet 3  . JARDIANCE 10 MG TABS tablet Take 1 tablet by mouth once daily 90 tablet 3  . metFORMIN (GLUCOPHAGE) 1000 MG tablet TAKE 1 TABLET BY MOUTH TWICE DAILY WITH A MEAL 180 tablet 0  . sacubitril-valsartan (ENTRESTO) 49-51 MG Take 1 tablet by mouth 2 (two) times daily. 60 tablet 11  . XARELTO 20 MG TABS tablet Take 1 tablet by mouth once daily 30 tablet 0  . Multiple Vitamins-Minerals (ZINC PO) Take 1 tablet by mouth.     No current facility-administered medications for this visit.    Allergies-reviewed and updated Allergies  Allergen Reactions  . Penicillins Other (See Comments)    Patient passed out  . Sulfamethoxazole Rash    Social History   Social History Narrative   Married. 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 TM normal. Bridge of nose normal. Eyelids  normal.  Neck: no thyromegaly or cervical lymphadenopathy  CV: RRR no murmurs rubs or gallops Lungs: CTAB no crackles, wheeze, rhonchi Abdomen: soft/nontender/nondistended/normal bowel sounds. No rebound or guarding.  Ext: no edema Skin: warm, dry Neuro: Moves slowly onto table. Musculoskeletal: Some back discomfort with laying onto table    Assessment and Plan  80 y.o. male presenting for annual physical.  Health Maintenance counseling: 1. Anticipatory guidance: Patient counseled regarding regular dental exams- q6 months advised- last 3-4 years ago, eye exams yes yearly,  avoiding smoking and second hand smoke yes , limiting alcohol to 0 beverages  per day.   2. Risk factor reduction:  Advised patient of need for regular exercise and diet rich and fruits and vegetables to reduce risk of heart attack and stroke. Exercise- yard work as able. Diet- eats at home. Reasonably healthy- low salt diet Wt Readings from Last 3 Encounters:  01/03/20 212 lb 12.8 oz (96.5 kg)  12/28/19 212 lb (96.2 kg)  12/28/19 212 lb (96.2 kg)  3. Immunizations/screenings/ancillary studies- had covid vaccine #1 Immunization History  Administered Date(s) Administered  . DTaP 09/01/2011  . Fluad Quad(high Dose 65+) 08/16/2019  . Influenza Split 09/08/2011  . Influenza Whole 08/23/2008, 09/10/2009, 09/04/2010  . Influenza, High Dose Seasonal PF 09/07/2013, 09/17/2014, 09/10/2016, 08/11/2017, 09/04/2018  . Influenza-Unspecified 09/03/2015  . PFIZER SARS-COV-2 Vaccination 12/21/2019  . Pneumococcal Conjugate-13 11/15/2014  . Pneumococcal Polysaccharide-23 09/08/2011  . Tdap 11/15/2014  . Zoster 02/06/2008  . Zoster Recombinat (Shingrix) 05/09/2018, 08/08/2018  4. Prostate cancer screening-  Beyond age based screening recommendations. No changes in urination patter - stable nocturia Lab Results  Component Value Date   PSA 0.85 10/27/2012   PSA 1.08 12/10/2008   PSA 1.41 02/06/2007   5. Colon cancer screening -  passed age based screening recommendations.  Originally was planned to have repeat colonoscopy in 2019-Dr. Danis was okay with not repeating due to complexity of other medical illnesses 6. Skin cancer screening- Dr. Pearline Cables with [facility]. advised regular sunscreen use. Denies worrisome, changing, or new skin lesions.  7. former smoker- 50 pack years quit smoking 2013. On lung cancer screening program with [facility]  Status of chronic or acute concerns   #Chronic systolic heart failure-follows with Dr. Haroldine Laws #ICD in place with history of ventricular tachycardia S: Compliant with carvedilol 12.5 mg twice a day, digoxin, Lasix 20 mg-once a week, Entresto.  Also on Jardiance for diabetes which is favorable for heart failure patients -ICD monitored by cardiology A/P: Doing well with CHF.  ICD remains in place.  Continue current medications and cardiology follow-up  # Atrial fibrillation chronic/ history of cardioemolic CVA/history subdural hematoma #Chronic Pulmonary Embolism"Has Also Had DVT in the past.  Chronic Xarelto S: Compliant with carvedilol 12.5 mg twice a day for rate control and Xarelto 20 mg for anticoagulation. -Does have history of subdural hematoma-Xarelto was held short-term but was restarted once resolved -History of cardioembolic strokes we prefer to have him on Xarelto as long as no falls A/P: CHF appears stable-remains on Xarelto to help reduce risk of stroke.  Luckily no falls  #CAD/hyperlipidemia/carotid artery stenosis S: History of MI in 1983.  Denies history of stents or bypass-streptokinase was used in the past per patient. -Due to bleeding risk is on Xarelto alone and not on aspirin -Patient compliant with atorvastatin 80 mg.  LDL goal under 70 but has preferred not to add Zetia  A/P: Reasonable control in the past on atorvastatin 80 mg though ideal LDL goal under 70-patient prefers not to add Zetia at this time -Carotid artery stenosis in 2015.  Only 1 to  39% stenosis-patient declines repeat testing up through 2021   #COPD- Compliant with Symbicort.  Has stable baseline shortness of breath  # Diabetes S: Compliant with metformin 1000mg  BID, jardiance 10mg  (q2 hour urination so wants to avoid increase ), amaryl 8 mg CBGs- Morning sugars looking better than last time 109-159. Exercise and diet-exercise limited by medical illness-discussed at least doing chair raises A/P: Hopefully stable-update A1c today  #Hypertension S: Compliant with carvedilol, Entresto, Lasix.  Blood pressure  traditionally runs very low but these medications have been required due to CHF  A/P: Overcontrolled but blood pressure medicines necessary-continue current medications  #OSA-compliant with CPAP  #Former smoker-enrolled in lung cancer screening program until no longer qualifies-potentially at 79  #Leg weakness-denies significant back pain today. Some leg weakness noted- discussed doing some chair raises starting out 10 a day building up to 10 reps 3x a day within a few weeks  #Right side low back pain still doing reasonably well other than with prolonged standing.   #Flushing-occasionally in the morning  #Verrucous area on his finger-currently we have previously used cryotherapy but warts seem to recur.  Patient would like to follow-up with dermatology Dr. Pearline Cables  #B12 deficiency-patient takes B12.  Update B12 level with labs  Recommended follow up: 51-month follow-up or sooner if needed Future Appointments  Date Time Provider [place]  01/29/2020  7:20 AM [facility] DEVICE REMOTES [facility]  04/29/2020  7:20 AM [facility] DEVICE REMOTES [facility]  07/03/2020  1:20 PM Marin Olp, MD [facility]  07/29/2020  7:20 AM [facility] DEVICE REMOTES [facility]  10/28/2020  7:20 AM [facility] DEVICE [facility]OTES [facility]     Lab/Order associations:not fasting   ICD-10-CM   1. Preventative  health care  Z00.00 CBC with Differential/Platelet    Comprehensive metabolic panel    LDL cholesterol, direct    Hemoglobin A1c    Vitamin B12  2. Controlled type 2 diabetes mellitus without complication, without long-term current use of insulin (HCC)  E11.9 CBC with Differential/Platelet    Comprehensive metabolic panel    LDL cholesterol, direct    Hemoglobin A1c  3. Chronic obstructive pulmonary disease, unspecified COPD type ([place])  J44.9   4. Chronic systolic heart failure (HCC)  I50.22   5. Other chronic pulmonary embolism without acute cor pulmonale (HCC)  I27.82   6. Chronic atrial fibrillation (HCC)  I48.20   7. Hypertension associated with diabetes ([place])  E11.59    I10   8. Hyperlipidemia associated with type 2 diabetes mellitus (HCC)  E11.69    E78.5   9. B12 deficiency  E53.8 Vitamin B12  10. Coronary artery disease involving native coronary artery of native heart without angina pectoris  I25.10    Return precautions advised.  [name], MD

## 2020-01-03 NOTE — Patient Instructions (Addendum)
Some leg weakness noted- discussed doing some chair raises starting out 10 a day building up to 10 reps 3x a day within a few weeks  No changes as long as a1c 8 or less and other labs look good  Please stop by lab before you go If you do not have mychart- we will call you about results within 5 business days of [place] receiving them.  If you have mychart- we will send your results within 3 business days of [place] receiving them.  If abnormal or we want to clarify a result, we will call or mychart you to make sure you receive the message.  If you have questions or concerns or don't hear within 5-7 days, please send [place] a message or call us.

## 2020-01-04 LAB — COMPREHENSIVE METABOLIC PANEL
ALT: 15 U/L (ref 0–53)
AST: 12 U/L (ref 0–37)
Albumin: 4.2 g/dL (ref 3.5–5.2)
Alkaline Phosphatase: 76 U/L (ref 39–117)
BUN: 21 mg/dL (ref 6–23)
CO2: 28 mEq/L (ref 19–32)
Calcium: 9.9 mg/dL (ref 8.4–10.5)
Chloride: 104 mEq/L (ref 96–112)
Creatinine, Ser: 0.95 mg/dL (ref 0.40–1.50)
GFR: 77.01 mL/min (ref >60.00)
Glucose, Bld: 180 mg/dL — ABNORMAL HIGH (ref 70–99)
Potassium: 4.6 mEq/L (ref 3.5–5.1)
Sodium: 139 mEq/L (ref 135–145)
Total Bilirubin: 0.6 mg/dL (ref 0.2–1.2)
Total Protein: 6.8 g/dL (ref 6.0–8.3)

## 2020-01-04 LAB — CBC WITH DIFFERENTIAL/PLATELET
Basophils Absolute: 0.1 10*3/uL (ref 0.0–0.1)
Basophils Relative: 0.7 % (ref 0.0–3.0)
Eosinophils Absolute: 0.2 10*3/uL (ref 0.0–0.7)
Eosinophils Relative: 2.2 % (ref 0.0–5.0)
HCT: 47.9 % (ref 39.0–52.0)
Hemoglobin: 15.9 g/dL (ref 13.0–17.0)
Lymphocytes Relative: 16.5 % (ref 12.0–46.0)
Lymphs Abs: 1.3 10*3/uL (ref 0.7–4.0)
MCHC: 33.1 g/dL (ref 30.0–36.0)
MCV: 99.1 fl (ref 78.0–100.0)
Monocytes Absolute: 0.7 10*3/uL (ref 0.1–1.0)
Monocytes Relative: 9.1 % (ref 3.0–12.0)
Neutro Abs: 5.7 10*3/uL (ref 1.4–7.7)
Neutrophils Relative %: 71.5 % (ref 43.0–77.0)
Platelets: 190 10*3/uL (ref 150.0–400.0)
RBC: 4.83 Mil/uL (ref 4.22–5.81)
RDW: 13.8 % (ref 11.5–15.5)
WBC: 8 10*3/uL (ref 4.0–10.5)

## 2020-01-04 LAB — HEMOGLOBIN A1C: Hgb A1c MFr Bld: 8.1 % — ABNORMAL HIGH (ref 4.6–6.5)

## 2020-01-04 LAB — LDL CHOLESTEROL, DIRECT: Direct LDL: 79 mg/dL

## 2020-01-04 LAB — VITAMIN B12: Vitamin B-12: 604 pg/mL (ref 211–911)

## 2020-01-07 ENCOUNTER — Other Ambulatory Visit: Payer: Self-pay | Admitting: Family Medicine

## 2020-01-10 ENCOUNTER — Telehealth: Payer: Self-pay | Admitting: Cardiology

## 2020-01-10 ENCOUNTER — Telehealth: Payer: Self-pay | Admitting: *Deleted

## 2020-01-10 DIAGNOSIS — G4733 Obstructive sleep apnea (adult) (pediatric): Secondary | ICD-10-CM

## 2020-01-10 NOTE — Telephone Encounter (Signed)
cpap supplies ordered

## 2020-01-10 NOTE — Telephone Encounter (Signed)
  Patient is calling because he needs a new mask, head strap, heated hose and ends from [facility] but when he called to check on it he was told that they had not received a new prescription

## 2020-01-10 NOTE — Telephone Encounter (Signed)
New prescription sent to [facility] and patient notified.

## 2020-01-15 DIAGNOSIS — G4733 Obstructive sleep apnea (adult) (pediatric): Secondary | ICD-10-CM | POA: Diagnosis not present

## 2020-01-21 ENCOUNTER — Other Ambulatory Visit (HOSPITAL_COMMUNITY): Payer: Self-pay | Admitting: Internal Medicine

## 2020-01-29 ENCOUNTER — Ambulatory Visit (INDEPENDENT_AMBULATORY_CARE_PROVIDER_SITE_OTHER): Payer: Medicare HMO | Admitting: *Deleted

## 2020-01-29 DIAGNOSIS — I5022 Chronic systolic (congestive) heart failure: Secondary | ICD-10-CM

## 2020-01-29 LAB — CUP PACEART REMOTE DEVICE CHECK
Battery Remaining Longevity: 30 mo
Battery Voltage: 2.95 V
Brady Statistic AP VP Percent: 0 %
Brady Statistic AP VS Percent: 0 %
Brady Statistic AS VP Percent: 0 %
Brady Statistic AS VS Percent: 0 %
Brady Statistic RA Percent Paced: 0 %
Brady Statistic RV Percent Paced: 98.56 %
Date Time Interrogation Session: 20210223012304
HighPow Impedance: 79 Ohm
Implantable Lead Implant Date: 20010723
Implantable Lead Implant Date: 20180423
Implantable Lead Location: 753858
Implantable Lead Location: 753860
Implantable Lead Model: 6943
Implantable Pulse Generator Implant Date: 20180423
Lead Channel Impedance Value: 1007 Ohm
Lead Channel Impedance Value: 1007 Ohm
Lead Channel Impedance Value: 266 Ohm
Lead Channel Impedance Value: 270.667
Lead Channel Impedance Value: 270.667
Lead Channel Impedance Value: 279.525
Lead Channel Impedance Value: 284.683
Lead Channel Impedance Value: 285 Ohm
Lead Channel Impedance Value: 4047 Ohm
Lead Channel Impedance Value: 418 Ohm
Lead Channel Impedance Value: 532 Ohm
Lead Channel Impedance Value: 532 Ohm
Lead Channel Impedance Value: 551 Ohm
Lead Channel Impedance Value: 589 Ohm
Lead Channel Impedance Value: 931 Ohm
Lead Channel Impedance Value: 950 Ohm
Lead Channel Impedance Value: 950 Ohm
Lead Channel Impedance Value: 988 Ohm
Lead Channel Pacing Threshold Amplitude: 1.25 V
Lead Channel Pacing Threshold Amplitude: 1.5 V
Lead Channel Pacing Threshold Pulse Width: 0.4 ms
Lead Channel Pacing Threshold Pulse Width: 1 ms
Lead Channel Sensing Intrinsic Amplitude: 9.5 mV
Lead Channel Sensing Intrinsic Amplitude: 9.5 mV
Lead Channel Setting Pacing Amplitude: 2 V
Lead Channel Setting Pacing Amplitude: 2.5 V
Lead Channel Setting Pacing Pulse Width: 0.4 ms
Lead Channel Setting Pacing Pulse Width: 1 ms
Lead Channel Setting Sensing Sensitivity: 0.3 mV

## 2020-01-30 NOTE — Progress Notes (Signed)
ICD Remote  

## 2020-02-08 ENCOUNTER — Telehealth (HOSPITAL_COMMUNITY): Payer: Self-pay

## 2020-02-08 NOTE — Telephone Encounter (Signed)
Lot number:[id number] Expiration: 06/22  Pt arrived to clinic to get samples. Gave 1 bottle of entresto 49/51.

## 2020-02-15 ENCOUNTER — Other Ambulatory Visit (HOSPITAL_COMMUNITY): Payer: Self-pay | Admitting: Cardiology

## 2020-02-15 MED ORDER — ENTRESTO 49-51 MG PO TABS: 1.0000 | ORAL_TABLET | Freq: Two times a day (BID) | ORAL | 0 refills | Status: DC

## 2020-02-15 NOTE — Telephone Encounter (Signed)
Pt called to request refill of medications until mail order comes in from [place]. Medications sent to [facility] as requested

## 2020-03-18 ENCOUNTER — Other Ambulatory Visit: Payer: Self-pay

## 2020-03-18 MED ORDER — DIGOXIN 250 MCG PO TABS: ORAL_TABLET | ORAL | 2 refills | Status: DC

## 2020-04-21 ENCOUNTER — Ambulatory Visit (INDEPENDENT_AMBULATORY_CARE_PROVIDER_SITE_OTHER): Payer: Medicare HMO | Admitting: Family Medicine

## 2020-04-21 ENCOUNTER — Other Ambulatory Visit: Payer: Self-pay

## 2020-04-21 ENCOUNTER — Encounter: Payer: Self-pay | Admitting: Family Medicine

## 2020-04-21 VITALS — BP 98/54 | HR 80 | Temp 98.7°F | Ht 72.0 in | Wt 207.0 lb

## 2020-04-21 DIAGNOSIS — M79644 Pain in right finger(s): Secondary | ICD-10-CM | POA: Diagnosis not present

## 2020-04-21 DIAGNOSIS — J449 Chronic obstructive pulmonary disease, unspecified: Secondary | ICD-10-CM | POA: Diagnosis not present

## 2020-04-21 DIAGNOSIS — E119 Type 2 diabetes mellitus without complications: Secondary | ICD-10-CM | POA: Diagnosis not present

## 2020-04-21 MED ORDER — METFORMIN HCL ER 500 MG PO TB24: 1000.0000 mg | ORAL_TABLET | Freq: Two times a day (BID) | ORAL | 11 refills | Status: DC

## 2020-04-21 NOTE — Progress Notes (Signed)
Phone [phone] In person visit   Subjective:   Carl Wood is a 79 y.o. year old very pleasant male patient who presents for/with See problem oriented charting Chief Complaint  Patient presents with  . Thumb Pain   This visit occurred during the SARS-CoV-2 public health emergency.  Safety protocols were in place, including screening questions prior to the visit, additional usage of staff PPE, and extensive cleaning of exam room while observing appropriate contact time as indicated for disinfecting solutions.   Past Medical History-  Patient Active Problem List   Diagnosis Date Noted  . COPD (chronic obstructive pulmonary disease) ([place]) 12/26/2018    Priority: High  . History of subdural hematoma 11/21/2017    Priority: High  . Chronic atrial fibrillation (HCC)     Priority: High  . Chronic systolic heart failure ([place]) 08/06/2015    Priority: High  . Former smoker 11/15/2014    Priority: High  . History of cardioembolic cerebrovascular accident (CVA) 12/19/2012    Priority: High  .  ventricular tachycardia-non sustained  02/17/2012    Priority: High  . Diabetes mellitus type II, controlled ([place]) 10/16/2006    Priority: High  . CAD (coronary artery disease) 10/16/2006    Priority: High  . Chronic pulmonary embolism ([place]) 10/16/2006    Priority: High  . Dyspnea on exertion 08/22/2017    Priority: Medium  . OSA (obstructive sleep apnea) 02/19/2016    Priority: Medium  . Carotid artery stenosis 03/17/2015    Priority: Medium  . Implantable cardioverter-defibrillator (ICD) in situ 02/17/2012    Priority: Medium  . History of skin cancer 07/05/2007    Priority: Medium  . Hyperlipidemia associated with type 2 diabetes mellitus ([place]) 10/16/2006    Priority: Medium  . Hypertension associated with diabetes ([place]) 10/16/2006    Priority: Medium  . Osteoarthritis of right knee 09/22/2018    Priority: Low  . Frontal headache 10/06/2017    Priority: Low  . Rhinitis  08/22/2017    Priority: Low  . LBBB (left bundle branch block) 08/06/2015    Priority: Low  . Actinic keratosis 11/15/2014    Priority: Low  . Benign neoplasm of colon 12/11/2012    Priority: Low    Medications- reviewed and updated Current Outpatient Medications  Medication Sig Dispense Refill  . ACCU-CHEK AVIVA PLUS test strip USE  STRIP TO CHECK GLUCOSE ONCE DAILY 100 each 4  . Ascorbic Acid (VITAMIN C PO) Take 1 tablet by mouth.    Marland Kitchen atorvastatin (LIPITOR) 80 MG tablet TAKE 1 TABLET BY MOUTH ONCE DAILY AS DIRECTED 90 tablet 2  . budesonide-formoterol (SYMBICORT) 80-4.5 MCG/ACT inhaler Inhale 2 puffs into the lungs 2 (two) times daily. 1 Inhaler 2  . carvedilol (COREG) 12.5 MG tablet Take 1 tablet (12.5 mg total) by mouth 2 (two) times daily with a meal. 180 tablet 3  . digoxin (LANOXIN) 0.25 MG tablet Take 1/2 (one-half) tablet by mouth once daily. 45 tablet 2  . fluticasone (FLONASE) 50 MCG/ACT nasal spray Place 1 spray into both nostrils daily. 16 g 6  . furosemide (LASIX) 20 MG tablet TAKE 1 TABLET BY MOUTH ONCE A WEEK 12 tablet 4  . glimepiride (AMARYL) 4 MG tablet TAKE 2 TABLETS BY MOUTH ONCE DAILY WITH BREAKFAST 180 tablet 3  . JARDIANCE 10 MG TABS tablet Take 1 tablet by mouth once daily 90 tablet 3  . metFORMIN (GLUCOPHAGE) 1000 MG tablet TAKE 1 TABLET BY MOUTH TWICE DAILY WITH A MEAL 180  tablet 1  . Multiple Vitamins-Minerals (ZINC PO) Take 1 tablet by mouth.    . sacubitril-valsartan (ENTRESTO) 49-51 MG Take 1 tablet by mouth 2 (two) times daily. 60 tablet 0  . XARELTO 20 MG TABS tablet Take 1 tablet by mouth once daily 30 tablet 3   No current facility-administered medications for this visit.     Objective:  BP (!) 98/54   Pulse 80   Temp 98.7 F (37.1 C) (Temporal)   Ht 6' (1.829 m)   Wt 207 lb (93.9 kg)   SpO2 94%   BMI 28.07 kg/m  Gen: NAD, resting comfortably CV: Normal rate at 80.  Irregularly irregular Abdomen: soft/nondistended Right first MCP joint  with tenderness to palpation-patient also has tenderness with opposition to his thumb.  When I palpate his [place] joint-he has some radiation to the MCP joint.  Also has some mild tenderness at the IP joint of first finger.  Mild swelling at the MCP joint compared to the left hand-no pain with palpation over the left hand    Assessment and Plan   #  Thumb Pain (right thumb)  S: started about 5 weeks ago the evening after cleaning some stuff up and taking things to the dump. That evening had some tenderness and tried voltaren gel. Later that evening.  the thumb swole up at the MCP joint and noted a small bite/hole right over the joint- had redness/swelling.  Denies any injury or trauma other than potential bite. Swelling lasted about 3-4 days then improved ut still larger than the other hand.  Has not tried heat or ice. Prior to this never had pain in the joint.   He states it remains Sensitive to touch and some movements. Can get shooting/burning pain with some movements.   . At the highest his pain has gotten to 5/10. Right now pain level is 1/10. Has most pain when opening jar or trying to hold pen. Describes pain as shooting burning that goes away quickly.  A/P: From AVS:  " I strongly suspect you have underlying arthritis in your right thumb -I think when you were active a few weeks ago you may have tweaked/flare of this arthritis -I want you to ice the thumb for 5 to 10 minutes 2 or 3 times a day for 3 days -I want you to use Voltaren gel at least 3 times a day for the next week. -If no better in a week please let me know and we will get an x-ray of the thumb"   # Diabetes S: Medication: Jardiance 10 mg, glimepiride 8 mg, Metformin 1000 mg twice daily -loose stools of metformin IR- when restarts up to 3 ore more a day- on it regularly may be twice a day.  CBGs- tried to stop metformin for a few days but sugar went up to 190.  Lab Results  Component Value Date   HGBA1C 8.1 (H) 01/03/2020    HGBA1C 8.0 (H) 09/25/2019   HGBA1C 7.3 (A) 06/19/2019   A/P: Hopefully diabetes is improving-unfortunately Metformin instant release is causing diarrhea.  We are going to stop the Metformin instant release and start Metformin extended release to see if this helps.  Continue Jardiance and glimepiride-he has a visit with me in July and we will update blood work including A1c  Recommended follow up: July or sooner if needed for thumb Future Appointments  Date Time Provider [place]  04/29/2020  7:20 AM [facility] DEVICE REMOTES [facility]  07/03/2020  1:20 PM Marin Olp, MD [facility]  07/29/2020  7:20 AM [facility] DEVICE REMOTES [facility]  10/28/2020  7:20 AM [facility] DEVICE [facility]OTES [facility]    Lab/Order associations:   ICD-10-CM   1. Pain of right thumb  M79.644   2. Controlled type 2 diabetes mellitus without complication, without long-term current use of insulin (HCC)  E11.9     Meds ordered this encounter  Medications  . metFORMIN (GLUCOPHAGE-XR) 500 MG 24 hr tablet    Sig: Take 2 tablets (1,000 mg total) by mouth in the morning and at bedtime.    Dispense:  120 tablet    Refill:  11    Ok to substitute other generic metformin extended release   Time Spent: 30 minutes of total time (1:10 PM- 1:40 PM) was spent on the date of the encounter performing the following actions: chart review prior to seeing the patient, obtaining history, performing a medically necessary exam, counseling on the treatment plan, placing orders, and documenting in our EHR.   Return precautions advised.  [name], MD

## 2020-04-21 NOTE — Patient Instructions (Addendum)
I strongly suspect you have underlying arthritis in your right thumb -I think when you were active a few weeks ago you may have tweaked/flare of this arthritis -I want you to ice the thumb for 5 to 10 minutes 2 or 3 times a day for 3 days -I want you to use Voltaren gel at least 3 times a day for the next week. -If no better in a week please let me know and we will get an x-ray of the thumb  Hopefully diabetes is improving-unfortunately Metformin instant release is causing diarrhea.  We are going to stop the Metformin instant release and start Metformin extended release to see if this helps.  Continue Jardiance and glimepiride-he has a visit with me in July and we will update blood work including A1c  Recommended follow up: July visit or sooner if you need [place]

## 2020-04-29 ENCOUNTER — Ambulatory Visit (INDEPENDENT_AMBULATORY_CARE_PROVIDER_SITE_OTHER): Payer: Medicare HMO | Admitting: *Deleted

## 2020-04-29 DIAGNOSIS — I4729 Other ventricular tachycardia: Secondary | ICD-10-CM

## 2020-04-29 DIAGNOSIS — I472 Ventricular tachycardia: Secondary | ICD-10-CM | POA: Diagnosis not present

## 2020-04-29 LAB — CUP PACEART REMOTE DEVICE CHECK
Battery Remaining Longevity: 28 mo
Battery Voltage: 2.95 V
Brady Statistic AP VP Percent: 0 %
Brady Statistic AP VS Percent: 0 %
Brady Statistic AS VP Percent: 0 %
Brady Statistic AS VS Percent: 0 %
Brady Statistic RA Percent Paced: 0 %
Brady Statistic RV Percent Paced: 98.58 %
Date Time Interrogation Session: 20210525012503
HighPow Impedance: 78 Ohm
Implantable Lead Implant Date: 20010723
Implantable Lead Implant Date: 20180423
Implantable Lead Location: 753858
Implantable Lead Location: 753860
Implantable Lead Model: 6943
Implantable Pulse Generator Implant Date: 20180423
Lead Channel Impedance Value: 1007 Ohm
Lead Channel Impedance Value: 1007 Ohm
Lead Channel Impedance Value: 1026 Ohm
Lead Channel Impedance Value: 275.5 Ohm
Lead Channel Impedance Value: 275.5 Ohm
Lead Channel Impedance Value: 275.5 Ohm
Lead Channel Impedance Value: 284.683
Lead Channel Impedance Value: 284.683
Lead Channel Impedance Value: 285 Ohm
Lead Channel Impedance Value: 4047 Ohm
Lead Channel Impedance Value: 418 Ohm
Lead Channel Impedance Value: 551 Ohm
Lead Channel Impedance Value: 551 Ohm
Lead Channel Impedance Value: 551 Ohm
Lead Channel Impedance Value: 589 Ohm
Lead Channel Impedance Value: 988 Ohm
Lead Channel Impedance Value: 988 Ohm
Lead Channel Impedance Value: 988 Ohm
Lead Channel Pacing Threshold Amplitude: 1.125 V
Lead Channel Pacing Threshold Amplitude: 1.75 V
Lead Channel Pacing Threshold Pulse Width: 0.4 ms
Lead Channel Pacing Threshold Pulse Width: 1 ms
Lead Channel Sensing Intrinsic Amplitude: 11.75 mV
Lead Channel Sensing Intrinsic Amplitude: 11.75 mV
Lead Channel Setting Pacing Amplitude: 2.5 V
Lead Channel Setting Pacing Amplitude: 2.75 V
Lead Channel Setting Pacing Pulse Width: 0.4 ms
Lead Channel Setting Pacing Pulse Width: 1 ms
Lead Channel Setting Sensing Sensitivity: 0.3 mV

## 2020-04-29 NOTE — Progress Notes (Signed)
Remote ICD transmission.   

## 2020-05-21 ENCOUNTER — Other Ambulatory Visit (HOSPITAL_COMMUNITY): Payer: Self-pay | Admitting: Internal Medicine

## 2020-05-21 MED ORDER — RIVAROXABAN 20 MG PO TABS: 20.0000 mg | ORAL_TABLET | Freq: Every day | ORAL | 3 refills | Status: DC

## 2020-05-22 ENCOUNTER — Other Ambulatory Visit (HOSPITAL_COMMUNITY): Payer: Self-pay | Admitting: Internal Medicine

## 2020-06-20 ENCOUNTER — Telehealth (HOSPITAL_COMMUNITY): Payer: Self-pay | Admitting: Cardiology

## 2020-06-20 NOTE — Telephone Encounter (Signed)
Medication Samples have been provided to the patient.  Drug name: entresto       Strength: 49/51        Qty: 28  LOT: [id number]  Exp.Date: 04/2022  Dosing instructions: one tab twice daily  The patient has been instructed regarding the correct time, dose, and frequency of taking this medication, including desired effects and most common side effects.   Garlan Fair M 12:56 PM 06/20/2020

## 2020-06-24 DIAGNOSIS — L819 Disorder of pigmentation, unspecified: Secondary | ICD-10-CM | POA: Diagnosis not present

## 2020-06-24 DIAGNOSIS — L570 Actinic keratosis: Secondary | ICD-10-CM | POA: Diagnosis not present

## 2020-06-24 DIAGNOSIS — L814 Other melanin hyperpigmentation: Secondary | ICD-10-CM | POA: Diagnosis not present

## 2020-06-24 DIAGNOSIS — B079 Viral wart, unspecified: Secondary | ICD-10-CM | POA: Diagnosis not present

## 2020-06-24 DIAGNOSIS — D485 Neoplasm of uncertain behavior of skin: Secondary | ICD-10-CM | POA: Diagnosis not present

## 2020-06-24 DIAGNOSIS — L821 Other seborrheic keratosis: Secondary | ICD-10-CM | POA: Diagnosis not present

## 2020-06-24 DIAGNOSIS — D1801 Hemangioma of skin and subcutaneous tissue: Secondary | ICD-10-CM | POA: Diagnosis not present

## 2020-07-03 ENCOUNTER — Other Ambulatory Visit: Payer: Self-pay

## 2020-07-03 ENCOUNTER — Ambulatory Visit (INDEPENDENT_AMBULATORY_CARE_PROVIDER_SITE_OTHER): Payer: Medicare HMO | Admitting: Family Medicine

## 2020-07-03 ENCOUNTER — Encounter: Payer: Self-pay | Admitting: Family Medicine

## 2020-07-03 VITALS — BP 100/62 | HR 87 | Temp 97.7°F | Ht 72.0 in | Wt 204.0 lb

## 2020-07-03 DIAGNOSIS — E119 Type 2 diabetes mellitus without complications: Secondary | ICD-10-CM | POA: Diagnosis not present

## 2020-07-03 DIAGNOSIS — I482 Chronic atrial fibrillation, unspecified: Secondary | ICD-10-CM | POA: Diagnosis not present

## 2020-07-03 DIAGNOSIS — Z1159 Encounter for screening for other viral diseases: Secondary | ICD-10-CM

## 2020-07-03 DIAGNOSIS — I5022 Chronic systolic (congestive) heart failure: Secondary | ICD-10-CM

## 2020-07-03 DIAGNOSIS — I2510 Atherosclerotic heart disease of native coronary artery without angina pectoris: Secondary | ICD-10-CM | POA: Diagnosis not present

## 2020-07-03 DIAGNOSIS — J449 Chronic obstructive pulmonary disease, unspecified: Secondary | ICD-10-CM | POA: Diagnosis not present

## 2020-07-03 DIAGNOSIS — M79644 Pain in right finger(s): Secondary | ICD-10-CM

## 2020-07-03 MED ORDER — ATORVASTATIN CALCIUM 40 MG PO TABS: 40.0000 mg | ORAL_TABLET | Freq: Every day | ORAL | 3 refills | Status: DC

## 2020-07-03 NOTE — Progress Notes (Signed)
Phone [phone] In person visit   Subjective:   Carl Wood is a 79 y.o. year old very pleasant male patient who presents for/with See problem oriented charting Chief Complaint  Patient presents with  . Pain    right thumb   . Diabetes  . Epistaxis    left side 7 times a week for about 4 mo   This visit occurred during the SARS-CoV-2 public health emergency.  Safety protocols were in place, including screening questions prior to the visit, additional usage of staff PPE, and extensive cleaning of exam room while observing appropriate contact time as indicated for disinfecting solutions.   Past Medical History-  Patient Active Problem List   Diagnosis Date Noted  . COPD (chronic obstructive pulmonary disease) ([place]) 12/26/2018    Priority: High  . History of subdural hematoma 11/21/2017    Priority: High  . Chronic atrial fibrillation (HCC)     Priority: High  . Chronic systolic heart failure ([place]) 08/06/2015    Priority: High  . Former smoker 11/15/2014    Priority: High  . History of cardioembolic cerebrovascular accident (CVA) 12/19/2012    Priority: High  .  ventricular tachycardia-non sustained  02/17/2012    Priority: High  . Diabetes mellitus type II, controlled ([place]) 10/16/2006    Priority: High  . CAD (coronary artery disease) 10/16/2006    Priority: High  . Chronic pulmonary embolism ([place]) 10/16/2006    Priority: High  . Dyspnea on exertion 08/22/2017    Priority: Medium  . OSA (obstructive sleep apnea) 02/19/2016    Priority: Medium  . Carotid artery stenosis 03/17/2015    Priority: Medium  . Implantable cardioverter-defibrillator (ICD) in situ 02/17/2012    Priority: Medium  . History of skin cancer 07/05/2007    Priority: Medium  . Hyperlipidemia associated with type 2 diabetes mellitus ([place]) 10/16/2006    Priority: Medium  . Hypertension associated with diabetes ([place]) 10/16/2006    Priority: Medium  . Osteoarthritis of right knee 09/22/2018     Priority: Low  . Frontal headache 10/06/2017    Priority: Low  . Rhinitis 08/22/2017    Priority: Low  . LBBB (left bundle branch block) 08/06/2015    Priority: Low  . Actinic keratosis 11/15/2014    Priority: Low  . Benign neoplasm of colon 12/11/2012    Priority: Low    Medications- reviewed and updated Current Outpatient Medications  Medication Sig Dispense Refill  . ACCU-CHEK AVIVA PLUS test strip USE  STRIP TO CHECK GLUCOSE ONCE DAILY 100 each 4  . Ascorbic Acid (VITAMIN C PO) Take 1 tablet by mouth.    Marland Kitchen atorvastatin (LIPITOR) 80 MG tablet TAKE 1 TABLET BY MOUTH ONCE DAILY AS DIRECTED 90 tablet 2  . budesonide-formoterol (SYMBICORT) 80-4.5 MCG/ACT inhaler Inhale 2 puffs into the lungs 2 (two) times daily. 1 Inhaler 2  . carvedilol (COREG) 12.5 MG tablet Take 1 tablet (12.5 mg total) by mouth 2 (two) times daily with a meal. 180 tablet 3  . digoxin (LANOXIN) 0.25 MG tablet Take 1/2 (one-half) tablet by mouth once daily. 45 tablet 2  . fluticasone (FLONASE) 50 MCG/ACT nasal spray Place 1 spray into both nostrils daily. 16 g 6  . furosemide (LASIX) 20 MG tablet TAKE 1 TABLET BY MOUTH ONCE A WEEK 12 tablet 4  . glimepiride (AMARYL) 4 MG tablet TAKE 2 TABLETS BY MOUTH ONCE DAILY WITH BREAKFAST 180 tablet 3  . JARDIANCE 10 MG TABS tablet Take 1 tablet by  mouth once daily 90 tablet 3  . metFORMIN (GLUCOPHAGE-XR) 500 MG 24 hr tablet Take 2 tablets (1,000 mg total) by mouth in the morning and at bedtime. 120 tablet 11  . Multiple Vitamins-Minerals (ZINC PO) Take 1 tablet by mouth.    . sacubitril-valsartan (ENTRESTO) 49-51 MG Take 1 tablet by mouth 2 (two) times daily. 60 tablet 0  . XARELTO 20 MG TABS tablet Take 1 tablet by mouth once daily 90 tablet 3   No current facility-administered medications for this visit.     Objective:  BP (!) 100/62   Pulse 87   Temp 97.7 F (36.5 C) (Temporal)   Ht 6' (1.829 m)   Wt (!) 204 lb (92.5 kg)   SpO2 95%   BMI 27.67 kg/m  Gen: NAD,  resting comfortably CV: irregularly irregular no murmurs rubs or gallops Lungs: CTAB no crackles, wheeze, rhonchi Abdomen: soft/nontender/nondistended/normal bowel sounds.  Ext: no edema, right thumb distal to IP but before nail painful with palpation- no skin changes Skin: warm, dry  Diabetic Foot Exam - Simple   Simple Foot Form Diabetic Foot exam was performed with the following findings: Yes 07/03/2020  1:25 PM  Visual Inspection No deformities, no ulcerations, no other skin breakdown bilaterally: Yes Sensation Testing Intact to touch and monofilament testing bilaterally: Yes Pulse Check Posterior Tibialis and Dorsalis pulse intact bilaterally: Yes Comments       Assessment and Plan   # Right Thumb pain  S:right thumb pain.  Last visit was having pain at the IP joint on the right thumb-now pain has settled in slightly distally to this.  He tried Voltaren gel without relief.  At last visit and may have been going on 5 weeks prior to that.  He also tried icing without relief- actually hurt A/P: Unclear etiology.  In the location where the pain is I do not suspect arthritis as much now.  Offered referral to sports medicine or x-ray but he wants to hold off for now-he will let me know if he changes his mind   # nose bleed / epistaxis.  S:started about three months ago. Has had daily lasting about 10 minutes each- always once a day but can be 2-3x a day. He does take Xarelto. Never on right side always on left side. Not using flonase for allergies at present or recently. Has used saline in the past.   Had similar a few years ago and I sent to ENT and they coudlnt see anything.   Balls up tissue and puts it in the nose- fills up and then 2nd time it slows down. Sleeps with cpap with humidifier A/P: Unclear etiology.  He has had further evaluation in the past with ENT and wants to hold off at this time going back to see them.  We discussed holding the bridge of the nose for 10 to 15  minutes without stopping next time this occurs-it could be the tissues are causing recurrent irritation.  Thankfully he is not on Flonase.  The Xarelto may be the main culprit but obviously we need to continue that for now.  #Chronic systolic heart failure-follows with Dr. Haroldine Laws #ICD in place with history of ventricular tachycardia S: Compliant with carvedilol 12.5 mg twice a day, digoxin, Lasix 20 mg-once a week as needed if gains 3 lbs but he has not- has been off for a month without issues, Entresto.  Also on Jardiance for diabetes which is favorable for heart failure patients -ICD monitored by  cardiology A/P: Stable. Continue current medications.   Defibrillator has not gone off  # Atrial fibrillation chronic/ history of cardioemolic CVA/history subdural hematoma S: Compliant with carvedilol 12.5 mg twice a day for rate control and Xarelto 20 mg for anticoagulation. -Does have history of subdural hematoma-Xarelto was held short-term but was restarted once resolved -History of cardioembolic strokes we prefer to have him on Xarelto as long as no falls A/P: doing well-appropriately anticoagulated and no signs of recurrent subdural hematoma.  Continue current medications for anticoagulation and carvedilol for rate control  #CAD/hyperlipidemia/carotid artery stenosis S: History of MI in 1983.  Denies history of stents or bypass-streptokinase was used in the past per patient. No chest pain or shortness of breath (above baseline) reported  -Due to bleeding risk is on Xarelto alone and not on aspirin -Patient compliant with atorvastatin 20 mg (he has been taking half tablet every other day for years- I had not realized that).  LDL goal under 70 but has preferred not to add Zetia  -Carotid artery stenosis in 2015.  Only 1 to 39% stenosis-patient declines repeat testing up through 2021- prefers to continue to avoid this  Lab Results  Component Value Date   CHOL 137 09/25/2019   HDL 36.10 (L)  09/25/2019   LDLCALC 78 09/25/2019   LDLDIRECT 79.0 01/03/2020   TRIG 119.0 09/25/2019   CHOLHDL 4 09/25/2019  A/P: Coronary artery disease is asymptomatic at this time.  Continue Xarelto alone without aspirin due to bleeding risk.  In regards to his statin-he actually has been taking 40 mg every other day which is equivalent to 20 mg daily-LDL has been slightly high at 78 and we opted to change prescription to 40 mg daily.  Recheck lipids next visit  #COPD S: Compliant with Symbicort.  A/P: reasonable control though mask makes breathing harder. Will continue rx. Declines albuterol.    # Diabetes S: Compliant with metformin 1000mg  BID XR (still with some looser stools but not as bad on IR), jardiance 10mg  (q2 hour urination so wants to avoid increase ), amaryl 8 mg CBGs- 130-140 in AM Lab Results  Component Value Date   HGBA1C 8.1 (H) 01/03/2020  A/P: hopefully controlled - will target a1c at least below 8.5- update a1c today  #Hypertension S: Compliant with carvedilol, Entresto, Lasix.  Blood pressure traditionally runs very low but these medications have been required due to CHF  A/P: Stable. Continue current medications.    #OSA-compliant with CPAP  #Former smoker-enrolled in lung cancer screening program until no longer qualifies-potentially at 79  Recommended follow up: Return in about 4 months (around 11/03/2020) for follow up- or sooner if needed. Future Appointments  Date Time Provider [place]  07/24/2020  1:00 PM Bensimhon, Shaune Pascal, MD [facility] None  07/29/2020  7:20 AM [facility] DEVICE REMOTES [facility]  10/28/2020  7:20 AM [facility] DEVICE REMOTES [facility]   Lab/Order associations:   ICD-10-CM   1. Controlled type 2 diabetes mellitus without complication, without long-term current use of insulin (HCC)  E11.9 Hemoglobin A1c    Lipid panel    CBC with Differential/Platelet    Comprehensive metabolic panel  2. Chronic  obstructive pulmonary disease, unspecified COPD type ([place])  J44.9   3. Encounter for hepatitis C screening test for low risk patient  Z11.59 Hepatitis C antibody  4. Chronic systolic heart failure (HCC)  I50.22   5. Chronic atrial fibrillation (HCC)  I48.20   6. Coronary artery disease involving native coronary artery  of native heart without angina pectoris  I25.10   7. Pain of right thumb  M79.644     Meds ordered this encounter  Medications  . atorvastatin (LIPITOR) 40 MG tablet    Sig: Take 1 tablet (40 mg total) by mouth daily.    Dispense:  90 tablet    Refill:  3    Return precautions advised.  [name], MD

## 2020-07-03 NOTE — Patient Instructions (Addendum)
Health Maintenance Due  Topic Date Due  . Hepatitis C Screening ordered today  Never done  . HEMOGLOBIN A1C ordered to day  07/02/2020    Please stop by lab before you go If you have mychart- we will send your results within 3 business days of [place] receiving them.  If you do not have mychart- we will call you about results within 5 business days of [place] receiving them.   Let us know if you change your mind on the thumb- can do x-ray or refer to sports medicine  Start atorvastatin 40mg  daily- instead of 80mg  half tablet every other day- I hope we can get LDL below 70 by follow up. If under 70 on labs today for LDL- we can switch to 20mg  instead   Recommended follow up: Return in about 4 months (around 11/03/2020) for follow up- or sooner if needed.

## 2020-07-03 NOTE — Addendum Note (Signed)
Addended by: Liliane Channel on: 07/03/2020 02:16 PM   Modules accepted: Orders

## 2020-07-04 LAB — CBC WITH DIFFERENTIAL/PLATELET
Absolute Monocytes: 564 cells/uL (ref 200–950)
Basophils Absolute: 37 cells/uL (ref 0–200)
Basophils Relative: 0.6 %
Eosinophils Absolute: 180 cells/uL (ref 15–500)
Eosinophils Relative: 2.9 %
HCT: 48.1 % (ref 38.5–50.0)
Hemoglobin: 16.1 g/dL (ref 13.2–17.1)
Lymphs Abs: 1054 cells/uL (ref 850–3900)
MCH: 32.7 pg (ref 27.0–33.0)
MCHC: 33.5 g/dL (ref 32.0–36.0)
MCV: 97.8 fL (ref 80.0–100.0)
MPV: 11.1 fL (ref 7.5–12.5)
Monocytes Relative: 9.1 %
Neutro Abs: 4365 cells/uL (ref 1500–7800)
Neutrophils Relative %: 70.4 %
Platelets: 185 10*3/uL (ref 140–400)
RBC: 4.92 10*6/uL (ref 4.20–5.80)
RDW: 12.1 % (ref 11.0–15.0)
Total Lymphocyte: 17 %
WBC: 6.2 10*3/uL (ref 3.8–10.8)

## 2020-07-04 LAB — LIPID PANEL
Cholesterol: 128 mg/dL (ref <200)
HDL: 33 mg/dL — ABNORMAL LOW (ref >=40)
LDL Cholesterol (Calc): 76 mg/dL (calc)
Non-HDL Cholesterol (Calc): 95 mg/dL (calc) (ref <130)
Total CHOL/HDL Ratio: 3.9 (calc) (ref <5.0)
Triglycerides: 104 mg/dL (ref <150)

## 2020-07-04 LAB — COMPREHENSIVE METABOLIC PANEL
AG Ratio: 1.6 (calc) (ref 1.0–2.5)
ALT: 15 U/L (ref 9–46)
AST: 14 U/L (ref 10–35)
Albumin: 4.3 g/dL (ref 3.6–5.1)
Alkaline phosphatase (APISO): 63 U/L (ref 35–144)
BUN: 22 mg/dL (ref 7–25)
CO2: 27 mmol/L (ref 20–32)
Calcium: 9.6 mg/dL (ref 8.6–10.3)
Chloride: 104 mmol/L (ref 98–110)
Creat: 0.88 mg/dL (ref 0.70–1.18)
Globulin: 2.7 g/dL (calc) (ref 1.9–3.7)
Glucose, Bld: 148 mg/dL — ABNORMAL HIGH (ref 65–99)
Potassium: 5.1 mmol/L (ref 3.5–5.3)
Sodium: 140 mmol/L (ref 135–146)
Total Bilirubin: 1 mg/dL (ref 0.2–1.2)
Total Protein: 7 g/dL (ref 6.1–8.1)

## 2020-07-04 LAB — HEMOGLOBIN A1C
Hgb A1c MFr Bld: 7.7 % of total Hgb — ABNORMAL HIGH (ref <5.7)
Mean Plasma Glucose: 174 (calc)
eAG (mmol/L): 9.7 (calc)

## 2020-07-04 LAB — HEPATITIS C ANTIBODY
Hepatitis C Ab: NONREACTIVE
SIGNAL TO CUT-OFF: 0.03 (ref <1.00)

## 2020-07-12 ENCOUNTER — Other Ambulatory Visit: Payer: Self-pay | Admitting: Family Medicine

## 2020-07-22 DIAGNOSIS — E119 Type 2 diabetes mellitus without complications: Secondary | ICD-10-CM | POA: Diagnosis not present

## 2020-07-22 DIAGNOSIS — H2513 Age-related nuclear cataract, bilateral: Secondary | ICD-10-CM | POA: Diagnosis not present

## 2020-07-22 DIAGNOSIS — D3131 Benign neoplasm of right choroid: Secondary | ICD-10-CM | POA: Diagnosis not present

## 2020-07-22 DIAGNOSIS — H52203 Unspecified astigmatism, bilateral: Secondary | ICD-10-CM | POA: Diagnosis not present

## 2020-07-22 LAB — HM DIABETES EYE EXAM

## 2020-07-23 ENCOUNTER — Encounter: Payer: Self-pay | Admitting: Family Medicine

## 2020-07-24 ENCOUNTER — Other Ambulatory Visit: Payer: Self-pay

## 2020-07-24 ENCOUNTER — Ambulatory Visit (HOSPITAL_COMMUNITY)
Admission: RE | Admit: 2020-07-24 | Discharge: 2020-07-24 | Disposition: A | Discharge status: Home / Self Care | Payer: Medicare HMO | Source: Ambulatory Visit | Attending: Internal Medicine | Admitting: Internal Medicine

## 2020-07-24 ENCOUNTER — Encounter (HOSPITAL_COMMUNITY): Payer: Self-pay | Admitting: Internal Medicine

## 2020-07-24 VITALS — BP 100/58 | HR 83 | Ht 72.0 in | Wt 205.0 lb

## 2020-07-24 DIAGNOSIS — Z9049 Acquired absence of other specified parts of digestive tract: Secondary | ICD-10-CM | POA: Insufficient documentation

## 2020-07-24 DIAGNOSIS — Z7984 Long term (current) use of oral hypoglycemic drugs: Secondary | ICD-10-CM | POA: Diagnosis not present

## 2020-07-24 DIAGNOSIS — Z85828 Personal history of other malignant neoplasm of skin: Secondary | ICD-10-CM | POA: Diagnosis not present

## 2020-07-24 DIAGNOSIS — Z7951 Long term (current) use of inhaled steroids: Secondary | ICD-10-CM | POA: Diagnosis not present

## 2020-07-24 DIAGNOSIS — Z8719 Personal history of other diseases of the digestive system: Secondary | ICD-10-CM | POA: Diagnosis not present

## 2020-07-24 DIAGNOSIS — I110 Hypertensive heart disease with heart failure: Secondary | ICD-10-CM | POA: Diagnosis not present

## 2020-07-24 DIAGNOSIS — I4821 Permanent atrial fibrillation: Secondary | ICD-10-CM | POA: Insufficient documentation

## 2020-07-24 DIAGNOSIS — Z86711 Personal history of pulmonary embolism: Secondary | ICD-10-CM | POA: Insufficient documentation

## 2020-07-24 DIAGNOSIS — Z87820 Personal history of traumatic brain injury: Secondary | ICD-10-CM | POA: Diagnosis not present

## 2020-07-24 DIAGNOSIS — I252 Old myocardial infarction: Secondary | ICD-10-CM | POA: Diagnosis not present

## 2020-07-24 DIAGNOSIS — I255 Ischemic cardiomyopathy: Secondary | ICD-10-CM | POA: Diagnosis not present

## 2020-07-24 DIAGNOSIS — Z880 Allergy status to penicillin: Secondary | ICD-10-CM | POA: Diagnosis not present

## 2020-07-24 DIAGNOSIS — I5022 Chronic systolic (congestive) heart failure: Secondary | ICD-10-CM | POA: Diagnosis not present

## 2020-07-24 DIAGNOSIS — I482 Chronic atrial fibrillation, unspecified: Secondary | ICD-10-CM | POA: Diagnosis not present

## 2020-07-24 DIAGNOSIS — E119 Type 2 diabetes mellitus without complications: Secondary | ICD-10-CM | POA: Insufficient documentation

## 2020-07-24 DIAGNOSIS — G4733 Obstructive sleep apnea (adult) (pediatric): Secondary | ICD-10-CM | POA: Insufficient documentation

## 2020-07-24 DIAGNOSIS — Z95810 Presence of automatic (implantable) cardiac defibrillator: Secondary | ICD-10-CM | POA: Insufficient documentation

## 2020-07-24 DIAGNOSIS — Z882 Allergy status to sulfonamides status: Secondary | ICD-10-CM | POA: Diagnosis not present

## 2020-07-24 DIAGNOSIS — Z87891 Personal history of nicotine dependence: Secondary | ICD-10-CM | POA: Diagnosis not present

## 2020-07-24 DIAGNOSIS — Z7901 Long term (current) use of anticoagulants: Secondary | ICD-10-CM | POA: Diagnosis not present

## 2020-07-24 DIAGNOSIS — Z8249 Family history of ischemic heart disease and other diseases of the circulatory system: Secondary | ICD-10-CM | POA: Diagnosis not present

## 2020-07-24 DIAGNOSIS — Z79899 Other long term (current) drug therapy: Secondary | ICD-10-CM | POA: Insufficient documentation

## 2020-07-24 DIAGNOSIS — I2510 Atherosclerotic heart disease of native coronary artery without angina pectoris: Secondary | ICD-10-CM | POA: Diagnosis not present

## 2020-07-24 DIAGNOSIS — E785 Hyperlipidemia, unspecified: Secondary | ICD-10-CM | POA: Insufficient documentation

## 2020-07-24 NOTE — Progress Notes (Signed)
[facility] Note    Date:  07/24/2020   ID:  Carl Wood, DOB 08/22/1941, MRN [id number]  Location: Home  Provider location: [address], [place] Type of Visit: Established patient PCP:  Marin Olp, MD  Cardiologist:Dr Turner  Primary HF: Dr Haroldine Laws  EP: Dr Caryl Comes    History of Present Illness:  Mr Sheriff is a 79 year old with a history of CAD s/p MI 1983, ICM with chronic systolic heart failure s/p Medtronic single chamber ICD (EF 20% on last echo 8/19), CVA 2014, DM , VT, PE, HTN, permanent A fib on Xarelto, SDH 2019,  OSA, and former smoker.   Had CRT-D upgrade in April 2018.   In 12/18 saw Dr. Tomi Likens for chronic HA. Found to have small SDHs that were felt to be chronic after a previous fall when trimming vines. Xarelto stopped. F/u C 2/19 SDHs resolved. Xarelto restarted in 2/19  Today he returns for HF follow up.Overall feeling fine. Remains SOB with inclines and steps. Denies PND/Orthopnea. Has had nose bleed out of left nare. Saw PCP and he recommended referral to ENT. Continues to use CPAP.  Appetite ok. No fever or chills. Weight at home 204-205  pounds. Taking all medications.  12/2019 Echo  EF 20-25%  Past Medical History:  Diagnosis Date   CAD (coronary artery disease)    CHF (congestive heart failure) (HCC)    Chronic atrial fibrillation (HCC)    Colon polyps    Diabetes mellitus    Diverticulosis of colon    DIVERTICULOSIS, COLON 10/16/2006   Qualifier: Diagnosis of  By: Leanne Chang MD, Bruce     Excessive daytime sleepiness 02/19/2016   Hyperlipidemia    Hypertension    MI (mitral incompetence)    Nephrolithiasis    NEPHROLITHIASIS 06/26/2008   Qualifier: Diagnosis of  By: Sherlynn Stalls, CMA, Cindy     OSA (obstructive sleep apnea) 02/19/2016   Moderate to severe OSA with an AHI of 25/hr and on CPAP at 8cm H2O   PE (pulmonary embolism)    V-tach [facility])    Past Surgical History:  Procedure Laterality  Date   BACK SURGERY     BASAL CELL CARCINOMA EXCISION     nose   BIV UPGRADE N/A 03/28/2017   Procedure: BiV Upgrade;  Surgeon: Deboraha Sprang, MD;  Location: [facility];  Service: Cardiovascular;  Laterality: N/A;   CARDIAC CATHETERIZATION N/A 01/20/2016   Procedure: Right/Left Heart Cath and Coronary Angiography;  Surgeon: Jolaine Artist, MD;  Location: [facility];  Service: Cardiovascular;  Laterality: N/A;   CARDIAC DEFIBRILLATOR PLACEMENT     medtronic virtuoso   CHOLECYSTECTOMY     COLONOSCOPY  12/11/2012   Procedure: COLONOSCOPY;  Surgeon: Inda Castle, MD;  Location: [facility];  Service: Endoscopy;  Laterality: N/A;   KNEE ARTHROPLASTY       Current Outpatient Medications  Medication Sig Dispense Refill   ACCU-CHEK AVIVA PLUS test strip USE  STRIP TO CHECK GLUCOSE ONCE DAILY 100 each 4   Ascorbic Acid (VITAMIN C PO) Take 1 tablet by mouth.     atorvastatin (LIPITOR) 40 MG tablet Take 1 tablet (40 mg total) by mouth daily. 90 tablet 3   budesonide-formoterol (SYMBICORT) 80-4.5 MCG/ACT inhaler Inhale 2 puffs into the lungs 2 (two) times daily. 1 Inhaler 2   carvedilol (COREG) 12.5 MG tablet Take 1 tablet (12.5 mg total) by mouth 2 (two) times  daily with a meal. 180 tablet 3   digoxin (LANOXIN) 0.25 MG tablet Take 1/2 (one-half) tablet by mouth once daily. 45 tablet 2   furosemide (LASIX) 20 MG tablet TAKE 1 TABLET BY MOUTH ONCE A WEEK 12 tablet 4   glimepiride (AMARYL) 4 MG tablet TAKE 2 TABLETS BY MOUTH ONCE DAILY WITH BREAKFAST 180 tablet 3   JARDIANCE 10 MG TABS tablet Take 1 tablet by mouth once daily 30 tablet 0   metFORMIN (GLUCOPHAGE-XR) 500 MG 24 hr tablet Take 2 tablets (1,000 mg total) by mouth in the morning and at bedtime. 120 tablet 11   Multiple Vitamins-Minerals (ZINC PO) Take 1 tablet by mouth.     sacubitril-valsartan (ENTRESTO) 49-51 MG Take 1 tablet by mouth 2 (two) times daily. 60 tablet 0   XARELTO 20 MG TABS tablet  Take 1 tablet by mouth once daily 90 tablet 3   No current facility-administered medications for this encounter.    Allergies:   Penicillins and Sulfamethoxazole   Social History:  The patient  reports that he quit smoking about 7 years ago. His smoking use included cigarettes. He has a 50.00 pack-year smoking history. He has never used smokeless tobacco. He reports that he does not drink alcohol and does not use drugs.   Family History:  The patient's family history includes Bladder Cancer in his mother; Colon cancer in his father; Diabetes in his father; Heart attack (age of onset: 65) in his child; Heart attack (age of onset: 39) in his child; Heart disease in his father and mother.   ROS:  Please see the history of present illness.   All other systems are personally reviewed and negative.   Vitals:   07/24/20 1309  BP: (!) 100/58  Pulse: 83  SpO2: 94%    Exam:  General:  Well appearing. No resp difficulty HEENT: normal Neck: supple. no JVD. Carotids 2+ bilat; no bruits. No lymphadenopathy or thryomegaly appreciated. Cor: PMI nondisplaced. Irregular rate & rhythm. No rubs, gallops or murmurs. Lungs: clear except RLL  Abdomen: soft, nontender, nondistended. No hepatosplenomegaly. No bruits or masses. Good bowel sounds. Extremities: no cyanosis, clubbing, rash, edema Neuro: alert & orientedx3, cranial nerves grossly intact. moves all 4 extremities w/o difficulty. Affect pleasant  EKG: A fib 75 bpm QRS 158 ms   Recent Labs: 09/25/2019: TSH 2.38 12/12/2019: B Natriuretic Peptide 284.4 07/03/2020: ALT 15; BUN 22; Creat 0.88; Hemoglobin 16.1; Platelets 185; Potassium 5.1; Sodium 140  Personally reviewed   Wt Readings from Last 3 Encounters:  07/24/20 93 kg (205 lb)  07/03/20 (!) 92.5 kg (204 lb)  04/21/20 93.9 kg (207 lb)    ASSESSMENT AND PLAN:  1. Chronic Systolic HF: ICM, s/p Medtronic Bi-V ICD (upgrade 4/18). Echo 09/2016 EF 25-30%. ECHO 8/19 EF 20% - Echo today 12/11/18 EF  20-25% RV ok  - Doing well stable NYHA II - CPX testing on 5/18 pVO2 down 14.5->13.0 but he feels better and is able to do all ADLs and his yard work without too much difficulty.  -- NYHA III. Volume status stable.  - Continue current dose carvedilol to 12.5 bid + digoxin.  - Continue Entresto 49/51 mg BID. Was previosuly on full dose but had to cut back.  - Continue Jardiance. - No Spiro with history of hyperkalemia and soft BP.  - ICD interrogated- 100% BIv pacing. No VT No AF. Activity ~ 2 hours. - Reviewed BMET from July 2021 and this was stable.  2. OSA - Continue CPAP. Follows with Dr. Radford Pax  3. Chronic A fib - Rate controlled .  - . Xarelto restarted after resolution SDH.   4. CAD: - Has known distal LAD lesion 95% and mid LAD 40%. No benefit to revascularization given wall motion abnormality.  -No chest pain.   -  Continue statin. Off ASA with Xarelto  Follow up in 6 months with Dr Haroldine Laws.      Signed, Darrick Grinder, NP  1:42 PM   [facility] [address] Heart and [place] [address] [phone] (office) [phone] (fax)  Patient seen and examined with the above-signed Advanced Practice Provider and/or Housestaff. I personally reviewed laboratory data, imaging studies and relevant notes. I independently examined the patient and formulated the important aspects of the plan. I have edited the note to reflect any of my changes or salient points. I have personally discussed the plan with the patient and/or family.  Overall stable NYHA III. Volume status looks good.   General:  Well appearing. No resp difficulty HEENT: normal Neck: supple. no JVD. Carotids 2+ bilat; no bruits. No lymphadenopathy or thryomegaly appreciated. Cor: PMI nondisplaced. irregular rate & rhythm. No rubs, gallops or murmurs. Lungs: clear Abdomen: soft, nontender, nondistended. No hepatosplenomegaly. No bruits or masses. Good bowel  sounds. Extremities: no cyanosis, clubbing, rash, edema Neuro: alert & orientedx3, cranial nerves grossly intact. moves all 4 extremities w/o difficulty. Affect pleasant  CPX test reviewed with him and does qualify for advanced therapies but he is doing quite well and CPX also shows a lung limitation so I do not think VAD is a good option for him at this point. We will continue to follow closely. ICD interrogated personally in clinic and looks fine. (No VT, fluid ok, 100% BiV pacing)  Glori Bickers, MD  11:08 PM

## 2020-07-24 NOTE — Patient Instructions (Signed)
Your physician has requested that you have an echocardiogram. Echocardiography is a painless test that uses sound waves to create images of your heart. It provides your doctor with information about the size and shape of your heart and how well your heart's chambers and valves are working. This procedure takes approximately one hour. There are no restrictions for this procedure.   Your physician has requested that you have a carotid duplex. This test is an ultrasound of the carotid arteries in your neck. It looks at blood flow through these arteries that supply the brain with blood. Allow one hour for this exam. There are no restrictions or special instructions.  Call our office in December-January to schedule your follow up appointment, Echocardiogram, and Carotid Duplex.   If you have any questions or concerns before your next appointment please send [place] a message through [place] or call our office at [phone].    TO LEAVE A MESSAGE FOR THE NURSE SELECT OPTION 2, PLEASE LEAVE A MESSAGE INCLUDING: . YOUR NAME . DATE OF BIRTH . CALL BACK NUMBER . REASON FOR CALL**this is important as we prioritize the call backs  [place] AS LONG AS YOU CALL BEFORE 4:00 PM   At the [facility], you and your health needs are our priority. As part of our continuing mission to provide you with exceptional heart care, we have created designated Provider Care Teams. These Care Teams include your primary Cardiologist (physician) and Advanced Practice Providers (APPs- Physician Assistants and Nurse Practitioners) who all work together to provide you with the care you need, when you need it.   You may see any of the following providers on your designated Care Team at your next follow up: Marland Kitchen Dr Glori Bickers . Dr Loralie Champagne . Darrick Grinder, NP . Lyda Jester, PA . Audry Riles, PharmD   Please be sure to bring in all your medications bottles to every  appointment.

## 2020-07-24 NOTE — Addendum Note (Signed)
Encounter addended by: Jolaine Artist, MD on: 07/24/2020 11:10 PM  Actions taken: Level of Service modified, Visit diagnoses modified, Charge Capture section accepted

## 2020-07-24 NOTE — Progress Notes (Signed)
Medication Samples have been provided to the patient.  Drug name: Jardiance       Strength: 10mg         Qty: 5 bottle of 7tablets  LOT: [id number] (4) [id number] (1)  Exp.Date: 10/22 (4) 06/23 (1)  Dosing instructions: Take 1 tablet PO Daily  The patient has been instructed regarding the correct time, dose, and frequency of taking this medication, including desired effects and most common side effects.   Medication Samples have been provided to the patient.  Drug name: Alen Blew       Strength: 20mg         Qty: 5 Bottle of 7 tablets  LOT: [id number]  Exp.Date: 11-21  Dosing instructions: Take 1 tablet PO Daily  The patient has been instructed regarding the correct time, dose, and frequency of taking this medication, including desired effects and most common side effects.   Kadan Millstein L Donnika Kucher 1:24 PM 07/24/2020

## 2020-07-29 ENCOUNTER — Ambulatory Visit (INDEPENDENT_AMBULATORY_CARE_PROVIDER_SITE_OTHER): Payer: Medicare HMO | Admitting: *Deleted

## 2020-07-29 DIAGNOSIS — I472 Ventricular tachycardia, unspecified: Secondary | ICD-10-CM

## 2020-07-29 LAB — CUP PACEART REMOTE DEVICE CHECK
Battery Remaining Longevity: 24 mo
Battery Voltage: 2.94 V
Brady Statistic AP VP Percent: 0 %
Brady Statistic AP VS Percent: 0 %
Brady Statistic AS VP Percent: 0 %
Brady Statistic AS VS Percent: 0 %
Brady Statistic RA Percent Paced: 0 %
Brady Statistic RV Percent Paced: 98.47 %
Date Time Interrogation Session: 20210824043826
HighPow Impedance: 78 Ohm
Implantable Lead Implant Date: 20010723
Implantable Lead Implant Date: 20180423
Implantable Lead Location: 753858
Implantable Lead Location: 753860
Implantable Lead Model: 6943
Implantable Pulse Generator Implant Date: 20180423
Lead Channel Impedance Value: 1007 Ohm
Lead Channel Impedance Value: 266 Ohm
Lead Channel Impedance Value: 266 Ohm
Lead Channel Impedance Value: 266 Ohm
Lead Channel Impedance Value: 279.525
Lead Channel Impedance Value: 279.525
Lead Channel Impedance Value: 285 Ohm
Lead Channel Impedance Value: 399 Ohm
Lead Channel Impedance Value: 4047 Ohm
Lead Channel Impedance Value: 532 Ohm
Lead Channel Impedance Value: 532 Ohm
Lead Channel Impedance Value: 532 Ohm
Lead Channel Impedance Value: 589 Ohm
Lead Channel Impedance Value: 931 Ohm
Lead Channel Impedance Value: 950 Ohm
Lead Channel Impedance Value: 950 Ohm
Lead Channel Impedance Value: 950 Ohm
Lead Channel Impedance Value: 988 Ohm
Lead Channel Pacing Threshold Amplitude: 1.375 V
Lead Channel Pacing Threshold Amplitude: 2 V
Lead Channel Pacing Threshold Pulse Width: 0.4 ms
Lead Channel Pacing Threshold Pulse Width: 1 ms
Lead Channel Sensing Intrinsic Amplitude: 11.375 mV
Lead Channel Sensing Intrinsic Amplitude: 11.375 mV
Lead Channel Setting Pacing Amplitude: 2.5 V
Lead Channel Setting Pacing Amplitude: 2.75 V
Lead Channel Setting Pacing Pulse Width: 0.4 ms
Lead Channel Setting Pacing Pulse Width: 1 ms
Lead Channel Setting Sensing Sensitivity: 0.3 mV

## 2020-08-04 NOTE — Progress Notes (Signed)
Remote ICD transmission.   

## 2020-08-27 DIAGNOSIS — G4733 Obstructive sleep apnea (adult) (pediatric): Secondary | ICD-10-CM | POA: Diagnosis not present

## 2020-09-15 ENCOUNTER — Inpatient Hospital Stay: Admission: RE | Admit: 2020-09-15 | Payer: Medicare HMO | Source: Ambulatory Visit

## 2020-09-16 ENCOUNTER — Encounter: Payer: Self-pay | Admitting: Family Medicine

## 2020-09-16 ENCOUNTER — Telehealth: Payer: Medicare HMO | Admitting: Family Medicine

## 2020-09-16 DIAGNOSIS — J209 Acute bronchitis, unspecified: Secondary | ICD-10-CM | POA: Diagnosis not present

## 2020-09-16 DIAGNOSIS — J0100 Acute maxillary sinusitis, unspecified: Secondary | ICD-10-CM | POA: Diagnosis not present

## 2020-09-22 DIAGNOSIS — J209 Acute bronchitis, unspecified: Secondary | ICD-10-CM | POA: Diagnosis not present

## 2020-09-22 DIAGNOSIS — R0600 Dyspnea, unspecified: Secondary | ICD-10-CM | POA: Diagnosis not present

## 2020-09-30 ENCOUNTER — Telehealth (HOSPITAL_COMMUNITY): Payer: Self-pay

## 2020-09-30 NOTE — Telephone Encounter (Signed)
Patient called triage requesting samples of Jardiance and Xarelto. Tried calling patient back to let him I left samples up front for him. We were out of Xarelto 20mg  so I gave him 10mg  just take 2 daily.  Medication Samples have been provided to the patient.  Drug name: Xarelto       Strength: 10mg         Qty: 4  LOT: [id number]  Exp.Date: 03/23  Dosing instructions: TAKE 2 TABLETS(20MG ) BY MOUTH DAILY  The patient has been instructed regarding the correct time, dose, and frequency of taking this medication, including desired effects and most common side effects.   Medication Samples have been provided to the patient.  Drug name: Jardiance       Strength: 10mg         Qty: 6  LOT: [id number]  Exp.Date: 08/23  Dosing instructions: TAKE 1 TABLET BY MOUTH DAILY  The patient has been instructed regarding the correct time, dose, and frequency of taking this medication, including desired effects and most common side effects.   Alara Daniel R Shauntell Iglesia 11:88 AM [id number]   Clotee Schlicker R Thurston Brendlinger 81:59 AM 09/30/2020

## 2020-10-09 ENCOUNTER — Ambulatory Visit (INDEPENDENT_AMBULATORY_CARE_PROVIDER_SITE_OTHER)
Admission: RE | Admit: 2020-10-09 | Discharge: 2020-10-09 | Disposition: A | Discharge status: Home / Self Care | Payer: Medicare HMO | Source: Ambulatory Visit | Attending: Acute Care | Admitting: Acute Care

## 2020-10-09 ENCOUNTER — Other Ambulatory Visit: Payer: Self-pay

## 2020-10-09 DIAGNOSIS — Z87891 Personal history of nicotine dependence: Secondary | ICD-10-CM | POA: Diagnosis not present

## 2020-10-09 DIAGNOSIS — Z122 Encounter for screening for malignant neoplasm of respiratory organs: Secondary | ICD-10-CM

## 2020-10-13 ENCOUNTER — Other Ambulatory Visit: Payer: Self-pay | Admitting: Family Medicine

## 2020-10-16 ENCOUNTER — Telehealth: Payer: Self-pay | Admitting: Family Medicine

## 2020-10-16 NOTE — Progress Notes (Signed)
I have called the patient with these results. He states he has been recovering from pneumonia. I told him I would discuss the scan results with Dr. Valeta Harms and determine best plan of care. He understands that we will call him with next steps once I have spoken with Dr. Valeta Harms.

## 2020-10-16 NOTE — Progress Notes (Signed)
  Chronic Care Management   Outreach Note  10/16/2020 Name: Carl Wood MRN: [id number] DOB: Feb 08, 1941  Referred by: Marin Olp, MD Reason for referral : No chief complaint on file.   An unsuccessful telephone outreach was attempted today. The patient was referred to the pharmacist for assistance with care management and care coordination.   Follow Up Plan:   Lauretta Grill Upstream Scheduler

## 2020-10-20 ENCOUNTER — Other Ambulatory Visit: Payer: Self-pay | Admitting: *Deleted

## 2020-10-20 DIAGNOSIS — R911 Solitary pulmonary nodule: Secondary | ICD-10-CM

## 2020-10-20 NOTE — Progress Notes (Signed)
I have called the patient. A family member answered the phone. The patient is not available. His family member is going  to have him  return the call today. I provided the contact number . We will await the return of the call.

## 2020-10-20 NOTE — Progress Notes (Signed)
I have spoken with the patient. He states he is still recovering from pneumonia, but is starting to feel better. HIs CT scan was read as a Lung RADS 4 B indicates suspicious findings for which additional diagnostic testing and or tissue sampling is recommended.  I reviewed his scan with Dr. Valeta Harms, who feels the findings are more reflective of infection and inflammation. Plan is to repeat CXR in the office 12/15 to ensure resolution of pneumonia prior to repeat Low Dose Ct in 6-8 weeks. He is aware of the appointment and the time and location.  Denise, please place order for repeat CT Chest without contrast in 8 weeks. Please specify in instructions to give a Lung Rads reading if possible, for follow up recommendations, as this is a follow up of a LR 4B. Thanks so much

## 2020-10-27 ENCOUNTER — Telehealth: Payer: Self-pay | Admitting: Family Medicine

## 2020-10-27 NOTE — Telephone Encounter (Signed)
Left message for patient to call back and schedule Medicare Annual Wellness Visit (AWV) either virtually OR in office.   Last AWV 09/25/19; please schedule at anytime with [facility]Nurse Health Advisor at [facility].  This should be a 45 minute visit.

## 2020-10-28 ENCOUNTER — Ambulatory Visit (INDEPENDENT_AMBULATORY_CARE_PROVIDER_SITE_OTHER): Payer: Medicare HMO

## 2020-10-28 DIAGNOSIS — I472 Ventricular tachycardia: Secondary | ICD-10-CM

## 2020-10-28 DIAGNOSIS — I4729 Other ventricular tachycardia: Secondary | ICD-10-CM

## 2020-10-28 LAB — CUP PACEART REMOTE DEVICE CHECK
Battery Remaining Longevity: 20 mo
Battery Voltage: 2.93 V
Brady Statistic AP VP Percent: 0 %
Brady Statistic AP VS Percent: 0 %
Brady Statistic AS VP Percent: 0 %
Brady Statistic AS VS Percent: 0 %
Brady Statistic RA Percent Paced: 0 %
Brady Statistic RV Percent Paced: 97.12 %
Date Time Interrogation Session: 20211123001703
HighPow Impedance: 75 Ohm
Implantable Lead Implant Date: 20010723
Implantable Lead Implant Date: 20180423
Implantable Lead Location: 753858
Implantable Lead Location: 753860
Implantable Lead Model: 6943
Implantable Pulse Generator Implant Date: 20180423
Lead Channel Impedance Value: 256.5 Ohm
Lead Channel Impedance Value: 261.164
Lead Channel Impedance Value: 261.164
Lead Channel Impedance Value: 274.19 Ohm
Lead Channel Impedance Value: 274.19 Ohm
Lead Channel Impedance Value: 285 Ohm
Lead Channel Impedance Value: 4047 Ohm
Lead Channel Impedance Value: 418 Ohm
Lead Channel Impedance Value: 513 Ohm
Lead Channel Impedance Value: 513 Ohm
Lead Channel Impedance Value: 532 Ohm
Lead Channel Impedance Value: 589 Ohm
Lead Channel Impedance Value: 893 Ohm
Lead Channel Impedance Value: 931 Ohm
Lead Channel Impedance Value: 931 Ohm
Lead Channel Impedance Value: 931 Ohm
Lead Channel Impedance Value: 988 Ohm
Lead Channel Impedance Value: 988 Ohm
Lead Channel Pacing Threshold Amplitude: 1.375 V
Lead Channel Pacing Threshold Amplitude: 1.625 V
Lead Channel Pacing Threshold Pulse Width: 0.4 ms
Lead Channel Pacing Threshold Pulse Width: 1 ms
Lead Channel Sensing Intrinsic Amplitude: 10 mV
Lead Channel Sensing Intrinsic Amplitude: 10 mV
Lead Channel Setting Pacing Amplitude: 2.25 V
Lead Channel Setting Pacing Amplitude: 3 V
Lead Channel Setting Pacing Pulse Width: 0.4 ms
Lead Channel Setting Pacing Pulse Width: 1 ms
Lead Channel Setting Sensing Sensitivity: 0.3 mV

## 2020-11-04 NOTE — Progress Notes (Signed)
Remote ICD transmission.   

## 2020-11-05 NOTE — Progress Notes (Addendum)
Phone [phone] In person visit   Subjective:   Carl Wood is a 79 y.o. year old very pleasant male patient who presents for/with See problem oriented charting Chief Complaint  Patient presents with  . Atrial Fibrillation  . Hyperlipidemia  . Hypertension   This visit occurred during the SARS-CoV-2 public health emergency.  Safety protocols were in place, including screening questions prior to the visit, additional usage of staff PPE, and extensive cleaning of exam room while observing appropriate contact time as indicated for disinfecting solutions.   Past Medical History-  Patient Active Problem List   Diagnosis Date Noted  . COPD (chronic obstructive pulmonary disease) ([place]) 12/26/2018    Priority: High  . History of subdural hematoma 11/21/2017    Priority: High  . Chronic atrial fibrillation (HCC)     Priority: High  . Chronic systolic heart failure ([place]) 08/06/2015    Priority: High  . Former smoker 11/15/2014    Priority: High  . History of cardioembolic cerebrovascular accident (CVA) 12/19/2012    Priority: High  .  ventricular tachycardia-non sustained  02/17/2012    Priority: High  . Diabetes mellitus type II, controlled ([place]) 10/16/2006    Priority: High  . CAD (coronary artery disease) 10/16/2006    Priority: High  . Chronic pulmonary embolism ([place]) 10/16/2006    Priority: High  . Dyspnea on exertion 08/22/2017    Priority: Medium  . OSA (obstructive sleep apnea) 02/19/2016    Priority: Medium  . Carotid artery stenosis 03/17/2015    Priority: Medium  . Implantable cardioverter-defibrillator (ICD) in situ 02/17/2012    Priority: Medium  . History of skin cancer 07/05/2007    Priority: Medium  . Hyperlipidemia associated with type 2 diabetes mellitus ([place]) 10/16/2006    Priority: Medium  . Hypertension associated with diabetes ([place]) 10/16/2006    Priority: Medium  . Osteoarthritis of right knee 09/22/2018    Priority: Low  . Frontal headache  10/06/2017    Priority: Low  . Rhinitis 08/22/2017    Priority: Low  . LBBB (left bundle branch block) 08/06/2015    Priority: Low  . Actinic keratosis 11/15/2014    Priority: Low  . Benign neoplasm of colon 12/11/2012    Priority: Low    Medications- reviewed and updated Current Outpatient Medications  Medication Sig Dispense Refill  . ACCU-CHEK AVIVA PLUS test strip USE  STRIP TO CHECK GLUCOSE ONCE DAILY 100 each 4  . Ascorbic Acid (VITAMIN C PO) Take 1 tablet by mouth.    Marland Kitchen atorvastatin (LIPITOR) 40 MG tablet Take 1 tablet (40 mg total) by mouth daily. 90 tablet 3  . budesonide-formoterol (SYMBICORT) 80-4.5 MCG/ACT inhaler Inhale 2 puffs into the lungs 2 (two) times daily. 1 Inhaler 2  . carvedilol (COREG) 12.5 MG tablet Take 1 tablet (12.5 mg total) by mouth 2 (two) times daily with a meal. 180 tablet 3  . digoxin (LANOXIN) 0.25 MG tablet Take 1/2 (one-half) tablet by mouth once daily. 45 tablet 2  . furosemide (LASIX) 20 MG tablet TAKE 1 TABLET BY MOUTH ONCE A WEEK 12 tablet 4  . glimepiride (AMARYL) 4 MG tablet TAKE 2 TABLETS BY MOUTH ONCE DAILY WITH BREAKFAST 180 tablet 0  . JARDIANCE 10 MG TABS tablet Take 1 tablet by mouth once daily 30 tablet 0  . metFORMIN (GLUCOPHAGE-XR) 500 MG 24 hr tablet Take 2 tablets (1,000 mg total) by mouth in the morning and at bedtime. 120 tablet 11  . Multiple Vitamins-Minerals (  ZINC PO) Take 1 tablet by mouth.    . sacubitril-valsartan (ENTRESTO) 49-51 MG Take 1 tablet by mouth 2 (two) times daily. 60 tablet 0  . XARELTO 20 MG TABS tablet Take 1 tablet by mouth once daily 90 tablet 3   No current facility-administered medications for this visit.     Objective:  BP 128/74   Pulse 72   Temp 97.7 F (36.5 C) (Temporal)   Resp 18   Ht 6' (1.829 m)   Wt 200 lb 12.8 oz (91.1 kg)   SpO2 98%   BMI 27.23 kg/m  Gen: NAD, resting comfortably CV: Irregularly irregular no murmurs rubs or gallops Lungs: CTAB no crackles, wheeze, rhonchi Abdomen:  soft/nontender/nondistended/normal bowel sounds.  Ext: no edema Skin: warm, dry    Assessment and Plan   # healed up from pneumonia seen in 66- he is going to reach out if similar situation by [employer] and we will do our best to work him in if possible  #Chronic systolic heart failure-follows with Dr. Haroldine Laws #ICD in place with history of ventricular tachycardia S: Compliant with carvedilol 12.5 mg twice a day, digoxin, Lasix 20 mg-once a week- not always taking- only if weight goes up, Entresto.  Also on Jardiance for diabetes which is favorable for heart failure patients -ICD monitored by cardiology A/P: CHF appears stable- thankfully not even always having to use lasix. Continue current meds. ICD stable.     # Atrial fibrillation chronic/ history of cardioemolic CVA/history subdural hematoma S: Compliant with carvedilol 12.5 mg twice a day for rate control and Xarelto 20 mg for anticoagulation. -Does have history of subdural hematoma-Xarelto was held short-term but was restarted once resolved- not on aspirin due to subdural -History of cardioembolic strokes we prefer to have him on Xarelto as long as no falls A/P: Appropriately anticoagulated and rate controlled-continue current medication  #CAD/hyperlipidemia/carotid artery stenosis S: History of MI in 1983.  Denies history of stents or bypass-streptokinase was used in the past per patient. -Due to bleeding risk is on Xarelto alone and not on aspirin -Patient compliant with atorvastatin 40 mg daily.  LDL goal under 70 but has preferred not to add Zetia or change to rosuvastatin  A/P: CAD stable (no cp at all or SOB above baseline ). HLD has been slightly high- update LDL.  -Carotid artery stenosis in 2015.  Only 1 to 39% stenosis-patient declines repeat testing up through 2021 - had been ordered in august by cardiology Dr. Haroldine Laws- he is going to check on this next visit.   #COPD S: Compliant with Symbicort 8-4.5 mcg/act 2  puffs twice daily. Has albuterol on hand bu tnot having to use  A/P: Stable. Continue current medications.   # Diabetes S: Compliant with metformin 1000mg  BID, jardiance 10mg  (q2 hour urination so wants to avoid increase), amaryl 8 mg CBGs- 120s- 150s Exercise and diet- exercise limited by lungs/mobility- legs/back. Weight down 4 lbs from July- trying to eat healthy  A/P: hopefully a1c stable- update today and if below 8 likely continue curent meds  #Hypertension S: Compliant with carvedilol, Entresto.  Blood pressure traditionally runs very low but these medications have been required due to CHF  A/P: Stable. Continue current medications.    #OSA-compliant with CPAP  #Former smoker-enrolled in lung cancer screening program until no longer qualifies-potentially at 81- has to have repeat CXR as had slight abnormality on last but may have been leftover from pneumonia  Recommended follow up: Return in about 4  months (around 03/07/2021) for physical or sooner if needed. Future Appointments  Date Time Provider [place]  11/19/2020  2:00 PM Magdalen Spatz, NP [facility] None  12/24/2020  2:20 PM Sueanne Margarita, MD [facility]  01/26/2021  2:00 PM Deboraha Sprang, MD [facility]  01/27/2021  7:20 AM [facility] DEVICE REMOTES [facility]  04/28/2021  7:20 AM [facility] DEVICE REMOTES [facility]  07/28/2021  7:20 AM [facility] DEVICE REMOTES [facility]  10/27/2021  7:20 AM [facility] DEVICE REMOTES [facility]  01/26/2022  7:20 AM [facility] DEVICE REMOTES [facility]  04/27/2022  7:20 AM [facility] DEVICE REMOTES [facility]   Lab/Order associations:   ICD-10-CM   1. Hypertension associated with diabetes ([place])  E11.59    I15.2   2. Coronary artery disease involving native coronary artery of native heart without angina pectoris  I25.10   3. Chronic obstructive pulmonary  disease, unspecified COPD type ([place])  J44.9   4. Controlled type 2 diabetes mellitus without complication, without long-term current use of insulin (HCC)  O03.2 COMPLETE METABOLIC PANEL WITH GFR    Hemoglobin A1c    CBC With Differential/Platelet    LDL cholesterol, direct  5. Hyperlipidemia associated with type 2 diabetes mellitus ([place])  E11.69    E78.5    Return precautions advised.  [name], MD

## 2020-11-05 NOTE — Patient Instructions (Addendum)
Can do wellness visit with our nurse specialist Otila Kluver by phone or video - due as of 09/25/2019  Please stop by lab before you go If you have mychart- we will send your results within 3 business days of [place] receiving them.  If you do not have mychart- we will call you about results within 5 business days of [place] receiving them.  *please note we are currently using [facility] which has a longer processing time than [place] typically so labs may not come back as quickly as in the past *please also note that you will see labs on mychart as soon as they post. I will later go in and write notes on them- will say "notes from Dr. Yong Channel"  Glad you are doing well and healed up from pneumonia- please let me know by mychart if run into similar situation in future and we will do our best to work you in if we can

## 2020-11-06 ENCOUNTER — Other Ambulatory Visit: Payer: Self-pay

## 2020-11-06 ENCOUNTER — Encounter: Payer: Self-pay | Admitting: Family Medicine

## 2020-11-06 ENCOUNTER — Ambulatory Visit (INDEPENDENT_AMBULATORY_CARE_PROVIDER_SITE_OTHER): Payer: Medicare HMO | Admitting: Family Medicine

## 2020-11-06 VITALS — BP 128/74 | HR 72 | Temp 97.7°F | Resp 18 | Ht 72.0 in | Wt 200.8 lb

## 2020-11-06 DIAGNOSIS — E1159 Type 2 diabetes mellitus with other circulatory complications: Secondary | ICD-10-CM | POA: Diagnosis not present

## 2020-11-06 DIAGNOSIS — E1169 Type 2 diabetes mellitus with other specified complication: Secondary | ICD-10-CM | POA: Diagnosis not present

## 2020-11-06 DIAGNOSIS — I152 Hypertension secondary to endocrine disorders: Secondary | ICD-10-CM

## 2020-11-06 DIAGNOSIS — E119 Type 2 diabetes mellitus without complications: Secondary | ICD-10-CM | POA: Diagnosis not present

## 2020-11-06 DIAGNOSIS — I2510 Atherosclerotic heart disease of native coronary artery without angina pectoris: Secondary | ICD-10-CM

## 2020-11-06 DIAGNOSIS — E785 Hyperlipidemia, unspecified: Secondary | ICD-10-CM | POA: Diagnosis not present

## 2020-11-06 DIAGNOSIS — J449 Chronic obstructive pulmonary disease, unspecified: Secondary | ICD-10-CM | POA: Diagnosis not present

## 2020-11-08 LAB — COMPLETE METABOLIC PANEL WITH GFR
AG Ratio: 1.7 (calc) (ref 1.0–2.5)
ALT: 19 U/L (ref 9–46)
AST: 16 U/L (ref 10–35)
Albumin: 4.2 g/dL (ref 3.6–5.1)
Alkaline phosphatase (APISO): 77 U/L (ref 35–144)
BUN: 16 mg/dL (ref 7–25)
CO2: 23 mmol/L (ref 20–32)
Calcium: 10 mg/dL (ref 8.6–10.3)
Chloride: 102 mmol/L (ref 98–110)
Creat: 0.82 mg/dL (ref 0.70–1.18)
GFR, Est African American: 99 mL/min/{1.73_m2} (ref >=60)
GFR, Est Non African American: 85 mL/min/{1.73_m2} (ref >=60)
Globulin: 2.5 g/dL (calc) (ref 1.9–3.7)
Glucose, Bld: 198 mg/dL — ABNORMAL HIGH (ref 65–99)
Potassium: 4.3 mmol/L (ref 3.5–5.3)
Sodium: 142 mmol/L (ref 135–146)
Total Bilirubin: 0.6 mg/dL (ref 0.2–1.2)
Total Protein: 6.7 g/dL (ref 6.1–8.1)

## 2020-11-08 LAB — CBC WITH DIFFERENTIAL/PLATELET
Absolute Monocytes: 653 cells/uL (ref 200–950)
Basophils Absolute: 40 cells/uL (ref 0–200)
Basophils Relative: 0.6 %
Eosinophils Absolute: 198 cells/uL (ref 15–500)
Eosinophils Relative: 3 %
HCT: 45.9 % (ref 38.5–50.0)
Hemoglobin: 15.1 g/dL (ref 13.2–17.1)
Lymphs Abs: 1096 cells/uL (ref 850–3900)
MCH: 31.9 pg (ref 27.0–33.0)
MCHC: 32.9 g/dL (ref 32.0–36.0)
MCV: 96.8 fL (ref 80.0–100.0)
MPV: 10.7 fL (ref 7.5–12.5)
Monocytes Relative: 9.9 %
Neutro Abs: 4613 cells/uL (ref 1500–7800)
Neutrophils Relative %: 69.9 %
Platelets: 210 10*3/uL (ref 140–400)
RBC: 4.74 10*6/uL (ref 4.20–5.80)
RDW: 12.7 % (ref 11.0–15.0)
Total Lymphocyte: 16.6 %
WBC: 6.6 10*3/uL (ref 3.8–10.8)

## 2020-11-08 LAB — HEMOGLOBIN A1C
Hgb A1c MFr Bld: 7.4 % of total Hgb — ABNORMAL HIGH (ref <5.7)
Mean Plasma Glucose: 166 (calc)
eAG (mmol/L): 9.2 (calc)

## 2020-11-08 LAB — LDL CHOLESTEROL, DIRECT: Direct LDL: 77 mg/dL (ref <100)

## 2020-11-14 ENCOUNTER — Telehealth (HOSPITAL_COMMUNITY): Payer: Self-pay | Admitting: Pharmacy Technician

## 2020-11-14 NOTE — Telephone Encounter (Signed)
Sent in [employer] re-enrollment application via fax.  Will follow up.

## 2020-11-19 ENCOUNTER — Other Ambulatory Visit: Payer: Self-pay

## 2020-11-19 ENCOUNTER — Ambulatory Visit (INDEPENDENT_AMBULATORY_CARE_PROVIDER_SITE_OTHER): Payer: Medicare HMO

## 2020-11-19 ENCOUNTER — Encounter: Payer: Self-pay | Admitting: Acute Care

## 2020-11-19 ENCOUNTER — Ambulatory Visit: Payer: Medicare HMO | Admitting: Acute Care

## 2020-11-19 VITALS — BP 110/72 | HR 90 | Temp 98.0°F | Ht 72.0 in | Wt 204.8 lb

## 2020-11-19 DIAGNOSIS — J449 Chronic obstructive pulmonary disease, unspecified: Secondary | ICD-10-CM

## 2020-11-19 DIAGNOSIS — J181 Lobar pneumonia, unspecified organism: Secondary | ICD-10-CM

## 2020-11-19 DIAGNOSIS — Z87891 Personal history of nicotine dependence: Secondary | ICD-10-CM

## 2020-11-19 NOTE — Progress Notes (Signed)
History of Present Illness Carl Wood is a 79 y.o. male former smoker ( Quit 2014 with a 50 pack year smoking history) followed through the [facility].   Synopsis Pt. Followed by the Lung Cancer Screening Program. LDCT done 10/11/2020 was read as a LR 4 B.  He was recovering from pneumonia at the time of the scan. The scan was  reviewed with Dr. Valeta Wood, who feels the findings are more reflective of infection and inflammation. Plan is to repeat CXR in the office 12/15 to ensure resolution of pneumonia prior to repeat Low Dose Ct in 6-8 weeks. As patient is 41b years old, this will be his last annual screening. This abnormal scan will be followed through resolution.   11/19/2020 Follow Up LR 4 B LDCT. Pt. Presents for follow up. He was treated by his PCP for pneumonia diagnosed  09/10/2020 with antibiotics. He  does not know what they were. He did not get better after the first round of antibiotics. He was treated again with a different antibiotic. Again, he is unsure which one. He states about 3 days after starting the second antibiotic he started to feel better. He states he was  compliant with his antibiotic treatment. He states he now feels he is back to his baseline. Feels good and has no issues with his breathing. He denies any cough, no secretion production. No fever.   Test Results: CXR 11/19/2020  Chronic pleural scarring in the right lateral lung base unchanged from prior studies. No acute infiltrate or effusion.  Nodular airspace density in the right lower lobe seen on CT chest of 10/09/2020 not well identified on the current study.  IMPRESSION: No acute abnormality no change from prior studies. See above comments.   LDCT 10/2020 Lung-RADS 4BS, suspicious. Additional imaging evaluation or consultation with Pulmonology or Thoracic Surgery recommended. The "S" modifier above refers to potentially clinically significant non lung cancer related findings.  Specifically, there is aortic atherosclerosis, in addition to left main and 3 vessel coronary artery disease. Assessment for potential risk factor modification, dietary therapy or pharmacologic therapy may be warranted, if clinically indicated. Mild diffuse bronchial wall thickening with mild centrilobular and paraseptal emphysema; imaging findings suggestive of underlying COPD. Reviewed by Dr. Valeta Wood  CBC Latest Ref Rng & Units 11/06/2020 07/03/2020 01/03/2020  WBC 3.8 - 10.8 Thousand/uL 6.6 6.2 8.0  Hemoglobin 13.2 - 17.1 g/dL 15.1 16.1 15.9  Hematocrit 38.5 - 50.0 % 45.9 48.1 47.9  Platelets 140 - 400 Thousand/uL 210 185 190.0    BMP Latest Ref Rng & Units 11/06/2020 07/03/2020 01/03/2020  Glucose 65 - 99 mg/dL 198(H) 148(H) 180(H)  BUN 7 - 25 mg/dL 16 22 21   Creatinine 0.70 - 1.18 mg/dL 0.82 0.88 0.95  BUN/Creat Ratio 6 - 22 (calc) NOT APPLICABLE NOT APPLICABLE -  Sodium 381 - 146 mmol/L 142 140 139  Potassium 3.5 - 5.3 mmol/L 4.3 5.1 4.6  Chloride 98 - 110 mmol/L 102 104 104  CO2 20 - 32 mmol/L 23 27 28   Calcium 8.6 - 10.3 mg/dL 10.0 9.6 9.9    BNP    Component Value Date/Time   BNP 284.4 (H) 12/12/2019 1534   BNP 97.2 05/21/2016 1627    ProBNP    Component Value Date/Time   PROBNP 170.0 (H) 08/20/2009 1540    PFT No results found for: FEV1PRE, FEV1POST, FVCPRE, FVCPOST, TLC, DLCOUNC, PREFEV1FVCRT, PSTFEV1FVCRT  DG Chest 2 View  Result Date: 11/19/2020 CLINICAL DATA:  Short  of breath.  COPD. EXAM: CHEST - 2 VIEW COMPARISON:  11/14/2018 chest x-ray.  CT chest 10/09/2020 FINDINGS: Heart size normal. Left ventricular calcification anteriorly is chronic and unchanged. AICD leads unchanged. Negative for heart failure. Chronic pleural scarring in the right lateral lung base unchanged from prior studies. No acute infiltrate or effusion. Nodular airspace density in the right lower lobe seen on CT chest of 10/09/2020 not well identified on the current study. IMPRESSION: No acute  abnormality no change from prior studies. See above comments. Electronically Signed   By: Carl Wood M.D.   On: 11/19/2020 14:08   CUP PACEART REMOTE DEVICE CHECK  Result Date: 10/28/2020 Scheduled remote reviewed. Normal device function.  Next remote 91 days- JBox, RN/CVRS    Past medical hx Past Medical History:  Diagnosis Date  . CAD (coronary artery disease)   . CHF (congestive heart failure) ([place])   . Chronic atrial fibrillation ([place])   . Colon polyps   . Diabetes mellitus   . Diverticulosis of colon   . DIVERTICULOSIS, COLON 10/16/2006   Qualifier: Diagnosis of  By: Carl Chang MD, Carl Wood    . Excessive daytime sleepiness 02/19/2016  . Hyperlipidemia   . Hypertension   . MI (mitral incompetence)   . Nephrolithiasis   . NEPHROLITHIASIS 06/26/2008   Qualifier: Diagnosis of  By: Sherlynn Stalls, CMA, [place]    . OSA (obstructive sleep apnea) 02/19/2016   Moderate to severe OSA with an AHI of 25/hr and on CPAP at 8cm H2O  . PE (pulmonary embolism)   . V-tach Carl Wood)      Social History   Tobacco Use  . Smoking status: Former Smoker    Packs/day: 1.00    Years: 50.00    Pack years: 50.00    Types: Cigarettes    Quit date: 12/15/2012    Years since quitting: 7.9  . Smokeless tobacco: Never Used  Vaping Use  . Vaping Use: Never used  Substance Use Topics  . Alcohol use: No    Alcohol/week: 0.0 standard drinks  . Drug use: No    Mr.Carl Wood reports that he quit smoking about 7 years ago. His smoking use included cigarettes. He has a 50.00 pack-year smoking history. He has never used smokeless tobacco. He reports that he does not drink alcohol and does not use drugs.  Tobacco Cessation: Former smoker with a 50 pack year smoking history , quit 2014  Past surgical hx, Family hx, Social hx all reviewed.  Current Outpatient Medications on File Prior to Visit  Medication Sig  . ACCU-CHEK AVIVA PLUS test strip USE  STRIP TO CHECK GLUCOSE ONCE DAILY  . Ascorbic Acid (VITAMIN C PO)  Take 1 tablet by mouth.  Marland Kitchen atorvastatin (LIPITOR) 40 MG tablet Take 1 tablet (40 mg total) by mouth daily.  . carvedilol (COREG) 12.5 MG tablet Take 1 tablet (12.5 mg total) by mouth 2 (two) times daily with a meal.  . digoxin (LANOXIN) 0.25 MG tablet Take 1/2 (one-half) tablet by mouth once daily.  Marland Kitchen FLUAD QUADRIVALENT 0.5 ML injection   . furosemide (LASIX) 20 MG tablet TAKE 1 TABLET BY MOUTH ONCE A WEEK  . glimepiride (AMARYL) 4 MG tablet TAKE 2 TABLETS BY MOUTH ONCE DAILY WITH BREAKFAST  . JARDIANCE 10 MG TABS tablet Take 1 tablet by mouth once daily  . metFORMIN (GLUCOPHAGE-XR) 500 MG 24 hr tablet Take 2 tablets (1,000 mg total) by mouth in the morning and at bedtime.  . Multiple Vitamins-Minerals (ZINC PO) Take  1 tablet by mouth.  . sacubitril-valsartan (ENTRESTO) 49-51 MG Take 1 tablet by mouth 2 (two) times daily.  Alveda Reasons 20 MG TABS tablet Take 1 tablet by mouth once daily  . budesonide-formoterol (SYMBICORT) 80-4.5 MCG/ACT inhaler Inhale 2 puffs into the lungs 2 (two) times daily. (Patient not taking: Reported on 11/19/2020)   No current facility-administered medications on file prior to visit.     Allergies  Allergen Reactions  . Penicillins Other (See Comments)    Patient passed out  . Sulfamethoxazole Rash    Review Of Systems:  Constitutional:   No  weight loss, night sweats,  Fevers, chills, fatigue, or  lassitude.  HEENT:   No headaches,  Difficulty swallowing,  Tooth/dental problems, or  Sore throat,                No sneezing, itching, ear ache, nasal congestion, post nasal drip,   CV:  No chest pain,  Orthopnea, PND, swelling in lower extremities, anasarca, dizziness, palpitations, syncope.   GI  No heartburn, indigestion, abdominal pain, nausea, vomiting, diarrhea, change in bowel habits, loss of appetite, bloody stools.   Resp: No shortness of breath with exertion or at rest.  No excess mucus, no productive cough,  No non-productive cough,  No coughing up of  blood.  No change in color of mucus.  No wheezing.  No chest wall deformity  Skin: no rash or lesions.  GU: no dysuria, change in color of urine, no urgency or frequency.  No flank pain, no hematuria   MS:  No joint pain or swelling.  No decreased range of motion.  No back pain.  Psych:  No change in mood or affect. No depression or anxiety.  No memory loss.   Vital Signs BP 110/72 (BP Location: Left Arm, Cuff Size: Normal)   Pulse 90   Temp 98 F (36.7 C) (Oral)   Ht 6' (1.829 m)   Wt 204 lb 12.8 oz (92.9 kg)   SpO2 96%   BMI 27.78 kg/m    Physical Exam:  General- No distress,  A&Ox3, pleasant ENT: No sinus tenderness, TM clear, pale nasal mucosa, no oral exudate,no post nasal drip, no LAN Cardiac: S1, S2, regular rate and rhythm, no murmur Chest: No wheeze/ rales/ dullness; no accessory muscle use, no nasal flaring, no sternal retractions, Right base rhonchi Abd.: Soft Non-tender, ND, BS +, Body mass index is 27.78 kg/m. Ext: No clubbing cyanosis, edema Neuro:  normal strength, MAE x 4, A&O x 3 Skin: No rashes, no lesions, warm and dry Psych: normal mood and behavior   Assessment/Plan  Abnormal Low Dose CT, Lung Cancer Screening scan Read as a Lung RADS 4 B indicates suspicious findings for which additional diagnostic testing and or tissue sampling is recommended. Pt was recovering from pneumonia and sick at the time of the screening scan. Scan reviewed by Dr. Valeta Wood and follow up CXR 12/15 ordered, with plans for a 6-8 week follow up scan. Plan CXR today We will schedule your Follow up LDCT within the next 2 weeks We will call to get this scheduled. We will call you with results.   Magdalen Spatz, NP 11/19/2020  2:14 PM

## 2020-11-19 NOTE — Patient Instructions (Addendum)
It is good to see you today. Congratulations on getting you Covid vaccines, Booster and Flu vaccine.  CXR today looks better We will schedule your Follow up LDCT within the next 2 weeks  We will call to get this scheduled. We will call you with results. Please contact office for sooner follow up if symptoms do not improve or worsen or seek emergency care  Have a great Holiday.

## 2020-11-21 ENCOUNTER — Telehealth: Payer: Self-pay | Admitting: Family Medicine

## 2020-11-21 NOTE — Progress Notes (Signed)
°  Chronic Care Management   Outreach Note  11/21/2020 Name: Carl Wood MRN: [id number] DOB: Jun 20, 1941  Referred by: Marin Olp, MD Reason for referral : No chief complaint on file.   A second unsuccessful telephone outreach was attempted today. The patient was referred to pharmacist for assistance with care management and care coordination.  Follow Up Plan:   Lauretta Grill Upstream Scheduler

## 2020-11-30 ENCOUNTER — Other Ambulatory Visit: Payer: Self-pay | Admitting: Internal Medicine

## 2020-12-01 ENCOUNTER — Telehealth (HOSPITAL_COMMUNITY): Payer: Self-pay

## 2020-12-01 NOTE — Telephone Encounter (Signed)
Patient called requesting samples of Jardiance and Xarelto. Samples were left up front for pickup and patient was notified via vm.  Medication Samples have been provided to the patient.  Drug name: Jardiance       Strength: 10mg         Qty: 4  LOT  Exp.Date: 08/2022  Dosing instructions: Take 1 tablet po QD  The patient has been instructed regarding the correct time, dose, and frequency of taking this medication, including desired effects and most common side effects.   Jontay Maston R Killian Schwer 2:58 PM 12/01/2020   Medication Samples have been provided to the patient.  Drug name: Xarelto       Strength: 15mg         Qty: 1  LOT: 12/03/2020  Exp.Date: 03/2021  Dosing instructions: take 1 tab po qd in addition with the 2.5mg  to equal 20mg   The patient has been instructed regarding the correct time, dose, and frequency of taking this medication, including desired effects and most common side effects.   Deshanae Lindo R Kyre Jeffries 2:58 PM 12/01/2020  Medication Samples have been provided to the patient.  Drug name: Xarelto       Strength: 2.5mg         Qty: 2  LOT: 04/2021  Exp.Date: 02/2021  Dosing instructions: take 1 tab po qd in addition with the 2.5mg  to equal 20mg    The patient has been instructed regarding the correct time, dose, and frequency of taking this medication, including desired effects and most common side effects.   Hakeen Shipes R Chidiebere Wynn 3:14 PM 12/01/2020

## 2020-12-02 ENCOUNTER — Other Ambulatory Visit (HOSPITAL_COMMUNITY): Payer: Self-pay | Admitting: Internal Medicine

## 2020-12-08 ENCOUNTER — Telehealth: Payer: Self-pay

## 2020-12-08 NOTE — Telephone Encounter (Signed)
Ok for virtual to discuss with you?

## 2020-12-08 NOTE — Telephone Encounter (Signed)
Left detailed voicemail. Team can call this in based off last result note if he calls back and opts for this

## 2020-12-08 NOTE — Telephone Encounter (Signed)
Pt asked if Dr. Durene Cal could call him and discuss with him why he wants pt to change medication from atorvastatin to rosuvastatin. Pt does not want to speak with the CMA. Please advise.

## 2020-12-09 ENCOUNTER — Ambulatory Visit (INDEPENDENT_AMBULATORY_CARE_PROVIDER_SITE_OTHER)
Admission: RE | Admit: 2020-12-09 | Discharge: 2020-12-09 | Disposition: A | Discharge status: Home / Self Care | Payer: Medicare HMO | Source: Ambulatory Visit | Attending: Acute Care | Admitting: Acute Care

## 2020-12-09 ENCOUNTER — Other Ambulatory Visit: Payer: Self-pay

## 2020-12-09 ENCOUNTER — Encounter: Payer: Self-pay | Admitting: Family Medicine

## 2020-12-09 DIAGNOSIS — I2510 Atherosclerotic heart disease of native coronary artery without angina pectoris: Secondary | ICD-10-CM | POA: Diagnosis not present

## 2020-12-09 DIAGNOSIS — I700 Atherosclerosis of aorta: Secondary | ICD-10-CM | POA: Diagnosis not present

## 2020-12-09 DIAGNOSIS — R911 Solitary pulmonary nodule: Secondary | ICD-10-CM

## 2020-12-09 DIAGNOSIS — R918 Other nonspecific abnormal finding of lung field: Secondary | ICD-10-CM | POA: Diagnosis not present

## 2020-12-10 ENCOUNTER — Other Ambulatory Visit (HOSPITAL_COMMUNITY): Payer: Self-pay

## 2020-12-10 MED ORDER — RIVAROXABAN 20 MG PO TABS
20.0000 mg | ORAL_TABLET | Freq: Every day | ORAL | 0 refills | Status: DC
Start: 2020-12-10 — End: 2021-12-28

## 2020-12-11 ENCOUNTER — Other Ambulatory Visit: Payer: Self-pay | Admitting: *Deleted

## 2020-12-11 ENCOUNTER — Other Ambulatory Visit: Payer: Self-pay

## 2020-12-11 DIAGNOSIS — Z87891 Personal history of nicotine dependence: Secondary | ICD-10-CM

## 2020-12-11 MED ORDER — ROSUVASTATIN CALCIUM 40 MG PO TABS: 40.0000 mg | ORAL_TABLET | Freq: Every day | ORAL | 3 refills | Status: DC

## 2020-12-11 NOTE — Progress Notes (Signed)
Please call patient and let him know his follow up LDCT is clear. Pneumonia has resolved. He needs a 12 month follow up scan. Please fax results to PCP and place order for 12 month follow up 12/2021. Thanks so much

## 2020-12-11 NOTE — Progress Notes (Signed)
See previous message

## 2020-12-19 ENCOUNTER — Telehealth: Payer: Self-pay | Admitting: Family Medicine

## 2020-12-19 NOTE — Progress Notes (Signed)
  Chronic Care Management   Note  12/19/2020 Name: [name] MRN: [id number] DOB: [birth year]  Carl Wood is a 80 y.o. year old male who is a primary care patient of Yong Channel, Brayton Mars, MD. I reached out to Carl Wood by phone today in response to a referral sent by [name] PCP, Marin Olp, MD.   [name] was given information about Chronic Care Management services today including:  1. CCM service includes personalized support from designated clinical staff supervised by his physician, including individualized plan of care and coordination with other care providers 2. 24/7 contact phone numbers for assistance for urgent and routine care needs. 3. Service will only be billed when office clinical staff spend 20 minutes or more in a month to coordinate care. 4. Only one practitioner may furnish and bill the service in a calendar month. 5. The patient may stop CCM services at any time (effective at the end of the month) by phone call to the office staff.   Patient did not agree to enrollment in care management services and does not wish to consider at this time.  Follow up plan:   SIGNATURE

## 2020-12-22 ENCOUNTER — Other Ambulatory Visit (HOSPITAL_COMMUNITY): Payer: Self-pay | Admitting: Internal Medicine

## 2020-12-24 ENCOUNTER — Other Ambulatory Visit: Payer: Self-pay

## 2020-12-24 ENCOUNTER — Encounter: Payer: Self-pay | Admitting: Cardiology

## 2020-12-24 ENCOUNTER — Ambulatory Visit: Payer: Medicare HMO | Admitting: Cardiology

## 2020-12-24 VITALS — BP 100/66 | HR 84 | Ht 72.0 in | Wt 204.0 lb

## 2020-12-24 DIAGNOSIS — G4733 Obstructive sleep apnea (adult) (pediatric): Secondary | ICD-10-CM

## 2020-12-24 DIAGNOSIS — I152 Hypertension secondary to endocrine disorders: Secondary | ICD-10-CM | POA: Diagnosis not present

## 2020-12-24 DIAGNOSIS — E1159 Type 2 diabetes mellitus with other circulatory complications: Secondary | ICD-10-CM | POA: Diagnosis not present

## 2020-12-24 NOTE — Progress Notes (Signed)
Cardiology Office Note  ID:  Mclain, Freer 1941-12-05, MRN [id number]  PCP:  Marin Olp, MD  Cardiologist:  Glori Bickers, MD Sleep Medicine:  Fransico Him, MD Electrophysiologist:  None   Chief Complaint:  OSA  History of Present Illness:    Carl Wood is a 80 y.o. male with a hx of moderate to severe OSA with an AHI of 25.2/hrand is on CPAP at8cm H2O.  He is doing well with his CPAP device and thinks that he has gotten used to it.  He tolerates the nasal mask and feels the pressure is adequate.  Since going on CPAP his feels rested in the am.  He does nap some during the day because he gets up at night to urinate. He does have problems with stuffed up nose at night.   He does not think that he snores.    Prior CV studies:   The following studies were reviewed today:  PAP compliance download  Past Medical History:  Diagnosis Date  . CAD (coronary artery disease)   . CHF (congestive heart failure) ([place])   . Chronic atrial fibrillation ([place])   . Colon polyps   . Diabetes mellitus   . Diverticulosis of colon   . DIVERTICULOSIS, COLON 10/16/2006   Qualifier: Diagnosis of  By: Leanne Chang MD, [place]    . Excessive daytime sleepiness 02/19/2016  . Hyperlipidemia   . Hypertension   . MI (mitral incompetence)   . Nephrolithiasis   . NEPHROLITHIASIS 06/26/2008   Qualifier: Diagnosis of  By: Sherlynn Stalls, CMA, [place]    . OSA (obstructive sleep apnea) 02/19/2016   Moderate to severe OSA with an AHI of 25/hr and on CPAP at 8cm H2O  . PE (pulmonary embolism)   . V-tach [facility])    Past Surgical History:  Procedure Laterality Date  . BACK SURGERY    . BASAL CELL CARCINOMA EXCISION     nose  . BIV UPGRADE N/A 03/28/2017   Procedure: BiV Upgrade;  Surgeon: Deboraha Sprang, MD;  Location: [place] CV [facility];  Service: Cardiovascular;  Laterality: N/A;  . CARDIAC CATHETERIZATION N/A 01/20/2016   Procedure: Right/Left Heart Cath and Coronary Angiography;  Surgeon: Jolaine Artist, MD;  Location: [place] CV LAB;  Service: Cardiovascular;  Laterality: N/A;  . CARDIAC DEFIBRILLATOR PLACEMENT     medtronic virtuoso  . CHOLECYSTECTOMY    . COLONOSCOPY  12/11/2012   Procedure: COLONOSCOPY;  Surgeon: Inda Castle, MD;  Location: [facility];  Service: Endoscopy;  Laterality: N/A;  . KNEE ARTHROPLASTY       Current Meds  Medication Sig  . ACCU-CHEK AVIVA PLUS test strip USE  STRIP TO CHECK GLUCOSE ONCE DAILY  . Ascorbic Acid (VITAMIN C PO) Take 1 tablet by mouth.  . budesonide-formoterol (SYMBICORT) 80-4.5 MCG/ACT inhaler Inhale 2 puffs into the lungs 2 (two) times daily.  . carvedilol (COREG) 12.5 MG tablet TAKE 1 TABLET BY MOUTH TWICE DAILY WITH  A  MEAL  . digoxin (LANOXIN) 0.25 MG tablet Take 1/2 (one-half) tablet by mouth once daily  . furosemide (LASIX) 20 MG tablet TAKE 1 TABLET BY MOUTH ONCE A WEEK  . glimepiride (AMARYL) 4 MG tablet TAKE 2 TABLETS BY MOUTH ONCE DAILY WITH BREAKFAST  . JARDIANCE 10 MG TABS tablet Take 1 tablet by mouth once daily  . metFORMIN (GLUCOPHAGE-XR) 500 MG 24 hr tablet Take 2 tablets (1,000 mg total) by mouth in the morning and at bedtime.  Marland Kitchen  Multiple Vitamins-Minerals (ZINC PO) Take 1 tablet by mouth.  . rivaroxaban (XARELTO) 20 MG TABS tablet Take 1 tablet (20 mg total) by mouth daily.  . rosuvastatin (CRESTOR) 40 MG tablet Take 1 tablet (40 mg total) by mouth daily.  . sacubitril-valsartan (ENTRESTO) 49-51 MG Take 1 tablet by mouth 2 (two) times daily.     Allergies:   Penicillins and Sulfamethoxazole   Social History   Tobacco Use  . Smoking status: Former Smoker    Packs/day: 1.00    Years: 50.00    Pack years: 50.00    Types: Cigarettes    Quit date: 12/15/2012    Years since quitting: 8.0  . Smokeless tobacco: Never Used  Vaping Use  . Vaping Use: Never used  Substance Use Topics  . Alcohol use: No    Alcohol/week: 0.0 standard drinks  . Drug use: No     Family Hx: The patient's family history  includes Bladder Cancer in his mother; Colon cancer in his father; Diabetes in his father; Heart attack (age of onset: 39) in his child; Heart attack (age of onset: 44) in his child; Heart disease in his father and mother.  ROS:   Please see the history of present illness.     All other systems reviewed and are negative.   Labs/Other Tests and Data Reviewed:    Recent Labs: 11/06/2020: ALT 19; BUN 16; Creat 0.82; Hemoglobin 15.1; Platelets 210; Potassium 4.3; Sodium 142   Recent Lipid Panel Lab Results  Component Value Date/Time   CHOL 128 07/03/2020 02:17 PM   TRIG 104 07/03/2020 02:17 PM   TRIG 53 10/10/2006 11:13 AM   HDL 33 (L) 07/03/2020 02:17 PM   CHOLHDL 3.9 07/03/2020 02:17 PM   LDLCALC 76 07/03/2020 02:17 PM   LDLDIRECT 77 11/06/2020 02:28 PM    Wt Readings from Last 3 Encounters:  12/24/20 204 lb (92.5 kg)  11/19/20 204 lb 12.8 oz (92.9 kg)  11/06/20 200 lb 12.8 oz (91.1 kg)     Objective:    Vital Signs:  BP 100/66   Pulse 84   Ht 6' (1.829 m)   Wt 204 lb (92.5 kg)   SpO2 96%   BMI 27.67 kg/m    GEN: Well nourished, well developed in no acute distress HEENT: Normal NECK: No JVD; No carotid bruits LYMPHATICS: No lymphadenopathy CARDIAC:RRR, no murmurs, rubs, gallops RESPIRATORY:  Clear to auscultation without rales, wheezing or rhonchi  ABDOMEN: Soft, non-tender, non-distended MUSCULOSKELETAL:  No edema; No deformity  SKIN: Warm and dry NEUROLOGIC:  Alert and oriented x 3 PSYCHIATRIC:  Normal affect   ASSESSMENT & PLAN:    1.  OSA - The patient is tolerating PAP therapy well without any problems. The PAP download was reviewed today and showed an AHI of 1.1/hr on 8 cm H2O with 93% compliance in using more than 4 hours nightly.  The patient has been using and benefiting from PAP use and will continue to benefit from therapy.  -I encouraged him to use nasal saline spray to help with dry nose and use his chin strap to help with dry mouth -I encouraged him  to adjust his humidity on his device and consider a room humidifier as well since his water chamber is dry in the am  2.  HTN -Bp is controlled on exam today -continue Carvedilol 12.5mg  BID, dig 0.125mg  daily, Entresto 49-51mg  BID.   Medication Adjustments/Labs and Tests Ordered: Current medicines are reviewed at length with the patient  today.  Concerns regarding medicines are outlined above.  Tests Ordered: No orders of the defined types were placed in this encounter.  Medication Changes: No orders of the defined types were placed in this encounter.   Disposition:  Follow up in 1 year(s)  Signed, Fransico Him, MD  12/24/2020 2:28 PM    [place]

## 2020-12-24 NOTE — Patient Instructions (Addendum)
Medication Instructions:  Your physician has recommended you make the following change in your medication:  1) START using saline nasal spray - 2 sprays twice per day   *If you need a refill on your cardiac medications before your next appointment, please call your pharmacy*  Follow-Up: At [facility], you and your health needs are our priority.  As part of our continuing mission to provide you with exceptional heart care, we have created designated Provider Care Teams.  These Care Teams include your primary Cardiologist (physician) and Advanced Practice Providers (APPs -  Physician Assistants and Nurse Practitioners) who all work together to provide you with the care you need, when you need it.  Your next appointment:   1 year(s)  The format for your next appointment:   In Person  Provider:   Fransico Him, MD

## 2020-12-25 DIAGNOSIS — L905 Scar conditions and fibrosis of skin: Secondary | ICD-10-CM | POA: Diagnosis not present

## 2020-12-25 DIAGNOSIS — Z85828 Personal history of other malignant neoplasm of skin: Secondary | ICD-10-CM | POA: Diagnosis not present

## 2020-12-25 DIAGNOSIS — D485 Neoplasm of uncertain behavior of skin: Secondary | ICD-10-CM | POA: Diagnosis not present

## 2020-12-25 DIAGNOSIS — L570 Actinic keratosis: Secondary | ICD-10-CM | POA: Diagnosis not present

## 2020-12-25 DIAGNOSIS — L819 Disorder of pigmentation, unspecified: Secondary | ICD-10-CM | POA: Diagnosis not present

## 2020-12-25 DIAGNOSIS — C44629 Squamous cell carcinoma of skin of left upper limb, including shoulder: Secondary | ICD-10-CM | POA: Diagnosis not present

## 2020-12-31 ENCOUNTER — Other Ambulatory Visit: Payer: Self-pay

## 2020-12-31 ENCOUNTER — Encounter (HOSPITAL_COMMUNITY): Payer: Self-pay | Admitting: Internal Medicine

## 2020-12-31 ENCOUNTER — Ambulatory Visit (HOSPITAL_COMMUNITY)
Admission: RE | Admit: 2020-12-31 | Discharge: 2020-12-31 | Disposition: A | Discharge status: Home / Self Care | Payer: Medicare HMO | Source: Ambulatory Visit | Attending: Internal Medicine | Admitting: Internal Medicine

## 2020-12-31 VITALS — BP 112/76 | HR 77 | Wt 205.0 lb

## 2020-12-31 DIAGNOSIS — Z87891 Personal history of nicotine dependence: Secondary | ICD-10-CM | POA: Diagnosis not present

## 2020-12-31 DIAGNOSIS — I252 Old myocardial infarction: Secondary | ICD-10-CM | POA: Diagnosis not present

## 2020-12-31 DIAGNOSIS — I482 Chronic atrial fibrillation, unspecified: Secondary | ICD-10-CM | POA: Insufficient documentation

## 2020-12-31 DIAGNOSIS — I2510 Atherosclerotic heart disease of native coronary artery without angina pectoris: Secondary | ICD-10-CM | POA: Insufficient documentation

## 2020-12-31 DIAGNOSIS — Z7951 Long term (current) use of inhaled steroids: Secondary | ICD-10-CM | POA: Insufficient documentation

## 2020-12-31 DIAGNOSIS — Z95810 Presence of automatic (implantable) cardiac defibrillator: Secondary | ICD-10-CM | POA: Insufficient documentation

## 2020-12-31 DIAGNOSIS — I110 Hypertensive heart disease with heart failure: Secondary | ICD-10-CM | POA: Diagnosis not present

## 2020-12-31 DIAGNOSIS — Z880 Allergy status to penicillin: Secondary | ICD-10-CM | POA: Insufficient documentation

## 2020-12-31 DIAGNOSIS — Z8673 Personal history of transient ischemic attack (TIA), and cerebral infarction without residual deficits: Secondary | ICD-10-CM | POA: Insufficient documentation

## 2020-12-31 DIAGNOSIS — G4733 Obstructive sleep apnea (adult) (pediatric): Secondary | ICD-10-CM | POA: Insufficient documentation

## 2020-12-31 DIAGNOSIS — Z79899 Other long term (current) drug therapy: Secondary | ICD-10-CM | POA: Diagnosis not present

## 2020-12-31 DIAGNOSIS — Z7984 Long term (current) use of oral hypoglycemic drugs: Secondary | ICD-10-CM | POA: Insufficient documentation

## 2020-12-31 DIAGNOSIS — R0602 Shortness of breath: Secondary | ICD-10-CM | POA: Insufficient documentation

## 2020-12-31 DIAGNOSIS — Z8249 Family history of ischemic heart disease and other diseases of the circulatory system: Secondary | ICD-10-CM | POA: Insufficient documentation

## 2020-12-31 DIAGNOSIS — I5022 Chronic systolic (congestive) heart failure: Secondary | ICD-10-CM | POA: Diagnosis not present

## 2020-12-31 DIAGNOSIS — Z882 Allergy status to sulfonamides status: Secondary | ICD-10-CM | POA: Insufficient documentation

## 2020-12-31 DIAGNOSIS — Z7901 Long term (current) use of anticoagulants: Secondary | ICD-10-CM | POA: Insufficient documentation

## 2020-12-31 NOTE — Progress Notes (Signed)
Medication Samples have been provided to the patient.  Drug name: Delene Loll       Strength: 49/51 mg        Qty: 1  LOT: [id number]  Exp.Date: 12/23  Dosing instructions: Take 1 tablet Twice daily   The patient has been instructed regarding the correct time, dose, and frequency of taking this medication, including desired effects and most common side effects.   [name] Avyanna Spada 4:08 PM 12/31/2020

## 2020-12-31 NOTE — Progress Notes (Addendum)
Advanced Heart Failure Clinic Note    Date:  12/31/2020   ID:  Carl Wood, DOB 13-May-1941, MRN [id number]  Location: Home  Provider location: [address], [place] Type of Visit: Established patient PCP:  Marin Olp, MD  Cardiologist:Dr Turner  Primary HF: Dr Haroldine Laws  EP: Dr Caryl Comes    History of Present Illness:  Carl Wood is a 80 year old with a history of CAD s/p MI 1983, ICM with chronic systolic heart failure s/p Medtronic single chamber ICD (EF 20% on last echo 8/19), CVA 2014, DM , VT, PE, HTN, permanent A fib on Xarelto, SDH 2019,  OSA, and former smoker.   Had CRT-D upgrade in April 2018.   In 12/18 saw Dr. Tomi Likens for chronic HA. Found to have small SDHs that were felt to be chronic after a previous fall when trimming vines. Xarelto stopped. F/u C 2/19 SDHs resolved. Xarelto restarted in 2/19  Today he returns for HF follow up with his wife. Says he is doing ok. Can do all ADLs and go to the store but says legs get tired and has to take a break and then gets up and goes again. Gets SOB on steps or a hill. No CP, edema, orthopnea or PND. No dizziness. Wears CPAP regularly. No bleeding with Xarelto.   12/2019 Echo  EF 20-25%  Past Medical History:  Diagnosis Date  . CAD (coronary artery disease)   . CHF (congestive heart failure) ([place])   . Chronic atrial fibrillation ([place])   . Colon polyps   . Diabetes mellitus   . Diverticulosis of colon   . DIVERTICULOSIS, COLON 10/16/2006   Qualifier: Diagnosis of  By: Leanne Chang MD, [place]    . Excessive daytime sleepiness 02/19/2016  . Hyperlipidemia   . Hypertension   . MI (mitral incompetence)   . Nephrolithiasis   . NEPHROLITHIASIS 06/26/2008   Qualifier: Diagnosis of  By: Sherlynn Stalls, CMA, [place]    . OSA (obstructive sleep apnea) 02/19/2016   Moderate to severe OSA with an AHI of 25/hr and on CPAP at 8cm H2O  . PE (pulmonary embolism)   . V[facility]tach [facility])    Past Surgical History:  Procedure Laterality  Date  . BACK SURGERY    . BASAL CELL CARCINOMA EXCISION     nose  . BIV UPGRADE N/A 03/28/2017   Procedure: BiV Upgrade;  Surgeon: Deboraha Sprang, MD;  Location: [place] CV LAB;  Service: Cardiovascular;  Laterality: N/A;  . CARDIAC CATHETERIZATION N/A 01/20/2016   Procedure: Right/Left Heart Cath and Coronary Angiography;  Surgeon: Jolaine Artist, MD;  Location: [place] CV LAB;  Service: Cardiovascular;  Laterality: N/A;  . CARDIAC DEFIBRILLATOR PLACEMENT     medtronic virtuoso  . CHOLECYSTECTOMY    . COLONOSCOPY  12/11/2012   Procedure: COLONOSCOPY;  Surgeon: Inda Castle, MD;  [facility];  Service: Endoscopy;  Laterality: N/A;  . KNEE ARTHROPLASTY       Current Outpatient Medications  Medication Sig Dispense Refill  . ACCU-CHEK AVIVA PLUS test strip USE  STRIP TO CHECK GLUCOSE ONCE DAILY 100 each 4  . Ascorbic Acid (VITAMIN C PO) Take 1 tablet by mouth.    . budesonide-formoterol (SYMBICORT) 80-4.5 MCG/ACT inhaler Inhale 2 puffs into the lungs 2 (two) times daily. 1 Inhaler 2  . carvedilol (COREG) 12.5 MG tablet TAKE 1 TABLET BY MOUTH TWICE DAILY WITH  A  MEAL 180 tablet 0  . digoxin (  LANOXIN) 0.25 MG tablet Take 1/2 (one-half) tablet by mouth once daily 45 tablet 2  . furosemide (LASIX) 20 MG tablet Take 20 mg by mouth as needed.    Marland Kitchen glimepiride (AMARYL) 4 MG tablet TAKE 2 TABLETS BY MOUTH ONCE DAILY WITH BREAKFAST 180 tablet 0  . JARDIANCE 10 MG TABS tablet Take 1 tablet by mouth once daily 30 tablet 0  . metFORMIN (GLUCOPHAGE-XR) 500 MG 24 hr tablet Take 2 tablets (1,000 mg total) by mouth in the morning and at bedtime. 120 tablet 11  . rivaroxaban (XARELTO) 20 MG TABS tablet Take 1 tablet (20 mg total) by mouth daily. 30 tablet 0  . rosuvastatin (CRESTOR) 40 MG tablet Take 1 tablet (40 mg total) by mouth daily. 90 tablet 3  . sacubitril-valsartan (ENTRESTO) 49-51 MG Take 1 tablet by mouth 2 (two) times daily. 60 tablet 0  . sodium chloride (OCEAN) 0.65 %  SOLN nasal spray Place 2 sprays into both nostrils in the morning and at bedtime.     No current facility-administered medications for this encounter.    Allergies:   Penicillins and Sulfamethoxazole   Social History:  The patient  reports that he quit smoking about 8 years ago. His smoking use included cigarettes. He has a 50.00 pack-year smoking history. He has never used smokeless tobacco. He reports that he does not drink alcohol and does not use drugs.   Family History:  The patient's family history includes Bladder Cancer in his mother; Colon cancer in his father; Diabetes in his father; Heart attack (age of onset: 50) in his child; Heart attack (age of onset: 43) in his child; Heart disease in his father and mother.   ROS:  Please see the history of present illness.   All other systems are personally reviewed and negative.   Vitals:   12/31/20 1521  BP: 112/76  Pulse: 77  SpO2: 94%    Exam:  General:  Well appearing. No resp difficulty HEENT: normal Neck: supple. no JVD. Carotids 2+ bilat; no bruits. No lymphadenopathy or thryomegaly appreciated. Cor: PMI nondisplaced. Irregular rate & rhythm. No rubs, gallops or murmurs. Lungs: clear Abdomen: soft, nontender, nondistended. No hepatosplenomegaly. No bruits or masses. Good bowel sounds. Extremities: no cyanosis, clubbing, rash, edema Neuro: alert & orientedx3, cranial nerves grossly intact. moves all 4 extremities w/o difficulty. Affect pleasant   Recent Labs: 11/06/2020: ALT 19; BUN 16; Creat 0.82; Hemoglobin 15.1; Platelets 210; Potassium 4.3; Sodium 142  Personally reviewed   Wt Readings from Last 3 Encounters:  12/31/20 93 kg (205 lb)  12/24/20 92.5 kg (204 lb)  11/19/20 92.9 kg (204 lb 12.8 oz)    ASSESSMENT AND PLAN:  1. Chronic Systolic HF: ICM, s/p Medtronic Bi-V ICD (upgrade 4/18). Echo 09/2016 EF 25-30%. ECHO 8/19 EF 20% - Echo 12/11/18 EF 20-25% RV ok  - Doing well. Stable NYHA II-early III. Volume status  stable.  - CPX testing on 5/18 pVO2 down 14.5->13.0 - Continue current dose carvedilol to 12.5 bid - Continue digoxin.  - Continue Entresto 49/51 mg BID. - Continue Jardiance. - No Spiro with history of hyperkalemia and soft BP.  - ICD interrogated- 97% BIv pacing. No VT  Activity ~ 1.2 hours. Fluid ok. Personally reviewed - CPX test reviewed with him previously and does qualify for advanced therapies but he is doing quite well and CPX also shows a lung limitation so I do not think VAD is a good option for him at this point. We will  continue to follow closely. ICD interrogated personally in clinic and looks fine. (No VT, fluid ok, 100% BiV pacing)   2. OSA - Continue CPAP. Follows with Dr. Radford Pax - Saw Dr. Radford Pax this month. Download looked good AHI 1.1/hr  3. Chronic A fib - Rate controlled - Xarelto restarted after resolution SDH.   4. CAD: - Has known distal LAD lesion 95% and mid LAD 40%. No benefit to revascularization given wall motion abnormality.  - No s/s angina -  Continue statin. Off ASA with Xarelto     Signed, Glori Bickers, MD  3:39 PM   [facility] [address] [facility] [place] [address] [phone] (office) [phone] (fax)

## 2020-12-31 NOTE — Patient Instructions (Signed)
Labs done today, your results will be available in MyChart, we will contact you for abnormal readings.  Please call our office in June 2022 for an appointment   If you have any questions or concerns before your next appointment please send us a message through mychart or call our office at [phone].    TO LEAVE A MESSAGE FOR THE NURSE SELECT OPTION 2, PLEASE LEAVE A MESSAGE INCLUDING: . YOUR NAME . DATE OF BIRTH . CALL BACK NUMBER . REASON FOR CALL**this is important as we prioritize the call backs  YOU WILL RECEIVE A CALL BACK THE SAME DAY AS LONG AS YOU CALL BEFORE 4:00 PM  At the [facility], you and your health needs are our priority. As part of our continuing mission to provide you with exceptional heart care, we have created designated Provider Care Teams. These Care Teams include your primary Cardiologist (physician) and Advanced Practice Providers (APPs- Physician Assistants and Nurse Practitioners) who all work together to provide you with the care you need, when you need it.   You may see any of the following providers on your designated Care Team at your next follow up: . Dr Daniel Bensimhon . Dr Dalton McLean . [name], NP . Brittainy Simmons, PA . Jessica Milford,NP . Lauren Kemp, PharmD   Please be sure to bring in all your medications bottles to every appointment.    

## 2020-12-31 NOTE — Addendum Note (Signed)
Encounter addended by: Stanford Scotland, RN on: 12/31/2020 4:09 PM  Actions taken: Clinical Note Signed

## 2020-12-31 NOTE — Addendum Note (Signed)
Encounter addended by: Stanford Scotland, RN on: 12/31/2020 4:04 PM  Actions taken: Order list changed, Diagnosis association updated, Charge Capture section accepted, Clinical Note Signed

## 2021-01-01 LAB — CBC
HCT: 52 % (ref 39.0–52.0)
Hemoglobin: 16.5 g/dL (ref 13.0–17.0)
MCH: 32.2 pg (ref 26.0–34.0)
MCHC: 31.7 g/dL (ref 30.0–36.0)
MCV: 101.6 fL — ABNORMAL HIGH (ref 80.0–100.0)
Platelets: 177 10*3/uL (ref 150–400)
RBC: 5.12 MIL/uL (ref 4.22–5.81)
RDW: 13.4 % (ref 11.5–15.5)
WBC: 7 10*3/uL (ref 4.0–10.5)
nRBC: 0 % (ref 0.0–0.2)

## 2021-01-01 LAB — BASIC METABOLIC PANEL
Anion gap: 17 — ABNORMAL HIGH (ref 5–15)
BUN: 20 mg/dL (ref 8–23)
CO2: 25 mmol/L (ref 22–32)
Calcium: 10 mg/dL (ref 8.9–10.3)
Chloride: 99 mmol/L (ref 98–111)
Creatinine, Ser: 0.9 mg/dL (ref 0.61–1.24)
GFR, Estimated: >60 mL/min (ref >60)
Glucose, Bld: 106 mg/dL — ABNORMAL HIGH (ref 70–99)
Potassium: 4.1 mmol/L (ref 3.5–5.1)
Sodium: 141 mmol/L (ref 135–145)

## 2021-01-01 LAB — BRAIN NATRIURETIC PEPTIDE: B Natriuretic Peptide: 82.7 pg/mL (ref 0.0–100.0)

## 2021-01-01 LAB — DIGOXIN LEVEL: Digoxin Level: 0.5 ng/mL — ABNORMAL LOW (ref 0.8–2.0)

## 2021-01-05 ENCOUNTER — Other Ambulatory Visit: Payer: Self-pay | Admitting: Family Medicine

## 2021-01-12 ENCOUNTER — Other Ambulatory Visit: Payer: Self-pay | Admitting: Family Medicine

## 2021-01-22 ENCOUNTER — Telehealth (HOSPITAL_COMMUNITY): Payer: Self-pay | Admitting: Pharmacy Technician

## 2021-01-22 NOTE — Telephone Encounter (Signed)
Advanced Heart Failure Patient Advocate Encounter   Patient was approved to receive Entresto from [employer]  Patient ID: [id number] Effective dates: 01/21/21 through 12/05/21  Spoke with patient's wife.  Charlann Boxer, CPhT

## 2021-01-26 ENCOUNTER — Encounter: Payer: Medicare HMO | Admitting: Internal Medicine

## 2021-01-27 ENCOUNTER — Ambulatory Visit (INDEPENDENT_AMBULATORY_CARE_PROVIDER_SITE_OTHER): Payer: Medicare HMO

## 2021-01-27 DIAGNOSIS — I472 Ventricular tachycardia, unspecified: Secondary | ICD-10-CM

## 2021-01-27 LAB — CUP PACEART REMOTE DEVICE CHECK
Battery Remaining Longevity: 23 mo
Battery Voltage: 2.93 V
Brady Statistic AP VP Percent: 0 %
Brady Statistic AP VS Percent: 0 %
Brady Statistic AS VP Percent: 0 %
Brady Statistic AS VS Percent: 0 %
Brady Statistic RA Percent Paced: 0 %
Brady Statistic RV Percent Paced: 96.52 %
Date Time Interrogation Session: 20220222001602
HighPow Impedance: 75 Ohm
Implantable Lead Implant Date: 20010723
Implantable Lead Implant Date: 20180423
Implantable Lead Location: 753858
Implantable Lead Location: 753860
Implantable Lead Model: 6943
Implantable Pulse Generator Implant Date: 20180423
Lead Channel Impedance Value: 1007 Ohm
Lead Channel Impedance Value: 1007 Ohm
Lead Channel Impedance Value: 270.667
Lead Channel Impedance Value: 270.667
Lead Channel Impedance Value: 275.5 Ohm
Lead Channel Impedance Value: 283.733
Lead Channel Impedance Value: 289.049
Lead Channel Impedance Value: 304 Ohm
Lead Channel Impedance Value: 399 Ohm
Lead Channel Impedance Value: 4047 Ohm
Lead Channel Impedance Value: 532 Ohm
Lead Channel Impedance Value: 551 Ohm
Lead Channel Impedance Value: 551 Ohm
Lead Channel Impedance Value: 608 Ohm
Lead Channel Impedance Value: 950 Ohm
Lead Channel Impedance Value: 950 Ohm
Lead Channel Impedance Value: 950 Ohm
Lead Channel Impedance Value: 988 Ohm
Lead Channel Pacing Threshold Amplitude: 1.5 V
Lead Channel Pacing Threshold Amplitude: 1.5 V
Lead Channel Pacing Threshold Pulse Width: 0.4 ms
Lead Channel Pacing Threshold Pulse Width: 1 ms
Lead Channel Sensing Intrinsic Amplitude: 10.625 mV
Lead Channel Sensing Intrinsic Amplitude: 10.625 mV
Lead Channel Setting Pacing Amplitude: 2 V
Lead Channel Setting Pacing Amplitude: 3 V
Lead Channel Setting Pacing Pulse Width: 0.4 ms
Lead Channel Setting Pacing Pulse Width: 1 ms
Lead Channel Setting Sensing Sensitivity: 0.3 mV

## 2021-01-30 ENCOUNTER — Other Ambulatory Visit (HOSPITAL_COMMUNITY): Payer: Self-pay | Admitting: Internal Medicine

## 2021-01-30 ENCOUNTER — Other Ambulatory Visit: Payer: Self-pay

## 2021-01-30 ENCOUNTER — Ambulatory Visit (HOSPITAL_COMMUNITY)
Admission: RE | Admit: 2021-01-30 | Discharge: 2021-01-30 | Disposition: A | Discharge status: Home / Self Care | Payer: Medicare HMO | Source: Ambulatory Visit | Attending: Cardiology | Admitting: Cardiology

## 2021-01-30 DIAGNOSIS — I5022 Chronic systolic (congestive) heart failure: Secondary | ICD-10-CM

## 2021-01-30 DIAGNOSIS — I6522 Occlusion and stenosis of left carotid artery: Secondary | ICD-10-CM

## 2021-01-31 ENCOUNTER — Other Ambulatory Visit: Payer: Self-pay | Admitting: Family Medicine

## 2021-02-02 ENCOUNTER — Other Ambulatory Visit: Payer: Self-pay | Admitting: Family Medicine

## 2021-02-04 NOTE — Progress Notes (Signed)
Remote ICD transmission.   

## 2021-03-06 NOTE — Patient Instructions (Addendum)
Please stop by lab before you go If you have mychart- we will send your results within 3 business days of [place] receiving them.  If you do not have mychart- we will call you about results within 5 business days of [place] receiving them.  *please also note that you will see labs on mychart as soon as they post. I will later go in and write notes on them- will say "notes from Dr. Yong Channel"  Team please enter prevnar [phone] had at [facility] March 02 2021.   For booster [email or web address]

## 2021-03-06 NOTE — Progress Notes (Signed)
Phone: [phone]   Subjective:  Patient presents today for their annual physical. Chief complaint-noted.   See problem oriented charting- ROS- full  review of systems was completed and negative  except for: neck pain/stiffness  The following were reviewed and entered/updated in epic: Past Medical History:  Diagnosis Date  . CAD (coronary artery disease)   . CHF (congestive heart failure) ([place])   . Chronic atrial fibrillation ([place])   . Colon polyps   . Diabetes mellitus   . Diverticulosis of colon   . DIVERTICULOSIS, COLON 10/16/2006   Qualifier: Diagnosis of  By: Leanne Chang MD, [place]    . Excessive daytime sleepiness 02/19/2016  . Hyperlipidemia   . Hypertension   . MI (mitral incompetence)   . Nephrolithiasis   . NEPHROLITHIASIS 06/26/2008   Qualifier: Diagnosis of  By: Sherlynn Stalls, CMA, [place]    . OSA (obstructive sleep apnea) 02/19/2016   Moderate to severe OSA with an AHI of 25/hr and on CPAP at 8cm H2O  . PE (pulmonary embolism)   . V-tach [facility])    Patient Active Problem List   Diagnosis Date Noted  . COPD (chronic obstructive pulmonary disease) ([place]) 12/26/2018    Priority: High  . History of subdural hematoma 11/21/2017    Priority: High  . Chronic atrial fibrillation (HCC)     Priority: High  . Chronic systolic heart failure ([place]) 08/06/2015    Priority: High  . Former smoker 11/15/2014    Priority: High  . History of cardioembolic cerebrovascular accident (CVA) 12/19/2012    Priority: High  .  ventricular tachycardia-non sustained  02/17/2012    Priority: High  . Diabetes mellitus type II, controlled ([place]) 10/16/2006    Priority: High  . CAD (coronary artery disease) 10/16/2006    Priority: High  . Chronic pulmonary embolism ([place]) 10/16/2006    Priority: High  . Dyspnea on exertion 08/22/2017    Priority: Medium  . OSA (obstructive sleep apnea) 02/19/2016    Priority: Medium  . Carotid artery stenosis 03/17/2015    Priority: Medium  . Implantable  cardioverter-defibrillator (ICD) in situ 02/17/2012    Priority: Medium  . History of skin cancer 07/05/2007    Priority: Medium  . Hyperlipidemia associated with type 2 diabetes mellitus ([place]) 10/16/2006    Priority: Medium  . Hypertension associated with diabetes ([place]) 10/16/2006    Priority: Medium  . Osteoarthritis of right knee 09/22/2018    Priority: Low  . Frontal headache 10/06/2017    Priority: Low  . Rhinitis 08/22/2017    Priority: Low  . LBBB (left bundle branch block) 08/06/2015    Priority: Low  . Actinic keratosis 11/15/2014    Priority: Low  . Benign neoplasm of colon 12/11/2012    Priority: Low   Past Surgical History:  Procedure Laterality Date  . BACK SURGERY    . BASAL CELL CARCINOMA EXCISION     nose  . BIV UPGRADE N/A 03/28/2017   Procedure: BiV Upgrade;  Surgeon: Deboraha Sprang, MD;  Location: [place] [facility];  Service: Cardiovascular;  Laterality: N/A;  . CARDIAC CATHETERIZATION N/A 01/20/2016   Procedure: Right/Left Heart Cath and Coronary Angiography;  Surgeon: Jolaine Artist, MD;  Location: [place] [facility];  Service: Cardiovascular;  Laterality: N/A;  . CARDIAC DEFIBRILLATOR PLACEMENT     medtronic virtuoso  . CHOLECYSTECTOMY    . COLONOSCOPY  12/11/2012   Procedure: COLONOSCOPY;  Surgeon: Inda Castle, MD;  [facility];  Service: Endoscopy;  Laterality: N/A;  . KNEE ARTHROPLASTY      Family History  Problem Relation Age of Onset  . Colon cancer Father   . Diabetes Father   . Heart disease Father   . Bladder Cancer Mother   . Heart disease Mother   . Heart attack Child 28  . Heart attack Child 47    Medications- reviewed and updated Current Outpatient Medications  Medication Sig Dispense Refill  . ACCU-CHEK AVIVA PLUS test strip USE  STRIP TO CHECK GLUCOSE ONCE DAILY 100 each 0  . Ascorbic Acid (VITAMIN C PO) Take 1 tablet by mouth.    . budesonide-formoterol (SYMBICORT) 80-4.5 MCG/ACT inhaler Inhale 2 puffs into the  lungs 2 (two) times daily. 1 Inhaler 2  . carvedilol (COREG) 12.5 MG tablet TAKE 1 TABLET BY MOUTH TWICE DAILY WITH  A  MEAL 180 tablet 0  . digoxin (LANOXIN) 0.25 MG tablet Take 1/2 (one-half) tablet by mouth once daily 45 tablet 2  . furosemide (LASIX) 20 MG tablet Take 20 mg by mouth as needed.    Marland Kitchen glimepiride (AMARYL) 4 MG tablet TAKE 2 TABLETS BY MOUTH ONCE DAILY WITH BREAKFAST 180 tablet 0  . JARDIANCE 10 MG TABS tablet Take 1 tablet by mouth once daily 30 tablet 0  . metFORMIN (GLUCOPHAGE-XR) 500 MG 24 hr tablet Take 2 tablets (1,000 mg total) by mouth in the morning and at bedtime. 120 tablet 11  . rivaroxaban (XARELTO) 20 MG TABS tablet Take 1 tablet (20 mg total) by mouth daily. 30 tablet 0  . rosuvastatin (CRESTOR) 40 MG tablet Take 1 tablet (40 mg total) by mouth daily. 90 tablet 3  . sacubitril-valsartan (ENTRESTO) 49-51 MG Take 1 tablet by mouth 2 (two) times daily. 60 tablet 0  . sodium chloride (OCEAN) 0.65 % SOLN nasal spray Place 2 sprays into both nostrils in the morning and at bedtime.     No current facility-administered medications for this visit.    Allergies-reviewed and updated Allergies  Allergen Reactions  . Penicillins Other (See Comments)    Patient passed out  . Sulfamethoxazole Rash    Social History   Social History Narrative   Married. 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 Oropharynx normal Neck: no thyromegaly CV: RRR no murmurs rubs or gallops Lungs: CTAB no crackles, wheeze (except for occasional slight wheeze),  rhonchi Abdomen: soft/nontender/nondistended/normal bowel sounds. No rebound or guarding.  Ext: no edema Skin: warm, dry Neuro: grossly normal, moves all extremities, PERRLA, hard of hearing to some degree with masks   Assessment and Plan  80 y.o. male presenting for annual physical.  Health Maintenance counseling: 1. Anticipatory guidance: Patient counseled regarding regular dental exams -q6 months advised-saw yesterday, eye exams -yearly,  avoiding smoking and second hand smoke , limiting alcohol to 2 beverages per day that she does not drink.   2. Risk factor reduction:  Advised patient of need for regular exercise and diet rich and fruits  and vegetables to reduce risk of heart attack and stroke. Exercise-still trying to do yard work as able- encouraged movement as well even in the house. Diet-try and eat reasonably healthy/low-salt diet. Weight down slightly  Wt Readings from Last 3 Encounters:  03/10/21 202 lb 3.2 oz (91.7 kg)  12/31/20 205 lb (93 kg)  12/24/20 204 lb (92.5 kg)  3. Immunizations/screenings/ancillary studies-discussed Prevnar 20 (already had) as well as covid-19 booster #2 at pharmacy  Immunization History  Administered Date(s) Administered  . DTaP 09/01/2011  . Fluad Quad(high Dose 65+) 08/16/2019, 10/16/2020  . Influenza Split 09/08/2011  . Influenza Whole 08/23/2008, 09/10/2009, 09/04/2010  . Influenza, High Dose Seasonal PF 09/07/2013, 09/17/2014, 09/10/2016, 08/11/2017, 09/04/2018  . Influenza-Unspecified 09/03/2015  . PFIZER(Purple Top)SARS-COV-2 Vaccination 12/21/2019, 02/08/2020, 09/30/2020  . Pneumococcal Conjugate-13 11/15/2014  . Pneumococcal Polysaccharide-23 09/08/2011  . Tdap 11/15/2014  . Zoster 02/06/2008  . Zoster Recombinat (Shingrix) 05/09/2018, 08/08/2018  4. Prostate cancer screening-  Past age based screening recommendations.  No change in urination pattern-stable nocturia for instance  Lab Results  Component Value Date   PSA 0.85 10/27/2012    PSA 1.08 12/10/2008   PSA 1.41 02/06/2007   5. Colon cancer screening - past age based screening recommendations.  Originally had planned for repeat colonoscopy 2019 by Dr. Loletha Carrow was okay with not repeating due to complexity of other medical illnesses 6. Skin cancer screening-Dr. Pearline Cables would like dermatology. Last visit 2 months ago 7. FORMER smoker-50 pack years but quit smoking 2013-on lung cancer screening program. Get ua as well 8. STD screening - only active with wife  Status of chronic or acute concerns    #Chronic systolic heart failure-follows with Dr. Haroldine Laws #ICD in place with history of ventricular tachycardia S: Compliant with carvedilol 12.5 mg twice a day, digoxin, Lasix 20 mg-just as neede dif weight up, Entresto.  Also on Jardiance for diabetes which is favorable for heart failure patients -ICD monitored by cardiology A/P: appears stable- continue current meds    # Atrial fibrillation chronic/ history of cardioemolic CVA/history subdural hematoma/history of chronic PE as well- xarelto reduces risk S: Compliant with carvedilol 12.5 mg twice a day for rate control and Xarelto 20 mg for anticoagulation. -Does have history of subdural hematoma-Xarelto was held short-term but was restarted once resolved -History of cardioembolic strokes we prefer to have him on Xarelto as long as no falls A/P: doing well with xarelto- continue current meds. For a fib continue carvedilol for rate control. No evidence of recurrent stroke or subudral hematoma   #CAD/hyperlipidemia/carotid artery stenosis S: History of MI in 1983.  Denies history of stents or bypass-streptokinase was used in the past per patient. -Due to bleeding risk is on Xarelto alone and not on aspirin -Patient compliant with rosuvastatin 40 mg daily.   Lab Results  Component Value Date   CHOL 128 07/03/2020   HDL 33 (L) 07/03/2020   LDLCALC 76 07/03/2020   LDLDIRECT 77 11/06/2020   TRIG 104 07/03/2020   CHOLHDL 3.9  07/03/2020   A/P: check non fasting labs today- hoping LDL under 70. CAD without chest pain- stbale shortness of breath but copd contributes -Carotid artery stenosis in 2015.  Only 1 to 39% stenosis-stable 2022 and could repeat 2024. Continue risk factor modification  #COPD S: Compliant with Symbicort 80-4.5 mcg/act 2 puffs twice a day in past- has held off recently  -did for a week recently have some throat congestion with green sputum but that has cleared up other than  mild dry cough A/P: he has decided to pull back on symbicort- can definitely use if respiratory symptoms worsen again    # Diabetes S: Compliant with metformin 1000mg  BID, jardiance 10mg  (q2 hour urination so wants to avoid increase - still the case), amaryl 8 mg CBGs- 125-160 in am Exercise and diet- see above  Lab Results  Component Value Date   HGBA1C 7.4 (H) 11/06/2020   HGBA1C 7.7 (H) 07/03/2020   HGBA1C 8.1 (H) 01/03/2020  A/P: hopefully a1c at least under 8- reasonable target for him- hopefully continue current meds  #Hypertension S: Compliant with carvedilol, Entresto, Lasix.  Blood pressure traditionally runs very low but these medications have been required due to CHF   BP Readings from Last 3 Encounters:  03/10/21 104/64  12/31/20 112/76  12/24/20 100/66   A/P: Reasonable control-continue current medication  #OSA-compliant with CPAP   #some neck pain/tension- discussed possible PT referral he wants to hold off for now. No feer or chills. Could have underlying OA  # B12 deficiency S: Current treatment/medication (oral vs. IM): takes B12 supplement  Lab Results  Component Value Date   [id number] 01/03/2020  A/P: history of b12 deficiency in 2019- update levels today   Recommended follow up: Return in about 4 months (around 07/10/2021) for follow up- or sooner if needed. Future Appointments  Date Time Provider [place]  04/14/2021  3:00 PM Deboraha Sprang, MD [facility]   04/28/2021  7:20 AM [facility] DEVICE REMOTES [facility]  07/28/2021  7:20 AM [facility] DEVICE REMOTES [facility]  10/27/2021  7:20 AM [facility] DEVICE REMOTES [facility]  01/26/2022  7:20 AM [facility] DEVICE REMOTES [facility]  04/27/2022  7:20 AM [facility] DEVICE REMOTES [facility]     Lab/Order associations:non  fasting   ICD-10-CM   1. Preventative health care  Z00.00 CBC with Differential/Platelet    Comprehensive metabolic panel    Lipid panel    Hemoglobin A1c    Vitamin B12    POCT Urinalysis Dipstick (Automated)  2. Hypertension associated with diabetes ([place])  E11.59 CBC with Differential/Platelet   I15.2 Comprehensive metabolic panel    Lipid panel  3. Hyperlipidemia associated with type 2 diabetes mellitus (HCC)  E11.69 CBC with Differential/Platelet   E78.5 Comprehensive metabolic panel    Lipid panel  4. Controlled type 2 diabetes mellitus without complication, without long-term current use of insulin (HCC)  E11.9 CBC with Differential/Platelet    Comprehensive metabolic panel    Lipid panel    Hemoglobin A1c  5. Former smoker  Z87.891 POCT Urinalysis Dipstick (Automated)  6. Other chronic pulmonary embolism without acute cor pulmonale (HCC) Chronic I27.82   7. Chronic obstructive pulmonary disease, unspecified COPD type ([place]) Chronic J44.9   8. B12 deficiency  E53.8 Vitamin B12    No orders of the defined types were placed in this encounter.   Return precautions advised.  [name], MD

## 2021-03-09 ENCOUNTER — Other Ambulatory Visit: Payer: Self-pay | Admitting: Family Medicine

## 2021-03-10 ENCOUNTER — Other Ambulatory Visit: Payer: Self-pay

## 2021-03-10 ENCOUNTER — Ambulatory Visit (INDEPENDENT_AMBULATORY_CARE_PROVIDER_SITE_OTHER): Payer: Medicare HMO | Admitting: Family Medicine

## 2021-03-10 ENCOUNTER — Encounter: Payer: Self-pay | Admitting: Family Medicine

## 2021-03-10 VITALS — BP 104/64 | HR 81 | Temp 98.0°F | Resp 16 | Ht 72.0 in | Wt 202.2 lb

## 2021-03-10 DIAGNOSIS — E538 Deficiency of other specified B group vitamins: Secondary | ICD-10-CM | POA: Diagnosis not present

## 2021-03-10 DIAGNOSIS — Z0000 Encounter for general adult medical examination without abnormal findings: Secondary | ICD-10-CM | POA: Diagnosis not present

## 2021-03-10 DIAGNOSIS — E1169 Type 2 diabetes mellitus with other specified complication: Secondary | ICD-10-CM | POA: Diagnosis not present

## 2021-03-10 DIAGNOSIS — I152 Hypertension secondary to endocrine disorders: Secondary | ICD-10-CM

## 2021-03-10 DIAGNOSIS — J449 Chronic obstructive pulmonary disease, unspecified: Secondary | ICD-10-CM

## 2021-03-10 DIAGNOSIS — I5022 Chronic systolic (congestive) heart failure: Secondary | ICD-10-CM | POA: Diagnosis not present

## 2021-03-10 DIAGNOSIS — E1159 Type 2 diabetes mellitus with other circulatory complications: Secondary | ICD-10-CM

## 2021-03-10 DIAGNOSIS — I2782 Chronic pulmonary embolism: Secondary | ICD-10-CM | POA: Diagnosis not present

## 2021-03-10 DIAGNOSIS — E785 Hyperlipidemia, unspecified: Secondary | ICD-10-CM | POA: Diagnosis not present

## 2021-03-10 DIAGNOSIS — Z87891 Personal history of nicotine dependence: Secondary | ICD-10-CM

## 2021-03-10 DIAGNOSIS — E119 Type 2 diabetes mellitus without complications: Secondary | ICD-10-CM | POA: Diagnosis not present

## 2021-03-10 LAB — COMPREHENSIVE METABOLIC PANEL
ALT: 19 U/L (ref 0–53)
AST: 15 U/L (ref 0–37)
Albumin: 4.2 g/dL (ref 3.5–5.2)
Alkaline Phosphatase: 64 U/L (ref 39–117)
BUN: 18 mg/dL (ref 6–23)
CO2: 31 mEq/L (ref 19–32)
Calcium: 9.8 mg/dL (ref 8.4–10.5)
Chloride: 101 mEq/L (ref 96–112)
Creatinine, Ser: 0.98 mg/dL (ref 0.40–1.50)
GFR: 74.38 mL/min (ref >60.00)
Glucose, Bld: 316 mg/dL — ABNORMAL HIGH (ref 70–99)
Potassium: 4.1 mEq/L (ref 3.5–5.1)
Sodium: 141 mEq/L (ref 135–145)
Total Bilirubin: 0.8 mg/dL (ref 0.2–1.2)
Total Protein: 6.8 g/dL (ref 6.0–8.3)

## 2021-03-10 LAB — LIPID PANEL
Cholesterol: 112 mg/dL (ref 0–200)
HDL: 38.2 mg/dL — ABNORMAL LOW (ref >39.00)
LDL Cholesterol: 50 mg/dL (ref 0–99)
NonHDL: 74.1
Total CHOL/HDL Ratio: 3
Triglycerides: 119 mg/dL (ref 0.0–149.0)
VLDL: 23.8 mg/dL (ref 0.0–40.0)

## 2021-03-10 LAB — POC URINALSYSI DIPSTICK (AUTOMATED)
Bilirubin, UA: NEGATIVE
Blood, UA: NEGATIVE
Glucose, UA: POSITIVE — AB
Ketones, UA: NEGATIVE
Leukocytes, UA: NEGATIVE
Nitrite, UA: NEGATIVE
Protein, UA: NEGATIVE
Spec Grav, UA: 1.015 (ref 1.010–1.025)
Urobilinogen, UA: 0.2 E.U./dL
pH, UA: 5.5 (ref 5.0–8.0)

## 2021-03-10 LAB — CBC WITH DIFFERENTIAL/PLATELET
Basophils Absolute: 0 10*3/uL (ref 0.0–0.1)
Basophils Relative: 0.7 % (ref 0.0–3.0)
Eosinophils Absolute: 0.2 10*3/uL (ref 0.0–0.7)
Eosinophils Relative: 3.1 % (ref 0.0–5.0)
HCT: 47.6 % (ref 39.0–52.0)
Hemoglobin: 16.1 g/dL (ref 13.0–17.0)
Lymphocytes Relative: 18 % (ref 12.0–46.0)
Lymphs Abs: 1 10*3/uL (ref 0.7–4.0)
MCHC: 33.9 g/dL (ref 30.0–36.0)
MCV: 96.2 fl (ref 78.0–100.0)
Monocytes Absolute: 0.5 10*3/uL (ref 0.1–1.0)
Monocytes Relative: 8.9 % (ref 3.0–12.0)
Neutro Abs: 3.7 10*3/uL (ref 1.4–7.7)
Neutrophils Relative %: 69.3 % (ref 43.0–77.0)
Platelets: 180 10*3/uL (ref 150.0–400.0)
RBC: 4.95 Mil/uL (ref 4.22–5.81)
RDW: 14.1 % (ref 11.5–15.5)
WBC: 5.3 10*3/uL (ref 4.0–10.5)

## 2021-03-10 LAB — HEMOGLOBIN A1C: Hgb A1c MFr Bld: 8.1 % — ABNORMAL HIGH (ref 4.6–6.5)

## 2021-03-10 LAB — VITAMIN B12: Vitamin B-12: 1149 pg/mL — ABNORMAL HIGH (ref 211–911)

## 2021-03-10 NOTE — Addendum Note (Signed)
Addended by: Jacob Moores on: 03/10/2021 01:51 PM   Modules accepted: Orders

## 2021-03-11 ENCOUNTER — Other Ambulatory Visit: Payer: Self-pay

## 2021-03-11 MED ORDER — ROSUVASTATIN CALCIUM 40 MG PO TABS: 40.0000 mg | ORAL_TABLET | Freq: Every day | ORAL | 3 refills | Status: DC

## 2021-03-11 MED ORDER — GLIMEPIRIDE 4 MG PO TABS: ORAL_TABLET | ORAL | 3 refills | Status: DC

## 2021-03-11 MED ORDER — METFORMIN HCL ER 500 MG PO TB24: 1000.0000 mg | ORAL_TABLET | Freq: Two times a day (BID) | ORAL | 3 refills | Status: DC

## 2021-03-11 MED ORDER — EMPAGLIFLOZIN 10 MG PO TABS: 10.0000 mg | ORAL_TABLET | Freq: Every day | ORAL | 11 refills | Status: DC

## 2021-03-13 DIAGNOSIS — J324 Chronic pansinusitis: Secondary | ICD-10-CM | POA: Diagnosis not present

## 2021-03-18 DIAGNOSIS — R509 Fever, unspecified: Secondary | ICD-10-CM | POA: Diagnosis not present

## 2021-03-18 DIAGNOSIS — J069 Acute upper respiratory infection, unspecified: Secondary | ICD-10-CM | POA: Diagnosis not present

## 2021-03-18 DIAGNOSIS — R519 Headache, unspecified: Secondary | ICD-10-CM | POA: Diagnosis not present

## 2021-03-18 DIAGNOSIS — J019 Acute sinusitis, unspecified: Secondary | ICD-10-CM | POA: Diagnosis not present

## 2021-03-18 DIAGNOSIS — R051 Acute cough: Secondary | ICD-10-CM | POA: Diagnosis not present

## 2021-03-21 ENCOUNTER — Other Ambulatory Visit (HOSPITAL_COMMUNITY): Payer: Self-pay | Admitting: Internal Medicine

## 2021-04-06 ENCOUNTER — Telehealth (HOSPITAL_COMMUNITY): Payer: Self-pay | Admitting: *Deleted

## 2021-04-06 NOTE — Telephone Encounter (Signed)
Medication Samples have been provided to the patient.  Drug name: Xarelto      Strength: 20mg         Qty: 4 bottles (28 tablets)    Dosing instructions: Take 1 tablet by mouth daily with supper.   The patient has been instructed regarding the correct time, dose, and frequency of taking this medication, including desired effects and most common side effects.   Adolm Joseph Q 3:40 PM 04/06/2021

## 2021-04-14 ENCOUNTER — Encounter: Payer: Self-pay | Admitting: Internal Medicine

## 2021-04-14 ENCOUNTER — Ambulatory Visit: Payer: Medicare HMO | Admitting: Internal Medicine

## 2021-04-14 ENCOUNTER — Other Ambulatory Visit: Payer: Self-pay

## 2021-04-14 VITALS — BP 100/64 | HR 86 | Ht 72.0 in | Wt 207.2 lb

## 2021-04-14 DIAGNOSIS — I472 Ventricular tachycardia: Secondary | ICD-10-CM | POA: Diagnosis not present

## 2021-04-14 DIAGNOSIS — I5022 Chronic systolic (congestive) heart failure: Secondary | ICD-10-CM

## 2021-04-14 DIAGNOSIS — I482 Chronic atrial fibrillation, unspecified: Secondary | ICD-10-CM

## 2021-04-14 DIAGNOSIS — Z95810 Presence of automatic (implantable) cardiac defibrillator: Secondary | ICD-10-CM | POA: Diagnosis not present

## 2021-04-14 DIAGNOSIS — I4729 Other ventricular tachycardia: Secondary | ICD-10-CM

## 2021-04-14 NOTE — Progress Notes (Signed)
Patient Care Team: Marin Olp, MD as PCP - General (Family Medicine) Sueanne Margarita, MD as PCP - Sleep Medicine (Cardiology) Bensimhon, Shaune Pascal, MD as PCP - Advanced Heart Failure (Cardiology) Deboraha Sprang, MD as Consulting Physician (Cardiology) Magdalen Spatz, NP as Nurse Practitioner (Pulmonary Disease) Pieter Partridge, DO as Consulting Physician (Neurology) Opthamology, [facility] as Consulting Physician (Ophthalmology) Macario Carls, MD as Consulting Physician (Dermatology)   HPI  CC  Ventricular tachycardia and CAD  Carl Wood is a 80 y.o. male s seen in followup for ischemic heart disease with ICD implanted for ventricular tachycardia. He has history of bypass grafting and depressed left ventricular function. He has a history of recurrent ventricular tachycardia--most recently polymorphic in september 21  associated with low-normal magnesium.     He has permanent atrial fibrillation.  Anticoagulated with Xarelto.  12/18 evaluation for chronic headaches showed bilateral small subdurals.  Xarelto was stopped -- subdurals resolved and Xarelto resumed. No bleeding   He underwent CRT upgrade 4/18.--He had an IVCD but anterior precordial notching.   The patient denies chest pain, nocturnal dyspnea, orthopnea or peripheral edema.  There have been no palpitations, lightheadedness or syncope; mild DOE.  Legs hurt and the back starts to hurt and this is the major limitation for his activity    DATE TEST EF   7/16 Echo    20 %   2/17 Cath   LAD disease  Nonviable ant wall   10/17 Echo   20-25%   8/19 Echo  20%   1/21 Echo  20-25%      Date Cr K Hgb Dig  1/21 1.07 4.0 16.2 0.5  4/22  0.98 4.1 16.1 0.5      Thromboembolic risk factors ( age -42 , HTN-1, TIA/CVA-2, DM-1, Vasc disease -1, CHF-1) for a CHADSVASc Score of 7   Past Medical History:  Diagnosis Date  . CAD (coronary artery disease)   . CHF (congestive heart failure) ([place])   . Chronic atrial  fibrillation ([place])   . Colon polyps   . Diabetes mellitus   . Diverticulosis of colon   . DIVERTICULOSIS, COLON 10/16/2006   Qualifier: Diagnosis of  By: Leanne Chang MD, [place]    . Excessive daytime sleepiness 02/19/2016  . Hyperlipidemia   . Hypertension   . MI (mitral incompetence)   . Nephrolithiasis   . NEPHROLITHIASIS 06/26/2008   Qualifier: Diagnosis of  By: Sherlynn Stalls, CMA, [place]    . OSA (obstructive sleep apnea) 02/19/2016   Moderate to severe OSA with an AHI of 25/hr and on CPAP at 8cm H2O  . PE (pulmonary embolism)   . V-tach [facility])     Past Surgical History:  Procedure Laterality Date  . BACK SURGERY    . BASAL CELL CARCINOMA EXCISION     nose  . BIV UPGRADE N/A 03/28/2017   Procedure: BiV Upgrade;  Surgeon: Deboraha Sprang, MD;  Location: [place] [facility];  Service: Cardiovascular;  Laterality: N/A;  . CARDIAC CATHETERIZATION N/A 01/20/2016   Procedure: Right/Left Heart Cath and Coronary Angiography;  Surgeon: Jolaine Artist, MD;  Location: [place] [facility];  Service: Cardiovascular;  Laterality: N/A;  . CARDIAC DEFIBRILLATOR PLACEMENT     medtronic virtuoso  . CHOLECYSTECTOMY    . COLONOSCOPY  12/11/2012   Procedure: COLONOSCOPY;  Surgeon: Inda Castle, MD;  [facility];  Service: Endoscopy;  Laterality: N/A;  . KNEE ARTHROPLASTY  Current Outpatient Medications  Medication Sig Dispense Refill  . ACCU-CHEK AVIVA PLUS test strip USE  STRIP TO CHECK GLUCOSE ONCE DAILY 100 each 0  . Ascorbic Acid (VITAMIN C PO) Take 1 tablet by mouth.    . carvedilol (COREG) 12.5 MG tablet TAKE 1 TABLET BY MOUTH TWICE DAILY WITH A MEAL 180 tablet 3  . digoxin (LANOXIN) 0.25 MG tablet Take 1/2 (one-half) tablet by mouth once daily 45 tablet 2  . empagliflozin (JARDIANCE) 10 MG TABS tablet Take 1 tablet (10 mg total) by mouth daily. 30 tablet 11  . furosemide (LASIX) 20 MG tablet Take 20 mg by mouth as needed.    Marland Kitchen glimepiride (AMARYL) 4 MG tablet TAKE 2 TABLETS BY MOUTH  ONCE DAILY WITH BREAKFAST 180 tablet 3  . metFORMIN (GLUCOPHAGE-XR) 500 MG 24 hr tablet Take 2 tablets (1,000 mg total) by mouth in the morning and at bedtime. 360 tablet 3  . rivaroxaban (XARELTO) 20 MG TABS tablet Take 1 tablet (20 mg total) by mouth daily. 30 tablet 0  . sacubitril-valsartan (ENTRESTO) 49-51 MG Take 1 tablet by mouth 2 (two) times daily. 60 tablet 0  . sodium chloride (OCEAN) 0.65 % SOLN nasal spray Place 2 sprays into both nostrils in the morning and at bedtime.     No current facility-administered medications for this visit.    Allergies  Allergen Reactions  . Penicillins Other (See Comments)    Patient passed out  . Sulfamethoxazole Rash    Review of Systems negative except from HPI and PMH  Physical Exam BP 100/64   Pulse 86   Ht 6' (1.829 m)   Wt 207 lb 3.2 oz (94 kg)   SpO2 98%   BMI 28.10 kg/m  Well developed and well nourished in no acute distress HENT normal Neck supple with JVP-flat Clear Device pocket well healed; without hematoma or erythema.  There is no tethering  Regular rate and rhythm, no  */  murmur Abd-soft with active BS No Clubbing cyanosis   edema Skin-warm and dry A & Oriented  Grossly normal sensory and motor function  ECG atrial fib with upright QRS upright V1 and RS lead 1   Assessment and  Plan  Atrial fibrillation-permanent  IVCD  Ischemic cardiomyopathy prior bypass surgery  ICD-Medtronic-CRT      On Anticoagulation;  No bleeding issues   Afib permanent    Without symptoms of ischemia  Vpacing 97%    With CAD should be on statin and was and his notes report but last on [facility] 7/21   Will call ( tried and got no answer)

## 2021-04-14 NOTE — Patient Instructions (Signed)

## 2021-04-15 ENCOUNTER — Telehealth: Payer: Self-pay

## 2021-04-15 NOTE — Telephone Encounter (Signed)
**Note De-Identified Kristee Angus Obfuscation** The pt had an office visit with Dr Caryl Comes yesterday and expressed a need for assistance with the cost of his Jardiance and Xarelto.  I called the pt and we discussed pt asst for his Xarelto through [employer] pt asst foundation and Jardiance pt asst through [name]. He is interested in applying for both so I gave him [employer]'s phone number to call concerning his Xarelto and [employer]'s phone number to call concerning his Jardiance.  He is aware to ask both assistance foundations questions about their programs and to request that they each mail him an application to his home address.  Once he receives the applications I have advised him to complete his parts on each application, obtain required documents per each foundation, and to bring all to Dr Olin Pia office to drop off and that we will handle the provider page and will fax all in to appropriate  Foundations.  He repeated the phone numbers back to me and verbalized understanding to all all the above. He thanked me for calling him and does have my name and the Dr Olin Pia office phone number to call if he has any questions or needs asst applying.

## 2021-04-16 ENCOUNTER — Other Ambulatory Visit: Payer: Self-pay | Admitting: Family Medicine

## 2021-04-16 IMAGING — DX DG LUMBAR SPINE COMPLETE 4+V
5 series · 5 of 5 positions shown · non-contrast
Comparison: None.

CLINICAL DATA: Low back pain for greater than 6 weeks.

EXAM:
LUMBAR SPINE - COMPLETE 4+ VIEW

[lumbar spine ap]
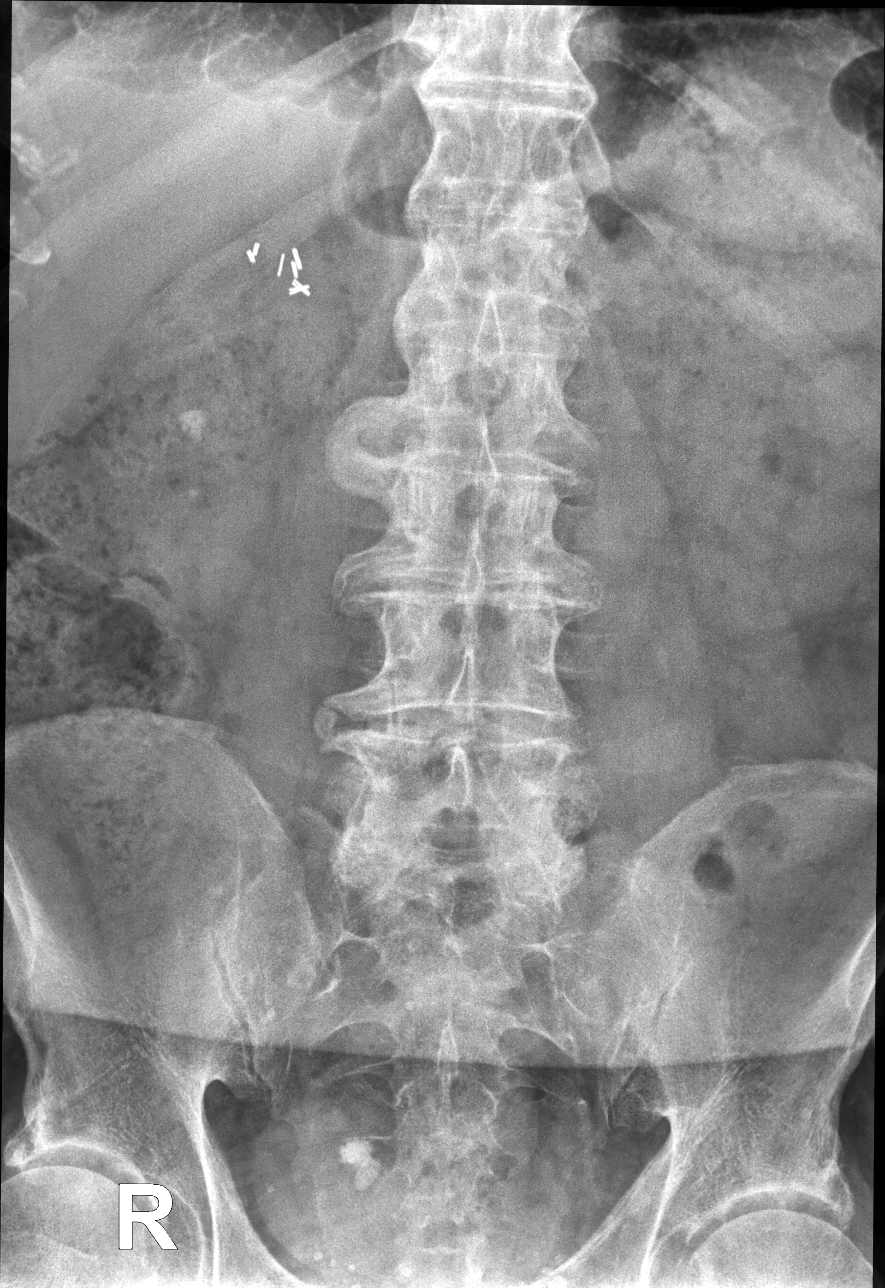

[lumbar spine oblique (1 of 2)]
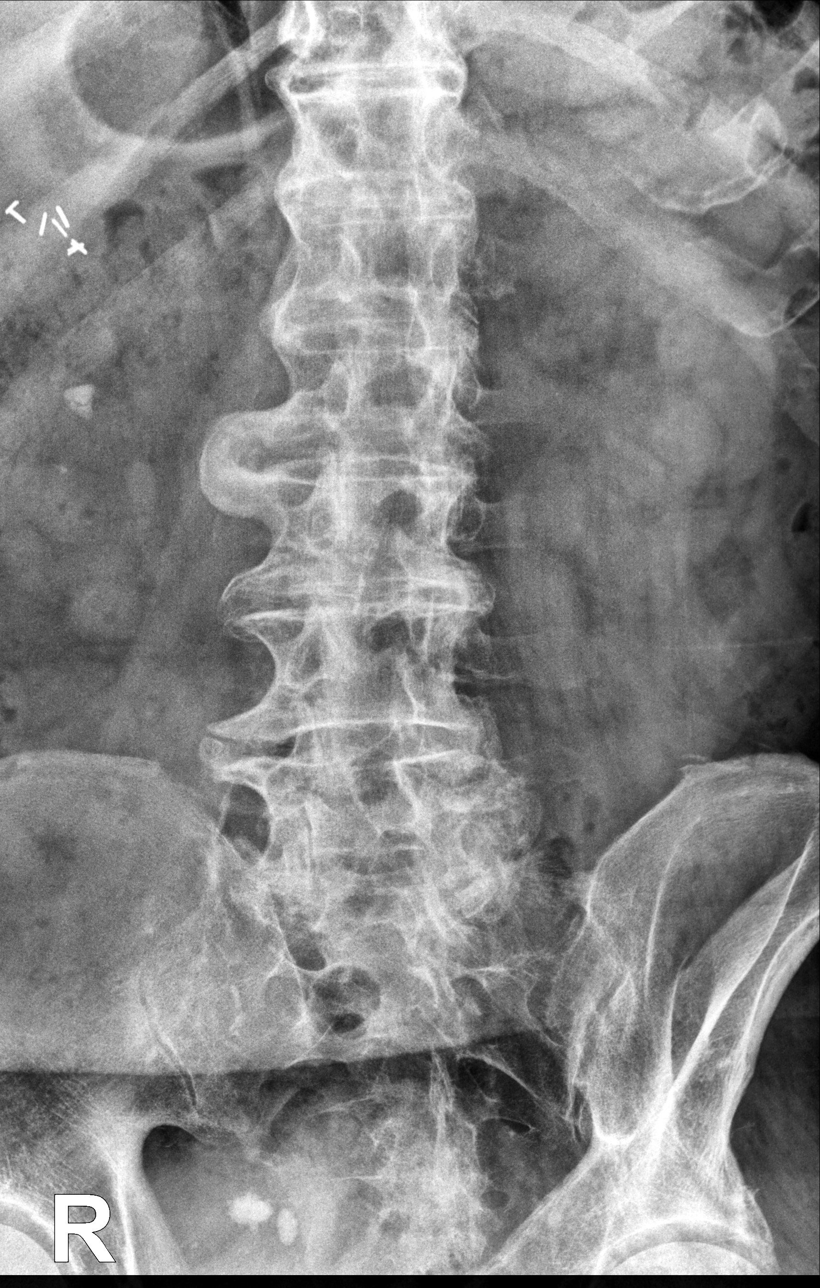

[lumbar spine oblique (2 of 2)]
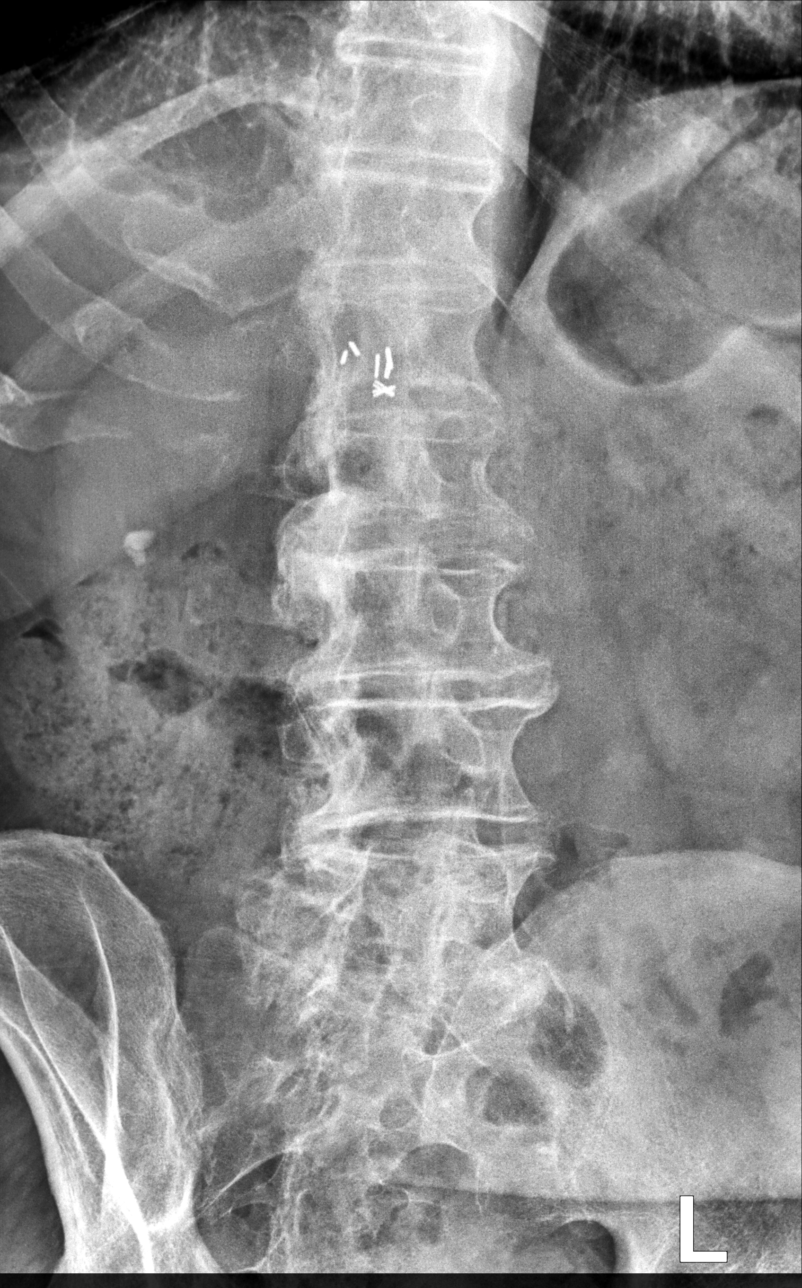

[lumbar spine lat (1 of 2)]
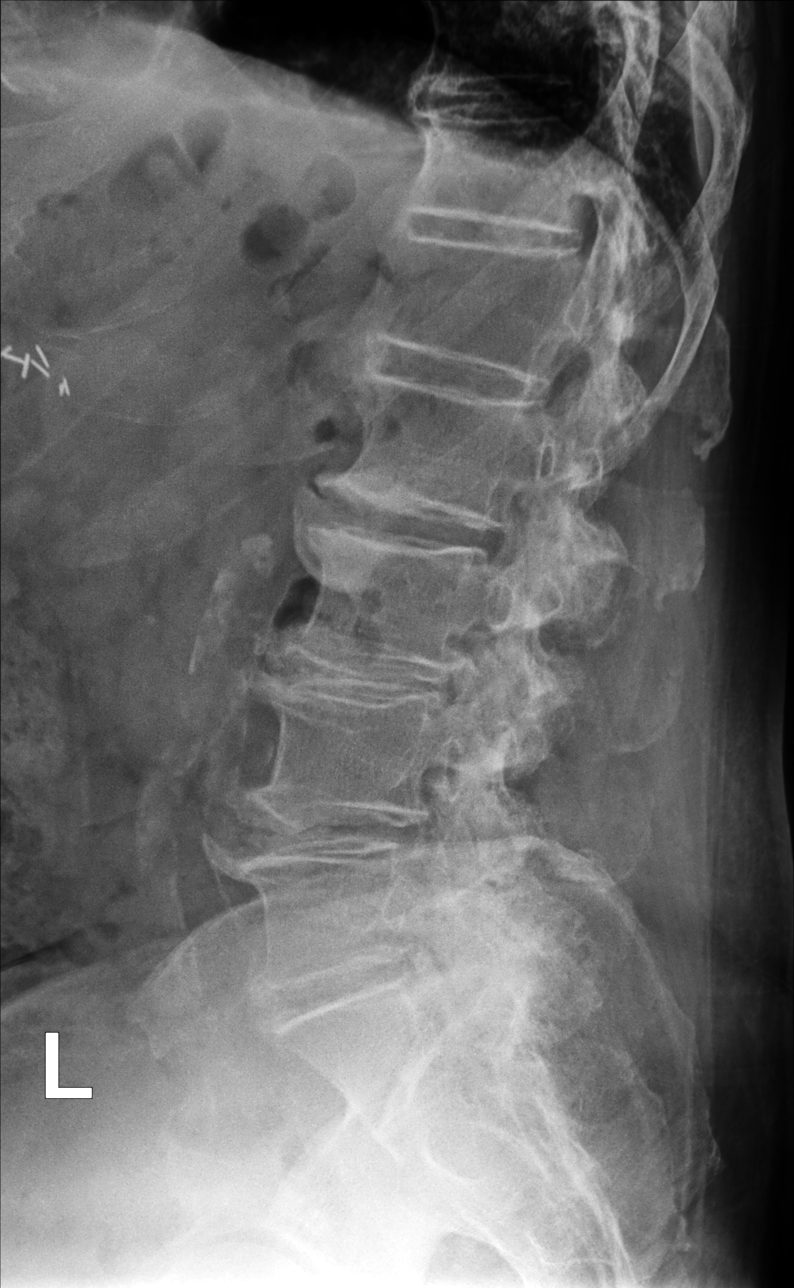

[lumbar spine lat (2 of 2)]
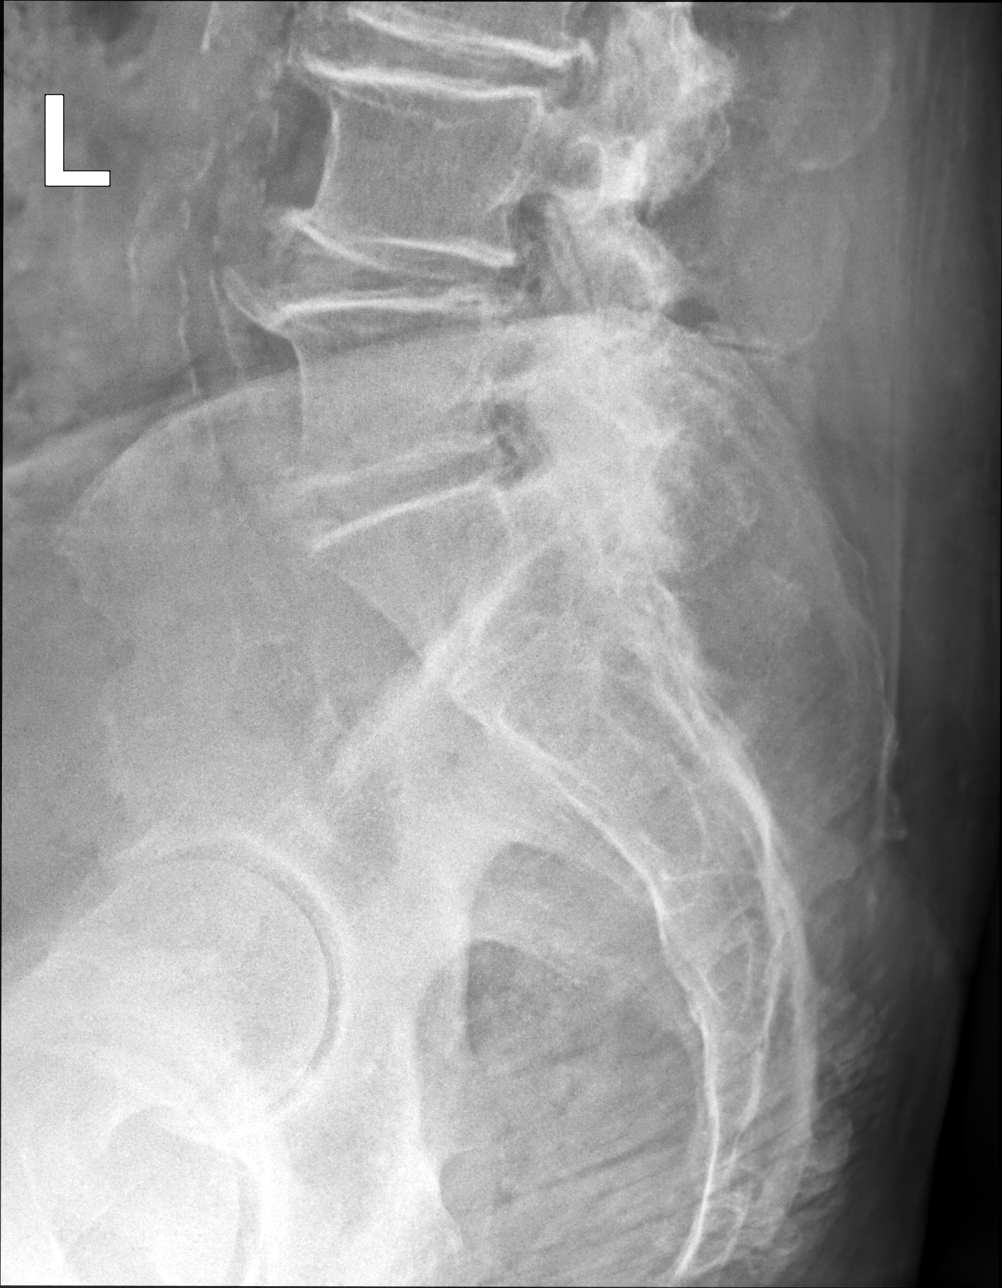

[5 of 5 positions shown; findings below may reference images not displayed]

FINDINGS: There is no evidence of lumbar spine fracture. Vertebral body
alignment is normal. There is moderate to severe degenerative disc
and joint disease in the lumbar spine including bridging
syndesmophytes between the vertebral bodies. Calcifications are seen
in the abdominal aorta. Cholecystectomy clips are noted.
IMPRESSION: Moderate to severe degenerative disc and joint disease in lumbar
spine.

## 2021-04-28 ENCOUNTER — Ambulatory Visit (INDEPENDENT_AMBULATORY_CARE_PROVIDER_SITE_OTHER): Payer: Medicare HMO

## 2021-04-28 DIAGNOSIS — I255 Ischemic cardiomyopathy: Secondary | ICD-10-CM

## 2021-04-28 LAB — CUP PACEART REMOTE DEVICE CHECK
Battery Remaining Longevity: 21 mo
Battery Voltage: 2.93 V
Brady Statistic AP VP Percent: 0 %
Brady Statistic AP VS Percent: 0 %
Brady Statistic AS VP Percent: 0 %
Brady Statistic AS VS Percent: 0 %
Brady Statistic RA Percent Paced: 0 %
Brady Statistic RV Percent Paced: 98.33 %
Date Time Interrogation Session: 20220524001804
HighPow Impedance: 73 Ohm
Implantable Lead Implant Date: 20010723
Implantable Lead Implant Date: 20180423
Implantable Lead Location: 753858
Implantable Lead Location: 753860
Implantable Lead Model: 6943
Implantable Pulse Generator Implant Date: 20180423
Lead Channel Impedance Value: 1007 Ohm
Lead Channel Impedance Value: 266 Ohm
Lead Channel Impedance Value: 266 Ohm
Lead Channel Impedance Value: 266 Ohm
Lead Channel Impedance Value: 279.525
Lead Channel Impedance Value: 279.525
Lead Channel Impedance Value: 285 Ohm
Lead Channel Impedance Value: 4047 Ohm
Lead Channel Impedance Value: 418 Ohm
Lead Channel Impedance Value: 532 Ohm
Lead Channel Impedance Value: 532 Ohm
Lead Channel Impedance Value: 532 Ohm
Lead Channel Impedance Value: 589 Ohm
Lead Channel Impedance Value: 931 Ohm
Lead Channel Impedance Value: 931 Ohm
Lead Channel Impedance Value: 950 Ohm
Lead Channel Impedance Value: 950 Ohm
Lead Channel Impedance Value: 988 Ohm
Lead Channel Pacing Threshold Amplitude: 1.5 V
Lead Channel Pacing Threshold Amplitude: 1.75 V
Lead Channel Pacing Threshold Pulse Width: 0.4 ms
Lead Channel Pacing Threshold Pulse Width: 1 ms
Lead Channel Sensing Intrinsic Amplitude: 13.5 mV
Lead Channel Sensing Intrinsic Amplitude: 13.5 mV
Lead Channel Setting Pacing Amplitude: 2.25 V
Lead Channel Setting Pacing Amplitude: 3 V
Lead Channel Setting Pacing Pulse Width: 0.4 ms
Lead Channel Setting Pacing Pulse Width: 1 ms
Lead Channel Setting Sensing Sensitivity: 0.3 mV

## 2021-05-01 NOTE — Telephone Encounter (Signed)
**Note De-Identified Taina Landry Obfuscation** The pt left his completed [employer] application for Xarelto at the office. I have completed the provider page of the application and emailed all to Dr Aquilla Hacker nurse so she can obtain his signature and to fax all to [employer] at the fax number written on the cover letter included or to leave in the "to be faxed box" in Medical Records to be faxed.

## 2021-05-07 NOTE — Telephone Encounter (Addendum)
Application was printed, signed and placed with medical records to be faxed and scanned on 05/01/2021.

## 2021-05-18 ENCOUNTER — Telehealth: Payer: Self-pay

## 2021-05-18 ENCOUNTER — Other Ambulatory Visit: Payer: Self-pay | Admitting: Family Medicine

## 2021-05-18 NOTE — Telephone Encounter (Signed)
**Note De-Identified Anaaya Fuster Obfuscation** The pts completed [employer] Pt [employer] application was left at the office. I have completed the provider page of the application and have e-mailed all to Dr Olin Pia nurse so she can obtain his signature, date it, and to fax to [name] at the fax number written on the cover letter included or to leave in "to be faxed box" in Medical Records to be faxed.

## 2021-05-19 ENCOUNTER — Other Ambulatory Visit: Payer: Self-pay

## 2021-05-19 DIAGNOSIS — M9903 Segmental and somatic dysfunction of lumbar region: Secondary | ICD-10-CM | POA: Diagnosis not present

## 2021-05-19 DIAGNOSIS — M48062 Spinal stenosis, lumbar region with neurogenic claudication: Secondary | ICD-10-CM | POA: Diagnosis not present

## 2021-05-19 DIAGNOSIS — M9902 Segmental and somatic dysfunction of thoracic region: Secondary | ICD-10-CM | POA: Diagnosis not present

## 2021-05-19 DIAGNOSIS — R262 Difficulty in walking, not elsewhere classified: Secondary | ICD-10-CM | POA: Diagnosis not present

## 2021-05-19 DIAGNOSIS — M9906 Segmental and somatic dysfunction of lower extremity: Secondary | ICD-10-CM | POA: Diagnosis not present

## 2021-05-19 DIAGNOSIS — M47812 Spondylosis without myelopathy or radiculopathy, cervical region: Secondary | ICD-10-CM | POA: Diagnosis not present

## 2021-05-19 DIAGNOSIS — M9905 Segmental and somatic dysfunction of pelvic region: Secondary | ICD-10-CM | POA: Diagnosis not present

## 2021-05-19 DIAGNOSIS — M9901 Segmental and somatic dysfunction of cervical region: Secondary | ICD-10-CM | POA: Diagnosis not present

## 2021-05-19 NOTE — Telephone Encounter (Signed)
Application signed and dated.  Placed with medical records for faxing.

## 2021-05-20 ENCOUNTER — Ambulatory Visit
Admission: RE | Admit: 2021-05-20 | Discharge: 2021-05-20 | Disposition: A | Discharge status: Home / Self Care | Payer: Medicare HMO | Source: Ambulatory Visit | Attending: Sports Medicine | Admitting: Sports Medicine

## 2021-05-20 ENCOUNTER — Other Ambulatory Visit: Payer: Self-pay

## 2021-05-20 ENCOUNTER — Other Ambulatory Visit: Payer: Self-pay | Admitting: Sports Medicine

## 2021-05-20 DIAGNOSIS — M545 Low back pain, unspecified: Secondary | ICD-10-CM | POA: Diagnosis not present

## 2021-05-20 DIAGNOSIS — M47812 Spondylosis without myelopathy or radiculopathy, cervical region: Secondary | ICD-10-CM | POA: Diagnosis not present

## 2021-05-20 DIAGNOSIS — Z981 Arthrodesis status: Secondary | ICD-10-CM | POA: Diagnosis not present

## 2021-05-20 DIAGNOSIS — M2578 Osteophyte, vertebrae: Secondary | ICD-10-CM | POA: Diagnosis not present

## 2021-05-20 DIAGNOSIS — M542 Cervicalgia: Secondary | ICD-10-CM

## 2021-05-20 DIAGNOSIS — M4802 Spinal stenosis, cervical region: Secondary | ICD-10-CM | POA: Diagnosis not present

## 2021-05-20 NOTE — Progress Notes (Signed)
Remote ICD transmission.   

## 2021-06-01 ENCOUNTER — Other Ambulatory Visit: Payer: Self-pay | Admitting: Family Medicine

## 2021-06-01 ENCOUNTER — Telehealth: Payer: Self-pay | Admitting: Internal Medicine

## 2021-06-01 ENCOUNTER — Other Ambulatory Visit (HOSPITAL_COMMUNITY): Payer: Self-pay

## 2021-06-01 ENCOUNTER — Telehealth (HOSPITAL_COMMUNITY): Payer: Self-pay | Admitting: Pharmacy Technician

## 2021-06-01 NOTE — Telephone Encounter (Signed)
**Note De-Identified Carl Wood Obfuscation** The pt states that he called [employer] Emery and [employer] to check on his application for Xarelto and was advised that they have not received an application from him. Per the pts chart we faxed his application to them on 90/12.  I will forward this note to Dr Aquilla Hacker nurse to ask if she still has this application and if not I can re-email it to her.

## 2021-06-01 NOTE — Telephone Encounter (Signed)
Advanced Heart Failure Patient Advocate Encounter  BIN [id number] PCN [id number] Group [id number] ID [id number]  $[id number]  06/01/21-05/31/22  Called and left the patient message, can provide samples of Xarelto as requested. Sent Philicia (CMA) a request for samples. Called and provided billing information to the patient's pharmacy.   Charlann Boxer, CPhT

## 2021-06-02 NOTE — Telephone Encounter (Signed)
Printed, signed and dated.  Placed again with medical records for faxing.

## 2021-06-03 ENCOUNTER — Telehealth: Payer: Self-pay | Admitting: Family Medicine

## 2021-06-03 NOTE — Telephone Encounter (Signed)
Left message for patient to schedule Annual Wellness Visit.  Please schedule with Nurse Health Advisor Charlott Rakes, RN at [facility].

## 2021-06-04 NOTE — Telephone Encounter (Signed)
**Note De-Identified Lylla Eifler Obfuscation** No answer on the pts home and cell phones. I was able to leave a message on his cell phone VM asking him to call Jeani Hawking at Dr Olin Pia office at [facility] at [phone]

## 2021-06-04 NOTE — Telephone Encounter (Signed)
Received letter from [facility] cares that patient may be eligible for low income subsidy. Patient should apply for low income subsidy though the [employer] office. If he is denied, he can submit his denial letter to [facility] cares and they will reconsider his application.

## 2021-06-09 DIAGNOSIS — M48062 Spinal stenosis, lumbar region with neurogenic claudication: Secondary | ICD-10-CM | POA: Diagnosis not present

## 2021-06-09 DIAGNOSIS — M47812 Spondylosis without myelopathy or radiculopathy, cervical region: Secondary | ICD-10-CM | POA: Diagnosis not present

## 2021-06-09 DIAGNOSIS — R262 Difficulty in walking, not elsewhere classified: Secondary | ICD-10-CM | POA: Diagnosis not present

## 2021-06-15 ENCOUNTER — Other Ambulatory Visit: Payer: Self-pay

## 2021-06-15 ENCOUNTER — Encounter: Payer: Self-pay | Admitting: Physical Therapy

## 2021-06-15 ENCOUNTER — Ambulatory Visit (INDEPENDENT_AMBULATORY_CARE_PROVIDER_SITE_OTHER): Payer: Medicare HMO | Admitting: Physical Therapy

## 2021-06-15 DIAGNOSIS — M6281 Muscle weakness (generalized): Secondary | ICD-10-CM

## 2021-06-15 DIAGNOSIS — M545 Low back pain, unspecified: Secondary | ICD-10-CM

## 2021-06-15 DIAGNOSIS — G8929 Other chronic pain: Secondary | ICD-10-CM

## 2021-06-15 DIAGNOSIS — M542 Cervicalgia: Secondary | ICD-10-CM

## 2021-06-15 NOTE — Patient Instructions (Signed)
Access Code: [email or web address] URL: [email or web address] Date: 06/15/2021 Prepared by: Lyndee Hensen  Exercises Supine Posterior Pelvic Tilt - 2 x daily - 2 sets - 10 reps Supine Single Knee to Chest Stretch - 2 x daily - 3 reps - 30 hold Seated Hamstring Stretch - 2 x daily - 3 reps - 30 hold

## 2021-06-17 ENCOUNTER — Other Ambulatory Visit: Payer: Self-pay

## 2021-06-17 ENCOUNTER — Encounter: Payer: Self-pay | Admitting: Physical Therapy

## 2021-06-17 ENCOUNTER — Ambulatory Visit (INDEPENDENT_AMBULATORY_CARE_PROVIDER_SITE_OTHER): Payer: Medicare HMO | Admitting: Physical Therapy

## 2021-06-17 DIAGNOSIS — M6281 Muscle weakness (generalized): Secondary | ICD-10-CM | POA: Diagnosis not present

## 2021-06-17 DIAGNOSIS — G8929 Other chronic pain: Secondary | ICD-10-CM | POA: Diagnosis not present

## 2021-06-17 DIAGNOSIS — M542 Cervicalgia: Secondary | ICD-10-CM | POA: Diagnosis not present

## 2021-06-17 DIAGNOSIS — M545 Low back pain, unspecified: Secondary | ICD-10-CM | POA: Diagnosis not present

## 2021-06-17 NOTE — Therapy (Signed)
[facility] [address] [place], [place], [address] Phone: [phone]   Fax:  [phone]  Physical Therapy Treatment  Patient Details  Name: Carl Wood MRN: [id number] Date of Birth: November 17, 1941 Referring Provider (PT): Teresa Coombs   Encounter Date: 06/17/2021   PT End of Session - 06/17/21 2251     Visit Number 2    Number of Visits 12    Date for PT Re-Evaluation 07/27/21    Authorization Type [employer]    PT Start Time 1215    PT Stop Time 1300    PT Time Calculation (min) 45 min    Activity Tolerance Patient tolerated treatment well    Behavior During Therapy [facility] for tasks assessed/performed             Past Medical History:  Diagnosis Date   CAD (coronary artery disease)    CHF (congestive heart failure) (HCC)    Chronic atrial fibrillation ([place])    Colon polyps    Diabetes mellitus    Diverticulosis of colon    DIVERTICULOSIS, COLON 10/16/2006   Qualifier: Diagnosis of  By: Leanne Chang MD, Bruce     Excessive daytime sleepiness 02/19/2016   Hyperlipidemia    Hypertension    MI (mitral incompetence)    Nephrolithiasis    NEPHROLITHIASIS 06/26/2008   Qualifier: Diagnosis of  By: Sherlynn Stalls, CMA, Cindy     OSA (obstructive sleep apnea) 02/19/2016   Moderate to severe OSA with an AHI of 25/hr and on CPAP at 8cm H2O   PE (pulmonary embolism)    V-tach [facility])     Past Surgical History:  Procedure Laterality Date   BACK SURGERY     BASAL CELL CARCINOMA EXCISION     nose   BIV UPGRADE N/A 03/28/2017   Procedure: BiV Upgrade;  Surgeon: Deboraha Sprang, MD;  Location: [facility];  Service: Cardiovascular;  Laterality: N/A;   CARDIAC CATHETERIZATION N/A 01/20/2016   Procedure: Right/Left Heart Cath and Coronary Angiography;  Surgeon: Jolaine Artist, MD;  Location: [facility];  Service: Cardiovascular;  Laterality: N/A;   CARDIAC DEFIBRILLATOR PLACEMENT     medtronic virtuoso   CHOLECYSTECTOMY      COLONOSCOPY  12/11/2012   Procedure: COLONOSCOPY;  Surgeon: Inda Castle, MD;  Location: [facility];  Service: Endoscopy;  Laterality: N/A;   KNEE ARTHROPLASTY      There were no vitals filed for this visit.   Subjective Assessment - 06/17/21 2250     Subjective Pt states continued pain. Is sore all over from mowing lawn yesterday.    Currently in Pain? Yes    Pain Score 5     Pain Location Back    Pain Score 8    Pain Location Neck    Pain Orientation Right;Left                               [facility] Adult PT Treatment/Exercise - 06/17/21 2249       Exercises   Exercises Lumbar;Neck      Neck Exercises: Seated   Cervical Rotation 10 reps;Both    Other Seated Exercise thoracic rotation x10;      Lumbar Exercises: Stretches   Active Hamstring Stretch 3 reps;30 seconds    Active Hamstring Stretch Limitations seated    Single Knee to Chest Stretch 3 reps;30 seconds;Right;Left    Lower Trunk Rotation 5 reps;10 seconds  Pelvic Tilt 20 reps      Lumbar Exercises: Standing   Other Standing Lumbar Exercises March x 20;      Lumbar Exercises: Seated   Sit to Stand 5 reps    Sit to Stand Limitations 5 x 2 higher mat table, no UE support      Lumbar Exercises: Supine   Bent Knee Raise 15 reps    Straight Leg Raise 10 reps    Straight Leg Raises Limitations bil                      PT Short Term Goals - 06/17/21 2228       PT SHORT TERM GOAL #1   Title Pt to be independent with initial HEP    Time 2    Period Weeks    Status New    Target Date 06/29/21               PT Long Term Goals - 06/17/21 2229       PT LONG TERM GOAL #1   Title Pt to be independent wtih final HEP    Time 6    Period Weeks    Status New    Target Date 07/27/21      PT LONG TERM GOAL #2   Title Pt to demo ability for pain free cervical rotation, to improve ability for ADLs and driving    Time 6    Period Weeks    Status New    Target Date  07/27/21      PT LONG TERM GOAL #3   Title Pt to report decreased pain in back with standing activity , to 0-2/10    Time 6    Period Weeks    Target Date 07/27/21      PT LONG TERM GOAL #4   Title Pt to demo increased functional strength, to perform sit to stand transfer without UE support, and ability for recipricol stair climbing with 1 UE support.    Time 6    Period Weeks    Status New    Target Date 07/27/21                   Plan - 06/17/21 2252     Clinical Impression Statement Pt able to progress ther ex for mobility and strengthening today wihtout increased pain. Pt with very limited ability for ROM in c-spine due to stiffness. Able to improve mehcanics for sit to stand after education and practice today on higher mat table. Plan to continue ROM for neck and back , and progress strengthening as tolerated.    Examination-Activity Limitations Squat;Stairs;Stand;Carry;Transfers;Lift;Locomotion Level    Examination-Participation Restrictions Community Activity;Shop;Driving;Yard Work;Meal Prep    Stability/Clinical Decision Making Stable/Uncomplicated    Rehab Potential Good    PT Frequency 2x / week    PT Duration 6 weeks    PT Treatment/Interventions ADLs/Self Care Home Management;Cryotherapy;Therapeutic activities;Moist Heat;Traction;Balance training;Therapeutic exercise;Functional mobility training;Stair training;Gait training;DME Instruction;Neuromuscular re-education;Cognitive remediation;Patient/family education;Manual techniques;Vasopneumatic Device;Taping;Dry needling;Passive range of motion;Spinal Manipulations;Joint Manipulations    Consulted and Agree with Plan of Care Patient             Patient will benefit from skilled therapeutic intervention in order to improve the following deficits and impairments:  Decreased endurance, Decreased activity tolerance, Decreased strength, Pain, Decreased balance, Decreased mobility, Difficulty walking, Increased muscle  spasms, Decreased range of motion, Impaired flexibility, Hypomobility  Visit Diagnosis: Chronic bilateral low back pain  without sciatica  Cervicalgia  Muscle weakness (generalized)     Problem List Patient Active Problem List   Diagnosis Date Noted   COPD (chronic obstructive pulmonary disease) ([place]) 12/26/2018   Osteoarthritis of right knee 09/22/2018   History of subdural hematoma 11/21/2017   Frontal headache 10/06/2017   Rhinitis 08/22/2017   Dyspnea on exertion 08/22/2017   Ischemic cardiomyopathy 03/28/2017   Chronic atrial fibrillation (HCC)    OSA (obstructive sleep apnea) 79/43/2761   Chronic systolic heart failure ([place]) 08/06/2015   LBBB (left bundle branch block) 08/06/2015   Carotid artery stenosis 03/17/2015   Former smoker 11/15/2014   Actinic keratosis 11/15/2014   History of cardioembolic cerebrovascular accident (CVA) 12/19/2012   Benign neoplasm of colon 12/11/2012   Implantable cardioverter-defibrillator (ICD) in situ 02/17/2012    ventricular tachycardia-non sustained  02/17/2012   History of skin cancer 07/05/2007   Diabetes mellitus type II, controlled ([place]) 10/16/2006   Hyperlipidemia associated with type 2 diabetes mellitus ([place]) 10/16/2006   Hypertension associated with diabetes ([place]) 10/16/2006   CAD (coronary artery disease) 10/16/2006   Chronic pulmonary embolism ([place]) 10/16/2006    Lyndee Hensen, PT, DPT 10:54 PM  06/17/21    [facility] [address] [place], [place], [address] Phone: [phone]   Fax:  [phone]  Name: Carl Wood MRN: [id number] Date of Birth: 01/16/1941

## 2021-06-17 NOTE — Therapy (Signed)
[facility] [address] [place], [place], [address] Phone: [phone]   Fax:  [phone]  Physical Therapy Evaluation  Patient Details  Name: Carl Wood MRN: [id number] Date of Birth: [birth year] Referring Provider (PT): Teresa Coombs   Encounter Date: 06/15/2021   PT End of Session - 06/17/21 2224     Visit Number 1    Number of Visits 12    Date for PT Re-Evaluation 07/27/21    Authorization Type [employer]    PT Start Time 1430    PT Stop Time 1510    PT Time Calculation (min) 40 min    Activity Tolerance Patient tolerated treatment well    Behavior During Therapy [facility] for tasks assessed/performed             Past Medical History:  Diagnosis Date   CAD (coronary artery disease)    CHF (congestive heart failure) (HCC)    Chronic atrial fibrillation ([place])    Colon polyps    Diabetes mellitus    Diverticulosis of colon    DIVERTICULOSIS, COLON 10/16/2006   Qualifier: Diagnosis of  By: Leanne Chang MD, Bruce     Excessive daytime sleepiness 02/19/2016   Hyperlipidemia    Hypertension    MI (mitral incompetence)    Nephrolithiasis    NEPHROLITHIASIS 06/26/2008   Qualifier: Diagnosis of  By: Sherlynn Stalls, CMA, Cindy     OSA (obstructive sleep apnea) 02/19/2016   Moderate to severe OSA with an AHI of 25/hr and on CPAP at 8cm H2O   PE (pulmonary embolism)    V-tach [facility])     Past Surgical History:  Procedure Laterality Date   BACK SURGERY     BASAL CELL CARCINOMA EXCISION     nose   BIV UPGRADE N/A 03/28/2017   Procedure: BiV Upgrade;  Surgeon: Deboraha Sprang, MD;  Location: [facility];  Service: Cardiovascular;  Laterality: N/A;   CARDIAC CATHETERIZATION N/A 01/20/2016   Procedure: Right/Left Heart Cath and Coronary Angiography;  Surgeon: Jolaine Artist, MD;  Location: [facility];  Service: Cardiovascular;  Laterality: N/A;   CARDIAC DEFIBRILLATOR PLACEMENT     medtronic virtuoso   CHOLECYSTECTOMY      COLONOSCOPY  12/11/2012   Procedure: COLONOSCOPY;  Surgeon: Inda Castle, MD;  Location: [facility];  Service: Endoscopy;  Laterality: N/A;   KNEE ARTHROPLASTY      There were no vitals filed for this visit.    Subjective Assessment - 06/17/21 2220     Subjective Pt states back pain, neck pain, and weakness in legs. States Cervical spine very stiff and is having a hard time turning head with driving. States pain in back with standing for even just a few minutes. He requires support from cart at grocery store. Does not use AD.  Also feels legs are weak. He has Stairs, but doesnt use, bedroom on 1st level. 4-5 steps to enter garage.    Limitations Standing;Walking;House hold activities    Patient Stated Goals decreased    Currently in Pain? Yes    Pain Score 5     Pain Location Back    Pain Orientation Right;Left    Pain Descriptors / Indicators Aching    Pain Type Chronic pain    Pain Onset More than a month ago    Pain Frequency Intermittent    Aggravating Factors  standing activity    Pain Relieving Factors sitting    Pain Score 8  Pain Location Neck    Pain Orientation Right;Left    Pain Descriptors / Indicators Aching    Pain Type Acute pain    Pain Onset More than a month ago    Pain Frequency Intermittent    Aggravating Factors  with movment, driving    Pain Relieving Factors neutral head position                [facility] PT Assessment - 06/17/21 0001       Assessment   Medical Diagnosis Neck pain, back pain, weakness    Referring Provider (PT) Teresa Coombs    Prior Therapy no      Precautions   Precautions ICD/Pacemaker    Precaution Comments defibrillator      Balance Screen   Has the patient fallen in the past 6 months No      Prior Function   Level of Independence Independent      Cognition   Overall Cognitive Status Within Functional Limits for tasks assessed      ROM / Strength   AROM / PROM / Strength AROM;Strength      AROM   Overall  AROM Comments Hips: WFL, Knees: WFL    AROM Assessment Site Cervical    Cervical Flexion wfl    Cervical Extension 10    Cervical - Right Rotation 20    Cervical - Left Rotation 15      Strength   Overall Strength Comments UE: 4+/5, hips: 4-/5, Knee: 4/5      Palpation   Palpation comment Hypomobile c-spine and l-spine,      Special Tests   Other special tests negative radicular pain      Transfers   Comments requires moderate use of hands from regular height chair                        Objective measurements completed on examination: See above findings.       [facility] Adult PT Treatment/Exercise - 06/17/21 2246       Lumbar Exercises: Stretches   Active Hamstring Stretch 3 reps;30 seconds    Active Hamstring Stretch Limitations seated    Single Knee to Chest Stretch 3 reps;30 seconds;Right;Left    Pelvic Tilt 20 reps                    PT Education - 06/17/21 2224     Education Details PT POC, Exam findings, HEP    Person(s) Educated Patient    Methods Explanation;Demonstration;Tactile cues;Verbal cues;Handout    Comprehension Verbalized understanding;Returned demonstration;Verbal cues required;Tactile cues required;Need further instruction              PT Short Term Goals - 06/17/21 2228       PT SHORT TERM GOAL #1   Title Pt to be independent with initial HEP    Time 2    Period Weeks    Status New    Target Date 06/29/21               PT Long Term Goals - 06/17/21 2229       PT LONG TERM GOAL #1   Title Pt to be independent wtih final HEP    Time 6    Period Weeks    Status New    Target Date 07/27/21      PT LONG TERM GOAL #2   Title Pt to demo ability for pain free cervical rotation,  to improve ability for ADLs and driving    Time 6    Period Weeks    Status New    Target Date 07/27/21      PT LONG TERM GOAL #3   Title Pt to report decreased pain in back with standing activity , to 0-2/10    Time 6     Period Weeks    Target Date 07/27/21      PT LONG TERM GOAL #4   Title Pt to demo increased functional strength, to perform sit to stand transfer without UE support, and ability for recipricol stair climbing with 1 UE support.    Time 6    Period Weeks    Status New    Target Date 07/27/21                    Plan - 06/17/21 2235     Clinical Impression Statement Pt presents with primary complaint of increased pain in neck and back. Pt with significant stiffness and loss of ROM in c-spine. He has no pain in a smal/restricted ROM, but increased pain when going beyond that. Pt with minimal pain to palpate lumbar spine today, but has increased pain with standing and walking, requiring him to take frequent rest breaks and use support. Pt with decreased LE strength, with increased need for UE assist with transfers. He has avoided stairs recently due to weakness and decreased confidence. pt to benefit from skilled PT to improve deficits and pain.    Examination-Activity Limitations Squat;Stairs;Stand;Carry;Transfers;Lift;Locomotion Level    Examination-Participation Restrictions Community Activity;Shop;Driving;Yard Work;Meal Prep    Stability/Clinical Decision Making Stable/Uncomplicated    Clinical Decision Making Low    Rehab Potential Good    PT Frequency 2x / week    PT Duration 6 weeks    PT Treatment/Interventions ADLs/Self Care Home Management;Cryotherapy;Therapeutic activities;Moist Heat;Traction;Balance training;Therapeutic exercise;Functional mobility training;Stair training;Gait training;DME Instruction;Neuromuscular re-education;Cognitive remediation;Patient/family education;Manual techniques;Vasopneumatic Device;Taping;Dry needling;Passive range of motion;Spinal Manipulations;Joint Manipulations    Consulted and Agree with Plan of Care Patient             Patient will benefit from skilled therapeutic intervention in order to improve the following deficits and  impairments:  Decreased endurance, Decreased activity tolerance, Decreased strength, Pain, Decreased balance, Decreased mobility, Difficulty walking, Increased muscle spasms, Decreased range of motion, Impaired flexibility, Hypomobility  Visit Diagnosis: Chronic bilateral low back pain without sciatica  Cervicalgia  Muscle weakness (generalized)     Problem List Patient Active Problem List   Diagnosis Date Noted   COPD (chronic obstructive pulmonary disease) ([place]) 12/26/2018   Osteoarthritis of right knee 09/22/2018   History of subdural hematoma 11/21/2017   Frontal headache 10/06/2017   Rhinitis 08/22/2017   Dyspnea on exertion 08/22/2017   Ischemic cardiomyopathy 03/28/2017   Chronic atrial fibrillation (HCC)    OSA (obstructive sleep apnea) 95/08/3266   Chronic systolic heart failure ([place]) 08/06/2015   LBBB (left bundle branch block) 08/06/2015   Carotid artery stenosis 03/17/2015   Former smoker 11/15/2014   Actinic keratosis 11/15/2014   History of cardioembolic cerebrovascular accident (CVA) 12/19/2012   Benign neoplasm of colon 12/11/2012   Implantable cardioverter-defibrillator (ICD) in situ 02/17/2012    ventricular tachycardia-non sustained  02/17/2012   History of skin cancer 07/05/2007   Diabetes mellitus type II, controlled ([place]) 10/16/2006   Hyperlipidemia associated with type 2 diabetes mellitus ([place]) 10/16/2006   Hypertension associated with diabetes ([place]) 10/16/2006   CAD (coronary artery disease) 10/16/2006  Chronic pulmonary embolism ([place]) 10/16/2006   Lyndee Hensen, PT, DPT 10:47 PM  06/17/21    [facility] [place], [place], [address] Phone: [phone]   Fax:  [phone]  Name: Carl Wood MRN: [id number] Date of Birth: 01-06-1941

## 2021-06-22 ENCOUNTER — Other Ambulatory Visit: Payer: Self-pay

## 2021-06-22 ENCOUNTER — Encounter: Payer: Self-pay | Admitting: Physical Therapy

## 2021-06-22 ENCOUNTER — Ambulatory Visit (INDEPENDENT_AMBULATORY_CARE_PROVIDER_SITE_OTHER): Payer: Medicare HMO | Admitting: Physical Therapy

## 2021-06-22 DIAGNOSIS — G8929 Other chronic pain: Secondary | ICD-10-CM | POA: Diagnosis not present

## 2021-06-22 DIAGNOSIS — M542 Cervicalgia: Secondary | ICD-10-CM | POA: Diagnosis not present

## 2021-06-22 DIAGNOSIS — M6281 Muscle weakness (generalized): Secondary | ICD-10-CM | POA: Diagnosis not present

## 2021-06-22 DIAGNOSIS — M545 Low back pain, unspecified: Secondary | ICD-10-CM | POA: Diagnosis not present

## 2021-06-22 NOTE — Therapy (Signed)
[facility] [address] [place], [place], [address] Phone: [phone]   Fax:  [phone]  Physical Therapy Treatment  Patient Details  Name: ROSE HIPPLER MRN: [id number] Date of Birth: 1941-02-24 Referring Provider (PT): Teresa Coombs   Encounter Date: 06/22/2021   PT End of Session - 06/22/21 1401     Visit Number 3    Number of Visits 12    Date for PT Re-Evaluation 07/27/21    Authorization Type [employer]    PT Start Time 1350    PT Stop Time 1430    PT Time Calculation (min) 40 min    Activity Tolerance Patient tolerated treatment well    Behavior During Therapy [facility] for tasks assessed/performed             Past Medical History:  Diagnosis Date   CAD (coronary artery disease)    CHF (congestive heart failure) (HCC)    Chronic atrial fibrillation ([place])    Colon polyps    Diabetes mellitus    Diverticulosis of colon    DIVERTICULOSIS, COLON 10/16/2006   Qualifier: Diagnosis of  By: Leanne Chang MD, Bruce     Excessive daytime sleepiness 02/19/2016   Hyperlipidemia    Hypertension    MI (mitral incompetence)    Nephrolithiasis    NEPHROLITHIASIS 06/26/2008   Qualifier: Diagnosis of  By: Sherlynn Stalls, CMA, Cindy     OSA (obstructive sleep apnea) 02/19/2016   Moderate to severe OSA with an AHI of 25/hr and on CPAP at 8cm H2O   PE (pulmonary embolism)    V-tach [facility])     Past Surgical History:  Procedure Laterality Date   BACK SURGERY     BASAL CELL CARCINOMA EXCISION     nose   BIV UPGRADE N/A 03/28/2017   Procedure: BiV Upgrade;  Surgeon: Deboraha Sprang, MD;  Location: [place] [facility];  Service: Cardiovascular;  Laterality: N/A;   CARDIAC CATHETERIZATION N/A 01/20/2016   Procedure: Right/Left Heart Cath and Coronary Angiography;  Surgeon: Jolaine Artist, MD;  Location: [place] [facility];  Service: Cardiovascular;  Laterality: N/A;   CARDIAC DEFIBRILLATOR PLACEMENT     medtronic virtuoso   CHOLECYSTECTOMY      COLONOSCOPY  12/11/2012   Procedure: COLONOSCOPY;  Surgeon: Inda Castle, MD;  Location: [facility];  Service: Endoscopy;  Laterality: N/A;   KNEE ARTHROPLASTY      There were no vitals filed for this visit.   Subjective Assessment - 06/22/21 1359     Subjective Pt states soreness in back and neck.    Currently in Pain? Yes    Pain Score 5     Pain Location Back    Pain Orientation Right;Left    Pain Descriptors / Indicators Aching    Pain Type Chronic pain    Pain Onset More than a month ago    Pain Frequency Intermittent    Aggravating Factors  standing , walking    Pain Score 8    Pain Location Neck    Pain Orientation Right;Left    Pain Descriptors / Indicators Aching    Pain Type Acute pain    Pain Onset More than a month ago    Pain Frequency Intermittent    Aggravating Factors  end range motion is 8/10. rest is 0/10  [facility] Adult PT Treatment/Exercise - 06/22/21 0001       Exercises   Exercises Lumbar;Neck      Neck Exercises: Theraband   Rows Green;20 reps    Rows Limitations seated      Neck Exercises: Seated   Cervical Rotation 10 reps;Both    Other Seated Exercise thoracic rotation x10;      Lumbar Exercises: Stretches   Active Hamstring Stretch 3 reps;30 seconds    Active Hamstring Stretch Limitations seated    Single Knee to Chest Stretch 3 reps;30 seconds;Right;Left    Lower Trunk Rotation 5 reps;10 seconds    Pelvic Tilt --      Lumbar Exercises: Standing   Other Standing Lumbar Exercises --      Lumbar Exercises: Seated   Sit to Stand 5 reps    Sit to Stand Limitations 5 x 2 higher mat table, no UE support      Lumbar Exercises: Supine   Bent Knee Raise 20 reps    Straight Leg Raise --    Straight Leg Raises Limitations --      Manual Therapy   Manual Therapy Joint mobilization;Soft tissue mobilization;Manual Traction;Passive ROM    Joint Mobilization Cervical PAs, light distraction  cervical 10 sec x 10;    Soft tissue mobilization STM/ DTM to bil cervical paraspinals and sub occipitals,    Passive ROM PROM for neck rotation and UT stretching                      PT Short Term Goals - 06/17/21 2228       PT SHORT TERM GOAL #1   Title Pt to be independent with initial HEP    Time 2    Period Weeks    Status New    Target Date 06/29/21               PT Long Term Goals - 06/17/21 2229       PT LONG TERM GOAL #1   Title Pt to be independent wtih final HEP    Time 6    Period Weeks    Status New    Target Date 07/27/21      PT LONG TERM GOAL #2   Title Pt to demo ability for pain free cervical rotation, to improve ability for ADLs and driving    Time 6    Period Weeks    Status New    Target Date 07/27/21      PT LONG TERM GOAL #3   Title Pt to report decreased pain in back with standing activity , to 0-2/10    Time 6    Period Weeks    Target Date 07/27/21      PT LONG TERM GOAL #4   Title Pt to demo increased functional strength, to perform sit to stand transfer without UE support, and ability for recipricol stair climbing with 1 UE support.    Time 6    Period Weeks    Status New    Target Date 07/27/21                   Plan - 06/22/21 1446     Clinical Impression Statement Pt with severe stiffness in c-spine. Did have some pain relief and mild ROM improvments after manual today. Continues to have pain in low back with standing activitiy, pt to benefit from continued strengthening and mobility for low back pain. Pt with  difficulty with upright posture due to pain, and has less pain with forward flexed posture. Discussed not letting posture stoop forward too much. Pt to benefit from continued care.    Examination-Activity Limitations Squat;Stairs;Stand;Carry;Transfers;Lift;Locomotion Level    Examination-Participation Restrictions Community Activity;Shop;Driving;Yard Work;Meal Prep    Stability/Clinical Decision  Making Stable/Uncomplicated    Rehab Potential Good    PT Frequency 2x / week    PT Duration 6 weeks    PT Treatment/Interventions ADLs/Self Care Home Management;Cryotherapy;Therapeutic activities;Moist Heat;Traction;Balance training;Therapeutic exercise;Functional mobility training;Stair training;Gait training;DME Instruction;Neuromuscular re-education;Cognitive remediation;Patient/family education;Manual techniques;Vasopneumatic Device;Taping;Dry needling;Passive range of motion;Spinal Manipulations;Joint Manipulations    Consulted and Agree with Plan of Care Patient             Patient will benefit from skilled therapeutic intervention in order to improve the following deficits and impairments:  Decreased endurance, Decreased activity tolerance, Decreased strength, Pain, Decreased balance, Decreased mobility, Difficulty walking, Increased muscle spasms, Decreased range of motion, Impaired flexibility, Hypomobility  Visit Diagnosis: Chronic bilateral low back pain without sciatica  Cervicalgia  Muscle weakness (generalized)     Problem List Patient Active Problem List   Diagnosis Date Noted   COPD (chronic obstructive pulmonary disease) ([place]) 12/26/2018   Osteoarthritis of right knee 09/22/2018   History of subdural hematoma 11/21/2017   Frontal headache 10/06/2017   Rhinitis 08/22/2017   Dyspnea on exertion 08/22/2017   Ischemic cardiomyopathy 03/28/2017   Chronic atrial fibrillation (HCC)    OSA (obstructive sleep apnea) 71/69/6789   Chronic systolic heart failure ([place]) 08/06/2015   LBBB (left bundle branch block) 08/06/2015   Carotid artery stenosis 03/17/2015   Former smoker 11/15/2014   Actinic keratosis 11/15/2014   History of cardioembolic cerebrovascular accident (CVA) 12/19/2012   Benign neoplasm of colon 12/11/2012   Implantable cardioverter-defibrillator (ICD) in situ 02/17/2012    ventricular tachycardia-non sustained  02/17/2012   History of skin cancer  07/05/2007   Diabetes mellitus type II, controlled ([place]) 10/16/2006   Hyperlipidemia associated with type 2 diabetes mellitus ([place]) 10/16/2006   Hypertension associated with diabetes ([place]) 10/16/2006   CAD (coronary artery disease) 10/16/2006   Chronic pulmonary embolism ([place]) 10/16/2006   Lyndee Hensen, PT, DPT 2:50 PM  06/22/21    [facility] [address] [place], [place], [address] Phone: [phone]   Fax:  [phone]  Name: LEVEN HOEL MRN: [id number] Date of Birth: 1941/08/30  Referring diagnosis? Low back pain,  neck pain,  muscle weakness   Treatment diagnosis? (if different than referring diagnosis)  What was this (referring dx) caused by? []  Surgery []  Fall [x]  Ongoing issue [x]  Arthritis []  Other: ____________  Laterality: []  Rt []  Lt [x]  Both  Eval Date:  7/11  8 weeks:   08/10/21   Check all possible CPT codes:      [x]  97110 (Therapeutic Exercise)  []  92507 (SLP Treatment)  [x]  97112 (Neuro Re-ed)   []  92526 (Swallowing Treatment)   [x]  97116 (Gait Training)   []  [id number] (Cognitive Training, 1st 15 minutes) [x]  97140 (Manual Therapy)   []  97130 (Cognitive Training, each add'l 15 minutes)  [x]  97530 (Therapeutic Activities)  []  Other, List CPT Code ____________    [x]  95093 (Self Care)       []  All codes above (97110 - 97535)  []  97012 (Mechanical Traction)  []  97014 (E-stim Unattended)  []  97032 (E-stim manual)  []  97033 (Ionto)  []  97035 (Ultrasound)  []  97760 (Orthotic Fit) []  [id number] (Physical Performance Training) []   97113 (Aquatic Therapy) []  97034 (Contrast Bath) []  [id number] (Paraffin) []  53967 (Wound Care 1st 20 sq cm) []  97598 (Wound Care each add'l 20 sq cm) []  97016 (Vasopneumatic Device) []  [phone] (Orthotic Training) []  [phone] (Prosthetic Training)  Lyndee Hensen, PT, DPT 4:45 PM  06/22/21

## 2021-06-25 ENCOUNTER — Ambulatory Visit (INDEPENDENT_AMBULATORY_CARE_PROVIDER_SITE_OTHER): Payer: Medicare HMO | Admitting: Physical Therapy

## 2021-06-25 ENCOUNTER — Encounter: Payer: Self-pay | Admitting: Physical Therapy

## 2021-06-25 ENCOUNTER — Other Ambulatory Visit: Payer: Self-pay

## 2021-06-25 DIAGNOSIS — M545 Low back pain, unspecified: Secondary | ICD-10-CM

## 2021-06-25 DIAGNOSIS — M6281 Muscle weakness (generalized): Secondary | ICD-10-CM

## 2021-06-25 DIAGNOSIS — G8929 Other chronic pain: Secondary | ICD-10-CM

## 2021-06-25 DIAGNOSIS — M542 Cervicalgia: Secondary | ICD-10-CM | POA: Diagnosis not present

## 2021-06-26 NOTE — Therapy (Signed)
[facility] [address] [place], [place], [address] Phone: [phone]   Fax:  [phone]  Physical Therapy Treatment  Patient Details  Name: Carl Wood MRN: [id number] Date of Birth: Aug 04, 1941 Referring Provider (PT): Teresa Coombs   Encounter Date: 06/25/2021   PT End of Session - 06/25/21 1524     Visit Number 4    Number of Visits 12    Date for PT Re-Evaluation 07/27/21    Authorization Type [employer]    PT Start Time 1518    PT Stop Time 1600    PT Time Calculation (min) 42 min    Activity Tolerance Patient tolerated treatment well    Behavior During Therapy [facility] for tasks assessed/performed             Past Medical History:  Diagnosis Date   CAD (coronary artery disease)    CHF (congestive heart failure) (HCC)    Chronic atrial fibrillation ([place])    Colon polyps    Diabetes mellitus    Diverticulosis of colon    DIVERTICULOSIS, COLON 10/16/2006   Qualifier: Diagnosis of  By: Leanne Chang MD, Bruce     Excessive daytime sleepiness 02/19/2016   Hyperlipidemia    Hypertension    MI (mitral incompetence)    Nephrolithiasis    NEPHROLITHIASIS 06/26/2008   Qualifier: Diagnosis of  By: Sherlynn Stalls, CMA, Cindy     OSA (obstructive sleep apnea) 02/19/2016   Moderate to severe OSA with an AHI of 25/hr and on CPAP at 8cm H2O   PE (pulmonary embolism)    V-tach [facility]     Past Surgical History:  Procedure Laterality Date   BACK SURGERY     BASAL CELL CARCINOMA EXCISION     nose   BIV UPGRADE N/A 03/28/2017   Procedure: BiV Upgrade;  Surgeon: Deboraha Sprang, MD;  Location: [place] [facility];  Service: Cardiovascular;  Laterality: N/A;   CARDIAC CATHETERIZATION N/A 01/20/2016   Procedure: Right/Left Heart Cath and Coronary Angiography;  Surgeon: Jolaine Artist, MD;  Location: [place] [facility];  Service: Cardiovascular;  Laterality: N/A;   CARDIAC DEFIBRILLATOR PLACEMENT     medtronic virtuoso   CHOLECYSTECTOMY      COLONOSCOPY  12/11/2012   Procedure: COLONOSCOPY;  Surgeon: Inda Castle, MD;  Location: [facility];  Service: Endoscopy;  Laterality: N/A;   KNEE ARTHROPLASTY      There were no vitals filed for this visit.   Subjective Assessment - 06/25/21 1523     Subjective pt with no new complaints.    Currently in Pain? Yes    Pain Score 5     Pain Location Back    Pain Orientation Left;Right    Pain Descriptors / Indicators Aching    Pain Type Chronic pain    Pain Onset More than a month ago    Pain Score 7    Pain Location Neck    Pain Orientation Right;Left    Pain Descriptors / Indicators Aching    Pain Type Acute pain    Pain Onset More than a month ago    Pain Frequency Intermittent                               [facility] Adult PT Treatment/Exercise - 06/26/21 0001       Exercises   Exercises Lumbar;Neck      Neck Exercises: Theraband   Rows Green;20  reps    Rows Limitations seated      Neck Exercises: Seated   Cervical Rotation 10 reps;Both    Other Seated Exercise thoracic rotation x10;      Lumbar Exercises: Stretches   Active Hamstring Stretch 3 reps;30 seconds    Active Hamstring Stretch Limitations seated    Single Knee to Chest Stretch 30 seconds;Right;Left;2 reps    Lower Trunk Rotation 5 reps;10 seconds    Pelvic Tilt 20 reps    Piriformis Stretch 3 reps;30 seconds    Piriformis Stretch Limitations supine, modified fig 4 (cross leg)      Lumbar Exercises: Seated   Sit to Stand 15 reps    Sit to Stand Limitations mat table , education on form without UEs.      Lumbar Exercises: Supine   Bent Knee Raise 20 reps      Manual Therapy   Manual Therapy Joint mobilization;Soft tissue mobilization;Manual Traction;Passive ROM    Joint Mobilization Cervical PAs, light distraction cervical 10 sec x 10;    Soft tissue mobilization STM/ DTM to bil cervical paraspinals and sub occipitals,    Passive ROM PROM for neck rotation and UT stretching                       PT Short Term Goals - 06/17/21 2228       PT SHORT TERM GOAL #1   Title Pt to be independent with initial HEP    Time 2    Period Weeks    Status New    Target Date 06/29/21               PT Long Term Goals - 06/17/21 2229       PT LONG TERM GOAL #1   Title Pt to be independent wtih final HEP    Time 6    Period Weeks    Status New    Target Date 07/27/21      PT LONG TERM GOAL #2   Title Pt to demo ability for pain free cervical rotation, to improve ability for ADLs and driving    Time 6    Period Weeks    Status New    Target Date 07/27/21      PT LONG TERM GOAL #3   Title Pt to report decreased pain in back with standing activity , to 0-2/10    Time 6    Period Weeks    Target Date 07/27/21      PT LONG TERM GOAL #4   Title Pt to demo increased functional strength, to perform sit to stand transfer without UE support, and ability for recipricol stair climbing with 1 UE support.    Time 6    Period Weeks    Status New    Target Date 07/27/21                   Plan - 06/26/21 1521     Clinical Impression Statement Pt with mild improvments in cervical rotation ROM and pain after manual today and last session, will continue to assess for long term vs short term relief. Pt with much improved ability for sit to stand without use of UEs after education and practice today. Is improving with ability for ther ex, and strengthening. Continues to have stiffness in lumbar spine that limits ROM and posture, and stiffness in c-spine that limits ROM. Plan to progress as tolerated.    Examination-Activity  Limitations Squat;Stairs;Stand;Carry;Transfers;Lift;Locomotion Level    Examination-Participation Restrictions Community Activity;Shop;Driving;Yard Work;Meal Prep    Stability/Clinical Decision Making Stable/Uncomplicated    Rehab Potential Good    PT Frequency 2x / week    PT Duration 6 weeks    PT Treatment/Interventions  ADLs/Self Care Home Management;Cryotherapy;Therapeutic activities;Moist Heat;Traction;Balance training;Therapeutic exercise;Functional mobility training;Stair training;Gait training;DME Instruction;Neuromuscular re-education;Cognitive remediation;Patient/family education;Manual techniques;Vasopneumatic Device;Taping;Dry needling;Passive range of motion;Spinal Manipulations;Joint Manipulations    Consulted and Agree with Plan of Care Patient             Patient will benefit from skilled therapeutic intervention in order to improve the following deficits and impairments:  Decreased endurance, Decreased activity tolerance, Decreased strength, Pain, Decreased balance, Decreased mobility, Difficulty walking, Increased muscle spasms, Decreased range of motion, Impaired flexibility, Hypomobility  Visit Diagnosis: Chronic bilateral low back pain without sciatica  Cervicalgia  Muscle weakness (generalized)     Problem List Patient Active Problem List   Diagnosis Date Noted   COPD (chronic obstructive pulmonary disease) ([place]) 12/26/2018   Osteoarthritis of right knee 09/22/2018   History of subdural hematoma 11/21/2017   Frontal headache 10/06/2017   Rhinitis 08/22/2017   Dyspnea on exertion 08/22/2017   Ischemic cardiomyopathy 03/28/2017   Chronic atrial fibrillation (HCC)    OSA (obstructive sleep apnea) [id number]   Chronic systolic heart failure ([place]) 08/06/2015   LBBB (left bundle branch block) 08/06/2015   Carotid artery stenosis 03/17/2015   Former smoker 11/15/2014   Actinic keratosis 11/15/2014   History of cardioembolic cerebrovascular accident (CVA) 12/19/2012   Benign neoplasm of colon 12/11/2012   Implantable cardioverter-defibrillator (ICD) in situ 02/17/2012    ventricular tachycardia-non sustained  02/17/2012   History of skin cancer 07/05/2007   Diabetes mellitus type II, controlled ([place]) 10/16/2006   Hyperlipidemia associated with type 2 diabetes mellitus ([place])  10/16/2006   Hypertension associated with diabetes ([place]) 10/16/2006   CAD (coronary artery disease) 10/16/2006   Chronic pulmonary embolism ([place]) 10/16/2006   Lyndee Hensen, PT, DPT 3:33 PM  06/26/21    [facility] [address] [place], [place], [address] Phone: [phone]   Fax:  [phone]  Name: Carl Wood MRN: [id number] Date of Birth: 04/23/1941

## 2021-06-29 ENCOUNTER — Encounter: Payer: Medicare HMO | Admitting: Physical Therapy

## 2021-07-02 ENCOUNTER — Ambulatory Visit (INDEPENDENT_AMBULATORY_CARE_PROVIDER_SITE_OTHER): Payer: Medicare HMO | Admitting: Physical Therapy

## 2021-07-02 ENCOUNTER — Other Ambulatory Visit: Payer: Self-pay

## 2021-07-02 ENCOUNTER — Encounter: Payer: Self-pay | Admitting: Physical Therapy

## 2021-07-02 DIAGNOSIS — M545 Low back pain, unspecified: Secondary | ICD-10-CM

## 2021-07-02 DIAGNOSIS — G8929 Other chronic pain: Secondary | ICD-10-CM | POA: Diagnosis not present

## 2021-07-02 DIAGNOSIS — M542 Cervicalgia: Secondary | ICD-10-CM

## 2021-07-02 DIAGNOSIS — M6281 Muscle weakness (generalized): Secondary | ICD-10-CM | POA: Diagnosis not present

## 2021-07-02 NOTE — Therapy (Signed)
[facility] [address] [place], [place], [address] Phone: [phone]   Fax:  [phone]  Physical Therapy Treatment  Patient Details  Name: Carl Wood MRN: [id number] Date of Birth: 1941-09-13 Referring Provider (PT): Teresa Coombs   Encounter Date: 07/02/2021   PT End of Session - 07/02/21 1655     Visit Number 5    Number of Visits 12    Date for PT Re-Evaluation 07/27/21    Authorization Type [employer]    PT Start Time 1602    PT Stop Time 1643    PT Time Calculation (min) 41 min    Activity Tolerance Patient tolerated treatment well    Behavior During Therapy [facility] for tasks assessed/performed             Past Medical History:  Diagnosis Date   CAD (coronary artery disease)    CHF (congestive heart failure) (HCC)    Chronic atrial fibrillation ([place])    Colon polyps    Diabetes mellitus    Diverticulosis of colon    DIVERTICULOSIS, COLON 10/16/2006   Qualifier: Diagnosis of  By: Leanne Chang MD, Bruce     Excessive daytime sleepiness 02/19/2016   Hyperlipidemia    Hypertension    MI (mitral incompetence)    Nephrolithiasis    NEPHROLITHIASIS 06/26/2008   Qualifier: Diagnosis of  By: Sherlynn Stalls, CMA, Cindy     OSA (obstructive sleep apnea) 02/19/2016   Moderate to severe OSA with an AHI of 25/hr and on CPAP at 8cm H2O   PE (pulmonary embolism)    V-tach [facility])     Past Surgical History:  Procedure Laterality Date   BACK SURGERY     BASAL CELL CARCINOMA EXCISION     nose   BIV UPGRADE N/A 03/28/2017   Procedure: BiV Upgrade;  Surgeon: Deboraha Sprang, MD;  Location: [facility];  Service: Cardiovascular;  Laterality: N/A;   CARDIAC CATHETERIZATION N/A 01/20/2016   Procedure: Right/Left Heart Cath and Coronary Angiography;  Surgeon: Jolaine Artist, MD;  Location: [place];  Service: Cardiovascular;  Laterality: N/A;   CARDIAC DEFIBRILLATOR PLACEMENT     medtronic virtuoso   CHOLECYSTECTOMY      COLONOSCOPY  12/11/2012   Procedure: COLONOSCOPY;  Surgeon: Inda Castle, MD;  Location: [facility];  Service: Endoscopy;  Laterality: N/A;   KNEE ARTHROPLASTY      There were no vitals filed for this visit.   Subjective Assessment - 07/02/21 1653     Subjective Pt states neck is less painful. He also thinks back is slightly better, but still has increased pain with standing/walking activity.    Currently in Pain? Yes    Pain Score 5     Pain Location Back    Pain Orientation Left;Right    Pain Descriptors / Indicators Aching    Pain Type Chronic pain    Pain Onset More than a month ago    Pain Frequency Intermittent    Pain Score 5    Pain Location Neck    Pain Orientation Right;Left    Pain Descriptors / Indicators Aching    Pain Type Acute pain    Pain Onset More than a month ago    Pain Frequency Intermittent                               [facility] Adult PT Treatment/Exercise - 07/02/21 0001  Exercises   Exercises Lumbar;Neck      Neck Exercises: Theraband   Rows Green;20 reps    Rows Limitations standing      Neck Exercises: Seated   Cervical Rotation --    Other Seated Exercise --      Lumbar Exercises: Stretches   Active Hamstring Stretch 3 reps;30 seconds    Active Hamstring Stretch Limitations seated    Single Knee to Chest Stretch 30 seconds;Right;1 rep    Lower Trunk Rotation 5 reps;10 seconds    Pelvic Tilt --    Piriformis Stretch --    Piriformis Stretch Limitations --      Lumbar Exercises: Aerobic   Recumbent Bike L1 x 5 min;      Lumbar Exercises: Seated   Sit to Stand 10 reps    Sit to Stand Limitations mat table      Lumbar Exercises: Supine   Clam 20 reps    Clam Limitations GTB    Bent Knee Raise 20 reps    Bridge 20 reps      Manual Therapy   Manual Therapy Joint mobilization;Soft tissue mobilization;Manual Traction;Passive ROM    Joint Mobilization Cervical PAs, light distraction cervical 10 sec x 10;  R  hip post mob gr 3;    Soft tissue mobilization STM/ DTM to bil cervical paraspinals and sub occipitals,    Passive ROM PROM for neck rotation and UT stretching    Manual Traction long leg distaction x 2 min bil for lumbar pump                      PT Short Term Goals - 06/17/21 2228       PT SHORT TERM GOAL #1   Title Pt to be independent with initial HEP    Time 2    Period Weeks    Status New    Target Date 06/29/21               PT Long Term Goals - 06/17/21 2229       PT LONG TERM GOAL #1   Title Pt to be independent wtih final HEP    Time 6    Period Weeks    Status New    Target Date 07/27/21      PT LONG TERM GOAL #2   Title Pt to demo ability for pain free cervical rotation, to improve ability for ADLs and driving    Time 6    Period Weeks    Status New    Target Date 07/27/21      PT LONG TERM GOAL #3   Title Pt to report decreased pain in back with standing activity , to 0-2/10    Time 6    Period Weeks    Target Date 07/27/21      PT LONG TERM GOAL #4   Title Pt to demo increased functional strength, to perform sit to stand transfer without UE support, and ability for recipricol stair climbing with 1 UE support.    Time 6    Period Weeks    Status New    Target Date 07/27/21                   Plan - 07/02/21 1656     Clinical Impression Statement Pt with improving pain in neck. ROM for rotation still limited but much less painful with movement and end range movement. Pt continues to be challenged  with lumbar mobility and strength due to stiffness and weakness, will benefit from continued progression of strengthening as tolerated.    Examination-Activity Limitations Squat;Stairs;Stand;Carry;Transfers;Lift;Locomotion Level    Examination-Participation Restrictions Community Activity;Shop;Driving;Yard Work;Meal Prep    Stability/Clinical Decision Making Stable/Uncomplicated    Rehab Potential Good    PT Frequency 2x / week     PT Duration 6 weeks    PT Treatment/Interventions ADLs/Self Care Home Management;Cryotherapy;Therapeutic activities;Moist Heat;Traction;Balance training;Therapeutic exercise;Functional mobility training;Stair training;Gait training;DME Instruction;Neuromuscular re-education;Cognitive remediation;Patient/family education;Manual techniques;Vasopneumatic Device;Taping;Dry needling;Passive range of motion;Spinal Manipulations;Joint Manipulations    Consulted and Agree with Plan of Care Patient             Patient will benefit from skilled therapeutic intervention in order to improve the following deficits and impairments:  Decreased endurance, Decreased activity tolerance, Decreased strength, Pain, Decreased balance, Decreased mobility, Difficulty walking, Increased muscle spasms, Decreased range of motion, Impaired flexibility, Hypomobility  Visit Diagnosis: Chronic bilateral low back pain without sciatica  Cervicalgia  Muscle weakness (generalized)     Problem List Patient Active Problem List   Diagnosis Date Noted   COPD (chronic obstructive pulmonary disease) ([place]) 12/26/2018   Osteoarthritis of right knee 09/22/2018   History of subdural hematoma 11/21/2017   Frontal headache 10/06/2017   Rhinitis 08/22/2017   Dyspnea on exertion 08/22/2017   Ischemic cardiomyopathy 03/28/2017   Chronic atrial fibrillation (HCC)    OSA (obstructive sleep apnea) [id number]   Chronic systolic heart failure ([place]) 08/06/2015   LBBB (left bundle branch block) 08/06/2015   Carotid artery stenosis 03/17/2015   Former smoker 11/15/2014   Actinic keratosis 11/15/2014   History of cardioembolic cerebrovascular accident (CVA) 12/19/2012   Benign neoplasm of colon 12/11/2012   Implantable cardioverter-defibrillator (ICD) in situ 02/17/2012    ventricular tachycardia-non sustained  02/17/2012   History of skin cancer 07/05/2007   Diabetes mellitus type II, controlled ([place]) 10/16/2006    Hyperlipidemia associated with type 2 diabetes mellitus ([place]) 10/16/2006   Hypertension associated with diabetes ([place]) 10/16/2006   CAD (coronary artery disease) 10/16/2006   Chronic pulmonary embolism ([place]) 10/16/2006   Lyndee Hensen, PT, DPT 4:58 PM  07/02/21    [facility] [address] [place], [place], [address] Phone: [phone]   Fax:  [phone]  Name: Carl Wood MRN: [id number] Date of Birth: [birth year]

## 2021-07-06 ENCOUNTER — Ambulatory Visit (INDEPENDENT_AMBULATORY_CARE_PROVIDER_SITE_OTHER): Payer: Medicare HMO | Admitting: Physical Therapy

## 2021-07-06 ENCOUNTER — Other Ambulatory Visit: Payer: Self-pay

## 2021-07-06 ENCOUNTER — Encounter: Payer: Self-pay | Admitting: Physical Therapy

## 2021-07-06 DIAGNOSIS — M545 Low back pain, unspecified: Secondary | ICD-10-CM | POA: Diagnosis not present

## 2021-07-06 DIAGNOSIS — M542 Cervicalgia: Secondary | ICD-10-CM

## 2021-07-06 DIAGNOSIS — M6281 Muscle weakness (generalized): Secondary | ICD-10-CM

## 2021-07-06 DIAGNOSIS — G8929 Other chronic pain: Secondary | ICD-10-CM

## 2021-07-07 DIAGNOSIS — M48062 Spinal stenosis, lumbar region with neurogenic claudication: Secondary | ICD-10-CM | POA: Diagnosis not present

## 2021-07-07 DIAGNOSIS — M47812 Spondylosis without myelopathy or radiculopathy, cervical region: Secondary | ICD-10-CM | POA: Diagnosis not present

## 2021-07-07 DIAGNOSIS — M9903 Segmental and somatic dysfunction of lumbar region: Secondary | ICD-10-CM | POA: Diagnosis not present

## 2021-07-07 DIAGNOSIS — Z7901 Long term (current) use of anticoagulants: Secondary | ICD-10-CM | POA: Diagnosis not present

## 2021-07-07 DIAGNOSIS — R251 Tremor, unspecified: Secondary | ICD-10-CM | POA: Diagnosis not present

## 2021-07-07 DIAGNOSIS — R262 Difficulty in walking, not elsewhere classified: Secondary | ICD-10-CM | POA: Diagnosis not present

## 2021-07-07 DIAGNOSIS — M9902 Segmental and somatic dysfunction of thoracic region: Secondary | ICD-10-CM | POA: Diagnosis not present

## 2021-07-07 DIAGNOSIS — M9901 Segmental and somatic dysfunction of cervical region: Secondary | ICD-10-CM | POA: Diagnosis not present

## 2021-07-07 NOTE — Therapy (Signed)
[facility] [address] [place], [place], [address] Phone: [phone]   Fax:  [phone]  Physical Therapy Treatment  Patient Details  Name: Carl Wood MRN: [id number] Date of Birth: January 07, 1941 Referring Provider (PT): Teresa Coombs   Encounter Date: 07/06/2021   PT End of Session - 07/06/21 1414     Visit Number 6    Number of Visits 12    Date for PT Re-Evaluation 07/27/21    Authorization Type [employer]    PT Start Time 1346    PT Stop Time 1430    PT Time Calculation (min) 44 min    Activity Tolerance Patient tolerated treatment well    Behavior During Therapy [facility] for tasks assessed/performed             Past Medical History:  Diagnosis Date   CAD (coronary artery disease)    CHF (congestive heart failure) (HCC)    Chronic atrial fibrillation ([place])    Colon polyps    Diabetes mellitus    Diverticulosis of colon    DIVERTICULOSIS, COLON 10/16/2006   Qualifier: Diagnosis of  By: Leanne Chang MD, Bruce     Excessive daytime sleepiness 02/19/2016   Hyperlipidemia    Hypertension    MI (mitral incompetence)    Nephrolithiasis    NEPHROLITHIASIS 06/26/2008   Qualifier: Diagnosis of  By: Sherlynn Stalls, CMA, Cindy     OSA (obstructive sleep apnea) 02/19/2016   Moderate to severe OSA with an AHI of 25/hr and on CPAP at 8cm H2O   PE (pulmonary embolism)    V-tach [facility])     Past Surgical History:  Procedure Laterality Date   BACK SURGERY     BASAL CELL CARCINOMA EXCISION     nose   BIV UPGRADE N/A 03/28/2017   Procedure: BiV Upgrade;  Surgeon: Deboraha Sprang, MD;  Location: [facility];  Service: Cardiovascular;  Laterality: N/A;   CARDIAC CATHETERIZATION N/A 01/20/2016   Procedure: Right/Left Heart Cath and Coronary Angiography;  Surgeon: Jolaine Artist, MD;  Location: [facility];  Service: Cardiovascular;  Laterality: N/A;   CARDIAC DEFIBRILLATOR PLACEMENT     medtronic virtuoso   CHOLECYSTECTOMY      COLONOSCOPY  12/11/2012   Procedure: COLONOSCOPY;  Surgeon: Inda Castle, MD;  [facility];  Service: Endoscopy;  Laterality: N/A;   KNEE ARTHROPLASTY      There were no vitals filed for this visit.   Subjective Assessment - 07/06/21 1412     Subjective Pt states back pain is about the same. Neck still doing a bit better.    Currently in Pain? Yes    Pain Score 2     Pain Location Back    Pain Orientation Right;Left    Pain Descriptors / Indicators Aching    Pain Type Chronic pain    Pain Radiating Towards up to 8/10 with activity    Pain Onset More than a month ago    Pain Frequency Intermittent    Pain Score 5    Pain Location Neck    Pain Orientation Right;Left    Pain Descriptors / Indicators Aching    Pain Type Acute pain    Pain Onset More than a month ago    Pain Frequency Intermittent    Aggravating Factors  up to 5/10 at end range. rest is 0/10.  [facility] Adult PT Treatment/Exercise - 07/07/21 0001       Exercises   Exercises Lumbar;Neck      Neck Exercises: Theraband   Rows Green;20 reps    Rows Limitations standing      Neck Exercises: Seated   Other Seated Exercise thoracic rotation x10;      Lumbar Exercises: Stretches   Active Hamstring Stretch 3 reps;30 seconds    Active Hamstring Stretch Limitations seated    Lower Trunk Rotation 5 reps;10 seconds      Lumbar Exercises: Aerobic   Recumbent Bike L1 x 6 min;      Lumbar Exercises: Standing   Other Standing Lumbar Exercises March, hip abd x 20 ea bil;      Lumbar Exercises: Seated   Sit to Stand 10 reps    Sit to Stand Limitations mat table      Lumbar Exercises: Supine   Clam 20 reps    Clam Limitations GTB    Bent Knee Raise 20 reps    Bridge with clamshell 20 reps      Manual Therapy   Manual Therapy Joint mobilization;Soft tissue mobilization;Manual Traction;Passive ROM    Joint Mobilization Cervical PAs, light distraction  cervical 10 sec x 10;  R hip post mob gr 3;    Soft tissue mobilization STM/ DTM to bil cervical paraspinals and sub occipitals,    Passive ROM PROM for neck rotation and UT stretching    Manual Traction long leg distaction x 2 min bil for lumbar pump                      PT Short Term Goals - 06/17/21 2228       PT SHORT TERM GOAL #1   Title Pt to be independent with initial HEP    Time 2    Period Weeks    Status New    Target Date 06/29/21               PT Long Term Goals - 06/17/21 2229       PT LONG TERM GOAL #1   Title Pt to be independent wtih final HEP    Time 6    Period Weeks    Status New    Target Date 07/27/21      PT LONG TERM GOAL #2   Title Pt to demo ability for pain free cervical rotation, to improve ability for ADLs and driving    Time 6    Period Weeks    Status New    Target Date 07/27/21      PT LONG TERM GOAL #3   Title Pt to report decreased pain in back with standing activity , to 0-2/10    Time 6    Period Weeks    Target Date 07/27/21      PT LONG TERM GOAL #4   Title Pt to demo increased functional strength, to perform sit to stand transfer without UE support, and ability for recipricol stair climbing with 1 UE support.    Time 6    Period Weeks    Status New    Target Date 07/27/21                   Plan - 07/07/21 1216     Clinical Impression Statement Pt with improving pain in neck. He has limited ROM, but is pain free within his available range. Does have increased pain(less ) when  he pushes it further. He continues to have pain in low back with increased standing activity, that has been minimally changed thus far. He is doing much better with HEP, strengthening, and mobility exercises. He does have quite a bit of stiffness that limits hip motion and lumbar motion, as well as limits ability for upright posture. He has f/u with MD this week, will discuss d/c plan for pt after MD appt.     Examination-Activity Limitations Squat;Stairs;Stand;Carry;Transfers;Lift;Locomotion Level    Examination-Participation Restrictions Community Activity;Shop;Driving;Yard Work;Meal Prep    Stability/Clinical Decision Making Stable/Uncomplicated    Rehab Potential Good    PT Frequency 2x / week    PT Duration 6 weeks    PT Treatment/Interventions ADLs/Self Care Home Management;Cryotherapy;Therapeutic activities;Moist Heat;Traction;Balance training;Therapeutic exercise;Functional mobility training;Stair training;Gait training;DME Instruction;Neuromuscular re-education;Cognitive remediation;Patient/family education;Manual techniques;Vasopneumatic Device;Taping;Dry needling;Passive range of motion;Spinal Manipulations;Joint Manipulations    Consulted and Agree with Plan of Care Patient             Patient will benefit from skilled therapeutic intervention in order to improve the following deficits and impairments:  Decreased endurance, Decreased activity tolerance, Decreased strength, Pain, Decreased balance, Decreased mobility, Difficulty walking, Increased muscle spasms, Decreased range of motion, Impaired flexibility, Hypomobility  Visit Diagnosis: Chronic bilateral low back pain without sciatica  Muscle weakness (generalized)  Cervicalgia     Problem List Patient Active Problem List   Diagnosis Date Noted   COPD (chronic obstructive pulmonary disease) ([place]) 12/26/2018   Osteoarthritis of right knee 09/22/2018   History of subdural hematoma 11/21/2017   Frontal headache 10/06/2017   Rhinitis 08/22/2017   Dyspnea on exertion 08/22/2017   Ischemic cardiomyopathy 03/28/2017   Chronic atrial fibrillation (HCC)    OSA (obstructive sleep apnea) [id number]   Chronic systolic heart failure ([place]) 08/06/2015   LBBB (left bundle branch block) 08/06/2015   Carotid artery stenosis 03/17/2015   Former smoker 11/15/2014   Actinic keratosis 11/15/2014   History of cardioembolic  cerebrovascular accident (CVA) 12/19/2012   Benign neoplasm of colon 12/11/2012   Implantable cardioverter-defibrillator (ICD) in situ 02/17/2012    ventricular tachycardia-non sustained  02/17/2012   History of skin cancer 07/05/2007   Diabetes mellitus type II, controlled ([place]) 10/16/2006   Hyperlipidemia associated with type 2 diabetes mellitus ([place]) 10/16/2006   Hypertension associated with diabetes ([place]) 10/16/2006   CAD (coronary artery disease) 10/16/2006   Chronic pulmonary embolism ([place]) 10/16/2006   Lyndee Hensen, PT, DPT 12:19 PM  07/07/21    [facility] [address] [place], [place], [address] Phone: [phone]   Fax:  [phone]  Name: FILIMON WETTER MRN: [id number] Date of Birth: [birth year]

## 2021-07-09 ENCOUNTER — Other Ambulatory Visit (HOSPITAL_COMMUNITY): Payer: Self-pay

## 2021-07-09 ENCOUNTER — Other Ambulatory Visit: Payer: Self-pay

## 2021-07-09 ENCOUNTER — Encounter: Payer: Self-pay | Admitting: Physical Therapy

## 2021-07-09 ENCOUNTER — Ambulatory Visit (INDEPENDENT_AMBULATORY_CARE_PROVIDER_SITE_OTHER): Payer: Medicare HMO | Admitting: Physical Therapy

## 2021-07-09 DIAGNOSIS — M542 Cervicalgia: Secondary | ICD-10-CM | POA: Diagnosis not present

## 2021-07-09 DIAGNOSIS — M6281 Muscle weakness (generalized): Secondary | ICD-10-CM | POA: Diagnosis not present

## 2021-07-09 DIAGNOSIS — M545 Low back pain, unspecified: Secondary | ICD-10-CM | POA: Diagnosis not present

## 2021-07-09 DIAGNOSIS — G8929 Other chronic pain: Secondary | ICD-10-CM

## 2021-07-09 MED ORDER — ENTRESTO 49-51 MG PO TABS: 1.0000 | ORAL_TABLET | Freq: Two times a day (BID) | ORAL | 0 refills | Status: DC

## 2021-07-09 NOTE — Therapy (Addendum)
[facility] [address] [place], [place], [address] Phone: [phone]   Fax:  [phone]  Physical Therapy Treatment  Patient Details  Name: Carl Wood MRN: [id number] Date of Birth: 1941-10-03 Referring Provider (PT): Teresa Coombs   Encounter Date: 07/09/2021   PT End of Session - 07/09/21 1522     Visit Number 7    Number of Visits 12    Date for PT Re-Evaluation 07/27/21    Authorization Type [employer]    PT Start Time [id number]    PT Stop Time 1555    PT Time Calculation (min) 40 min    Activity Tolerance Patient tolerated treatment well    Behavior During Therapy [facility] for tasks assessed/performed             Past Medical History:  Diagnosis Date   CAD (coronary artery disease)    CHF (congestive heart failure) (HCC)    Chronic atrial fibrillation ([place])    Colon polyps    Diabetes mellitus    Diverticulosis of colon    DIVERTICULOSIS, COLON 10/16/2006   Qualifier: Diagnosis of  By: Leanne Chang MD, Bruce     Excessive daytime sleepiness 02/19/2016   Hyperlipidemia    Hypertension    MI (mitral incompetence)    Nephrolithiasis    NEPHROLITHIASIS 06/26/2008   Qualifier: Diagnosis of  By: Sherlynn Stalls, CMA, Cindy     OSA (obstructive sleep apnea) 02/19/2016   Moderate to severe OSA with an AHI of 25/hr and on CPAP at 8cm H2O   PE (pulmonary embolism)    V-tach [facility])     Past Surgical History:  Procedure Laterality Date   BACK SURGERY     BASAL CELL CARCINOMA EXCISION     nose   BIV UPGRADE N/A 03/28/2017   Procedure: BiV Upgrade;  Surgeon: Deboraha Sprang, MD;  Location: [facility];  Service: Cardiovascular;  Laterality: N/A;   CARDIAC CATHETERIZATION N/A 01/20/2016   Procedure: Right/Left Heart Cath and Coronary Angiography;  Surgeon: Jolaine Artist, MD;  Location: [facility];  Service: Cardiovascular;  Laterality: N/A;   CARDIAC DEFIBRILLATOR PLACEMENT     medtronic virtuoso   CHOLECYSTECTOMY      COLONOSCOPY  12/11/2012   Procedure: COLONOSCOPY;  Surgeon: Inda Castle, MD;  Location: [facility];  Service: Endoscopy;  Laterality: N/A;   KNEE ARTHROPLASTY      There were no vitals filed for this visit.   Subjective Assessment - 07/09/21 1520     Subjective Pt had f/u with MD, who recommended continued PT for 4 weeks.    Currently in Pain? Yes    Pain Score 2     Pain Location Back    Pain Orientation Right;Left    Pain Descriptors / Indicators Aching    Pain Type Chronic pain    Pain Radiating Towards up to 8/10 with standing activity.    Pain Onset More than a month ago    Pain Frequency Intermittent    Pain Score 5    Pain Location Neck    Pain Orientation Right;Left    Pain Descriptors / Indicators Aching    Pain Type Acute pain    Pain Onset More than a month ago    Pain Frequency Intermittent                               [facility] Adult PT Treatment/Exercise -  07/09/21 0001       Exercises   Exercises Lumbar;Neck      Neck Exercises: Theraband   Rows Green;20 reps    Rows Limitations standing      Neck Exercises: Seated   Other Seated Exercise thoracic rotation (with cervical rotation)  x10;      Lumbar Exercises: Stretches   Active Hamstring Stretch 3 reps;30 seconds    Active Hamstring Stretch Limitations seated    Lower Trunk Rotation 5 reps;10 seconds    Pelvic Tilt 20 reps    Other Lumbar Stretch Exercise supine hip flexor stretch off side of table 10 sec x 5 bil;      Lumbar Exercises: Aerobic   Recumbent Bike L1 x 6 min;      Lumbar Exercises: Standing   Row 20 reps    Theraband Level (Row) Level 4 (Blue)    Other Standing Lumbar Exercises March, hip abd x 20 ea bil;      Lumbar Exercises: Seated   Sit to Stand 10 reps    Sit to Stand Limitations mat table      Lumbar Exercises: Supine   Bridge with clamshell 20 reps      Manual Therapy   Manual Therapy Joint mobilization;Soft tissue mobilization;Manual  Traction;Passive ROM    Joint Mobilization Cervical PAs, light distraction cervical 10 sec x 10;    Soft tissue mobilization STM/ DTM to bil cervical paraspinals and sub occipitals,    Passive ROM PROM for neck rotation and UT stretching    Manual Traction long leg distaction x 2 min bil for lumbar pump                      PT Short Term Goals - 07/09/21 1523       PT SHORT TERM GOAL #1   Title Pt to be independent with initial HEP    Time 2    Period Weeks    Status Achieved    Target Date 06/29/21               PT Long Term Goals - 06/17/21 2229       PT LONG TERM GOAL #1   Title Pt to be independent wtih final HEP    Time 6    Period Weeks    Status New    Target Date 07/27/21      PT LONG TERM GOAL #2   Title Pt to demo ability for pain free cervical rotation, to improve ability for ADLs and driving    Time 6    Period Weeks    Status New    Target Date 07/27/21      PT LONG TERM GOAL #3   Title Pt to report decreased pain in back with standing activity , to 0-2/10    Time 6    Period Weeks    Target Date 07/27/21      PT LONG TERM GOAL #4   Title Pt to demo increased functional strength, to perform sit to stand transfer without UE support, and ability for recipricol stair climbing with 1 UE support.    Time 6    Period Weeks    Status New    Target Date 07/27/21                   Plan - 07/09/21 1650     Clinical Impression Statement MD has recommended continued care. Pt will also have appt  to discuss possible injections. Pt with improved ability for hip flexor stretch today, able to feel active stretch without low back pain. Added to HEP for improving hip flexor tightness and to help posture. Pt with pain improvments, and small ROM improvements in c-spine. Pt to benefit from continued care, to improve mobility, stiffness, strength, and pain.    Examination-Activity Limitations Squat;Stairs;Stand;Carry;Transfers;Lift;Locomotion  Level    Examination-Participation Restrictions Community Activity;Shop;Driving;Yard Work;Meal Prep    Stability/Clinical Decision Making Stable/Uncomplicated    Rehab Potential Good    PT Frequency 2x / week    PT Duration 6 weeks    PT Treatment/Interventions ADLs/Self Care Home Management;Cryotherapy;Therapeutic activities;Moist Heat;Traction;Balance training;Therapeutic exercise;Functional mobility training;Stair training;Gait training;DME Instruction;Neuromuscular re-education;Cognitive remediation;Patient/family education;Manual techniques;Vasopneumatic Device;Taping;Dry needling;Passive range of motion;Spinal Manipulations;Joint Manipulations    Consulted and Agree with Plan of Care Patient             Patient will benefit from skilled therapeutic intervention in order to improve the following deficits and impairments:  Decreased endurance, Decreased activity tolerance, Decreased strength, Pain, Decreased balance, Decreased mobility, Difficulty walking, Increased muscle spasms, Decreased range of motion, Impaired flexibility, Hypomobility  Visit Diagnosis: Chronic bilateral low back pain without sciatica  Muscle weakness (generalized)  Cervicalgia     Problem List Patient Active Problem List   Diagnosis Date Noted   COPD (chronic obstructive pulmonary disease) ([place]) 12/26/2018   Osteoarthritis of right knee 09/22/2018   History of subdural hematoma 11/21/2017   Frontal headache 10/06/2017   Rhinitis 08/22/2017   Dyspnea on exertion 08/22/2017   Ischemic cardiomyopathy 03/28/2017   Chronic atrial fibrillation (HCC)    OSA (obstructive sleep apnea) [id number]   Chronic systolic heart failure ([place]) 08/06/2015   LBBB (left bundle branch block) 08/06/2015   Carotid artery stenosis 03/17/2015   Former smoker 11/15/2014   Actinic keratosis 11/15/2014   History of cardioembolic cerebrovascular accident (CVA) 12/19/2012   Benign neoplasm of colon 12/11/2012   Implantable  cardioverter-defibrillator (ICD) in situ 02/17/2012    ventricular tachycardia-non sustained  02/17/2012   History of skin cancer 07/05/2007   Diabetes mellitus type II, controlled ([place]) 10/16/2006   Hyperlipidemia associated with type 2 diabetes mellitus ([place]) 10/16/2006   Hypertension associated with diabetes ([place]) 10/16/2006   CAD (coronary artery disease) 10/16/2006   Chronic pulmonary embolism ([place]) 10/16/2006   Lyndee Hensen, PT, DPT 4:55 PM  07/09/21    [facility] [address] [place], [place], [address] Phone: [phone]   Fax:  [phone]  Name: Carl Wood MRN: [id number] Date of Birth: 1941/06/25   PHYSICAL THERAPY DISCHARGE SUMMARY  Visits from Start of Care: 7 Plan: Patient agrees to discharge.  Patient goals were partially met. Patient is being discharged due to- not returning since last visit.      Lyndee Hensen, PT, DPT 11:53 AM  12/30/22

## 2021-07-10 ENCOUNTER — Other Ambulatory Visit: Payer: Self-pay

## 2021-07-10 ENCOUNTER — Encounter: Payer: Self-pay | Admitting: Family Medicine

## 2021-07-10 ENCOUNTER — Ambulatory Visit (INDEPENDENT_AMBULATORY_CARE_PROVIDER_SITE_OTHER): Payer: Medicare HMO | Admitting: Family Medicine

## 2021-07-10 VITALS — BP 99/62 | HR 77 | Temp 98.0°F | Ht 72.0 in | Wt 209.2 lb

## 2021-07-10 DIAGNOSIS — J449 Chronic obstructive pulmonary disease, unspecified: Secondary | ICD-10-CM | POA: Diagnosis not present

## 2021-07-10 DIAGNOSIS — I152 Hypertension secondary to endocrine disorders: Secondary | ICD-10-CM

## 2021-07-10 DIAGNOSIS — I6523 Occlusion and stenosis of bilateral carotid arteries: Secondary | ICD-10-CM

## 2021-07-10 DIAGNOSIS — Z87891 Personal history of nicotine dependence: Secondary | ICD-10-CM

## 2021-07-10 DIAGNOSIS — G4733 Obstructive sleep apnea (adult) (pediatric): Secondary | ICD-10-CM | POA: Diagnosis not present

## 2021-07-10 DIAGNOSIS — Z95810 Presence of automatic (implantable) cardiac defibrillator: Secondary | ICD-10-CM

## 2021-07-10 DIAGNOSIS — E119 Type 2 diabetes mellitus without complications: Secondary | ICD-10-CM

## 2021-07-10 DIAGNOSIS — I2510 Atherosclerotic heart disease of native coronary artery without angina pectoris: Secondary | ICD-10-CM | POA: Diagnosis not present

## 2021-07-10 DIAGNOSIS — E1159 Type 2 diabetes mellitus with other circulatory complications: Secondary | ICD-10-CM

## 2021-07-10 DIAGNOSIS — E1169 Type 2 diabetes mellitus with other specified complication: Secondary | ICD-10-CM

## 2021-07-10 DIAGNOSIS — E785 Hyperlipidemia, unspecified: Secondary | ICD-10-CM

## 2021-07-10 DIAGNOSIS — I482 Chronic atrial fibrillation, unspecified: Secondary | ICD-10-CM | POA: Diagnosis not present

## 2021-07-10 NOTE — Patient Instructions (Addendum)
Health Maintenance Due  Topic Date Due   COVID-19 Vaccine (5 - Booster for [employer] series) Discuss.   -keep ears out on new information in the Fall. 07/10/2021   INFLUENZA VACCINE   -Please consider getting your flu shot in the Fall. If you get this outside of our office, please let us know.  07/06/2021   Take Lasix weekly until next visit.  Happy to place referral to Dr.Tat for pill rolling tremor for her expert opinion  Thanks for doing labs If you have mychart- we will send your results within 3 business days of [place] receiving them.  If you do not have mychart- we will call you about results within 5 business days of [place] receiving them.  *please also note that you will see labs on mychart as soon as they post. I will later go in and write notes on them- will say "notes from Dr. Yong Channel"   Recommended follow up: Return in about 4 months (around 11/09/2021) for follow-up or sooner as needed.

## 2021-07-10 NOTE — Progress Notes (Signed)
Phone [phone] In person visit   Subjective:   Carl Wood is a 80 y.o. year old very pleasant male patient who presents for/with See problem oriented charting Chief Complaint  Patient presents with   Diabetes   Hypertension   COPD    This visit occurred during the SARS-CoV-2 public health emergency.  Safety protocols were in place, including screening questions prior to the visit, additional usage of staff PPE, and extensive cleaning of exam room while observing appropriate contact time as indicated for disinfecting solutions.   Past Medical History-  Patient Active Problem List   Diagnosis Date Noted   COPD (chronic obstructive pulmonary disease) ([place]) 12/26/2018    Priority: High   History of subdural hematoma 11/21/2017    Priority: High   Chronic atrial fibrillation ([place])     Priority: High   Chronic systolic heart failure ([place]) 08/06/2015    Priority: High   Former smoker 11/15/2014    Priority: High   History of cardioembolic cerebrovascular accident (CVA) 12/19/2012    Priority: High    ventricular tachycardia-non sustained  02/17/2012    Priority: High   Diabetes mellitus type II, controlled ([place]) 10/16/2006    Priority: High   CAD (coronary artery disease) 10/16/2006    Priority: High   Chronic pulmonary embolism ([place]) 10/16/2006    Priority: High   Dyspnea on exertion 08/22/2017    Priority: Medium   OSA (obstructive sleep apnea) 02/19/2016    Priority: Medium   Carotid artery stenosis 03/17/2015    Priority: Medium   Implantable cardioverter-defibrillator (ICD) in situ 02/17/2012    Priority: Medium   History of skin cancer 07/05/2007    Priority: Medium   Hyperlipidemia associated with type 2 diabetes mellitus ([place]) 10/16/2006    Priority: Medium   Hypertension associated with diabetes ([place]) 10/16/2006    Priority: Medium   Osteoarthritis of right knee 09/22/2018    Priority: Low   Frontal headache 10/06/2017    Priority: Low    Rhinitis 08/22/2017    Priority: Low   LBBB (left bundle branch block) 08/06/2015    Priority: Low   Actinic keratosis 11/15/2014    Priority: Low   Benign neoplasm of colon 12/11/2012    Priority: Low   Ischemic cardiomyopathy 03/28/2017    Medications- reviewed and updated Current Outpatient Medications  Medication Sig Dispense Refill   ACCU-CHEK AVIVA PLUS test strip USE  STRIP TO CHECK GLUCOSE ONCE DAILY 100 each 4   Ascorbic Acid (VITAMIN C PO) Take 1 tablet by mouth.     carvedilol (COREG) 12.5 MG tablet TAKE 1 TABLET BY MOUTH TWICE DAILY WITH A MEAL 180 tablet 3   digoxin (LANOXIN) 0.25 MG tablet Take 1/2 (one-half) tablet by mouth once daily 45 tablet 2   furosemide (LASIX) 20 MG tablet Take 20 mg by mouth as needed.     glimepiride (AMARYL) 4 MG tablet TAKE 2 TABLETS BY MOUTH ONCE DAILY WITH BREAKFAST 180 tablet 0   JARDIANCE 10 MG TABS tablet Take 1 tablet by mouth once daily 30 tablet 0   metFORMIN (GLUCOPHAGE-XR) 500 MG 24 hr tablet Take 2 tablets (1,000 mg total) by mouth in the morning and at bedtime. 360 tablet 3   rivaroxaban (XARELTO) 20 MG TABS tablet Take 1 tablet (20 mg total) by mouth daily. 30 tablet 0   rosuvastatin (CRESTOR) 40 MG tablet      sacubitril-valsartan (ENTRESTO) 49-51 MG Take 1 tablet by mouth 2 (two) times  daily. 120 tablet 0   No current facility-administered medications for this visit.     Objective:  BP 99/62   Pulse 77   Temp 98 F (36.7 C) (Temporal)   Ht 6' (1.829 m)   Wt 209 lb 3.2 oz (94.9 kg)   SpO2 94%   BMI 28.37 kg/m  Gen: NAD, resting comfortably CV: RRR no murmurs rubs or gallops Lungs: CTAB no crackles, wheeze, rhonchi Abdomen: soft/nontender/nondistended/normal bowel sounds.  Ext: trace edema Skin: warm, dry  Diabetic Foot Exam - Simple   Simple Foot Form Diabetic Foot exam was performed with the following findings: Yes 07/10/2021  5:07 PM  Visual Inspection No deformities, no ulcerations, no other skin breakdown  bilaterally: Yes Sensation Testing Intact to touch and monofilament testing bilaterally: Yes Pulse Check Posterior Tibialis and Dorsalis pulse intact bilaterally: Yes Comments        Assessment and Plan   # working with PT on neck and back pain- neck better, back still struggling. Worse with walking. Sitting does still help  #Chronic systolic heart failure-follows with Dr. Haroldine Laws S: Compliant with carvedilol 12.5 mg twice a day, digoxin, Lasix 20 mg-once every 10 days, Entresto. Also on Jardiance for diabetes which is favorable for heart failure patients -ICD monitored by cardiology - no chest pain and no increased shortness of breath. Sparing edema- left foot worse. Up 7 lbs from April  A/P: CHF with weight trending up- no edema- did recommend weekly lasix until next visit instead of every 10 day  # Atrial fibrillation chronic/ history of cardioemolic CVA/history subdural hematoma S: Compliant with carvedilol 12.5 mg twice a day for rate control and Xarelto 20 mg for anticoagulation. -Does have history of subdural hematoma-Xarelto was held short-term but was restarted once resolved -History of cardioembolic strokes we prefer to have him on Xarelto as long as no falls- no falls recently A/P: Atrial fibrillation is appropriately anticoagulated and rate controlled-continue current medication  #CAD/hyperlipidemia/carotid artery stenosis #ICD in place with history of ventricular tachycardia S: History of MI in 1983. Denies history of stents or bypass-streptokinase was used in the past per patient. -Due to bleeding risk is on Xarelto  20 mg daily alone and not on aspirin -Patient compliant with rosuvastatin 40 mg daily. LDL goal under 70 - consider zetia Lab Results  Component Value Date   CHOL 112 03/10/2021   HDL 38.20 (L) 03/10/2021   LDLCALC 50 03/10/2021   LDLDIRECT 77 11/06/2020   TRIG 119.0 03/10/2021   CHOLHDL 3 03/10/2021   A/P: CAD without chest pain- continue current  meds. Stable SOB but known copd. Lipids at goal with LDL under 70  #COPD S: Compliant with Symbicort in the past - he stopped when he ran out Patient reports stable shortness of breath and cough despite stopping - does not use albuterol and declines A/P: Stable. Continue current medications.   # Diabetes S: Compliant with metformin '1000mg'$  in the morning and bedtime, jardiance '10mg'$  daily, (q2 hour urination so wants to avoid increase ), amaryl 8 mg once daily with breakfast CBGs- 140-160 Exercise and diet- doing exercises through PT A/P:  hopefully Stable or improved- update a1c today. Continue current medications.    #Hypertension S: Compliant with carvedilol, Entresto, Lasix. Blood pressure traditionally runs very low but these medications have been required due to CHF  BP Readings from Last 3 Encounters:  07/10/21 99/62  04/14/21 100/64  03/10/21 104/64  A/P: needs these meds for CHF- BP running low but not  particularly lightheaded or dizzy- continuecurrent meds  #OSA-compliant with CPAP  #Former smoker-enrolled in lung cancer screening program until no longerqualifies-potentially at 70- scheduled November 2022  #urinary frequency- occasional mild incontinence with jardiance. 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 Avon Gully reports he has had this in bilateral hands for at least 8 years-prior neurologist who saw him for stroke thought this was a side effect of the stroke.  Dr. Paulla Fore also noted potential bradykinesia-patient has back pain issues and has always moved slowly-no recent worsening that I am aware of.  I still do not think it would be unreasonable for him to see Dr. Carles Collet of neurology-he is going to think this over and let us know  Recommended follow up: Return in about 4 months (around 11/09/2021) for follow-up or sooner as needed. Future Appointments  Date  Time Provider [place]  07/20/2021  4:00 PM Bubba Hales April Ma L, PT [facility]  07/23/2021  4:00 PM Bubba Hales April Ma L, PT [facility]  07/28/2021  7:20 AM [facility] DEVICE REMOTES [facility]  07/30/2021  4:00 PM Lyndee Hensen, PT [facility]  08/03/2021  4:00 PM Lyndee Hensen, PT [facility]  08/06/2021  4:00 PM Lyndee Hensen, PT [facility]  09/11/2021 11:40 AM Bensimhon, Shaune Pascal, MD [facility] None  10/27/2021  7:20 AM [facility] DEVICE REMOTES [facility]  01/26/2022  7:20 AM [facility] DEVICE REMOTES [facility]  04/27/2022  7:20 AM [facility] DEVICE REMOTES [facility]    Lab/Order associations:   ICD-10-CM   1. Coronary artery disease involving native coronary artery of native heart without angina pectoris  I25.10     2. Hyperlipidemia associated with type 2 diabetes mellitus ([place])  E11.69    E78.5     3. Bilateral carotid artery stenosis  I65.23     4. Implantable cardioverter-defibrillator (ICD) in situ  Z95.810     5. Chronic obstructive pulmonary disease, unspecified COPD type ([place])  J44.9     6. Controlled type 2 diabetes mellitus without complication, without long-term current use of insulin (HCC)  E11.9 CBC with Differential/Platelet    Comprehensive metabolic panel    Hemoglobin A1c    Hemoglobin A1c    Comprehensive metabolic panel    CBC with Differential/Platelet    7. Hypertension associated with diabetes ([place])  E11.59    I15.2     8. OSA (obstructive sleep apnea)  G47.33     9. Former smoker  Z87.891       Felicie Morn Bradford,acting as a Education administrator for [name], MD.,have documented all relevant documentation on the behalf of [name], MD,as directed by  [name], MD while in the presence of [name], MD.  I, [name], MD, have reviewed all documentation for this visit. The documentation on 07/10/21 for the exam, diagnosis, procedures, and orders are all  accurate and complete.  Return precautions advised.  [name], MD

## 2021-07-11 ENCOUNTER — Other Ambulatory Visit (HOSPITAL_COMMUNITY): Payer: Self-pay | Admitting: Internal Medicine

## 2021-07-11 LAB — COMPREHENSIVE METABOLIC PANEL
AG Ratio: 1.5 (calc) (ref 1.0–2.5)
ALT: 17 U/L (ref 9–46)
AST: 16 U/L (ref 10–35)
Albumin: 4 g/dL (ref 3.6–5.1)
Alkaline phosphatase (APISO): 62 U/L (ref 35–144)
BUN: 18 mg/dL (ref 7–25)
CO2: 25 mmol/L (ref 20–32)
Calcium: 9.6 mg/dL (ref 8.6–10.3)
Chloride: 104 mmol/L (ref 98–110)
Creat: 0.94 mg/dL (ref 0.70–1.28)
Globulin: 2.6 g/dL (calc) (ref 1.9–3.7)
Glucose, Bld: 235 mg/dL — ABNORMAL HIGH (ref 65–99)
Potassium: 4.5 mmol/L (ref 3.5–5.3)
Sodium: 139 mmol/L (ref 135–146)
Total Bilirubin: 0.7 mg/dL (ref 0.2–1.2)
Total Protein: 6.6 g/dL (ref 6.1–8.1)

## 2021-07-11 LAB — CBC WITH DIFFERENTIAL/PLATELET
Absolute Monocytes: 624 cells/uL (ref 200–950)
Basophils Absolute: 38 cells/uL (ref 0–200)
Basophils Relative: 0.6 %
Eosinophils Absolute: 158 cells/uL (ref 15–500)
Eosinophils Relative: 2.5 %
HCT: 47 % (ref 38.5–50.0)
Hemoglobin: 15.6 g/dL (ref 13.2–17.1)
Lymphs Abs: 1071 cells/uL (ref 850–3900)
MCH: 32.5 pg (ref 27.0–33.0)
MCHC: 33.2 g/dL (ref 32.0–36.0)
MCV: 97.9 fL (ref 80.0–100.0)
MPV: 10.9 fL (ref 7.5–12.5)
Monocytes Relative: 9.9 %
Neutro Abs: 4410 cells/uL (ref 1500–7800)
Neutrophils Relative %: 70 %
Platelets: 163 10*3/uL (ref 140–400)
RBC: 4.8 10*6/uL (ref 4.20–5.80)
RDW: 12.5 % (ref 11.0–15.0)
Total Lymphocyte: 17 %
WBC: 6.3 10*3/uL (ref 3.8–10.8)

## 2021-07-11 LAB — HEMOGLOBIN A1C
Hgb A1c MFr Bld: 8.3 % of total Hgb — ABNORMAL HIGH (ref <5.7)
Mean Plasma Glucose: 192 mg/dL
eAG (mmol/L): 10.6 mmol/L

## 2021-07-13 ENCOUNTER — Telehealth: Payer: Self-pay | Admitting: Physical Medicine and Rehabilitation

## 2021-07-13 ENCOUNTER — Telehealth: Payer: Self-pay

## 2021-07-13 DIAGNOSIS — R251 Tremor, unspecified: Secondary | ICD-10-CM

## 2021-07-13 NOTE — Telephone Encounter (Signed)
Patient returned call asked for a call back to schedule an appointment with Dr. Newton 

## 2021-07-13 NOTE — Telephone Encounter (Signed)
Scheduled for OV 8/17 at 1030. Patient cannot have MRI.

## 2021-07-13 NOTE — Telephone Encounter (Signed)
Patient is calling in checking the status of the referral for Dr.Tat for Neurology. Didn't see referral -please advise.

## 2021-07-14 NOTE — Telephone Encounter (Signed)
Unable to reach patient, [facility]

## 2021-07-15 NOTE — Telephone Encounter (Signed)
Referral has been placed. 

## 2021-07-15 NOTE — Addendum Note (Signed)
Addended by: Clyde Lundborg A on: 07/15/2021 01:59 PM   Modules accepted: Orders

## 2021-07-15 NOTE — Addendum Note (Signed)
Addended by: Clyde Lundborg A on: 07/15/2021 01:57 PM   Modules accepted: Orders

## 2021-07-17 NOTE — Telephone Encounter (Signed)
Pt called back after receiving a call. He said that there was no need to call back because he will be outside

## 2021-07-20 ENCOUNTER — Encounter: Payer: Medicare HMO | Admitting: Physical Therapy

## 2021-07-20 ENCOUNTER — Encounter: Payer: Self-pay | Admitting: Neurology

## 2021-07-20 ENCOUNTER — Other Ambulatory Visit: Payer: Self-pay | Admitting: Family Medicine

## 2021-07-22 ENCOUNTER — Encounter: Payer: Self-pay | Admitting: Physical Medicine and Rehabilitation

## 2021-07-22 ENCOUNTER — Ambulatory Visit (INDEPENDENT_AMBULATORY_CARE_PROVIDER_SITE_OTHER): Payer: Medicare HMO | Admitting: Physical Medicine and Rehabilitation

## 2021-07-22 ENCOUNTER — Other Ambulatory Visit: Payer: Self-pay

## 2021-07-22 ENCOUNTER — Telehealth: Payer: Self-pay | Admitting: Physical Medicine and Rehabilitation

## 2021-07-22 VITALS — BP 85/57 | HR 80

## 2021-07-22 DIAGNOSIS — M47816 Spondylosis without myelopathy or radiculopathy, lumbar region: Secondary | ICD-10-CM | POA: Diagnosis not present

## 2021-07-22 DIAGNOSIS — G8929 Other chronic pain: Secondary | ICD-10-CM | POA: Diagnosis not present

## 2021-07-22 DIAGNOSIS — M545 Low back pain, unspecified: Secondary | ICD-10-CM | POA: Diagnosis not present

## 2021-07-22 NOTE — Progress Notes (Signed)
Pain across low back. Sometimes pain worse on right. No pain with sitting. Pain increases with activity.  Numeric Pain Rating Scale and Functional Assessment Average Pain 7 Pain Right Now 2 My pain is intermittent, dull, and aching Pain is worse with: walking, standing, and some activites Pain improves with: rest   In the last MONTH (on 0-10 scale) has pain interfered with the following?  1. General activity like being  able to carry out your everyday physical activities such as walking, climbing stairs, carrying groceries, or moving a chair?  Rating(5)  2. Relation with others like being able to carry out your usual social activities and roles such as  activities at home, at work and in your community. Rating(5)  3. Enjoyment of life such that you have  been bothered by emotional problems such as feeling anxious, depressed or irritable?  Rating(1)

## 2021-07-22 NOTE — Telephone Encounter (Signed)
Needs auth for bilateral L3-4, L4-5, L5-S1 MBB #1. Patient is scheduled for 8/25 pending auth. We need to call patient if there is any problem with auth before 8/5.

## 2021-07-22 NOTE — Progress Notes (Signed)
Carl Wood - 80 y.o. male MRN [id number]  Date of birth: July 08, 1941  Office Visit Note: Visit Date: 07/22/2021 PCP: Marin Olp, MD Referred by: Marin Olp, MD  Subjective: Chief Complaint  Patient presents with   Lower Back - Pain   HPI: Carl Wood is a 80 y.o. male who comes in today At the request of Dr. Teresa Coombs for evaluation and management of chronic worsening severe axial low back pain sometimes worse right than left but bilateral.  He is present with his wife who provides some of the history.  He is followed by his primary care physician Dr. Yong Channel who is actually manage some of this initially.  He reports ongoing back pain for some years but just progressive worsening to the point where he is not able to stand and do activities for any length of time.  Almost all of his pain is with standing and walking and he gets no pain at rest or sitting.  He denies any real claudication symptoms although Dr. Teresa Coombs thought he was having some type of symptoms with a feeling of weakness in the legs.  He does not endorse that today and really talks about axial back pain across the lower back.  He reports that if he standing or walking and has something to support he does feel much better.  He has an equivocally positive grocery cart sign he does get better with some forward flexion.  His average pain is a 7 out of 10 and really limiting his daily activities when it is there.  He reports sitting today at 2 out of 10 pain.  This is an intermittent dull and aching pain.  He has used mainly Tylenol for pain relief.  He has a pretty extensive cardiac history with history of left bundle branch block heart failure and does have implanted defibrillator.  His medical history is also complicated by COPD as well as type 2 diabetes.  Last hemoglobin A1c was in the 7 range.  He is on Xarelto anticoagulation as well.  It was felt that he is unable to have MRI imaging.  I do have  x-rays of the lumbar spine to review.  I did review this with the patient today using imaging and spine model.  He basically has multilevel facet arthropathy with otherwise normal anatomic alignment some degenerative disc height loss also in general.  No real significant hip disease although some mild right degenerative joint disease.  His wife indicates that the defibrillator is a fairly new one and this may or may not be MRI compatible.  He has not had lumbar CT scan or myelogram.  He has had extensive OMT treatment through Dr. Paulla Fore as well as extensive physical therapy and all of those notes are in the chart to review.  He has been doing the home exercise as directed through physical therapy.  He reports some initial help at times getting more strength involved but still has a lot of pain with standing that just did not go away with therapy.  Review of Systems  Musculoskeletal:  Positive for back pain.  All other systems reviewed and are negative. Otherwise per HPI.  Assessment & Plan: Visit Diagnoses:    ICD-10-CM   1. Spondylosis without myelopathy or radiculopathy, lumbar region  M47.816     2. Chronic bilateral low back pain without sciatica  M54.50    G89.29        Plan: Findings:  Chronic worsening  severe axial low back pain worse with standing and facet loading on exam and very consistent with facet mediated low back pain.  Could not rule out some component of a lumbar stenosis which we would only probably fine with CT scan or MRI for could be done.  He does not have any red flag complaints but is medically complicated.  I do think his pain is facet joint mediated pain.  We are going to try to get authorization for bilateral medial branch blocks at L3-4 and L4-5 and L5-S1.  This will be done with double block paradigm and pain diary.  If he does well with those we would look at radiofrequency ablation procedure.  The ablation can be done with indwelling pacemaker and defibrillator.  If he  does not get relief diagnostically with medial branch blocks would probably look at CT scan of the lumbar spine versus MRI for could be done.  Discussed this at length today with the patient.  Reassured him about doing any of the procedures with his heart history and anticoagulation and that should be fine.  He will continue with home exercises.   Meds & Orders: No orders of the defined types were placed in this encounter.  No orders of the defined types were placed in this encounter.   Follow-up: Return for Bilateral medial branch block L3-4, L4-5 and L5-S1.   Procedures: No procedures performed      Clinical History: LUMBAR SPINE - COMPLETE 4+ VIEW   COMPARISON:  08/16/2019   FINDINGS: Frontal, bilateral oblique, lateral views of the lumbar spine are obtained. There are 5 non-rib-bearing lumbar type vertebral bodies in normal anatomic alignment. No acute displaced fractures. Diffuse lumbar spondylosis and facet hypertrophy is noted, greatest from L3 through S1. Sacroiliac joints are normal.   IMPRESSION: 1. Multilevel spondylosis and facet hypertrophy without significant change since prior exam.     Electronically Signed   By: Randa Ngo M.D.   On: 05/21/2021 09:19   He reports that he quit smoking about 8 years ago. His smoking use included cigarettes. He has a 50.00 pack-year smoking history. He has never used smokeless tobacco.  Recent Labs    11/06/20 1428 03/10/21 1351 07/10/21 1652  HGBA1C 7.4* 8.1* 8.3*    Objective:  VS:  HT:    WT:   BMI:     BP:(!) 85/57  HR:80bpm  TEMP: ( )  RESP:  Physical Exam Vitals and nursing note reviewed.  Constitutional:      General: He is not in acute distress.    Appearance: Normal appearance. He is not ill-appearing.  HENT:     Head: Normocephalic and atraumatic.     Right Ear: External ear normal.     Left Ear: External ear normal.     Nose: No congestion.  Eyes:     Extraocular Movements: Extraocular movements  intact.  Cardiovascular:     Rate and Rhythm: Normal rate.     Pulses: Normal pulses.  Pulmonary:     Effort: Pulmonary effort is normal. No respiratory distress.  Abdominal:     General: There is no distension.     Palpations: Abdomen is soft.  Musculoskeletal:        General: Tenderness present. No signs of injury.     Cervical back: Neck supple.     Right lower leg: No edema.     Left lower leg: No edema.     Comments: He is slow to rise from a seated  position and does have pain with extension and facet loading of the lumbar spine.  Patient has good distal strength without clonus.  He has no pain with hip rotation and no pain over the greater trochanters.  He has some tenderness to palpation of the quadratus lumborum and paraspinal musculature but no frank trigger point.  Skin:    Findings: No erythema or rash.  Neurological:     General: No focal deficit present.     Mental Status: He is alert and oriented to person, place, and time.     Cranial Nerves: No cranial nerve deficit.     Sensory: No sensory deficit.     Motor: No weakness or abnormal muscle tone.     Coordination: Coordination normal.     Gait: Gait normal.  Psychiatric:        Mood and Affect: Mood normal.        Behavior: Behavior normal.    Ortho Exam  Imaging: No results found.  Past Medical/Family/Surgical/Social History: Medications & Allergies reviewed per EMR, new medications updated. Patient Active Problem List   Diagnosis Date Noted   COPD (chronic obstructive pulmonary disease) ([place]) 12/26/2018   Osteoarthritis of right knee 09/22/2018   History of subdural hematoma 11/21/2017   Frontal headache 10/06/2017   Rhinitis 08/22/2017   Dyspnea on exertion 08/22/2017   Ischemic cardiomyopathy 03/28/2017   Chronic atrial fibrillation (H[facility])    OSA (obstructive sleep apnea) [id number]   Chronic systolic heart failure ([place]) 08/06/2015   LBBB (left bundle branch block) 08/06/2015   Carotid artery  stenosis 03/17/2015   Former smoker 11/15/2014   Actinic keratosis 11/15/2014   History of cardioembolic cerebrovascular accident (CVA) 12/19/2012   Benign neoplasm of colon 12/11/2012   Implantable cardioverter-defibrillator (ICD) in situ 02/17/2012    ventricular tachycardia-non sustained  02/17/2012   History of skin cancer 07/05/2007   Diabetes mellitus type II, controlled ([place]) 10/16/2006   Hyperlipidemia associated with type 2 diabetes mellitus ([place]) 10/16/2006   Hypertension associated with diabetes ([place]) 10/16/2006   CAD (coronary artery disease) 10/16/2006   Chronic pulmonary embolism ([place]) 10/16/2006   Past Medical History:  Diagnosis Date   CAD (coronary artery disease)    CHF (congestive heart failure) ([place])    Chronic atrial fibrillation ([place])    Colon polyps    Diabetes mellitus    Diverticulosis of colon    DIVERTICULOSIS, COLON 10/16/2006   Qualifier: Diagnosis of  By: Leanne Chang MD, Bruce     Excessive daytime sleepiness 02/19/2016   Hyperlipidemia    Hypertension    MI (mitral incompetence)    Nephrolithiasis    NEPHROLITHIASIS 06/26/2008   Qualifier: Diagnosis of  By: Sherlynn Stalls, CMA, Cindy     OSA (obstructive sleep apnea) 02/19/2016   Moderate to severe OSA with an AHI of 25/hr and on CPAP at 8cm H2O   PE (pulmonary embolism)    V-tach ([place])    Family History  Problem Relation Age of Onset   Colon cancer Father    Diabetes Father    Heart disease Father    Bladder Cancer Mother    Heart disease Mother    Heart attack Child 55   Heart attack Child 74   Past Surgical History:  Procedure Laterality Date   BACK SURGERY     BASAL CELL CARCINOMA EXCISION     nose   BIV UPGRADE N/A 03/28/2017   Procedure: BiV Upgrade;  Surgeon: Deboraha Sprang, MD;  Location: [facility];  Service: Cardiovascular;  Laterality: N/A;   CARDIAC CATHETERIZATION N/A 01/20/2016   Procedure: Right/Left Heart Cath and Coronary Angiography;  Surgeon: Jolaine Artist, MD;   Location: [facility];  Service: Cardiovascular;  Laterality: N/A;   CARDIAC DEFIBRILLATOR PLACEMENT     medtronic virtuoso   CHOLECYSTECTOMY     COLONOSCOPY  12/11/2012   Procedure: COLONOSCOPY;  Surgeon: Inda Castle, MD;  Location: [facility];  Service: Endoscopy;  Laterality: N/A;   KNEE ARTHROPLASTY     Social History   Occupational History   Occupation: Retired    Fish farm manager: RETIRED  Tobacco Use   Smoking status: Former    Packs/day: 1.00    Years: 50.00    Pack years: 50.00    Types: Cigarettes    Quit date: 12/15/2012    Years since quitting: 8.6   Smokeless tobacco: Never  Vaping Use   Vaping Use: Never used  Substance and Sexual Activity   Alcohol use: No    Alcohol/week: 0.0 standard drinks   Drug use: No   Sexual activity: Yes    Birth control/protection: None

## 2021-07-23 ENCOUNTER — Encounter: Payer: Medicare HMO | Admitting: Physical Therapy

## 2021-07-23 DIAGNOSIS — H52203 Unspecified astigmatism, bilateral: Secondary | ICD-10-CM | POA: Diagnosis not present

## 2021-07-23 DIAGNOSIS — E119 Type 2 diabetes mellitus without complications: Secondary | ICD-10-CM | POA: Diagnosis not present

## 2021-07-23 DIAGNOSIS — H2513 Age-related nuclear cataract, bilateral: Secondary | ICD-10-CM | POA: Diagnosis not present

## 2021-07-23 LAB — HM DIABETES EYE EXAM

## 2021-07-27 ENCOUNTER — Encounter: Payer: Self-pay | Admitting: Family Medicine

## 2021-07-27 ENCOUNTER — Telehealth (HOSPITAL_COMMUNITY): Payer: Self-pay | Admitting: *Deleted

## 2021-07-27 NOTE — Telephone Encounter (Signed)
Pt called requesting Xarelto samples as he is in the donut hole. Samples provided, pt will p/u at front desk later today  Medication Samples have been provided to the patient.  Drug name: Xarelto       Strength: '20mg'$         Qty: 4  LOT: [id number]  Exp.Date: 11/24  Dosing instructions: Take 1 tab Daily  The patient has been instructed regarding the correct time, dose, and frequency of taking this medication, including desired effects and most common side effects.   Carl Wood 9:12 AM 07/27/2021

## 2021-07-28 ENCOUNTER — Ambulatory Visit (INDEPENDENT_AMBULATORY_CARE_PROVIDER_SITE_OTHER): Payer: Medicare HMO

## 2021-07-28 DIAGNOSIS — I255 Ischemic cardiomyopathy: Secondary | ICD-10-CM | POA: Diagnosis not present

## 2021-07-28 LAB — CUP PACEART REMOTE DEVICE CHECK
Battery Remaining Longevity: 21 mo
Battery Voltage: 2.92 V
Brady Statistic AP VP Percent: 0 %
Brady Statistic AP VS Percent: 0 %
Brady Statistic AS VP Percent: 0 %
Brady Statistic AS VS Percent: 0 %
Brady Statistic RA Percent Paced: 0 %
Brady Statistic RV Percent Paced: 97.91 %
Date Time Interrogation Session: 20220823033425
HighPow Impedance: 78 Ohm
Implantable Lead Implant Date: 20010723
Implantable Lead Implant Date: 20180423
Implantable Lead Location: 753858
Implantable Lead Location: 753860
Implantable Lead Model: 6943
Implantable Pulse Generator Implant Date: 20180423
Lead Channel Impedance Value: 1007 Ohm
Lead Channel Impedance Value: 266 Ohm
Lead Channel Impedance Value: 266 Ohm
Lead Channel Impedance Value: 266 Ohm
Lead Channel Impedance Value: 270.667
Lead Channel Impedance Value: 270.667
Lead Channel Impedance Value: 285 Ohm
Lead Channel Impedance Value: 399 Ohm
Lead Channel Impedance Value: 4047 Ohm
Lead Channel Impedance Value: 532 Ohm
Lead Channel Impedance Value: 532 Ohm
Lead Channel Impedance Value: 532 Ohm
Lead Channel Impedance Value: 551 Ohm
Lead Channel Impedance Value: 931 Ohm
Lead Channel Impedance Value: 931 Ohm
Lead Channel Impedance Value: 950 Ohm
Lead Channel Impedance Value: 950 Ohm
Lead Channel Impedance Value: 950 Ohm
Lead Channel Pacing Threshold Amplitude: 1.5 V
Lead Channel Pacing Threshold Amplitude: 2 V
Lead Channel Pacing Threshold Pulse Width: 0.4 ms
Lead Channel Pacing Threshold Pulse Width: 1 ms
Lead Channel Sensing Intrinsic Amplitude: 10.25 mV
Lead Channel Sensing Intrinsic Amplitude: 10.25 mV
Lead Channel Setting Pacing Amplitude: 2.5 V
Lead Channel Setting Pacing Amplitude: 3.25 V
Lead Channel Setting Pacing Pulse Width: 0.4 ms
Lead Channel Setting Pacing Pulse Width: 1 ms
Lead Channel Setting Sensing Sensitivity: 0.3 mV

## 2021-07-30 ENCOUNTER — Ambulatory Visit: Payer: Self-pay

## 2021-07-30 ENCOUNTER — Encounter: Payer: Self-pay | Admitting: Physical Medicine and Rehabilitation

## 2021-07-30 ENCOUNTER — Encounter: Payer: Medicare HMO | Admitting: Physical Therapy

## 2021-07-30 ENCOUNTER — Other Ambulatory Visit: Payer: Self-pay

## 2021-07-30 ENCOUNTER — Ambulatory Visit (INDEPENDENT_AMBULATORY_CARE_PROVIDER_SITE_OTHER): Payer: Medicare HMO | Admitting: Physical Medicine and Rehabilitation

## 2021-07-30 VITALS — BP 92/62 | HR 85

## 2021-07-30 DIAGNOSIS — M47816 Spondylosis without myelopathy or radiculopathy, lumbar region: Secondary | ICD-10-CM

## 2021-07-30 MED ORDER — BUPIVACAINE HCL 0.5 % IJ SOLN
3.0000 mL | Freq: Once | INTRAMUSCULAR | Status: AC
  Administered 2021-07-30: 3 mL

## 2021-07-30 NOTE — Patient Instructions (Signed)

## 2021-07-30 NOTE — Progress Notes (Signed)
Pt state lower back pain. Pt state walking, standing and bending makes the pain worse. Pt state he sit down to help ease his pain.  Numeric Pain Rating Scale and Functional Assessment Average Pain 2    In the last MONTH (on 0-10 scale) has pain interfered with the following?  1. General activity like being  able to carry out your everyday physical activities such as walking, climbing stairs, carrying groceries, or moving a chair?  Rating(9)   +Driver, +BT, -Dye Allergies.

## 2021-07-30 NOTE — Progress Notes (Signed)
Carl Wood - 80 y.o. male MRN [id number]  Date of birth: [birth year]  Office Visit Note: Visit Date: 07/30/2021 PCP: Marin Olp, MD Referred by: Marin Olp, MD  Subjective: Chief Complaint  Patient presents with   Lower Back - Pain   HPI:  Carl Wood is a 80 y.o. male who comes in today for planned Bilateral  L3-L4, L4-L5, and L5-S1 Lumbar facet/medial branch block with fluoroscopic guidance.  The patient has failed conservative care including home exercise, medications, time and activity modification.  This injection will be diagnostic and hopefully therapeutic.  Please see requesting physician notes for further details and justification.  Exam has shown concordant pain with facet joint loading.  ROS Otherwise per HPI.  Assessment & Plan: Visit Diagnoses:    ICD-10-CM   1. Spondylosis without myelopathy or radiculopathy, lumbar region  M47.816 XR C-ARM NO REPORT    Facet Injection    bupivacaine (MARCAINE) 0.5 % (with pres) injection 3 mL      Plan: No additional findings.   Meds & Orders:  Meds ordered this encounter  Medications   bupivacaine (MARCAINE) 0.5 % (with pres) injection 3 mL    Orders Placed This Encounter  Procedures   Facet Injection   XR C-ARM NO REPORT    Follow-up: Return if symptoms worsen or fail to improve.   Procedures: No procedures performed  Lumbar Diagnostic Facet Joint Nerve Block with Fluoroscopic Guidance   Patient: Carl Wood      Date of Birth: [birth year] MRN: [id number] PCP: Marin Olp, MD      Visit Date: 07/30/2021   Universal Protocol:    Date/Time: 08/25/224:43 PM  Consent Given By: the patient  Position: PRONE  Additional Comments: Vital signs were monitored before and after the procedure. Patient was prepped and draped in the usual sterile fashion. The correct patient, procedure, and site was verified.   Injection Procedure Details:   Procedure diagnoses:  1. Spondylosis  without myelopathy or radiculopathy, lumbar region      Meds Administered:  Meds ordered this encounter  Medications   bupivacaine (MARCAINE) 0.5 % (with pres) injection 3 mL     Laterality: Bilateral  Location/Site: L3-L4, L2 and L3 medial branches, L4-L5, L3 and L4 medial branches, and L5-S1, L4 medial branch and L5 dorsal ramus  Needle: 5.0 in., 25 ga.  Short bevel or Quincke spinal needle  Needle Placement: Oblique pedical  Findings:   -Comments: There was excellent flow of contrast along the articular pillars without intravascular flow.  Procedure Details: The fluoroscope beam is vertically oriented in AP and then obliqued 15 to 20 degrees to the ipsilateral side of the desired nerve to achieve the "Scotty dog" appearance.  The skin over the target area of the junction of the superior articulating process and the transverse process (sacral ala if blocking the L5 dorsal rami) was locally anesthetized with a 1 ml volume of 1% Lidocaine without Epinephrine.  The spinal needle was inserted and advanced in a trajectory view down to the target.   After contact with periosteum and negative aspirate for blood and CSF, correct placement without intravascular or epidural spread was confirmed by injecting 0.5 ml. of Isovue-250.  A spot radiograph was obtained of this image.    Next, a 0.5 ml. volume of the injectate described above was injected. The needle was then redirected to the other facet joint nerves mentioned above if needed.  Prior to the procedure, the patient  was given a Pain Diary which was completed for baseline measurements.  After the procedure, the patient rated their pain every 30 minutes and will continue rating at this frequency for a total of 5 hours.  The patient has been asked to complete the Diary and return to [place] by mail, fax or hand delivered as soon as possible.   Additional Comments:  The patient tolerated the procedure well Dressing: 2 x 2 sterile gauze and  Band-Aid    Post-procedure details: Patient was observed during the procedure. Post-procedure instructions were reviewed.  Patient left the clinic in stable condition.    Clinical History: LUMBAR SPINE - COMPLETE 4+ VIEW   COMPARISON:  08/16/2019   FINDINGS: Frontal, bilateral oblique, lateral views of the lumbar spine are obtained. There are 5 non-rib-bearing lumbar type vertebral bodies in normal anatomic alignment. No acute displaced fractures. Diffuse lumbar spondylosis and facet hypertrophy is noted, greatest from L3 through S1. Sacroiliac joints are normal.   IMPRESSION: 1. Multilevel spondylosis and facet hypertrophy without significant change since prior exam.     Electronically Signed   By: Randa Ngo M.D.   On: 05/21/2021 09:19     Objective:  VS:  HT:    WT:   BMI:     BP:92/62  HR:85bpm  TEMP: ( )  RESP:  Physical Exam Vitals and nursing note reviewed.  Constitutional:      General: He is not in acute distress.    Appearance: Normal appearance. He is not ill-appearing.  HENT:     Head: Normocephalic and atraumatic.     Right Ear: External ear normal.     Left Ear: External ear normal.     Nose: No congestion.  Eyes:     Extraocular Movements: Extraocular movements intact.  Cardiovascular:     Rate and Rhythm: Normal rate.     Pulses: Normal pulses.  Pulmonary:     Effort: Pulmonary effort is normal. No respiratory distress.  Abdominal:     General: There is no distension.     Palpations: Abdomen is soft.  Musculoskeletal:        General: No tenderness or signs of injury.     Cervical back: Neck supple.     Right lower leg: No edema.     Left lower leg: No edema.     Comments: Patient has good distal strength without clonus. Patient somewhat slow to rise from a seated position to full extension.  There is concordant low back pain with facet loading and lumbar spine extension rotation.  There are no definitive trigger points but the  patient is somewhat tender across the lower back and PSIS.  There is no pain with hip rotation.   Skin:    Findings: No erythema or rash.  Neurological:     General: No focal deficit present.     Mental Status: He is alert and oriented to person, place, and time.     Sensory: No sensory deficit.     Motor: No weakness or abnormal muscle tone.     Coordination: Coordination normal.  Psychiatric:        Mood and Affect: Mood normal.        Behavior: Behavior normal.     Imaging: XR C-ARM NO REPORT  Result Date: 07/30/2021 Please see Notes tab for imaging impression.

## 2021-07-30 NOTE — Procedures (Signed)
Lumbar Diagnostic Facet Joint Nerve Block with Fluoroscopic Guidance   Patient: Carl Wood      Date of Birth: 1940/12/22 MRN: [id number] PCP: Marin Olp, MD      Visit Date: 07/30/2021   Universal Protocol:    Date/Time: 08/25/224:43 PM  Consent Given By: the patient  Position: PRONE  Additional Comments: Vital signs were monitored before and after the procedure. Patient was prepped and draped in the usual sterile fashion. The correct patient, procedure, and site was verified.   Injection Procedure Details:   Procedure diagnoses:  1. Spondylosis without myelopathy or radiculopathy, lumbar region      Meds Administered:  Meds ordered this encounter  Medications   bupivacaine (MARCAINE) 0.5 % (with pres) injection 3 mL     Laterality: Bilateral  Location/Site: L3-L4, L2 and L3 medial branches, L4-L5, L3 and L4 medial branches, and L5-S1, L4 medial branch and L5 dorsal ramus  Needle: 5.0 in., 25 ga.  Short bevel or Quincke spinal needle  Needle Placement: Oblique pedical  Findings:   -Comments: There was excellent flow of contrast along the articular pillars without intravascular flow.  Procedure Details: The fluoroscope beam is vertically oriented in AP and then obliqued 15 to 20 degrees to the ipsilateral side of the desired nerve to achieve the "Scotty dog" appearance.  The skin over the target area of the junction of the superior articulating process and the transverse process (sacral ala if blocking the L5 dorsal rami) was locally anesthetized with a 1 ml volume of 1% Lidocaine without Epinephrine.  The spinal needle was inserted and advanced in a trajectory view down to the target.   After contact with periosteum and negative aspirate for blood and CSF, correct placement without intravascular or epidural spread was confirmed by injecting 0.5 ml. of Isovue-250.  A spot radiograph was obtained of this image.    Next, a 0.5 ml. volume of the injectate  described above was injected. The needle was then redirected to the other facet joint nerves mentioned above if needed.  Prior to the procedure, the patient was given a Pain Diary which was completed for baseline measurements.  After the procedure, the patient rated their pain every 30 minutes and will continue rating at this frequency for a total of 5 hours.  The patient has been asked to complete the Diary and return to [place] by mail, fax or hand delivered as soon as possible.   Additional Comments:  The patient tolerated the procedure well Dressing: 2 x 2 sterile gauze and Band-Aid    Post-procedure details: Patient was observed during the procedure. Post-procedure instructions were reviewed.  Patient left the clinic in stable condition.

## 2021-07-31 NOTE — Progress Notes (Signed)
Assessment/Plan:   1.  Right hand chorea  -It is really quite mild, and it is difficult to tell it is even chorea until he starts to walk in the typical "piano playing" movements are noted in the fingers of the right hand.  They think it has been going on ever since his stroke, and it likely has.  He has a basal ganglia lesion, which certainly can cause chorea.  -Talked about repeating CT brain, but patient really does not want to do that.  He said symptoms really have not changed over many years and he is not bothered by them.  -Reassured patient that I saw no evidence of Parkinson's disease.  2.  Neck pain  -Patient has very significant limited motion of the neck.  I do not see any dystonia, but I do worry about possible myelopathy (given complaints about weakness in the legs).  However, he has no other features of myelopathy.  We talked about doing CT of the cervical spine and he declines for now but does state that he will talk to his other physicians who are treating this.  He sees Dr. Lucia Gaskins and Dr. Paulla Fore for this as well.  3.  Lower extremity weakness  -I did not note this on exam, but he did state he is having trouble getting up from chairs because of weakness.  We talked about an EMG and CPK to rule out myopathy given he is on a statin.  He does not want to do that.  Again, he plans on talking to his other physicians about this.   Subjective:   Carl Wood was seen today in the movement disorders clinic for neurologic consultation at the request of Marin Olp, MD.  The consultation is for the evaluation of pill-rolling tremor and to rule out Parkinson's disease.  Prior records made available to me.  Patient has seen Dr. Leonie Man in the past.  He has also seen Dr. Tomi Likens for headache.  When Dr. Tomi Likens saw the patient in 2018, he did neuroimaging, which demonstrated subdural hematomas, chronic.  His Xarelto was held for short period of time, but he ultimately did resume the  medication.  In regards to tremor, patient saw Dr. Paulla Fore who noted pill-rolling tremor (pt doesn't even call it tremor) and bradykinesia.   Patient thinks that he has had tremor ever since an embolic stroke years ago.  No mention of tremor is made in Dr. Georgie Chard notes in 2019 but they state that the NP at [facility] told him it was stroke related.  Wife agrees with patient that he has had the movement ever since his stroke.  Pt states that it is mostly in the L hand and it is not a tremor.  States that he rubs the thumb and finger together and has for many years.  Wife, however, does state that he has had the movements in the right hand ever since his stroke.  She states that in the left hand, he cut his finger years ago and started to rub the finger and thumb together and she thinks that is habit now.  This patient is accompanied in the office by his spouse who supplements the history.  Tremor: pt doesn't call it tremor - calls it rolling the thumb and finger together.    Fam hx of tremor?  No.  Located where?  L hand mostly  Affected by caffeine:  No.  Affected by alcohol:  No.  Affected by stress:  No.  Affects ADL's (tying shoes, brushing teeth, etc):  No.  Tremor inducing meds:  No.  Other Specific Symptoms:  Voice: wife thinks he has gotten a bit softer, but also states always been quiet Sleep: sleeps well  Vivid Dreams:  No.  Acting out dreams:  No. Postural symptoms: states its "fairly good"  Falls?  No., none in recent years Bradykinesia symptoms: difficulty getting out of a chair (attribues to "weak legs"); no shuffle Loss of smell:  No. Loss of taste:  No. Urinary Incontinence:  No., has frequency and urgency Difficulty Swallowing:  No. Depression:  No. Memory changes:  No. N/V:  No. Lightheaded:  Yes.    Syncope: No. Diplopia:  No.  Prior CT of the brain done years ago was reviewed.  ALLERGIES:   Allergies  Allergen Reactions   Penicillins Other (See Comments)     Patient passed out   Sulfamethoxazole Rash    CURRENT MEDICATIONS:  Current Outpatient Medications  Medication Instructions   ACCU-CHEK AVIVA PLUS test strip USE  STRIP TO CHECK GLUCOSE ONCE DAILY   Ascorbic Acid (VITAMIN C PO) 1 tablet, Oral   carvedilol (COREG) 12.5 MG tablet TAKE 1 TABLET BY MOUTH TWICE DAILY WITH A MEAL   digoxin (LANOXIN) 0.25 MG tablet Take 1/2 (one-half) tablet by mouth once daily   furosemide (LASIX) 20 MG tablet Take 1 tablet by mouth once a week   glimepiride (AMARYL) 4 MG tablet TAKE 2 TABLETS BY MOUTH ONCE DAILY WITH BREAKFAST   JARDIANCE 10 MG TABS tablet Take 1 tablet by mouth once daily   metFORMIN (GLUCOPHAGE-XR) 1,000 mg, Oral, 2 times daily   rivaroxaban (XARELTO) 20 mg, Oral, Daily   rosuvastatin (CRESTOR) 40 MG tablet No dose, route, or frequency recorded.   sacubitril-valsartan (ENTRESTO) 49-51 MG 1 tablet, Oral, 2 times daily    Objective:   PHYSICAL EXAMINATION:    VITALS:   Vitals:   08/04/21 0827  BP: 116/62  Pulse: 85  SpO2: 93%  Weight: 207 lb (93.9 kg)  Height: '5\' 11"'$  (1.803 m)    GEN:  The patient appears stated age and is in NAD. HEENT:  Normocephalic, atraumatic.  The mucous membranes are moist. The superficial temporal arteries are without ropiness or tenderness. CV:  RRR Lungs:  CTAB Neck/HEME:  There are no carotid bruits bilaterally.  He has very limited range of motion of the neck and has difficulty rotating it both right and left.  Neurological examination:  Orientation: The patient is alert and oriented x3.  Cranial nerves: There is good facial symmetry.  Extraocular muscles are intact. The visual fields are full to confrontational testing. The speech is fluent and clear. Soft palate rises symmetrically and there is no tongue deviation. Hearing is intact to conversational tone. Sensation: Sensation is intact to light touch throughout (facial, trunk, extremities).  Motor: Strength is 5/5 in the bilateral upper and  lower extremities.   Shoulder shrug is equal and symmetric.  There is no pronator drift. Deep tendon reflexes: Deep tendon reflexes are 2/4 at the bilateral biceps, triceps, brachioradialis, patella. Plantar responses are downgoing bilaterally.  Movement examination: Tone: There is normal tone in the bilateral upper extremities.  The tone in the lower extremities is normal.  Abnormal movements: Patient does have very mild chorea of the fingers of the right hand.  He does slightly rub the left thumb and finger together, but that does not particularly appear choreiform.  Most of the chorea is noted in the hand on  the right when he ambulates. Coordination:  There is no decremation with RAM's, with any form of RAMS, including alternating supination and pronation of the forearm, hand opening and closing, finger taps, heel taps and toe taps Gait and Station: The patient pushes off of the chair to arise.  He ambulates fairly well in the hall.  No shuffling.   I have reviewed and interpreted the following labs independently   Chemistry      Component Value Date/Time   NA 139 07/10/2021 1652   NA 139 03/23/2017 0000   K 4.5 07/10/2021 1652   CL 104 07/10/2021 1652   CO2 25 07/10/2021 1652   BUN 18 07/10/2021 1652   BUN 15 03/23/2017 0000   CREATININE 0.94 07/10/2021 1652      Component Value Date/Time   CALCIUM 9.6 07/10/2021 1652   ALKPHOS 64 03/10/2021 1351   AST 16 07/10/2021 1652   ALT 17 07/10/2021 1652   BILITOT 0.7 07/10/2021 1652      Lab Results  Component Value Date   TSH 2.38 09/25/2019   Lab Results  Component Value Date   WBC 6.3 07/10/2021   HGB 15.6 07/10/2021   HCT 47.0 07/10/2021   MCV 97.9 07/10/2021   PLT 163 07/10/2021      Total time spent on today's visit was 45 minutes, including both face-to-face time and nonface-to-face time.  Time included that spent on review of records (prior notes available to me/labs/imaging if pertinent), discussing treatment and  goals, answering patient's questions and coordinating care.  Cc:  Marin Olp, MD

## 2021-08-03 ENCOUNTER — Encounter: Payer: Medicare HMO | Admitting: Physical Therapy

## 2021-08-04 ENCOUNTER — Other Ambulatory Visit: Payer: Self-pay

## 2021-08-04 ENCOUNTER — Ambulatory Visit: Payer: Medicare HMO | Admitting: Neurology

## 2021-08-04 ENCOUNTER — Encounter: Payer: Self-pay | Admitting: Neurology

## 2021-08-04 ENCOUNTER — Telehealth: Payer: Self-pay | Admitting: Physical Medicine and Rehabilitation

## 2021-08-04 VITALS — BP 116/62 | HR 85 | Ht 71.0 in | Wt 207.0 lb

## 2021-08-04 DIAGNOSIS — M542 Cervicalgia: Secondary | ICD-10-CM | POA: Diagnosis not present

## 2021-08-04 DIAGNOSIS — G255 Other chorea: Secondary | ICD-10-CM

## 2021-08-04 NOTE — Patient Instructions (Signed)
Call us if your symptoms of the movement of the hand gets worse or new symptoms arise We talked about doing EMG test to look at weakness of legs.  For now, you are wanting to hold on that and discuss with your other doctors We discussed CT neck.  For now, you are wanting to hold on that and discuss with your other doctors It was good to see you today! The physicians and staff at [facility] are committed to providing excellent care. You may receive a survey requesting feedback about your experience at our office. We strive to receive "very good" responses to the survey questions. If you feel that your experience would prevent you from giving the office a "very good " response, please contact our office to try to remedy the situation. We may be reached at [phone]. Thank you for taking the time out of your busy day to complete the survey.

## 2021-08-04 NOTE — Telephone Encounter (Signed)
Patient called. Says he faxed a form to Dr. Ernestina Patches. Would like to know that his next steps are. His call back number is [phone]

## 2021-08-04 NOTE — Telephone Encounter (Signed)
Did we receive a pain diary?

## 2021-08-06 ENCOUNTER — Encounter: Payer: Medicare HMO | Admitting: Physical Therapy

## 2021-08-12 NOTE — Progress Notes (Signed)
Remote ICD transmission.   

## 2021-08-18 DIAGNOSIS — J0100 Acute maxillary sinusitis, unspecified: Secondary | ICD-10-CM | POA: Diagnosis not present

## 2021-08-18 DIAGNOSIS — Z20828 Contact with and (suspected) exposure to other viral communicable diseases: Secondary | ICD-10-CM | POA: Diagnosis not present

## 2021-08-18 DIAGNOSIS — J209 Acute bronchitis, unspecified: Secondary | ICD-10-CM | POA: Diagnosis not present

## 2021-08-19 ENCOUNTER — Ambulatory Visit (INDEPENDENT_AMBULATORY_CARE_PROVIDER_SITE_OTHER): Payer: Medicare HMO | Admitting: Physical Medicine and Rehabilitation

## 2021-08-19 ENCOUNTER — Other Ambulatory Visit: Payer: Self-pay

## 2021-08-19 ENCOUNTER — Encounter: Payer: Self-pay | Admitting: Physical Medicine and Rehabilitation

## 2021-08-19 ENCOUNTER — Ambulatory Visit: Payer: Self-pay

## 2021-08-19 VITALS — BP 96/63 | HR 84

## 2021-08-19 DIAGNOSIS — M47816 Spondylosis without myelopathy or radiculopathy, lumbar region: Secondary | ICD-10-CM | POA: Diagnosis not present

## 2021-08-19 MED ORDER — BUPIVACAINE HCL 0.5 % IJ SOLN
3.0000 mL | Freq: Once | INTRAMUSCULAR | Status: AC
  Administered 2021-08-19: 3 mL

## 2021-08-19 NOTE — Progress Notes (Signed)
Pt state lower back pain. Pt state walking and standing makes the pain worse. Pt state he takes nothing for the pain. Pt has hx of inj on 07/30/21 pt state it helped.  Numeric Pain Rating Scale and Functional Assessment Average Pain 4   In the last MONTH (on 0-10 scale) has pain interfered with the following?  1. General activity like being  able to carry out your everyday physical activities such as walking, climbing stairs, carrying groceries, or moving a chair?  Rating(8)   +Driver, -BT, -Dye Allergies.

## 2021-08-19 NOTE — Patient Instructions (Signed)

## 2021-08-23 NOTE — Procedures (Signed)
Lumbar Diagnostic Facet Joint Nerve Block with Fluoroscopic Guidance   Patient: Carl Wood      Date of Birth: October 23, 1941 MRN: [id number] PCP: Marin Olp, MD      Visit Date: 08/19/2021   Universal Protocol:    Date/Time: 08/24/2211:07 PM  Consent Given By: the patient  Position: PRONE  Additional Comments: Vital signs were monitored before and after the procedure. Patient was prepped and draped in the usual sterile fashion. The correct patient, procedure, and site was verified.   Injection Procedure Details:   Procedure diagnoses:  1. Spondylosis without myelopathy or radiculopathy, lumbar region      Meds Administered:  Meds ordered this encounter  Medications   bupivacaine (MARCAINE) 0.5 % (with pres) injection 3 mL     Laterality: Bilateral  Location/Site: L3-L4, L2 and L3 medial branches, L4-L5, L3 and L4 medial branches, and L5-S1, L4 medial branch and L5 dorsal ramus  Needle: 5.0 in., 25 ga.  Short bevel or Quincke spinal needle  Needle Placement: Oblique pedical  Findings:   -Comments: There was excellent flow of contrast along the articular pillars without intravascular flow.  Procedure Details: The fluoroscope beam is vertically oriented in AP and then obliqued 15 to 20 degrees to the ipsilateral side of the desired nerve to achieve the "Scotty dog" appearance.  The skin over the target area of the junction of the superior articulating process and the transverse process (sacral ala if blocking the L5 dorsal rami) was locally anesthetized with a 1 ml volume of 1% Lidocaine without Epinephrine.  The spinal needle was inserted and advanced in a trajectory view down to the target.   After contact with periosteum and negative aspirate for blood and CSF, correct placement without intravascular or epidural spread was confirmed by injecting 0.5 ml. of Isovue-250.  A spot radiograph was obtained of this image.    Next, a 0.5 ml. volume of the injectate  described above was injected. The needle was then redirected to the other facet joint nerves mentioned above if needed.  Prior to the procedure, the patient was given a Pain Diary which was completed for baseline measurements.  After the procedure, the patient rated their pain every 30 minutes and will continue rating at this frequency for a total of 5 hours.  The patient has been asked to complete the Diary and return to [place] by mail, fax or hand delivered as soon as possible.   Additional Comments:  No complications occurred Dressing: 2 x 2 sterile gauze and Band-Aid    Post-procedure details: Patient was observed during the procedure. Post-procedure instructions were reviewed.  Patient left the clinic in stable condition.

## 2021-08-23 NOTE — Progress Notes (Signed)
Carl Wood - 80 y.o. male MRN [id number]  Date of birth: [birth year]  Office Visit Note: Visit Date: 08/19/2021 PCP: Marin Olp, MD Referred by: Marin Olp, MD  Subjective: Chief Complaint  Patient presents with   Lower Back - Pain   HPI:  Carl Wood is a 80 y.o. male who comes in today for planned repeat Bilateral L3-4, L4-5, and L5-S1 Lumbar facet/medial branch block with fluoroscopic guidance.  The patient has failed conservative care including home exercise, medications, time and activity modification.  This injection will be diagnostic and hopefully therapeutic.  Please see requesting physician notes for further details and justification.  Exam shows concordant low back pain with facet joint loading and extension. Patient received more than 80% pain relief from prior injection. This would be the second block in a diagnostic double block paradigm.     Referring:Dr. Teresa Coombs  ROS Otherwise per HPI.  Assessment & Plan: Visit Diagnoses:    ICD-10-CM   1. Spondylosis without myelopathy or radiculopathy, lumbar region  M47.816 XR C-ARM NO REPORT    Facet Injection    bupivacaine (MARCAINE) 0.5 % (with pres) injection 3 mL      Plan: No additional findings.   Meds & Orders:  Meds ordered this encounter  Medications   bupivacaine (MARCAINE) 0.5 % (with pres) injection 3 mL    Orders Placed This Encounter  Procedures   Facet Injection   XR C-ARM NO REPORT    Follow-up: Return for Review Pain Diary.   Procedures: No procedures performed  Lumbar Diagnostic Facet Joint Nerve Block with Fluoroscopic Guidance   Patient: Carl Wood      Date of Birth: 17-Sep-1941 MRN: [id number] PCP: Marin Olp, MD      Visit Date: 08/19/2021   Universal Protocol:    Date/Time: 08/24/2211:07 PM  Consent Given By: the patient  Position: PRONE  Additional Comments: Vital signs were monitored before and after the procedure. Patient was  prepped and draped in the usual sterile fashion. The correct patient, procedure, and site was verified.   Injection Procedure Details:   Procedure diagnoses:  1. Spondylosis without myelopathy or radiculopathy, lumbar region      Meds Administered:  Meds ordered this encounter  Medications   bupivacaine (MARCAINE) 0.5 % (with pres) injection 3 mL     Laterality: Bilateral  Location/Site: L3-L4, L2 and L3 medial branches, L4-L5, L3 and L4 medial branches, and L5-S1, L4 medial branch and L5 dorsal ramus  Needle: 5.0 in., 25 ga.  Short bevel or Quincke spinal needle  Needle Placement: Oblique pedical  Findings:   -Comments: There was excellent flow of contrast along the articular pillars without intravascular flow.  Procedure Details: The fluoroscope beam is vertically oriented in AP and then obliqued 15 to 20 degrees to the ipsilateral side of the desired nerve to achieve the "Scotty dog" appearance.  The skin over the target area of the junction of the superior articulating process and the transverse process (sacral ala if blocking the L5 dorsal rami) was locally anesthetized with a 1 ml volume of 1% Lidocaine without Epinephrine.  The spinal needle was inserted and advanced in a trajectory view down to the target.   After contact with periosteum and negative aspirate for blood and CSF, correct placement without intravascular or epidural spread was confirmed by injecting 0.5 ml. of Isovue-250.  A spot radiograph was obtained of this image.    Next, a 0.5 ml. volume  of the injectate described above was injected. The needle was then redirected to the other facet joint nerves mentioned above if needed.  Prior to the procedure, the patient was given a Pain Diary which was completed for baseline measurements.  After the procedure, the patient rated their pain every 30 minutes and will continue rating at this frequency for a total of 5 hours.  The patient has been asked to complete the  Diary and return to [place] by mail, fax or hand delivered as soon as possible.   Additional Comments:  No complications occurred Dressing: 2 x 2 sterile gauze and Band-Aid    Post-procedure details: Patient was observed during the procedure. Post-procedure instructions were reviewed.  Patient left the clinic in stable condition.   Clinical History: LUMBAR SPINE - COMPLETE 4+ VIEW   COMPARISON:  08/16/2019   FINDINGS: Frontal, bilateral oblique, lateral views of the lumbar spine are obtained. There are 5 non-rib-bearing lumbar type vertebral bodies in normal anatomic alignment. No acute displaced fractures. Diffuse lumbar spondylosis and facet hypertrophy is noted, greatest from L3 through S1. Sacroiliac joints are normal.   IMPRESSION: 1. Multilevel spondylosis and facet hypertrophy without significant change since prior exam.     Electronically Signed   By: Randa Ngo M.D.   On: 05/21/2021 09:19     Objective:  VS:  HT:    WT:   BMI:     BP:96/63  HR:84bpm  TEMP: ( )  RESP:  Physical Exam Vitals and nursing note reviewed.  Constitutional:      General: He is not in acute distress.    Appearance: Normal appearance. He is not ill-appearing.  HENT:     Head: Normocephalic and atraumatic.     Right Ear: External ear normal.     Left Ear: External ear normal.     Nose: No congestion.  Eyes:     Extraocular Movements: Extraocular movements intact.  Cardiovascular:     Rate and Rhythm: Normal rate.     Pulses: Normal pulses.  Pulmonary:     Effort: Pulmonary effort is normal. No respiratory distress.  Abdominal:     General: There is no distension.     Palpations: Abdomen is soft.  Musculoskeletal:        General: No tenderness or signs of injury.     Cervical back: Neck supple.     Right lower leg: No edema.     Left lower leg: No edema.     Comments: Patient has good distal strength without clonus. Patient somewhat slow to rise from a seated position  to full extension.  There is concordant low back pain with facet loading and lumbar spine extension rotation.  There are no definitive trigger points but the patient is somewhat tender across the lower back and PSIS.  There is no pain with hip rotation.   Skin:    Findings: No erythema or rash.  Neurological:     General: No focal deficit present.     Mental Status: He is alert and oriented to person, place, and time.     Sensory: No sensory deficit.     Motor: No weakness or abnormal muscle tone.     Coordination: Coordination normal.  Psychiatric:        Mood and Affect: Mood normal.        Behavior: Behavior normal.     Imaging: No results found.

## 2021-08-31 ENCOUNTER — Telehealth: Payer: Self-pay | Admitting: Physical Medicine and Rehabilitation

## 2021-08-31 ENCOUNTER — Other Ambulatory Visit: Payer: Self-pay | Admitting: Internal Medicine

## 2021-08-31 NOTE — Telephone Encounter (Signed)
Did we receive his second pain diary? If so, can you update him on where this stands?

## 2021-08-31 NOTE — Telephone Encounter (Signed)
Patient called to see when will his next appointment be scheduled  with Dr. Ernestina Patches? The number to contact patient is [phone]

## 2021-09-01 ENCOUNTER — Encounter: Payer: Self-pay | Admitting: Family Medicine

## 2021-09-01 ENCOUNTER — Other Ambulatory Visit: Payer: Self-pay

## 2021-09-01 ENCOUNTER — Ambulatory Visit (INDEPENDENT_AMBULATORY_CARE_PROVIDER_SITE_OTHER): Payer: Medicare HMO

## 2021-09-01 ENCOUNTER — Telehealth: Payer: Self-pay | Admitting: Physical Medicine and Rehabilitation

## 2021-09-01 DIAGNOSIS — Z23 Encounter for immunization: Secondary | ICD-10-CM

## 2021-09-01 NOTE — Telephone Encounter (Signed)
Pt called returning [facility] call for an appt.   [phone]

## 2021-09-03 ENCOUNTER — Telehealth: Payer: Self-pay | Admitting: Physical Medicine and Rehabilitation

## 2021-09-03 NOTE — Telephone Encounter (Signed)
Pt returned call. Very upset its taking so long to schedule.   CB [phone]

## 2021-09-11 ENCOUNTER — Ambulatory Visit (HOSPITAL_COMMUNITY)
Admission: RE | Admit: 2021-09-11 | Discharge: 2021-09-11 | Disposition: A | Discharge status: Home / Self Care | Payer: Medicare HMO | Source: Ambulatory Visit | Attending: Internal Medicine | Admitting: Internal Medicine

## 2021-09-11 ENCOUNTER — Encounter (HOSPITAL_COMMUNITY): Payer: Self-pay | Admitting: Internal Medicine

## 2021-09-11 ENCOUNTER — Other Ambulatory Visit: Payer: Self-pay

## 2021-09-11 VITALS — BP 108/70 | HR 78 | Wt 206.2 lb

## 2021-09-11 DIAGNOSIS — I482 Chronic atrial fibrillation, unspecified: Secondary | ICD-10-CM | POA: Diagnosis not present

## 2021-09-11 DIAGNOSIS — M549 Dorsalgia, unspecified: Secondary | ICD-10-CM | POA: Diagnosis not present

## 2021-09-11 DIAGNOSIS — Z8673 Personal history of transient ischemic attack (TIA), and cerebral infarction without residual deficits: Secondary | ICD-10-CM | POA: Diagnosis not present

## 2021-09-11 DIAGNOSIS — E119 Type 2 diabetes mellitus without complications: Secondary | ICD-10-CM | POA: Insufficient documentation

## 2021-09-11 DIAGNOSIS — I252 Old myocardial infarction: Secondary | ICD-10-CM | POA: Insufficient documentation

## 2021-09-11 DIAGNOSIS — Z7901 Long term (current) use of anticoagulants: Secondary | ICD-10-CM | POA: Diagnosis not present

## 2021-09-11 DIAGNOSIS — Z7984 Long term (current) use of oral hypoglycemic drugs: Secondary | ICD-10-CM | POA: Insufficient documentation

## 2021-09-11 DIAGNOSIS — Z8249 Family history of ischemic heart disease and other diseases of the circulatory system: Secondary | ICD-10-CM | POA: Insufficient documentation

## 2021-09-11 DIAGNOSIS — Z09 Encounter for follow-up examination after completed treatment for conditions other than malignant neoplasm: Secondary | ICD-10-CM | POA: Diagnosis not present

## 2021-09-11 DIAGNOSIS — Z79899 Other long term (current) drug therapy: Secondary | ICD-10-CM | POA: Insufficient documentation

## 2021-09-11 DIAGNOSIS — I2510 Atherosclerotic heart disease of native coronary artery without angina pectoris: Secondary | ICD-10-CM | POA: Diagnosis not present

## 2021-09-11 DIAGNOSIS — G4733 Obstructive sleep apnea (adult) (pediatric): Secondary | ICD-10-CM | POA: Insufficient documentation

## 2021-09-11 DIAGNOSIS — I110 Hypertensive heart disease with heart failure: Secondary | ICD-10-CM | POA: Insufficient documentation

## 2021-09-11 DIAGNOSIS — I5022 Chronic systolic (congestive) heart failure: Secondary | ICD-10-CM | POA: Diagnosis not present

## 2021-09-11 DIAGNOSIS — Z87891 Personal history of nicotine dependence: Secondary | ICD-10-CM | POA: Insufficient documentation

## 2021-09-11 DIAGNOSIS — R531 Weakness: Secondary | ICD-10-CM | POA: Diagnosis not present

## 2021-09-11 LAB — BASIC METABOLIC PANEL
Anion gap: 8 (ref 5–15)
BUN: 15 mg/dL (ref 8–23)
CO2: 26 mmol/L (ref 22–32)
Calcium: 9.3 mg/dL (ref 8.9–10.3)
Chloride: 104 mmol/L (ref 98–111)
Creatinine, Ser: 0.92 mg/dL (ref 0.61–1.24)
GFR, Estimated: >60 mL/min (ref >60)
Glucose, Bld: 337 mg/dL — ABNORMAL HIGH (ref 70–99)
Potassium: 3.8 mmol/L (ref 3.5–5.1)
Sodium: 138 mmol/L (ref 135–145)

## 2021-09-11 LAB — TSH: TSH: 1.919 u[IU]/mL (ref 0.350–4.500)

## 2021-09-11 LAB — CBC
HCT: 46.2 % (ref 39.0–52.0)
Hemoglobin: 15.4 g/dL (ref 13.0–17.0)
MCH: 33 pg (ref 26.0–34.0)
MCHC: 33.3 g/dL (ref 30.0–36.0)
MCV: 98.9 fL (ref 80.0–100.0)
Platelets: 160 10*3/uL (ref 150–400)
RBC: 4.67 MIL/uL (ref 4.22–5.81)
RDW: 13.5 % (ref 11.5–15.5)
WBC: 5.1 10*3/uL (ref 4.0–10.5)
nRBC: 0 % (ref 0.0–0.2)

## 2021-09-11 LAB — BRAIN NATRIURETIC PEPTIDE: B Natriuretic Peptide: 72.5 pg/mL (ref 0.0–100.0)

## 2021-09-11 MED ORDER — CARVEDILOL 6.25 MG PO TABS: 12.5000 mg | ORAL_TABLET | Freq: Two times a day (BID) | ORAL | 4 refills | Status: DC

## 2021-09-11 NOTE — Progress Notes (Signed)
[facility] Note    Date:  09/11/2021   ID:  Carl Wood, DOB Sep 14, 1941, MRN [id number]  Location: Home  Provider location: [address] [place] Type of Visit: Established patient PCP:  Marin Olp, Wood  Cardiologist:Dr Turner  Primary HF: Dr Haroldine Laws  EP: Dr Caryl Comes   History of Present Illness:  Carl Wood is a 80 y/o male with CAD s/p MI 1983, ICM with chronic systolic heart failure s/p Medtronic single chamber ICD (EF 20%) CVA 2014, DM2 VT, permanent A fib on Xarelto, SDH 2019, OSA, and former smoker.   Had CRT-D upgrade in April 2018.    In 12/18 saw Dr. Tomi Likens for chronic HA. Found to have small SDHs that were felt to be chronic after a previous fall when trimming vines. Xarelto stopped. F/u C 2/19 SDHs resolved. Xarelto restarted in 2/19  Today he returns for HF follow up with his wife. Says he is doing ok. Feels like he can't walk as far as he could previously. Feels he walks better if he walks with a cart. Says he feels that his legs are weak. Denis SOB, orthopnea or PND. Has pain in his back. Has seen a spine doctor and no evidence of spinal stenosis. Has also seen Neurology and leg strength ok on exam. Offered neck CT and EMG but he refused   ABIs 2017 No PAD  12/2019 Echo  EF 20-25%  Past Medical History:  Diagnosis Date   CAD (coronary artery disease)    CHF (congestive heart failure) (HCC)    Chronic atrial fibrillation (HCC)    Colon polyps    Diabetes mellitus    Diverticulosis of colon    DIVERTICULOSIS, COLON 10/16/2006   Qualifier: Diagnosis of  By: Carl Wood, Carl Wood     Excessive daytime sleepiness 02/19/2016   Hyperlipidemia    Hypertension    MI (mitral incompetence)    Nephrolithiasis    NEPHROLITHIASIS 06/26/2008   Qualifier: Diagnosis of  By: Carl Wood, CMA, Cindy     OSA (obstructive sleep apnea) 02/19/2016   Moderate to severe OSA with an AHI of 25/hr and on CPAP at 8cm H2O   PE (pulmonary embolism)     V-tach    Past Surgical History:  Procedure Laterality Date   BACK SURGERY     BASAL CELL CARCINOMA EXCISION     nose   BIV UPGRADE N/A 03/28/2017   Procedure: BiV Upgrade;  Surgeon: Carl Sprang, Wood;  Location: [facility];  Service: Cardiovascular;  Laterality: N/A;   CARDIAC CATHETERIZATION N/A 01/20/2016   Procedure: Right/Left Heart Cath and Coronary Angiography;  Surgeon: Carl Artist, Wood;  Location: [facility];  Service: Cardiovascular;  Laterality: N/A;   CARDIAC DEFIBRILLATOR PLACEMENT     medtronic virtuoso   CHOLECYSTECTOMY     COLONOSCOPY  12/11/2012   Procedure: COLONOSCOPY;  Surgeon: Carl Castle, Wood;  Location: [facility];  Service: Endoscopy;  Laterality: N/A;   KNEE ARTHROPLASTY       Current Outpatient Medications  Medication Sig Dispense Refill   ACCU-CHEK AVIVA PLUS test strip USE  STRIP TO CHECK GLUCOSE ONCE DAILY 100 each 4   Ascorbic Acid (VITAMIN C PO) Take 1 tablet by mouth.     carvedilol (COREG) 12.5 MG tablet TAKE 1 TABLET BY MOUTH TWICE DAILY WITH A MEAL 180 tablet 3   digoxin (LANOXIN) 0.25 MG tablet Take 1/2 (one-half) tablet by mouth  once daily 45 tablet 2   furosemide (LASIX) 20 MG tablet Take 1 tablet by mouth once a week 12 tablet 0   glimepiride (AMARYL) 4 MG tablet TAKE 2 TABLETS BY MOUTH ONCE DAILY WITH BREAKFAST 180 tablet 0   JARDIANCE 10 MG TABS tablet Take 1 tablet by mouth once daily 30 tablet 0   metFORMIN (GLUCOPHAGE-XR) 500 MG 24 hr tablet Take 2 tablets (1,000 mg total) by mouth in the morning and at bedtime. 360 tablet 3   rivaroxaban (XARELTO) 20 MG TABS tablet Take 1 tablet (20 mg total) by mouth daily. 30 tablet 0   rosuvastatin (CRESTOR) 40 MG tablet      sacubitril-valsartan (ENTRESTO) 49-51 MG Take 1 tablet by mouth 2 (two) times daily. 120 tablet 0   No current facility-administered medications for this encounter.    Allergies:   Penicillins and Sulfamethoxazole   Social History:  The patient   reports that he quit smoking about 8 years ago. His smoking use included cigarettes. He has a 50.00 pack-year smoking history. He has never used smokeless tobacco. He reports that he does not drink alcohol and does not use drugs.   Family History:  The patient's family history includes Bladder Cancer in his mother; Colon cancer in his father; Diabetes in his father; Heart attack (age of onset: 76) in his child; Heart attack (age of onset: 27) in his child; Heart disease in his father and mother.   ROS:  Please see the history of present illness.   All other systems are personally reviewed and negative.   Vitals:   09/11/21 1139  BP: 108/70  Pulse: 78  SpO2: 95%    Exam:  General:  Elderly male No resp difficulty HEENT: normal Neck: limited ROM . no JVD. Carotids 2+ bilat; no bruits. No lymphadenopathy or thryomegaly appreciated. Cor: PMI nondisplaced. Irregular rate & rhythm. No rubs, gallops or murmurs. Lungs: clear Abdomen: soft, nontender, nondistended. No hepatosplenomegaly. No bruits or masses. Good bowel sounds. Extremities: no cyanosis, clubbing, rash, edema Neuro: alert & orientedx3, cranial nerves grossly intact. moves all 4 extremities w/o difficulty. Affect pleasant  ICD interrogation in clinic: No VT/AF. Fluid ok. Activity level 1-2 hr/day Personally reviewed   Recent Labs: 01/01/2021: B Natriuretic Peptide 82.7 07/10/2021: ALT 17; BUN 18; Creat 0.94; Hemoglobin 15.6; Platelets 163; Potassium 4.5; Sodium 139   Wt Readings from Last 3 Encounters:  09/11/21 93.5 kg (206 lb 3.2 oz)  08/04/21 93.9 kg (207 lb)  07/10/21 94.9 kg (209 lb 3.2 oz)    ASSESSMENT AND PLAN:  1. Chronic Systolic HF: ICM, s/p Medtronic Bi-V ICD (upgrade 4/18). Echo 09/2016 EF 25-30%. ECHO 8/19 EF 20% - Echo 12/12/19 EF 20-25% RV ok  - Worse today NYHA III-IIIB - Volume status ok  - Main complaint is leg fatigue/weakness but given normal leg strength on exam and normal ABIs, I worry this may be a HF  limitation particularly in light of previous CPX in 2018 - CPX testing on 5/18 pVO2 down 14.5->13.0 (slope 34) - Decrease carvedilol to 6.25 bid - Continue digoxin.  - Continue Entresto 49/51 mg BID. - Continue Jardiance. - No spiro with history of hyperkalemia and soft BP.  - ICD interrogation in clinic: 98% biv pacing. No VT/AF. Fluid ok. Activity level 1-2 hr/day Personally reviewed - Overall worse today. May be multifactorial but suspect HF predominates. Likely too weak for CPX testing or advanced therapies. Have encouraged him to walk for 10-15 mins bid and we  will reassess at next visit.    2. OSA - Continue CPAP. Follows with Dr. Radford Pax - Follows with Dr. Radford Pax. Downloads have looked good    3. Chronic A fib - Rate controlled - Xarelto restarted after resolution of SDH. No evidence of current bleeding   4. CAD: - Has known distal LAD lesion 95% and mid LAD 40%. No benefit to revascularization given wall motion abnormality.  - No s/s angina currently - Continue statin. Off ASA with Xarelto   Total time spent 35 minutes. Over half that time spent discussing above.    Signed, Glori Bickers, Wood  12:02 PM   [facility] [address] [facility] and [place] [address] [phone] (office) [phone] (fax)

## 2021-09-11 NOTE — Patient Instructions (Signed)
Decrease Coreg to 6.25 mg Twice daily  Labs done today, your results will be available in MyChart, we will contact you for abnormal readings.   Your physician has requested that you have an echocardiogram. Echocardiography is a painless test that uses sound waves to create images of your heart. It provides your doctor with information about the size and shape of your heart and how well your heart's chambers and valves are working. This procedure takes approximately one hour. There are no restrictions for this procedure.  Your physician recommends that you schedule a follow-up appointment in: 4 months with echocardiogram  If you have any questions or concerns before your next appointment please send [place] a message through [facility] or call our office at [phone].    TO LEAVE A MESSAGE FOR THE NURSE SELECT OPTION 2, PLEASE LEAVE A MESSAGE INCLUDING: YOUR NAME DATE OF BIRTH CALL BACK NUMBER REASON FOR CALL**this is important as we prioritize the call backs  YOU WILL RECEIVE A CALL BACK THE SAME DAY AS LONG AS YOU CALL BEFORE 4:00 PM  At the [facility], you and your health needs are our priority. As part of our continuing mission to provide you with exceptional heart care, we have created designated Provider Care Teams. These Care Teams include your primary Cardiologist (physician) and Advanced Practice Providers (APPs- Physician Assistants and Nurse Practitioners) who all work together to provide you with the care you need, when you need it.   You may see any of the following providers on your designated Care Team at your next follow up: Dr Glori Bickers Dr Loralie Champagne Dr Patrice Paradise, NP Lyda Jester, [place] Ginnie Smart Audry Riles, PharmD   Please be sure to bring in all your medications bottles to every appointment.

## 2021-09-12 NOTE — Addendum Note (Signed)
Encounter addended by: Jolaine Artist, MD on: 09/12/2021 11:42 PM  Actions taken: Charge Capture section accepted

## 2021-09-17 ENCOUNTER — Other Ambulatory Visit (HOSPITAL_COMMUNITY): Payer: Self-pay | Admitting: Cardiology

## 2021-09-17 MED ORDER — CARVEDILOL 6.25 MG PO TABS: 6.2500 mg | ORAL_TABLET | Freq: Two times a day (BID) | ORAL | 4 refills | Status: DC

## 2021-09-21 ENCOUNTER — Other Ambulatory Visit (HOSPITAL_COMMUNITY): Payer: Self-pay | Admitting: *Deleted

## 2021-09-21 MED ORDER — ENTRESTO 49-51 MG PO TABS: 1.0000 | ORAL_TABLET | Freq: Two times a day (BID) | ORAL | 11 refills | Status: DC

## 2021-09-22 ENCOUNTER — Other Ambulatory Visit (HOSPITAL_COMMUNITY): Payer: Self-pay

## 2021-09-22 MED ORDER — ENTRESTO 49-51 MG PO TABS: 1.0000 | ORAL_TABLET | Freq: Two times a day (BID) | ORAL | 3 refills | Status: DC

## 2021-09-23 ENCOUNTER — Telehealth: Payer: Self-pay | Admitting: Physical Medicine and Rehabilitation

## 2021-09-23 NOTE — Telephone Encounter (Signed)
Left message #2 to reschedule patient's 10/24 RFA appointment to 11/8 at 1530.

## 2021-09-23 NOTE — Telephone Encounter (Signed)
Patient called. Returning a call to rsc with Dr. Ernestina Patches.

## 2021-09-28 ENCOUNTER — Encounter: Payer: Medicare HMO | Admitting: Physical Medicine and Rehabilitation

## 2021-09-30 ENCOUNTER — Other Ambulatory Visit (HOSPITAL_COMMUNITY): Payer: Self-pay | Admitting: *Deleted

## 2021-09-30 MED ORDER — ENTRESTO 49-51 MG PO TABS: 1.0000 | ORAL_TABLET | Freq: Two times a day (BID) | ORAL | 3 refills | Status: DC

## 2021-10-05 ENCOUNTER — Other Ambulatory Visit: Payer: Self-pay

## 2021-10-05 ENCOUNTER — Ambulatory Visit: Payer: Self-pay

## 2021-10-05 ENCOUNTER — Encounter: Payer: Self-pay | Admitting: Physical Medicine and Rehabilitation

## 2021-10-05 ENCOUNTER — Ambulatory Visit: Payer: Medicare HMO | Admitting: Physical Medicine and Rehabilitation

## 2021-10-05 VITALS — BP 109/64 | HR 88

## 2021-10-05 DIAGNOSIS — M47816 Spondylosis without myelopathy or radiculopathy, lumbar region: Secondary | ICD-10-CM

## 2021-10-05 MED ORDER — BETAMETHASONE SOD PHOS & ACET 6 (3-3) MG/ML IJ SUSP
12.0000 mg | Freq: Once | INTRAMUSCULAR | Status: AC
  Administered 2021-10-05: 12 mg

## 2021-10-05 NOTE — Progress Notes (Signed)
Pt state lower back pain. Pt state walking and standing makes the pain worse. Pt state he takes nothing for the pain.  Numeric Pain Rating Scale and Functional Assessment Average Pain 5   In the last MONTH (on 0-10 scale) has pain interfered with the following?  1. General activity like being  able to carry out your everyday physical activities such as walking, climbing stairs, carrying groceries, or moving a chair?  Rating(10)   +Driver, +BT, -Dye Allergies.

## 2021-10-05 NOTE — Patient Instructions (Signed)

## 2021-10-08 NOTE — Procedures (Signed)
Lumbar Facet Joint Nerve Denervation  Patient: Carl Wood      Date of Birth: 1941-05-29 MRN: [id number] PCP: Marin Olp, MD      Visit Date: 10/05/2021   Universal Protocol:    Date/Time: 11/03/225:40 AM  Consent Given By: the patient  Position: PRONE  Additional Comments: Vital signs were monitored before and after the procedure. Patient was prepped and draped in the usual sterile fashion. The correct patient, procedure, and site was verified.   Injection Procedure Details:   Procedure diagnoses:  1. Spondylosis without myelopathy or radiculopathy, lumbar region      Meds Administered:  Meds ordered this encounter  Medications   betamethasone acetate-betamethasone sodium phosphate (CELESTONE) injection 12 mg     Laterality: Right  Location/Site:  L3-L4, L2 and L3 medial branches, L4-L5, L3 and L4 medial branches, and L5-S1, L4 medial branch and L5 dorsal ramus  Needle: 18 ga.,  38mm active tip, 131mm RF Cannula  Needle Placement: Along juncture of superior articular process and transverse pocess  Findings:  -Comments:  Procedure Details: For each desired target nerve, the corresponding transverse process (sacral ala for the L5 dorsal rami) was identified and the fluoroscope was positioned to square off the endplates of the corresponding vertebral body to achieve a true AP midline view.  The beam was then obliqued 15 to 20 degrees and caudally tilted 15 to 20 degrees to line up a trajectory along the target nerves. The skin over the target of the junction of superior articulating process and transverse process (sacral ala for the L5 dorsal rami) was infiltrated with 22ml of 1% Lidocaine without Epinephrine.  The 18 gauge 36mm active tip outer cannula was advanced in trajectory view to the target.  This procedure was repeated for each target nerve.  Then, for all levels, the outer cannula placement was fine-tuned and the position was then confirmed with  bi-planar imaging.    Test stimulation was done both at sensory and motor levels to ensure there was no radicular stimulation. The target tissues were then infiltrated with 1 ml of 1% Lidocaine without Epinephrine. Subsequently, a percutaneous neurotomy was carried out for 90 seconds at 80 degrees Celsius.  After the completion of the lesion, 1 ml of injectate was delivered. It was then repeated for each facet joint nerve mentioned above. Appropriate radiographs were obtained to verify the probe placement during the neurotomy.   Additional Comments:  No complications occurred Dressing: 2 x 2 sterile gauze and Band-Aid    Post-procedure details: Patient was observed during the procedure. Post-procedure instructions were reviewed.  Patient left the clinic in stable condition.

## 2021-10-08 NOTE — Progress Notes (Signed)
Carl Wood - 80 y.o. male MRN [id number]  Date of birth: 12/09/1940  Office Visit Note: Visit Date: 10/05/2021 PCP: Marin Olp, MD Referred by: Marin Olp, MD  Subjective: Chief Complaint  Patient presents with   Lower Back - Pain   HPI:  Carl Wood is a 80 y.o. male who comes in todayfor planned radiofrequency ablation of the Right L3-4, L4-5, and L5-S1 Lumbar facet joints. This would be ablation of the corresponding medial branches and/or dorsal rami.  Patient has had double diagnostic blocks with more than 50% relief.  These are documented on pain diary.  They have had chronic back pain for quite some time, more than 3 months, which has been an ongoing situation with recalcitrant axial back pain.  They have no radicular pain.  Their axial pain is worse with standing and ambulating and on exam today with facet loading.  They have had physical therapy as well as home exercise program.  The imaging noted in the chart below indicated facet pathology. Accordingly they meet all the criteria and qualification for for radiofrequency ablation and we are going to complete this today hopefully for more longer term relief as part of comprehensive management program.   Patient has real significant difficulty laying on the procedure table frank the time Duda neck pain and being uncomfortable.  He did bring a pillow today to try.  He also has a lot of sensitivity with any injections in the scanner musculature and almost has not underlying central sensitivity pain syndrome.  Patient has done well with prior injections.  ROS Otherwise per HPI.  Assessment & Plan: Visit Diagnoses:    ICD-10-CM   1. Spondylosis without myelopathy or radiculopathy, lumbar region  M47.816 XR C-ARM NO REPORT    Radiofrequency,Lumbar    betamethasone acetate-betamethasone sodium phosphate (CELESTONE) injection 12 mg      Plan: No additional findings.   Meds & Orders:  Meds ordered this  encounter  Medications   betamethasone acetate-betamethasone sodium phosphate (CELESTONE) injection 12 mg    Orders Placed This Encounter  Procedures   Radiofrequency,Lumbar   XR C-ARM NO REPORT    Follow-up: Return if symptoms worsen or fail to improve.   Procedures: No procedures performed  Lumbar Facet Joint Nerve Denervation  Patient: Carl Wood      Date of Birth: 06-08-1941 MRN: [id number] PCP: Marin Olp, MD      Visit Date: 10/05/2021   Universal Protocol:    Date/Time: 11/03/225:40 AM  Consent Given By: the patient  Position: PRONE  Additional Comments: Vital signs were monitored before and after the procedure. Patient was prepped and draped in the usual sterile fashion. The correct patient, procedure, and site was verified.   Injection Procedure Details:   Procedure diagnoses:  1. Spondylosis without myelopathy or radiculopathy, lumbar region      Meds Administered:  Meds ordered this encounter  Medications   betamethasone acetate-betamethasone sodium phosphate (CELESTONE) injection 12 mg     Laterality: Right  Location/Site:  L3-L4, L2 and L3 medial branches, L4-L5, L3 and L4 medial branches, and L5-S1, L4 medial branch and L5 dorsal ramus  Needle: 18 ga.,  81mm active tip, 178mm RF Cannula  Needle Placement: Along juncture of superior articular process and transverse pocess  Findings:  -Comments:  Procedure Details: For each desired target nerve, the corresponding transverse process (sacral ala for the L5 dorsal rami) was identified and the fluoroscope was positioned to square off the  endplates of the corresponding vertebral body to achieve a true AP midline view.  The beam was then obliqued 15 to 20 degrees and caudally tilted 15 to 20 degrees to line up a trajectory along the target nerves. The skin over the target of the junction of superior articulating process and transverse process (sacral ala for the L5 dorsal rami) was  infiltrated with 30ml of 1% Lidocaine without Epinephrine.  The 18 gauge 54mm active tip outer cannula was advanced in trajectory view to the target.  This procedure was repeated for each target nerve.  Then, for all levels, the outer cannula placement was fine-tuned and the position was then confirmed with bi-planar imaging.    Test stimulation was done both at sensory and motor levels to ensure there was no radicular stimulation. The target tissues were then infiltrated with 1 ml of 1% Lidocaine without Epinephrine. Subsequently, a percutaneous neurotomy was carried out for 90 seconds at 80 degrees Celsius.  After the completion of the lesion, 1 ml of injectate was delivered. It was then repeated for each facet joint nerve mentioned above. Appropriate radiographs were obtained to verify the probe placement during the neurotomy.   Additional Comments:  No complications occurred Dressing: 2 x 2 sterile gauze and Band-Aid    Post-procedure details: Patient was observed during the procedure. Post-procedure instructions were reviewed.  Patient left the clinic in stable condition.       Clinical History: LUMBAR SPINE - COMPLETE 4+ VIEW   COMPARISON:  08/16/2019   FINDINGS: Frontal, bilateral oblique, lateral views of the lumbar spine are obtained. There are 5 non-rib-bearing lumbar type vertebral bodies in normal anatomic alignment. No acute displaced fractures. Diffuse lumbar spondylosis and facet hypertrophy is noted, greatest from L3 through S1. Sacroiliac joints are normal.   IMPRESSION: 1. Multilevel spondylosis and facet hypertrophy without significant change since prior exam.     Electronically Signed   By: Randa Ngo M.D.   On: 05/21/2021 09:19     Objective:  VS:  HT:    WT:   BMI:     BP:109/64  HR:88bpm  TEMP: ( )  RESP:  Physical Exam Vitals and nursing note reviewed.  Constitutional:      General: He is not in acute distress.    Appearance: Normal  appearance. He is not ill-appearing.  HENT:     Head: Normocephalic and atraumatic.     Right Ear: External ear normal.     Left Ear: External ear normal.     Nose: No congestion.  Eyes:     Extraocular Movements: Extraocular movements intact.  Cardiovascular:     Rate and Rhythm: Normal rate.     Pulses: Normal pulses.  Pulmonary:     Effort: Pulmonary effort is normal. No respiratory distress.  Abdominal:     General: There is no distension.     Palpations: Abdomen is soft.  Musculoskeletal:        General: No tenderness or signs of injury.     Cervical back: Neck supple.     Right lower leg: No edema.     Left lower leg: No edema.     Comments: Patient has good distal strength without clonus. Patient somewhat slow to rise from a seated position to full extension.  There is concordant low back pain with facet loading and lumbar spine extension rotation.  There are no definitive trigger points but the patient is somewhat tender across the lower back and PSIS.  There  is no pain with hip rotation.   Skin:    Findings: No erythema or rash.  Neurological:     General: No focal deficit present.     Mental Status: He is alert and oriented to person, place, and time.     Sensory: No sensory deficit.     Motor: No weakness or abnormal muscle tone.     Coordination: Coordination normal.  Psychiatric:        Mood and Affect: Mood normal.        Behavior: Behavior normal.     Imaging: No results found.

## 2021-10-14 ENCOUNTER — Ambulatory Visit: Payer: Self-pay

## 2021-10-14 ENCOUNTER — Other Ambulatory Visit: Payer: Self-pay

## 2021-10-14 ENCOUNTER — Encounter: Payer: Self-pay | Admitting: Physical Medicine and Rehabilitation

## 2021-10-14 ENCOUNTER — Ambulatory Visit: Payer: Medicare HMO | Admitting: Physical Medicine and Rehabilitation

## 2021-10-14 VITALS — BP 103/68 | HR 89

## 2021-10-14 DIAGNOSIS — M47816 Spondylosis without myelopathy or radiculopathy, lumbar region: Secondary | ICD-10-CM | POA: Diagnosis not present

## 2021-10-14 NOTE — Patient Instructions (Signed)

## 2021-10-14 NOTE — Progress Notes (Signed)
Pt state lower back pain. Pt state walking and standing makes the pain worse. Pt state he takes nothing for the pain  Numeric Pain Rating Scale and Functional Assessment Average Pain 5   In the last MONTH (on 0-10 scale) has pain interfered with the following?  1. General activity like being  able to carry out your everyday physical activities such as walking, climbing stairs, carrying groceries, or moving a chair?  Rating(8)   +Driver, -BT, -Dye Allergies.

## 2021-10-18 NOTE — Progress Notes (Signed)
Carl Wood - 80 y.o. adult MRN [id number]  Date of birth: Sep 02, 1941  Office Visit Note: Visit Date: 10/14/2021 PCP: Marin Olp, MD Referred by: Marin Olp, MD  Subjective: Chief Complaint  Patient presents with   Lower Back - Pain   HPI:  Carl Wood is a 80 y.o. adult who comes in todayfor planned radiofrequency ablation of the Left L3-4, L4-5, and L5-S1 Lumbar facet joints. This would be ablation of the corresponding medial branches and/or dorsal rami.  Patient has had double diagnostic blocks with more than 50% relief.  These are documented on pain diary.  They have had chronic back pain for quite some time, more than 3 months, which has been an ongoing situation with recalcitrant axial back pain.  They have no radicular pain.  Their axial pain is worse with standing and ambulating and on exam today with facet loading.  They have had physical therapy as well as home exercise program.  The imaging noted in the chart below indicated facet pathology. Accordingly they meet all the criteria and qualification for for radiofrequency ablation and we are going to complete this today hopefully for more longer term relief as part of comprehensive management program.   ROS Otherwise per HPI.  Assessment & Plan: Visit Diagnoses:    ICD-10-CM   1. Spondylosis without myelopathy or radiculopathy, lumbar region  M47.816 XR C-ARM NO REPORT    Radiofrequency,Lumbar      Plan: No additional findings.   Meds & Orders: No orders of the defined types were placed in this encounter.   Orders Placed This Encounter  Procedures   Radiofrequency,Lumbar   XR C-ARM NO REPORT    Follow-up: Return in about 4 weeks (around 11/11/2021).   Procedures: No procedures performed  Lumbar Facet Joint Nerve Denervation  Patient: Carl Wood      Date of Birth: June 29, 1941 MRN: [id number] PCP: Marin Olp, MD      Visit Date: 10/14/2021   Universal Protocol:    Date/Time:  11/13/225:32 PM  Consent Given By: the patient  Position: PRONE  Additional Comments: Vital signs were monitored before and after the procedure. Patient was prepped and draped in the usual sterile fashion. The correct patient, procedure, and site was verified.   Injection Procedure Details:   Procedure diagnoses:  1. Spondylosis without myelopathy or radiculopathy, lumbar region      Meds Administered: No orders of the defined types were placed in this encounter.    Laterality: Left  Location/Site:  L3-L4, L2 and L3 medial branches, L4-L5, L3 and L4 medial branches, and L5-S1, L4 medial branch and L5 dorsal ramus  Needle: 18 ga.,  32mm active tip, 172mm RF Cannula  Needle Placement: Along juncture of superior articular process and transverse pocess  Findings:  -Comments:  Procedure Details: For each desired target nerve, the corresponding transverse process (sacral ala for the L5 dorsal rami) was identified and the fluoroscope was positioned to square off the endplates of the corresponding vertebral body to achieve a true AP midline view.  The beam was then obliqued 15 to 20 degrees and caudally tilted 15 to 20 degrees to line up a trajectory along the target nerves. The skin over the target of the junction of superior articulating process and transverse process (sacral ala for the L5 dorsal rami) was infiltrated with 66ml of 1% Lidocaine without Epinephrine.  The 18 gauge 77mm active tip outer cannula was advanced in trajectory view to the target.  This procedure was repeated for each target nerve.  Then, for all levels, the outer cannula placement was fine-tuned and the position was then confirmed with bi-planar imaging.    Test stimulation was done both at sensory and motor levels to ensure there was no radicular stimulation. The target tissues were then infiltrated with 1 ml of 1% Lidocaine without Epinephrine. Subsequently, a percutaneous neurotomy was carried out for 90  seconds at 80 degrees Celsius.  After the completion of the lesion, 1 ml of injectate was delivered. It was then repeated for each facet joint nerve mentioned above. Appropriate radiographs were obtained to verify the probe placement during the neurotomy.   Additional Comments:  The patient tolerated the procedure well Dressing: 2 x 2 sterile gauze and Band-Aid    Post-procedure details: Patient was observed during the procedure. Post-procedure instructions were reviewed.  Patient left the clinic in stable condition.       Clinical History: LUMBAR SPINE - COMPLETE 4+ VIEW   COMPARISON:  08/16/2019   FINDINGS: Frontal, bilateral oblique, lateral views of the lumbar spine are obtained. There are 5 non-rib-bearing lumbar type vertebral bodies in normal anatomic alignment. No acute displaced fractures. Diffuse lumbar spondylosis and facet hypertrophy is noted, greatest from L3 through S1. Sacroiliac joints are normal.   IMPRESSION: 1. Multilevel spondylosis and facet hypertrophy without significant change since prior exam.     Electronically Signed   By: Randa Ngo M.D.   On: 05/21/2021 09:19     Objective:  VS:  HT:    WT:   BMI:     BP:103/68  HR:89bpm  TEMP: ( )  RESP:  Physical Exam Vitals and nursing note reviewed.  Constitutional:      General: He is not in acute distress.    Appearance: Normal appearance. He is not ill-appearing.  HENT:     Head: Normocephalic and atraumatic.     Right Ear: External ear normal.     Left Ear: External ear normal.  Eyes:     Extraocular Movements: Extraocular movements intact.  Cardiovascular:     Rate and Rhythm: Normal rate.     Pulses: Normal pulses.  Pulmonary:     Effort: Pulmonary effort is normal. No respiratory distress.  Abdominal:     General: There is no distension.     Palpations: Abdomen is soft.  Musculoskeletal:        General: Tenderness present.     Cervical back: Neck supple.     Right lower  leg: No edema.     Left lower leg: No edema.     Comments: Patient has good distal strength with no pain over the greater trochanters.  No clonus or focal weakness. Patient somewhat slow to rise from a seated position to full extension.  There is concordant low back pain with facet loading and lumbar spine extension rotation.  There are no definitive trigger points but the patient is somewhat tender across the lower back and PSIS.  There is no pain with hip rotation.   Skin:    Findings: No erythema, lesion or rash.  Neurological:     General: No focal deficit present.     Mental Status: He is alert and oriented to person, place, and time.     Sensory: No sensory deficit.     Motor: No weakness or abnormal muscle tone.     Coordination: Coordination normal.  Psychiatric:        Mood and Affect: Mood normal.  Behavior: Behavior normal.     Imaging: No results found.

## 2021-10-18 NOTE — Procedures (Signed)
Lumbar Facet Joint Nerve Denervation  Patient: Carl Wood      Date of Birth: [birth year] MRN: [id number] PCP: Marin Olp, MD      Visit Date: 10/14/2021   Universal Protocol:    Date/Time: 11/13/225:32 PM  Consent Given By: the patient  Position: PRONE  Additional Comments: Vital signs were monitored before and after the procedure. Patient was prepped and draped in the usual sterile fashion. The correct patient, procedure, and site was verified.   Injection Procedure Details:   Procedure diagnoses:  1. Spondylosis without myelopathy or radiculopathy, lumbar region      Meds Administered: No orders of the defined types were placed in this encounter.    Laterality: Left  Location/Site:  L3-L4, L2 and L3 medial branches, L4-L5, L3 and L4 medial branches, and L5-S1, L4 medial branch and L5 dorsal ramus  Needle: 18 ga.,  22mm active tip, 169mm RF Cannula  Needle Placement: Along juncture of superior articular process and transverse pocess  Findings:  -Comments:  Procedure Details: For each desired target nerve, the corresponding transverse process (sacral ala for the L5 dorsal rami) was identified and the fluoroscope was positioned to square off the endplates of the corresponding vertebral body to achieve a true AP midline view.  The beam was then obliqued 15 to 20 degrees and caudally tilted 15 to 20 degrees to line up a trajectory along the target nerves. The skin over the target of the junction of superior articulating process and transverse process (sacral ala for the L5 dorsal rami) was infiltrated with 12ml of 1% Lidocaine without Epinephrine.  The 18 gauge 90mm active tip outer cannula was advanced in trajectory view to the target.  This procedure was repeated for each target nerve.  Then, for all levels, the outer cannula placement was fine-tuned and the position was then confirmed with bi-planar imaging.    Test stimulation was done both at sensory and  motor levels to ensure there was no radicular stimulation. The target tissues were then infiltrated with 1 ml of 1% Lidocaine without Epinephrine. Subsequently, a percutaneous neurotomy was carried out for 90 seconds at 80 degrees Celsius.  After the completion of the lesion, 1 ml of injectate was delivered. It was then repeated for each facet joint nerve mentioned above. Appropriate radiographs were obtained to verify the probe placement during the neurotomy.   Additional Comments:  The patient tolerated the procedure well Dressing: 2 x 2 sterile gauze and Band-Aid    Post-procedure details: Patient was observed during the procedure. Post-procedure instructions were reviewed.  Patient left the clinic in stable condition.

## 2021-10-19 ENCOUNTER — Other Ambulatory Visit: Payer: Self-pay | Admitting: Family Medicine

## 2021-10-20 ENCOUNTER — Other Ambulatory Visit (HOSPITAL_COMMUNITY): Payer: Self-pay | Admitting: *Deleted

## 2021-10-20 MED ORDER — ENTRESTO 49-51 MG PO TABS: 1.0000 | ORAL_TABLET | Freq: Two times a day (BID) | ORAL | 3 refills | Status: DC

## 2021-10-27 ENCOUNTER — Ambulatory Visit (INDEPENDENT_AMBULATORY_CARE_PROVIDER_SITE_OTHER): Payer: Medicare HMO

## 2021-10-27 ENCOUNTER — Telehealth (HOSPITAL_COMMUNITY): Payer: Self-pay | Admitting: Pharmacy Technician

## 2021-10-27 DIAGNOSIS — I472 Ventricular tachycardia, unspecified: Secondary | ICD-10-CM | POA: Diagnosis not present

## 2021-10-27 LAB — CUP PACEART REMOTE DEVICE CHECK
Battery Remaining Longevity: 18 mo
Battery Voltage: 2.91 V
Brady Statistic AP VP Percent: 0 %
Brady Statistic AP VS Percent: 0 %
Brady Statistic AS VP Percent: 0 %
Brady Statistic AS VS Percent: 0 %
Brady Statistic RA Percent Paced: 0 %
Brady Statistic RV Percent Paced: 96.5 %
Date Time Interrogation Session: 20221122012203
HighPow Impedance: 85 Ohm
Implantable Lead Implant Date: 20010723
Implantable Lead Implant Date: 20180423
Implantable Lead Location: 753858
Implantable Lead Location: 753860
Implantable Lead Model: 6943
Implantable Pulse Generator Implant Date: 20180423
Lead Channel Impedance Value: 1007 Ohm
Lead Channel Impedance Value: 1026 Ohm
Lead Channel Impedance Value: 266 Ohm
Lead Channel Impedance Value: 266 Ohm
Lead Channel Impedance Value: 266 Ohm
Lead Channel Impedance Value: 279.525
Lead Channel Impedance Value: 279.525
Lead Channel Impedance Value: 304 Ohm
Lead Channel Impedance Value: 4047 Ohm
Lead Channel Impedance Value: 418 Ohm
Lead Channel Impedance Value: 532 Ohm
Lead Channel Impedance Value: 532 Ohm
Lead Channel Impedance Value: 532 Ohm
Lead Channel Impedance Value: 589 Ohm
Lead Channel Impedance Value: 950 Ohm
Lead Channel Impedance Value: 950 Ohm
Lead Channel Impedance Value: 950 Ohm
Lead Channel Impedance Value: 988 Ohm
Lead Channel Pacing Threshold Amplitude: 1.375 V
Lead Channel Pacing Threshold Amplitude: 1.625 V
Lead Channel Pacing Threshold Pulse Width: 0.4 ms
Lead Channel Pacing Threshold Pulse Width: 1 ms
Lead Channel Sensing Intrinsic Amplitude: 11.5 mV
Lead Channel Sensing Intrinsic Amplitude: 11.5 mV
Lead Channel Setting Pacing Amplitude: 2.25 V
Lead Channel Setting Pacing Amplitude: 3 V
Lead Channel Setting Pacing Pulse Width: 0.4 ms
Lead Channel Setting Pacing Pulse Width: 1 ms
Lead Channel Setting Sensing Sensitivity: 0.3 mV

## 2021-10-27 NOTE — Telephone Encounter (Signed)
Advanced Heart Failure Patient Advocate Encounter  Sent in [employer] application via fax.  Will follow up.  

## 2021-11-05 NOTE — Progress Notes (Signed)
Remote ICD transmission.   

## 2021-11-06 NOTE — Progress Notes (Signed)
Phone [phone] In person visit   Subjective:   Carl Wood is a 80 y.o. year old very pleasant adult patient who presents for/with See problem oriented charting Chief Complaint  Patient presents with   Follow-up    Pt came in for follow up with no complaints   Hypertension   Diabetes    This visit occurred during the SARS-CoV-2 public health emergency.  Safety protocols were in place, including screening questions prior to the visit, additional usage of staff PPE, and extensive cleaning of exam room while observing appropriate contact time as indicated for disinfecting solutions.   Past Medical History-  Patient Active Problem List   Diagnosis Date Noted   COPD (chronic obstructive pulmonary disease) ([place]) 12/26/2018    Priority: High   History of subdural hematoma 11/21/2017    Priority: High   Chronic atrial fibrillation ([place])     Priority: High   Chronic systolic heart failure ([place]) 08/06/2015    Priority: High   Former smoker 11/15/2014    Priority: High   History of cardioembolic cerebrovascular accident (CVA) 12/19/2012    Priority: High    ventricular tachycardia-non sustained  02/17/2012    Priority: High   Diabetes mellitus type II, controlled ([place]) 10/16/2006    Priority: High   CAD (coronary artery disease) 10/16/2006    Priority: High   Chronic pulmonary embolism ([place]) 10/16/2006    Priority: High   Dyspnea on exertion 08/22/2017    Priority: Medium    OSA (obstructive sleep apnea) 02/19/2016    Priority: Medium    Carotid artery stenosis 03/17/2015    Priority: Medium    Implantable cardioverter-defibrillator (ICD) in situ 02/17/2012    Priority: Medium    History of skin cancer 07/05/2007    Priority: Medium    Hyperlipidemia associated with type 2 diabetes mellitus ([place]) 10/16/2006    Priority: Medium    Hypertension associated with diabetes ([place]) 10/16/2006    Priority: Medium    Osteoarthritis of right knee 09/22/2018    Priority:  Low   Frontal headache 10/06/2017    Priority: Low   Rhinitis 08/22/2017    Priority: Low   LBBB (left bundle branch block) 08/06/2015    Priority: Low   Actinic keratosis 11/15/2014    Priority: Low   Benign neoplasm of colon 12/11/2012    Priority: Low   Ischemic cardiomyopathy 03/28/2017    Medications- reviewed and updated Current Outpatient Medications  Medication Sig Dispense Refill   ACCU-CHEK AVIVA PLUS test strip USE  STRIP TO CHECK GLUCOSE ONCE DAILY 100 each 4   Ascorbic Acid (VITAMIN C PO) Take 1 tablet by mouth.     carvedilol (COREG) 6.25 MG tablet Take 1 tablet (6.25 mg total) by mouth 2 (two) times daily with a meal. 60 tablet 4   digoxin (LANOXIN) 0.25 MG tablet Take 1/2 (one-half) tablet by mouth once daily 45 tablet 2   furosemide (LASIX) 20 MG tablet Take 1 tablet by mouth once a week 12 tablet 0   glimepiride (AMARYL) 4 MG tablet TAKE 2 TABLETS BY MOUTH ONCE DAILY WITH BREAKFAST 180 tablet 0   JARDIANCE 10 MG TABS tablet Take 1 tablet by mouth once daily 30 tablet 0   metFORMIN (GLUCOPHAGE-XR) 500 MG 24 hr tablet Take 2 tablets (1,000 mg total) by mouth in the morning and at bedtime. 360 tablet 3   rivaroxaban (XARELTO) 20 MG TABS tablet Take 1 tablet (20 mg total) by mouth daily. [place]  tablet 0   rosuvastatin (CRESTOR) 40 MG tablet      sacubitril-valsartan (ENTRESTO) 49-51 MG Take 1 tablet by mouth 2 (two) times daily. 180 tablet 3   No current facility-administered medications for this visit.     Objective:  BP 136/72   Pulse 82   Temp 97.6 F (36.4 C) (Temporal)   Ht 5\' 11"  (1.803 m)   Wt 204 lb (92.5 kg)   SpO2 94%   BMI 28.45 kg/m  Gen: NAD, resting comfortably CV: RRR no murmurs rubs or gallops Lungs: CTAB no crackles, wheeze, rhonchi Abdomen: soft/nontender/nondistended/normal bowel sounds.  Ext: minimal edema Skin: warm, dry     Assessment and Plan   #Ongoing neck pain-has upcoming CT cervical spine-unable to get MRI due to  ICD -Previously worked with physical therapy- did have prior improvement with treatments up to 50% . Still hard to bend neck.   #Chronic systolic heart failure-follows with Dr. Haroldine Laws #ICD in place with history of ventricular tachycardia S: Compliant with carvedilol 12.5 mg twice a day, digoxin, Lasix 20 mg-once a week recommended last visit with weight trending up- now may space back out to 10 days- if shorter increases , Entresto. Also on Jardiance for diabetes which is favorable for heart failure patients -ICD monitored by cardiology -Weight down 5 pounds from last visit  A/P: CHF appears stable-continue current medications- euvolemic.  ICD status noted  # Atrial fibrillation chronic/ history of cardioemolic CVA/history subdural hematoma S: Compliant with carvedilol 12.5 mg twice a day for rate control and Xarelto 20 mg for anticoagulation. -Does have history of subdural hematoma-Xarelto was held short-term but was restarted once resolved -History of cardioembolic strokes we preferred to have him on Xarelto as long as no falls A/P: Appropriately anticoagulated and rate controlled without evidence of recurrence of subdural hematoma-continue current medication  #CAD/hyperlipidemia/carotid artery stenosis S: History of MI in 1983. Denies history of stents or bypass-streptokinase was used in the past per patient. - no chest pain. Stable shortness of breath -Due to bleeding risk is on Xarelto alone and not on aspirin -Patient compliant with rosuvastatin 40 mg daily.  Lab Results  Component Value Date   CHOL 112 03/10/2021   HDL 38.20 (L) 03/10/2021   LDLCALC 50 03/10/2021   LDLDIRECT 77 11/06/2020   TRIG 119.0 03/10/2021   CHOLHDL 3 03/10/2021   A/P: Well-controlled last visit-update full lipid panel next labs most likely -consider zetia if LDL goes back over 70 again  #COPD S: Compliant with Symbicort last visit-ran out and symptoms did not worsen.  He prefers to remain off of  medicine A/P: Stable without medication-continue to monitor. Stable shortness of breath   # Diabetes S: Compliant with metformin 1000mg  BID XR, jardiance 10mg  (q2 hour urination so wants to avoid increase ), amaryl 8 mg -At his age with comorbid conditions we have been hesitant to change medications-already has some urinary symptoms on Jardiance 10 mg to hesitant to increase to 25 mg and he wants to avoid insulin  -Weight loss could be potentially diuretic related- diet pretty stable  Lab Results  Component Value Date   HGBA1C 8.3 (H) 07/10/2021   HGBA1C 8.1 (H) 03/10/2021   HGBA1C 7.4 (H) 11/06/2020   A/P: Update A1c-we may simply consider a higher baseline A1c goal of 8.5 or less-try to increase mobility and work on lifestyle as long as below this goal due to above concerns  #Hypertension S: Compliant with carvedilol, Entresto, Lasix. Blood pressure traditionally runs very  low but these medications have been required due to CHF  BP Readings from Last 3 Encounters:  11/13/21 136/72  11/11/21 134/74  10/14/21 103/68  A/P:  Controlled but high for him (feeling fine) - Continue current medications.    #OSA-compliant with CPAP   #Former smoker-enrolled in lung cancer screening program until no longer qualifies-neck scan appears to be planned in January  Recommended follow up: Return in about 4 months (around 03/14/2022) for follow-up or sooner if needed. Future Appointments  Date Time Provider [place]  12/24/2021  3:40 PM Sueanne Margarita, MD [facility]  01/13/2022  1:00 PM [place] ECHO OP 1 [facility]  01/13/2022  2:00 PM Bensimhon, Shaune Pascal, MD [facility] None  01/26/2022  7:20 AM [facility] DEVICE REMOTES [facility]  04/27/2022  7:20 AM [facility] DEVICE REMOTES [facility]   Lab/Order associations: fasting   ICD-10-CM   1. Chronic systolic heart failure (HCC)  I50.22     2. Chronic atrial fibrillation (HCC)  I48.20     3. Coronary  artery disease involving native coronary artery of native heart without angina pectoris  I25.10     4. Hyperlipidemia associated with type 2 diabetes mellitus ([place])  E11.69    E78.5     5. Controlled type 2 diabetes mellitus without complication, without long-term current use of insulin (HCC)  E11.9 CBC with Differential/Platelet    Comprehensive metabolic panel    Hemoglobin A1c    6. Hypertension associated with diabetes ([place])  E11.59    I15.2     7. OSA (obstructive sleep apnea)  G47.33     8. Implantable cardioverter-defibrillator (ICD) in situ  Z95.810      I,Jada Bradford,acting as a scribe for [name], MD.,have documented all relevant documentation on the behalf of [name], MD,as directed by  [name], MD while in the presence of [name], MD.   I, [name], MD, have reviewed all documentation for this visit. The documentation on 11/13/21 for the exam, diagnosis, procedures, and orders are all accurate and complete.   Return precautions advised.  [name], MD

## 2021-11-11 ENCOUNTER — Other Ambulatory Visit: Payer: Self-pay

## 2021-11-11 ENCOUNTER — Encounter: Payer: Self-pay | Admitting: Physical Medicine and Rehabilitation

## 2021-11-11 ENCOUNTER — Ambulatory Visit: Payer: Medicare HMO | Admitting: Physical Medicine and Rehabilitation

## 2021-11-11 VITALS — BP 134/74 | HR 88

## 2021-11-11 DIAGNOSIS — M47812 Spondylosis without myelopathy or radiculopathy, cervical region: Secondary | ICD-10-CM

## 2021-11-11 DIAGNOSIS — M542 Cervicalgia: Secondary | ICD-10-CM

## 2021-11-11 NOTE — Progress Notes (Signed)
Pt state lower back pain. Pt state walking and standing makes the pain worse. Pt state he takes nothing for the pain. Pt has hx of inj on 10/14/21 pt state it helped 40%-50%.  Numeric Pain Rating Scale and Functional Assessment Average Pain 7 Pain Right Now 4 My pain is intermittent and dull Pain is worse with: walking, bending, and some activites Pain improves with:  None   In the last MONTH (on 0-10 scale) has pain interfered with the following?  1. General activity like being  able to carry out your everyday physical activities such as walking, climbing stairs, carrying groceries, or moving a chair?  Rating(4)  2. Relation with others like being able to carry out your usual social activities and roles such as  activities at home, at work and in your community. Rating(5)  3. Enjoyment of life such that you have  been bothered by emotional problems such as feeling anxious, depressed or irritable?  Rating(6)

## 2021-11-11 NOTE — Progress Notes (Signed)
Carl Wood - 80 y.o. adult MRN [id number]  Date of birth: 06/09/1941  Office Visit Note: Visit Date: 11/11/2021 PCP: Marin Olp, MD Referred by: Marin Olp, MD  Subjective: Chief Complaint  Patient presents with   Lower Back - Pain   HPI: Carl Wood is a 80 y.o. adult who comes in today for evaluation chronic, worsening and severe bilateral neck pain.  Patient reports neck pain has been ongoing for 6 to 8 months and seems to be most severe when he is driving and has to turn his head.  Patient also reports his pain is exacerbated by movement, activity and laying flat to sleep, describes as a tightness and a soreness sensation, currently rates as 8 out of 10.  Patient states he has not tried any treatments for his chronic neck pain at home.  Patient states he did attend formal physical therapy for his neck pain at [facility] several months ago, he reports these treatments did not help to alleviate his pain.  Patient cervical x-ray images from June exhibit extensive multilevel cervical spondylosis and facet hypertrophy.  Patient states that he notices his neck pain is becoming progressively worse and is making it very difficult for him to complete daily tasks and drive his car. Patient denies focal weakness, numbness and tingling. Patient denies recent trauma or falls.   Patient does have a significant cardiac history of CAD, ischemic cardiomyopathy chronic systolic heart failure, atrial fibrillation, pulmonary embolism, long term anticoagulant therapy, and ICD. Patient currently being managed by   Review of Systems  Musculoskeletal:  Positive for neck pain.  Neurological:  Negative for tingling, sensory change, focal weakness and weakness.  All other systems reviewed and are negative. Otherwise per HPI.  Assessment & Plan: Visit Diagnoses:    ICD-10-CM   1. Cervicalgia  M54.2 CT CERVICAL SPINE WO CONTRAST    2. Bilateral neck pain  M54.2      3. Spondylosis without myelopathy or radiculopathy, cervical region  M47.812     4. Facet hypertrophy of cervical region  M47.812        Plan: Findings:  Chronic, worsening and severe bilateral neck pain.  Patient does not exhibit radicular symptoms at this time.  Patient continues to have excruciating pain despite good conservative therapy such as formal physical therapy and rest.  Patient's cervical spine x-rays from June do exhibit extensive multilevel cervical spondylosis and facet hypertrophy.  Patient's clinical presentation and exam could be facet mediated, we also feel that there could be a myofascial component contributing to his pain. Patient is unable to have MRI imaging due to ICD. We believe the next step is to obtain CT imaging of his cervical spine. We will have patient follow-up with [place] after cervical spine CT is obtained for review and to discuss further treatment options.  We did discuss regrouping with our in-house physical therapy team, however patient does not wish to repeat PT at this time.  No red flag symptoms noted upon exam today.   Meds & Orders: No orders of the defined types were placed in this encounter.   Orders Placed This Encounter  Procedures   CT CERVICAL SPINE WO CONTRAST    Follow-up: Return for follow-up after CT of cervical spine is obtained for review.   Procedures: No procedures performed      Clinical History: LUMBAR SPINE - COMPLETE 4+ VIEW   COMPARISON:  08/16/2019   FINDINGS: Frontal, bilateral oblique, lateral views  of the lumbar spine are obtained. There are 5 non-rib-bearing lumbar type vertebral bodies in normal anatomic alignment. No acute displaced fractures. Diffuse lumbar spondylosis and facet hypertrophy is noted, greatest from L3 through S1. Sacroiliac joints are normal.   IMPRESSION: 1. Multilevel spondylosis and facet hypertrophy without significant change since prior exam.     Electronically Signed   By: Randa Ngo M.D.   On: 05/21/2021 09:19   He reports that he quit smoking about 8 years ago. His smoking use included cigarettes. He has a 50.00 pack-year smoking history. He has never used smokeless tobacco.  Recent Labs    03/10/21 1351 07/10/21 1652  HGBA1C 8.1* 8.3*    Objective:  VS:  HT:    WT:   BMI:     BP:134/74  HR:88bpm  TEMP: ( )  RESP:  Physical Exam Vitals and nursing note reviewed.  HENT:     Head: Normocephalic and atraumatic.     Right Ear: External ear normal.     Left Ear: External ear normal.     Nose: Nose normal.     Mouth/Throat:     Mouth: Mucous membranes are moist.  Eyes:     Extraocular Movements: Extraocular movements intact.  Cardiovascular:     Rate and Rhythm: Normal rate.     Pulses: Normal pulses.  Pulmonary:     Effort: Pulmonary effort is normal.  Abdominal:     General: Abdomen is flat. There is no distension.  Musculoskeletal:        General: Tenderness present.     Cervical back: Tenderness present.     Comments: Discomfort noted with flexion, extension and side-to-side rotation. Patient has good strength in the upper extremities including 5 out of 5 strength in wrist extension, long finger flexion and APB.  There is no atrophy of the hands intrinsically.  Sensation intact bilaterally. Negative Hoffman's sign. Negative Spurling's sign.    Skin:    General: Skin is warm and dry.     Capillary Refill: Capillary refill takes less than 2 seconds.  Neurological:     General: No focal deficit present.     Mental Status: He is alert and oriented to person, place, and time.  Psychiatric:        Mood and Affect: Mood normal.    Ortho Exam  Imaging: No results found.  Past Medical/Family/Surgical/Social History: Medications & Allergies reviewed per EMR, new medications updated. Patient Active Problem List   Diagnosis Date Noted   COPD (chronic obstructive pulmonary disease) ([place]) 12/26/2018   Osteoarthritis of right knee 09/22/2018    History of subdural hematoma 11/21/2017   Frontal headache 10/06/2017   Rhinitis 08/22/2017   Dyspnea on exertion 08/22/2017   Ischemic cardiomyopathy 03/28/2017   Chronic atrial fibrillation (HCC)    OSA (obstructive sleep apnea) 95/62/1308   Chronic systolic heart failure ([place]) 08/06/2015   LBBB (left bundle branch block) 08/06/2015   Carotid artery stenosis 03/17/2015   Former smoker 11/15/2014   Actinic keratosis 11/15/2014   History of cardioembolic cerebrovascular accident (CVA) 12/19/2012   Benign neoplasm of colon 12/11/2012   Implantable cardioverter-defibrillator (ICD) in situ 02/17/2012    ventricular tachycardia-non sustained  02/17/2012   History of skin cancer 07/05/2007   Diabetes mellitus type II, controlled ([place]) 10/16/2006   Hyperlipidemia associated with type 2 diabetes mellitus ([place]) 10/16/2006   Hypertension associated with diabetes ([place]) 10/16/2006   CAD (coronary artery disease) 10/16/2006   Chronic pulmonary embolism ([place]) 10/16/2006  Past Medical History:  Diagnosis Date   CAD (coronary artery disease)    CHF (congestive heart failure) (HCC)    Chronic atrial fibrillation (HCC)    Colon polyps    Diabetes mellitus    Diverticulosis of colon    DIVERTICULOSIS, COLON 10/16/2006   Qualifier: Diagnosis of  By: Leanne Chang MD, Bruce     Excessive daytime sleepiness 02/19/2016   Hyperlipidemia    Hypertension    MI (mitral incompetence)    Nephrolithiasis    NEPHROLITHIASIS 06/26/2008   Qualifier: Diagnosis of  By: Sherlynn Stalls, CMA, Cindy     OSA (obstructive sleep apnea) 02/19/2016   Moderate to severe OSA with an AHI of 25/hr and on CPAP at 8cm H2O   PE (pulmonary embolism)    V-tach    Family History  Problem Relation Age of Onset   Bladder Cancer Mother    Heart disease Mother    Colon cancer Father    Diabetes Father    Heart disease Father    Heart attack Child 28   Heart attack Child 23   Past Surgical History:  Procedure Laterality Date   BACK  SURGERY     BASAL CELL CARCINOMA EXCISION     nose   BIV UPGRADE N/A 03/28/2017   Procedure: BiV Upgrade;  Surgeon: Deboraha Sprang, MD;  Location: [facility];  Service: Cardiovascular;  Laterality: N/A;   CARDIAC CATHETERIZATION N/A 01/20/2016   Procedure: Right/Left Heart Cath and Coronary Angiography;  Surgeon: Jolaine Artist, MD;  Location: [place] [facility];  Service: Cardiovascular;  Laterality: N/A;   CARDIAC DEFIBRILLATOR PLACEMENT     medtronic virtuoso   CHOLECYSTECTOMY     COLONOSCOPY  12/11/2012   Procedure: COLONOSCOPY;  Surgeon: Inda Castle, MD;  Location: [facility];  Service: Endoscopy;  Laterality: N/A;   KNEE ARTHROPLASTY     Social History   Occupational History   Occupation: Architect work    Fish farm manager: RETIRED  Tobacco Use   Smoking status: Former    Packs/day: 1.00    Years: 50.00    Pack years: 50.00    Types: Cigarettes    Quit date: 12/15/2012    Years since quitting: 8.9   Smokeless tobacco: Never  Vaping Use   Vaping Use: Never used  Substance and Sexual Activity   Alcohol use: No    Alcohol/week: 0.0 standard drinks   Drug use: No   Sexual activity: Yes    Birth control/protection: None

## 2021-11-13 ENCOUNTER — Other Ambulatory Visit: Payer: Self-pay

## 2021-11-13 ENCOUNTER — Ambulatory Visit (INDEPENDENT_AMBULATORY_CARE_PROVIDER_SITE_OTHER): Payer: Medicare HMO | Admitting: Family Medicine

## 2021-11-13 ENCOUNTER — Encounter: Payer: Self-pay | Admitting: Family Medicine

## 2021-11-13 VITALS — BP 136/72 | HR 82 | Temp 97.6°F | Ht 71.0 in | Wt 204.0 lb

## 2021-11-13 DIAGNOSIS — I2510 Atherosclerotic heart disease of native coronary artery without angina pectoris: Secondary | ICD-10-CM | POA: Diagnosis not present

## 2021-11-13 DIAGNOSIS — E785 Hyperlipidemia, unspecified: Secondary | ICD-10-CM

## 2021-11-13 DIAGNOSIS — Z95810 Presence of automatic (implantable) cardiac defibrillator: Secondary | ICD-10-CM | POA: Diagnosis not present

## 2021-11-13 DIAGNOSIS — I5022 Chronic systolic (congestive) heart failure: Secondary | ICD-10-CM | POA: Diagnosis not present

## 2021-11-13 DIAGNOSIS — I482 Chronic atrial fibrillation, unspecified: Secondary | ICD-10-CM

## 2021-11-13 DIAGNOSIS — E1159 Type 2 diabetes mellitus with other circulatory complications: Secondary | ICD-10-CM

## 2021-11-13 DIAGNOSIS — Z8679 Personal history of other diseases of the circulatory system: Secondary | ICD-10-CM

## 2021-11-13 DIAGNOSIS — I152 Hypertension secondary to endocrine disorders: Secondary | ICD-10-CM

## 2021-11-13 DIAGNOSIS — Z87891 Personal history of nicotine dependence: Secondary | ICD-10-CM

## 2021-11-13 DIAGNOSIS — G4733 Obstructive sleep apnea (adult) (pediatric): Secondary | ICD-10-CM

## 2021-11-13 DIAGNOSIS — E1169 Type 2 diabetes mellitus with other specified complication: Secondary | ICD-10-CM

## 2021-11-13 DIAGNOSIS — E119 Type 2 diabetes mellitus without complications: Secondary | ICD-10-CM | POA: Diagnosis not present

## 2021-11-13 LAB — CBC WITH DIFFERENTIAL/PLATELET
Basophils Absolute: 0 10*3/uL (ref 0.0–0.1)
Basophils Relative: 0.7 % (ref 0.0–3.0)
Eosinophils Absolute: 0.1 10*3/uL (ref 0.0–0.7)
Eosinophils Relative: 2.7 % (ref 0.0–5.0)
HCT: 48.5 % (ref 39.0–52.0)
Hemoglobin: 15.9 g/dL (ref 13.0–17.0)
Lymphocytes Relative: 18.9 % (ref 12.0–46.0)
Lymphs Abs: 1 10*3/uL (ref 0.7–4.0)
MCHC: 32.9 g/dL (ref 30.0–36.0)
MCV: 100.2 fl — ABNORMAL HIGH (ref 78.0–100.0)
Monocytes Absolute: 0.6 10*3/uL (ref 0.1–1.0)
Monocytes Relative: 11.5 % (ref 3.0–12.0)
Neutro Abs: 3.5 10*3/uL (ref 1.4–7.7)
Neutrophils Relative %: 66.2 % (ref 43.0–77.0)
Platelets: 179 10*3/uL (ref 150.0–400.0)
RBC: 4.84 Mil/uL (ref 4.22–5.81)
RDW: 14.2 % (ref 11.5–15.5)
WBC: 5.3 10*3/uL (ref 4.0–10.5)

## 2021-11-13 LAB — COMPREHENSIVE METABOLIC PANEL
ALT: 22 U/L (ref 0–53)
AST: 20 U/L (ref 0–37)
Albumin: 4.2 g/dL (ref 3.5–5.2)
Alkaline Phosphatase: 56 U/L (ref 39–117)
BUN: 19 mg/dL (ref 6–23)
CO2: 31 mEq/L (ref 19–32)
Calcium: 10.3 mg/dL (ref 8.4–10.5)
Chloride: 105 mEq/L (ref 96–112)
Creatinine, Ser: 0.98 mg/dL (ref 0.40–1.50)
GFR: 74.02 mL/min (ref >60.00)
Glucose, Bld: 192 mg/dL — ABNORMAL HIGH (ref 70–99)
Potassium: 5.5 mEq/L — ABNORMAL HIGH (ref 3.5–5.1)
Sodium: 143 mEq/L (ref 135–145)
Total Bilirubin: 0.7 mg/dL (ref 0.2–1.2)
Total Protein: 7.3 g/dL (ref 6.0–8.3)

## 2021-11-13 LAB — HEMOGLOBIN A1C: Hgb A1c MFr Bld: 8.6 % — ABNORMAL HIGH (ref 4.6–6.5)

## 2021-11-13 NOTE — Patient Instructions (Addendum)
Please stop by lab before you go If you have mychart- we will send your results within 3 business days of [place] receiving them.  If you do not have mychart- we will call you about results within 5 business days of [place] receiving them.  *please also note that you will see labs on mychart as soon as they post. I will later go in and write notes on them- will say "notes from Dr. Yong Channel"  Recommended follow up: Return in about 4 months (around 03/14/2022) for follow-up or sooner if needed.  Happy Holidays!

## 2021-11-16 ENCOUNTER — Other Ambulatory Visit: Payer: Self-pay

## 2021-11-16 DIAGNOSIS — D7589 Other specified diseases of blood and blood-forming organs: Secondary | ICD-10-CM

## 2021-11-19 ENCOUNTER — Telehealth: Payer: Self-pay

## 2021-11-19 NOTE — Telephone Encounter (Signed)
Patient needed a lab appointment for repeat labs. Appointment scheduled.   Dr. Yong Channel mentioned adjusting the [facility] and he would like to know why.   He stated that Dr. Yong Channel wants increase his jardiance and wants to know if this will make him use the restroom more.

## 2021-11-19 NOTE — Telephone Encounter (Signed)
Called to see what questions the patient has. Spoke with his wife and she stated that he was unavailable but she will let him know to call us back.

## 2021-11-19 NOTE — Telephone Encounter (Signed)
Patient called in stating that he has a few questions about his lab results and is wondering if someone is able to give him a call back.

## 2021-11-19 NOTE — Telephone Encounter (Signed)
Entresto can cause high potassium levels so if his potassium does not fact come back high then [place] may need to be reduced

## 2021-11-20 ENCOUNTER — Other Ambulatory Visit: Payer: Self-pay

## 2021-11-20 ENCOUNTER — Other Ambulatory Visit (INDEPENDENT_AMBULATORY_CARE_PROVIDER_SITE_OTHER): Payer: Medicare HMO

## 2021-11-20 DIAGNOSIS — D7589 Other specified diseases of blood and blood-forming organs: Secondary | ICD-10-CM | POA: Diagnosis not present

## 2021-11-20 LAB — VITAMIN B12: Vitamin B-12: 455 pg/mL (ref 211–911)

## 2021-11-20 NOTE — Telephone Encounter (Signed)
Called and spoke with patient, he verbalized understanding for the recommendations of Dr. Yong Channel.

## 2021-11-24 LAB — FOLATE RBC: RBC Folate: 566 ng/mL RBC (ref >280)

## 2021-11-26 ENCOUNTER — Telehealth: Payer: Self-pay | Admitting: Family Medicine

## 2021-11-26 ENCOUNTER — Telehealth: Payer: Self-pay

## 2021-11-26 ENCOUNTER — Other Ambulatory Visit (INDEPENDENT_AMBULATORY_CARE_PROVIDER_SITE_OTHER): Payer: Medicare HMO

## 2021-11-26 DIAGNOSIS — E875 Hyperkalemia: Secondary | ICD-10-CM | POA: Diagnosis not present

## 2021-11-26 LAB — BASIC METABOLIC PANEL
BUN: 18 mg/dL (ref 6–23)
CO2: 31 mEq/L (ref 19–32)
Calcium: 9.7 mg/dL (ref 8.4–10.5)
Chloride: 102 mEq/L (ref 96–112)
Creatinine, Ser: 0.93 mg/dL (ref 0.40–1.50)
GFR: 78.8 mL/min (ref >60.00)
Glucose, Bld: 126 mg/dL — ABNORMAL HIGH (ref 70–99)
Potassium: 3.7 mEq/L (ref 3.5–5.1)
Sodium: 139 mEq/L (ref 135–145)

## 2021-11-26 NOTE — Telephone Encounter (Signed)
Patient called in stating he has labs drawn this evening.   States he got home and took the wrap off.  When he did, there was a knot at the injection site.  I have sent patient to triage.

## 2021-11-26 NOTE — Telephone Encounter (Signed)
I called patient to offer 730am appt with [place] for tomorrow.    Patient stated he wanted to wait until tomorrow to see how his arm was doing and would give [place] a call back.

## 2021-11-26 NOTE — Telephone Encounter (Signed)
Potassium was high last week- team was supposed to order BMP but it was not done- will order today since he is in office with wife.

## 2021-11-26 NOTE — Progress Notes (Signed)
Per the orders of Dr. Yong Channel pt is here for Labs, pt tolerated draw well.

## 2021-11-26 NOTE — Telephone Encounter (Signed)
Patient Name: Carl Wood Gender: Male DOB: 12/31/1940 Age: 80 Y 2 M 23 D Return Phone Number: [phone] (Primary) Address: City/ State/ Zip: [place]  [address] Client [name] [facility] [place] [facility] [place] [facility] Provider [name]- MD Contact Type Call Who Is Calling Patient / Member / Family / Caregiver Call Type Triage / Clinical Relationship To Patient Self Return Phone Number [phone] (Primary) Chief Complaint Unclassified Symptom Reason for Call Symptomatic / Request for [name] states he had lab work done today and there states he has a "knot" where blood was drawn. Translation No Nurse Assessment Nurse: Della Goo, RN, Vicente Males Date/Time Eilene Ghazi Time): 11/26/2021 4:25:04 PM Confirm and document reason for call. If symptomatic, describe symptoms. ---Caller states he had lab work done at [employer], got home and noticed a knot where the blood was drawn. About 1-1.25 inches. He is on xarelto. Denies any increase in size since initially noticing it. It is painful. Does the patient have any new or worsening symptoms? ---Yes Will a triage be completed? ---Yes Related visit to physician within the last 2 weeks? ---Yes Does the PT have any chronic conditions? (i.e. diabetes, asthma, this includes High risk factors for pregnancy, etc.) ---Yes List chronic conditions. ---diabetes, heart condition Is this a behavioral health or substance abuse call? ---No Guidelines Guideline Title Affirmed Question Affirmed Notes Nurse Date/Time (Eastern Time) Skin Lump or Localized Swelling [1] Swelling is painful to touch AND [2] no fever Della Goo, RN, Vicente Males 11/26/2021 4:31:47 PM Disp. Time Eilene Ghazi Time) Disposition Final User 11/26/2021 4:34:23 PM See HCP within 4 Hours (or PCP triage) Yes Quandt, RN, Vicente Males Disposition Overriden: See PCP within 24 Hours Override Reason:  Patient's symptoms need a higher level of care Caller Disagree/Comply Comply Caller Understands Yes PreDisposition Did not know what to do Care Advice Given Per Guideline * UCC: Some UCCs can manage patients who are stable and have less serious symptoms (e.g., minor illnesses and injuries). The triager must know the [facility] capabilities before sending a patient there. If unsure, call ahead. CARE ADVICE given per Skin Swelling or Lump (Adult) guideline. SEE HCP (OR PCP TRIAGE) WITHIN 4 HOURS: * IF OFFICE WILL BE CLOSED AND NO PCP (PRIMARY CARE PROVIDER) SECOND-LEVEL TRIAGE: You need to be seen within the next 3 or 4 hours. A nearby Urgent Care Center [facility]) is often a good source of care. Another choice is to go to the ED. Go sooner if you become worse. Comments User: Juanda Crumble, RN Date/Time Eilene Ghazi Time): 11/26/2021 4:34:41 PM caller terminated call prior to completing care advice User: Juanda Crumble, RN Date/Time Eilene Ghazi Time): 11/26/2021 4:35:34 PM dispo upgraded d/t size, rapid onset, and xarelto use Referrals GO TO FACILITY UNDECIDED

## 2021-12-01 ENCOUNTER — Telehealth (HOSPITAL_COMMUNITY): Payer: Self-pay | Admitting: *Deleted

## 2021-12-01 NOTE — Telephone Encounter (Signed)
FYI, see message. 

## 2021-12-01 NOTE — Telephone Encounter (Signed)
Pt left VM requesting samples of Jardiance and Xarelto. Samples left at front desk, left pt VM this was done  Medication Samples have been provided to the patient.  Drug name: Xarelto       Strength: 20 mg        Qty: 4  LOT: [id number], [id number]  Exp.Date: 3/25, 10/24  Dosing instructions: Take 1 tab Daily  The patient has been instructed regarding the correct time, dose, and frequency of taking this medication, including desired effects and most common side effects.   Nira Conn Rabecca Birge 3:05 PM 12/01/2021   Medication Samples have been provided to the patient.  Drug name: Jardiance       Strength: 10 mg        Qty: 3  LOT: [id number]  Exp.Date: 8/24  Dosing instructions: Take 1 tab Daily  The patient has been instructed regarding the correct time, dose, and frequency of taking this medication, including desired effects and most common side effects.   Veona Bittman 3:06 PM 12/01/2021

## 2021-12-04 NOTE — Telephone Encounter (Signed)
Noted lets call to check on patient please to make sure has continued to improve

## 2021-12-04 NOTE — Telephone Encounter (Signed)
LMTCB

## 2021-12-08 ENCOUNTER — Other Ambulatory Visit: Payer: Self-pay

## 2021-12-08 ENCOUNTER — Ambulatory Visit
Admission: RE | Admit: 2021-12-08 | Discharge: 2021-12-08 | Disposition: A | Discharge status: Home / Self Care | Payer: Medicare HMO | Source: Ambulatory Visit | Attending: Physical Medicine and Rehabilitation | Admitting: Physical Medicine and Rehabilitation

## 2021-12-08 DIAGNOSIS — M542 Cervicalgia: Secondary | ICD-10-CM | POA: Diagnosis not present

## 2021-12-13 DIAGNOSIS — J209 Acute bronchitis, unspecified: Secondary | ICD-10-CM | POA: Diagnosis not present

## 2021-12-13 DIAGNOSIS — M791 Myalgia, unspecified site: Secondary | ICD-10-CM | POA: Diagnosis not present

## 2021-12-13 DIAGNOSIS — J019 Acute sinusitis, unspecified: Secondary | ICD-10-CM | POA: Diagnosis not present

## 2021-12-13 DIAGNOSIS — R059 Cough, unspecified: Secondary | ICD-10-CM | POA: Diagnosis not present

## 2021-12-13 DIAGNOSIS — Z20828 Contact with and (suspected) exposure to other viral communicable diseases: Secondary | ICD-10-CM | POA: Diagnosis not present

## 2021-12-14 ENCOUNTER — Telehealth: Payer: Self-pay | Admitting: Physical Medicine and Rehabilitation

## 2021-12-14 NOTE — Telephone Encounter (Signed)
Pt called requesting a call back to reschedule. Please call pt at [phone].

## 2021-12-15 ENCOUNTER — Telehealth (HOSPITAL_COMMUNITY): Payer: Self-pay | Admitting: Pharmacist

## 2021-12-15 ENCOUNTER — Ambulatory Visit: Payer: Medicare HMO | Admitting: Physical Medicine and Rehabilitation

## 2021-12-15 NOTE — Telephone Encounter (Signed)
Advanced Heart Failure Patient Advocate Encounter   Patient was approved to receive Entresto from [employer].  Patient ID: [id number] Effective dates: 12/06/21 through 12/05/22  Audry Riles, PharmD, BCPS, BCCP, CPP Heart Failure Clinic Pharmacist [phone]

## 2021-12-24 ENCOUNTER — Ambulatory Visit: Payer: Medicare HMO | Admitting: Cardiology

## 2021-12-28 ENCOUNTER — Encounter: Payer: Self-pay | Admitting: Physical Medicine and Rehabilitation

## 2021-12-28 ENCOUNTER — Ambulatory Visit: Payer: Medicare HMO | Admitting: Physical Medicine and Rehabilitation

## 2021-12-28 ENCOUNTER — Other Ambulatory Visit: Payer: Self-pay

## 2021-12-28 ENCOUNTER — Other Ambulatory Visit (HOSPITAL_COMMUNITY): Payer: Self-pay | Admitting: Internal Medicine

## 2021-12-28 VITALS — BP 84/59 | HR 85

## 2021-12-28 DIAGNOSIS — M47816 Spondylosis without myelopathy or radiculopathy, lumbar region: Secondary | ICD-10-CM

## 2021-12-28 DIAGNOSIS — M47812 Spondylosis without myelopathy or radiculopathy, cervical region: Secondary | ICD-10-CM

## 2021-12-28 DIAGNOSIS — M542 Cervicalgia: Secondary | ICD-10-CM | POA: Diagnosis not present

## 2021-12-28 NOTE — Progress Notes (Signed)
Pt state neck pain. Pt here today for a CT review.  Numeric Pain Rating Scale and Functional Assessment Average Pain 0   In the last MONTH (on 0-10 scale) has pain interfered with the following?  1. General activity like being  able to carry out your everyday physical activities such as walking, climbing stairs, carrying groceries, or moving a chair?  Rating(10)

## 2021-12-28 NOTE — Progress Notes (Signed)
Carl Wood - 81 y.o. adult MRN [id number]  Date of birth: Jan 12, 1941  Office Visit Note: Visit Date: 12/28/2021 PCP: Marin Olp, MD Referred by: Marin Olp, MD  Subjective: Chief Complaint  Patient presents with   Neck - Pain   HPI: Carl Wood is a 81 y.o. adult who comes in today for evaluation of chronic, worsening and severe bilateral neck pain. Pt reports pain has been ongoing for a year. Patient reports pain is exacerbated by movement and activity, describes as a constant sore and tight sensation, currently rates as 7 out of 10. Patient states his pain is most severe when he moves neck from side to side. Patient reports some relief of pain with home exercise regimen and rest. Patient did attend formal physical therapy for chronic neck issues at [facility] last year and reports no relief of pain with these treatments. Patients recent CT of cervical spine exhibits multi-level facet hypertrophy and multi-level moderate foraminal narrowing. No high grade spinal canal stenosis noted. Patient states he would like to try conservative methods of treatment before proceeding with cervical epidural steroid injection. Patient has been treated for chronic lower back pain in the past by Dr. Magnus Sinning, he did undergo bilateral L3-L4, L4-L5 and L5-S1 radiofrequency ablation in November of 2022. Patient states radiofrequency ablation did not help to alleviate his pain and reports his pain increased post procedure. Patient denies focal weakness, numbness and tingling. Patient denies recent trauma or falls.   Patient does have a significant cardiac history of CAD, ischemic cardiomyopathy chronic systolic heart failure, atrial fibrillation, pulmonary embolism, long term anticoagulant therapy, and ICD. Patient currently being managed by Dr. Adam Phenix with [facility].   Review of Systems  Musculoskeletal:  Positive for myalgias and neck pain.   Neurological:  Negative for tingling, sensory change, focal weakness and weakness.  All other systems reviewed and are negative. Otherwise per HPI.  Assessment & Plan: Visit Diagnoses:    ICD-10-CM   1. Cervicalgia  M54.2     2. Bilateral neck pain  M54.2     3. Spondylosis without myelopathy or radiculopathy, cervical region  M47.812     4. Facet hypertrophy of cervical region  M47.812     5. Spondylosis without myelopathy or radiculopathy, lumbar region  M47.816        Plan: Findings:  Chronic, worsening and severe bilateral neck pain. No radicular symptoms noted. Patient continues to have excruciating pain despite good conservative therapies such as formal physical therapy, home exercise regimen and rest. Patients clinical presentation and exam are consistent with facet mediated pain. We did review patients recent cervical spine CT today using images and spine model. We spoke with patient in detail about treatment options and plan of care. Patient voiced concerns about neck injections and states he would like to hold off on this procedure as his lower back injections seemed to make his pain worse. We would consider performing cervical facet joint injections if warranted. We feel the next step would be to continue conservative therapies at home. We encouraged patient to speak with cardiologist about use of topical pain creams such as Voltaren to ensure these medications do not have any cardiac contraindications. If cardiology approves we instructed patient to use Voltaren gel up to 3 times a day to help with neck pain. We also spoke with him about re-grouping with our in-house physical  therapy team for manual treatments and possible dry needing. Patient  is going to try Voltaren and then he is instructed to let us know how he is feeling, if his pain remains or worsens we will be happy to place referral for formal physical therapy with our in-house team. Patient encouraged to remain active and  to continue home exercises as tolerated. No red flag symptoms noted upon exam today.    Meds & Orders: No orders of the defined types were placed in this encounter.  No orders of the defined types were placed in this encounter.   Follow-up: Return if symptoms worsen or fail to improve.   Procedures: No procedures performed      Clinical History: EXAM: CT CERVICAL SPINE WITHOUT CONTRAST   TECHNIQUE: Multidetector CT imaging of the cervical spine was performed without intravenous contrast. Multiplanar CT image reconstructions were also generated.   COMPARISON:  X-ray cervical spine 05/20/2021   FINDINGS: Alignment: Normal.   Skull base and vertebrae: Multilevel degenerative changes of the spine. Associated at least multilevel moderate to severe osseous neural foraminal stenosis. No severe osseous central canal stenosis. No acute fracture. No aggressive appearing focal osseous lesion or focal pathologic process.   Soft tissues and spinal canal: No prevertebral fluid or swelling. No visible canal hematoma.   Upper chest: Unremarkable.   Other: Atherosclerotic plaque. Partially visualized to central venous leads.   IMPRESSION: 1. No acute displaced fracture or traumatic listhesis of the cervical spine. 2. Multilevel degenerative changes of the spine. Associated at least multilevel moderate to severe osseous neural foraminal stenosis. Consider MRI for further evaluation.     Electronically Signed   By: Iven Finn M.D.   On: 12/08/2021 16:26   He reports that he quit smoking about 9 years ago. His smoking use included cigarettes. He has a 50.00 pack-year smoking history. He has never used smokeless tobacco.  Recent Labs    03/10/21 1351 07/10/21 1652 11/13/21 1320  HGBA1C 8.1* 8.3* 8.6*    Objective:  VS:  HT:     WT:    BMI:      BP: (!) 84/59   HR:85bpm   TEMP: ( )   RESP:  Physical Exam Vitals and nursing note reviewed.  HENT:     Head: Normocephalic  and atraumatic.     Right Ear: External ear normal.     Left Ear: External ear normal.     Nose: Nose normal.     Mouth/Throat:     Mouth: Mucous membranes are moist.  Eyes:     Extraocular Movements: Extraocular movements intact.  Cardiovascular:     Rate and Rhythm: Normal rate.     Pulses: Normal pulses.  Pulmonary:     Effort: Pulmonary effort is normal.  Abdominal:     General: Abdomen is flat. There is no distension.  Musculoskeletal:        General: Tenderness present.     Cervical back: Tenderness present.     Comments: Discomfort noted with flexion, extension and side-to-side rotation. Patient has good strength in the upper extremities including 5 out of 5 strength in wrist extension, long finger flexion and APB.  There is no atrophy of the hands intrinsically.  Sensation intact bilaterally. Negative Hoffman's sign. Negative Spurling's sign.    Skin:    General: Skin is warm and dry.     Capillary Refill: Capillary refill takes less than 2 seconds.  Neurological:     General: No focal deficit present.     Mental Status: He is alert and  oriented to person, place, and time.  Psychiatric:        Mood and Affect: Mood normal.    Ortho Exam  Imaging: No results found.  Past Medical/Family/Surgical/Social History: Medications & Allergies reviewed per EMR, new medications updated. Patient Active Problem List   Diagnosis Date Noted   COPD (chronic obstructive pulmonary disease) ([place]) 12/26/2018   Osteoarthritis of right knee 09/22/2018   History of subdural hematoma 11/21/2017   Frontal headache 10/06/2017   Rhinitis 08/22/2017   Dyspnea on exertion 08/22/2017   Ischemic cardiomyopathy 03/28/2017   Chronic atrial fibrillation (HCC)    OSA (obstructive sleep apnea) 36/62/9476   Chronic systolic heart failure ([place]) 08/06/2015   LBBB (left bundle branch block) 08/06/2015   Carotid artery stenosis 03/17/2015   Former smoker 11/15/2014   Actinic keratosis 11/15/2014    History of cardioembolic cerebrovascular accident (CVA) 12/19/2012   Benign neoplasm of colon 12/11/2012   Implantable cardioverter-defibrillator (ICD) in situ 02/17/2012    ventricular tachycardia-non sustained  02/17/2012   History of skin cancer 07/05/2007   Diabetes mellitus type II, controlled ([place]) 10/16/2006   Hyperlipidemia associated with type 2 diabetes mellitus ([place]) 10/16/2006   Hypertension associated with diabetes ([place]) 10/16/2006   CAD (coronary artery disease) 10/16/2006   Chronic pulmonary embolism ([place]) 10/16/2006   Past Medical History:  Diagnosis Date   CAD (coronary artery disease)    CHF (congestive heart failure) ([place])    Chronic atrial fibrillation ([place])    Colon polyps    Diabetes mellitus    Diverticulosis of colon    DIVERTICULOSIS, COLON 10/16/2006   Qualifier: Diagnosis of  By: Leanne Chang MD, Bruce     Excessive daytime sleepiness 02/19/2016   Hyperlipidemia    Hypertension    MI (mitral incompetence)    Nephrolithiasis    NEPHROLITHIASIS 06/26/2008   Qualifier: Diagnosis of  By: Sherlynn Stalls, CMA, Cindy     OSA (obstructive sleep apnea) 02/19/2016   Moderate to severe OSA with an AHI of 25/hr and on CPAP at 8cm H2O   PE (pulmonary embolism)    V-tach    Family History  Problem Relation Age of Onset   Bladder Cancer Mother    Heart disease Mother    Colon cancer Father    Diabetes Father    Heart disease Father    Heart attack Child 28   Heart attack Child 22   Past Surgical History:  Procedure Laterality Date   BACK SURGERY     BASAL CELL CARCINOMA EXCISION     nose   BIV UPGRADE N/A 03/28/2017   Procedure: BiV Upgrade;  Surgeon: Deboraha Sprang, MD;  Location: [facility];  Service: Cardiovascular;  Laterality: N/A;   CARDIAC CATHETERIZATION N/A 01/20/2016   Procedure: Right/Left Heart Cath and Coronary Angiography;  Surgeon: Jolaine Artist, MD;  Location: [facility];  Service: Cardiovascular;  Laterality: N/A;   CARDIAC  DEFIBRILLATOR PLACEMENT     medtronic virtuoso   CHOLECYSTECTOMY     COLONOSCOPY  12/11/2012   Procedure: COLONOSCOPY;  Surgeon: Inda Castle, MD;  Location: [facility];  Service: Endoscopy;  Laterality: N/A;   KNEE ARTHROPLASTY     Social History   Occupational History   Occupation: Architect work    Fish farm manager: RETIRED  Tobacco Use   Smoking status: Former    Packs/day: 1.00    Years: 50.00    Pack years: 50.00    Types: Cigarettes    Quit date: 12/15/2012  Years since quitting: 9.0   Smokeless tobacco: Never  Vaping Use   Vaping Use: Never used  Substance and Sexual Activity   Alcohol use: No    Alcohol/week: 0.0 standard drinks   Drug use: No   Sexual activity: Yes    Birth control/protection: None

## 2021-12-30 ENCOUNTER — Other Ambulatory Visit (HOSPITAL_COMMUNITY): Payer: Self-pay

## 2021-12-30 ENCOUNTER — Telehealth: Payer: Self-pay | Admitting: Internal Medicine

## 2021-12-30 MED ORDER — CARVEDILOL 6.25 MG PO TABS: 6.2500 mg | ORAL_TABLET | Freq: Two times a day (BID) | ORAL | 4 refills | Status: DC

## 2021-12-30 NOTE — Telephone Encounter (Signed)
Patient needs nurse to check with Dr. Caryl Comes if it's okay for him to use Voltaren for he the arthritis in his knee.

## 2021-12-31 NOTE — Telephone Encounter (Signed)
Spoke with pt and advised per pharmacy team: There is a slight increase in bleeding with voltaren gel and Xarelto, but it is probably the safest option compared to an oral anti inflammatory. Pt verbalizes understanding and thanked Therapist, sports for the phone call.

## 2021-12-31 NOTE — Telephone Encounter (Signed)
Attempted phone call to pt.  Left voicemail message and advised to contact RN at [phone].  RN will also send a message through MyChart that pt may respond back to.

## 2021-12-31 NOTE — Telephone Encounter (Signed)
There is a slight increase in bleeding with voltaren gel and Xarelto, but it is probably the safest option compared to an oral anti inflammatory.

## 2021-12-31 NOTE — Telephone Encounter (Signed)
Spoke with pt who states he is wanting to know if it is OK to use the Voltaren Gel for knee pain with his current medications.  Pt advised will forward to pharmacy team for review.  Pt verbalizes understanding and agrees with current plan.

## 2022-01-01 ENCOUNTER — Telehealth: Payer: Self-pay | Admitting: Physical Medicine and Rehabilitation

## 2022-01-01 NOTE — Telephone Encounter (Signed)
Patient would like Megan to call him. No reason given. [phone]

## 2022-01-08 ENCOUNTER — Telehealth (HOSPITAL_COMMUNITY): Payer: Self-pay

## 2022-01-08 NOTE — Telephone Encounter (Signed)
Pt walked in clinic today requesting samples for entresto 49/51mg  as he waits for his shipment. 1 bottle 28 tabs provided to patient.

## 2022-01-11 ENCOUNTER — Other Ambulatory Visit: Payer: Self-pay | Admitting: Family Medicine

## 2022-01-13 ENCOUNTER — Ambulatory Visit (HOSPITAL_COMMUNITY)
Admission: RE | Admit: 2022-01-13 | Discharge: 2022-01-13 | Disposition: A | Discharge status: Home / Self Care | Payer: Medicare HMO | Source: Ambulatory Visit | Attending: Family Medicine | Admitting: Family Medicine

## 2022-01-13 ENCOUNTER — Encounter (HOSPITAL_COMMUNITY): Payer: Self-pay | Admitting: Internal Medicine

## 2022-01-13 ENCOUNTER — Ambulatory Visit (HOSPITAL_BASED_OUTPATIENT_CLINIC_OR_DEPARTMENT_OTHER)
Admission: RE | Admit: 2022-01-13 | Discharge: 2022-01-13 | Disposition: A | Discharge status: Home / Self Care | Payer: Medicare HMO | Source: Ambulatory Visit | Attending: Internal Medicine | Admitting: Internal Medicine

## 2022-01-13 ENCOUNTER — Other Ambulatory Visit: Payer: Self-pay

## 2022-01-13 VITALS — BP 92/58 | HR 76 | Wt 201.8 lb

## 2022-01-13 DIAGNOSIS — I255 Ischemic cardiomyopathy: Secondary | ICD-10-CM | POA: Insufficient documentation

## 2022-01-13 DIAGNOSIS — I5022 Chronic systolic (congestive) heart failure: Secondary | ICD-10-CM | POA: Insufficient documentation

## 2022-01-13 DIAGNOSIS — Z87891 Personal history of nicotine dependence: Secondary | ICD-10-CM | POA: Insufficient documentation

## 2022-01-13 DIAGNOSIS — Z95810 Presence of automatic (implantable) cardiac defibrillator: Secondary | ICD-10-CM | POA: Diagnosis not present

## 2022-01-13 DIAGNOSIS — Z9989 Dependence on other enabling machines and devices: Secondary | ICD-10-CM | POA: Diagnosis not present

## 2022-01-13 DIAGNOSIS — Z8673 Personal history of transient ischemic attack (TIA), and cerebral infarction without residual deficits: Secondary | ICD-10-CM | POA: Diagnosis not present

## 2022-01-13 DIAGNOSIS — I252 Old myocardial infarction: Secondary | ICD-10-CM | POA: Diagnosis not present

## 2022-01-13 DIAGNOSIS — I482 Chronic atrial fibrillation, unspecified: Secondary | ICD-10-CM

## 2022-01-13 DIAGNOSIS — I2510 Atherosclerotic heart disease of native coronary artery without angina pectoris: Secondary | ICD-10-CM

## 2022-01-13 DIAGNOSIS — Z79899 Other long term (current) drug therapy: Secondary | ICD-10-CM | POA: Diagnosis not present

## 2022-01-13 DIAGNOSIS — I110 Hypertensive heart disease with heart failure: Secondary | ICD-10-CM | POA: Diagnosis not present

## 2022-01-13 DIAGNOSIS — R531 Weakness: Secondary | ICD-10-CM | POA: Insufficient documentation

## 2022-01-13 DIAGNOSIS — Z7901 Long term (current) use of anticoagulants: Secondary | ICD-10-CM | POA: Insufficient documentation

## 2022-01-13 DIAGNOSIS — Z9181 History of falling: Secondary | ICD-10-CM | POA: Insufficient documentation

## 2022-01-13 DIAGNOSIS — Z7984 Long term (current) use of oral hypoglycemic drugs: Secondary | ICD-10-CM | POA: Insufficient documentation

## 2022-01-13 DIAGNOSIS — G4733 Obstructive sleep apnea (adult) (pediatric): Secondary | ICD-10-CM

## 2022-01-13 DIAGNOSIS — I472 Ventricular tachycardia, unspecified: Secondary | ICD-10-CM | POA: Diagnosis not present

## 2022-01-13 DIAGNOSIS — E119 Type 2 diabetes mellitus without complications: Secondary | ICD-10-CM | POA: Diagnosis not present

## 2022-01-13 LAB — BASIC METABOLIC PANEL
Anion gap: 11 (ref 5–15)
BUN: 17 mg/dL (ref 8–23)
CO2: 26 mmol/L (ref 22–32)
Calcium: 10.2 mg/dL (ref 8.9–10.3)
Chloride: 101 mmol/L (ref 98–111)
Creatinine, Ser: 1.03 mg/dL (ref 0.61–1.24)
GFR, Estimated: >60 mL/min (ref >60)
Glucose, Bld: 225 mg/dL — ABNORMAL HIGH (ref 70–99)
Potassium: 4.6 mmol/L (ref 3.5–5.1)
Sodium: 138 mmol/L (ref 135–145)

## 2022-01-13 LAB — ECHOCARDIOGRAM COMPLETE
AV Mean grad: 3 mmHg
AV Peak grad: 5.2 mmHg
Ao pk vel: 1.14 m/s
Area-P 1/2: 2.82 cm2

## 2022-01-13 LAB — BRAIN NATRIURETIC PEPTIDE: B Natriuretic Peptide: 79.4 pg/mL (ref 0.0–100.0)

## 2022-01-13 LAB — DIGOXIN LEVEL: Digoxin Level: 0.3 ng/mL — ABNORMAL LOW (ref 0.8–2.0)

## 2022-01-13 MED ORDER — ENTRESTO 24-26 MG PO TABS: 1.0000 | ORAL_TABLET | Freq: Two times a day (BID) | ORAL | 11 refills | Status: DC

## 2022-01-13 MED ORDER — CARVEDILOL 6.25 MG PO TABS: 3.1250 mg | ORAL_TABLET | Freq: Two times a day (BID) | ORAL | 4 refills | Status: DC

## 2022-01-13 MED ORDER — PERFLUTREN LIPID MICROSPHERE
1.0000 mL | INTRAVENOUS | Status: DC | PRN
  Administered 2022-01-13: 5 mL via INTRAVENOUS
  Filled 2022-01-13: qty 10

## 2022-01-13 NOTE — Patient Instructions (Signed)
Medication Changes:  Decrease your Carvedilol to 3.125mg  Twice daily  Entresto changed to 24/46 Twice daily   Lab Work:  Labs done today, your results will be available in MyChart, we will contact you for abnormal readings.   Testing/Procedures:  none  Referrals:  none  Special Instructions // Education:  noe  Follow-Up in: 4 months  At the [facility], you and your health needs are our priority. We have a designated team specialized in the treatment of Heart Failure. This Care Team includes your primary Heart Failure Specialized Cardiologist (physician), Advanced Practice Providers (APPs- Physician Assistants and Nurse Practitioners), and Pharmacist who all work together to provide you with the care you need, when you need it.   You may see any of the following providers on your designated Care Team at your next follow up:  Dr Glori Bickers Dr Haynes Kerns, NP Lyda Jester, [place] [facility] [place], [place] Audry Riles, PharmD   Please be sure to bring in all your medications bottles to every appointment.   Need to Contact us:  If you have any questions or concerns before your next appointment please send [place] a message through [place] or call our office at [phone].    TO LEAVE A MESSAGE FOR THE NURSE SELECT OPTION 2, PLEASE LEAVE A MESSAGE INCLUDING: YOUR NAME DATE OF BIRTH CALL BACK NUMBER REASON FOR CALL**this is important as we prioritize the call backs  YOU WILL RECEIVE A CALL BACK THE SAME DAY AS LONG AS YOU CALL BEFORE 4:00 PM

## 2022-01-13 NOTE — Progress Notes (Signed)
°  Echocardiogram 2D Echocardiogram has been performed.  [name] 01/13/2022, 2:00 PM

## 2022-01-13 NOTE — Progress Notes (Signed)
Advanced [facility] Failure Clinic Note    Date:  01/13/2022   ID:  Carl Wood, DOB [birth year], MRN [id number]  Location: Home  Provider location: [address], [place] Type of Visit: Established patient PCP:  Marin Olp, MD  Cardiologist:Dr Turner  Primary HF: Dr Haroldine Laws  EP: Dr Caryl Comes   History of Present Illness:  Carl Wood is a 81 y/o male with CAD s/p MI 1983, ICM with chronic systolic heart failure s/p Medtronic single chamber ICD (EF 20%) CVA 2014, DM2 VT, permanent A fib on Xarelto, SDH 2019, OSA, and former smoker.   Had CRT-D upgrade in April 2018.    In 12/18 saw Dr. Tomi Likens for chronic HA. Found to have small SDHs that were felt to be chronic after a previous fall when trimming vines. Xarelto stopped. F/u C 2/19 SDHs resolved. Xarelto restarted in 2/19  Today he returns for HF follow up with his wife. At last visit very weak with worsening HF. Not felt to be candidate for advanced therapies. Suggested walking 10-87mins/day to try to build stamina. Carvedilol decreased. Says he has no energy. Weak when he gets up. No edema, orthopnea or PND. Wears CPAP  Echo today 01/13/22 EF 25-30% RV ok Personally reviewed  ICD interrogation: No VT. Volume ok. 100 biv pacing. Activity level ~ 1hr day Personally reviewed   ABIs 2017 No PAD  12/2019 Echo  EF 20-25%  Past Medical History:  Diagnosis Date   CAD (coronary artery disease)    CHF (congestive heart failure) (HCC)    Chronic atrial fibrillation (HCC)    Colon polyps    Diabetes mellitus    Diverticulosis of colon    DIVERTICULOSIS, COLON 10/16/2006   Qualifier: Diagnosis of  By: Leanne Chang MD, Carl Wood     Excessive daytime sleepiness 02/19/2016   Hyperlipidemia    Hypertension    MI (mitral incompetence)    Nephrolithiasis    NEPHROLITHIASIS 06/26/2008   Qualifier: Diagnosis of  By: Sherlynn Stalls, CMA, Cindy     OSA (obstructive sleep apnea) 02/19/2016   Moderate to severe OSA with an AHI of 25/hr and on  CPAP at 8cm H2O   PE (pulmonary embolism)    V-tach    Past Surgical History:  Procedure Laterality Date   BACK SURGERY     BASAL CELL CARCINOMA EXCISION     nose   BIV UPGRADE N/A 03/28/2017   Procedure: BiV Upgrade;  Surgeon: Deboraha Sprang, MD;  Location: [facility];  Service: Cardiovascular;  Laterality: N/A;   CARDIAC CATHETERIZATION N/A 01/20/2016   Procedure: Right/Left Heart Cath and Coronary Angiography;  Surgeon: Jolaine Artist, MD;  Location: [place] [facility];  Service: Cardiovascular;  Laterality: N/A;   CARDIAC DEFIBRILLATOR PLACEMENT     medtronic virtuoso   CHOLECYSTECTOMY     COLONOSCOPY  12/11/2012   Procedure: COLONOSCOPY;  Surgeon: Inda Castle, MD;  Location: [facility];  Service: Endoscopy;  Laterality: N/A;   KNEE ARTHROPLASTY       Current Outpatient Medications  Medication Sig Dispense Refill   ACCU-CHEK AVIVA PLUS test strip USE  STRIP TO CHECK GLUCOSE ONCE DAILY 100 each 4   Ascorbic Acid (VITAMIN C ER PO) Take 1 tablet by mouth daily.     carvedilol (COREG) 6.25 MG tablet Take 1 tablet (6.25 mg total) by mouth 2 (two) times daily with a meal. 60 tablet 4   digoxin (LANOXIN) 0.25 MG tablet Take 1/2 (  one-half) tablet by mouth once daily 45 tablet 2   furosemide (LASIX) 20 MG tablet Take 1 tablet by mouth once a week 12 tablet 0   glimepiride (AMARYL) 4 MG tablet TAKE 2 TABLETS BY MOUTH ONCE DAILY WITH BREAKFAST 180 tablet 0   JARDIANCE 10 MG TABS tablet Take 1 tablet by mouth once daily 30 tablet 0   metFORMIN (GLUCOPHAGE-XR) 500 MG 24 hr tablet Take 2 tablets (1,000 mg total) by mouth in the morning and at bedtime. 360 tablet 3   rosuvastatin (CRESTOR) 40 MG tablet Take 40 mg by mouth daily.     sacubitril-valsartan (ENTRESTO) 49-51 MG Take 1 tablet by mouth 2 (two) times daily. 180 tablet 3   XARELTO 20 MG TABS tablet Take 1 tablet by mouth once daily 30 tablet 11   No current facility-administered medications for this encounter.    Facility-Administered Medications Ordered in Other Encounters  Medication Dose Route Frequency Provider Last Rate Last Admin   perflutren lipid microspheres (DEFINITY) IV suspension  1-10 mL Intravenous PRN Ladarren Steiner, Shaune Pascal, MD   5 mL at 01/13/22 1400    Allergies:   Penicillins and Sulfamethoxazole   Social History:  The patient  reports that he quit smoking about 9 years ago. His smoking use included cigarettes. He has a 50.00 pack-year smoking history. He has never used smokeless tobacco. He reports that he does not drink alcohol and does not use drugs.   Family History:  The patient's family history includes Bladder Cancer in his mother; Colon cancer in his father; Diabetes in his father; Heart attack (age of onset: 34) in his child; Heart attack (age of onset: 103) in his child; Heart disease in his father and mother.   ROS:  Please see the history of present illness.   All other systems are personally reviewed and negative.   Vitals:   01/13/22 1420  BP: (!) 92/58  Pulse: 76  SpO2: 96%    Exam:  General:  Weak appearing. No resp difficulty HEENT: normal Neck: supple. no JVD. Carotids 2+ bilat; no bruits. No lymphadenopathy or thryomegaly appreciated. Cor: PMI nondisplaced. Regular rate & rhythm. No rubs, gallops or murmurs. Lungs: clear Abdomen: soft, nontender, nondistended. No hepatosplenomegaly. No bruits or masses. Good bowel sounds. Extremities: no cyanosis, clubbing, rash, edema Neuro: alert & orientedx3, cranial nerves grossly intact. moves all 4 extremities w/o difficulty. Affect pleasant  Recent Labs: 09/11/2021: B Natriuretic Peptide 72.5; TSH 1.919 11/13/2021: ALT 22; Hemoglobin 15.9; Platelets 179.0 11/26/2021: BUN 18; Creatinine, Ser 0.93; Potassium 3.7; Sodium 139   Wt Readings from Last 3 Encounters:  01/13/22 91.5 kg (201 lb 12.8 oz)  11/13/21 92.5 kg (204 lb)  09/11/21 93.5 kg (206 lb 3.2 oz)    ASSESSMENT AND PLAN:  1. Chronic Systolic HF: ICM,  s/p Medtronic Bi-V ICD (upgrade 4/18). Echo 09/2016 EF 25-30%. ECHO 8/19 EF 20% - Echo 12/12/19 EF 20-25% RV ok  - Stable NYHA III - Volume status ok on exam and ICD interrogation - Echo today 01/13/22 EF stable 25-30% RV ok Personally reviewed - BP low - Decrease carvedilol to 3.125 bid - Continue digoxin.  - Decrease Entresto to 24/26 mg BID. - Continue Jardiance. - No spiro with history of hyperkalemia and soft BP.  - Stable. NYHA III. Not candidate for advanced therapies. Will cut meds back as above. Encouraged him to be more active.    2. OSA - Continue CPAP. Follows with Dr. Radford Pax - Follows with Dr. Radford Pax.  Downloads have looked good    3. Chronic A fib - Rate controlled.  - Xarelto restarted after resolution of SDH. No evidence of current bleeding   4. CAD: - Has known distal LAD lesion 95% and mid LAD 40%. No benefit to revascularization given wall motion abnormality.  - No s/s angina - Continue statin. Off ASA with Xarelto   Signed, Glori Bickers, MD  2:44 PM   [facility] [address] [facility] [place] [address] [phone] (office) [phone] (fax)

## 2022-01-19 ENCOUNTER — Other Ambulatory Visit: Payer: Self-pay | Admitting: *Deleted

## 2022-01-19 DIAGNOSIS — Z87891 Personal history of nicotine dependence: Secondary | ICD-10-CM

## 2022-01-22 ENCOUNTER — Other Ambulatory Visit (HOSPITAL_COMMUNITY): Payer: Self-pay | Admitting: Internal Medicine

## 2022-01-22 DIAGNOSIS — I6523 Occlusion and stenosis of bilateral carotid arteries: Secondary | ICD-10-CM

## 2022-01-26 ENCOUNTER — Ambulatory Visit (INDEPENDENT_AMBULATORY_CARE_PROVIDER_SITE_OTHER): Payer: Medicare HMO

## 2022-01-26 DIAGNOSIS — I255 Ischemic cardiomyopathy: Secondary | ICD-10-CM | POA: Diagnosis not present

## 2022-01-26 LAB — CUP PACEART REMOTE DEVICE CHECK
Battery Remaining Longevity: 19 mo
Battery Voltage: 2.9 V
Brady Statistic AP VP Percent: 0 %
Brady Statistic AP VS Percent: 0 %
Brady Statistic AS VP Percent: 0 %
Brady Statistic AS VS Percent: 0 %
Brady Statistic RA Percent Paced: 0 %
Brady Statistic RV Percent Paced: 97.11 %
Date Time Interrogation Session: 20230221044223
HighPow Impedance: 82 Ohm
Implantable Lead Implant Date: 20010723
Implantable Lead Implant Date: 20180423
Implantable Lead Location: 753858
Implantable Lead Location: 753860
Implantable Lead Model: 6943
Implantable Pulse Generator Implant Date: 20180423
Lead Channel Impedance Value: 1026 Ohm
Lead Channel Impedance Value: 266 Ohm
Lead Channel Impedance Value: 270.667
Lead Channel Impedance Value: 270.667
Lead Channel Impedance Value: 279.525
Lead Channel Impedance Value: 279.525
Lead Channel Impedance Value: 304 Ohm
Lead Channel Impedance Value: 399 Ohm
Lead Channel Impedance Value: 4047 Ohm
Lead Channel Impedance Value: 532 Ohm
Lead Channel Impedance Value: 532 Ohm
Lead Channel Impedance Value: 551 Ohm
Lead Channel Impedance Value: 589 Ohm
Lead Channel Impedance Value: 950 Ohm
Lead Channel Impedance Value: 950 Ohm
Lead Channel Impedance Value: 988 Ohm
Lead Channel Impedance Value: 988 Ohm
Lead Channel Impedance Value: 988 Ohm
Lead Channel Pacing Threshold Amplitude: 1.5 V
Lead Channel Pacing Threshold Amplitude: 2 V
Lead Channel Pacing Threshold Pulse Width: 0.4 ms
Lead Channel Pacing Threshold Pulse Width: 1 ms
Lead Channel Sensing Intrinsic Amplitude: 12.75 mV
Lead Channel Sensing Intrinsic Amplitude: 12.75 mV
Lead Channel Setting Pacing Amplitude: 2.5 V
Lead Channel Setting Pacing Amplitude: 3 V
Lead Channel Setting Pacing Pulse Width: 0.4 ms
Lead Channel Setting Pacing Pulse Width: 1 ms
Lead Channel Setting Sensing Sensitivity: 0.3 mV

## 2022-01-27 ENCOUNTER — Telehealth: Payer: Self-pay | Admitting: Family Medicine

## 2022-01-27 NOTE — Chronic Care Management (AMB) (Signed)
°  Chronic Care Management   Note  01/27/2022 Name: [name] MRN: [id number] DOB: September 02, 1941  Carl Wood is a 81 y.o. year old adult who is a primary care patient of Yong Channel, Brayton Mars, MD. I reached out to Carl Wood by phone today in response to a referral sent by [name] PCP, Marin Olp, MD.   Mr. Ground was given information about Chronic Care Management services today including:  CCM service includes personalized support from designated clinical staff supervised by his physician, including individualized plan of care and coordination with other care providers 24/7 contact phone numbers for assistance for urgent and routine care needs. Service will only be billed when office clinical staff spend 20 minutes or more in a month to coordinate care. Only one practitioner may furnish and bill the service in a calendar month. The patient may stop CCM services at any time (effective at the end of the month) by phone call to the office staff.   Patient did not agree to enrollment in care management services and does not wish to consider at this time.  Follow up plan:pt states he already does this with his other Physician   [place]

## 2022-01-28 ENCOUNTER — Ambulatory Visit (INDEPENDENT_AMBULATORY_CARE_PROVIDER_SITE_OTHER)
Admission: RE | Admit: 2022-01-28 | Discharge: 2022-01-28 | Disposition: A | Discharge status: Home / Self Care | Payer: Medicare HMO | Source: Ambulatory Visit | Attending: Acute Care | Admitting: Acute Care

## 2022-01-28 ENCOUNTER — Other Ambulatory Visit: Payer: Self-pay

## 2022-01-28 DIAGNOSIS — Z87891 Personal history of nicotine dependence: Secondary | ICD-10-CM

## 2022-02-01 ENCOUNTER — Ambulatory Visit (HOSPITAL_COMMUNITY)
Admission: RE | Admit: 2022-02-01 | Discharge: 2022-02-01 | Disposition: A | Discharge status: Home / Self Care | Payer: Medicare HMO | Source: Ambulatory Visit | Attending: Cardiology | Admitting: Cardiology

## 2022-02-01 ENCOUNTER — Encounter (HOSPITAL_COMMUNITY): Payer: Self-pay

## 2022-02-01 ENCOUNTER — Other Ambulatory Visit: Payer: Self-pay

## 2022-02-01 DIAGNOSIS — I6523 Occlusion and stenosis of bilateral carotid arteries: Secondary | ICD-10-CM

## 2022-02-01 DIAGNOSIS — Z87891 Personal history of nicotine dependence: Secondary | ICD-10-CM

## 2022-02-02 NOTE — Progress Notes (Signed)
Remote ICD transmission.   

## 2022-02-11 ENCOUNTER — Telehealth (HOSPITAL_COMMUNITY): Payer: Self-pay | Admitting: *Deleted

## 2022-02-11 NOTE — Telephone Encounter (Signed)
Pt called stating he went to have carotid dopplers performed on 2/27 but was told insurance would not pay for it because they only cover carotid dopplers every two years. Pt asked if he really needs to have carotid dopplers this year. ? ?Routed to [place] for advice ?

## 2022-02-12 ENCOUNTER — Encounter: Payer: Self-pay | Admitting: Cardiology

## 2022-02-12 ENCOUNTER — Ambulatory Visit: Payer: Medicare HMO | Admitting: Cardiology

## 2022-02-12 ENCOUNTER — Other Ambulatory Visit: Payer: Self-pay

## 2022-02-12 VITALS — BP 90/60 | HR 88 | Ht 71.0 in | Wt 205.0 lb

## 2022-02-12 DIAGNOSIS — G4733 Obstructive sleep apnea (adult) (pediatric): Secondary | ICD-10-CM | POA: Diagnosis not present

## 2022-02-12 DIAGNOSIS — I152 Hypertension secondary to endocrine disorders: Secondary | ICD-10-CM | POA: Diagnosis not present

## 2022-02-12 DIAGNOSIS — E1159 Type 2 diabetes mellitus with other circulatory complications: Secondary | ICD-10-CM

## 2022-02-12 NOTE — Patient Instructions (Signed)

## 2022-02-12 NOTE — Telephone Encounter (Signed)
Yes our office ordered them. Left detailed vm.  ?

## 2022-02-12 NOTE — Progress Notes (Signed)
? ?Cardiology Office Note ? ?ID:  Carl Wood, DOB 05-28-1941, MRN [id number] ? ?PCP:  Marin Olp, MD  ?Cardiologist:  Glori Bickers, MD ?Sleep Medicine:  Fransico Him, MD ?Electrophysiologist:  None  ? ?Chief Complaint:  OSA ? ?History of Present Illness:   ? ?Carl Wood is a 81 y.o. adult with a hx of moderate to severe OSA with an AHI of 25.2/hr and is on CPAP at 8cm H2O.   ? ?He is doing well with his CPAP device and thinks that he has gotten used to it.  He tolerates the mask and feels the pressure is adequate.  He does not feel rested in the am but gets up hourly to urinate. He will take a nap during the day.   He  doe have problems with mouth and nasal dryness.  He uses a nasal mask with chin strap.  ?Prior CV studies:   ?The following studies were reviewed today: ? ?PAP compliance download ? ?Past Medical History:  ?Diagnosis Date  ? CAD (coronary artery disease)   ? CHF (congestive heart failure) ([place])   ? Chronic atrial fibrillation (HCC)   ? Colon polyps   ? Diabetes mellitus   ? Diverticulosis of colon   ? DIVERTICULOSIS, COLON 10/16/2006  ? Qualifier: Diagnosis of  By: Leanne Chang MD, [place]    ? Excessive daytime sleepiness 02/19/2016  ? Hyperlipidemia   ? Hypertension   ? MI (mitral incompetence)   ? Nephrolithiasis   ? NEPHROLITHIASIS 06/26/2008  ? Qualifier: Diagnosis of  By: Sherlynn Stalls, [place], [place]    ? OSA (obstructive sleep apnea) 02/19/2016  ? Moderate to severe OSA with an AHI of 25/hr and on CPAP at 8cm H2O  ? PE (pulmonary embolism)   ? V-tach   ? ?Past Surgical History:  ?Procedure Laterality Date  ? BACK SURGERY    ? BASAL CELL CARCINOMA EXCISION    ? nose  ? BIV UPGRADE N/A 03/28/2017  ? Procedure: BiV Upgrade;  Surgeon: Deboraha Sprang, MD;  Location: [facility];  Service: Cardiovascular;  Laterality: N/A;  ? CARDIAC CATHETERIZATION N/A 01/20/2016  ? Procedure: Right/Left Heart Cath and Coronary Angiography;  Surgeon: Jolaine Artist, MD;  Location: [facility];   Service: Cardiovascular;  Laterality: N/A;  ? CARDIAC DEFIBRILLATOR PLACEMENT    ? medtronic virtuoso  ? CHOLECYSTECTOMY    ? COLONOSCOPY  12/11/2012  ? Procedure: COLONOSCOPY;  Surgeon: Inda Castle, MD;  Location: [facility];  Service: Endoscopy;  Laterality: N/A;  ? KNEE ARTHROPLASTY    ?  ? ?Current Meds  ?Medication Sig  ? ACCU-CHEK AVIVA PLUS test strip USE  STRIP TO CHECK GLUCOSE ONCE DAILY  ? Ascorbic Acid (VITAMIN C ER PO) Take 1 tablet by mouth daily.  ? carvedilol (COREG) 6.25 MG tablet Take 0.5 tablets (3.125 mg total) by mouth 2 (two) times daily with a meal.  ? digoxin (LANOXIN) 0.25 MG tablet Take 1/2 (one-half) tablet by mouth once daily  ? furosemide (LASIX) 20 MG tablet Take 1 tablet by mouth once a week  ? glimepiride (AMARYL) 4 MG tablet TAKE 2 TABLETS BY MOUTH ONCE DAILY WITH BREAKFAST  ? JARDIANCE 10 MG TABS tablet Take 1 tablet by mouth once daily  ? metFORMIN (GLUCOPHAGE-XR) 500 MG 24 hr tablet Take 2 tablets (1,000 mg total) by mouth in the morning and at bedtime.  ? rosuvastatin (CRESTOR) 40 MG tablet Take 40 mg by mouth daily.  ? sacubitril-valsartan (  ENTRESTO) 24-26 MG Take 1 tablet by mouth 2 (two) times daily.  ? XARELTO 20 MG TABS tablet Take 1 tablet by mouth once daily  ?  ? ?Allergies:   Penicillins and Sulfamethoxazole  ? ?Social History  ? ?Tobacco Use  ? Smoking status: Former  ?  Packs/day: 1.00  ?  Years: 50.00  ?  Pack years: 50.00  ?  Types: Cigarettes  ?  Quit date: 12/15/2012  ?  Years since quitting: 9.1  ? Smokeless tobacco: Never  ?Vaping Use  ? Vaping Use: Never used  ?Substance Use Topics  ? Alcohol use: No  ?  Alcohol/week: 0.0 standard drinks  ? Drug use: No  ?  ? ?Family Hx: ?The patient's family history includes Bladder Cancer in his mother; Colon cancer in his father; Diabetes in his father; Heart attack (age of onset: 75) in his child; Heart attack (age of onset: 68) in his child; Heart disease in his father and mother. ? ?ROS:   ?Please see the history of  present illness.    ? ?All other systems reviewed and are negative. ? ? ?Labs/Other Tests and Data Reviewed:   ? ?Recent Labs: ?09/11/2021: TSH 1.919 ?11/13/2021: ALT 22; Hemoglobin 15.9; Platelets 179.0 ?01/13/2022: B Natriuretic Peptide 79.4; BUN 17; Creatinine, Ser 1.03; Potassium 4.6; Sodium 138  ? ?Recent Lipid Panel ?Lab Results  ?Component Value Date/Time  ? CHOL 112 03/10/2021 01:51 PM  ? TRIG 119.0 03/10/2021 01:51 PM  ? TRIG 53 10/10/2006 11:13 AM  ? HDL 38.20 (L) 03/10/2021 01:51 PM  ? CHOLHDL 3 03/10/2021 01:51 PM  ? LDLCALC 50 03/10/2021 01:51 PM  ? [place] 76 07/03/2020 02:17 PM  ? LDLDIRECT 77 11/06/2020 02:28 PM  ? ? ?Wt Readings from Last 3 Encounters:  ?02/12/22 205 lb (93 kg)  ?01/13/22 201 lb 12.8 oz (91.5 kg)  ?11/13/21 204 lb (92.5 kg)  ?  ? ?Objective:   ? ?Vital Signs:  BP 90/60   Pulse 88   Ht '5\' 11"'$  (1.803 m)   Wt 205 lb (93 kg)   SpO2 97%   BMI 28.59 kg/m?   ? ?GEN: Well nourished, well developed in no acute distress ?HEENT: Normal ?NECK: No JVD; No carotid bruits ?LYMPHATICS: No lymphadenopathy ?CARDIAC:RRR, no murmurs, rubs, gallops ?RESPIRATORY:  Clear to auscultation without rales, wheezing or rhonchi  ?ABDOMEN: Soft, non-tender, non-distended ?MUSCULOSKELETAL:  No edema; No deformity  ?SKIN: Warm and dry ?NEUROLOGIC:  Alert and oriented x 3 ?PSYCHIATRIC:  Normal affect   ?ASSESSMENT & PLAN:   ? ?1.  OSA - The patient is tolerating PAP therapy well without any problems. The PAP download performed by his DME was personally reviewed and interpreted by me today and showed an AHI of 1 /hr on 8 cm H2O with 80% compliance in using more than 4 hours nightly.  The patient has been using and benefiting from PAP use and will continue to benefit from therapy.  ?-I have encouraged him to try to adjust the humidity upwards to help with mouth and nose dryness. ?  ?2.  HTN ?-BP is well controlled on exam today and actually is soft today.  He has not had any dizziness or syncope. I will let Dr.  Haroldine Laws know.  ?-Continue prescription drug management carvedilol 3.125 mg twice daily, digoxin 0.125 mg daily and Entresto 24-26 mg twice daily with as needed refills. ? ? ?Medication Adjustments/Labs and Tests Ordered: ?Current medicines are reviewed at length with the patient today.  Concerns  regarding medicines are outlined above.  ?Tests Ordered: ?No orders of the defined types were placed in this encounter. ? ?Medication Changes: ?No orders of the defined types were placed in this encounter. ? ? ?Disposition:  Follow up in 1 year(s) ? ?Signed, ?Fransico Him, MD  ?02/12/2022 3:32 PM    ?Secretary ?

## 2022-02-23 ENCOUNTER — Other Ambulatory Visit (HOSPITAL_COMMUNITY): Payer: Self-pay

## 2022-02-23 ENCOUNTER — Telehealth (HOSPITAL_COMMUNITY): Payer: Self-pay | Admitting: *Deleted

## 2022-02-23 ENCOUNTER — Telehealth (HOSPITAL_COMMUNITY): Payer: Self-pay | Admitting: Pharmacy Technician

## 2022-02-23 NOTE — Telephone Encounter (Signed)
Advanced Heart Failure Patient Advocate Encounter ? ?The patient was approved for a second grant through the [employer] that will help cover the cost of Jardiance. The billing information remains the same, patient's pharmacy already has it on file. ? ?Member ID - [id number] ?Disease Fund - Heart Failure ?2nd grant amount - $1,200 ?Total remaining balance - $1,200 ?Eligibility End Date - 05/31/2022 ?Claims Submission End Date - 07/30/2022 ? ?Charlann Boxer, CPhT ? ?

## 2022-02-23 NOTE — Telephone Encounter (Signed)
Pt left VM requesting samples of Xarelto. Pt aware that samples are at the front desk. ? ?Medication Samples have been provided to the patient. ? ?Drug name: Xarelto       Strength: '20mg'$         Qty: 4 bottles  LOT: [id number]  Exp.Date: 10/24 ? ?Dosing instructions: Take 1 tablet by mouth daily.  ? ?The patient has been instructed regarding the correct time, dose, and frequency of taking this medication, including desired effects and most common side effects.  ? ?Carl Wood ?9:01 AM ?02/23/2022 ? ?

## 2022-02-24 DIAGNOSIS — L821 Other seborrheic keratosis: Secondary | ICD-10-CM | POA: Diagnosis not present

## 2022-02-24 DIAGNOSIS — B078 Other viral warts: Secondary | ICD-10-CM | POA: Diagnosis not present

## 2022-02-24 DIAGNOSIS — C44529 Squamous cell carcinoma of skin of other part of trunk: Secondary | ICD-10-CM | POA: Diagnosis not present

## 2022-02-24 DIAGNOSIS — R238 Other skin changes: Secondary | ICD-10-CM | POA: Diagnosis not present

## 2022-02-24 DIAGNOSIS — L570 Actinic keratosis: Secondary | ICD-10-CM | POA: Diagnosis not present

## 2022-03-02 ENCOUNTER — Encounter (HOSPITAL_COMMUNITY): Payer: Medicare HMO | Admitting: Cardiology

## 2022-03-13 ENCOUNTER — Other Ambulatory Visit: Payer: Self-pay | Admitting: Family Medicine

## 2022-03-17 DIAGNOSIS — L570 Actinic keratosis: Secondary | ICD-10-CM | POA: Diagnosis not present

## 2022-03-22 ENCOUNTER — Other Ambulatory Visit: Payer: Self-pay | Admitting: Family Medicine

## 2022-03-25 ENCOUNTER — Ambulatory Visit: Payer: Medicare HMO | Admitting: Family Medicine

## 2022-03-29 ENCOUNTER — Ambulatory Visit (INDEPENDENT_AMBULATORY_CARE_PROVIDER_SITE_OTHER): Payer: Medicare HMO | Admitting: Family Medicine

## 2022-03-29 ENCOUNTER — Encounter: Payer: Self-pay | Admitting: Family Medicine

## 2022-03-29 VITALS — BP 104/64 | HR 85 | Temp 97.7°F | Ht 71.0 in | Wt 202.4 lb

## 2022-03-29 DIAGNOSIS — I5022 Chronic systolic (congestive) heart failure: Secondary | ICD-10-CM | POA: Diagnosis not present

## 2022-03-29 DIAGNOSIS — J449 Chronic obstructive pulmonary disease, unspecified: Secondary | ICD-10-CM

## 2022-03-29 DIAGNOSIS — E119 Type 2 diabetes mellitus without complications: Secondary | ICD-10-CM

## 2022-03-29 DIAGNOSIS — I482 Chronic atrial fibrillation, unspecified: Secondary | ICD-10-CM | POA: Diagnosis not present

## 2022-03-29 DIAGNOSIS — I10 Essential (primary) hypertension: Secondary | ICD-10-CM | POA: Diagnosis not present

## 2022-03-29 DIAGNOSIS — I2782 Chronic pulmonary embolism: Secondary | ICD-10-CM | POA: Diagnosis not present

## 2022-03-29 NOTE — Progress Notes (Signed)
?Phone [phone] ?In person visit ?  ?Subjective:  ? ?Carl Wood is a 81 y.o. year old very pleasant adult patient who presents for/with See problem oriented charting ?Chief Complaint  ?Patient presents with  ? Hypertension  ? Diabetes  ? Back Pain  ?  Pt c/o neck and back pain that has been bothering him over a year.  ? left hip pain  ?  Pt c/o hip for past 2 months.  ? ? ?This visit occurred during the SARS-CoV-2 public health emergency.  Safety protocols were in place, including screening questions prior to the visit, additional usage of staff PPE, and extensive cleaning of exam room while observing appropriate contact time as indicated for disinfecting solutions.  ? ?Past Medical History-  ?Patient Active Problem List  ? Diagnosis Date Noted  ? COPD (chronic obstructive pulmonary disease) ([place]) 12/26/2018  ?  Priority: High  ? History of subdural hematoma 11/21/2017  ?  Priority: High  ? Chronic atrial fibrillation (HCC)   ?  Priority: High  ? Chronic systolic heart failure ([place]) 08/06/2015  ?  Priority: High  ? Former smoker 11/15/2014  ?  Priority: High  ? History of cardioembolic cerebrovascular accident (CVA) 12/19/2012  ?  Priority: High  ?  ventricular tachycardia-non sustained  02/17/2012  ?  Priority: High  ? Diabetes mellitus type II, controlled ([place]) 10/16/2006  ?  Priority: High  ? CAD (coronary artery disease) 10/16/2006  ?  Priority: High  ? Chronic pulmonary embolism ([place]) 10/16/2006  ?  Priority: High  ? Dyspnea on exertion 08/22/2017  ?  Priority: Medium   ? OSA (obstructive sleep apnea) 02/19/2016  ?  Priority: Medium   ? Carotid artery stenosis 03/17/2015  ?  Priority: Medium   ? Implantable cardioverter-defibrillator (ICD) in situ 02/17/2012  ?  Priority: Medium   ? History of skin cancer 07/05/2007  ?  Priority: Medium   ? Hyperlipidemia associated with type 2 diabetes mellitus ([place]) 10/16/2006  ?  Priority: Medium   ? Essential hypertension 10/16/2006  ?  Priority: Medium   ?  Osteoarthritis of right knee 09/22/2018  ?  Priority: Low  ? Frontal headache 10/06/2017  ?  Priority: Low  ? Rhinitis 08/22/2017  ?  Priority: Low  ? LBBB (left bundle branch block) 08/06/2015  ?  Priority: Low  ? Actinic keratosis 11/15/2014  ?  Priority: Low  ? Benign neoplasm of colon 12/11/2012  ?  Priority: Low  ? Ischemic cardiomyopathy 03/28/2017  ? ? ?Medications- reviewed and updated ?Current Outpatient Medications  ?Medication Sig Dispense Refill  ? ACCU-CHEK AVIVA PLUS test strip USE  STRIP TO CHECK GLUCOSE ONCE DAILY 100 each 4  ? Ascorbic Acid (VITAMIN C ER PO) Take 1 tablet by mouth daily.    ? carvedilol (COREG) 6.25 MG tablet Take 0.5 tablets (3.125 mg total) by mouth 2 (two) times daily with a meal. 60 tablet 4  ? digoxin (LANOXIN) 0.25 MG tablet Take 1/2 (one-half) tablet by mouth once daily 45 tablet 2  ? furosemide (LASIX) 20 MG tablet Take 1 tablet by mouth once a week 12 tablet 0  ? glimepiride (AMARYL) 4 MG tablet TAKE 2 TABLETS BY MOUTH ONCE DAILY WITH BREAKFAST 180 tablet 0  ? JARDIANCE 10 MG TABS tablet Take 1 tablet by mouth once daily 30 tablet 0  ? metFORMIN (GLUCOPHAGE-XR) 500 MG 24 hr tablet TAKE 2 TABLETS BY MOUTH IN THE MORNING AND AT BEDTIME 360 tablet 0  ?  rosuvastatin (CRESTOR) 40 MG tablet Take 1 tablet by mouth once daily 90 tablet 0  ? sacubitril-valsartan (ENTRESTO) 24-26 MG Take 1 tablet by mouth 2 (two) times daily. 60 tablet 11  ? XARELTO 20 MG TABS tablet Take 1 tablet by mouth once daily 30 tablet 11  ? ?No current facility-administered medications for this visit.  ? ?  ?Objective:  ?BP 104/64   Pulse 85   Temp 97.7 ?F (36.5 ?C)   Ht '5\' 11"'$  (1.803 m)   Wt 202 lb 6.4 oz (91.8 kg)   SpO2 98%   BMI 28.23 kg/m?  ?Gen: NAD, resting comfortably ?CV: RRR no murmurs rubs or gallops ?Lungs: CTAB no crackles, wheeze, rhonchi ?Abdomen: soft/nontender/nondistended/normal bowel sounds.  ?Ext: no edema ?Skin: warm, dry ? ?  ? ?Assessment and Plan  ? ?#Chronic systolic heart  failure-follows with Dr. Haroldine Laws ?#ICD in place with history of ventricular tachycardia ?S: Compliant with carvedilol 3.125 mg twice a day, digoxin 0.25 mg, Lasix 20 mg-once a week or less perhaps 10 days if gains weight, Entresto 24-26 mg.  Also on Jardiance for diabetes which is favorable for heart failure patients ?-ICD monitored by cardiology ?-Weight down 2 pounds from last visit and no increased edema  ?A/P: CHF appears stable-continue current medication ?- ICD in place and monitored by cardiology  ?  ?# Atrial fibrillation chronic/ history of cardioemolic CVA/history subdural hematoma ?S: Compliant with carvedilol 3.125 mg twice a day for rate control and Xarelto 20 mg for anticoagulation. ?-Does have history of subdural hematoma-Xarelto was held short-term but was restarted once resolved ?-History of cardioembolic strokes we prefer to have him on Xarelto as long as no falls ?A/P: Appropriately rate controlled and anticoagulated-continue current medication ? ?#CAD/hyperlipidemia/carotid artery stenosis ?S: History of MI in 1983.  Denies history of stents or bypass-streptokinase was used in the past per patient. ?- no chest pain or shortness of breath ?-Due to bleeding risk is on Xarelto alone and not on aspirin ?-Patient compliant with rosuvastatin 40 mg daily.  ?Lab Results  ?Component Value Date  ? CHOL 112 03/10/2021  ? HDL 38.20 (L) 03/10/2021  ? [place] 50 03/10/2021  ? LDLDIRECT 77 11/06/2020  ? TRIG 119.0 03/10/2021  ? CHOLHDL 3 03/10/2021  ? A/P: CAD remains stable/asymptomatic-shortness of breath thought related to COPD.  Continue Xarelto alone.  For lipids continue rosuvastatin 40 mg-update full lipid panel-consider Zetia if needed ?-Carotid artery stenosis in 2015.  Only 1 to 39% stenosis-stable in 2022-likely repeat in 2 years-continue risk factor modification  ? ?#Chronic pulmonary embolism-also are also prevent recurrence of pulmonary embolism and DVT-presumed stable on Xarelto 20 mg-continue  current medication  ? ?#COPD ?S: Compliant with Symbicort in the past. He came off of this and hasnt felt any different. Rare wheeze ?A/P: overall stable- continue to monitor without meds.   ? ?# Diabetes ?S: Compliant with metformin '1000mg'$  extended release BID, jardiance '10mg'$  (q2 hour urination so wants to avoid increase ), amaryl 8 mg ?CBGs- 101 this AM- lowest it has been in a while- usually 130 but can be lower ?Lab Results  ?Component Value Date  ? HGBA1C 8.6 (H) 11/13/2021  ? HGBA1C 8.3 (H) 07/10/2021  ? HGBA1C 8.1 (H) 03/10/2021  ?A/P: Hopefully improved-continue current medication for now-targeting A1c under 8.5 ? ?#Hypertension ?S: Compliant with carvedilol, Entresto, Lasix.  Blood pressure traditionally runs very low but these medications have been required due to CHF ?- not feeling poorly  with position change ?BP Readings  from Last 3 Encounters:  ?03/29/22 104/64  ?02/12/22 90/60  ?01/13/22 (!) 92/58  ? A/P: blood pressure looks better with reduced coreg- no orthostatic symptoms - continue current meds ? ?#OSA-compliant with CPAP- just had visit with Dr. Radford Pax ? ?#Had a few back procedures with Dr. Ernestina Patches- unfortunately no relief of pain ?- ongoing cervicalgia as well- had worked with PT for this and back and no help ?-may return to consider facet joint injections in neck if a1c under 8 with Dr. Ernestina Patches ? ?Recommended follow up: Return in about 4 months (around 07/29/2022) for physical or sooner if needed.Schedule b4 you leave. ?Future Appointments  ?Date Time Provider [place]  ?04/19/2022  2:15 PM Deboraha Sprang, MD [facility]  ?04/27/2022  7:20 AM [facility] DEVICE REMOTES [facility]  ?06/04/2022 11:00 AM Bensimhon, Shaune Pascal, MD [facility] None  ?07/27/2022  7:20 AM [facility] DEVICE REMOTES [facility]  ?10/26/2022  7:20 AM [facility] DEVICE REMOTES [facility]  ?01/25/2023  7:20 AM [facility] DEVICE REMOTES [facility]   ?04/26/2023  7:20 AM [facility] DEVICE REMOTES [facility]  ?07/26/2023  7:20 AM [facility] DEVICE REMOTES [facility]  ? ?Lab/Order associations: NOT fasting- [place]

## 2022-03-29 NOTE — Patient Instructions (Addendum)
Please stop by lab before you go ?If you have mychart- we will send your results within 3 business days of [place] receiving them.  ?If you do not have mychart- we will call you about results within 5 business days of [place] receiving them.  ?*please also note that you will see labs on mychart as soon as they post. I will later go in and write notes on them- will say "notes from Dr. Yong Channel"  ? ?No changes today unless labs lead [place] to make changes ? ?Could do a steroid shot if a1c is under 8 and Dr. Ernestina Patches recommends ? ?Recommended follow up: Return in about 4 months (around 07/29/2022) for physical or sooner if needed.Schedule b4 you leave. ?

## 2022-03-30 LAB — CBC WITH DIFFERENTIAL/PLATELET
Basophils Absolute: 0.1 10*3/uL (ref 0.0–0.1)
Basophils Relative: 0.9 % (ref 0.0–3.0)
Eosinophils Absolute: 0.2 10*3/uL (ref 0.0–0.7)
Eosinophils Relative: 3.5 % (ref 0.0–5.0)
HCT: 46.1 % (ref 39.0–52.0)
Hemoglobin: 15.4 g/dL (ref 13.0–17.0)
Lymphocytes Relative: 14.3 % (ref 12.0–46.0)
Lymphs Abs: 1 10*3/uL (ref 0.7–4.0)
MCHC: 33.3 g/dL (ref 30.0–36.0)
MCV: 98.5 fl (ref 78.0–100.0)
Monocytes Absolute: 0.6 10*3/uL (ref 0.1–1.0)
Monocytes Relative: 9.1 % (ref 3.0–12.0)
Neutro Abs: 4.9 10*3/uL (ref 1.4–7.7)
Neutrophils Relative %: 72.2 % (ref 43.0–77.0)
Platelets: 166 10*3/uL (ref 150.0–400.0)
RBC: 4.68 Mil/uL (ref 4.22–5.81)
RDW: 14.2 % (ref 11.5–15.5)
WBC: 6.7 10*3/uL (ref 4.0–10.5)

## 2022-03-30 LAB — COMPREHENSIVE METABOLIC PANEL
ALT: 19 U/L (ref 0–53)
AST: 20 U/L (ref 0–37)
Albumin: 4.1 g/dL (ref 3.5–5.2)
Alkaline Phosphatase: 62 U/L (ref 39–117)
BUN: 21 mg/dL (ref 6–23)
CO2: 28 mEq/L (ref 19–32)
Calcium: 9.1 mg/dL (ref 8.4–10.5)
Chloride: 104 mEq/L (ref 96–112)
Creatinine, Ser: 1.08 mg/dL (ref 0.40–1.50)
GFR: 65.7 mL/min (ref >60.00)
Glucose, Bld: 185 mg/dL — ABNORMAL HIGH (ref 70–99)
Potassium: 4.1 mEq/L (ref 3.5–5.1)
Sodium: 139 mEq/L (ref 135–145)
Total Bilirubin: 0.6 mg/dL (ref 0.2–1.2)
Total Protein: 6.6 g/dL (ref 6.0–8.3)

## 2022-03-30 LAB — LIPID PANEL
Cholesterol: 118 mg/dL (ref 0–200)
HDL: 37.1 mg/dL — ABNORMAL LOW (ref >39.00)
LDL Cholesterol: 44 mg/dL (ref 0–99)
NonHDL: 80.87
Total CHOL/HDL Ratio: 3
Triglycerides: 185 mg/dL — ABNORMAL HIGH (ref 0.0–149.0)
VLDL: 37 mg/dL (ref 0.0–40.0)

## 2022-03-30 LAB — HEMOGLOBIN A1C: Hgb A1c MFr Bld: 7.9 % — ABNORMAL HIGH (ref 4.6–6.5)

## 2022-04-12 ENCOUNTER — Other Ambulatory Visit: Payer: Self-pay | Admitting: Family Medicine

## 2022-04-19 ENCOUNTER — Encounter: Payer: Medicare HMO | Admitting: Internal Medicine

## 2022-04-26 ENCOUNTER — Telehealth: Payer: Self-pay | Admitting: Family Medicine

## 2022-04-26 NOTE — Telephone Encounter (Signed)
Spoke with patient he declined AWV do not call °

## 2022-04-27 ENCOUNTER — Ambulatory Visit (INDEPENDENT_AMBULATORY_CARE_PROVIDER_SITE_OTHER): Payer: Medicare HMO

## 2022-04-27 DIAGNOSIS — I255 Ischemic cardiomyopathy: Secondary | ICD-10-CM | POA: Diagnosis not present

## 2022-04-28 LAB — CUP PACEART REMOTE DEVICE CHECK
Battery Remaining Longevity: 17 mo
Battery Voltage: 2.89 V
Brady Statistic AP VP Percent: 0 %
Brady Statistic AP VS Percent: 0 %
Brady Statistic AS VP Percent: 0 %
Brady Statistic AS VS Percent: 0 %
Brady Statistic RA Percent Paced: 0 %
Brady Statistic RV Percent Paced: 97.95 %
Date Time Interrogation Session: 20230523022604
HighPow Impedance: 79 Ohm
Implantable Lead Implant Date: 20010723
Implantable Lead Implant Date: 20180423
Implantable Lead Location: 753858
Implantable Lead Location: 753860
Implantable Lead Model: 6943
Implantable Pulse Generator Implant Date: 20180423
Lead Channel Impedance Value: 1007 Ohm
Lead Channel Impedance Value: 1026 Ohm
Lead Channel Impedance Value: 270.667
Lead Channel Impedance Value: 270.667
Lead Channel Impedance Value: 275.5 Ohm
Lead Channel Impedance Value: 283.733
Lead Channel Impedance Value: 289.049
Lead Channel Impedance Value: 304 Ohm
Lead Channel Impedance Value: 4047 Ohm
Lead Channel Impedance Value: 418 Ohm
Lead Channel Impedance Value: 532 Ohm
Lead Channel Impedance Value: 551 Ohm
Lead Channel Impedance Value: 551 Ohm
Lead Channel Impedance Value: 608 Ohm
Lead Channel Impedance Value: 950 Ohm
Lead Channel Impedance Value: 950 Ohm
Lead Channel Impedance Value: 988 Ohm
Lead Channel Impedance Value: 988 Ohm
Lead Channel Pacing Threshold Amplitude: 1.5 V
Lead Channel Pacing Threshold Amplitude: 1.875 V
Lead Channel Pacing Threshold Pulse Width: 0.4 ms
Lead Channel Pacing Threshold Pulse Width: 1 ms
Lead Channel Sensing Intrinsic Amplitude: 10.25 mV
Lead Channel Sensing Intrinsic Amplitude: 10.25 mV
Lead Channel Setting Pacing Amplitude: 2.5 V
Lead Channel Setting Pacing Amplitude: 3.25 V
Lead Channel Setting Pacing Pulse Width: 0.4 ms
Lead Channel Setting Pacing Pulse Width: 1 ms
Lead Channel Setting Sensing Sensitivity: 0.3 mV

## 2022-05-12 NOTE — Progress Notes (Signed)
Remote ICD transmission.   

## 2022-05-14 ENCOUNTER — Encounter (HOSPITAL_COMMUNITY): Payer: Medicare HMO | Admitting: Internal Medicine

## 2022-05-17 ENCOUNTER — Ambulatory Visit: Payer: Medicare HMO | Admitting: Internal Medicine

## 2022-05-17 VITALS — BP 108/56 | HR 86 | Wt 202.0 lb

## 2022-05-17 DIAGNOSIS — I482 Chronic atrial fibrillation, unspecified: Secondary | ICD-10-CM | POA: Diagnosis not present

## 2022-05-17 DIAGNOSIS — I255 Ischemic cardiomyopathy: Secondary | ICD-10-CM | POA: Diagnosis not present

## 2022-05-17 DIAGNOSIS — Z95810 Presence of automatic (implantable) cardiac defibrillator: Secondary | ICD-10-CM | POA: Diagnosis not present

## 2022-05-17 NOTE — Patient Instructions (Signed)

## 2022-05-17 NOTE — Progress Notes (Signed)
Patient Care Team: Marin Olp, MD as PCP - General (Family Medicine) Sueanne Margarita, MD as PCP - Sleep Medicine (Cardiology) Bensimhon, Shaune Pascal, MD as PCP - Advanced Heart Failure (Cardiology) Deboraha Sprang, MD as Consulting Physician (Cardiology) Magdalen Spatz, NP as Nurse Practitioner (Pulmonary Disease) Pieter Partridge, DO as Consulting Physician (Neurology) Opthamology, [facility] as Consulting Physician (Ophthalmology) Macario Carls, MD as Consulting Physician (Dermatology)   HPI  CC  Ventricular tachycardia and CAD  Carl Wood is a 81 y.o. adult is seen in followup for ischemic heart disease with ICD implanted for ventricular tachycardia. He has history of bypass grafting and depressed left ventricular function. He has a history of recurrent ventricular tachycardia--most recently polymorphic in september 2021  associated with low-normal magnesium.     Permanent atrial fibrillation.  Anticoagulated with Xarelto.  12/18 evaluation for chronic headaches showed bilateral small subdurals.  Xarelto was stopped -- subdurals resolved and Xarelto resumed. no bleeding  CRT upgrade 4/18.--He had an IVCD but anterior precordial notching.   The patient denies chest pain, nocturnal dyspnea, orthopnea or peripheral edema.  There have been no palpitations, lightheadedness or syncope. Dyspnea on exertion up [place]    DATE TEST EF   7/16 Echo    20 %   2/17 Cath   LAD disease  Nonviable ant wall   10/17 Echo   20-25%   8/19 Echo  20%   1/21 Echo  20-25%   2/23 Echo   25-30%      Date Cr K Hgb Dig  1/21 1.07 4.0 16.2 0.5  4/22  0.98 4.1 16.1 0.5  4/23 1.08 4.1 15.4 0.3 (2/23)      Thromboembolic risk factors ( age -46 , HTN-1, TIA/CVA-2, DM-1, Vasc disease -1, CHF-1) for a CHADSVASc Score of 7   Past Medical History:  Diagnosis Date   CAD (coronary artery disease)    CHF (congestive heart failure) (HCC)    Chronic atrial fibrillation (HCC)    Colon polyps     Diabetes mellitus    Diverticulosis of colon    DIVERTICULOSIS, COLON 10/16/2006   Qualifier: Diagnosis of  By: Leanne Chang MD, [place]     Excessive daytime sleepiness 02/19/2016   Hyperlipidemia    Hypertension    MI (mitral incompetence)    Nephrolithiasis    NEPHROLITHIASIS 06/26/2008   Qualifier: Diagnosis of  By: Sherlynn Stalls, CMA, [place]     OSA (obstructive sleep apnea) 02/19/2016   Moderate to severe OSA with an AHI of 25/hr and on CPAP at 8cm H2O   PE (pulmonary embolism)    V-tach [facility])     Past Surgical History:  Procedure Laterality Date   BACK SURGERY     BASAL CELL CARCINOMA EXCISION     nose   BIV UPGRADE N/A 03/28/2017   Procedure: BiV Upgrade;  Surgeon: Deboraha Sprang, MD;  Location: [facility];  Service: Cardiovascular;  Laterality: N/A;   CARDIAC CATHETERIZATION N/A 01/20/2016   Procedure: Right/Left Heart Cath and Coronary Angiography;  Surgeon: Jolaine Artist, MD;  Location: [facility];  Service: Cardiovascular;  Laterality: N/A;   CARDIAC DEFIBRILLATOR PLACEMENT     medtronic virtuoso   CHOLECYSTECTOMY     COLONOSCOPY  12/11/2012   Procedure: COLONOSCOPY;  Surgeon: Inda Castle, MD;  Location: [facility];  Service: Endoscopy;  Laterality: N/A;   KNEE ARTHROPLASTY      Current Outpatient Medications  Medication Sig Dispense Refill   ACCU-CHEK AVIVA PLUS test strip USE  STRIP TO CHECK GLUCOSE ONCE DAILY 100 each 4   Ascorbic Acid (VITAMIN C ER PO) Take 1 tablet by mouth daily.     carvedilol (COREG) 6.25 MG tablet Take 0.5 tablets (3.125 mg total) by mouth 2 (two) times daily with a meal. 60 tablet 4   digoxin (LANOXIN) 0.25 MG tablet Take 1/2 (one-half) tablet by mouth once daily 45 tablet 2   furosemide (LASIX) 20 MG tablet Take 1 tablet by mouth once a week 12 tablet 0   glimepiride (AMARYL) 4 MG tablet TAKE 2 TABLETS BY MOUTH ONCE DAILY WITH BREAKFAST 180 tablet 0   JARDIANCE 10 MG TABS tablet Take 1 tablet by mouth once daily 30 tablet 0    metFORMIN (GLUCOPHAGE-XR) 500 MG 24 hr tablet TAKE 2 TABLETS BY MOUTH IN THE MORNING AND AT BEDTIME 360 tablet 0   rosuvastatin (CRESTOR) 40 MG tablet Take 1 tablet by mouth once daily 90 tablet 0   sacubitril-valsartan (ENTRESTO) 24-26 MG Take 1 tablet by mouth 2 (two) times daily. 60 tablet 11   XARELTO 20 MG TABS tablet Take 1 tablet by mouth once daily 30 tablet 11   No current facility-administered medications for this visit.    Allergies  Allergen Reactions   Penicillins Other (See Comments)    Patient passed out   Sulfamethoxazole Rash    Review of Systems negative except from HPI and PMH  Physical Exam BP (!) 108/56   Pulse 86   Wt 202 lb (91.6 kg)   SpO2 94%   BMI 28.17 kg/m  Well developed and well nourished in no acute distress HENT normal Neck supple with JVP-flat Clear Device pocket well healed; without hematoma or erythema.  There is no tethering  Irregularly irregular rate and rhythm, no  gallop No  murmur Abd-soft with active BS No Clubbing cyanosis  edema Skin-warm and dry A & Oriented  Grossly normal sensory and motor function  ECG atrial fibrillation with ventricular pacing and upright QRS lead V1 negative QRS lead I QRSd is 160 ms  Assessment and  Plan  Atrial fibrillation-permanent  IVCD  Ischemic cardiomyopathy prior bypass surgery  ICD-Medtronic-CRT      On anticoagulation with warfarin without clinical bleeding hemoglobin stable  No symptoms of angina.  Continue carvedilol.  Euvolemic.  We will continue furosemide 20 mg once a week.  With his cardiomyopathy, continue carvedilol 3.125 twice daily and Lanoxin 0.25.  Entresto 24-26 uptitration which has been limited by blood pressure

## 2022-05-19 DIAGNOSIS — Z7984 Long term (current) use of oral hypoglycemic drugs: Secondary | ICD-10-CM | POA: Diagnosis not present

## 2022-05-19 DIAGNOSIS — H52223 Regular astigmatism, bilateral: Secondary | ICD-10-CM | POA: Diagnosis not present

## 2022-05-19 DIAGNOSIS — H25012 Cortical age-related cataract, left eye: Secondary | ICD-10-CM | POA: Diagnosis not present

## 2022-05-19 DIAGNOSIS — H524 Presbyopia: Secondary | ICD-10-CM | POA: Diagnosis not present

## 2022-05-19 DIAGNOSIS — E113293 Type 2 diabetes mellitus with mild nonproliferative diabetic retinopathy without macular edema, bilateral: Secondary | ICD-10-CM | POA: Diagnosis not present

## 2022-05-19 DIAGNOSIS — H2513 Age-related nuclear cataract, bilateral: Secondary | ICD-10-CM | POA: Diagnosis not present

## 2022-05-22 ENCOUNTER — Other Ambulatory Visit: Payer: Self-pay | Admitting: Family Medicine

## 2022-05-22 ENCOUNTER — Other Ambulatory Visit: Payer: Self-pay | Admitting: Internal Medicine

## 2022-05-24 DIAGNOSIS — M545 Low back pain, unspecified: Secondary | ICD-10-CM | POA: Diagnosis not present

## 2022-05-24 DIAGNOSIS — M25552 Pain in left hip: Secondary | ICD-10-CM | POA: Diagnosis not present

## 2022-05-27 DIAGNOSIS — M25552 Pain in left hip: Secondary | ICD-10-CM | POA: Diagnosis not present

## 2022-05-31 ENCOUNTER — Telehealth: Payer: Self-pay | Admitting: Family Medicine

## 2022-05-31 NOTE — Telephone Encounter (Signed)
Perfect thanks

## 2022-06-02 ENCOUNTER — Encounter: Payer: Self-pay | Admitting: Family Medicine

## 2022-06-02 ENCOUNTER — Ambulatory Visit (INDEPENDENT_AMBULATORY_CARE_PROVIDER_SITE_OTHER): Payer: Medicare HMO | Admitting: Family Medicine

## 2022-06-02 VITALS — BP 100/74 | HR 80 | Temp 98.1°F | Ht 71.0 in | Wt 197.2 lb

## 2022-06-02 DIAGNOSIS — I2510 Atherosclerotic heart disease of native coronary artery without angina pectoris: Secondary | ICD-10-CM | POA: Diagnosis not present

## 2022-06-02 DIAGNOSIS — Z0000 Encounter for general adult medical examination without abnormal findings: Secondary | ICD-10-CM | POA: Diagnosis not present

## 2022-06-02 DIAGNOSIS — I482 Chronic atrial fibrillation, unspecified: Secondary | ICD-10-CM

## 2022-06-02 DIAGNOSIS — E119 Type 2 diabetes mellitus without complications: Secondary | ICD-10-CM | POA: Diagnosis not present

## 2022-06-02 DIAGNOSIS — I5022 Chronic systolic (congestive) heart failure: Secondary | ICD-10-CM

## 2022-06-02 DIAGNOSIS — I2782 Chronic pulmonary embolism: Secondary | ICD-10-CM | POA: Diagnosis not present

## 2022-06-02 DIAGNOSIS — I10 Essential (primary) hypertension: Secondary | ICD-10-CM | POA: Diagnosis not present

## 2022-06-02 DIAGNOSIS — E1169 Type 2 diabetes mellitus with other specified complication: Secondary | ICD-10-CM

## 2022-06-02 DIAGNOSIS — J449 Chronic obstructive pulmonary disease, unspecified: Secondary | ICD-10-CM | POA: Diagnosis not present

## 2022-06-02 DIAGNOSIS — E785 Hyperlipidemia, unspecified: Secondary | ICD-10-CM

## 2022-06-02 LAB — CBC WITH DIFFERENTIAL/PLATELET
Basophils Absolute: 0 10*3/uL (ref 0.0–0.1)
Basophils Relative: 0.7 % (ref 0.0–3.0)
Eosinophils Absolute: 0.2 10*3/uL (ref 0.0–0.7)
Eosinophils Relative: 3.5 % (ref 0.0–5.0)
HCT: 48.4 % (ref 39.0–52.0)
Hemoglobin: 16.1 g/dL (ref 13.0–17.0)
Lymphocytes Relative: 19.1 % (ref 12.0–46.0)
Lymphs Abs: 1.2 10*3/uL (ref 0.7–4.0)
MCHC: 33.3 g/dL (ref 30.0–36.0)
MCV: 98.7 fl (ref 78.0–100.0)
Monocytes Absolute: 0.7 10*3/uL (ref 0.1–1.0)
Monocytes Relative: 11.1 % (ref 3.0–12.0)
Neutro Abs: 4.1 10*3/uL (ref 1.4–7.7)
Neutrophils Relative %: 65.6 % (ref 43.0–77.0)
Platelets: 176 10*3/uL (ref 150.0–400.0)
RBC: 4.91 Mil/uL (ref 4.22–5.81)
RDW: 13.9 % (ref 11.5–15.5)
WBC: 6.2 10*3/uL (ref 4.0–10.5)

## 2022-06-02 LAB — POC URINALSYSI DIPSTICK (AUTOMATED)
Bilirubin, UA: NEGATIVE
Blood, UA: NEGATIVE
Glucose, UA: POSITIVE — AB
Ketones, UA: POSITIVE
Leukocytes, UA: NEGATIVE
Nitrite, UA: NEGATIVE
Protein, UA: POSITIVE — AB
Spec Grav, UA: 1.025 (ref 1.010–1.025)
Urobilinogen, UA: 0.2 E.U./dL
pH, UA: 5 (ref 5.0–8.0)

## 2022-06-02 LAB — MICROALBUMIN / CREATININE URINE RATIO
Creatinine,U: 54.8 mg/dL
Microalb Creat Ratio: 4.3 mg/g (ref 0.0–30.0)
Microalb, Ur: 2.4 mg/dL — ABNORMAL HIGH (ref 0.0–1.9)

## 2022-06-02 LAB — COMPREHENSIVE METABOLIC PANEL
ALT: 20 U/L (ref 0–53)
AST: 16 U/L (ref 0–37)
Albumin: 4.3 g/dL (ref 3.5–5.2)
Alkaline Phosphatase: 71 U/L (ref 39–117)
BUN: 22 mg/dL (ref 6–23)
CO2: 29 mEq/L (ref 19–32)
Calcium: 10.2 mg/dL (ref 8.4–10.5)
Chloride: 104 mEq/L (ref 96–112)
Creatinine, Ser: 0.94 mg/dL (ref 0.40–1.50)
GFR: 77.52 mL/min (ref >60.00)
Glucose, Bld: 162 mg/dL — ABNORMAL HIGH (ref 70–99)
Potassium: 4.5 mEq/L (ref 3.5–5.1)
Sodium: 140 mEq/L (ref 135–145)
Total Bilirubin: 0.7 mg/dL (ref 0.2–1.2)
Total Protein: 7.4 g/dL (ref 6.0–8.3)

## 2022-06-02 LAB — HEMOGLOBIN A1C: Hgb A1c MFr Bld: 8.2 % — ABNORMAL HIGH (ref 4.6–6.5)

## 2022-06-02 MED ORDER — BLOOD GLUCOSE MONITOR KIT: PACK | 0 refills | Status: AC

## 2022-06-02 NOTE — Progress Notes (Signed)
Phone: [phone]   Subjective:  Patient presents today for their annual physical. Chief complaint-noted.   See problem oriented charting- ROS- full  review of systems was completed and negative  except for: joint pain, back pain, gait issues due to pain, neck stiffness, frequent urination, palpitations with a fib, eye itching, pnd, runny nose  The following were reviewed and entered/updated in epic: Past Medical History:  Diagnosis Date   CAD (coronary artery disease)    CHF (congestive heart failure) ([place])    Chronic atrial fibrillation ([place])    Colon polyps    Diabetes mellitus    Diverticulosis of colon    DIVERTICULOSIS, COLON 10/16/2006   Qualifier: Diagnosis of  By: Leanne Chang MD, Bruce     Excessive daytime sleepiness 02/19/2016   Hyperlipidemia    Hypertension    MI (mitral incompetence)    Nephrolithiasis    NEPHROLITHIASIS 06/26/2008   Qualifier: Diagnosis of  By: Sherlynn Stalls, CMA, Cindy     OSA (obstructive sleep apnea) 02/19/2016   Moderate to severe OSA with an AHI of 25/hr and on CPAP at 8cm H2O   PE (pulmonary embolism)    V-tach [facility])    Patient Active Problem List   Diagnosis Date Noted   COPD (chronic obstructive pulmonary disease) ([place]) 12/26/2018    Priority: High   History of subdural hematoma 11/21/2017    Priority: High   Chronic atrial fibrillation ([place])     Priority: High   Chronic systolic heart failure ([place]) 08/06/2015    Priority: High   Former smoker 11/15/2014    Priority: High   History of cardioembolic cerebrovascular accident (CVA) 12/19/2012    Priority: High    ventricular tachycardia-non sustained  02/17/2012    Priority: High   Diabetes mellitus type II, controlled ([place]) 10/16/2006    Priority: High   CAD (coronary artery disease) 10/16/2006    Priority: High   Chronic pulmonary embolism ([place]) 10/16/2006    Priority: High   Dyspnea on exertion 08/22/2017    Priority: Medium    OSA (obstructive sleep apnea) 02/19/2016    Priority:  Medium    Carotid artery stenosis 03/17/2015    Priority: Medium    Implantable cardioverter-defibrillator (ICD) in situ 02/17/2012    Priority: Medium    History of skin cancer 07/05/2007    Priority: Medium    Hyperlipidemia associated with type 2 diabetes mellitus ([place]) 10/16/2006    Priority: Medium    Essential hypertension 10/16/2006    Priority: Medium    Osteoarthritis of right knee 09/22/2018    Priority: Low   Frontal headache 10/06/2017    Priority: Low   Rhinitis 08/22/2017    Priority: Low   LBBB (left bundle branch block) 08/06/2015    Priority: Low   Actinic keratosis 11/15/2014    Priority: Low   Benign neoplasm of colon 12/11/2012    Priority: Low   Ischemic cardiomyopathy 03/28/2017   Past Surgical History:  Procedure Laterality Date   BACK SURGERY     BASAL CELL CARCINOMA EXCISION     nose   BIV UPGRADE N/A 03/28/2017   Procedure: BiV Upgrade;  Surgeon: Deboraha Sprang, MD;  Location: [place] [facility];  Service: Cardiovascular;  Laterality: N/A;   CARDIAC CATHETERIZATION N/A 01/20/2016   Procedure: Right/Left Heart Cath and Coronary Angiography;  Surgeon: Jolaine Artist, MD;  Location: [place] [facility];  Service: Cardiovascular;  Laterality: N/A;   CARDIAC DEFIBRILLATOR PLACEMENT     medtronic  virtuoso   CHOLECYSTECTOMY     COLONOSCOPY  12/11/2012   Procedure: COLONOSCOPY;  Surgeon: Inda Castle, MD;  [facility];  Service: Endoscopy;  Laterality: N/A;   KNEE ARTHROPLASTY      Family History  Problem Relation Age of Onset   Bladder Cancer Mother    Heart disease Mother    Colon cancer Father    Diabetes Father    Heart disease Father    Heart attack Child 43   Heart attack Child 8    Medications- reviewed and updated Current Outpatient Medications  Medication Sig Dispense Refill   ACCU-CHEK AVIVA PLUS test strip USE  STRIP TO CHECK GLUCOSE ONCE DAILY 100 each 4   Ascorbic Acid (VITAMIN C ER PO) Take 1 tablet by mouth daily.      blood glucose meter kit and supplies KIT Dispense based on patient and insurance preference. Use up to four times daily as directed. 1 each 0   carvedilol (COREG) 6.25 MG tablet Take 0.5 tablets (3.125 mg total) by mouth 2 (two) times daily with a meal. 60 tablet 4   digoxin (LANOXIN) 0.25 MG tablet Take 1/2 (one-half) tablet by mouth once daily 45 tablet 3   furosemide (LASIX) 20 MG tablet Take 1 tablet by mouth once a week 12 tablet 0   glimepiride (AMARYL) 4 MG tablet TAKE 2 TABLETS BY MOUTH ONCE DAILY WITH BREAKFAST 180 tablet 0   JARDIANCE 10 MG TABS tablet Take 1 tablet by mouth once daily 30 tablet 0   metFORMIN (GLUCOPHAGE-XR) 500 MG 24 hr tablet TAKE 2 TABLETS BY MOUTH IN THE MORNING AND AT BEDTIME 360 tablet 0   rosuvastatin (CRESTOR) 40 MG tablet Take 1 tablet by mouth once daily 90 tablet 0   sacubitril-valsartan (ENTRESTO) 24-26 MG Take 1 tablet by mouth 2 (two) times daily. 60 tablet 11   XARELTO 20 MG TABS tablet Take 1 tablet by mouth once daily 30 tablet 11   No current facility-administered medications for this visit.    Allergies-reviewed and updated Allergies  Allergen Reactions   Penicillins Other (See Comments)    Patient passed out   Sulfamethoxazole Rash    Social History   Social History Narrative   Married. 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 Oropharynx normal Neck: no thyromegaly CV: RRR no murmurs rubs or gallops Lungs: CTAB no crackles, wheeze, rhonchi Abdomen: soft/nontender/nondistended/normal bowel sounds.  No rebound or guarding.  Ext: no edema Skin: warm, dry Neuro: grossly normal, moves all extremities, PERRLA, antalgic gait   Assessment and Plan  81 y.o. adult presenting for annual physical.  Health Maintenance counseling: 1. Anticipatory guidance: Patient counseled regarding regular dental exams -q6 months, eye exams -yearly,  avoiding smoking and second hand smoke , limiting alcohol to 2 beverages per day - doesn't drink, no illegal drugs.   2. Risk factor reduction:  Advised patient of need for regular exercise and diet rich and fruits  and vegetables to reduce risk of heart attack and stroke.  Exercise- some walking and some yardwork.  Diet/weight management- Wt down 5 lbs from last visit- reports some variability Wt Readings from Last 3 Encounters:  06/02/22 197 lb 3.2 oz (89.4 kg)  05/17/22 202 lb (91.6 kg)  03/29/22 202 lb 6.4 oz (91.8 kg)  3. Immunizations/screenings/ancillary studies- plans to wait on updated covid booster  but otherwise up to date Immunization History  Administered Date(s) Administered   DTaP 09/01/2011   Fluad Quad(high Dose 65+) 08/16/2019, 10/16/2020, 09/01/2021   Influenza Split 09/08/2011   Influenza Whole 08/23/2008, 09/10/2009, 09/04/2010   Influenza, High Dose Seasonal PF 09/07/2013, 09/17/2014, 09/10/2016, 08/11/2017, 09/04/2018   Influenza-Unspecified 09/03/2015   PFIZER(Purple Top)SARS-COV-2 Vaccination 12/21/2019, 02/08/2020, 09/30/2020, 03/10/2021   PNEUMOCOCCAL CONJUGATE-20 03/02/2021   Pfizer Covid-19 Vaccine Bivalent Booster 40yr & up 11/02/2021   Pneumococcal Conjugate-13 11/15/2014   Pneumococcal Polysaccharide-23 09/08/2011   Tdap 11/15/2014   Zoster Recombinat (Shingrix) 05/09/2018, 08/08/2018   Zoster, Live 02/06/2008  4. Prostate cancer screening-  past age based screening recommendations. No change in urination pattern- stable nocturia for example Lab Results  Component Value Date   PSA 0.85 10/27/2012   PSA 1.08 12/10/2008    PSA 1.41 02/06/2007   5. Colon cancer screening -  past age based screening recommendations particularly in light of complexity of other medical issues- prior saw DR. Danis 6. Skin cancer screening- [facility]- sees Dr. GPearline Cables advised regular sunscreen use. Denies worrisome, changing, or new skin lesions.  7. Smoking associated screening (lung cancer screening, AAA screen 65-75, UA)- former smoker- in lung cancer screening program- also check ua 8. STD screening - only active with wife  Status of chronic or acute concerns   #Chronic systolic heart failure-follows with Dr. BHaroldine Laws#ICD in place with history of ventricular tachycardia S: Compliant with carvedilol 3.125 mg twice a day, digoxin, Lasix 20 mg-once a week, Entresto 24-26 mg.  Also on Jardiance for diabetes which is favorable for heart failure patients -ICD monitored by cardiology A/P: heart failure apperas stable. Still has ICD in place- continue current meds    # Atrial fibrillation chronic/ history of cardioemolic CVA/history subdural hematoma/chronic PE S: Compliant with carvedilol 3.125  mg twice a day for rate control and Xarelto 20 mg for anticoagulation. -stable shortness of breath -Does have history of subdural hematoma-Xarelto was held short-term but was restarted once resolved- has not had recurrent issues -History of cardioembolic strokes we prefer to have him on Xarelto as long as no falls A/P: all issues appropriately addressed with current meds- continue current meds   #CAD/hyperlipidemia/carotid artery stenosis S: History of MI in 1983.  Denies history of stents or bypass-streptokinase was used in the past per patient. -Due to bleeding risk is on Xarelto alone and not on aspirin -Patient compliant with rosuvastatin 40 mg daily.  LDL goal under 70 -no chest pain, stable shortness of breath Lab Results  Component Value Date   CHOL 118 03/29/2022   HDL 37.10 (L) 03/29/2022   LDLCALC 44 03/29/2022    LDLDIRECT 77 11/06/2020   TRIG 185.0 (H) 03/29/2022   CHOLHDL 3 03/29/2022  A/P: CAD asymptomatic- continue current meds. Lipds very well controlled- continue current meds- too soon for lipid panel.   -Carotid artery stenosis in 2015.  Only 1 to 39% stenosis-stable 2022- repeat 2 years- stable- continue risk factor modification  #COPD Stable without meds- baseline SOB. Not wheezing much   # Diabetes S: Compliant with metformin  1030m BID, jardiance 160m(q2 hour urination so wants to avoid increase ), amaryl 8 mg. Price is big concern CBGs- sugar last week up to 386 two hours after a meal and 384 later- was also as low as 77 a few hours later then 74 another hour later (never felt poorly). In mornings usually 110-150 and was within that range that day. He is not sure if old meter is accurate- cant even get strips to match up with old meter Lab Results  Component Value Date   HGBA1C 7.9 (H) 03/29/2022   HGBA1C 8.6 (H) 11/13/2021   HGBA1C 8.3 (H) 07/10/2021   A/P: with recent sugar elevations wanted to check a1c- knows there is potential charge on this.  - if elevated could consider long acting insulin once a day if affordable- I think around $25 with newly negotiated insurance prices. Other options limited by price. He needs click pen to be able to inject himself   #Hypertension S: Compliant with carvedilol, Entresto, Lasix.  Blood pressure traditionally runs very low but these medications have been required due to CHF  A/P: low normal but BP meds needed- continue them for alternate issues- mainly CHF   #OSA-compliant with CPAP regularly  #b12 deficiency- levels stable on supplement- check annually Lab Results  Component Value Date   VITAMINB12 455 11/20/2021   Recommended follow up: Return in about 4 months (around 10/02/2022) for followup or sooner if needed.Schedule b4 you leave. Future Appointments  Date Time Provider [place]/30/2023 11:00 AM Bensimhon, DaShaune PascalMD  [facility] None  07/27/2022  7:20 AM [facility] DEVICE REMOTES [facility]  10/26/2022  7:20 AM [facility] DEVICE REMOTES [facility]  01/25/2023  7:20 AM [facility] DEVICE REMOTES [facility]  04/26/2023  7:20 AM [facility] DEVICE REMOTES [facility]  07/26/2023  7:20 AM [facility] DEVICE REMOTES [facility]   Lab/Order associations: fasting   ICD-10-CM   1. Preventative health care  Z00.00     2. Controlled type 2 diabetes mellitus without complication, without long-term current use of insulin (HCC)  E11.9 CBC with Differential/Platelet    Comprehensive metabolic panel    Hemoglobin A1c    Microalbumin / creatinine urine ratio    POCT Urinalysis Dipstick (Automated)    CANCELED: Lipid panel    3. Chronic obstructive pulmonary disease, unspecified COPD type ([place] J44.9     4. Chronic systolic heart failure (HCC)  I50.22     5. Other chronic pulmonary embolism without acute cor pulmonale (HCC)  I27.82     6. Coronary artery disease involving native coronary artery of native heart without angina pectoris  I25.10     7. Chronic atrial fibrillation (HCC)  I48.20     8. Hyperlipidemia associated with type 2 diabetes mellitus (HCC)  E11.69    E78.5     9. Essential hypertension  I10       Meds ordered this encounter  Medications   blood glucose meter kit and supplies KIT    Sig: Dispense based on patient and insurance preference. Use up to four times daily as directed.    Dispense:  1 each    Refill:  0    Order Specific Question:   Number of strips    Answer:   100    Order Specific Question:   Number of lancets    Answer:   100    Return precautions advised.  StGarret ReddishMD

## 2022-06-02 NOTE — Patient Instructions (Addendum)
Please stop by lab before you go If you have mychart- we will send your results within 3 business days of [place] receiving them.  If you do not have mychart- we will call you about results within 5 business days of [place] receiving them.  *please also note that you will see labs on mychart as soon as they post. I will later go in and write notes on them- will say "notes from Dr. Yong Channel"   Recommended follow up: Return in about 4 months (around 10/02/2022) for followup or sooner if needed.Schedule b4 you leave.

## 2022-06-04 ENCOUNTER — Ambulatory Visit (HOSPITAL_COMMUNITY)
Admission: RE | Admit: 2022-06-04 | Discharge: 2022-06-04 | Disposition: A | Discharge status: Home / Self Care | Payer: Medicare HMO | Source: Ambulatory Visit | Attending: Internal Medicine | Admitting: Internal Medicine

## 2022-06-04 ENCOUNTER — Encounter (HOSPITAL_COMMUNITY): Payer: Self-pay | Admitting: Internal Medicine

## 2022-06-04 ENCOUNTER — Telehealth (HOSPITAL_COMMUNITY): Payer: Self-pay | Admitting: Pharmacy Technician

## 2022-06-04 VITALS — BP 104/60 | HR 77 | Wt 198.6 lb

## 2022-06-04 DIAGNOSIS — Z880 Allergy status to penicillin: Secondary | ICD-10-CM | POA: Diagnosis not present

## 2022-06-04 DIAGNOSIS — M25552 Pain in left hip: Secondary | ICD-10-CM | POA: Diagnosis not present

## 2022-06-04 DIAGNOSIS — Z882 Allergy status to sulfonamides status: Secondary | ICD-10-CM | POA: Insufficient documentation

## 2022-06-04 DIAGNOSIS — I2510 Atherosclerotic heart disease of native coronary artery without angina pectoris: Secondary | ICD-10-CM | POA: Insufficient documentation

## 2022-06-04 DIAGNOSIS — Z8673 Personal history of transient ischemic attack (TIA), and cerebral infarction without residual deficits: Secondary | ICD-10-CM | POA: Diagnosis not present

## 2022-06-04 DIAGNOSIS — Z800 Family history of malignant neoplasm of digestive organs: Secondary | ICD-10-CM | POA: Insufficient documentation

## 2022-06-04 DIAGNOSIS — I5022 Chronic systolic (congestive) heart failure: Secondary | ICD-10-CM | POA: Insufficient documentation

## 2022-06-04 DIAGNOSIS — Z8249 Family history of ischemic heart disease and other diseases of the circulatory system: Secondary | ICD-10-CM | POA: Insufficient documentation

## 2022-06-04 DIAGNOSIS — Z79899 Other long term (current) drug therapy: Secondary | ICD-10-CM | POA: Diagnosis not present

## 2022-06-04 DIAGNOSIS — I482 Chronic atrial fibrillation, unspecified: Secondary | ICD-10-CM | POA: Diagnosis not present

## 2022-06-04 DIAGNOSIS — Z7901 Long term (current) use of anticoagulants: Secondary | ICD-10-CM | POA: Insufficient documentation

## 2022-06-04 DIAGNOSIS — M5432 Sciatica, left side: Secondary | ICD-10-CM | POA: Diagnosis not present

## 2022-06-04 DIAGNOSIS — Z8719 Personal history of other diseases of the digestive system: Secondary | ICD-10-CM | POA: Insufficient documentation

## 2022-06-04 DIAGNOSIS — E119 Type 2 diabetes mellitus without complications: Secondary | ICD-10-CM | POA: Insufficient documentation

## 2022-06-04 DIAGNOSIS — Z86711 Personal history of pulmonary embolism: Secondary | ICD-10-CM | POA: Diagnosis not present

## 2022-06-04 DIAGNOSIS — Z9049 Acquired absence of other specified parts of digestive tract: Secondary | ICD-10-CM | POA: Insufficient documentation

## 2022-06-04 DIAGNOSIS — Z87891 Personal history of nicotine dependence: Secondary | ICD-10-CM | POA: Insufficient documentation

## 2022-06-04 DIAGNOSIS — I110 Hypertensive heart disease with heart failure: Secondary | ICD-10-CM | POA: Diagnosis not present

## 2022-06-04 DIAGNOSIS — G4733 Obstructive sleep apnea (adult) (pediatric): Secondary | ICD-10-CM | POA: Diagnosis not present

## 2022-06-04 DIAGNOSIS — Z85828 Personal history of other malignant neoplasm of skin: Secondary | ICD-10-CM | POA: Diagnosis not present

## 2022-06-04 DIAGNOSIS — E785 Hyperlipidemia, unspecified: Secondary | ICD-10-CM | POA: Diagnosis not present

## 2022-06-04 DIAGNOSIS — I252 Old myocardial infarction: Secondary | ICD-10-CM | POA: Insufficient documentation

## 2022-06-04 DIAGNOSIS — Z7984 Long term (current) use of oral hypoglycemic drugs: Secondary | ICD-10-CM | POA: Insufficient documentation

## 2022-06-04 DIAGNOSIS — Z95810 Presence of automatic (implantable) cardiac defibrillator: Secondary | ICD-10-CM | POA: Diagnosis not present

## 2022-06-04 DIAGNOSIS — Z833 Family history of diabetes mellitus: Secondary | ICD-10-CM | POA: Diagnosis not present

## 2022-06-04 NOTE — Telephone Encounter (Signed)
Advanced Heart Failure Patient Advocate Encounter  The patient was approved for a [employer] grant that will help cover the cost of Jardiance. Total amount awarded, $10,000. Eligibility, 05/05/22 - 05/05/23.  ID [id number]  BIN [id number]  PCN [id number]  Group [id number]  Patient was given a copy of the grant information to take to the pharmacy.  Charlann Boxer, CPhT

## 2022-06-04 NOTE — Patient Instructions (Signed)
Great to see you today!!!!  Continue current medications  Your physician recommends that you schedule a follow-up appointment in: 6 months (December), **PLEASE CALL OUR OFFICE IN SEPTEMBER TO SCHEDULE THIS APPOINTMENT  If you have any questions or concerns before your next appointment please send [place] a message through [place] or call our office at [phone].    TO LEAVE A MESSAGE FOR THE NURSE SELECT OPTION 2, PLEASE LEAVE A MESSAGE INCLUDING: YOUR NAME DATE OF BIRTH CALL BACK NUMBER REASON FOR CALL**this is important as we prioritize the call backs  YOU WILL RECEIVE A CALL BACK THE SAME DAY AS LONG AS YOU CALL BEFORE 4:00 PM  At the [facility], you and your health needs are our priority. As part of our continuing mission to provide you with exceptional heart care, we have created designated Provider Care Teams. These Care Teams include your primary Cardiologist (physician) and Advanced Practice Providers (APPs- Physician Assistants and Nurse Practitioners) who all work together to provide you with the care you need, when you need it.   You may see any of the following providers on your designated Care Team at your next follow up: Dr Glori Bickers Dr Haynes Kerns, NP Lyda Jester, [facility] [place], [place] Audry Riles, PharmD   Please be sure to bring in all your medications bottles to every appointment.

## 2022-06-04 NOTE — Progress Notes (Signed)
Medication Samples have been provided to the patient.  Drug name: Jardiance       Strength: 10 mg        Qty:4  [id number]  Exp.Date: 12/2023   Dosing instructions: Take 1 tablet daily  The patient has been instructed regarding the correct time, dose, and frequency of taking this medication, including desired effects and most common side effects.   [name] [name] 12:27 PM 06/04/2022

## 2022-06-04 NOTE — Progress Notes (Signed)
Medication Samples have been provided to the patient.  Drug name: Xarelto       Strength: 20 mg        Qty: 4  LOT: [id number]  Exp.Date: 09/2023  Dosing instructions: Take 1 tablet daily  The patient has been instructed regarding the correct time, dose, and frequency of taking this medication, including desired effects and most common side effects.   [name] Curren Mohrmann 12:23 PM 06/04/2022

## 2022-06-04 NOTE — Progress Notes (Signed)
Advanced Heart Failure Clinic Note    Date:  06/04/2022   ID:  Carl Wood, DOB 10/21/1941, MRN [id number]  Location: Home  Provider location: [address], [place] Type of Visit: Established patient PCP:  Marin Olp, MD  Cardiologist:Dr Turner  Primary HF: Dr Haroldine Laws  EP: Dr Caryl Comes   History of Present Illness:  Mr Minehart is a 81 y/o male with CAD s/p MI 1983, ICM with chronic systolic heart failure s/p Medtronic single chamber ICD (EF 20%) CVA 2014, DM2 VT, permanent A fib on Xarelto, SDH 2019, OSA, and former smoker.   Had CRT-D upgrade in April 2018.    In 12/18 saw Dr. Tomi Likens for chronic HA. Found to have small SDHs that were felt to be chronic after a previous fall when trimming vines. Xarelto stopped. F/u C 2/19 SDHs resolved. Xarelto restarted in 2/19  Echo 1/21   EF 20-25%  Echo 01/13/22 EF 25-30% RV ok   ABIs 2017 No PAD  Here for f/u with his wife. Doing ok. Main issues is left hip pain and sciatica. Denies CP. Can walk in stores with cart but otherwise struggles with back pain. No orthopnea or PND.   ICD interrogation: No VT. Fluid ok Activity 2hr/day. 100% biv pacing Personally reviewed     Past Medical History:  Diagnosis Date   CAD (coronary artery disease)    CHF (congestive heart failure) (HCC)    Chronic atrial fibrillation (HCC)    Colon polyps    Diabetes mellitus    Diverticulosis of colon    DIVERTICULOSIS, COLON 10/16/2006   Qualifier: Diagnosis of  By: Leanne Chang MD, Bruce     Excessive daytime sleepiness 02/19/2016   Hyperlipidemia    Hypertension    MI (mitral incompetence)    Nephrolithiasis    NEPHROLITHIASIS 06/26/2008   Qualifier: Diagnosis of  By: Sherlynn Stalls, CMA, Cindy     OSA (obstructive sleep apnea) 02/19/2016   Moderate to severe OSA with an AHI of 25/hr and on CPAP at 8cm H2O   PE (pulmonary embolism)    V-tach [facility])    Past Surgical History:  Procedure Laterality Date   BACK SURGERY     BASAL CELL  CARCINOMA EXCISION     nose   BIV UPGRADE N/A 03/28/2017   Procedure: BiV Upgrade;  Surgeon: Deboraha Sprang, MD;  Location: [place] [facility];  Service: Cardiovascular;  Laterality: N/A;   CARDIAC CATHETERIZATION N/A 01/20/2016   Procedure: Right/Left Heart Cath and Coronary Angiography;  Surgeon: Jolaine Artist, MD;  Location: [place] [facility];  Service: Cardiovascular;  Laterality: N/A;   CARDIAC DEFIBRILLATOR PLACEMENT     medtronic virtuoso   CHOLECYSTECTOMY     COLONOSCOPY  12/11/2012   Procedure: COLONOSCOPY;  Surgeon: Inda Castle, MD;  [facility];  Service: Endoscopy;  Laterality: N/A;   KNEE ARTHROPLASTY       Current Outpatient Medications  Medication Sig Dispense Refill   ACCU-CHEK AVIVA PLUS test strip USE  STRIP TO CHECK GLUCOSE ONCE DAILY 100 each 4   Ascorbic Acid (VITAMIN C ER PO) Take 1 tablet by mouth daily.     blood glucose meter kit and supplies KIT Dispense based on patient and insurance preference. Use up to four times daily as directed. 1 each 0   carvedilol (COREG) 6.25 MG tablet Take 0.5 tablets (3.125 mg total) by mouth 2 (two) times daily with a meal. 60 tablet 4  digoxin (LANOXIN) 0.25 MG tablet Take 1/2 (one-half) tablet by mouth once daily 45 tablet 3   furosemide (LASIX) 20 MG tablet Take 1 tablet by mouth once a week 12 tablet 0   glimepiride (AMARYL) 4 MG tablet TAKE 2 TABLETS BY MOUTH ONCE DAILY WITH BREAKFAST 180 tablet 0   JARDIANCE 10 MG TABS tablet Take 1 tablet by mouth once daily 30 tablet 0   metFORMIN (GLUCOPHAGE-XR) 500 MG 24 hr tablet TAKE 2 TABLETS BY MOUTH IN THE MORNING AND AT BEDTIME 360 tablet 0   rosuvastatin (CRESTOR) 40 MG tablet Take 1 tablet by mouth once daily 90 tablet 0   sacubitril-valsartan (ENTRESTO) 24-26 MG Take 1 tablet by mouth 2 (two) times daily. 60 tablet 11   XARELTO 20 MG TABS tablet Take 1 tablet by mouth once daily 30 tablet 11   No current facility-administered medications for this encounter.     Allergies:   Penicillins and Sulfamethoxazole   Social History:  The patient  reports that he quit smoking about 9 years ago. His smoking use included cigarettes. He has a 50.00 pack-year smoking history. He has never used smokeless tobacco. He reports that he does not drink alcohol and does not use drugs.   Family History:  The patient's family history includes Bladder Cancer in his mother; Colon cancer in his father; Diabetes in his father; Heart attack (age of onset: 11) in his child; Heart attack (age of onset: 53) in his child; Heart disease in his father and mother.   ROS:  Please see the history of present illness.   All other systems are personally reviewed and negative.   Vitals:   06/04/22 1134  BP: 104/60  Pulse: 77  SpO2: 97%    Exam:  General:  Well appearing. No resp difficulty HEENT: normal Neck: supple. no JVD. Carotids 2+ bilat; no bruits. No lymphadenopathy or thryomegaly appreciated. Cor: PMI nondisplaced. irregular rate & rhythm. No rubs, gallops or murmurs. Lungs: clear Abdomen: soft, nontender, nondistended. No hepatosplenomegaly. No bruits or masses. Good bowel sounds. Extremities: no cyanosis, clubbing, rash, edema Neuro: alert & orientedx3, cranial nerves grossly intact. moves all 4 extremities w/o difficulty. Affect pleasant   Recent Labs: 09/11/2021: TSH 1.919 01/13/2022: B Natriuretic Peptide 79.4 06/02/2022: ALT 20; BUN 22; Creatinine, Ser 0.94; Hemoglobin 16.1; Platelets 176.0; Potassium 4.5; Sodium 140   Wt Readings from Last 3 Encounters:  06/04/22 90.1 kg (198 lb 9.6 oz)  06/02/22 89.4 kg (197 lb 3.2 oz)  05/17/22 91.6 kg (202 lb)    ASSESSMENT AND PLAN:  1. Chronic Systolic HF: ICM, s/p Medtronic Bi-V ICD (upgrade 4/18). Echo 09/2016 EF 25-30%. ECHO 8/19 EF 20% - Echo 12/12/19 EF 20-25% RV ok  - Stable NYHA III - Volume status ok on exam and ICD interrogation. ICD interrogated personally in clinic.  - Echo  01/13/22 EF stable 25-30% RV ok -  Continue carvedilol 3.125 bid (reduced previously due to low BP) - Continue digoxin.  - Continue Entresto to 24/26 mg BID.  - Continue Jardiance. - No spiro with history of hyperkalemia and soft BP.    2. OSA - Continue CPAP. Follows with Dr. Radford Pax - Follows with Dr. Radford Pax. Downloads have looked good    3. Chronic A fib - Rate controlled - Xarelto restarted after resolution of SDH. No evidence of current bleeding   4. CAD: - Has known distal LAD lesion 95% and mid LAD 40%. No benefit to revascularization given wall motion abnormality.  -  No s/s angina - Continue statin. Off ASA with Xarelto   Signed, Glori Bickers, MD  12:06 PM   [facility] [address] [facility] and [place] [address] [phone] (office) [phone] (fax)

## 2022-06-13 ENCOUNTER — Other Ambulatory Visit: Payer: Self-pay | Admitting: Family Medicine

## 2022-06-17 ENCOUNTER — Other Ambulatory Visit: Payer: Self-pay | Admitting: Family Medicine

## 2022-06-19 ENCOUNTER — Other Ambulatory Visit: Payer: Self-pay | Admitting: Family Medicine

## 2022-06-21 ENCOUNTER — Other Ambulatory Visit: Payer: Self-pay | Admitting: Internal Medicine

## 2022-06-28 DIAGNOSIS — H524 Presbyopia: Secondary | ICD-10-CM | POA: Diagnosis not present

## 2022-07-05 ENCOUNTER — Other Ambulatory Visit: Payer: Self-pay | Admitting: Family Medicine

## 2022-07-05 ENCOUNTER — Telehealth: Payer: Self-pay | Admitting: Family Medicine

## 2022-07-05 MED ORDER — ACCU-CHEK GUIDE VI STRP: ORAL_STRIP | 0 refills | Status: DC

## 2022-07-05 NOTE — Telephone Encounter (Signed)
#  450 sent in for refill.

## 2022-07-05 NOTE — Telephone Encounter (Signed)
Patient states the RX for ACCU-CHEK GUIDE test strip  only lasts 10-12 days. Patient states he uses 4-5 test strips daily. Patient requests a new RX for the above test strips for approx. 150 test strips/3 boxes of 50 test strips with refills be sent to:  [place], [place] Phone:  [phone]  Fax:  [phone]

## 2022-07-10 ENCOUNTER — Other Ambulatory Visit: Payer: Self-pay | Admitting: Family Medicine

## 2022-07-15 ENCOUNTER — Other Ambulatory Visit: Payer: Self-pay

## 2022-07-15 NOTE — Patient Outreach (Signed)
  Care Coordination   Initial Visit Note   07/15/2022 Name: Carl Wood MRN: [id number] DOB: [birth year]  Carl Wood is a 81 y.o. year old adult who sees Yong Channel, Brayton Mars, MD for primary care. I spoke with  Carl Wood by phone today  What matters to the patients health and wellness today?  Declines to complete call feels he is doing OK    Goals Addressed   None     SDOH assessments and interventions completed:  No     Care Coordination Interventions Activated:  No  Care Coordination Interventions:  No, not indicated   Follow up plan: No further intervention required.   Encounter Outcome:  Pt. Refused  Peter Garter RN, Jackquline Denmark, [facility] [phone]

## 2022-07-27 ENCOUNTER — Ambulatory Visit (INDEPENDENT_AMBULATORY_CARE_PROVIDER_SITE_OTHER): Payer: Medicare HMO

## 2022-07-27 DIAGNOSIS — I5022 Chronic systolic (congestive) heart failure: Secondary | ICD-10-CM | POA: Diagnosis not present

## 2022-07-27 LAB — CUP PACEART REMOTE DEVICE CHECK
Battery Remaining Longevity: 15 mo
Battery Voltage: 2.88 V
Brady Statistic AP VP Percent: 0 %
Brady Statistic AP VS Percent: 0 %
Brady Statistic AS VP Percent: 0 %
Brady Statistic AS VS Percent: 0 %
Brady Statistic RA Percent Paced: 0 %
Brady Statistic RV Percent Paced: 98 %
Date Time Interrogation Session: 20230822033422
HighPow Impedance: 81 Ohm
Implantable Lead Implant Date: 20010723
Implantable Lead Implant Date: 20180423
Implantable Lead Location: 753858
Implantable Lead Location: 753860
Implantable Lead Model: 6943
Implantable Pulse Generator Implant Date: 20180423
Lead Channel Impedance Value: 1007 Ohm
Lead Channel Impedance Value: 261.164
Lead Channel Impedance Value: 261.164
Lead Channel Impedance Value: 266 Ohm
Lead Channel Impedance Value: 274.19 Ohm
Lead Channel Impedance Value: 279.525
Lead Channel Impedance Value: 285 Ohm
Lead Channel Impedance Value: 399 Ohm
Lead Channel Impedance Value: 4047 Ohm
Lead Channel Impedance Value: 513 Ohm
Lead Channel Impedance Value: 532 Ohm
Lead Channel Impedance Value: 532 Ohm
Lead Channel Impedance Value: 589 Ohm
Lead Channel Impedance Value: 931 Ohm
Lead Channel Impedance Value: 950 Ohm
Lead Channel Impedance Value: 950 Ohm
Lead Channel Impedance Value: 950 Ohm
Lead Channel Impedance Value: 988 Ohm
Lead Channel Pacing Threshold Amplitude: 1.375 V
Lead Channel Pacing Threshold Amplitude: 1.875 V
Lead Channel Pacing Threshold Pulse Width: 0.4 ms
Lead Channel Pacing Threshold Pulse Width: 1 ms
Lead Channel Sensing Intrinsic Amplitude: 11.5 mV
Lead Channel Sensing Intrinsic Amplitude: 11.5 mV
Lead Channel Setting Pacing Amplitude: 2.5 V
Lead Channel Setting Pacing Amplitude: 3 V
Lead Channel Setting Pacing Pulse Width: 0.4 ms
Lead Channel Setting Pacing Pulse Width: 1 ms
Lead Channel Setting Sensing Sensitivity: 0.3 mV

## 2022-08-16 ENCOUNTER — Other Ambulatory Visit: Payer: Self-pay | Admitting: Family Medicine

## 2022-08-24 NOTE — Progress Notes (Signed)
Remote ICD transmission.   

## 2022-08-30 ENCOUNTER — Encounter: Payer: Self-pay | Admitting: *Deleted

## 2022-09-02 ENCOUNTER — Encounter: Payer: Medicare HMO | Admitting: Family Medicine

## 2022-09-06 ENCOUNTER — Other Ambulatory Visit: Payer: Self-pay | Admitting: Family Medicine

## 2022-09-10 ENCOUNTER — Ambulatory Visit (INDEPENDENT_AMBULATORY_CARE_PROVIDER_SITE_OTHER): Payer: Medicare HMO | Admitting: *Deleted

## 2022-09-10 DIAGNOSIS — Z23 Encounter for immunization: Secondary | ICD-10-CM

## 2022-09-13 ENCOUNTER — Telehealth: Payer: Self-pay | Admitting: Family Medicine

## 2022-09-13 ENCOUNTER — Other Ambulatory Visit: Payer: Self-pay | Admitting: Family Medicine

## 2022-09-13 NOTE — Telephone Encounter (Signed)
Spouse WCB to sched AWV

## 2022-09-16 DIAGNOSIS — L538 Other specified erythematous conditions: Secondary | ICD-10-CM | POA: Diagnosis not present

## 2022-09-16 DIAGNOSIS — L821 Other seborrheic keratosis: Secondary | ICD-10-CM | POA: Diagnosis not present

## 2022-09-16 DIAGNOSIS — Z08 Encounter for follow-up examination after completed treatment for malignant neoplasm: Secondary | ICD-10-CM | POA: Diagnosis not present

## 2022-09-16 DIAGNOSIS — L814 Other melanin hyperpigmentation: Secondary | ICD-10-CM | POA: Diagnosis not present

## 2022-09-16 DIAGNOSIS — L820 Inflamed seborrheic keratosis: Secondary | ICD-10-CM | POA: Diagnosis not present

## 2022-09-16 DIAGNOSIS — L570 Actinic keratosis: Secondary | ICD-10-CM | POA: Diagnosis not present

## 2022-09-16 DIAGNOSIS — D225 Melanocytic nevi of trunk: Secondary | ICD-10-CM | POA: Diagnosis not present

## 2022-09-16 DIAGNOSIS — Z85828 Personal history of other malignant neoplasm of skin: Secondary | ICD-10-CM | POA: Diagnosis not present

## 2022-09-16 DIAGNOSIS — L298 Other pruritus: Secondary | ICD-10-CM | POA: Diagnosis not present

## 2022-09-23 ENCOUNTER — Encounter (HOSPITAL_COMMUNITY): Payer: Self-pay

## 2022-09-23 ENCOUNTER — Telehealth (HOSPITAL_COMMUNITY): Payer: Self-pay | Admitting: Pharmacy Technician

## 2022-09-23 NOTE — Telephone Encounter (Signed)
Advanced Heart Failure Patient Advocate Encounter  Patient left message for Xarelto samples. Sent request to [employer], corporate).  Charlann Boxer, CPhT

## 2022-09-23 NOTE — Progress Notes (Unsigned)
Medication Samples have been provided to the patient.  Drug name: xarelto       Strength: 20 mg        Qty: 2  LOT: [id number]  Exp.Date: 02/2024  Dosing instructions: Take 1 tablet daily  The patient has been instructed regarding the correct time, dose, and frequency of taking this medication, including desired effects and most common side effects.   [name] Debria Broecker 4:29 PM 09/23/2022

## 2022-10-01 ENCOUNTER — Encounter: Payer: Self-pay | Admitting: Family Medicine

## 2022-10-01 ENCOUNTER — Ambulatory Visit (INDEPENDENT_AMBULATORY_CARE_PROVIDER_SITE_OTHER): Payer: Medicare HMO | Admitting: Family Medicine

## 2022-10-01 VITALS — BP 100/62 | HR 81 | Temp 98.0°F | Ht 71.0 in | Wt 187.6 lb

## 2022-10-01 DIAGNOSIS — E1169 Type 2 diabetes mellitus with other specified complication: Secondary | ICD-10-CM | POA: Diagnosis not present

## 2022-10-01 DIAGNOSIS — E785 Hyperlipidemia, unspecified: Secondary | ICD-10-CM | POA: Diagnosis not present

## 2022-10-01 DIAGNOSIS — I482 Chronic atrial fibrillation, unspecified: Secondary | ICD-10-CM | POA: Diagnosis not present

## 2022-10-01 DIAGNOSIS — I2510 Atherosclerotic heart disease of native coronary artery without angina pectoris: Secondary | ICD-10-CM

## 2022-10-01 DIAGNOSIS — I5022 Chronic systolic (congestive) heart failure: Secondary | ICD-10-CM

## 2022-10-01 DIAGNOSIS — I10 Essential (primary) hypertension: Secondary | ICD-10-CM

## 2022-10-01 DIAGNOSIS — E119 Type 2 diabetes mellitus without complications: Secondary | ICD-10-CM | POA: Diagnosis not present

## 2022-10-01 LAB — COMPREHENSIVE METABOLIC PANEL
ALT: 18 U/L (ref 0–53)
AST: 19 U/L (ref 0–37)
Albumin: 4.4 g/dL (ref 3.5–5.2)
Alkaline Phosphatase: 58 U/L (ref 39–117)
BUN: 22 mg/dL (ref 6–23)
CO2: 30 mEq/L (ref 19–32)
Calcium: 10.1 mg/dL (ref 8.4–10.5)
Chloride: 103 mEq/L (ref 96–112)
Creatinine, Ser: 0.84 mg/dL (ref 0.40–1.50)
GFR: 83.19 mL/min (ref >60.00)
Glucose, Bld: 134 mg/dL — ABNORMAL HIGH (ref 70–99)
Potassium: 4.9 mEq/L (ref 3.5–5.1)
Sodium: 140 mEq/L (ref 135–145)
Total Bilirubin: 0.7 mg/dL (ref 0.2–1.2)
Total Protein: 7.4 g/dL (ref 6.0–8.3)

## 2022-10-01 LAB — HEMOGLOBIN A1C: Hgb A1c MFr Bld: 6.3 % (ref 4.6–6.5)

## 2022-10-01 NOTE — Patient Instructions (Addendum)
Sign release of information at the check out desk for Diabetic Eye Exam.  You are eligible to schedule your annual wellness visit with our nurse specialist Otila Kluver.  Please consider scheduling this before you leave today  Please stop by lab before you go If you have mychart- we will send your results within 3 business days of [place] receiving them.  If you do not have mychart- we will call you about results within 5 business days of [place] receiving them.  *please also note that you will see labs on mychart as soon as they post. I will later go in and write notes on them- will say "notes from Dr. Yong Channel"   Recommended follow up: Return in about 4 months (around 02/01/2023) for followup or sooner if needed.Schedule b4 you leave.

## 2022-10-01 NOTE — Progress Notes (Signed)
Phone [phone] In person visit   Subjective:   Carl Wood is a 81 y.o. year old very pleasant adult patient who presents for/with See problem oriented charting Chief Complaint  Patient presents with   Follow-up   Hypertension   Diabetes    Pt states he can eat cereal and check sugars 2 hours later and it can be 170 and the next day after eating it can be 140. Fasting early his readings are around 87.   Past Medical History-  Patient Active Problem List   Diagnosis Date Noted   COPD (chronic obstructive pulmonary disease) ([place]) 12/26/2018    Priority: High   History of subdural hematoma 11/21/2017    Priority: High   Chronic atrial fibrillation ([place])     Priority: High   Chronic systolic heart failure ([place]) 08/06/2015    Priority: High   Former smoker 11/15/2014    Priority: High   History of cardioembolic cerebrovascular accident (CVA) 12/19/2012    Priority: High    ventricular tachycardia-non sustained  02/17/2012    Priority: High   Diabetes mellitus type II, controlled ([place]) 10/16/2006    Priority: High   CAD (coronary artery disease) 10/16/2006    Priority: High   Chronic pulmonary embolism ([place]) 10/16/2006    Priority: High   Dyspnea on exertion 08/22/2017    Priority: Medium    OSA (obstructive sleep apnea) 02/19/2016    Priority: Medium    Carotid artery stenosis 03/17/2015    Priority: Medium    Implantable cardioverter-defibrillator (ICD) in situ 02/17/2012    Priority: Medium    History of skin cancer 07/05/2007    Priority: Medium    Hyperlipidemia associated with type 2 diabetes mellitus ([place]) 10/16/2006    Priority: Medium    Essential hypertension 10/16/2006    Priority: Medium    Osteoarthritis of right knee 09/22/2018    Priority: Low   Frontal headache 10/06/2017    Priority: Low   Rhinitis 08/22/2017    Priority: Low   LBBB (left bundle branch block) 08/06/2015    Priority: Low   Actinic keratosis 11/15/2014    Priority: Low    Benign neoplasm of colon 12/11/2012    Priority: Low   Ischemic cardiomyopathy 03/28/2017    Medications- reviewed and updated Current Outpatient Medications  Medication Sig Dispense Refill   Ascorbic Acid (VITAMIN C ER PO) Take 1 tablet by mouth daily.     blood glucose meter kit and supplies KIT Dispense based on patient and insurance preference. Use up to four times daily as directed. 1 each 0   carvedilol (COREG) 6.25 MG tablet Take 0.5 tablets (3.125 mg total) by mouth 2 (two) times daily with a meal. 60 tablet 4   digoxin (LANOXIN) 0.25 MG tablet Take 1/2 (one-half) tablet by mouth once daily 45 tablet 3   furosemide (LASIX) 20 MG tablet Take 1 tablet by mouth once a week 12 tablet 0   glimepiride (AMARYL) 4 MG tablet TAKE 2 TABLETS BY MOUTH ONCE DAILY WITH BREAKFAST 180 tablet 0   glucose blood (ACCU-CHEK GUIDE) test strip USE UP TO FOUR TIMES DAILY Dx: E11.9 450 each 0   JARDIANCE 10 MG TABS tablet Take 1 tablet by mouth once daily 30 tablet 0   metFORMIN (GLUCOPHAGE-XR) 500 MG 24 hr tablet TAKE 2 TABLETS BY MOUTH IN THE MORNING AND 2 AT BEDTIME 360 tablet 0   rosuvastatin (CRESTOR) 40 MG tablet Take 1 tablet by mouth once daily 90  tablet 0   sacubitril-valsartan (ENTRESTO) 24-26 MG Take 1 tablet by mouth 2 (two) times daily. 60 tablet 11   XARELTO 20 MG TABS tablet Take 1 tablet by mouth once daily 30 tablet 11   No current facility-administered medications for this visit.     Objective:  BP 100/62   Pulse 81   Temp 98 F (36.7 C)   Ht _0  (1.803 m)   Wt 187 lb 9.6 oz (85.1 kg)   SpO2 95%   BMI 26.16 kg/m  Gen: NAD, resting comfortably CV: RRR no murmurs rubs or gallops Lungs: CTAB no crackles, wheeze, rhonchi Ext: no edema Skin: warm, dry  Diabetic Foot Exam - Simple   Simple Foot Form Diabetic Foot exam was performed with the following findings: Yes 10/01/2022 11:30 AM  Visual Inspection No deformities, no ulcerations, no other skin breakdown bilaterally:  Yes Sensation Testing Intact to touch and monofilament testing bilaterally: Yes Pulse Check Posterior Tibialis and Dorsalis pulse intact bilaterally: Yes Comments Slight thickening left 2nd toenail- likely onychomycosis.  DP pulses are strong bilaterally-PT pulses slightly weaker perhaps 1+ but denies claudication         Assessment and Plan   #Chronic systolic heart failure-follows with Dr. Haroldine Laws #ICD in place with history of ventricular tachycardia S: Compliant with carvedilol 3.125 mg twice a day, digoxin, Lasix 20 mg-once a week, Entresto 24-26 mg.  Also on Jardiance for diabetes which is favorable for heart failure patients -ICD monitored by cardiology - no swelling , weight down 11 lbs with dietary changes! For diabetes A/P: CHF appears stable/euvolemic-continue current medications  # Atrial fibrillation chronic/ history of cardioemolic CVA/history subdural hematoma S: Compliant with carvedilol 3.125  mg twice a day for rate control and Xarelto 20 mg for anticoagulation. -Does have history of subdural hematoma-Xarelto was held short-term but was restarted once resolved-thankfully has done well with this A/P: Appropriately anticoagulated and rate controlled-continue current medication  #CAD/hyperlipidemia/carotid artery stenosis S: History of MI in 1983.  Denies history of stents or bypass-streptokinase was used in the past per patient. -Due to bleeding risk is on Xarelto alone and not on aspirin -Patient compliant with rosuvastatin 40 mg daily.   -No chest pain reported, stable shortness of breath but known COPD  Lab Results  Component Value Date   CHOL 118 03/29/2022   HDL 37.10 (L) 03/29/2022   LDLCALC 44 03/29/2022   LDLDIRECT 77 11/06/2020   TRIG 185.0 (H) 03/29/2022   CHOLHDL 3 03/29/2022  A/P: CAD largely asymptomatic- continue current meds. Lipids with LDL under 70 well controlled- continue current meds  -Carotid artery stenosis in 2015.  Only 1 to 39%  stenosis-stable 2022- repeat 2 years  # Diabetes S: Compliant with metformin 1040m BID XR, jardiance 152m(q2 hour urination so wants to avoid increase ), amaryl 8 mg CBGs- morning sugars typically of 87 if wakes up early- if wakes up later can be 120s-variability after meals but mostly under 180 2 hours after meals- varies from 140-170 on experiment with low carb cereal Exercise and diet- trying to do some light walking- hard with back and neck issues  Lab Results  Component Value Date   HGBA1C 8.2 (H) 06/02/2022   HGBA1C 7.9 (H) 03/29/2022   HGBA1C 8.6 (H) 11/13/2021  A/P: diabetes sounds to have improved- update a1c and CMP today  #Hypertension S: Compliant with carvedilol, Entresto, Lasix.  Blood pressure traditionally runs very low but these medications have been required due to CHF  A/P: low normal but meds needed for CHF- continue current meds   Recommended follow up: Return in about 4 months (around 02/01/2023) for followup or sooner if needed.Schedule b4 you leave. Future Appointments  Date Time Provider [place]  10/26/2022  7:20 AM [facility] DEVICE REMOTES [facility]  12/01/2022 10:40 AM Bensimhon, Shaune Pascal, MD [facility] None  01/25/2023  7:20 AM [facility] DEVICE REMOTES [facility]  04/26/2023  7:20 AM [facility] DEVICE REMOTES [facility]  07/26/2023  7:20 AM [facility] DEVICE REMOTES [facility]   Lab/Order associations:   ICD-10-CM   1. Chronic systolic heart failure (HCC)  I50.22     2. Controlled type 2 diabetes mellitus without complication, without long-term current use of insulin (HCC)  E11.9 Comprehensive metabolic panel    Hemoglobin A1c    3. Chronic atrial fibrillation (HCC)  I48.20     4. Coronary artery disease involving native coronary artery of native heart without angina pectoris  I25.10     5. Essential hypertension  I10     6. Hyperlipidemia associated with type 2 diabetes mellitus  ([place])  E11.69    E78.5       No orders of the defined types were placed in this encounter.   Return precautions advised.  [name], MD

## 2022-10-05 ENCOUNTER — Other Ambulatory Visit: Payer: Self-pay | Admitting: Family Medicine

## 2022-10-11 ENCOUNTER — Other Ambulatory Visit: Payer: Self-pay | Admitting: Family Medicine

## 2022-10-26 ENCOUNTER — Ambulatory Visit (INDEPENDENT_AMBULATORY_CARE_PROVIDER_SITE_OTHER): Payer: Medicare HMO

## 2022-10-26 DIAGNOSIS — I255 Ischemic cardiomyopathy: Secondary | ICD-10-CM

## 2022-10-26 LAB — CUP PACEART REMOTE DEVICE CHECK
Battery Remaining Longevity: 12 mo
Battery Voltage: 2.87 V
Brady Statistic AP VP Percent: 0 %
Brady Statistic AP VS Percent: 0 %
Brady Statistic AS VP Percent: 0 %
Brady Statistic AS VS Percent: 0 %
Brady Statistic RA Percent Paced: 0 %
Brady Statistic RV Percent Paced: 97.5 %
Date Time Interrogation Session: 20231121043723
HighPow Impedance: 75 Ohm
Implantable Lead Connection Status: 753985
Implantable Lead Connection Status: 753985
Implantable Lead Implant Date: 20010723
Implantable Lead Implant Date: 20180423
Implantable Lead Location: 753858
Implantable Lead Location: 753860
Implantable Lead Model: 6943
Implantable Pulse Generator Implant Date: 20180423
Lead Channel Impedance Value: 256.5 Ohm
Lead Channel Impedance Value: 261.164
Lead Channel Impedance Value: 261.164
Lead Channel Impedance Value: 265.661
Lead Channel Impedance Value: 265.661
Lead Channel Impedance Value: 285 Ohm
Lead Channel Impedance Value: 4047 Ohm
Lead Channel Impedance Value: 418 Ohm
Lead Channel Impedance Value: 513 Ohm
Lead Channel Impedance Value: 513 Ohm
Lead Channel Impedance Value: 532 Ohm
Lead Channel Impedance Value: 551 Ohm
Lead Channel Impedance Value: 931 Ohm
Lead Channel Impedance Value: 931 Ohm
Lead Channel Impedance Value: 950 Ohm
Lead Channel Impedance Value: 950 Ohm
Lead Channel Impedance Value: 950 Ohm
Lead Channel Impedance Value: 988 Ohm
Lead Channel Pacing Threshold Amplitude: 1.25 V
Lead Channel Pacing Threshold Amplitude: 1.625 V
Lead Channel Pacing Threshold Pulse Width: 0.4 ms
Lead Channel Pacing Threshold Pulse Width: 1 ms
Lead Channel Sensing Intrinsic Amplitude: 10.875 mV
Lead Channel Sensing Intrinsic Amplitude: 10.875 mV
Lead Channel Setting Pacing Amplitude: 2.25 V
Lead Channel Setting Pacing Amplitude: 3 V
Lead Channel Setting Pacing Pulse Width: 0.4 ms
Lead Channel Setting Pacing Pulse Width: 1 ms
Lead Channel Setting Sensing Sensitivity: 0.3 mV
Zone Setting Status: 755011

## 2022-11-06 ENCOUNTER — Other Ambulatory Visit (HOSPITAL_COMMUNITY): Payer: Self-pay | Admitting: Internal Medicine

## 2022-11-06 ENCOUNTER — Other Ambulatory Visit: Payer: Self-pay | Admitting: Family Medicine

## 2022-11-08 ENCOUNTER — Telehealth (HOSPITAL_COMMUNITY): Payer: Self-pay | Admitting: *Deleted

## 2022-11-08 NOTE — Telephone Encounter (Signed)
Pt called back is aware, he will pick up

## 2022-11-08 NOTE — Telephone Encounter (Signed)
Left vm for pt that xarelto samples are at the front desk for pick up

## 2022-11-09 ENCOUNTER — Telehealth (HOSPITAL_COMMUNITY): Payer: Self-pay | Admitting: Pharmacy Technician

## 2022-11-09 NOTE — Telephone Encounter (Signed)
Advanced Heart Failure Patient Advocate Encounter  Patient brought Entresto renewal application in office. Current [employer] grant will cover Entresto in addition to the [place] it already covers. Will forego reapplying for assistance at this time.   Called and left the patient a message detailing this. Advised him to call back with issues.  Charlann Boxer, CPhT

## 2022-11-11 ENCOUNTER — Other Ambulatory Visit (HOSPITAL_COMMUNITY): Payer: Self-pay

## 2022-11-11 MED ORDER — ENTRESTO 24-26 MG PO TABS: 1.0000 | ORAL_TABLET | Freq: Two times a day (BID) | ORAL | 3 refills | Status: DC

## 2022-11-15 ENCOUNTER — Other Ambulatory Visit: Payer: Self-pay | Admitting: Family Medicine

## 2022-11-19 NOTE — Progress Notes (Signed)
Remote ICD transmission.   

## 2022-11-24 ENCOUNTER — Telehealth (HOSPITAL_COMMUNITY): Payer: Self-pay | Admitting: Vascular Surgery

## 2022-11-24 NOTE — Telephone Encounter (Signed)
Lv to move 12/27 appt

## 2022-12-01 ENCOUNTER — Encounter (HOSPITAL_COMMUNITY): Payer: Medicare HMO | Admitting: Internal Medicine

## 2022-12-03 ENCOUNTER — Other Ambulatory Visit: Payer: Self-pay

## 2022-12-03 MED ORDER — ACCU-CHEK GUIDE VI STRP: ORAL_STRIP | 3 refills | Status: DC

## 2022-12-07 ENCOUNTER — Other Ambulatory Visit: Payer: Self-pay | Admitting: Family Medicine

## 2022-12-15 DIAGNOSIS — J209 Acute bronchitis, unspecified: Secondary | ICD-10-CM | POA: Diagnosis not present

## 2022-12-15 DIAGNOSIS — J019 Acute sinusitis, unspecified: Secondary | ICD-10-CM | POA: Diagnosis not present

## 2022-12-15 DIAGNOSIS — R070 Pain in throat: Secondary | ICD-10-CM | POA: Diagnosis not present

## 2022-12-15 DIAGNOSIS — R0981 Nasal congestion: Secondary | ICD-10-CM | POA: Diagnosis not present

## 2022-12-20 ENCOUNTER — Encounter: Payer: Self-pay | Admitting: Family Medicine

## 2022-12-20 ENCOUNTER — Telehealth: Payer: Self-pay | Admitting: Family Medicine

## 2022-12-20 NOTE — Telephone Encounter (Signed)
Patient's spouse [name] states Patient fell yesterday 12/19/22 on pavement on his left side (knee went out). Left knee is swollen but less swollen than yesterday.   States Patient is in a lot of pain in left knee and is having a hard time walking and moving. Difficulty getting out of a chair. States very painful trying to put any weight on left leg/knee.  Transferred to Triage Caryl Pina).

## 2022-12-20 NOTE — Telephone Encounter (Signed)
With level of pain sounds like will need x-ray-I sent a MyChart message to patient-if we could either quickly get him into an orthopedist if he has 1 or get him into [facility] quickly that may be more ideal as they can do x-rays in the office as well.  I just worry about him having to come here and then transfer to another office to get x-rays  It appears triage scheduled him with [facility] tomorrow-sending as a heads up-if they need to late cancel and we can get him into one of the other options then I mentioned to them I would not charge late cancellation

## 2022-12-21 ENCOUNTER — Ambulatory Visit (INDEPENDENT_AMBULATORY_CARE_PROVIDER_SITE_OTHER): Payer: Medicare HMO

## 2022-12-21 ENCOUNTER — Ambulatory Visit: Payer: Medicare HMO | Admitting: Family

## 2022-12-21 ENCOUNTER — Ambulatory Visit: Payer: Medicare HMO | Admitting: Sports Medicine

## 2022-12-21 VITALS — BP 132/82 | HR 56 | Ht 71.0 in | Wt 187.0 lb

## 2022-12-21 DIAGNOSIS — M25562 Pain in left knee: Secondary | ICD-10-CM

## 2022-12-21 DIAGNOSIS — M1712 Unilateral primary osteoarthritis, left knee: Secondary | ICD-10-CM

## 2022-12-21 NOTE — Patient Instructions (Addendum)
Good to see you Knee HEP 3-4 week follow up   

## 2022-12-21 NOTE — Telephone Encounter (Signed)
Pt is scheduled with [facility]. Will follow up with pt this morning.  Patient Name: Carl Wood Gender: Male DOB: October 24, 1941 Age: 82 Y 3 M 17 D Return Phone Number: [phone] (Primary) Address: City/ State/ Zip: [place]  [address] [name] [facility] [place] [facility] Provider [name]- MD Contact Type Call Who Is Calling Patient / Member / Family / Caregiver Call Type Triage / Clinical Caller Name [place] Relationship To Patient Spouse Return Phone Number [phone] (Primary) Chief Complaint Walking difficulty Reason for Call Symptomatic / Request for [place] states her husband fell on the pavement yesterday on the left side, his knee is swollen and having a lot of pain, difficulty walking or moving. Translation No Nurse Assessment Nurse: Clovis Riley, RN, Georgina Peer Date/Time (Eastern Time): 12/20/2022 9:32:15 AM Confirm and document reason for call. If symptomatic, describe symptoms. ---Caller states her husband fell on the pavement yesterday on the left side, his left knee is swollen and having a lot of pain, difficulty walking and is using crutches or moving. Does the patient have any new or worsening symptoms? ---Yes Will a triage be completed? ---Yes Related visit to physician within the last 2 weeks? ---No Does the PT have any chronic conditions? (i.e. diabetes, asthma, this includes High risk factors for pregnancy, etc.) ---Yes List chronic conditions. ---CAD, Pacemaker/defib, DM, back issues Is this a behavioral health or substance abuse call? ---No Guidelines Guideline Title Affirmed Question Affirmed Notes Nurse Date/Time (Eastern Time) Knee Injury [1] High-risk adult (e.g., age > 89 years, osteoporosis, chronic steroid use) AND [2] limping Clovis Riley, RNGeorgina Peer 12/20/2022 9:34:13 AM Disp. Time Eilene Ghazi Time) Disposition Final  User 12/20/2022 9:36:57 AM See PCP within 24 Hours Yes Clovis Riley, RN, Georgina Peer Final Disposition 12/20/2022 9:36:57 AM See PCP within 24 Hours Yes Clovis Riley, RN, Leilani Merl Disagree/Comply Comply Caller Understands Yes PreDisposition Call Doctor Care Advice Given Per Guideline SEE PCP WITHIN 24 HOURS: * IF OFFICE WILL BE OPEN: You need to be examined within the next 24 hours. Call your doctor (or NP/PA) when the office opens and make an appointment. CRUTCHES: * Try not to put any weight on the injured leg until evaluated. * Obtain a pair of crutches from the local pharmacy. USE A COLD PACK FOR PAIN, SWELLING, OR BRUISING: * Put a cold pack or an ice bag (wrapped in a moist towel) on the area for 20 minutes. * Repeat in 1 hour, then every 4 hours while awake. * Continue this for the first 48 hours (2 days) after an injury. USE HEAT ON AREA AFTER 48 HOURS: PAIN MEDICINES: * ACETAMINOPHEN - REGULAR STRENGTH TYLENOL: Take 650 mg (two 325 mg pills) by mouth every 4 to 6 hours as needed. Each Regular Strength Tylenol pill has 325 mg of acetaminophen. The most you should take is 10 pills a day (3,250 mg total). Note: In [place], the maximum is 12 pills a day (3,900 mg total). * IBUPROFEN (E.G., MOTRIN, ADVIL): Take 400 mg (two 200 mg pills) by mouth every 6 hours. The most you should take is 6 pills a day (1,200 mg total). CALL BACK IF: * Pain becomes severe * You become worse CARE ADVICE given per Knee Injury (Adult) guideline. Referrals REFERRED TO PCP OFFICE

## 2022-12-21 NOTE — Progress Notes (Deleted)
Patient ID: Carl Wood, adult    DOB: Jun 19, 1941, 82 y.o.   MRN: [id number]  No chief complaint on file.   HPI:           Assessment & Plan:     Subjective:    Outpatient Medications Prior to Visit  Medication Sig Dispense Refill  . Ascorbic Acid (VITAMIN C ER PO) Take 1 tablet by mouth daily.    . blood glucose meter kit and supplies KIT Dispense based on patient and insurance preference. Use up to four times daily as directed. 1 each 0  . carvedilol (COREG) 6.25 MG tablet TAKE 1 TABLET BY MOUTH TWICE DAILY WITH A MEAL 60 tablet 0  . digoxin (LANOXIN) 0.25 MG tablet Take 1/2 (one-half) tablet by mouth once daily 45 tablet 3  . furosemide (LASIX) 20 MG tablet Take 1 tablet by mouth once a week 12 tablet 0  . glimepiride (AMARYL) 4 MG tablet TAKE 2 TABLETS BY MOUTH ONCE DAILY WITH BREAKFAST 180 tablet 0  . glucose blood (ACCU-CHEK GUIDE) test strip USE UP TO FOUR TIMES DAILY Dx: E11.9 450 each 3  . JARDIANCE 10 MG TABS tablet Take 1 tablet by mouth once daily 30 tablet 0  . metFORMIN (GLUCOPHAGE-XR) 500 MG 24 hr tablet TAKE 2 TABLETS BY MOUTH IN THE MORNING AND 2 AT BEDTIME 360 tablet 0  . rosuvastatin (CRESTOR) 40 MG tablet Take 1 tablet by mouth once daily 90 tablet 0  . sacubitril-valsartan (ENTRESTO) 24-26 MG Take 1 tablet by mouth 2 (two) times daily. 180 tablet 3  . XARELTO 20 MG TABS tablet Take 1 tablet by mouth once daily 30 tablet 11   No facility-administered medications prior to visit.   Past Medical History:  Diagnosis Date  . CAD (coronary artery disease)   . CHF (congestive heart failure) ([place])   . Chronic atrial fibrillation ([place])   . Colon polyps   . Diabetes mellitus   . Diverticulosis of colon   . DIVERTICULOSIS, COLON 10/16/2006   Qualifier: Diagnosis of  By: Leanne Chang MD, [place]    . Excessive daytime sleepiness 02/19/2016  . Hyperlipidemia   . Hypertension   . MI (mitral incompetence)   . Nephrolithiasis   . NEPHROLITHIASIS 06/26/2008    Qualifier: Diagnosis of  By: Sherlynn Stalls, CMA, [place]    . OSA (obstructive sleep apnea) 02/19/2016   Moderate to severe OSA with an AHI of 25/hr and on CPAP at 8cm H2O  . PE (pulmonary embolism)   . V-tach [facility])    Past Surgical History:  Procedure Laterality Date  . BACK SURGERY    . BASAL CELL CARCINOMA EXCISION     nose  . BIV UPGRADE N/A 03/28/2017   Procedure: BiV Upgrade;  Surgeon: Deboraha Sprang, MD;  Location: [facility];  Service: Cardiovascular;  Laterality: N/A;  . CARDIAC CATHETERIZATION N/A 01/20/2016   Procedure: Right/Left Heart Cath and Coronary Angiography;  Surgeon: Jolaine Artist, MD;  Location: [facility];  Service: Cardiovascular;  Laterality: N/A;  . CARDIAC DEFIBRILLATOR PLACEMENT     medtronic virtuoso  . CHOLECYSTECTOMY    . COLONOSCOPY  12/11/2012   Procedure: COLONOSCOPY;  Surgeon: Inda Castle, MD;  Location: [facility];  Service: Endoscopy;  Laterality: N/A;  . KNEE ARTHROPLASTY     Allergies  Allergen Reactions  . Penicillins Other (See Comments)    Patient passed out  . Sulfamethoxazole Rash      Objective:  Physical Exam Vitals and nursing note reviewed.  Constitutional:      Appearance: Normal appearance.  Cardiovascular:     Rate and Rhythm: Normal rate and regular rhythm.  Pulmonary:     Effort: Pulmonary effort is normal.     Breath sounds: Normal breath sounds.  Musculoskeletal:        General: Normal range of motion.  Skin:    General: Skin is warm and dry.  Neurological:     Mental Status: He is alert.  Psychiatric:        Mood and Affect: Mood normal.        Behavior: Behavior normal.  There were no vitals taken for this visit. Wt Readings from Last 3 Encounters:  10/01/22 187 lb 9.6 oz (85.1 kg)  06/04/22 198 lb 9.6 oz (90.1 kg)  06/02/22 197 lb 3.2 oz (89.4 kg)       Jeanie Sewer, NP

## 2022-12-21 NOTE — Telephone Encounter (Signed)
Pt is scheduled with Glennon Mac at [facility] today instead of coming in with [place].

## 2022-12-21 NOTE — Progress Notes (Signed)
Carl Wood D.[place] [address] [place] Phone: [phone]   Assessment and Plan:     1. Acute pain of left knee 2. Primary osteoarthritis of left knee  -Chronic with exacerbation, initial sports medicine visit - Most consistent with flare of osteoarthritis with episodes of knee giving out - Start Tylenol 500 to 1000 mg tablets 2-3 times a day for day-to-day pain relief - Recommend using crutches over the next week for support to prevent a fall.  May wean off of crutches in 1 week and use a cane as tolerated - X-ray obtained in clinic.  My interpretation: No acute fracture or dislocation.  Severe medial compartment changes and moderate to severe patellofemoral compartment changes.  Cortical density in posterior fossa representing likely flabella.  Will await official radiology read - Patient elected for intra-articular knee CSI.  Tolerated well per note below.  Patient may experience increased bleeding while on Xarelto and may increase blood glucose with past medical history DM type II - Do not recommend NSAID use due to patient's chronic anticoagulation on Xarelto  Procedure: Knee Joint Injection Side: Left Indication: Flare of osteoarthritis  Risks explained and consent was given verbally. The site was cleaned with alcohol prep. A needle was introduced with an anterio-lateral approach. Injection given using 65m of 1% lidocaine without epinephrine and 182mof kenalog '40mg'$ /ml. This was well tolerated and resulted in symptomatic relief.  Needle was removed, hemostasis achieved, and post injection instructions were explained.   Pt was advised to call or return to clinic if these symptoms worsen or fail to improve as anticipated.   Pertinent previous records reviewed include none   Follow Up: 3 weeks for reevaluation.  Could consider physical therapy versus advanced imaging based on presentation   Subjective:   I, MoFortunaam serving as a scEducation administratoror Doctor BeGlennon MacChief Complaint: left knee pain   HPI:   12/21/22 Patient is a [age]ear old male complaining of left knee pain. Patient states that Sunday he fell at church out of his truck his left knee gave out , hx of surgery 25 years ago, hurt when he bares weight was TTP on Sunday , no radiating pain , no numbness or tingling, no meds , is using crutches, pain with flexion    Relevant Historical Information: Chronic anticoagulation on Xarelto with history of pulmonary embolism, DM type II, CHF with ICD in place, history of atrial fibrillation, hypertension  Additional pertinent review of systems negative.   Current Outpatient Medications:    Ascorbic Acid (VITAMIN C ER PO), Take 1 tablet by mouth daily., Disp: , Rfl:    blood glucose meter kit and supplies KIT, Dispense based on patient and insurance preference. Use up to four times daily as directed., Disp: 1 each, Rfl: 0   carvedilol (COREG) 6.25 MG tablet, TAKE 1 TABLET BY MOUTH TWICE DAILY WITH A MEAL, Disp: 60 tablet, Rfl: 0   digoxin (LANOXIN) 0.25 MG tablet, Take 1/2 (one-half) tablet by mouth once daily, Disp: 45 tablet, Rfl: 3   furosemide (LASIX) 20 MG tablet, Take 1 tablet by mouth once a week, Disp: 12 tablet, Rfl: 0   glimepiride (AMARYL) 4 MG tablet, TAKE 2 TABLETS BY MOUTH ONCE DAILY WITH BREAKFAST, Disp: 180 tablet, Rfl: 0   glucose blood (ACCU-CHEK GUIDE) test strip, USE UP TO FOUR TIMES DAILY Dx: E11.9, Disp: 450 each, Rfl: 3   JARDIANCE 10 MG TABS  tablet, Take 1 tablet by mouth once daily, Disp: 30 tablet, Rfl: 0   metFORMIN (GLUCOPHAGE-XR) 500 MG 24 hr tablet, TAKE 2 TABLETS BY MOUTH IN THE MORNING AND 2 AT BEDTIME, Disp: 360 tablet, Rfl: 0   rosuvastatin (CRESTOR) 40 MG tablet, Take 1 tablet by mouth once daily, Disp: 90 tablet, Rfl: 0   sacubitril-valsartan (ENTRESTO) 24-26 MG, Take 1 tablet by mouth 2 (two) times daily., Disp: 180 tablet, Rfl: 3   XARELTO 20 MG TABS  tablet, Take 1 tablet by mouth once daily, Disp: 30 tablet, Rfl: 11   Objective:     Vitals:   12/21/22 1055  BP: 132/82  Pulse: (!) 56  SpO2: 94%  Weight: 187 lb (84.8 kg)  Height: '5\' 11"'$  (1.803 m)      Body mass index is 26.08 kg/m.    Physical Exam:    General:  awake, alert oriented, no acute distress nontoxic Skin: no suspicious lesions or rashes Neuro:sensation intact and strength 5/5 with no deficits, no atrophy, normal muscle tone Psych: No signs of anxiety, depression or other mood disorder  Left knee: Mild swelling No deformity Neg fluid wave, joint milking ROM Flex 80, Ext 20 TTP superior patella, medial joint line  NTTP over the quad tendon, medial fem condyle, lat fem condyle, patella, plica, patella tendon, tibial tuberostiy, fibular head, posterior fossa, pes anserine bursa, gerdy's tubercle, lateral jt line Neg anterior and posterior drawer Neg lachman Neg sag sign Negative varus stress Negative valgus stress Negative McMurray, though reproduced pain  Gait antalgic, walking partial weightbearing using crutches   Electronically signed by:  Carl Wood D.[facility] 11:39 AM 12/21/22

## 2022-12-21 NOTE — Addendum Note (Signed)
Addended by: Glennon Mac on: 12/21/2022 11:39 AM   Modules accepted: Level of Service

## 2022-12-29 ENCOUNTER — Encounter: Payer: Self-pay | Admitting: Cardiology

## 2022-12-29 ENCOUNTER — Ambulatory Visit: Payer: Medicare HMO | Attending: Cardiology | Admitting: Cardiology

## 2022-12-29 VITALS — BP 100/54 | HR 88 | Ht 72.0 in | Wt 186.0 lb

## 2022-12-29 DIAGNOSIS — E1159 Type 2 diabetes mellitus with other circulatory complications: Secondary | ICD-10-CM

## 2022-12-29 DIAGNOSIS — G4733 Obstructive sleep apnea (adult) (pediatric): Secondary | ICD-10-CM | POA: Diagnosis not present

## 2022-12-29 DIAGNOSIS — I152 Hypertension secondary to endocrine disorders: Secondary | ICD-10-CM | POA: Diagnosis not present

## 2022-12-29 NOTE — Progress Notes (Signed)
Cardiology Office Note  ID:  [place], [place] December 17, 1940, MRN [id number]  PCP:  Marin Olp, MD  Cardiologist:  Glori Bickers, MD Sleep Medicine:  Fransico Him, MD Electrophysiologist:  None   Chief Complaint:  OSA  History of Present Illness:    Carl Wood is a 82 y.o. adult with a hx of moderate to severe OSA with an AHI of 25.2/hr and is on CPAP at 8cm H2O.    He is doing well with his PAP device although he says that it is still aggravating.  He had a sinus infection recently and has not been using his device for 2 weeks.   He tolerates the mask and feels the pressure is adequate. He cannot tell any difference in how he feels from a sleepy standpoint when he uses it.   He denies any significant nasal dryness or nasal congestion.  He occasionally has dry mouth.  He does not think that he snores.    Prior CV studies:   The following studies were reviewed today:  PAP compliance download  Past Medical History:  Diagnosis Date   CAD (coronary artery disease)    CHF (congestive heart failure) (HCC)    Chronic atrial fibrillation (HCC)    Colon polyps    Diabetes mellitus    Diverticulosis of colon    DIVERTICULOSIS, COLON 10/16/2006   Qualifier: Diagnosis of  By: Leanne Chang MD, Bruce     Excessive daytime sleepiness 02/19/2016   Hyperlipidemia    Hypertension    MI (mitral incompetence)    Nephrolithiasis    NEPHROLITHIASIS 06/26/2008   Qualifier: Diagnosis of  By: Sherlynn Stalls, CMA, Cindy     OSA (obstructive sleep apnea) 02/19/2016   Moderate to severe OSA with an AHI of 25/hr and on CPAP at 8cm H2O   PE (pulmonary embolism)    V[facility]tach [facility])    Past Surgical History:  Procedure Laterality Date   BACK SURGERY     BASAL CELL CARCINOMA EXCISION     nose   BIV UPGRADE N/A 03/28/2017   Procedure: BiV Upgrade;  Surgeon: Deboraha Sprang, MD;  Location: [facility];  Service: Cardiovascular;  Laterality: N/A;   CARDIAC CATHETERIZATION N/A 01/20/2016    Procedure: Right/Left Heart Cath and Coronary Angiography;  Surgeon: Jolaine Artist, MD;  Location: [facility];  Service: Cardiovascular;  Laterality: N/A;   CARDIAC DEFIBRILLATOR PLACEMENT     medtronic virtuoso   CHOLECYSTECTOMY     COLONOSCOPY  12/11/2012   Procedure: COLONOSCOPY;  Surgeon: Inda Castle, MD;  Location: [facility];  Service: Endoscopy;  Laterality: N/A;   KNEE ARTHROPLASTY       Current Meds  Medication Sig   Ascorbic Acid (VITAMIN C ER PO) Take 1 tablet by mouth daily.   blood glucose meter kit and supplies KIT Dispense based on patient and insurance preference. Use up to four times daily as directed.   carvedilol (COREG) 6.25 MG tablet TAKE 1 TABLET BY MOUTH TWICE DAILY WITH A MEAL   digoxin (LANOXIN) 0.25 MG tablet Take 1/2 (one-half) tablet by mouth once daily   furosemide (LASIX) 20 MG tablet Take 1 tablet by mouth once a week   glimepiride (AMARYL) 4 MG tablet TAKE 2 TABLETS BY MOUTH ONCE DAILY WITH BREAKFAST   glucose blood (ACCU-CHEK GUIDE) test strip USE UP TO FOUR TIMES DAILY Dx: E11.9   JARDIANCE 10 MG TABS tablet Take 1 tablet by mouth once daily  metFORMIN (GLUCOPHAGE-XR) 500 MG 24 hr tablet TAKE 2 TABLETS BY MOUTH IN THE MORNING AND 2 AT BEDTIME   rosuvastatin (CRESTOR) 40 MG tablet Take 1 tablet by mouth once daily   sacubitril-valsartan (ENTRESTO) 24-26 MG Take 1 tablet by mouth 2 (two) times daily.   XARELTO 20 MG TABS tablet Take 1 tablet by mouth once daily     Allergies:   Penicillins and Sulfamethoxazole   Social History   Tobacco Use   Smoking status: Former    Packs/day: 1.00    Years: 50.00    Total pack years: 50.00    Types: Cigarettes    Quit date: 12/15/2012    Years since quitting: 10.0   Smokeless tobacco: Never  Vaping Use   Vaping Use: Never used  Substance Use Topics   Alcohol use: No    Alcohol/week: 0.0 standard drinks of alcohol   Drug use: No     Family Hx: The patient's family history includes  Bladder Cancer in his mother; Colon cancer in his father; Diabetes in his father; Heart attack (age of onset: 67) in his child; Heart attack (age of onset: 67) in his child; Heart disease in his father and mother.  ROS:   Please see the history of present illness.     All other systems reviewed and are negative.   Labs/Other Tests and Data Reviewed:    Recent Labs: 01/13/2022: B Natriuretic Peptide 79.4 06/02/2022: Hemoglobin 16.1; Platelets 176.0 10/01/2022: ALT 18; BUN 22; Creatinine, Ser 0.84; Potassium 4.9; Sodium 140   Recent Lipid Panel Lab Results  Component Value Date/Time   CHOL 118 03/29/2022 02:09 PM   TRIG 185.0 (H) 03/29/2022 02:09 PM   TRIG 53 10/10/2006 11:13 AM   HDL 37.10 (L) 03/29/2022 02:09 PM   CHOLHDL 3 03/29/2022 02:09 PM   LDLCALC 44 03/29/2022 02:09 PM   LDLCALC 76 07/03/2020 02:17 PM   LDLDIRECT 77 11/06/2020 02:28 PM    Wt Readings from Last 3 Encounters:  12/29/22 186 lb (84.4 kg)  12/21/22 187 lb (84.8 kg)  10/01/22 187 lb 9.6 oz (85.1 kg)     Objective:    Vital Signs:  BP (!) 100/54   Pulse 88   Ht 6' (1.829 m)   Wt 186 lb (84.4 kg)   SpO2 95%   BMI 25.23 kg/m   GEN: Well nourished, well developed in no acute distress HEENT: Normal NECK: No JVD; No carotid bruits LYMPHATICS: No lymphadenopathy CARDIAC:RRR, no murmurs, rubs, gallops RESPIRATORY:  Clear to auscultation without rales, wheezing or rhonchi  ABDOMEN: Soft, non-tender, non-distended MUSCULOSKELETAL:  No edema; No deformity  SKIN: Warm and dry NEUROLOGIC:  Alert and oriented x 3 PSYCHIATRIC:  Normal affect  ASSESSMENT & PLAN:    1. OSA - The patient is tolerating PAP therapy well without any problems. The PAP download performed by his DME was personally reviewed and interpreted by me today and showed an AHI of 3.2/hr on 10 cm H2O with 50% compliance in using more than 4 hours nightly.  The patient has been using and benefiting from PAP use and will continue to benefit from  therapy.  -I encouraged him to be more compliant with his device>>he did not use it for 2 weeks due to a sinus infection.    2.  HTN -BP is adequately controlled on today -Continue prescription drug management with carvedilol 6.25 mg twice daily and Entresto 24-26 mg twice daily with as needed refills  Medication Adjustments/Labs and Tests  Ordered: Current medicines are reviewed at length with the patient today.  Concerns regarding medicines are outlined above.  Tests Ordered: No orders of the defined types were placed in this encounter.  Medication Changes: No orders of the defined types were placed in this encounter.   Disposition:  Follow up in 1 year(s)  Signed, Fransico Him, MD  12/29/2022 10:43 AM    [place]

## 2022-12-29 NOTE — Patient Instructions (Signed)
Medication Instructions:  Your physician recommends that you continue on your current medications as directed. Please refer to the Current Medication list given to you today.  *If you need a refill on your cardiac medications before your next appointment, please call your pharmacy*   Lab Work: None.  If you have labs (blood work) drawn today and your tests are completely normal, you will receive your results only by: [place] (if you have MyChart) OR A paper copy in the mail If you have any lab test that is abnormal or we need to change your treatment, we will call you to review the results.   Testing/Procedures: None.   Follow-Up: At [facility], you and your health needs are our priority.  As part of our continuing mission to provide you with exceptional heart care, we have created designated Provider Care Teams.  These Care Teams include your primary Cardiologist (physician) and Advanced Practice Providers (APPs -  Physician Assistants and Nurse Practitioners) who all work together to provide you with the care you need, when you need it.  We recommend signing up for the patient portal called "MyChart".  Sign up information is provided on this After Visit Summary.  MyChart is used to connect with patients for Virtual Visits (Telemedicine).  Patients are able to view lab/test results, encounter notes, upcoming appointments, etc.  Non-urgent messages can be sent to your provider as well.   To learn more about what you can do with MyChart, go to [email or web address].    Your next appointment:   1 year(s)  Provider:   Dr. Fransico Him, MD   Other Instructions Our sleep team will contact you regarding setting you up with [facility]. They will also need to set up an appointment to teach you how to use humidity on your device.

## 2022-12-30 ENCOUNTER — Telehealth: Payer: Self-pay | Admitting: *Deleted

## 2022-12-30 DIAGNOSIS — I10 Essential (primary) hypertension: Secondary | ICD-10-CM

## 2022-12-30 DIAGNOSIS — G4733 Obstructive sleep apnea (adult) (pediatric): Secondary | ICD-10-CM

## 2022-12-30 NOTE — Telephone Encounter (Signed)
Upon patient request DME selection is [facility]. Patient understands he will be contacted by [facility] to set up his cpap.Patient understands to call if [facility] does not contact him with new setup in a timely manner. Patient understands they will be called once confirmation has been received from [facility]/ that they have received their new machine to schedule 10 week follow up appointment.  [facility] notified of new cpap order  Please add to airview Patient was grateful for the call and thanked me. 

## 2022-12-30 NOTE — Telephone Encounter (Signed)
-----  Message from Joni Reining, RN sent at 12/29/2022 10:56 AM EST ----- Regarding: [place], Per Dr. Radford Pax, could you get patient set back up with [facility]? He also needs and appointment with them so they can teach him how to use humidity.  Thanks, Danae Chen

## 2023-01-02 ENCOUNTER — Other Ambulatory Visit: Payer: Self-pay | Admitting: Family Medicine

## 2023-01-03 DIAGNOSIS — R509 Fever, unspecified: Secondary | ICD-10-CM | POA: Diagnosis not present

## 2023-01-03 DIAGNOSIS — J209 Acute bronchitis, unspecified: Secondary | ICD-10-CM | POA: Diagnosis not present

## 2023-01-03 DIAGNOSIS — R051 Acute cough: Secondary | ICD-10-CM | POA: Diagnosis not present

## 2023-01-05 ENCOUNTER — Ambulatory Visit (HOSPITAL_COMMUNITY)
Admission: RE | Admit: 2023-01-05 | Discharge: 2023-01-05 | Disposition: A | Discharge status: Home / Self Care | Payer: Medicare HMO | Source: Ambulatory Visit | Attending: Internal Medicine | Admitting: Internal Medicine

## 2023-01-05 ENCOUNTER — Encounter (HOSPITAL_COMMUNITY): Payer: Self-pay | Admitting: Internal Medicine

## 2023-01-05 VITALS — BP 92/60 | HR 86 | Wt 183.0 lb

## 2023-01-05 DIAGNOSIS — I110 Hypertensive heart disease with heart failure: Secondary | ICD-10-CM | POA: Diagnosis not present

## 2023-01-05 DIAGNOSIS — Z87891 Personal history of nicotine dependence: Secondary | ICD-10-CM | POA: Diagnosis not present

## 2023-01-05 DIAGNOSIS — Z8249 Family history of ischemic heart disease and other diseases of the circulatory system: Secondary | ICD-10-CM | POA: Insufficient documentation

## 2023-01-05 DIAGNOSIS — Z7984 Long term (current) use of oral hypoglycemic drugs: Secondary | ICD-10-CM | POA: Diagnosis not present

## 2023-01-05 DIAGNOSIS — E119 Type 2 diabetes mellitus without complications: Secondary | ICD-10-CM | POA: Insufficient documentation

## 2023-01-05 DIAGNOSIS — I5022 Chronic systolic (congestive) heart failure: Secondary | ICD-10-CM | POA: Insufficient documentation

## 2023-01-05 DIAGNOSIS — G4733 Obstructive sleep apnea (adult) (pediatric): Secondary | ICD-10-CM | POA: Diagnosis not present

## 2023-01-05 DIAGNOSIS — Z8673 Personal history of transient ischemic attack (TIA), and cerebral infarction without residual deficits: Secondary | ICD-10-CM | POA: Insufficient documentation

## 2023-01-05 DIAGNOSIS — Z7901 Long term (current) use of anticoagulants: Secondary | ICD-10-CM | POA: Insufficient documentation

## 2023-01-05 DIAGNOSIS — I482 Chronic atrial fibrillation, unspecified: Secondary | ICD-10-CM | POA: Diagnosis not present

## 2023-01-05 DIAGNOSIS — I2510 Atherosclerotic heart disease of native coronary artery without angina pectoris: Secondary | ICD-10-CM | POA: Diagnosis not present

## 2023-01-05 DIAGNOSIS — I252 Old myocardial infarction: Secondary | ICD-10-CM | POA: Diagnosis not present

## 2023-01-05 DIAGNOSIS — Z79899 Other long term (current) drug therapy: Secondary | ICD-10-CM | POA: Diagnosis not present

## 2023-01-05 DIAGNOSIS — Z95810 Presence of automatic (implantable) cardiac defibrillator: Secondary | ICD-10-CM | POA: Insufficient documentation

## 2023-01-05 LAB — BASIC METABOLIC PANEL
Anion gap: 7 (ref 5–15)
BUN: 18 mg/dL (ref 8–23)
CO2: 28 mmol/L (ref 22–32)
Calcium: 9.1 mg/dL (ref 8.9–10.3)
Chloride: 101 mmol/L (ref 98–111)
Creatinine, Ser: 0.82 mg/dL (ref 0.61–1.24)
GFR, Estimated: >60 mL/min (ref >60)
Glucose, Bld: 83 mg/dL (ref 70–99)
Potassium: 3.9 mmol/L (ref 3.5–5.1)
Sodium: 136 mmol/L (ref 135–145)

## 2023-01-05 LAB — CBC
HCT: 48.2 % (ref 39.0–52.0)
Hemoglobin: 16.5 g/dL (ref 13.0–17.0)
MCH: 32.9 pg (ref 26.0–34.0)
MCHC: 34.2 g/dL (ref 30.0–36.0)
MCV: 96 fL (ref 80.0–100.0)
Platelets: 149 10*3/uL — ABNORMAL LOW (ref 150–400)
RBC: 5.02 MIL/uL (ref 4.22–5.81)
RDW: 13.3 % (ref 11.5–15.5)
WBC: 5.8 10*3/uL (ref 4.0–10.5)
nRBC: 0 % (ref 0.0–0.2)

## 2023-01-05 LAB — BRAIN NATRIURETIC PEPTIDE: B Natriuretic Peptide: 116.1 pg/mL — ABNORMAL HIGH (ref 0.0–100.0)

## 2023-01-05 LAB — DIGOXIN LEVEL: Digoxin Level: 0.2 ng/mL — ABNORMAL LOW (ref 0.8–2.0)

## 2023-01-05 NOTE — Progress Notes (Addendum)
[facility] Note    Date:  01/05/2023   ID:  Carl Wood, DOB 28-Oct-1941, MRN [id number]  Location: Home  Provider location: [address], [place] Type of Visit: Established patient PCP:  Marin Olp, MD  Cardiologist:Dr Turner  Primary HF: Dr Haroldine Laws  EP: Dr Caryl Comes   History of Present Illness:  Mr Sumler is a 82 y/o male with CAD s/p MI 1983, ICM with chronic systolic heart failure s/p Medtronic single chamber ICD (EF 20%) CVA 2014, DM2 VT, permanent A fib on Xarelto, SDH 2019, OSA, and former smoker.   Had CRT-D upgrade in April 2018.    In 12/18 saw Dr. Tomi Likens for chronic HA. Found to have small SDHs that were felt to be chronic after a previous fall when trimming vines. Xarelto stopped. F/u C 2/19 SDHs resolved. Xarelto restarted in 2/19  Echo 1/21   EF 20-25%  Echo 01/13/22 EF 25-30% RV ok   ABIs 2017 No PAD  Here for f/u with his wife. Says he had some LE edema over the holidays. No improved. Able to do all ADLs. Main complaint is pain weakness in left knee. Seeing Ortho. Getting injections. No CP. No bleeding on Xarelto.    ICD interrogation: No VT. Fluid ok Activity 2hr/day. 100% biv pacing Personally reviewed     Past Medical History:  Diagnosis Date   CAD (coronary artery disease)    CHF (congestive heart failure) (HCC)    Chronic atrial fibrillation (HCC)    Colon polyps    Diabetes mellitus    Diverticulosis of colon    DIVERTICULOSIS, COLON 10/16/2006   Qualifier: Diagnosis of  By: Leanne Chang MD, Bruce     Excessive daytime sleepiness 02/19/2016   Hyperlipidemia    Hypertension    MI (mitral incompetence)    Nephrolithiasis    NEPHROLITHIASIS 06/26/2008   Qualifier: Diagnosis of  By: Sherlynn Stalls, CMA, Cindy     OSA (obstructive sleep apnea) 02/19/2016   Moderate to severe OSA with an AHI of 25/hr and on CPAP at 8cm H2O   PE (pulmonary embolism)    V-tach [facility])    Past Surgical History:  Procedure Laterality  Date   BACK SURGERY     BASAL CELL CARCINOMA EXCISION     nose   BIV UPGRADE N/A 03/28/2017   Procedure: BiV Upgrade;  Surgeon: Deboraha Sprang, MD;  Location: [facility] [place] [facility];  Service: Cardiovascular;  Laterality: N/A;   CARDIAC CATHETERIZATION N/A 01/20/2016   Procedure: Right/Left Heart Cath and Coronary Angiography;  Surgeon: Jolaine Artist, MD;  Location: [place] [facility];  Service: Cardiovascular;  Laterality: N/A;   CARDIAC DEFIBRILLATOR PLACEMENT     medtronic virtuoso   CHOLECYSTECTOMY     COLONOSCOPY  12/11/2012   Procedure: COLONOSCOPY;  Surgeon: Inda Castle, MD;  [facility]: [facility];  Service: Endoscopy;  Laterality: N/A;   KNEE ARTHROPLASTY       Current Outpatient Medications  Medication Sig Dispense Refill   Ascorbic Acid (VITAMIN C ER PO) Take 1 tablet by mouth daily.     blood glucose meter kit and supplies KIT Dispense based on patient and insurance preference. Use up to four times daily as directed. 1 each 0   carvedilol (COREG) 6.25 MG tablet TAKE 1 TABLET BY MOUTH TWICE DAILY WITH A MEAL 60 tablet 0   cetirizine (ZYRTEC) 10 MG tablet Take 10 mg by mouth daily.  digoxin (LANOXIN) 0.25 MG tablet Take 1/2 (one-half) tablet by mouth once daily 45 tablet 3   furosemide (LASIX) 20 MG tablet Take 1 tablet by mouth once a week 12 tablet 0   glimepiride (AMARYL) 4 MG tablet TAKE 2 TABLETS BY MOUTH ONCE DAILY WITH BREAKFAST 180 tablet 0   glucose blood (ACCU-CHEK GUIDE) test strip USE UP TO FOUR TIMES DAILY Dx: E11.9 450 each 3   JARDIANCE 10 MG TABS tablet Take 1 tablet by mouth once daily 30 tablet 0   metFORMIN (GLUCOPHAGE-XR) 500 MG 24 hr tablet TAKE 2 TABLETS BY MOUTH IN THE MORNING AND 2 AT BEDTIME 360 tablet 0   rosuvastatin (CRESTOR) 40 MG tablet Take 1 tablet by mouth once daily 90 tablet 0   sacubitril-valsartan (ENTRESTO) 24-26 MG Take 1 tablet by mouth 2 (two) times daily. 180 tablet 3   XARELTO 20 MG TABS tablet Take 1 tablet by mouth once daily  30 tablet 11   No current facility-administered medications for this encounter.    Allergies:   Penicillins and Sulfamethoxazole   Social History:  The patient  reports that he quit smoking about 10 years ago. His smoking use included cigarettes. He has a 50.00 pack-year smoking history. He has never used smokeless tobacco. He reports that he does not drink alcohol and does not use drugs.   Family History:  The patient's family history includes Bladder Cancer in his mother; Colon cancer in his father; Diabetes in his father; Heart attack (age of onset: 13) in his child; Heart attack (age of onset: 42) in his child; Heart disease in his father and mother.   ROS:  Please see the history of present illness.   All other systems are personally reviewed and negative.   Vitals:   01/05/23 1528  BP: 92/60  Pulse: 86  SpO2: 96%    Exam:  General:  Elderly. No resp difficulty HEENT: normal Neck: supple. no JVD. Carotids 2+ bilat; no bruits. No lymphadenopathy or thryomegaly appreciated. Cor: PMI nondisplaced. Regular rate & rhythm. No rubs, gallops or murmurs. Lungs: clear Abdomen: soft, nontender, nondistended. No hepatosplenomegaly. No bruits or masses. Good bowel sounds. Extremities: no cyanosis, clubbing, rash, edema Neuro: alert & orientedx3, cranial nerves grossly intact. moves all 4 extremities w/o difficulty. Affect pleasant   Recent Labs: 01/13/2022: B Natriuretic Peptide 79.4 06/02/2022: Hemoglobin 16.1; Platelets 176.0 10/01/2022: ALT 18; BUN 22; Creatinine, Ser 0.84; Potassium 4.9; Sodium 140   Wt Readings from Last 3 Encounters:  01/05/23 83 kg (183 lb)  12/29/22 84.4 kg (186 lb)  12/21/22 84.8 kg (187 lb)    ASSESSMENT AND PLAN:  1. Chronic Systolic HF: ICM, s/p Medtronic Bi-V ICD (upgrade 4/18). Echo 09/2016 EF 25-30%. ECHO 8/19 EF 20% - Echo 12/12/19 EF 20-25% RV ok  - Stable NYHA III - Volume status ok on exam and ICD interrogation (fluid was up and now back down)  -  Echo  01/13/22 EF stable 25-30% RV ok - Continue carvedilol 3.125 bid (reduced previously due to low BP) - Continue digoxin.  - Continue Entresto to 24/26 mg BID.  - Continue Jardiance. - No spiro with history of hyperkalemia and soft BP.  - BP too soft to titrate meds. If SBP persistently < 100 can switch Entresto to losartan - Check labs and digoxin level   2. OSA - Continue CPAP. Follows with Dr. Radford Pax - Follows with Dr. Radford Pax. Downloads have looked good    3. Chronic A fib - Rate  controlled - Xarelto restarted after resolution of SDH. No evidence of current bleeding   4. CAD: - Has known distal LAD lesion 95% and mid LAD 40%. No benefit to revascularization given wall motion abnormality.  - No s/s angina - Continue statin. Off ASA with Xarelto   Signed, Glori Bickers, MD  3:53 PM   [facility] [address] [facility] [place] [phone] (office) [phone] (fax)

## 2023-01-05 NOTE — Progress Notes (Signed)
Medication Samples have been provided to the patient.  Drug name: Xarelto       Strength: 20 mg        Qty: 4  LOT: [id number]  Exp.Date: 09/2023  Dosing instructions: Take 1 tablet daily  The patient has been instructed regarding the correct time, dose, and frequency of taking this medication, including desired effects and most common side effects.   [name] Damaris Abeln 4:15 PM 01/05/2023

## 2023-01-05 NOTE — Patient Instructions (Signed)
There has been no changes to your medications.  Labs done today, your results will be available in MyChart, we will contact you for abnormal readings.  Your physician has requested that you have an echocardiogram. Echocardiography is a painless test that uses sound waves to create images of your heart. It provides your doctor with information about the size and shape of your heart and how well your heart's chambers and valves are working. This procedure takes approximately one hour. There are no restrictions for this procedure. Please do NOT wear cologne, perfume, aftershave, or lotions (deodorant is allowed). Please arrive 15 minutes prior to your appointment time.  Your physician recommends that you schedule a follow-up appointment in: 6 months with an echocardiogram ( July 2024)  ** please call the office in May to arrange your follow up appointment. **  If you have any questions or concerns before your next appointment please send [place] a message through [place] or call our office at [phone].    TO LEAVE A MESSAGE FOR THE NURSE SELECT OPTION 2, PLEASE LEAVE A MESSAGE INCLUDING: YOUR NAME DATE OF BIRTH CALL BACK NUMBER REASON FOR CALL**this is important as we prioritize the call backs  YOU WILL RECEIVE A CALL BACK THE SAME DAY AS LONG AS YOU CALL BEFORE 4:00 PM At the [facility], you and your health needs are our priority. As part of our continuing mission to provide you with exceptional heart care, we have created designated Provider Care Teams. These Care Teams include your primary Cardiologist (physician) and Advanced Practice Providers (APPs- Physician Assistants and Nurse Practitioners) who all work together to provide you with the care you need, when you need it.   You may see any of the following providers on your designated Care Team at your next follow up: Dr Glori Bickers Dr Loralie Champagne Dr. Roxana Hires, NP Lyda Jester, [place] [facility] [place], [place] Forestine Na, NP Audry Riles, PharmD   Please be sure to bring in all your medications bottles to every appointment.    Thank you for choosing [facility]

## 2023-01-08 ENCOUNTER — Other Ambulatory Visit (HOSPITAL_COMMUNITY): Payer: Self-pay | Admitting: Internal Medicine

## 2023-01-08 ENCOUNTER — Other Ambulatory Visit: Payer: Self-pay | Admitting: Family Medicine

## 2023-01-10 NOTE — Progress Notes (Unsigned)
    Carl Wood D.[place] Phone: [phone]   Assessment and Plan:     There are no diagnoses linked to this encounter.  ***   Pertinent previous records reviewed include ***   Follow Up: ***     Subjective:   I, Carl Wood, am serving as a Education administrator for Carl Wood   Chief Complaint: left knee pain    HPI:    12/21/22 Patient is a 82 year old male complaining of left knee pain. Patient states that Sunday he fell at church out of his truck his left knee gave out , hx of surgery 25 years ago, hurt when he bares weight was TTP on Sunday , no radiating pain , no numbness or tingling, no meds , is using crutches, pain with flexion    01/11/2023 Patient states    Relevant Historical Information: Chronic anticoagulation on Xarelto with history of pulmonary embolism, DM type II, CHF with ICD in place, history of atrial fibrillation, hypertension  Additional pertinent review of systems negative.   Current Outpatient Medications:    Ascorbic Acid (VITAMIN C ER PO), Take 1 tablet by mouth daily., Disp: , Rfl:    blood glucose meter kit and supplies KIT, Dispense based on patient and insurance preference. Use up to four times daily as directed., Disp: 1 each, Rfl: 0   carvedilol (COREG) 6.25 MG tablet, TAKE 1 TABLET BY MOUTH TWICE DAILY WITH A MEAL, Disp: 60 tablet, Rfl: 0   cetirizine (ZYRTEC) 10 MG tablet, Take 10 mg by mouth daily., Disp: , Rfl:    digoxin (LANOXIN) 0.25 MG tablet, Take 1/2 (one-half) tablet by mouth once daily, Disp: 45 tablet, Rfl: 3   furosemide (LASIX) 20 MG tablet, Take 1 tablet by mouth once a week, Disp: 12 tablet, Rfl: 0   glimepiride (AMARYL) 4 MG tablet, TAKE 2 TABLETS BY MOUTH ONCE DAILY WITH BREAKFAST, Disp: 180 tablet, Rfl: 0   glucose blood (ACCU-CHEK GUIDE) test strip, USE UP TO FOUR TIMES DAILY Dx: E11.9, Disp: 450 each, Rfl: 3   JARDIANCE 10 MG TABS tablet,  Take 1 tablet by mouth once daily, Disp: 30 tablet, Rfl: 0   metFORMIN (GLUCOPHAGE-XR) 500 MG 24 hr tablet, TAKE 2 TABLETS BY MOUTH IN THE MORNING AND 2 AT BEDTIME, Disp: 360 tablet, Rfl: 0   rosuvastatin (CRESTOR) 40 MG tablet, Take 1 tablet by mouth once daily, Disp: 90 tablet, Rfl: 0   sacubitril-valsartan (ENTRESTO) 24-26 MG, Take 1 tablet by mouth 2 (two) times daily., Disp: 180 tablet, Rfl: 3   XARELTO 20 MG TABS tablet, Take 1 tablet by mouth once daily, Disp: 30 tablet, Rfl: 11   Objective:     There were no vitals filed for this visit.    There is no height or weight on file to calculate BMI.    Physical Exam:    ***   Electronically signed by:  Carl Wood D.[facility] 11:59 AM 01/10/23

## 2023-01-11 ENCOUNTER — Ambulatory Visit (INDEPENDENT_AMBULATORY_CARE_PROVIDER_SITE_OTHER): Payer: Medicare HMO | Admitting: Sports Medicine

## 2023-01-11 ENCOUNTER — Telehealth: Payer: Self-pay | Admitting: Cardiology

## 2023-01-11 VITALS — BP 120/78 | HR 69 | Ht 72.0 in | Wt 185.0 lb

## 2023-01-11 DIAGNOSIS — M25562 Pain in left knee: Secondary | ICD-10-CM

## 2023-01-11 DIAGNOSIS — M1712 Unilateral primary osteoarthritis, left knee: Secondary | ICD-10-CM

## 2023-01-11 NOTE — Patient Instructions (Signed)
Good to see you   

## 2023-01-11 NOTE — Telephone Encounter (Signed)
Patient calling to follow up on his CPAP.

## 2023-01-13 ENCOUNTER — Other Ambulatory Visit (HOSPITAL_COMMUNITY): Payer: Self-pay | Admitting: Internal Medicine

## 2023-01-13 ENCOUNTER — Telehealth: Payer: Self-pay | Admitting: Cardiology

## 2023-01-13 DIAGNOSIS — I6522 Occlusion and stenosis of left carotid artery: Secondary | ICD-10-CM

## 2023-01-13 DIAGNOSIS — I6523 Occlusion and stenosis of bilateral carotid arteries: Secondary | ICD-10-CM

## 2023-01-13 NOTE — Telephone Encounter (Signed)
Patient called to follow-up with Sleep Team regarding how to use the humidity and the contact for [place].

## 2023-01-14 NOTE — Telephone Encounter (Signed)
Reached out to patient and gave him the number to [employer] to get assistance with his humidity.

## 2023-01-25 ENCOUNTER — Ambulatory Visit (INDEPENDENT_AMBULATORY_CARE_PROVIDER_SITE_OTHER): Payer: Medicare HMO

## 2023-01-25 DIAGNOSIS — I255 Ischemic cardiomyopathy: Secondary | ICD-10-CM | POA: Diagnosis not present

## 2023-01-25 LAB — CUP PACEART REMOTE DEVICE CHECK
Battery Remaining Longevity: 9 mo
Battery Voltage: 2.86 V
Brady Statistic AP VP Percent: 0 %
Brady Statistic AP VS Percent: 0 %
Brady Statistic AS VP Percent: 0 %
Brady Statistic AS VS Percent: 0 %
Brady Statistic RA Percent Paced: 0 %
Brady Statistic RV Percent Paced: 95.79 %
Date Time Interrogation Session: 20240220012504
HighPow Impedance: 73 Ohm
Implantable Lead Connection Status: 753985
Implantable Lead Connection Status: 753985
Implantable Lead Implant Date: 20010723
Implantable Lead Implant Date: 20180423
Implantable Lead Location: 753858
Implantable Lead Location: 753860
Implantable Lead Model: 6943
Implantable Pulse Generator Implant Date: 20180423
Lead Channel Impedance Value: 246.635
Lead Channel Impedance Value: 246.635
Lead Channel Impedance Value: 255.093
Lead Channel Impedance Value: 256.5 Ohm
Lead Channel Impedance Value: 265.661
Lead Channel Impedance Value: 285 Ohm
Lead Channel Impedance Value: 399 Ohm
Lead Channel Impedance Value: 4047 Ohm
Lead Channel Impedance Value: 475 Ohm
Lead Channel Impedance Value: 513 Ohm
Lead Channel Impedance Value: 513 Ohm
Lead Channel Impedance Value: 551 Ohm
Lead Channel Impedance Value: 874 Ohm
Lead Channel Impedance Value: 893 Ohm
Lead Channel Impedance Value: 893 Ohm
Lead Channel Impedance Value: 893 Ohm
Lead Channel Impedance Value: 931 Ohm
Lead Channel Impedance Value: 950 Ohm
Lead Channel Pacing Threshold Amplitude: 1.375 V
Lead Channel Pacing Threshold Amplitude: 1.5 V
Lead Channel Pacing Threshold Pulse Width: 0.4 ms
Lead Channel Pacing Threshold Pulse Width: 1 ms
Lead Channel Sensing Intrinsic Amplitude: 10 mV
Lead Channel Sensing Intrinsic Amplitude: 10 mV
Lead Channel Setting Pacing Amplitude: 2 V
Lead Channel Setting Pacing Amplitude: 2.75 V
Lead Channel Setting Pacing Pulse Width: 0.4 ms
Lead Channel Setting Pacing Pulse Width: 1 ms
Lead Channel Setting Sensing Sensitivity: 0.3 mV
Zone Setting Status: 755011

## 2023-01-28 ENCOUNTER — Ambulatory Visit
Admission: RE | Admit: 2023-01-28 | Discharge: 2023-01-28 | Disposition: A | Discharge status: Home / Self Care | Payer: Medicare HMO | Source: Ambulatory Visit | Attending: Family Medicine | Admitting: Family Medicine

## 2023-01-28 DIAGNOSIS — Z87891 Personal history of nicotine dependence: Secondary | ICD-10-CM

## 2023-02-01 ENCOUNTER — Ambulatory Visit (INDEPENDENT_AMBULATORY_CARE_PROVIDER_SITE_OTHER): Payer: Medicare HMO | Admitting: Family Medicine

## 2023-02-01 ENCOUNTER — Encounter: Payer: Self-pay | Admitting: Family Medicine

## 2023-02-01 ENCOUNTER — Ambulatory Visit (HOSPITAL_COMMUNITY)
Admission: RE | Admit: 2023-02-01 | Discharge: 2023-02-01 | Disposition: A | Discharge status: Home / Self Care | Payer: Medicare HMO | Source: Ambulatory Visit | Attending: Cardiology | Admitting: Cardiology

## 2023-02-01 VITALS — BP 102/60 | HR 87 | Temp 97.3°F | Ht 72.0 in | Wt 186.2 lb

## 2023-02-01 DIAGNOSIS — I5022 Chronic systolic (congestive) heart failure: Secondary | ICD-10-CM | POA: Diagnosis not present

## 2023-02-01 DIAGNOSIS — I2510 Atherosclerotic heart disease of native coronary artery without angina pectoris: Secondary | ICD-10-CM | POA: Diagnosis not present

## 2023-02-01 DIAGNOSIS — I6523 Occlusion and stenosis of bilateral carotid arteries: Secondary | ICD-10-CM | POA: Diagnosis not present

## 2023-02-01 DIAGNOSIS — E119 Type 2 diabetes mellitus without complications: Secondary | ICD-10-CM

## 2023-02-01 DIAGNOSIS — I10 Essential (primary) hypertension: Secondary | ICD-10-CM

## 2023-02-01 DIAGNOSIS — J449 Chronic obstructive pulmonary disease, unspecified: Secondary | ICD-10-CM | POA: Diagnosis not present

## 2023-02-01 DIAGNOSIS — I2782 Chronic pulmonary embolism: Secondary | ICD-10-CM | POA: Diagnosis not present

## 2023-02-01 DIAGNOSIS — I482 Chronic atrial fibrillation, unspecified: Secondary | ICD-10-CM | POA: Diagnosis not present

## 2023-02-01 LAB — COMPREHENSIVE METABOLIC PANEL
ALT: 18 U/L (ref 0–53)
AST: 18 U/L (ref 0–37)
Albumin: 4.1 g/dL (ref 3.5–5.2)
Alkaline Phosphatase: 59 U/L (ref 39–117)
BUN: 20 mg/dL (ref 6–23)
CO2: 31 mEq/L (ref 19–32)
Calcium: 10 mg/dL (ref 8.4–10.5)
Chloride: 102 mEq/L (ref 96–112)
Creatinine, Ser: 0.83 mg/dL (ref 0.40–1.50)
GFR: 83.3 mL/min (ref >60.00)
Glucose, Bld: 121 mg/dL — ABNORMAL HIGH (ref 70–99)
Potassium: 4.5 mEq/L (ref 3.5–5.1)
Sodium: 140 mEq/L (ref 135–145)
Total Bilirubin: 1 mg/dL (ref 0.2–1.2)
Total Protein: 6.8 g/dL (ref 6.0–8.3)

## 2023-02-01 LAB — HEMOGLOBIN A1C: Hgb A1c MFr Bld: 6.6 % — ABNORMAL HIGH (ref 4.6–6.5)

## 2023-02-01 NOTE — Patient Instructions (Addendum)
Have diabetic eye exam faxed to [place] at the time of your visit [phone].  Please stop by lab before you go If you have mychart- we will send your results within 3 business days of [place] receiving them.  If you do not have mychart- we will call you about results within 5 business days of [place] receiving them.  *please also note that you will see labs on mychart as soon as they post. I will later go in and write notes on them- will say "notes from Dr. Yong Channel"   You are eligible to schedule your annual wellness visit with our nurse specialist Otila Kluver.  Please consider scheduling this before you leave today   Recommended follow up: Return in about 18 weeks (around 06/07/2023) for physical or sooner if needed.Schedule b4 you leave.

## 2023-02-01 NOTE — Progress Notes (Signed)
Phone [phone] In person visit   Subjective:   Carl Wood is a 82 y.o. year old very pleasant adult patient who presents for/with See problem oriented charting Chief Complaint  Patient presents with   Follow-up    (No mask)   Hypertension   Diabetes    Past Medical History-  Patient Active Problem List   Diagnosis Date Noted   COPD (chronic obstructive pulmonary disease) ([place]) 12/26/2018    Priority: High   History of subdural hematoma 11/21/2017    Priority: High   Chronic atrial fibrillation ([place])     Priority: High   Chronic systolic heart failure ([place]) 08/06/2015    Priority: High   Former smoker 11/15/2014    Priority: High   History of cardioembolic cerebrovascular accident (CVA) 12/19/2012    Priority: High    ventricular tachycardia-non sustained  02/17/2012    Priority: High   Diabetes mellitus type II, controlled ([place]) 10/16/2006    Priority: High   CAD (coronary artery disease) 10/16/2006    Priority: High   Chronic pulmonary embolism ([place]) 10/16/2006    Priority: High   Dyspnea on exertion 08/22/2017    Priority: Medium    OSA (obstructive sleep apnea) 02/19/2016    Priority: Medium    Carotid artery stenosis 03/17/2015    Priority: Medium    Implantable cardioverter-defibrillator (ICD) in situ 02/17/2012    Priority: Medium    History of skin cancer 07/05/2007    Priority: Medium    Hyperlipidemia associated with type 2 diabetes mellitus ([place]) 10/16/2006    Priority: Medium    Essential hypertension 10/16/2006    Priority: Medium    Osteoarthritis of right knee 09/22/2018    Priority: Low   Frontal headache 10/06/2017    Priority: Low   Rhinitis 08/22/2017    Priority: Low   LBBB (left bundle branch block) 08/06/2015    Priority: Low   Actinic keratosis 11/15/2014    Priority: Low   Benign neoplasm of colon 12/11/2012    Priority: Low   Ischemic cardiomyopathy 03/28/2017    Medications- reviewed and updated Current  Outpatient Medications  Medication Sig Dispense Refill   Ascorbic Acid (VITAMIN C ER PO) Take 1 tablet by mouth daily.     blood glucose meter kit and supplies KIT Dispense based on patient and insurance preference. Use up to four times daily as directed. 1 each 0   carvedilol (COREG) 6.25 MG tablet TAKE 1 TABLET BY MOUTH TWICE DAILY WITH A MEAL 60 tablet 0   cetirizine (ZYRTEC) 10 MG tablet Take 10 mg by mouth daily.     digoxin (LANOXIN) 0.25 MG tablet Take 1/2 (one-half) tablet by mouth once daily 45 tablet 3   furosemide (LASIX) 20 MG tablet Take 1 tablet by mouth once a week 12 tablet 0   glimepiride (AMARYL) 4 MG tablet TAKE 2 TABLETS BY MOUTH ONCE DAILY WITH BREAKFAST 180 tablet 0   glucose blood (ACCU-CHEK GUIDE) test strip USE UP TO FOUR TIMES DAILY Dx: E11.9 450 each 3   JARDIANCE 10 MG TABS tablet Take 1 tablet by mouth once daily 30 tablet 0   metFORMIN (GLUCOPHAGE-XR) 500 MG 24 hr tablet TAKE 2 TABLETS BY MOUTH IN THE MORNING AND 2 AT BEDTIME 360 tablet 0   rosuvastatin (CRESTOR) 40 MG tablet Take 1 tablet by mouth once daily 90 tablet 0   sacubitril-valsartan (ENTRESTO) 24-26 MG Take 1 tablet by mouth 2 (two) times daily. 180 tablet 3  XARELTO 20 MG TABS tablet Take 1 tablet by mouth once daily 30 tablet 11   No current facility-administered medications for this visit.     Objective:  BP 102/60   Pulse 87   Temp (!) 97.3 F (36.3 C)   Ht 6' (1.829 m)   Wt 186 lb 3.2 oz (84.5 kg)   SpO2 95%   BMI 25.25 kg/m  Gen: NAD, resting comfortably CV: RRR no murmurs rubs or gallops Lungs: CTAB no crackles, wheeze, rhonchi Ext: no edema Skin: warm, dry     Assessment and Plan   #Chronic systolic heart failure-follows with Dr. Haroldine Laws #ICD in place with history of ventricular tachycardia S: Compliant with carvedilol 6.25 mg twice a day, digoxin 0.'125mg'$ , Lasix 20 mg-once a week still, Entresto24-26 mg BID.  Also on Jardiance for diabetes which is favorable for heart  failure patients -ICD monitored by cardiology -no chest pain or shortness of breath. No increased edema or weight gain.  A/P: heart failure appears stable- continue current medications     # Atrial fibrillation chronic/ history of cardioemolic CVA/history subdural hematoma #chronic PE S: Compliant with carvedilol 3.125  mg twice a day for rate control and Xarelto 20 mg for anticoagulation. -Does have history of subdural hematoma-Xarelto was held short-term but was restarted once resolved -History of cardioembolic strokes we prefer to have him on Xarelto as long as no falls A/P: appropriately anticoagulated and rate controlled- continue current medicine (also anticoagulation covers chronic PE)    #CAD/hyperlipidemia/carotid artery stenosis S: History of MI in 1983.  Denies history of stents or bypass-streptokinase was used in the past per patient. -Due to bleeding risk is on Xarelto alone and not on aspirin -Patient compliant with rosuvastatin 40 mg daily.   - no chest pain or shortness of breath as above Lab Results  Component Value Date   CHOL 118 03/29/2022   HDL 37.10 (L) 03/29/2022   LDLCALC 44 03/29/2022   LDLDIRECT 77 11/06/2020   TRIG 185.0 (H) 03/29/2022   CHOLHDL 3 03/29/2022  A/P: CAD asymptomatic - continue current medications Lipids at goal- not due for repeat yet  -Carotid artery stenosis - scheduled at 1 pm today  #COPD S: no rx right now- denies significant shortness of breath but is limited at baseline with CHF -symbicort did not make a big difference in breathing A/P: stable without meds- continue to monitor   # Diabetes S: Compliant with metformin '1000mg'$  BID ER , jardiance '10mg'$  (q2 hour urination so wants to avoid increase ), amaryl 8 mg CBGs- slightly worse after steroid injection but came back down after 30 point spike Lab Results  Component Value Date   HGBA1C 6.3 10/01/2022   HGBA1C 8.2 (H) 06/02/2022   HGBA1C 7.9 (H) 03/29/2022  A/P: he thinks a1c may  be up some- we are still hoping for under 8- do understand that steroid shot may have increased this  #Hypertension S: Compliant with carvedilol, Entresto, Lasix.  Blood pressure traditionally runs very low but these medications have been required due to CHF  A/P: low normal but tolerates and needs meds for CHF_ continue current medications    #OSA-compliant with CPAP  Recommended follow up: Return in about 18 weeks (around 06/07/2023) for physical or sooner if needed.Schedule b4 you leave. Future Appointments  Date Time Provider [place]  02/01/2023  1:00 PM [facility] NL VASC 1 [facility]  04/26/2023  7:20 AM [facility] DEVICE REMOTES [facility]  05/16/2023  3:15 PM Virl Axe  C, MD [facility]  07/26/2023  7:20 AM [facility] DEVICE REMOTES [facility]    Lab/Order associations:   ICD-10-CM   1. Chronic systolic heart failure (HCC)  I50.22     2. Controlled type 2 diabetes mellitus without complication, without long-term current use of insulin (HCC)  E11.9 Comprehensive metabolic panel    Hemoglobin A1c    3. Other chronic pulmonary embolism without acute cor pulmonale (HCC) Chronic I27.82     4. Chronic obstructive pulmonary disease, unspecified COPD type (HCC) Chronic J44.9     5. Chronic atrial fibrillation (HCC)  I48.20     6. Coronary artery disease involving native coronary artery of native heart without angina pectoris  I25.10     7. Essential hypertension  I10       No orders of the defined types were placed in this encounter.   Return precautions advised.  [name], MD

## 2023-02-06 ENCOUNTER — Other Ambulatory Visit (HOSPITAL_COMMUNITY): Payer: Self-pay | Admitting: Internal Medicine

## 2023-02-06 ENCOUNTER — Other Ambulatory Visit: Payer: Self-pay | Admitting: Family Medicine

## 2023-02-25 NOTE — Progress Notes (Signed)
Remote ICD transmission.   

## 2023-03-07 ENCOUNTER — Other Ambulatory Visit: Payer: Self-pay | Admitting: Family Medicine

## 2023-03-07 ENCOUNTER — Other Ambulatory Visit (HOSPITAL_COMMUNITY): Payer: Self-pay | Admitting: Internal Medicine

## 2023-03-19 ENCOUNTER — Other Ambulatory Visit: Payer: Self-pay | Admitting: Family Medicine

## 2023-03-21 DIAGNOSIS — L298 Other pruritus: Secondary | ICD-10-CM | POA: Diagnosis not present

## 2023-03-21 DIAGNOSIS — L821 Other seborrheic keratosis: Secondary | ICD-10-CM | POA: Diagnosis not present

## 2023-03-21 DIAGNOSIS — L814 Other melanin hyperpigmentation: Secondary | ICD-10-CM | POA: Diagnosis not present

## 2023-03-21 DIAGNOSIS — D225 Melanocytic nevi of trunk: Secondary | ICD-10-CM | POA: Diagnosis not present

## 2023-03-21 DIAGNOSIS — L820 Inflamed seborrheic keratosis: Secondary | ICD-10-CM | POA: Diagnosis not present

## 2023-03-21 DIAGNOSIS — L570 Actinic keratosis: Secondary | ICD-10-CM | POA: Diagnosis not present

## 2023-03-24 ENCOUNTER — Other Ambulatory Visit (HOSPITAL_COMMUNITY): Payer: Self-pay

## 2023-03-24 ENCOUNTER — Telehealth (HOSPITAL_COMMUNITY): Payer: Self-pay

## 2023-03-24 NOTE — Telephone Encounter (Signed)
Patient called for samples of Xarelto 20mg  as he is in the donut hole.  Medication Samples have been provided to the patient.  Drug name: Xarelto       Strength: 20mg         Qty: 4  LOT: [id number]  Exp.Date: 10/24  Dosing instructions: Take 1 tablet daily  The patient has been instructed regarding the correct time, dose, and frequency of taking this medication, including desired effects and most common side effects.   Carl Wood 1:36 PM 03/24/2023

## 2023-04-03 ENCOUNTER — Other Ambulatory Visit: Payer: Self-pay | Admitting: Family Medicine

## 2023-04-08 ENCOUNTER — Telehealth (HOSPITAL_COMMUNITY): Payer: Self-pay | Admitting: Pharmacy Technician

## 2023-04-08 NOTE — Telephone Encounter (Signed)
Advanced Heart Failure Patient Advocate Encounter  The patient was approved for a [employer] grant that will help cover the cost of Jardiance, Entresto and Coreg. Total amount awarded, $10,000. Eligibility, 05/06/23 - 05/04/24.  ID [id number]  BIN [id number]  PCN [id number]  Group [id number]

## 2023-04-25 ENCOUNTER — Telehealth (HOSPITAL_COMMUNITY): Payer: Self-pay

## 2023-04-25 NOTE — Telephone Encounter (Signed)
Ok to take Tumeric.    Thanks,  Nesanel Aguila NP-C

## 2023-04-25 NOTE — Telephone Encounter (Signed)
Patient called and asked if ok to take Turmeric for his arthritis with his other medications and condition.  Please advise

## 2023-04-25 NOTE — Telephone Encounter (Signed)
I spoke to patient and updated status.

## 2023-04-26 ENCOUNTER — Ambulatory Visit (INDEPENDENT_AMBULATORY_CARE_PROVIDER_SITE_OTHER): Payer: Medicare HMO

## 2023-04-26 DIAGNOSIS — I472 Ventricular tachycardia, unspecified: Secondary | ICD-10-CM

## 2023-04-26 DIAGNOSIS — I255 Ischemic cardiomyopathy: Secondary | ICD-10-CM

## 2023-04-26 LAB — CUP PACEART REMOTE DEVICE CHECK
Battery Remaining Longevity: 7 mo
Battery Voltage: 2.84 V
Brady Statistic AP VP Percent: 0 %
Brady Statistic AP VS Percent: 0 %
Brady Statistic AS VP Percent: 0 %
Brady Statistic AS VS Percent: 0 %
Brady Statistic RA Percent Paced: 0 %
Brady Statistic RV Percent Paced: 96.21 %
Date Time Interrogation Session: 20240521022702
HighPow Impedance: 75 Ohm
Implantable Lead Connection Status: 753985
Implantable Lead Connection Status: 753985
Implantable Lead Implant Date: 20010723
Implantable Lead Implant Date: 20180423
Implantable Lead Location: 753858
Implantable Lead Location: 753860
Implantable Lead Model: 6943
Implantable Pulse Generator Implant Date: 20180423
Lead Channel Impedance Value: 1007 Ohm
Lead Channel Impedance Value: 261.164
Lead Channel Impedance Value: 261.164
Lead Channel Impedance Value: 266 Ohm
Lead Channel Impedance Value: 274.19 Ohm
Lead Channel Impedance Value: 279.525
Lead Channel Impedance Value: 304 Ohm
Lead Channel Impedance Value: 399 Ohm
Lead Channel Impedance Value: 4047 Ohm
Lead Channel Impedance Value: 513 Ohm
Lead Channel Impedance Value: 532 Ohm
Lead Channel Impedance Value: 532 Ohm
Lead Channel Impedance Value: 589 Ohm
Lead Channel Impedance Value: 931 Ohm
Lead Channel Impedance Value: 931 Ohm
Lead Channel Impedance Value: 950 Ohm
Lead Channel Impedance Value: 950 Ohm
Lead Channel Impedance Value: 950 Ohm
Lead Channel Pacing Threshold Amplitude: 1.25 V
Lead Channel Pacing Threshold Amplitude: 1.5 V
Lead Channel Pacing Threshold Pulse Width: 0.4 ms
Lead Channel Pacing Threshold Pulse Width: 1 ms
Lead Channel Sensing Intrinsic Amplitude: 11.75 mV
Lead Channel Sensing Intrinsic Amplitude: 11.75 mV
Lead Channel Setting Pacing Amplitude: 2 V
Lead Channel Setting Pacing Amplitude: 2.75 V
Lead Channel Setting Pacing Pulse Width: 0.4 ms
Lead Channel Setting Pacing Pulse Width: 1 ms
Lead Channel Setting Sensing Sensitivity: 0.3 mV
Zone Setting Status: 755011

## 2023-05-03 ENCOUNTER — Other Ambulatory Visit (HOSPITAL_COMMUNITY): Payer: Self-pay | Admitting: Internal Medicine

## 2023-05-08 ENCOUNTER — Other Ambulatory Visit: Payer: Self-pay | Admitting: Family Medicine

## 2023-05-08 ENCOUNTER — Other Ambulatory Visit (HOSPITAL_COMMUNITY): Payer: Self-pay | Admitting: Internal Medicine

## 2023-05-10 DIAGNOSIS — I4821 Permanent atrial fibrillation: Secondary | ICD-10-CM | POA: Insufficient documentation

## 2023-05-10 DIAGNOSIS — I454 Nonspecific intraventricular block: Secondary | ICD-10-CM | POA: Insufficient documentation

## 2023-05-16 ENCOUNTER — Ambulatory Visit: Payer: Medicare HMO | Attending: Internal Medicine | Admitting: Internal Medicine

## 2023-05-16 ENCOUNTER — Encounter: Payer: Self-pay | Admitting: Internal Medicine

## 2023-05-16 VITALS — BP 105/67 | HR 78 | Wt 189.6 lb

## 2023-05-16 DIAGNOSIS — I255 Ischemic cardiomyopathy: Secondary | ICD-10-CM

## 2023-05-16 DIAGNOSIS — Z95810 Presence of automatic (implantable) cardiac defibrillator: Secondary | ICD-10-CM | POA: Diagnosis not present

## 2023-05-16 DIAGNOSIS — I4821 Permanent atrial fibrillation: Secondary | ICD-10-CM | POA: Diagnosis not present

## 2023-05-16 DIAGNOSIS — I454 Nonspecific intraventricular block: Secondary | ICD-10-CM

## 2023-05-16 LAB — CUP PACEART INCLINIC DEVICE CHECK
Date Time Interrogation Session: 20240610164343
Implantable Lead Connection Status: 753985
Implantable Lead Connection Status: 753985
Implantable Lead Implant Date: 20010723
Implantable Lead Implant Date: 20180423
Implantable Lead Location: 753858
Implantable Lead Location: 753860
Implantable Lead Model: 6943
Implantable Pulse Generator Implant Date: 20180423

## 2023-05-16 MED ORDER — CARVEDILOL 6.25 MG PO TABS: 3.1250 mg | ORAL_TABLET | Freq: Two times a day (BID) | ORAL | 3 refills | Status: DC

## 2023-05-16 MED ORDER — RIVAROXABAN 20 MG PO TABS: 20.0000 mg | ORAL_TABLET | Freq: Every day | ORAL | 3 refills | Status: DC

## 2023-05-16 NOTE — Progress Notes (Signed)
Patient Care Team: Shelva Majestic, MD as PCP - General (Family Medicine) Quintella Reichert, MD as PCP - Sleep Medicine (Cardiology) Bensimhon, Bevelyn Buckles, MD as PCP - Advanced Heart Failure (Cardiology) Duke Salvia, MD as Consulting Physician (Cardiology) Bevelyn Ngo, NP as Nurse Practitioner (Pulmonary Disease) Drema Dallas, DO as Consulting Physician (Neurology) Pa, [facility] as Consulting Physician (Ophthalmology) Wynona Canes, MD as Consulting Physician (Dermatology)   HPI  CC  Ventricular tachycardia and CAD  Carl Wood is a 82 y.o. adult is seen in followup for ischemic heart disease with ICD implanted for ventricular tachycardia. He has history of bypass grafting and depressed left ventricular function. He has a history of recurrent ventricular tachycardia--most recently polymorphic in september 2021  associated with low-normal magnesium.     Permanent atrial fibrillation.  Anticoagulated with Xarelto.  12/18 evaluation for chronic headaches showed bilateral small subdurals.  Xarelto was stopped -- subdurals resolved and Xarelto resumed. no bleeding  CRT upgrade 4/18.--He had an IVCD but anterior precordial notching.   The patient denies chest pain, nocturnal dyspnea, orthopnea or peripheral edema.  There have been no palpitations or syncope.  Complains of fatigue and weakness.  Back pain precludes prolonged standing but also gets weakness in his legs that needs to sit down.  This seems to encroach upon exercise intolerance before anything else although he does have some dyspnea with modest heavy exertion like walking up an incline.     DATE TEST EF   7/16 Echo    20 %   2/17 Cath   LAD disease  Nonviable ant wall   10/17 Echo   20-25%   8/19 Echo  20%   1/21 Echo  20-25%   2/23 Echo   25-30%      Date Cr K Hgb Dig  1/21 1.07 4.0 16.2 0.5  4/22  0.98 4.1 16.1 0.5  }4/23 1.08 4.1 15.4 0.3 (2/23)  2/24 0.83 4.5 16.5 0.2       Thromboembolic risk factors ( age -80 , HTN-1, TIA/CVA-2, DM-1, Vasc disease -1, CHF-1) for a CHADSVASc Score of >=8  Past Medical History:  Diagnosis Date   CAD (coronary artery disease)    CHF (congestive heart failure) (HCC)    Chronic atrial fibrillation (HCC)    Colon polyps    Diabetes mellitus    Diverticulosis of colon    DIVERTICULOSIS, COLON 10/16/2006   Qualifier: Diagnosis of  By: Cato Mulligan MD, Bruce     Excessive daytime sleepiness 02/19/2016   Hyperlipidemia    Hypertension    MI (mitral incompetence)    Nephrolithiasis    NEPHROLITHIASIS 06/26/2008   Qualifier: Diagnosis of  By: Linna Darner, CMA, Cindy     OSA (obstructive sleep apnea) 02/19/2016   Moderate to severe OSA with an AHI of 25/hr and on CPAP at 8cm H2O   PE (pulmonary embolism)    [facility])     Past Surgical History:  Procedure Laterality Date   BACK SURGERY     BASAL CELL CARCINOMA EXCISION     nose   BIV UPGRADE N/A 03/28/2017   Procedure: BiV Upgrade;  Surgeon: Duke Salvia, MD;  [facility];  Service: Cardiovascular;  Laterality: N/A;   CARDIAC CATHETERIZATION N/A 01/20/2016   Procedure: Right/Left Heart Cath and Coronary Angiography;  Surgeon: Dolores Patty, MD;  Location[facility] INVASIVE CV LAB;  Service: Cardiovascular;  Laterality: N/A;  CARDIAC DEFIBRILLATOR PLACEMENT     medtronic virtuoso   CHOLECYSTECTOMY     COLONOSCOPY  12/11/2012   Procedure: COLONOSCOPY;  Surgeon: Louis Meckel, MD;  Location: [facility] ENDOSCOPY;  Service: Endoscopy;  Laterality: N/A;   KNEE ARTHROPLASTY      Current Outpatient Medications  Medication Sig Dispense Refill   Ascorbic Acid (VITAMIN C ER PO) Take 1 tablet by mouth daily.     blood glucose meter kit and supplies KIT Dispense based on patient and insurance preference. Use up to four times daily as directed. 1 each 0   carvedilol (COREG) 6.25 MG tablet TAKE 1 TABLET BY MOUTH TWICE DAILY WITH A MEAL 180 tablet 0   cetirizine (ZYRTEC) 10 MG tablet  Take 10 mg by mouth daily.     digoxin (LANOXIN) 0.25 MG tablet Take 1/2 (one-half) tablet by mouth once daily 45 tablet 3   furosemide (LASIX) 20 MG tablet Take 1 tablet by mouth once a week 12 tablet 0   glimepiride (AMARYL) 4 MG tablet TAKE 2 TABLETS BY MOUTH ONCE DAILY WITH BREAKFAST 180 tablet 0   glucose blood (ACCU-CHEK GUIDE) test strip USE UP TO FOUR TIMES DAILY Dx: E11.9 450 each 3   JARDIANCE 10 MG TABS tablet Take 1 tablet by mouth once daily 30 tablet 0   metFORMIN (GLUCOPHAGE-XR) 500 MG 24 hr tablet TAKE 2 TABLETS BY MOUTH IN THE MORNING AND 2 AT BEDTIME 360 tablet 0   rosuvastatin (CRESTOR) 40 MG tablet Take 1 tablet by mouth once daily 90 tablet 0   sacubitril-valsartan (ENTRESTO) 24-26 MG Take 1 tablet by mouth 2 (two) times daily. 180 tablet 3   Turmeric 500 MG CAPS Take by mouth. 1 daily     XARELTO 20 MG TABS tablet Take 1 tablet by mouth once daily 30 tablet 0   No current facility-administered medications for this visit.    Allergies  Allergen Reactions   Penicillins Other (See Comments)    Patient passed out   Sulfamethoxazole Rash    Review of Systems negative except from HPI and PMH  Physical Exam BP 124/70   Pulse 84   Wt 189 lb 9.6 oz (86 kg)   BMI 25.71 kg/m  Well developed and well nourished in no acute distress HENT normal Neck supple with JVP-flat Clear Device pocket well healed; without hematoma or erythema.  There is no tethering  Regular rate and rhythm, no murmur Abd-soft with active BS No Clubbing cyanosis  edema Skin-warm and dry A & Oriented  Grossly normal sensory and motor function  ECG afib w V pacing 84 -/15/40  Device function is normal. Programming changes none, alert tolerance demonstrated See Paceart for details    Assessment and  Plan  Atrial fibrillation-permanent  IVCD  Ischemic cardiomyopathy prior bypass surgery  ICD-Medtronic-CRT    Weakness    Patient has weakness with standing.  Question orthostatic  hypotension.  But mostly I am impressed by low blood pressure 90s-100s.  Will decrease the carvedilol from 6.25--3.125 twice daily.  Will arrange for follow-up with EP team in 6-8 weeks.  If her blood pressure is still less than 110 and still with the same symptoms, would have him discontinue the Entresto and start him on an ARB losartan or valsartan  No bleeding.  Continue anticoagulation.  Denies symptoms of ischemia.  Continue furosemide for his cardiomyopathy carvedilol and add Lanoxin On anticoagulation with warfarin without clinical bleeding hemoglobin stable  No symptoms of angina.  Continue  carvedilol.  Euvolemic.  We will continue furosemide 20 mg once a week.  With his cardiomyopathy, continue carvedilol 3.125 twice daily and Lanoxin 0.125.

## 2023-05-16 NOTE — Patient Instructions (Addendum)
Medication Instructions:  Your physician has recommended you make the following change in your medication:   ** Decrease Carvedilol 6.25mg  to 1/2 tablet (3.125mg ) by mouth twice daily.  *If you need a refill on your cardiac medications before your next appointment, please call your pharmacy*   Lab Work: None ordered.  If you have labs (blood work) drawn today and your tests are completely normal, you will receive your results only by: MyChart Message (if you have MyChart) OR A paper copy in the mail If you have any lab test that is abnormal or we need to change your treatment, we will call you to review the results.   Testing/Procedures: None ordered.    Follow-Up: At [facility], you and your health needs are our priority.  As part of our continuing mission to provide you with exceptional heart care, we have created designated Provider Care Teams.  These Care Teams include your primary Cardiologist (physician) and Advanced Practice Providers (APPs -  Physician Assistants and Nurse Practitioners) who all work together to provide you with the care you need, when you need it.  We recommend signing up for the patient portal called "MyChart".  Sign up information is provided on this After Visit Summary.  MyChart is used to connect with patients for Virtual Visits (Telemedicine).  Patients are able to view lab/test results, encounter notes, upcoming appointments, etc.  Non-urgent messages can be sent to your provider as well.   To learn more about what you can do with MyChart, go to [email or web address].    Your next appointment:   6 week f/u with Dr Odessa Fleming PA Otilio Saber, PA-C  Other Instructions:  Please call [phone] for assistance with Xarelto  Please call [phone] for assistance with Entresto   1) [facility]  Website: [email or web address]   2) [facility]  Website: [email or web address]  Phone: [phone]  Fax: [phone]  Email:  [email or web address]    We can only provide the prescription.  We are not able to fax or email this out of the country

## 2023-05-19 ENCOUNTER — Telehealth: Payer: Self-pay | Admitting: Internal Medicine

## 2023-05-19 NOTE — Telephone Encounter (Signed)
Spoke with pt and advised per Dr Graciela Husbands to stop Carvedilol.  Pt verbalizes understanding and will keep follow up appointment with PA-C as scheduled.

## 2023-05-19 NOTE — Telephone Encounter (Signed)
Pt c/o medication issue:  1. Name of Medication:   carvedilol (COREG) 6.25 MG tablet    2. How are you currently taking this medication (dosage and times per day)?  Take 0.5 tablets (3.125 mg total) by mouth 2 (two) times daily with a meal.       3. Are you having a reaction (difficulty breathing--STAT)? No  4. What is your medication issue? Pt is requesting a callback from [facility] to see if he should stop taking this medication. Please advise

## 2023-05-20 NOTE — Progress Notes (Signed)
Remote ICD transmission.   

## 2023-05-23 DIAGNOSIS — H524 Presbyopia: Secondary | ICD-10-CM | POA: Diagnosis not present

## 2023-05-29 ENCOUNTER — Other Ambulatory Visit: Payer: Self-pay | Admitting: Internal Medicine

## 2023-05-29 ENCOUNTER — Other Ambulatory Visit (HOSPITAL_COMMUNITY): Payer: Self-pay | Admitting: Internal Medicine

## 2023-05-29 DIAGNOSIS — I4821 Permanent atrial fibrillation: Secondary | ICD-10-CM

## 2023-05-30 ENCOUNTER — Other Ambulatory Visit: Payer: Self-pay

## 2023-05-30 MED ORDER — DIGOXIN 250 MCG PO TABS: 0.1250 mg | ORAL_TABLET | Freq: Every day | ORAL | 3 refills | Status: DC

## 2023-05-30 NOTE — Telephone Encounter (Signed)
Xarelto 20mg  refill request received. Pt is 82 years old, weight-86kg, Crea-0.83 on 02/01/23, last seen by [name] on 05/16/23, Diagnosis-Afib, CrCl-88.6 mL/min; Dose is appropriate based on dosing criteria. Will send in refill to requested pharmacy.

## 2023-06-05 ENCOUNTER — Other Ambulatory Visit: Payer: Self-pay | Admitting: Family Medicine

## 2023-06-07 ENCOUNTER — Ambulatory Visit (INDEPENDENT_AMBULATORY_CARE_PROVIDER_SITE_OTHER): Payer: Medicare HMO | Admitting: Family Medicine

## 2023-06-07 ENCOUNTER — Encounter: Payer: Self-pay | Admitting: Family Medicine

## 2023-06-07 VITALS — BP 102/60 | HR 90 | Temp 97.9°F | Ht 72.0 in | Wt 190.6 lb

## 2023-06-07 DIAGNOSIS — E785 Hyperlipidemia, unspecified: Secondary | ICD-10-CM

## 2023-06-07 DIAGNOSIS — E1169 Type 2 diabetes mellitus with other specified complication: Secondary | ICD-10-CM | POA: Diagnosis not present

## 2023-06-07 DIAGNOSIS — Z87891 Personal history of nicotine dependence: Secondary | ICD-10-CM | POA: Diagnosis not present

## 2023-06-07 DIAGNOSIS — Z7984 Long term (current) use of oral hypoglycemic drugs: Secondary | ICD-10-CM | POA: Diagnosis not present

## 2023-06-07 DIAGNOSIS — J449 Chronic obstructive pulmonary disease, unspecified: Secondary | ICD-10-CM | POA: Diagnosis not present

## 2023-06-07 DIAGNOSIS — Z0000 Encounter for general adult medical examination without abnormal findings: Secondary | ICD-10-CM | POA: Diagnosis not present

## 2023-06-07 DIAGNOSIS — I2782 Chronic pulmonary embolism: Secondary | ICD-10-CM

## 2023-06-07 DIAGNOSIS — I482 Chronic atrial fibrillation, unspecified: Secondary | ICD-10-CM | POA: Diagnosis not present

## 2023-06-07 DIAGNOSIS — I2510 Atherosclerotic heart disease of native coronary artery without angina pectoris: Secondary | ICD-10-CM | POA: Diagnosis not present

## 2023-06-07 DIAGNOSIS — E119 Type 2 diabetes mellitus without complications: Secondary | ICD-10-CM

## 2023-06-07 LAB — LIPID PANEL
Cholesterol: 138 mg/dL (ref 0–200)
HDL: 46.1 mg/dL (ref >39.00)
LDL Cholesterol: 78 mg/dL (ref 0–99)
NonHDL: 92.39
Total CHOL/HDL Ratio: 3
Triglycerides: 74 mg/dL (ref 0.0–149.0)
VLDL: 14.8 mg/dL (ref 0.0–40.0)

## 2023-06-07 LAB — URINALYSIS, ROUTINE W REFLEX MICROSCOPIC
Bilirubin Urine: NEGATIVE
Hgb urine dipstick: NEGATIVE
Ketones, ur: NEGATIVE
Leukocytes,Ua: NEGATIVE
Nitrite: NEGATIVE
RBC / HPF: NONE SEEN (ref 0–?)
Specific Gravity, Urine: 1.02 (ref 1.000–1.030)
Total Protein, Urine: NEGATIVE
Urine Glucose: >=1000 — AB
Urobilinogen, UA: 0.2 (ref 0.0–1.0)
pH: 6 (ref 5.0–8.0)

## 2023-06-07 LAB — CBC WITH DIFFERENTIAL/PLATELET
Basophils Absolute: 0 10*3/uL (ref 0.0–0.1)
Basophils Relative: 0.8 % (ref 0.0–3.0)
Eosinophils Absolute: 0.2 10*3/uL (ref 0.0–0.7)
Eosinophils Relative: 4.2 % (ref 0.0–5.0)
HCT: 48.1 % (ref 39.0–52.0)
Hemoglobin: 15.8 g/dL (ref 13.0–17.0)
Lymphocytes Relative: 20.5 % (ref 12.0–46.0)
Lymphs Abs: 1.2 10*3/uL (ref 0.7–4.0)
MCHC: 32.9 g/dL (ref 30.0–36.0)
MCV: 100.4 fl — ABNORMAL HIGH (ref 78.0–100.0)
Monocytes Absolute: 0.7 10*3/uL (ref 0.1–1.0)
Monocytes Relative: 12.2 % — ABNORMAL HIGH (ref 3.0–12.0)
Neutro Abs: 3.7 10*3/uL (ref 1.4–7.7)
Neutrophils Relative %: 62.3 % (ref 43.0–77.0)
Platelets: 188 10*3/uL (ref 150.0–400.0)
RBC: 4.79 Mil/uL (ref 4.22–5.81)
RDW: 13.5 % (ref 11.5–15.5)
WBC: 6 10*3/uL (ref 4.0–10.5)

## 2023-06-07 LAB — HEMOGLOBIN A1C: Hgb A1c MFr Bld: 7.1 % — ABNORMAL HIGH (ref 4.6–6.5)

## 2023-06-07 LAB — MICROALBUMIN / CREATININE URINE RATIO
Creatinine,U: 58.8 mg/dL
Microalb Creat Ratio: 2.5 mg/g (ref 0.0–30.0)
Microalb, Ur: 1.5 mg/dL (ref 0.0–1.9)

## 2023-06-07 LAB — COMPREHENSIVE METABOLIC PANEL
ALT: 20 U/L (ref 0–53)
AST: 18 U/L (ref 0–37)
Albumin: 4.3 g/dL (ref 3.5–5.2)
Alkaline Phosphatase: 70 U/L (ref 39–117)
BUN: 20 mg/dL (ref 6–23)
CO2: 29 mEq/L (ref 19–32)
Calcium: 10.2 mg/dL (ref 8.4–10.5)
Chloride: 103 mEq/L (ref 96–112)
Creatinine, Ser: 0.86 mg/dL (ref 0.40–1.50)
GFR: 82.21 mL/min (ref >60.00)
Glucose, Bld: 173 mg/dL — ABNORMAL HIGH (ref 70–99)
Potassium: 5.1 mEq/L (ref 3.5–5.1)
Sodium: 143 mEq/L (ref 135–145)
Total Bilirubin: 0.8 mg/dL (ref 0.2–1.2)
Total Protein: 7.1 g/dL (ref 6.0–8.3)

## 2023-06-07 NOTE — Progress Notes (Signed)
Phone: [phone]   Subjective:  Patient presents today for their annual physical. Chief complaint-noted.   See problem oriented charting- ROS- full  review of systems was completed and negative  Per full ROS sheet completed by patient  The following were reviewed and entered/updated in epic: Past Medical History:  Diagnosis Date   CAD (coronary artery disease)    CHF (congestive heart failure) (HCC)    Chronic atrial fibrillation (HCC)    Colon polyps    Diabetes mellitus    Diverticulosis of colon    DIVERTICULOSIS, COLON 10/16/2006   Qualifier: Diagnosis of  By: Cato Mulligan MD, Bruce     Excessive daytime sleepiness 02/19/2016   Hyperlipidemia    Hypertension    MI (mitral incompetence)    Nephrolithiasis    NEPHROLITHIASIS 06/26/2008   Qualifier: Diagnosis of  By: Linna Darner, CMA, Cindy     OSA (obstructive sleep apnea) 02/19/2016   Moderate to severe OSA with an AHI of 25/hr and on CPAP at 8cm H2O   PE (pulmonary embolism)    V-tach [facility])    Patient Active Problem List   Diagnosis Date Noted   COPD (chronic obstructive pulmonary disease) (HCC) 12/26/2018    Priority: High   History of subdural hematoma 11/21/2017    Priority: High   Chronic atrial fibrillation (HCC)     Priority: High   Chronic systolic heart failure (HCC) 08/06/2015    Priority: High   Former smoker 11/15/2014    Priority: High   History of cardioembolic cerebrovascular accident (CVA) 12/19/2012    Priority: High    ventricular tachycardia-non sustained  02/17/2012    Priority: High   Diabetes mellitus type II, controlled (HCC) 10/16/2006    Priority: High   CAD (coronary artery disease) 10/16/2006    Priority: High   Chronic pulmonary embolism (HCC) 10/16/2006    Priority: High   Dyspnea on exertion 08/22/2017    Priority: Medium    OSA (obstructive sleep apnea) 02/19/2016    Priority: Medium    Carotid artery stenosis 03/17/2015    Priority: Medium    Implantable cardioverter-defibrillator  (ICD) in situ 02/17/2012    Priority: Medium    History of skin cancer 07/05/2007    Priority: Medium    Hyperlipidemia associated with type 2 diabetes mellitus (HCC) 10/16/2006    Priority: Medium    Essential hypertension 10/16/2006    Priority: Medium    Osteoarthritis of right knee 09/22/2018    Priority: Low   Frontal headache 10/06/2017    Priority: Low   Rhinitis 08/22/2017    Priority: Low   LBBB (left bundle branch block) 08/06/2015    Priority: Low   Actinic keratosis 11/15/2014    Priority: Low   Benign neoplasm of colon 12/11/2012    Priority: Low   Permanent atrial fibrillation (HCC) 05/10/2023   IVCD (intraventricular conduction defect) 05/10/2023   Ischemic cardiomyopathy 03/28/2017   Past Surgical History:  Procedure Laterality Date   BACK SURGERY     BASAL CELL CARCINOMA EXCISION     nose   BIV UPGRADE N/A 03/28/2017   Procedure: BiV Upgrade;  Surgeon: Duke Salvia, MD;  Location: [facility] INVASIVE CV LAB;  Service: Cardiovascular;  Laterality: N/A;   CARDIAC CATHETERIZATION N/A 01/20/2016   Procedure: Right/Left Heart Cath and Coronary Angiography;  Surgeon: Dolores Patty, MD;  Location: [facility] INVASIVE CV LAB;  Service: Cardiovascular;  Laterality: N/A;   CARDIAC DEFIBRILLATOR PLACEMENT     medtronic virtuoso  CHOLECYSTECTOMY     COLONOSCOPY  12/11/2012   Procedure: COLONOSCOPY;  Surgeon: Louis Meckel, MD;  Location: [facility] ENDOSCOPY;  Service: Endoscopy;  Laterality: N/A;   KNEE ARTHROPLASTY      Family History  Problem Relation Age of Onset   Bladder Cancer Mother    Heart disease Mother    Colon cancer Father    Diabetes Father    Heart disease Father    Heart attack Child 21   Heart attack Child 77    Medications- reviewed and updated Current Outpatient Medications  Medication Sig Dispense Refill   Ascorbic Acid (VITAMIN C ER PO) Take 1 tablet by mouth daily.     blood glucose meter kit and supplies KIT Dispense based on patient and insurance  preference. Use up to four times daily as directed. 1 each 0   cetirizine (ZYRTEC) 10 MG tablet Take 10 mg by mouth daily.     digoxin (LANOXIN) 0.25 MG tablet Take 0.5 tablets (0.125 mg total) by mouth daily. 45 tablet 3   furosemide (LASIX) 20 MG tablet Take 1 tablet by mouth once a week 12 tablet 0   glimepiride (AMARYL) 4 MG tablet TAKE 2 TABLETS BY MOUTH ONCE DAILY WITH BREAKFAST 180 tablet 0   glucose blood (ACCU-CHEK GUIDE) test strip USE UP TO FOUR TIMES DAILY Dx: E11.9 450 each 3   JARDIANCE 10 MG TABS tablet Take 1 tablet by mouth once daily 30 tablet 0   metFORMIN (GLUCOPHAGE-XR) 500 MG 24 hr tablet TAKE 2 TABLETS BY MOUTH IN THE MORNING AND 2 AT BEDTIME 360 tablet 0   rivaroxaban (XARELTO) 20 MG TABS tablet Take 1 tablet by mouth once daily 90 tablet 2   rosuvastatin (CRESTOR) 40 MG tablet Take 1 tablet by mouth once daily 90 tablet 0   sacubitril-valsartan (ENTRESTO) 24-26 MG Take 1 tablet by mouth 2 (two) times daily. 180 tablet 3   Turmeric 500 MG CAPS Take by mouth. 1 daily     No current facility-administered medications for this visit.    Allergies-reviewed and updated Allergies  Allergen Reactions   Penicillins Other (See Comments)    Patient passed out   Sulfamethoxazole Rash    Social History   Social History Narrative   Married. 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 Oropharynx normal Neck: no thyromegaly CV: RRR no murmurs rubs or gallops Lungs: CTAB no crackles, wheeze, rhonchi Abdomen: soft/nontender/nondistended/normal  bowel sounds. No rebound or guarding.  Ext: no edema Skin: warm, dry Neuro: grossly normal, moves all extremities, PERRLA, stands slowly due to prior orhthostatic symptoms- reports mid today   Assessment and Plan  82 y.o. adult presenting for annual physical.  Health Maintenance counseling: 1. Anticipatory guidance: Patient counseled regarding regular dental exams -q6 months, eye exams - we requested copy of yearly,  avoiding smoking and second hand smoke , limiting alcohol to 2 beverages per day - doesn't drink, no illicit drugs .  2. Risk factor reduction:  Advised patient of need for regular exercise and diet rich and fruits and vegetables to reduce risk of heart attack and stroke.  Exercise- tries to be active- still mows and does weed eating.  Diet/weight management-weight down 7 pounds from last year.  Wt Readings from Last 3 Encounters:  06/07/23 190 lb 9.6 oz (86.5 kg)  05/16/23 189 lb 9.6 oz (86 kg)  02/01/23 186 lb 3.2 oz (84.5 kg)  3. Immunizations/screenings/ancillary studies-discussed COVID shot once updated in the fall  Immunization History  Administered Date(s) Administered   DTaP 09/01/2011   Fluad Quad(high Dose 65+) 08/16/2019, 10/16/2020, 09/01/2021, 09/10/2022   Influenza Split 09/08/2011   Influenza Whole 08/23/2008, 09/10/2009, 09/04/2010   Influenza, High Dose Seasonal PF 09/07/2013, 09/17/2014, 09/10/2016, 08/11/2017, 09/04/2018   Influenza-Unspecified 09/03/2015   PFIZER Comirnaty(Gray Top)Covid-19 Tri-Sucrose Vaccine 09/09/2022   PFIZER(Purple Top)SARS-COV-2 Vaccination 12/21/2019, 02/08/2020, 09/30/2020, 03/10/2021   PNEUMOCOCCAL CONJUGATE-20 03/02/2021   Pfizer Covid-19 Vaccine Bivalent Booster 30yrs & up 11/02/2021   Pneumococcal Conjugate-13 11/15/2014   Pneumococcal Polysaccharide-23 09/08/2011   Respiratory Syncytial Virus Vaccine,Recomb Aduvanted(Arexvy) 09/09/2022   Tdap 11/15/2014   Zoster Recombinant(Shingrix) 05/09/2018, 08/08/2018   Zoster,  Live 02/06/2008  4. Prostate cancer screening-  past age based screening recommendations . Stable nocturia on jardiance Lab Results  Component Value Date   PSA 0.85 10/27/2012   PSA 1.08 12/10/2008   PSA 1.41 02/06/2007   5. Colon cancer screening - past age based screening recommendations in light of other medial illness- saw Dr. Myrtie Neither in past 6. Skin cancer screening- [facility] sees Dr. Wallace Cullens. advised regular sunscreen use. Denies worrisome, changing, or new skin lesions.  7. Smoking associated screening (lung cancer screening, AAA screen 65-75, UA)- former smoker- in lung cancer screening program. Check urinalysis  8. STD screening - only active with wife  Status of chronic or acute concerns   #Chronic systolic heart failure-follows with Dr. Gala Romney #ICD in place with history of ventricular tachycardia S: Compliant with carvedilol 6.25 mg twice a day--> 3.125 mg twice daily and now off with leg weakness, digoxin 0.125mg , Lasix 20 mg-once a week, Entresto24-26 mg BID.  Also on Jardiance for diabetes which is favorable for heart failure patients -ICD monitored by cardiology A/P: CHF euvolemic- they have wanted to get blood pressure up some but even with stopping coreg has not helped substantially- has upcoming visit    # Atrial fibrillation chronic/ history of cardioemolic CVA/history subdural hematoma S: Compliant with carvedilol 6.25  mg twice a day for rate control  in past- now off and Xarelto 20 mg for anticoagulation. -Does have history of subdural hematoma-Xarelto was held short-term but was restarted once resolved -History of cardioembolic strokes we prefer to have him on Xarelto as long as no falls A/P: appropriately anticoagulated and rate controlled without medications now (though reports occasional over 100 and will chat with cardiology ffo of coreg).  continue current medicine   #CAD/hyperlipidemia/carotid artery stenosis S: History of MI in 1983.  Denies history of  stents or bypass-streptokinase was used in the past per patient. -Due to bleeding risk is on Xarelto alone and not on aspirin -Patient compliant with rosuvastatin 40 mg daily.  LDL goal under 70 - consider zetia No chest pain. Stable shortness of breath  A/P: CAD asymptomatic for most part- continue current medications   -Carotid artery stenosis in 2015.  Only 1 to 39% stenosis-stable 2022- repeat 2 years  #COPD S: no rx -symbicort did not make a  big difference in breathing A/P: doing well without inhalers- continue to monitor    # Diabetes S: Compliant with metformin 1000mg  BID , jardiance 10mg  (q2 hour urination so wants to avoid increase), amaryl 8 mg -sugar 100-125 each morning Lab Results  Component Value Date   HGBA1C 6.6 (H) 02/01/2023   HGBA1C 6.3 10/01/2022   HGBA1C 8.2 (H) 06/02/2022  A/P: hopefully stable- update a1c today. Continue current meds for now. If we do reduce a medication(s)- I would focus on glimepiride first (but no low blod sugars)  #Hypertension S: Compliant with  Entresto, Lasix 20 mg weekly.  Blood pressure traditionally runs very low but these medications have been required due to CHF thoguh coreg stopped recently  A/P: low acceptable but holdin goff on adjustments unless cardiology says otherwise   #OSA-compliant with CPAP  #Former smoker-enrolled in lung cancer screening program until no longer qualifies-potentially at 82  Recommended follow up: Return in about 6 months (around 12/08/2023) for followup or sooner if needed.Schedule b4 you leave. Future Appointments  Date Time Provider Department Center  07/11/2023 11:00 AM Graciella Freer, PA-C [facility]  07/25/2023 10:10 AM [facility] E[facility] OP [facility]  07/25/2023 11:40 AM Bensimhon, Bevelyn Buckles, MD [facility] None  07/26/2023  7:20 AM [facility] DEVICE REMOTES [facility]  08/26/2023  7:05 AM [facility] DEVICE REMOTES [facility]  09/26/2023  7:20 AM  [facility] DEVICE REMOTES [facility]  10/27/2023  7:10 AM [facility] DEVICE REMOTES [facility]  11/28/2023  7:20 AM [facility] DEVICE REMOTES [facility]  12/28/2023  7:25 AM [facility] DEVICE REMOTES [facility]  01/30/2024  7:00 AM [facility] DEVICE REMOTES [facility]    Lab/Order associations: fasting   ICD-10-CM   1. Routine general medical examination at a health care facility  Z00.00     2. Controlled type 2 diabetes mellitus without complication, without long-term current use of insulin (HCC)  E11.9 Urine Microalbumin w/creat. ratio    Hemoglobin A1c    3. Other chronic pulmonary embolism without acute cor pulmonale (HCC)  I27.82     4. Coronary artery disease involving native coronary artery of native heart without angina pectoris  I25.10 Comprehensive metabolic panel    CBC with Differential/Platelet    Lipid panel    5. Chronic atrial fibrillation (HCC)  I48.20     6. Hyperlipidemia associated with type 2 diabetes mellitus (HCC)  E11.69    E78.5     7. Chronic obstructive pulmonary disease, unspecified COPD type (HCC)  J44.9     8. Former smoker  Z87.891 Urinalysis, Routine w reflex microscopic      No orders of the defined types were placed in this encounter.   Return precautions advised.  Tana Conch, MD

## 2023-06-07 NOTE — Patient Instructions (Addendum)
Let us know if you get any COVID vaccines this fall-wait for new one  Please stop by lab before you go If you have mychart- we will send your results within 3 business days of [place] receiving them.  If you do not have mychart- we will call you about results within 5 business days of [place] receiving them.  *please also note that you will see labs on mychart as soon as they post. I will later go in and write notes on them- will say "notes from Dr. Durene Cal"   Recommended follow up: Return in about 6 months (around 12/08/2023) for followup or sooner if needed.Schedule b4 you leave. As long as a1c still under 7 but you have done fantastic lately so I'm optimistic about your levels

## 2023-06-13 ENCOUNTER — Other Ambulatory Visit: Payer: Self-pay | Admitting: Family Medicine

## 2023-07-02 ENCOUNTER — Other Ambulatory Visit: Payer: Self-pay | Admitting: Family Medicine

## 2023-07-08 NOTE — Progress Notes (Unsigned)
Electrophysiology Office Note:   Date:  07/11/2023  ID:  Carl Wood, DOB [birth year], MRN [id number]  Primary Cardiologist: None Electrophysiologist: [name], MD      History of Present Illness:   Carl Wood is a 82 y.o. adult with h/o PE, OSA, DM, SDH (on Xarelto 2018, resumed post), HTN, HLD, CAD, permanent atrial fibrillation on xarelto, VT, CHF s/p BiV ICD seen for routine electrophysiology followup.   Last remote PaceArt 05/16/23 showed 95.9% BiV pacing, estimated longevity ~56months.   Last seen in EP Clinic 05/16/23 by [name], he reported weakness at that time. His SBP was 90-100 and coreg was decreased. Planned follow up for 6-8 weeks with thought if BP remains <110, to stop entresto and start on an ARB - losartan or valsartan.  The patient did not have symptoms of ischemia > lasix was continued, coreg (reduced as prior, now off BB) and lanoxin was added.   Since last being seen in our clinic the patient reports doing well. He reports his blood pressure "has always been low".  He states he felt better after starting the Entresto.  He reports he has consistently low blood pressures for many years. He states his "weakness" occurs when he is climbing a hill.  On more specific questioning he reports his "thighs give out and he gets tired".  The patient is unable to stand from seated position without pushing off with his hands on arms of chairs.  Denies specifically lightheadedness or dizziness.   He denies chest pain, palpitations, dyspnea, PND, orthopnea, nausea, vomiting, dizziness, syncope, edema, weight gain, or early satiety.   Review of systems complete and found to be negative unless listed in HPI.    EP Information / Studies Reviewed:    EKG is not ordered today. EKG from 05/16/23 reviewed which showed VP 84 bpm      ICD Interrogation-  reviewed in detail today,  See PACEART report.  Device History: Medtronic BiV ICD - initial implanted 1997 for ICM; gen  change 2001; gen change 2009, gen change 03/28/17 for VT (upgrade to BiV ICD) History of appropriate therapy: Yes History of AAD therapy:  Tikosyn in 2003- stopped ~2012    Studies: ECHO 01/2022 > LVEF 25-30%  Risk Assessment/Calculations:    CHA2DS2-VASc Score = 8   This indicates a 10.8% annual risk of stroke. The patient's score is based upon: CHF History: 1 HTN History: 1 Diabetes History: 1 Stroke History: 2 Vascular Disease History: 1 Age Score: 2 Gender Score: 0             Physical Exam:   VS:  BP 94/60   Pulse 84   Ht 6' (1.829 m)   Wt 193 lb (87.5 kg)   BMI 26.18 kg/m    Wt Readings from Last 3 Encounters:  07/11/23 193 lb (87.5 kg)  06/07/23 190 lb 9.6 oz (86.5 kg)  05/16/23 189 lb 9.6 oz (86 kg)     GEN: Well nourished, well developed in no acute distress NECK: No JVD; No carotid bruits CARDIAC: Regular rate and rhythm, no murmurs, rubs, gallops RESPIRATORY:  Clear to auscultation without rales, wheezing or rhonchi  ABDOMEN: Soft, non-tender, non-distended EXTREMITIES:  No edema; No deformity   ASSESSMENT AND PLAN:    Chronic systolic dysfunction s/p Medtronic CRT-D  ICM, CAD s/p CABG -euvolemic on exam  -stable on an appropriate medical regimen -Device stable at last check 05/16/2023 -pt does not want to stop entresto (in light of  low BP's) > states he feels better on it -encouraged pt to walk, do prior PT exercises to strengthen legs  -pt confirms he is off coreg  -check BMP > eval renal function / K+ given Entresto & soft BP's   Permanent Atrial Fibrillation  CHA2DS2Vasc 8 / 10.8% annual risk of CVA  -continue xarelto 20mg , dose reviewed (cr cl 85 based on 06/2023 labs, wt 190lbs)  Disposition:   Follow up with EP APP in 3 months to assess BP   Signed, Canary Brim, MSN, APRN, NP-C, AGACNP-BC [facility] - Electrophysiology  07/11/2023, 1:17 PM

## 2023-07-11 ENCOUNTER — Encounter: Payer: Self-pay | Admitting: Student

## 2023-07-11 ENCOUNTER — Ambulatory Visit: Payer: Medicare HMO | Attending: Student | Admitting: Pulmonary Disease

## 2023-07-11 VITALS — BP 94/60 | HR 84 | Ht 72.0 in | Wt 193.0 lb

## 2023-07-11 DIAGNOSIS — I5022 Chronic systolic (congestive) heart failure: Secondary | ICD-10-CM | POA: Diagnosis not present

## 2023-07-11 DIAGNOSIS — Z95810 Presence of automatic (implantable) cardiac defibrillator: Secondary | ICD-10-CM | POA: Diagnosis not present

## 2023-07-11 DIAGNOSIS — I10 Essential (primary) hypertension: Secondary | ICD-10-CM | POA: Diagnosis not present

## 2023-07-11 DIAGNOSIS — I255 Ischemic cardiomyopathy: Secondary | ICD-10-CM

## 2023-07-11 DIAGNOSIS — G4733 Obstructive sleep apnea (adult) (pediatric): Secondary | ICD-10-CM

## 2023-07-11 DIAGNOSIS — I4821 Permanent atrial fibrillation: Secondary | ICD-10-CM

## 2023-07-11 DIAGNOSIS — I472 Ventricular tachycardia, unspecified: Secondary | ICD-10-CM | POA: Diagnosis not present

## 2023-07-11 NOTE — Patient Instructions (Signed)
Medication Instructions:  Your physician recommends that you continue on your current medications as directed. Please refer to the Current Medication list given to you today.  *If you need a refill on your cardiac medications before your next appointment, please call your pharmacy*  Lab Work: TODAY: BMET If you have labs (blood work) drawn today and your tests are completely normal, you will receive your results only by: MyChart Message (if you have MyChart) OR A paper copy in the mail If you have any lab test that is abnormal or we need to change your treatment, we will call you to review the results.  Testing/Procedures: None ordered today.  Follow-Up: At [facility], you and your health needs are our priority.  As part of our continuing mission to provide you with exceptional heart care, we have created designated Provider Care Teams.  These Care Teams include your primary Cardiologist (physician) and Advanced Practice Providers (APPs -  Physician Assistants and Nurse Practitioners) who all work together to provide you with the care you need, when you need it.  Your next appointment:   3 month(s)  The format for your next appointment:   In Person  Provider:   You may see [name], MD or one of the following Advanced Practice Providers on your designated Care Team:   Francis Dowse, [place] [address] [place], [place] Sherie Don, NP Canary Brim, [facility]

## 2023-07-24 NOTE — Progress Notes (Addendum)
[facility] Note    Date:  07/25/2023   ID:  Carl Wood, DOB March 19, 1941, MRN [id number]  Location: Home  Provider location: [address] [place] Type of Visit: Established patient PCP:  Shelva Majestic, MD  Cardiologist:Dr Turner  Primary HF: Dr Gala Romney  EP: Dr Graciela Husbands   History of Present Illness:  Carl Wood is a 82 y/o male with CAD s/p MI 1983, ICM with chronic systolic heart failure s/p Medtronic single chamber ICD (EF 20%) CVA 2014, DM2 VT, permanent A fib on Xarelto, SDH 2019, OSA, and former smoker.   Had CRT-D upgrade in April 2018.    In 12/18 saw Dr. Everlena Cooper for chronic HA. Found to have small SDHs that were felt to be chronic after a previous fall when trimming vines. Xarelto stopped. F/u C 2/19 SDHs resolved. Xarelto restarted in 2/19  Echo 1/21   EF 20-25%  Echo 01/13/22 EF 25-30% RV ok   ABIs 2017 No PAD  Recently seen in EP clinic and SBP low so b-blocker stopped. Digoxin started. Consideration was given to stopping Entresto but patient didn't want to.  Here for f/u with his wife. Says he noticed any improvement in BP since stopping carvedilol. Says SBP runs 95-105. No dizziness or orthostasis. Main complaint is leg weakness as he stands. No falls. No CP or edema. Says overall stable. Can do ADLs without problem.   Echo today 07/25/23 limited windows. EF ~25-30%   ICD interrogation: 3 runs brief NSVT  Fluid ok Activity ~ 2hr/day. 100% biv pacing Personally reviewed    Past Medical History:  Diagnosis Date   CAD (coronary artery disease)    CHF (congestive heart failure) (HCC)    Chronic atrial fibrillation (HCC)    Colon polyps    Diabetes mellitus    Diverticulosis of colon    DIVERTICULOSIS, COLON 10/16/2006   Qualifier: Diagnosis of  By: Cato Mulligan MD, Bruce     Excessive daytime sleepiness 02/19/2016   Hyperlipidemia    Hypertension    MI (mitral incompetence)    Nephrolithiasis    NEPHROLITHIASIS 06/26/2008    Qualifier: Diagnosis of  By: Linna Darner, CMA, Cindy     OSA (obstructive sleep apnea) 02/19/2016   Moderate to severe OSA with an AHI of 25/hr and on CPAP at 8cm H2O   PE (pulmonary embolism)    V-tach [facility])    Past Surgical History:  Procedure Laterality Date   BACK SURGERY     BASAL CELL CARCINOMA EXCISION     nose   BIV UPGRADE N/A 03/28/2017   Procedure: BiV Upgrade;  Surgeon: Duke Salvia, MD;  [facility]: [facility] INVASIVE CV LAB;  Service: Cardiovascular;  Laterality: N/A;   CARDIAC CATHETERIZATION N/A 01/20/2016   Procedure: Right/Left Heart Cath and Coronary Angiography;  Surgeon: Dolores Patty, MD;  Location: [facility] INVASIVE CV LAB;  Service: Cardiovascular;  Laterality: N/A;   CARDIAC DEFIBRILLATOR PLACEMENT     medtronic virtuoso   CHOLECYSTECTOMY     COLONOSCOPY  12/11/2012   Procedure: COLONOSCOPY;  Surgeon: Louis Meckel, MD;  Location: [facility] ENDOSCOPY;  Service: Endoscopy;  Laterality: N/A;   KNEE ARTHROPLASTY       Current Outpatient Medications  Medication Sig Dispense Refill   Ascorbic Acid (VITAMIN C ER PO) Take 1 tablet by mouth daily.     blood glucose meter kit and supplies KIT Dispense based on patient and insurance preference. Use up to four times daily  as directed. 1 each 0   cetirizine (ZYRTEC) 10 MG tablet Take 10 mg by mouth daily.     digoxin (LANOXIN) 0.25 MG tablet Take 0.5 tablets (0.125 mg total) by mouth daily. 45 tablet 3   furosemide (LASIX) 20 MG tablet Take 1 tablet by mouth once a week 12 tablet 0   glimepiride (AMARYL) 4 MG tablet TAKE 2 TABLETS BY MOUTH ONCE DAILY WITH BREAKFAST 180 tablet 0   glucose blood (ACCU-CHEK GUIDE) test strip USE UP TO FOUR TIMES DAILY Dx: E11.9 450 each 3   JARDIANCE 10 MG TABS tablet Take 1 tablet by mouth once daily 30 tablet 0   metFORMIN (GLUCOPHAGE-XR) 500 MG 24 hr tablet TAKE 2 TABLETS BY MOUTH IN THE MORNING AND 2 AT BEDTIME 360 tablet 0   rivaroxaban (XARELTO) 20 MG TABS tablet Take 1 tablet by mouth once daily 90  tablet 2   rosuvastatin (CRESTOR) 40 MG tablet Take 1 tablet by mouth once daily 90 tablet 0   sacubitril-valsartan (ENTRESTO) 24-26 MG Take 1 tablet by mouth 2 (two) times daily. 180 tablet 3   Turmeric 500 MG CAPS Take by mouth. 1 daily     No current facility-administered medications for this encounter.   Facility-Administered Medications Ordered in Other Encounters  Medication Dose Route Frequency Provider Last Rate Last Admin   perflutren lipid microspheres (DEFINITY) IV suspension  1-10 mL Intravenous PRN Geniene List, Bevelyn Buckles, MD   4 mL at 07/25/23 1117    Allergies:   Penicillins and Sulfamethoxazole   Social History:  The patient  reports that he quit smoking about 10 years ago. His smoking use included cigarettes. He started smoking about 60 years ago. He has a 50 pack-year smoking history. He has never used smokeless tobacco. He reports that he does not drink alcohol and does not use drugs.   Family History:  The patient's family history includes Bladder Cancer in his mother; Colon cancer in his father; Diabetes in his father; Heart attack (age of onset: 93) in his child; Heart attack (age of onset: 38) in his child; Heart disease in his father and mother.   ROS:  Please see the history of present illness.   All other systems are personally reviewed and negative.   Vitals:   07/25/23 1142  BP: 94/60  Pulse: 82  SpO2: 95%     Exam:  General:  Elderly. No resp difficulty HEENT: normal Neck: supple. no JVD. Carotids 2+ bilat; no bruits. No lymphadenopathy or thryomegaly appreciated. Cor: PMI nondisplaced. Irregular rate & rhythm. No rubs, gallops or murmurs. Lungs: clear Abdomen: soft, nontender, nondistended. No hepatosplenomegaly. No bruits or masses. Good bowel sounds. Extremities: no cyanosis, clubbing, rash, edema Neuro: alert & orientedx3, cranial nerves grossly intact. moves all 4 extremities w/o difficulty. Affect pleasant   Recent Labs: 01/05/2023: B Natriuretic  Peptide 116.1 06/07/2023: ALT 20; Hemoglobin 15.8; Platelets 188.0 07/11/2023: BUN 21; Creatinine, Ser 1.04; Potassium 4.2; Sodium 144   Wt Readings from Last 3 Encounters:  07/25/23 87.8 kg (193 lb 9.6 oz)  07/11/23 87.5 kg (193 lb)  06/07/23 86.5 kg (190 lb 9.6 oz)    ASSESSMENT AND PLAN:  1. Chronic Systolic HF: ICM, s/p Medtronic Bi-V ICD (upgrade 4/18). Echo 09/2016 EF 25-30%. ECHO 8/19 EF 20% - Echo 12/12/19 EF 20-25% RV ok  - Stable NYHA III - BP 90s-low 100s. Well tolerated - Volume status ok on exam and ICD interrogation today - Echo  01/13/22 EF stable 25-30% RV  ok - Echo today 07/25/23 EF 25-30% Personally reviewed - Carvedilol 3.125 bid stopped recently by EP due to low BP  - Continue digoxin. Check level today - Continue Entresto to 24/26 mg BID.  - Continue Jardiance. - Start Toprol 25 qhs - No spiro with history of hyperkalemia and soft BP.  - If gets symptomatic from low BP -> change Entresto to losartan - Check labs and digoxin level   2. OSA - Continue CPAP. Follows with Dr. Mayford Knife - Follows with Dr. Mayford Knife. Downloads have looked good    3. Chronic A fib - Rate controlled. Well tolerated - Xarelto restarted after resolution of SDH. No evidence of current bleeding   4. CAD: - Has known distal LAD lesion 95% and mid LAD 40%. No benefit to revascularization given wall motion abnormality.  - No s/s angina - Continue statin. Off ASA with Xarelto  5. NSVT on ICD today - restart b-blocker - check labs  6. Leg weakness - refer to PT   Signed, [name], MD  11:55 AM  [facility] [address] [facility] [place] [address] [phone] (office) [phone] (fax)

## 2023-07-25 ENCOUNTER — Encounter (HOSPITAL_COMMUNITY): Payer: Self-pay | Admitting: Internal Medicine

## 2023-07-25 ENCOUNTER — Ambulatory Visit (HOSPITAL_COMMUNITY)
Admission: RE | Admit: 2023-07-25 | Discharge: 2023-07-25 | Disposition: A | Discharge status: Home / Self Care | Payer: Medicare HMO | Source: Ambulatory Visit | Attending: Family Medicine | Admitting: Family Medicine

## 2023-07-25 ENCOUNTER — Ambulatory Visit (HOSPITAL_BASED_OUTPATIENT_CLINIC_OR_DEPARTMENT_OTHER)
Admission: RE | Admit: 2023-07-25 | Discharge: 2023-07-25 | Disposition: A | Discharge status: Home / Self Care | Payer: Medicare HMO | Source: Ambulatory Visit | Attending: Internal Medicine | Admitting: Internal Medicine

## 2023-07-25 VITALS — BP 94/60 | HR 82 | Wt 193.6 lb

## 2023-07-25 DIAGNOSIS — G4733 Obstructive sleep apnea (adult) (pediatric): Secondary | ICD-10-CM | POA: Insufficient documentation

## 2023-07-25 DIAGNOSIS — Z7984 Long term (current) use of oral hypoglycemic drugs: Secondary | ICD-10-CM | POA: Insufficient documentation

## 2023-07-25 DIAGNOSIS — Z7901 Long term (current) use of anticoagulants: Secondary | ICD-10-CM | POA: Insufficient documentation

## 2023-07-25 DIAGNOSIS — I110 Hypertensive heart disease with heart failure: Secondary | ICD-10-CM | POA: Insufficient documentation

## 2023-07-25 DIAGNOSIS — I2510 Atherosclerotic heart disease of native coronary artery without angina pectoris: Secondary | ICD-10-CM | POA: Diagnosis not present

## 2023-07-25 DIAGNOSIS — I252 Old myocardial infarction: Secondary | ICD-10-CM | POA: Insufficient documentation

## 2023-07-25 DIAGNOSIS — I081 Rheumatic disorders of both mitral and tricuspid valves: Secondary | ICD-10-CM | POA: Diagnosis not present

## 2023-07-25 DIAGNOSIS — E785 Hyperlipidemia, unspecified: Secondary | ICD-10-CM | POA: Diagnosis not present

## 2023-07-25 DIAGNOSIS — Z95810 Presence of automatic (implantable) cardiac defibrillator: Secondary | ICD-10-CM

## 2023-07-25 DIAGNOSIS — I472 Ventricular tachycardia, unspecified: Secondary | ICD-10-CM | POA: Insufficient documentation

## 2023-07-25 DIAGNOSIS — I482 Chronic atrial fibrillation, unspecified: Secondary | ICD-10-CM | POA: Diagnosis not present

## 2023-07-25 DIAGNOSIS — I5022 Chronic systolic (congestive) heart failure: Secondary | ICD-10-CM | POA: Insufficient documentation

## 2023-07-25 DIAGNOSIS — E119 Type 2 diabetes mellitus without complications: Secondary | ICD-10-CM | POA: Insufficient documentation

## 2023-07-25 DIAGNOSIS — Z79899 Other long term (current) drug therapy: Secondary | ICD-10-CM | POA: Diagnosis not present

## 2023-07-25 LAB — BASIC METABOLIC PANEL
Anion gap: 9 (ref 5–15)
BUN: 19 mg/dL (ref 8–23)
CO2: 28 mmol/L (ref 22–32)
Calcium: 9.5 mg/dL (ref 8.9–10.3)
Chloride: 103 mmol/L (ref 98–111)
Creatinine, Ser: 1.05 mg/dL (ref 0.61–1.24)
GFR, Estimated: >60 mL/min (ref >60)
Glucose, Bld: 222 mg/dL — ABNORMAL HIGH (ref 70–99)
Potassium: 4 mmol/L (ref 3.5–5.1)
Sodium: 140 mmol/L (ref 135–145)

## 2023-07-25 LAB — ECHOCARDIOGRAM COMPLETE
Calc EF: 30 %
S' Lateral: 4.4 cm
Single Plane A2C EF: 27.3 %
Single Plane A4C EF: 28.3 %

## 2023-07-25 LAB — DIGOXIN LEVEL: Digoxin Level: 0.5 ng/mL — ABNORMAL LOW (ref 0.8–2.0)

## 2023-07-25 LAB — BRAIN NATRIURETIC PEPTIDE: B Natriuretic Peptide: 77.2 pg/mL (ref 0.0–100.0)

## 2023-07-25 MED ORDER — PERFLUTREN LIPID MICROSPHERE
1.0000 mL | INTRAVENOUS | Status: DC | PRN
  Administered 2023-07-25: 4 mL via INTRAVENOUS

## 2023-07-25 MED ORDER — METOPROLOL SUCCINATE ER 25 MG PO TB24: 25.0000 mg | ORAL_TABLET | Freq: Every day | ORAL | 2 refills | Status: DC

## 2023-07-25 NOTE — Progress Notes (Signed)
Medication Samples have been provided to the patient.  Drug name: Xarelto       Strength: 20 mg        Qty: 4  LOT: [id number]  Exp.Date: 09/2024  Dosing instructions: Take 1 tablet daily  The patient has been instructed regarding the correct time, dose, and frequency of taking this medication, including desired effects and most common side effects.   [name] [name] 12:37 PM 07/25/2023

## 2023-07-25 NOTE — Progress Notes (Signed)
Echocardiogram 2D Echocardiogram has been performed.  Augustine Radar 07/25/2023, 11:16 AM

## 2023-07-25 NOTE — Addendum Note (Signed)
Encounter addended by: Dolores Patty, MD on: 07/25/2023 11:14 PM  Actions taken: Clinical Note Signed

## 2023-07-25 NOTE — Patient Instructions (Signed)
Good to see you today!  START Toprol 25 mg at bedtime  Labs done today, your results will be available in MyChart, we will contact you for abnormal readings.  You have been referred to physical  therapy they will call you to schedule an appointment  Your physician recommends that you schedule a follow-up appointment in: 6 months February. Call  in December to schedule an appointment  If you have any questions or concerns before your next appointment please send [place] a message through [place] or call our office at [phone].    TO LEAVE A MESSAGE FOR THE NURSE SELECT OPTION 2, PLEASE LEAVE A MESSAGE INCLUDING: YOUR NAME DATE OF BIRTH CALL BACK NUMBER REASON FOR CALL**this is important as we prioritize the call backs  YOU WILL RECEIVE A CALL BACK THE SAME DAY AS LONG AS YOU CALL BEFORE 4:00 PM  At the [facility], you and your health needs are our priority. As part of our continuing mission to provide you with exceptional heart care, we have created designated Provider Care Teams. These Care Teams include your primary Cardiologist (physician) and Advanced Practice Providers (APPs- Physician Assistants and Nurse Practitioners) who all work together to provide you with the care you need, when you need it.   You may see any of the following providers on your designated Care Team at your next follow up: Dr [name] Dr Marca Ancona Dr. Marcos Eke, NP Robbie Lis, [place] [facility] [place], [place] Brynda Peon, NP Karle Plumber, PharmD   Please be sure to bring in all your medications bottles to every appointment.    Thank you for choosing [facility]

## 2023-07-26 ENCOUNTER — Ambulatory Visit: Payer: Medicare HMO

## 2023-07-26 DIAGNOSIS — I472 Ventricular tachycardia, unspecified: Secondary | ICD-10-CM

## 2023-07-27 LAB — CUP PACEART REMOTE DEVICE CHECK
Battery Remaining Longevity: 5 mo
Battery Voltage: 2.8 V
Brady Statistic AP VP Percent: 0 %
Brady Statistic AP VS Percent: 0 %
Brady Statistic AS VP Percent: 0 %
Brady Statistic AS VS Percent: 0 %
Brady Statistic RA Percent Paced: 0 %
Brady Statistic RV Percent Paced: 94.27 %
Date Time Interrogation Session: 20240820012402
HighPow Impedance: 76 Ohm
Implantable Lead Connection Status: 753985
Implantable Lead Connection Status: 753985
Implantable Lead Implant Date: 20010723
Implantable Lead Implant Date: 20180423
Implantable Lead Location: 753858
Implantable Lead Location: 753860
Implantable Lead Model: 6943
Implantable Pulse Generator Implant Date: 20180423
Lead Channel Impedance Value: 1007 Ohm
Lead Channel Impedance Value: 250.943
Lead Channel Impedance Value: 250.943
Lead Channel Impedance Value: 262.946
Lead Channel Impedance Value: 266 Ohm
Lead Channel Impedance Value: 279.525
Lead Channel Impedance Value: 304 Ohm
Lead Channel Impedance Value: 4047 Ohm
Lead Channel Impedance Value: 418 Ohm
Lead Channel Impedance Value: 475 Ohm
Lead Channel Impedance Value: 532 Ohm
Lead Channel Impedance Value: 532 Ohm
Lead Channel Impedance Value: 589 Ohm
Lead Channel Impedance Value: 931 Ohm
Lead Channel Impedance Value: 931 Ohm
Lead Channel Impedance Value: 931 Ohm
Lead Channel Impedance Value: 950 Ohm
Lead Channel Impedance Value: 988 Ohm
Lead Channel Pacing Threshold Amplitude: 1.375 V
Lead Channel Pacing Threshold Amplitude: 1.5 V
Lead Channel Pacing Threshold Pulse Width: 0.4 ms
Lead Channel Pacing Threshold Pulse Width: 1 ms
Lead Channel Sensing Intrinsic Amplitude: 11.375 mV
Lead Channel Sensing Intrinsic Amplitude: 11.375 mV
Lead Channel Setting Pacing Amplitude: 2.25 V
Lead Channel Setting Pacing Amplitude: 2.75 V
Lead Channel Setting Pacing Pulse Width: 0.4 ms
Lead Channel Setting Pacing Pulse Width: 1 ms
Lead Channel Setting Sensing Sensitivity: 0.3 mV
Zone Setting Status: 755011

## 2023-08-03 ENCOUNTER — Telehealth (HOSPITAL_COMMUNITY): Payer: Self-pay | Admitting: Cardiology

## 2023-08-03 DIAGNOSIS — I5022 Chronic systolic (congestive) heart failure: Secondary | ICD-10-CM

## 2023-08-03 DIAGNOSIS — R29898 Other symptoms and signs involving the musculoskeletal system: Secondary | ICD-10-CM

## 2023-08-03 NOTE — Telephone Encounter (Addendum)
Message left on triage line with questions regarding PT order   Returned call to discuss further [facility]  Pt reports he has not received call regarding PT as ordered at [facility]   Order for PT placed No reason noted Unsure if PT vs cardiac rehab is needed Will forward to provider team to review

## 2023-08-05 NOTE — Progress Notes (Signed)
Remote ICD transmission.   

## 2023-08-07 ENCOUNTER — Other Ambulatory Visit: Payer: Self-pay | Admitting: Family Medicine

## 2023-08-10 ENCOUNTER — Other Ambulatory Visit: Payer: Self-pay

## 2023-08-10 MED ORDER — EMPAGLIFLOZIN 10 MG PO TABS: 10.0000 mg | ORAL_TABLET | Freq: Every day | ORAL | 3 refills | Status: DC

## 2023-08-17 NOTE — Telephone Encounter (Signed)
Patient returned call to state still has not received call about therapy  Reports order should be for PT-leg pains/weakness  Will confirm if cards should place order or direct patient to PCP

## 2023-08-23 ENCOUNTER — Telehealth (HOSPITAL_COMMUNITY): Payer: Self-pay | Admitting: Cardiology

## 2023-08-23 NOTE — Telephone Encounter (Signed)
Patient called to request xarelto 20 samples    Medication Samples have been provided to the patient.  Drug name: xarelto       Strength: 20 mg        Qty: 28  LOT: 23MG 470  Exp.Date: 09/26  Dosing instructions: ONE TAB DAILY  The patient has been instructed regarding the correct time, dose, and frequency of taking this medication, including desired effects and most common side effects.   Theresia Bough 4:34 PM 08/23/2023

## 2023-08-26 ENCOUNTER — Ambulatory Visit (INDEPENDENT_AMBULATORY_CARE_PROVIDER_SITE_OTHER): Payer: Medicare HMO

## 2023-08-26 DIAGNOSIS — I255 Ischemic cardiomyopathy: Secondary | ICD-10-CM

## 2023-08-26 LAB — CUP PACEART REMOTE DEVICE CHECK
Battery Remaining Longevity: 4 mo
Battery Voltage: 2.76 V
Brady Statistic AP VP Percent: 0 %
Brady Statistic AP VS Percent: 0 %
Brady Statistic AS VP Percent: 0 %
Brady Statistic AS VS Percent: 0 %
Brady Statistic RA Percent Paced: 0 %
Brady Statistic RV Percent Paced: 93.42 %
Date Time Interrogation Session: 20240920043725
HighPow Impedance: 82 Ohm
Implantable Lead Connection Status: 753985
Implantable Lead Connection Status: 753985
Implantable Lead Implant Date: 20010723
Implantable Lead Implant Date: 20180423
Implantable Lead Location: 753858
Implantable Lead Location: 753860
Implantable Lead Model: 6943
Implantable Pulse Generator Implant Date: 20180423
Lead Channel Impedance Value: 246.635
Lead Channel Impedance Value: 250.943
Lead Channel Impedance Value: 261.164
Lead Channel Impedance Value: 262.946
Lead Channel Impedance Value: 279.525
Lead Channel Impedance Value: 285 Ohm
Lead Channel Impedance Value: 399 Ohm
Lead Channel Impedance Value: 4047 Ohm
Lead Channel Impedance Value: 475 Ohm
Lead Channel Impedance Value: 513 Ohm
Lead Channel Impedance Value: 532 Ohm
Lead Channel Impedance Value: 589 Ohm
Lead Channel Impedance Value: 893 Ohm
Lead Channel Impedance Value: 931 Ohm
Lead Channel Impedance Value: 931 Ohm
Lead Channel Impedance Value: 931 Ohm
Lead Channel Impedance Value: 950 Ohm
Lead Channel Impedance Value: 988 Ohm
Lead Channel Pacing Threshold Amplitude: 1.375 V
Lead Channel Pacing Threshold Amplitude: 1.5 V
Lead Channel Pacing Threshold Pulse Width: 0.4 ms
Lead Channel Pacing Threshold Pulse Width: 1 ms
Lead Channel Sensing Intrinsic Amplitude: 12 mV
Lead Channel Sensing Intrinsic Amplitude: 12 mV
Lead Channel Setting Pacing Amplitude: 2 V
Lead Channel Setting Pacing Amplitude: 3 V
Lead Channel Setting Pacing Pulse Width: 0.4 ms
Lead Channel Setting Pacing Pulse Width: 1 ms
Lead Channel Setting Sensing Sensitivity: 0.3 mV
Zone Setting Status: 755011

## 2023-09-01 NOTE — Addendum Note (Signed)
Addended by: Noralee Space on: 09/01/2023 03:28 PM   Modules accepted: Orders

## 2023-09-05 NOTE — Progress Notes (Signed)
Remote ICD transmission.   

## 2023-09-08 DIAGNOSIS — L814 Other melanin hyperpigmentation: Secondary | ICD-10-CM | POA: Diagnosis not present

## 2023-09-08 DIAGNOSIS — L570 Actinic keratosis: Secondary | ICD-10-CM | POA: Diagnosis not present

## 2023-09-08 DIAGNOSIS — Z08 Encounter for follow-up examination after completed treatment for malignant neoplasm: Secondary | ICD-10-CM | POA: Diagnosis not present

## 2023-09-08 DIAGNOSIS — D225 Melanocytic nevi of trunk: Secondary | ICD-10-CM | POA: Diagnosis not present

## 2023-09-08 DIAGNOSIS — L2989 Other pruritus: Secondary | ICD-10-CM | POA: Diagnosis not present

## 2023-09-08 DIAGNOSIS — L820 Inflamed seborrheic keratosis: Secondary | ICD-10-CM | POA: Diagnosis not present

## 2023-09-08 DIAGNOSIS — L821 Other seborrheic keratosis: Secondary | ICD-10-CM | POA: Diagnosis not present

## 2023-09-08 DIAGNOSIS — Z85828 Personal history of other malignant neoplasm of skin: Secondary | ICD-10-CM | POA: Diagnosis not present

## 2023-09-08 DIAGNOSIS — D492 Neoplasm of unspecified behavior of bone, soft tissue, and skin: Secondary | ICD-10-CM | POA: Diagnosis not present

## 2023-09-08 DIAGNOSIS — D0439 Carcinoma in situ of skin of other parts of face: Secondary | ICD-10-CM | POA: Diagnosis not present

## 2023-09-09 NOTE — Addendum Note (Signed)
Encounter addended by: Dolores Patty, MD on: 09/09/2023 4:19 PM  Actions taken: Clinical Note Signed

## 2023-09-13 ENCOUNTER — Other Ambulatory Visit: Payer: Self-pay | Admitting: Family Medicine

## 2023-09-26 ENCOUNTER — Ambulatory Visit: Payer: Medicare HMO

## 2023-09-26 DIAGNOSIS — I472 Ventricular tachycardia, unspecified: Secondary | ICD-10-CM

## 2023-09-26 DIAGNOSIS — I255 Ischemic cardiomyopathy: Secondary | ICD-10-CM

## 2023-09-26 LAB — CUP PACEART REMOTE DEVICE CHECK
Battery Remaining Longevity: 2 mo
Battery Voltage: 2.77 V
Brady Statistic AP VP Percent: 0 %
Brady Statistic AP VS Percent: 0 %
Brady Statistic AS VP Percent: 0 %
Brady Statistic AS VS Percent: 0 %
Brady Statistic RA Percent Paced: 0 %
Brady Statistic RV Percent Paced: 94.2 %
Date Time Interrogation Session: 20241021043824
HighPow Impedance: 79 Ohm
Implantable Lead Connection Status: 753985
Implantable Lead Connection Status: 753985
Implantable Lead Implant Date: 20010723
Implantable Lead Implant Date: 20180423
Implantable Lead Location: 753858
Implantable Lead Location: 753860
Implantable Lead Model: 6943
Implantable Pulse Generator Implant Date: 20180423
Lead Channel Impedance Value: 246.635
Lead Channel Impedance Value: 246.635
Lead Channel Impedance Value: 256.5 Ohm
Lead Channel Impedance Value: 262.946
Lead Channel Impedance Value: 274.19 Ohm
Lead Channel Impedance Value: 304 Ohm
Lead Channel Impedance Value: 4047 Ohm
Lead Channel Impedance Value: 418 Ohm
Lead Channel Impedance Value: 475 Ohm
Lead Channel Impedance Value: 513 Ohm
Lead Channel Impedance Value: 513 Ohm
Lead Channel Impedance Value: 589 Ohm
Lead Channel Impedance Value: 893 Ohm
Lead Channel Impedance Value: 893 Ohm
Lead Channel Impedance Value: 893 Ohm
Lead Channel Impedance Value: 931 Ohm
Lead Channel Impedance Value: 950 Ohm
Lead Channel Impedance Value: 988 Ohm
Lead Channel Pacing Threshold Amplitude: 1.25 V
Lead Channel Pacing Threshold Amplitude: 1.25 V
Lead Channel Pacing Threshold Pulse Width: 0.4 ms
Lead Channel Pacing Threshold Pulse Width: 1 ms
Lead Channel Sensing Intrinsic Amplitude: 14.625 mV
Lead Channel Sensing Intrinsic Amplitude: 14.625 mV
Lead Channel Setting Pacing Amplitude: 2 V
Lead Channel Setting Pacing Amplitude: 2.5 V
Lead Channel Setting Pacing Pulse Width: 0.4 ms
Lead Channel Setting Pacing Pulse Width: 1 ms
Lead Channel Setting Sensing Sensitivity: 0.3 mV
Zone Setting Status: 755011

## 2023-09-26 NOTE — Therapy (Signed)
OUTPATIENT PHYSICAL THERAPY LOWER EXTREMITY EVALUATION   Patient Name: Carl Wood MRN: [id number] DOB:06/07/1941, 82 y.o., adult Today's Date: 09/28/2023  END OF SESSION:  PT End of Session - 09/28/23 0603     Visit Number 1    Number of Visits 17    Date for PT Re-Evaluation 11/30/23    Authorization Type [employer] MEDICARE HMO    PT Start Time 1415    PT Stop Time 1500    PT Time Calculation (min) 45 min    Activity Tolerance Patient tolerated treatment well    Behavior During Therapy WFL for tasks assessed/performed             Past Medical History:  Diagnosis Date   CAD (coronary artery disease)    CHF (congestive heart failure) (HCC)    Chronic atrial fibrillation (HCC)    Colon polyps    Diabetes mellitus    Diverticulosis of colon    DIVERTICULOSIS, COLON 10/16/2006   Qualifier: Diagnosis of  By: Cato Mulligan MD, Bruce     Excessive daytime sleepiness 02/19/2016   Hyperlipidemia    Hypertension    MI (mitral incompetence)    Nephrolithiasis    NEPHROLITHIASIS 06/26/2008   Qualifier: Diagnosis of  By: Linna Darner, CMA, Cindy     OSA (obstructive sleep apnea) 02/19/2016   Moderate to severe OSA with an AHI of 25/hr and on CPAP at 8cm H2O   PE (pulmonary embolism)    V-tach [facility])    Past Surgical History:  Procedure Laterality Date   BACK SURGERY     BASAL CELL CARCINOMA EXCISION     nose   BIV UPGRADE N/A 03/28/2017   Procedure: BiV Upgrade;  Surgeon: Duke Salvia, MD;  Location: [facility] INVASIVE CV LAB;  Service: Cardiovascular;  Laterality: N/A;   CARDIAC CATHETERIZATION N/A 01/20/2016   Procedure: Right/Left Heart Cath and Coronary Angiography;  Surgeon: Dolores Patty, MD;  Location: [facility] INVASIVE CV LAB;  Service: Cardiovascular;  Laterality: N/A;   CARDIAC DEFIBRILLATOR PLACEMENT     medtronic virtuoso   CHOLECYSTECTOMY     COLONOSCOPY  12/11/2012   Procedure: COLONOSCOPY;  Surgeon: Louis Meckel, MD;  Location: [facility];  Service: Endoscopy;   Laterality: N/A;   KNEE ARTHROPLASTY     Patient Active Problem List   Diagnosis Date Noted   Permanent atrial fibrillation (HCC) 05/10/2023   IVCD (intraventricular conduction defect) 05/10/2023   COPD (chronic obstructive pulmonary disease) (HCC) 12/26/2018   Osteoarthritis of right knee 09/22/2018   History of subdural hematoma 11/21/2017   Frontal headache 10/06/2017   Rhinitis 08/22/2017   Dyspnea on exertion 08/22/2017   Ischemic cardiomyopathy 03/28/2017   Chronic atrial fibrillation (HCC)    OSA (obstructive sleep apnea) 02/19/2016   Chronic systolic heart failure (HCC) 08/06/2015   LBBB (left bundle branch block) 08/06/2015   Carotid artery stenosis 03/17/2015   Former smoker 11/15/2014   Actinic keratosis 11/15/2014   History of cardioembolic cerebrovascular accident (CVA) 12/19/2012   Benign neoplasm of colon 12/11/2012   Implantable cardioverter-defibrillator (ICD) in situ 02/17/2012    ventricular tachycardia-non sustained  02/17/2012   History of skin cancer 07/05/2007   Diabetes mellitus type II, controlled (HCC) 10/16/2006   Hyperlipidemia associated with type 2 diabetes mellitus (HCC) 10/16/2006   Essential hypertension 10/16/2006   CAD (coronary artery disease) 10/16/2006   Chronic pulmonary embolism (HCC) 10/16/2006    PCP: Shelva Majestic, MD   REFERRING PROVIDER: Dolores Patty, MD  REFERRING DIAG:  I50.22 (ICD-10-CM) - Chronic systolic heart failure (HCC)  W09.811 (ICD-10-CM) - Weakness of both lower extremities    THERAPY DIAG:  Difficulty in walking, not elsewhere classified - Plan: PT plan of care cert/re-cert  Muscle weakness (generalized) - Plan: PT plan of care cert/re-cert  Abnormal posture - Plan: PT plan of care cert/re-cert  Rationale for Evaluation and Treatment: Rehabilitation  ONSET DATE: chronic  SUBJECTIVE:   SUBJECTIVE STATEMENT: Pt reports over several years his legs have become weaker, his balance has decreased,  and his walking tolerance decreased. Pt denies falling. Pt will use a rollator in the community when he knows he will need stand or walk for prolong time frames. Pt reports a Hx of L hip pain which can interfere with his mobility at times.  PERTINENT HISTORY: CAD, chronic Afib, DM, Hx back surgery, Hx CVA, defibrillator  PAIN:  Are you having pain? Yes: NPRS scale: 3/10. Pain range on eval: 0-10/10 Pain location: L hip Pain description: sharp, ache Aggravating factors: Sit to stand, prolonged sitting, standing, and walking Relieving factors: Rest, sleeps in a recliner  PRECAUTIONS: Other: Cardiopulmonary , defibrillator  RED FLAGS: None   WEIGHT BEARING RESTRICTIONS: No  FALLS:  Has patient fallen in last 6 months? No  LIVING ENVIRONMENT: Lives with: lives with their family Lives in: House/apartment No issue with accessing home or mobility within  OCCUPATION: Retired  PLOF: Independent  PATIENT GOALS: Improved leg strength, balance, and activity tolerance   OBJECTIVE:  Note: Objective measures were completed at Evaluation unless otherwise noted.  DIAGNOSTIC FINDINGS: NA  PATIENT SURVEYS:  FOTO: Perceived function   41%, predicted   50%   COGNITION: Overall cognitive status: Within functional limits for tasks assessed     SENSATION: WFL  EDEMA:  NT  MUSCLE LENGTH: Hamstrings: Right NT deg; Left NT deg Thomas test: Right NT deg; Left NT deg  POSTURE: rounded shoulders, forward head, increased lumbar lordosis, increased thoracic kyphosis, posterior pelvic tilt, and flexed trunk   PALPATION: NT  LOWER EXTREMITY MMT:  Active MMT Right eval Left eval  Hip flexion 4 4  Hip extension 3 3  Hip abduction 3+ 3+  Hip adduction    Hip internal rotation    Hip external rotation 4 4  Knee flexion 5 5  Knee extension 5 5  Ankle dorsiflexion 4 3  Ankle plantarflexion 4 3  Ankle inversion    Ankle eversion     (Blank rows = not tested)  LOWER EXTREMITY  ROM:  ROM Right eval Left eval  Hip flexion    Hip extension Decreased Decreased  Hip abduction Decreased Decreased  Hip adduction    Hip internal rotation Decreased Decreased  Hip external rotation Decreased Decreased  Knee flexion    Knee extension    Ankle dorsiflexion Decreased Decreased  Ankle plantarflexion    Ankle inversion    Ankle eversion     (Blank rows = not tested)  LOWER EXTREMITY SPECIAL TESTS:  Hip special tests: Luisa Hart (FABER) test: negative  FUNCTIONAL TESTS:  5 times sit to stand: 20.7" c use of hands 2 minute walk test: TBA Single leg stand: L=1", R=5" Berg Balance: TBA  GAIT: Distance walked: 150' Assistive device utilized: None Level of assistance: Complete Independence Comments: Decreased step length, decreased ankle DF,    TODAY'S TREATMENT:  [facility] Adult PT Treatment:                                                DATE: 09/27/23 Therapeutic Exercise: Developed, instructed in, and pt completed therex as noted in HEP   PATIENT EDUCATION:  Education details: Eval findings, POC, HEP Person educated: Patient Education method: Explanation, Demonstration, Tactile cues, Verbal cues, and Handouts Education comprehension: verbalized understanding, returned demonstration, verbal cues required, and tactile cues required  HOME EXERCISE PROGRAM: Access Code: [email or web address] URL: [email or web address] Date: 09/27/2023 Prepared by: Joellyn Rued  Exercises - Sit to Stand with Counter Support  - 2-3 x daily - 7 x weekly - 3 sets - 10 reps - Standing Single Leg Stance with Counter Support  - 2-3 x daily - 7 x weekly - 3 sets - 5 reps - 20 hold  ASSESSMENT:  CLINICAL IMPRESSION: Patient is a 82 y.o. male who was seen today for physical therapy evaluation and treatment for  I50.22 (ICD-10-CM) - Chronic systolic heart failure  (HCC)  R29.898 (ICD-10-CM) - Weakness of both lower extremities  .Pt presents to PT with decreased LE strength, decreased balance, decreased activity tolerance, postural changes impacting COG and balance, L hip pain, and decreased tolerance to activity. Pt will benefit from skilled PT to address impairments to optimize functional mobility.   OBJECTIVE IMPAIRMENTS: decreased activity tolerance, decreased balance, decreased endurance, difficulty walking, decreased ROM, decreased strength, postural dysfunction, and pain.   ACTIVITY LIMITATIONS: carrying, lifting, bending, standing, squatting, sleeping, stairs, locomotion level, and caring for others  PARTICIPATION LIMITATIONS: meal prep, cleaning, laundry, driving, shopping, community activity, and yard work  PERSONAL FACTORS: Age, Fitness, Past/current experiences, Time since onset of injury/illness/exacerbation, and 1-2 comorbidities: cadiopulmonary issues, DM, Hx of back surgery  are also affecting patient's functional outcome.   REHAB POTENTIAL: Good  CLINICAL DECISION MAKING: Evolving/moderate complexity  EVALUATION COMPLEXITY: Moderate   GOALS:  SHORT TERM GOALS: Target date: 10/21/23 Pt will be Ind in an initial HEP  Baseline:started Goal status: INITIAL  LONG TERM GOALS: Target date: 11/30/23  Pt will be Ind in a final HEP to maintain achieved LOF  Baseline:  Goal status: INITIAL  2.  Increase bilat hip strength and ankle strength by muscle grade for improved function and balance Baseline: see flow sheet Goal status: INITIAL  3.  Improve 5xSTS by MCID of 5" and by MCID of 47ft as indication of improved functional mobility  Baseline: 5xSTS 21.7; TBA Goal status: INITIAL  4.  Improve Berg balance by 5 pts as indication of improved balance Baseline: TBA Goal status: INITIAL  5.  Increase L LE single leg stand to 5" for improved balance Baseline: L 1", R 5" Goal status: INITIAL  6.  Pt's FOTO score will  improved to the predicted value of 50% as indication of improved function  Baseline: 41% Goal status: INITIAL   PLAN:  PT FREQUENCY: 2x/week  PT DURATION: 8 weeks  PLANNED INTERVENTIONS: 97164- PT Re-evaluation, 97110-Therapeutic exercises, 97530- Therapeutic activity, 97112- Neuromuscular re-education, 97535- Self Care, 47829- Manual therapy, [phone]- Gait training, Patient/Family education, Balance training, Stair training, Joint manipulation, Cryotherapy, and Moist heat  PLAN FOR NEXT SESSION: Assess and Berg; Review FOTO; assess response to HEP; progress therex as indicated; use of modalities, manual therapy; and TPDN as indicated.  Terique Kawabata MS, PT 09/28/23 1:21 PM  Referring diagnosis?  I50.22 (ICD-10-CM) - Chronic systolic heart failure (HCC)  W29.562 (ICD-10-CM) - Weakness of both lower extremities    Treatment diagnosis? (if different than referring diagnosis) Difficulty in walking, not elsewhere classified, Muscle weakness(generalized), Abnormal posture What was this (referring dx) caused by? []  Surgery []  Fall [x]  Ongoing issue [x]  Arthritis []  Other: ____________  Laterality: []  Rt []  Lt [x]  Both  Check all possible CPT codes:  *CHOOSE 10 OR LESS*    [x]  97110 (Therapeutic Exercise)  []  92507 (SLP Treatment)  []  97112 (Neuro Re-ed)   []  92526 (Swallowing Treatment)   [x]  97116 (Gait Training)   []  [id number] (Cognitive Training, 1st 15 minutes) [x]  97140 (Manual Therapy)   []  97130 (Cognitive Training, each add'l 15 minutes)  [x]  97164 (Re-evaluation)                              []  Other, List CPT Code ____________  [x]  97530 (Therapeutic Activities)     [x]  97535 (Self Care)   []  All codes above (97110 - 97535)  []  97012 (Mechanical Traction)  [x]  97014 (E-stim Unattended)  []  97032 (E-stim manual)  [x]  97033 (Ionto)  [x]  97035 (Ultrasound) []  97750 (Physical Performance Training) [x]  [id number] (Aquatic Therapy) []  97016 (Vasopneumatic Device) []  [id number]  (Paraffin) []  97034 (Contrast Bath) []  97597 (Wound Care 1st 20 sq cm) []  97598 (Wound Care each add'l 20 sq cm) []  97760 (Orthotic Fabrication, Fitting, Training Initial) []  [id number] (Prosthetic Management and Training Initial) []  [id number] (Orthotic or Prosthetic Training/ Modification Subsequent)

## 2023-09-27 ENCOUNTER — Ambulatory Visit: Payer: Medicare HMO | Attending: Internal Medicine

## 2023-09-27 DIAGNOSIS — R293 Abnormal posture: Secondary | ICD-10-CM | POA: Diagnosis not present

## 2023-09-27 DIAGNOSIS — I5022 Chronic systolic (congestive) heart failure: Secondary | ICD-10-CM | POA: Diagnosis not present

## 2023-09-27 DIAGNOSIS — M6281 Muscle weakness (generalized): Secondary | ICD-10-CM | POA: Insufficient documentation

## 2023-09-27 DIAGNOSIS — R262 Difficulty in walking, not elsewhere classified: Secondary | ICD-10-CM | POA: Diagnosis not present

## 2023-09-27 DIAGNOSIS — R29898 Other symptoms and signs involving the musculoskeletal system: Secondary | ICD-10-CM | POA: Diagnosis not present

## 2023-10-03 ENCOUNTER — Other Ambulatory Visit: Payer: Self-pay | Admitting: Family Medicine

## 2023-10-10 ENCOUNTER — Ambulatory Visit: Payer: Medicare HMO | Admitting: Physical Therapy

## 2023-10-12 ENCOUNTER — Encounter: Payer: Self-pay | Admitting: Internal Medicine

## 2023-10-12 ENCOUNTER — Ambulatory Visit: Payer: Medicare HMO | Attending: Internal Medicine | Admitting: Internal Medicine

## 2023-10-12 VITALS — BP 104/58 | HR 83 | Ht 72.0 in | Wt 195.8 lb

## 2023-10-12 DIAGNOSIS — I255 Ischemic cardiomyopathy: Secondary | ICD-10-CM | POA: Diagnosis not present

## 2023-10-12 DIAGNOSIS — I4821 Permanent atrial fibrillation: Secondary | ICD-10-CM

## 2023-10-12 DIAGNOSIS — Z95810 Presence of automatic (implantable) cardiac defibrillator: Secondary | ICD-10-CM

## 2023-10-12 DIAGNOSIS — I5022 Chronic systolic (congestive) heart failure: Secondary | ICD-10-CM

## 2023-10-12 NOTE — Progress Notes (Signed)
Patient Care Team: Shelva Majestic, MD as PCP - General (Family Medicine) Quintella Reichert, MD as PCP - Sleep Medicine (Cardiology) Bensimhon, Bevelyn Buckles, MD as PCP - Advanced Heart Failure (Cardiology) Duke Salvia, MD as PCP - Electrophysiology (Cardiology) Duke Salvia, MD as Consulting Physician (Cardiology) Bevelyn Ngo, NP as Nurse Practitioner (Pulmonary Disease) Drema Dallas, DO as Consulting Physician (Neurology) [facility], [facility] as Consulting Physician (Ophthalmology) Wynona Canes, MD as Consulting Physician (Dermatology)   HPI  CC  Ventricular tachycardia and CAD  Carl Wood is a 82 y.o. adult is seen in followup for ischemic heart disease with ICD implanted for ventricular tachycardia. He has history of bypass grafting and depressed left ventricular function. He has a history of recurrent ventricular tachycardia--most recently polymorphic in september 2021  associated with low-normal magnesium.     Permanent atrial fibrillation.  Anticoagulated with Xarelto.  12/18 evaluation for chronic headaches showed bilateral small subdurals.  Xarelto was stopped -- subdurals resolved and Xarelto resumed. no bleeding  CRT upgrade 4/18.--He had an IVCD but anterior precordial notching.   The patient denies chest pain, shortness of breath, nocturnal dyspnea, orthopnea or peripheral edema.  There have been no palpitations, lightheadedness or syncope.      DATE TEST EF   7/16 Echo    20 %   2/17 Cath   LAD disease  Nonviable ant wall   10/17 Echo   20-25%   8/19 Echo  20%   1/21 Echo  20-25%   2/23 Echo   25-30%   8/24 Echo  25-30%      Date Cr K Hgb Dig  1/21 1.07 4.0 16.2 0.5  4/22  0.98 4.1 16.1 0.5  }4/23 1.08 4.1 15.4 0.3 (2/23)  2/24 0.83 4.5 16.5 0.2  8/24 1.05 4.0 15.8 0.5      Thromboembolic risk factors ( age -67 , HTN-1, TIA/CVA-2, DM-1, Vasc disease -1, CHF-1) for a CHADSVASc Score of >=8  Past Medical History:   Diagnosis Date   CAD (coronary artery disease)    CHF (congestive heart failure) (HCC)    Chronic atrial fibrillation (HCC)    Colon polyps    Diabetes mellitus    Diverticulosis of colon    DIVERTICULOSIS, COLON 10/16/2006   Qualifier: Diagnosis of  By: Cato Mulligan MD, Bruce     Excessive daytime sleepiness 02/19/2016   Hyperlipidemia    Hypertension    MI (mitral incompetence)    Nephrolithiasis    NEPHROLITHIASIS 06/26/2008   Qualifier: Diagnosis of  By: Linna Darner, CMA, Cindy     OSA (obstructive sleep apnea) 02/19/2016   Moderate to severe OSA with an AHI of 25/hr and on CPAP at 8cm H2O   PE (pulmonary embolism)    V-tach [facility])     Past Surgical History:  Procedure Laterality Date   BACK SURGERY     BASAL CELL CARCINOMA EXCISION     nose   BIV UPGRADE N/A 03/28/2017   Procedure: BiV Upgrade;  Surgeon: Duke Salvia, MD;  Location: [facility] INVASIVE CV LAB;  Service: Cardiovascular;  Laterality: N/A;   CARDIAC CATHETERIZATION N/A 01/20/2016   Procedure: Right/Left Heart Cath and Coronary Angiography;  Surgeon: Dolores Patty, MD;  Location: [facility] INVASIVE CV LAB;  Service: Cardiovascular;  Laterality: N/A;   CARDIAC DEFIBRILLATOR PLACEMENT     medtronic virtuoso   CHOLECYSTECTOMY     COLONOSCOPY  12/11/2012   Procedure: COLONOSCOPY;  Surgeon: Louis Meckel, MD;  Location: [facility];  Service: Endoscopy;  Laterality: N/A;   KNEE ARTHROPLASTY      Current Outpatient Medications  Medication Sig Dispense Refill   Ascorbic Acid (VITAMIN C ER PO) Take 1 tablet by mouth daily.     blood glucose meter kit and supplies KIT Dispense based on patient and insurance preference. Use up to four times daily as directed. 1 each 0   cetirizine (ZYRTEC) 10 MG tablet Take 10 mg by mouth daily.     digoxin (LANOXIN) 0.25 MG tablet Take 0.5 tablets (0.125 mg total) by mouth daily. 45 tablet 3   empagliflozin (JARDIANCE) 10 MG TABS tablet Take 1 tablet (10 mg total) by mouth daily. 90 tablet 3    furosemide (LASIX) 20 MG tablet Take 1 tablet by mouth once a week 12 tablet 0   glimepiride (AMARYL) 4 MG tablet TAKE 2 TABLETS BY MOUTH ONCE DAILY WITH BREAKFAST 180 tablet 0   glucose blood (ACCU-CHEK GUIDE) test strip USE UP TO FOUR TIMES DAILY Dx: E11.9 450 each 3   metFORMIN (GLUCOPHAGE-XR) 500 MG 24 hr tablet TAKE 2 TABLETS BY MOUTH IN THE MORNING AND 2 AT BEDTIME 360 tablet 0   metoprolol succinate (TOPROL XL) 25 MG 24 hr tablet Take 1 tablet (25 mg total) by mouth at bedtime. 90 tablet 2   rivaroxaban (XARELTO) 20 MG TABS tablet Take 1 tablet by mouth once daily 90 tablet 2   rosuvastatin (CRESTOR) 40 MG tablet Take 1 tablet by mouth once daily 90 tablet 0   sacubitril-valsartan (ENTRESTO) 24-26 MG Take 1 tablet by mouth 2 (two) times daily. 180 tablet 3   Turmeric 500 MG CAPS Take by mouth. 1 daily     No current facility-administered medications for this visit.    Allergies  Allergen Reactions   Penicillins Other (See Comments)    Patient passed out   Sulfamethoxazole Rash    Review of Systems negative except from HPI and PMH  Physical Exam BP (!) 104/58   Pulse 83   Ht 6' (1.829 m)   Wt 195 lb 12.8 oz (88.8 kg)   SpO2 95%   BMI 26.56 kg/m  Well developed and well nourished in no acute distress HENT normal Neck supple with JVP-flat Clear Device pocket well healed; without hematoma or erythema.  There is no tethering  Regular rate and rhythm, no murmur Abd-soft with active BS No Clubbing cyanosis  edema Skin-warm and dry A & Oriented  Grossly normal sensory and motor function  ECG afib @ 83 -/15/41 Upright QRS V1 Neg QRS lead 1    Device function is normal. Programming changes none, alert tolerance demonstrated See Paceart for details    Assessment and  Plan  Atrial fibrillation-permanent  IVCD  Ischemic cardiomyopathy prior bypass surgery  ICD-Medtronic-CRT    Without symptoms of ischemia.  Continue statin,  and Apixaban   No bleeding continue  Apixaban    Episodes of nonsustained VT detected by the device.  By electrogram morphologies, rapid atrial fibrillation.  Device is approaching ERI.  Demonstrated tones

## 2023-10-12 NOTE — Patient Instructions (Signed)
Medication Instructions:  Your physician recommends that you continue on your current medications as directed. Please refer to the Current Medication list given to you today.  *If you need a refill on your cardiac medications before your next appointment, please call your pharmacy*   Lab Work: None ordered.  If you have labs (blood work) drawn today and your tests are completely normal, you will receive your results only by: MyChart Message (if you have MyChart) OR A paper copy in the mail If you have any lab test that is abnormal or we need to change your treatment, we will call you to review the results.   Testing/Procedures: None ordered.    Follow-Up: At [facility], you and your health needs are our priority.  As part of our continuing mission to provide you with exceptional heart care, we have created designated Provider Care Teams.  These Care Teams include your primary Cardiologist (physician) and Advanced Practice Providers (APPs -  Physician Assistants and Nurse Practitioners) who all work together to provide you with the care you need, when you need it.  We recommend signing up for the patient portal called "MyChart".  Sign up information is provided on this After Visit Summary.  MyChart is used to connect with patients for Virtual Visits (Telemedicine).  Patients are able to view lab/test results, encounter notes, upcoming appointments, etc.  Non-urgent messages can be sent to your provider as well.   To learn more about what you can do with MyChart, go to [email or web address].    Your next appointment:   3-4 months with Dr Graciela Husbands

## 2023-10-13 ENCOUNTER — Encounter: Payer: Self-pay | Admitting: Physical Therapy

## 2023-10-13 ENCOUNTER — Ambulatory Visit: Payer: Medicare HMO | Attending: Internal Medicine | Admitting: Physical Therapy

## 2023-10-13 DIAGNOSIS — R293 Abnormal posture: Secondary | ICD-10-CM | POA: Diagnosis not present

## 2023-10-13 DIAGNOSIS — M6281 Muscle weakness (generalized): Secondary | ICD-10-CM | POA: Insufficient documentation

## 2023-10-13 DIAGNOSIS — R262 Difficulty in walking, not elsewhere classified: Secondary | ICD-10-CM | POA: Insufficient documentation

## 2023-10-13 NOTE — Progress Notes (Signed)
Remote ICD transmission.   

## 2023-10-13 NOTE — Therapy (Signed)
OUTPATIENT PHYSICAL THERAPY LOWER EXTREMITY TREATMENT   Patient Name: Carl Wood MRN: [id number] DOB:June 04, 1941, 82 y.o., adult Today's Date: 10/13/2023  END OF SESSION:  PT End of Session - 10/13/23 1057     Visit Number 2    Number of Visits 17    Date for PT Re-Evaluation 11/30/23    Authorization Type [employer] MEDICARE HMO    PT Start Time 1100    PT Stop Time 1145    PT Time Calculation (min) 45 min             Past Medical History:  Diagnosis Date   CAD (coronary artery disease)    CHF (congestive heart failure) (HCC)    Chronic atrial fibrillation (HCC)    Colon polyps    Diabetes mellitus    Diverticulosis of colon    DIVERTICULOSIS, COLON 10/16/2006   Qualifier: Diagnosis of  By: Cato Mulligan MD, Bruce     Excessive daytime sleepiness 02/19/2016   Hyperlipidemia    Hypertension    MI (mitral incompetence)    Nephrolithiasis    NEPHROLITHIASIS 06/26/2008   Qualifier: Diagnosis of  By: Linna Darner, CMA, Cindy     OSA (obstructive sleep apnea) 02/19/2016   Moderate to severe OSA with an AHI of 25/hr and on CPAP at 8cm H2O   PE (pulmonary embolism)    V-tach [facility])    Past Surgical History:  Procedure Laterality Date   BACK SURGERY     BASAL CELL CARCINOMA EXCISION     nose   BIV UPGRADE N/A 03/28/2017   Procedure: BiV Upgrade;  Surgeon: Duke Salvia, MD;  Location: [facility] INVASIVE CV LAB;  Service: Cardiovascular;  Laterality: N/A;   CARDIAC CATHETERIZATION N/A 01/20/2016   Procedure: Right/Left Heart Cath and Coronary Angiography;  Surgeon: Dolores Patty, MD;  Location: [facility] INVASIVE CV LAB;  Service: Cardiovascular;  Laterality: N/A;   CARDIAC DEFIBRILLATOR PLACEMENT     medtronic virtuoso   CHOLECYSTECTOMY     COLONOSCOPY  12/11/2012   Procedure: COLONOSCOPY;  Surgeon: Louis Meckel, MD;  Location: [facility] ENDOSCOPY;  Service: Endoscopy;  Laterality: N/A;   KNEE ARTHROPLASTY     Patient Active Problem List   Diagnosis Date Noted   Permanent atrial fibrillation  (HCC) 05/10/2023   IVCD (intraventricular conduction defect) 05/10/2023   COPD (chronic obstructive pulmonary disease) (HCC) 12/26/2018   Osteoarthritis of right knee 09/22/2018   History of subdural hematoma 11/21/2017   Frontal headache 10/06/2017   Rhinitis 08/22/2017   Dyspnea on exertion 08/22/2017   Ischemic cardiomyopathy 03/28/2017   Chronic atrial fibrillation (HCC)    OSA (obstructive sleep apnea) 02/19/2016   Chronic systolic heart failure (HCC) 08/06/2015   LBBB (left bundle branch block) 08/06/2015   Carotid artery stenosis 03/17/2015   Former smoker 11/15/2014   Actinic keratosis 11/15/2014   History of cardioembolic cerebrovascular accident (CVA) 12/19/2012   Benign neoplasm of colon 12/11/2012   Implantable cardioverter-defibrillator (ICD) in situ 02/17/2012    ventricular tachycardia-non sustained  02/17/2012   History of skin cancer 07/05/2007   Diabetes mellitus type II, controlled (HCC) 10/16/2006   Hyperlipidemia associated with type 2 diabetes mellitus (HCC) 10/16/2006   Essential hypertension 10/16/2006   CAD (coronary artery disease) 10/16/2006   Chronic pulmonary embolism (HCC) 10/16/2006    PCP: Shelva Majestic, MD   REFERRING PROVIDER: Dolores Patty, MD   REFERRING DIAG:  I50.22 (ICD-10-CM) - Chronic systolic heart failure (HCC)  V25.366 (ICD-10-CM) - Weakness of both lower  extremities    THERAPY DIAG:  Difficulty in walking, not elsewhere classified  Muscle weakness (generalized)  Abnormal posture  Rationale for Evaluation and Treatment: Rehabilitation  ONSET DATE: chronic  SUBJECTIVE:   SUBJECTIVE STATEMENT: My hip left hip is bothering. Sometimes it feels like it will give out.    EVAL: Pt reports over several years his legs have become weaker, his balance has decreased, and his walking tolerance decreased. Pt denies falling. Pt will use a rollator in the community when he knows he will need stand or walk for prolong time  frames. Pt reports a Hx of L hip pain which can interfere with his mobility at times.  PERTINENT HISTORY: CAD, chronic Afib, DM, Hx back surgery, Hx CVA, defibrillator  PAIN:  Are you having pain? Yes: NPRS scale: 3/10. Pain range on eval: 0-10/10 Pain location: L hip Pain description: sharp, ache Aggravating factors: Sit to stand, prolonged sitting, standing, and walking Relieving factors: Rest, sleeps in a recliner  PRECAUTIONS: Other: Cardiopulmonary , defibrillator  RED FLAGS: None   WEIGHT BEARING RESTRICTIONS: No  FALLS:  Has patient fallen in last 6 months? No  LIVING ENVIRONMENT: Lives with: lives with their family Lives in: House/apartment No issue with accessing home or mobility within  OCCUPATION: Retired  PLOF: Independent  PATIENT GOALS: Improved leg strength, balance, and activity tolerance   OBJECTIVE:  Note: Objective measures were completed at Evaluation unless otherwise noted.  DIAGNOSTIC FINDINGS: NA  PATIENT SURVEYS:  FOTO: Perceived function   41%, predicted   50%   COGNITION: Overall cognitive status: Within functional limits for tasks assessed     SENSATION: WFL  EDEMA:  NT  MUSCLE LENGTH: Hamstrings: Right NT deg; Left NT deg Thomas test: Right NT deg; Left NT deg  POSTURE: rounded shoulders, forward head, increased lumbar lordosis, increased thoracic kyphosis, posterior pelvic tilt, and flexed trunk   PALPATION: NT  LOWER EXTREMITY MMT:  Active MMT Right eval Left eval  Hip flexion 4 4  Hip extension 3 3  Hip abduction 3+ 3+  Hip adduction    Hip internal rotation    Hip external rotation 4 4  Knee flexion 5 5  Knee extension 5 5  Ankle dorsiflexion 4 3  Ankle plantarflexion 4 3  Ankle inversion    Ankle eversion     (Blank rows = not tested)  LOWER EXTREMITY ROM:  ROM Right eval Left eval  Hip flexion    Hip extension Decreased Decreased  Hip abduction Decreased Decreased  Hip adduction    Hip internal  rotation Decreased Decreased  Hip external rotation Decreased Decreased  Knee flexion    Knee extension    Ankle dorsiflexion Decreased Decreased  Ankle plantarflexion    Ankle inversion    Ankle eversion     (Blank rows = not tested)  LOWER EXTREMITY SPECIAL TESTS:  Hip special tests: Luisa Hart (FABER) test: negative  FUNCTIONAL TESTS:  5 times sit to stand: 20.7" c use of hands 2 minute walk test: TBA: 10/13/23: 282 feet with SPC Single leg stand: L=1", R=5" Berg Balance: 48/56 10/13/23  GAIT: Distance walked: 150' Assistive device utilized: None Level of assistance: Complete Independence Comments: Decreased step length, decreased ankle DF,    TODAY'S TREATMENT:      Therapeutic Activity:  2 MWT 282 feet STS from elevated seat x 6 Side stepping along counter 3 x down and back Tandem stance 45 sec- cues for posture SLS trials 5-10 sec with intermittent touch  BERG BALANCE TEST Sitting to Standing: 3.      Stands independently using hands Standing Unsupported: 4.      Stands safely for 2 minutes Sitting Unsupported: 4.     Sits for 2 minutes independently Standing to Sitting: 3.     Controls descent with hands  Transfers: 3.     Transfers safely definite use of hands Standing with eyes closed: 4.     Stands safely for 10 seconds  Standing with feet together: 4.     Stands for 1 minute safely Reaching forward with outstretched arm: 4.     Reaches forward 10 inches Retrieving object from the floor: 4.      Able to pick up easily and safely Turning to look behind: 4.     Looks behind from both sides and weight shifts well Turning 360 degrees: 2.     Able to turn slowly, but safely Place alternate foot on stool: 4.     Completes 8 steps in 20 seconds     Standing with one foot in front: 3.     Independent foot ahead for 30 seconds Standing on one foot: 2.      Holds >/=3 seconds Total Score: 48/56   [facility] Adult PT Treatment:                                                DATE: 09/27/23 Therapeutic Exercise: Developed, instructed in, and pt completed therex as noted in HEP   PATIENT EDUCATION:  Education details: Eval findings, POC, HEP Person educated: Patient Education method: Explanation, Demonstration, Tactile cues, Verbal cues, and Handouts Education comprehension: verbalized understanding, returned demonstration, verbal cues required, and tactile cues required  HOME EXERCISE PROGRAM: Access Code: [email or web address] URL: [email or web address] Date: 09/27/2023 Prepared by: Joellyn Rued  Exercises - Sit to Stand with Counter Support  - 2-3 x daily - 7 x weekly - 3 sets - 10 reps - Standing Single Leg Stance with Counter Support  - 2-3 x daily - 7 x weekly - 3 sets - 5 reps - 20 hold - Standing Tandem Balance with Counter Support  - 1 x daily - 7 x weekly - 1 sets - 3 reps - 30 hold - Side Stepping with Counter Support  - 1 x daily - 7 x weekly - 1 sets - 3 reps  ASSESSMENT:  CLINICAL IMPRESSION: Captured functional testing today: BERG 48/56. 2 MWT 282 feet. Discussed completing HEP more than 2- 3 times per week, recommend daily. Added 2 more exercises for HEP. Pt limited by left hip pain today, especially with stooping.    EVAL: Patient is a 82 y.o. male who was seen today for physical therapy evaluation and treatment for  I50.22 (ICD-10-CM) - Chronic systolic heart failure (HCC)  O53.664 (ICD-10-CM) - Weakness of both lower extremities  .Pt presents to PT with decreased LE strength, decreased balance, decreased activity tolerance, postural changes impacting COG and balance, L hip pain, and decreased tolerance to activity. Pt will benefit from skilled PT to address impairments to optimize functional mobility.   OBJECTIVE IMPAIRMENTS: decreased activity tolerance, decreased balance, decreased endurance, difficulty walking,  decreased ROM, decreased strength, postural dysfunction, and pain.   ACTIVITY LIMITATIONS: carrying, lifting, bending, standing, squatting, sleeping, stairs, locomotion level, and caring for others  PARTICIPATION LIMITATIONS: meal prep, cleaning, laundry, driving, shopping, community  activity, and yard work  PERSONAL FACTORS: Age, Fitness, Past/current experiences, Time since onset of injury/illness/exacerbation, and 1-2 comorbidities: cadiopulmonary issues, DM, Hx of back surgery  are also affecting patient's functional outcome.   REHAB POTENTIAL: Good  CLINICAL DECISION MAKING: Evolving/moderate complexity  EVALUATION COMPLEXITY: Moderate   GOALS:  SHORT TERM GOALS: Target date: 10/21/23 Pt will be Ind in an initial HEP  Baseline:started Goal status: INITIAL  LONG TERM GOALS: Target date: 11/30/23  Pt will be Ind in a final HEP to maintain achieved LOF  Baseline:  Goal status: INITIAL  2.  Increase bilat hip strength and ankle strength by muscle grade for improved function and balance Baseline: see flow sheet Goal status: INITIAL  3.  Improve 5xSTS by MCID of 5" and by MCID of 70ft as indication of improved functional mobility  Baseline: 5xSTS 21.7 sec 10/13/23: 282 feet  Goal status: ONGOING  4.  Improve Berg balance by 5 pts as indication of improved balance Baseline: TBA 10/13/23: 48/56 Goal status: INITIAL  5.  Increase L LE single leg stand to 5" for improved balance Baseline: L 1", R 5" Goal status: INITIAL  6.  Pt's FOTO score will improved to the predicted value of 50% as indication of improved function  Baseline: 41% Goal status: INITIAL   PLAN:  PT FREQUENCY: 2x/week  PT DURATION: 8 weeks  PLANNED INTERVENTIONS: 97164- PT Re-evaluation, 97110-Therapeutic exercises, 97530- Therapeutic activity, 97112- Neuromuscular re-education, 97535- Self Care, 86578- Manual therapy, [phone]- Gait training, Patient/Family education, Balance training, Stair  training, Joint manipulation, Cryotherapy, and Moist heat  PLAN FOR NEXT SESSION:  Review FOTO; assess response to HEP; progress therex as indicated; use of modalities, manual therapy; and TPDN as indicated.  Allen Ralls MS, PT 10/13/23 12:58 PM   Referring diagnosis?  I50.22 (ICD-10-CM) - Chronic systolic heart failure (HCC)  X52.841 (ICD-10-CM) - Weakness of both lower extremities    Treatment diagnosis? (if different than referring diagnosis) Difficulty in walking, not elsewhere classified, Muscle weakness(generalized), Abnormal posture What was this (referring dx) caused by? []  Surgery []  Fall [x]  Ongoing issue [x]  Arthritis []  Other: ____________  Laterality: []  Rt []  Lt [x]  Both  Check all possible CPT codes:  *CHOOSE 10 OR LESS*    [x]  97110 (Therapeutic Exercise)  []  92507 (SLP Treatment)  []  97112 (Neuro Re-ed)   []  92526 (Swallowing Treatment)   [x]  97116 (Gait Training)   []  [id number] (Cognitive Training, 1st 15 minutes) [x]  97140 (Manual Therapy)   []  97130 (Cognitive Training, each add'l 15 minutes)  [x]  97164 (Re-evaluation)                              []  Other, List CPT Code ____________  [x]  97530 (Therapeutic Activities)     [x]  97535 (Self Care)   []  All codes above (97110 - 97535)  []  97012 (Mechanical Traction)  [x]  97014 (E-stim Unattended)  []  97032 (E-stim manual)  [x]  97033 (Ionto)  [x]  97035 (Ultrasound) []  32440 (Physical Performance Training) [x]  [id number] (Aquatic Therapy) []  97016 (Vasopneumatic Device) []  [id number] (Paraffin) []  97034 (Contrast Bath) []  97597 (Wound Care 1st 20 sq cm) []  97598 (Wound Care each add'l 20 sq cm) []  97760 (Orthotic Fabrication, Fitting, Training Initial) []  [id number] (Prosthetic Management and Training Initial) []  [id number] (Orthotic or Prosthetic Training/ Modification Subsequent)

## 2023-10-17 ENCOUNTER — Ambulatory Visit: Payer: Medicare HMO | Admitting: Physical Therapy

## 2023-10-17 ENCOUNTER — Encounter: Payer: Self-pay | Admitting: Physical Therapy

## 2023-10-17 DIAGNOSIS — M6281 Muscle weakness (generalized): Secondary | ICD-10-CM

## 2023-10-17 DIAGNOSIS — R293 Abnormal posture: Secondary | ICD-10-CM | POA: Diagnosis not present

## 2023-10-17 DIAGNOSIS — R262 Difficulty in walking, not elsewhere classified: Secondary | ICD-10-CM

## 2023-10-17 NOTE — Therapy (Signed)
OUTPATIENT PHYSICAL THERAPY LOWER EXTREMITY TREATMENT   Patient Name: Carl Wood MRN: [id number] DOB:Apr 21, 1941, 82 y.o., adult Today's Date: 10/17/2023  END OF SESSION:  PT End of Session - 10/17/23 1056     Visit Number 3    Number of Visits 17    Date for PT Re-Evaluation 11/30/23    Authorization Type [employer]IC[employer] HMO    PT Start Time 1100    PT Stop Time 1145    PT Time Calculation (min) 45 min             Past Medical History:  Diagnosis Date   CAD (coronary artery disease)    CHF (congestive heart failure) (HCC)    Chronic atrial fibrillation (HCC)    Colon polyps    Diabetes mellitus    Diverticulosis of colon    DIVERTICULOSIS, COLON 10/16/2006   Qualifier: Diagnosis of  By: Cato Mulligan MD, Bruce     Excessive daytime sleepiness 02/19/2016   Hyperlipidemia    Hypertension    MI (mitral incompetence)    Nephrolithiasis    NEPHROLITHIASIS 06/26/2008   Qualifier: Diagnosis of  By: Linna Darner, CMA, Cindy     OSA (obstructive sleep apnea) 02/19/2016   Moderate to severe OSA with an AHI of 25/hr and on CPAP at 8cm H2O   PE (pulmonary embolism)    V-tach [facility])    Past Surgical History:  Procedure Laterality Date   BACK SURGERY     BASAL CELL CARCINOMA EXCISION     nose   BIV UPGRADE N/A 03/28/2017   Procedure: BiV Upgrade;  Surgeon: Duke Salvia, MD;  Location: [facility] INVASIVE CV LAB;  Service: Cardiovascular;  Laterality: N/A;   CARDIAC CATHETERIZATION N/A 01/20/2016   Procedure: Right/Left Heart Cath and Coronary Angiography;  Surgeon: Dolores Patty, MD;  Location: [facility] INVASIVE CV LAB;  Service: Cardiovascular;  Laterality: N/A;   CARDIAC DEFIBRILLATOR PLACEMENT     medtronic virtuoso   CHOLECYSTECTOMY     COLONOSCOPY  12/11/2012   Procedure: COLONOSCOPY;  Surgeon: Louis Meckel, MD;  Location: [facility] ENDOSCOPY;  Service: Endoscopy;  Laterality: N/A;   KNEE ARTHROPLASTY     Patient Active Problem List   Diagnosis Date Noted   Permanent atrial  fibrillation (HCC) 05/10/2023   IVCD (intraventricular conduction defect) 05/10/2023   COPD (chronic obstructive pulmonary disease) (HCC) 12/26/2018   Osteoarthritis of right knee 09/22/2018   History of subdural hematoma 11/21/2017   Frontal headache 10/06/2017   Rhinitis 08/22/2017   Dyspnea on exertion 08/22/2017   Ischemic cardiomyopathy 03/28/2017   Chronic atrial fibrillation (HCC)    OSA (obstructive sleep apnea) 02/19/2016   Chronic systolic heart failure (HCC) 08/06/2015   LBBB (left bundle branch block) 08/06/2015   Carotid artery stenosis 03/17/2015   Former smoker 11/15/2014   Actinic keratosis 11/15/2014   History of cardioembolic cerebrovascular accident (CVA) 12/19/2012   Benign neoplasm of colon 12/11/2012   Implantable cardioverter-defibrillator (ICD) in situ 02/17/2012    ventricular tachycardia-non sustained  02/17/2012   History of skin cancer 07/05/2007   Diabetes mellitus type II, controlled (HCC) 10/16/2006   Hyperlipidemia associated with type 2 diabetes mellitus (HCC) 10/16/2006   Essential hypertension 10/16/2006   CAD (coronary artery disease) 10/16/2006   Chronic pulmonary embolism (HCC) 10/16/2006    PCP: Shelva Majestic, MD   REFERRING PROVIDER: Dolores Patty, MD   REFERRING DIAG:  I50.22 (ICD-10-CM) - Chronic systolic heart failure (HCC)  O96.295 (ICD-10-CM) - Weakness of both lower  extremities    THERAPY DIAG:  Difficulty in walking, not elsewhere classified  Muscle weakness (generalized)  Abnormal posture  Rationale for Evaluation and Treatment: Rehabilitation  ONSET DATE: chronic  SUBJECTIVE:   SUBJECTIVE STATEMENT: I've been working on the stand up sit down exercise. Hip is still painful, I have to wait a few seconds once I stand up before I can walk.    EVAL: Pt reports over several years his legs have become weaker, his balance has decreased, and his walking tolerance decreased. Pt denies falling. Pt will use a  rollator in the community when he knows he will need stand or walk for prolong time frames. Pt reports a Hx of L hip pain which can interfere with his mobility at times.  PERTINENT HISTORY: CAD, chronic Afib, DM, Hx back surgery, Hx CVA, defibrillator  PAIN:  Are you having pain? Yes: NPRS scale: 5/10. Pain range on eval: 0-10/10 Pain location: L hip Pain description: sharp, ache Aggravating factors: Sit to stand, prolonged sitting, standing, and walking Relieving factors: Rest, sleeps in a recliner  PRECAUTIONS: Other: Cardiopulmonary , defibrillator  RED FLAGS: None   WEIGHT BEARING RESTRICTIONS: No  FALLS:  Has patient fallen in last 6 months? No  LIVING ENVIRONMENT: Lives with: lives with their family Lives in: House/apartment No issue with accessing home or mobility within  OCCUPATION: Retired  PLOF: Independent  PATIENT GOALS: Improved leg strength, balance, and activity tolerance   OBJECTIVE:  Note: Objective measures were completed at Evaluation unless otherwise noted.  DIAGNOSTIC FINDINGS: NA  PATIENT SURVEYS:  FOTO: Perceived function   41%, predicted   50%   COGNITION: Overall cognitive status: Within functional limits for tasks assessed     SENSATION: WFL  EDEMA:  NT  MUSCLE LENGTH: Hamstrings: Right NT deg; Left NT deg Thomas test: Right NT deg; Left NT deg  POSTURE: rounded shoulders, forward head, increased lumbar lordosis, increased thoracic kyphosis, posterior pelvic tilt, and flexed trunk   PALPATION: NT  LOWER EXTREMITY MMT:  Active MMT Right eval Left eval  Hip flexion 4 4  Hip extension 3 3  Hip abduction 3+ 3+  Hip adduction    Hip internal rotation    Hip external rotation 4 4  Knee flexion 5 5  Knee extension 5 5  Ankle dorsiflexion 4 3  Ankle plantarflexion 4 3  Ankle inversion    Ankle eversion     (Blank rows = not tested)  LOWER EXTREMITY ROM:  ROM Right eval Left eval  Hip flexion    Hip extension  Decreased Decreased  Hip abduction Decreased Decreased  Hip adduction    Hip internal rotation Decreased Decreased  Hip external rotation Decreased Decreased  Knee flexion    Knee extension    Ankle dorsiflexion Decreased Decreased  Ankle plantarflexion    Ankle inversion    Ankle eversion     (Blank rows = not tested)  LOWER EXTREMITY SPECIAL TESTS:  Hip special tests: Luisa Hart (FABER) test: negative  FUNCTIONAL TESTS:  5 times sit to stand: 20.7" c use of hands 2 minute walk test: TBA: 10/13/23: 282 feet with SPC Single leg stand: L=1", R=5" Berg Balance: 48/56 10/13/23  GAIT: Distance walked: 150' Assistive device utilized: None Level of assistance: Complete Independence Comments: Decreased step length, decreased ankle DF,    TODAY'S TREATMENT:      [facility] Adult PT Treatment:  DATE: 10/17/23 Therapeutic Exercise: Nustep L5 UE/Le x 5 min Side stepping at counter x 3 , added yellow band x 3  Standing hip ext yellow band x 10 each  Sink squat x 10  Standing March with 1 UE support  Tandem stance on AIREX 10-15 sec without UE x 4  A/P rocking on blue Rocker board without UE x 1 minute  STS from elevated seat x 8 - needs UE to rise Knee ext 10# x 10 bilat - pain in left hip so disc Supine with incline wedge: LTR ball bet knees x 10  AROM clam x 10 Partial Bridge x 10+ HEP   [facility] Adult PT Treatment:                                                DATE: 10/13/23  Therapeutic Activity:  2 MWT 282 feet STS from elevated seat x 6 Side stepping along counter 3 x down and back Tandem stance 45 sec- cues for posture SLS trials 5-10 sec with intermittent touch                                                                                                                           BERG BALANCE TEST Sitting to Standing: 3.      Stands independently using hands Standing Unsupported: 4.      Stands safely for 2 minutes Sitting  Unsupported: 4.     Sits for 2 minutes independently Standing to Sitting: 3.     Controls descent with hands  Transfers: 3.     Transfers safely definite use of hands Standing with eyes closed: 4.     Stands safely for 10 seconds  Standing with feet together: 4.     Stands for 1 minute safely Reaching forward with outstretched arm: 4.     Reaches forward 10 inches Retrieving object from the floor: 4.      Able to pick up easily and safely Turning to look behind: 4.     Looks behind from both sides and weight shifts well Turning 360 degrees: 2.     Able to turn slowly, but safely Place alternate foot on stool: 4.     Completes 8 steps in 20 seconds     Standing with one foot in front: 3.     Independent foot ahead for 30 seconds Standing on one foot: 2.     Holds >/=3 seconds Total Score: 48/56   [facility] Adult PT Treatment:                                                DATE: 09/27/23 Therapeutic Exercise: Developed, instructed in, and pt completed  therex as noted in HEP   PATIENT EDUCATION:  Education details: Eval findings, POC, HEP Person educated: Patient Education method: Explanation, Demonstration, Tactile cues, Verbal cues, and Handouts Education comprehension: verbalized understanding, returned demonstration, verbal cues required, and tactile cues required  HOME EXERCISE PROGRAM: Access Code: [email or web address] URL: [email or web address] Date: 09/27/2023 Prepared by: Joellyn Rued  Exercises - Sit to Stand with Counter Support  - 2-3 x daily - 7 x weekly - 3 sets - 10 reps - Standing Single Leg Stance with Counter Support  - 2-3 x daily - 7 x weekly - 3 sets - 5 reps - 20 hold - Standing Tandem Balance with Counter Support  - 1 x daily - 7 x weekly - 1 sets - 3 reps - 30 hold - Side Stepping with Counter Support  - 1 x daily - 7 x weekly - 1 sets - 3 reps  ASSESSMENT:  CLINICAL IMPRESSION: Pt reports increased compliance with STS at home,  however still not performing  entire HEP daily. Pt is limited during session due to hip pain. He is unable to lay supine without wedge due to hip pain, sleeps in a recliner due to hip pain. Continued with LE strength and balance today as tolerated. Requires to stand for several seconds after rising, before he can start to walk. Discussed following up with MD regarding hip pain which may provide some treatment to allow improved tolerance to therex.    EVAL: Patient is a 82 y.o. male who was seen today for physical therapy evaluation and treatment for  I50.22 (ICD-10-CM) - Chronic systolic heart failure (HCC)  G40.102 (ICD-10-CM) - Weakness of both lower extremities  .Pt presents to PT with decreased LE strength, decreased balance, decreased activity tolerance, postural changes impacting COG and balance, L hip pain, and decreased tolerance to activity. Pt will benefit from skilled PT to address impairments to optimize functional mobility.   OBJECTIVE IMPAIRMENTS: decreased activity tolerance, decreased balance, decreased endurance, difficulty walking, decreased ROM, decreased strength, postural dysfunction, and pain.   ACTIVITY LIMITATIONS: carrying, lifting, bending, standing, squatting, sleeping, stairs, locomotion level, and caring for others  PARTICIPATION LIMITATIONS: meal prep, cleaning, laundry, driving, shopping, community activity, and yard work  PERSONAL FACTORS: Age, Fitness, Past/current experiences, Time since onset of injury/illness/exacerbation, and 1-2 comorbidities: cadiopulmonary issues, DM, Hx of back surgery  are also affecting patient's functional outcome.   REHAB POTENTIAL: Good  CLINICAL DECISION MAKING: Evolving/moderate complexity  EVALUATION COMPLEXITY: Moderate   GOALS:  SHORT TERM GOALS: Target date: 10/21/23 Pt will be Ind in an initial HEP  Baseline:started Goal status: ONGOING  LONG TERM GOALS: Target date: 11/30/23  Pt will be Ind in a final HEP to maintain achieved LOF  Baseline:   Goal status: INITIAL  2.  Increase bilat hip strength and ankle strength by muscle grade for improved function and balance Baseline: see flow sheet Goal status: INITIAL  3.  Improve 5xSTS by MCID of 5" and by MCID of 78ft as indication of improved functional mobility  Baseline: 5xSTS 21.7 sec 10/13/23: 282 feet  Goal status: ONGOING  4.  Improve Berg balance by 5 pts as indication of improved balance Baseline: TBA 10/13/23: 48/56 Goal status: INITIAL  5.  Increase L LE single leg stand to 5" for improved balance Baseline: L 1", R 5" Goal status: INITIAL  6.  Pt's FOTO score will improved to the predicted value of 50% as indication of improved function  Baseline: 41% Goal status: INITIAL  PLAN:  PT FREQUENCY: 2x/week  PT DURATION: 8 weeks  PLANNED INTERVENTIONS: 97164- PT Re-evaluation, 97110-Therapeutic exercises, 97530- Therapeutic activity, 97112- Neuromuscular re-education, 97535- Self Care, 40981- Manual therapy, [id number]- Gait training, Patient/Family education, Balance training, Stair training, Joint manipulation, Cryotherapy, and Moist heat  PLAN FOR NEXT SESSION:  Review FOTO; assess response to HEP; progress therex as indicated; use of modalities, manual therapy; and TPDN as indicated.  Allen Ralls MS, PT 10/17/23 1:00 PM   Referring diagnosis?  I50.22 (ICD-10-CM) - Chronic systolic heart failure (HCC)  W29.562 (ICD-10-CM) - Weakness of both lower extremities    Treatment diagnosis? (if different than referring diagnosis) Difficulty in walking, not elsewhere classified, Muscle weakness(generalized), Abnormal posture What was this (referring dx) caused by? []  Surgery []  Fall [x]  Ongoing issue [x]  Arthritis []  Other: ____________  Laterality: []  Rt []  Lt [x]  Both  Check all possible CPT codes:  *CHOOSE 10 OR LESS*    [x]  13086 (Therapeutic Exercise)  []  92507 (SLP Treatment)  []  97112 (Neuro Re-ed)   []  92526 (Swallowing Treatment)   [x]  97116  (Gait Training)   []  [id number] (Cognitive Training, 1st 15 minutes) [x]  97140 (Manual Therapy)   []  97130 (Cognitive Training, each add'l 15 minutes)  [x]  97164 (Re-evaluation)                              []  Other, List CPT Code ____________  [x]  97530 (Therapeutic Activities)     [x]  97535 (Self Care)   []  All codes above (97110 - 97535)  []  97012 (Mechanical Traction)  [x]  97014 (E-stim Unattended)  []  97032 (E-stim manual)  [x]  97033 (Ionto)  [x]  97035 (Ultrasound) []  97750 (Physical Performance Training) [x]  [id number] (Aquatic Therapy) []  97016 (Vasopneumatic Device) []  [id number] (Paraffin) []  97034 (Contrast Bath) []  97597 (Wound Care 1st 20 sq cm) []  97598 (Wound Care each add'l 20 sq cm) []  97760 (Orthotic Fabrication, Fitting, Training Initial) []  [id number] (Prosthetic Management and Training Initial) []  [id number] (Orthotic or Prosthetic Training/ Modification Subsequent)

## 2023-10-18 ENCOUNTER — Ambulatory Visit (INDEPENDENT_AMBULATORY_CARE_PROVIDER_SITE_OTHER): Payer: Medicare HMO

## 2023-10-18 ENCOUNTER — Other Ambulatory Visit: Payer: Self-pay

## 2023-10-18 ENCOUNTER — Ambulatory Visit: Payer: Medicare HMO | Admitting: Sports Medicine

## 2023-10-18 VITALS — HR 102 | Ht 72.0 in | Wt 195.0 lb

## 2023-10-18 DIAGNOSIS — M1612 Unilateral primary osteoarthritis, left hip: Secondary | ICD-10-CM

## 2023-10-18 DIAGNOSIS — M25552 Pain in left hip: Secondary | ICD-10-CM

## 2023-10-18 DIAGNOSIS — M160 Bilateral primary osteoarthritis of hip: Secondary | ICD-10-CM | POA: Diagnosis not present

## 2023-10-18 NOTE — Patient Instructions (Signed)
Thank you for coming in today  You may use Tylenol 500 to 1000 mg tablets 2-3 times a day for day-to-day pain relief You may continue physical therapy as tolerated  Follow-up as needed if no improvement over the next 2 to 4 weeks.

## 2023-10-18 NOTE — Progress Notes (Signed)
Aleen Sells D.[facility] [address] [place] [address] Phone: [phone]   Assessment and Plan:     1. Left hip pain 2. Primary osteoarthritis of left hip  -Chronic with exacerbation, initial sports medicine visit - Consistent with flare of osteoarthritis patient HPI, physical exam, x-ray imaging - X-ray obtained in clinic.  My interpretation: No acute fracture or dislocation.  Femoral acetabular degenerative changes.  Degenerative changes along the greater trochanter and ischium. - Do not recommend NSAID or prednisone use due to past medical history including chronic atrial fibrillation on anticoagulation with Xarelto - Patient elected for intra-articular hip CSI.  Tolerated well per note below.  Increased risk of bleeding due to chronic anticoagulation on Xarelto - Patient is already started physical therapy for lower extremity strengthening.  May continue PT as tolerated  Procedure: Ultrasound Guided Hip Acetabulofemoral Joint Injection Side: Left Diagnosis: Flare of osteoarthritis [place] Indication:  - accuracy is paramount for diagnosis - to ensure therapeutic efficacy or procedural success - to reduce procedural risk  After explaining the procedure, viable alternatives, risks, and answering any questions, consent was given verbally. The site was cleaned with chlorhexidine prep. An ultrasound transducer was placed on the anterior thigh/hip.   The acetabular joint, labrum, and femoral shaft were identified.  The neurovascular structures were identified and an approach was found specifically avoiding these structures.  A steroid injection was performed under ultrasound guidance with sterile technique using 2ml of 1% lidocaine without epinephrine and 40 mg of triamcinolone (KENALOG) 40mg /ml. This was well tolerated and resulted in  relief.  Needle was removed and dressing placed and post injection instructions were given including  a discussion  of likely return of pain today after the anesthetic wears off (with the possibility of worsened pain) until the steroid starts to work in 1-3 days.   Pt was advised to call or return to clinic if these symptoms worsen or fail to improve as anticipated.   Pertinent previous records reviewed include none  Follow Up: As needed if no improvement or worsening of symptoms   Subjective:   I, Moenique Parris, am serving as a Neurosurgeon for Doctor Richardean Sale  Chief Complaint: hip pain   HPI:   10/18/23 Patient is a 82 year old male with concerns of hip pain. Patient states that he has hip pain for a year that has been getting worse. Pain radiates to the groin. No meds for the pain. He gets shooting pain when he turns. No numbness or tingling. Pain is worse when he is sitting, and standing.   Relevant Historical Information: CAD, history of PE, chronic atrial fibrillation on chronic anticoagulation with Xarelto, COPD, DM type II  Additional pertinent review of systems negative.   Current Outpatient Medications:    Ascorbic Acid (VITAMIN C ER PO), Take 1 tablet by mouth daily., Disp: , Rfl:    blood glucose meter kit and supplies KIT, Dispense based on patient and insurance preference. Use up to four times daily as directed., Disp: 1 each, Rfl: 0   cetirizine (ZYRTEC) 10 MG tablet, Take 10 mg by mouth daily., Disp: , Rfl:    digoxin (LANOXIN) 0.25 MG tablet, Take 0.5 tablets (0.125 mg total) by mouth daily., Disp: 45 tablet, Rfl: 3   empagliflozin (JARDIANCE) 10 MG TABS tablet, Take 1 tablet (10 mg total) by mouth daily., Disp: 90 tablet, Rfl: 3   furosemide (LASIX) 20 MG tablet, Take 1 tablet by mouth once a  week, Disp: 12 tablet, Rfl: 0   glimepiride (AMARYL) 4 MG tablet, TAKE 2 TABLETS BY MOUTH ONCE DAILY WITH BREAKFAST, Disp: 180 tablet, Rfl: 0   glucose blood (ACCU-CHEK GUIDE) test strip, USE UP TO FOUR TIMES DAILY Dx: E11.9, Disp: 450 each, Rfl: 3   metFORMIN (GLUCOPHAGE-XR) 500 MG 24 hr  tablet, TAKE 2 TABLETS BY MOUTH IN THE MORNING AND 2 AT BEDTIME, Disp: 360 tablet, Rfl: 0   metoprolol succinate (TOPROL XL) 25 MG 24 hr tablet, Take 1 tablet (25 mg total) by mouth at bedtime., Disp: 90 tablet, Rfl: 2   rivaroxaban (XARELTO) 20 MG TABS tablet, Take 1 tablet by mouth once daily, Disp: 90 tablet, Rfl: 2   rosuvastatin (CRESTOR) 40 MG tablet, Take 1 tablet by mouth once daily, Disp: 90 tablet, Rfl: 0   sacubitril-valsartan (ENTRESTO) 24-26 MG, Take 1 tablet by mouth 2 (two) times daily., Disp: 180 tablet, Rfl: 3   Turmeric 500 MG CAPS, Take by mouth. 1 daily, Disp: , Rfl:    Objective:     Vitals:   10/18/23 1402  Pulse: (!) 102  SpO2: 98%  Weight: 195 lb (88.5 kg)  Height: 6' (1.829 m)      Body mass index is 26.45 kg/m.    Physical Exam:    General: awake, alert, and oriented no acute distress, nontoxic Skin: no suspicious lesions or rashes Neuro:sensation intact distally with no deficits, normal muscle tone, no atrophy, strength 5/5 in all tested lower ext groups Psych: normal mood and affect, speech clear   Left hip: No deformity, swelling or wasting ROM Flexion 80, ext 20, IR 35, ER 40 NTTP over the hip flexors, greater trochanter, gluteal musculature, si joint, lumbar spine Positive log roll with FROM Positive FABER Positive FADIR   Gait antalgic, favoring right leg and using single prong cane   Electronically signed by:  Aleen Sells D.[facility] 2:30 PM 10/18/23

## 2023-10-19 NOTE — Therapy (Signed)
OUTPATIENT PHYSICAL THERAPY LOWER EXTREMITY TREATMENT   Patient Name: Carl Wood MRN: [id number] DOB:1941-02-03, 82 y.o., adult Today's Date: 10/20/2023  END OF SESSION:  PT End of Session - 10/20/23 1112     Visit Number 4    Number of Visits 17    Date for PT Re-Evaluation 11/30/23    Authorization Type [employer]    Authorization Time Period Approved 10 PT visits from 10/10/23-11/30/23    Authorization - Visit Number 3    Authorization - Number of Visits 10    PT Start Time 1100    PT Stop Time 1145    PT Time Calculation (min) 45 min    Activity Tolerance Patient tolerated treatment well    Behavior During Therapy WFL for tasks assessed/performed              Past Medical History:  Diagnosis Date   CAD (coronary artery disease)    CHF (congestive heart failure) (HCC)    Chronic atrial fibrillation (HCC)    Colon polyps    Diabetes mellitus    Diverticulosis of colon    DIVERTICULOSIS, COLON 10/16/2006   Qualifier: Diagnosis of  By: Cato Mulligan MD, Bruce     Excessive daytime sleepiness 02/19/2016   Hyperlipidemia    Hypertension    MI (mitral incompetence)    Nephrolithiasis    NEPHROLITHIASIS 06/26/2008   Qualifier: Diagnosis of  By: Linna Darner, CMA, Cindy     OSA (obstructive sleep apnea) 02/19/2016   Moderate to severe OSA with an AHI of 25/hr and on CPAP at 8cm H2O   PE (pulmonary embolism)    [facility])    Past Surgical History:  Procedure Laterality Date   BACK SURGERY     BASAL CELL CARCINOMA EXCISION     nose   BIV UPGRADE N/A 03/28/2017   Procedure: BiV Upgrade;  Surgeon: Duke Salvia, MD;  Location: [facility] INVASIVE CV LAB;  Service: Cardiovascular;  Laterality: N/A;   CARDIAC CATHETERIZATION N/A 01/20/2016   Procedure: Right/Left Heart Cath and Coronary Angiography;  Surgeon: Dolores Patty, MD;  Location: [facility] INVASIVE CV LAB;  Service: Cardiovascular;  Laterality: N/A;   CARDIAC DEFIBRILLATOR PLACEMENT     medtronic virtuoso    CHOLECYSTECTOMY     COLONOSCOPY  12/11/2012   Procedure: COLONOSCOPY;  Surgeon: Louis Meckel, MD;  Location: [facility] ENDOSCOPY;  Service: Endoscopy;  Laterality: N/A;   KNEE ARTHROPLASTY     Patient Active Problem List   Diagnosis Date Noted   Permanent atrial fibrillation (HCC) 05/10/2023   IVCD (intraventricular conduction defect) 05/10/2023   COPD (chronic obstructive pulmonary disease) (HCC) 12/26/2018   Osteoarthritis of right knee 09/22/2018   History of subdural hematoma 11/21/2017   Frontal headache 10/06/2017   Rhinitis 08/22/2017   Dyspnea on exertion 08/22/2017   Ischemic cardiomyopathy 03/28/2017   Chronic atrial fibrillation (HCC)    OSA (obstructive sleep apnea) 02/19/2016   Chronic systolic heart failure (HCC) 08/06/2015   LBBB (left bundle branch block) 08/06/2015   Carotid artery stenosis 03/17/2015   Former smoker 11/15/2014   Actinic keratosis 11/15/2014   History of cardioembolic cerebrovascular accident (CVA) 12/19/2012   Benign neoplasm of colon 12/11/2012   Implantable cardioverter-defibrillator (ICD) in situ 02/17/2012    ventricular tachycardia-non sustained  02/17/2012   History of skin cancer 07/05/2007   Diabetes mellitus type II, controlled (HCC) 10/16/2006   Hyperlipidemia associated with type 2 diabetes mellitus (HCC) 10/16/2006   Essential hypertension 10/16/2006  CAD (coronary artery disease) 10/16/2006   Chronic pulmonary embolism (HCC) 10/16/2006    PCP: Shelva Majestic, MD   REFERRING PROVIDER: Dolores Patty, MD   REFERRING DIAG:  I50.22 (ICD-10-CM) - Chronic systolic heart failure (HCC)  G40.102 (ICD-10-CM) - Weakness of both lower extremities    THERAPY DIAG:  Difficulty in walking, not elsewhere classified  Muscle weakness (generalized)  Abnormal posture  Rationale for Evaluation and Treatment: Rehabilitation  ONSET DATE: chronic  SUBJECTIVE:   SUBJECTIVE STATEMENT: Pt reports receiving an injectionmforhis L hip  on Tuesday and it is feeling some better. He notes his L knee is hurting today.  EVAL: Pt reports over several years his legs have become weaker, his balance has decreased, and his walking tolerance decreased. Pt denies falling. Pt will use a rollator in the community when he knows he will need stand or walk for prolong time frames. Pt reports a Hx of L hip pain which can interfere with his mobility at times.  PERTINENT HISTORY: CAD, chronic Afib, DM, Hx back surgery, Hx CVA, defibrillator  PAIN:  Are you having pain? Yes: NPRS scale: 3/10. Pain range on eval: 0-10/10. L knee: Pain location: L hip Pain description: sharp, ache Aggravating factors: Sit to stand, prolonged sitting, standing, and walking Relieving factors: Rest, sleeps in a recliner  PRECAUTIONS: Other: Cardiopulmonary , defibrillator  RED FLAGS: None   WEIGHT BEARING RESTRICTIONS: No  FALLS:  Has patient fallen in last 6 months? No  LIVING ENVIRONMENT: Lives with: lives with their family Lives in: House/apartment No issue with accessing home or mobility within  OCCUPATION: Retired  PLOF: Independent  PATIENT GOALS: Improved leg strength, balance, and activity tolerance   OBJECTIVE:  Note: Objective measures were completed at Evaluation unless otherwise noted.  DIAGNOSTIC FINDINGS: NA  PATIENT SURVEYS:  FOTO: Perceived function   41%, predicted   50%   COGNITION: Overall cognitive status: Within functional limits for tasks assessed     SENSATION: WFL  EDEMA:  NT  MUSCLE LENGTH: Hamstrings: Right NT deg; Left NT deg Thomas test: Right NT deg; Left NT deg  POSTURE: rounded shoulders, forward head, increased lumbar lordosis, increased thoracic kyphosis, posterior pelvic tilt, and flexed trunk   PALPATION: NT  LOWER EXTREMITY MMT:  Active MMT Right eval Left eval  Hip flexion 4 4  Hip extension 3 3  Hip abduction 3+ 3+  Hip adduction    Hip internal rotation    Hip external rotation 4 4   Knee flexion 5 5  Knee extension 5 5  Ankle dorsiflexion 4 3  Ankle plantarflexion 4 3  Ankle inversion    Ankle eversion     (Blank rows = not tested)  LOWER EXTREMITY ROM:  ROM Right eval Left eval  Hip flexion    Hip extension Decreased Decreased  Hip abduction Decreased Decreased  Hip adduction    Hip internal rotation Decreased Decreased  Hip external rotation Decreased Decreased  Knee flexion    Knee extension    Ankle dorsiflexion Decreased Decreased  Ankle plantarflexion    Ankle inversion    Ankle eversion     (Blank rows = not tested)  LOWER EXTREMITY SPECIAL TESTS:  Hip special tests: Luisa Hart (FABER) test: negative  FUNCTIONAL TESTS:  5 times sit to stand: 20.7" c use of hands 2 minute walk test: TBA: 10/13/23: 282 feet with SPC Single leg stand: L=1", R=5" Berg Balance: 48/56 10/13/23  GAIT: Distance walked: 150' Assistive device utilized: None Level  of assistance: Complete Independence Comments: Decreased step length, decreased ankle DF,    TODAY'S TREATMENT:  [facility] Adult PT Treatment:                                                DATE: 10/20/23 Therapeutic Exercise: Nustep L5 UE/LE x 5 min Marching on airex x2 30' Tandem stance on AIREX 10-15 sec without UE x 4  Supine marching x15 each Bridge x15 H/L hip clam 2x10 GTB Sink squat x 10  LAQ x15 4# Updated HEP Manual Therapy: Grade 3 L hip LAD      [facility] Adult PT Treatment:                                                DATE: 10/17/23 Therapeutic Exercise: Nustep L5 UE/Le x 5 min Side stepping at counter x 3 , added yellow band x 3  Standing hip ext yellow band x 10 each  Sink squat x 10  Standing March with 1 UE support  Tandem stance on AIREX 10-15 sec without UE x 4  A/P rocking on blue Rocker board without UE x 1 minute  STS from elevated seat x 8 - needs UE to rise Knee ext 10# x 10 bilat - pain in left hip so disc Supine with incline wedge: LTR ball bet knees x 10  AROM clam x  10 Partial Bridge x 10+ HEP  [facility] Adult PT Treatment:                                                DATE: 10/13/23 Therapeutic Activity:  2 MWT 282 feet STS from elevated seat x 6 Side stepping along counter 3 x down and back Tandem stance 45 sec- cues for posture SLS trials 5-10 sec with intermittent touch                                                                                                                           BERG BALANCE TEST Sitting to Standing: 3.      Stands independently using hands Standing Unsupported: 4.      Stands safely for 2 minutes Sitting Unsupported: 4.     Sits for 2 minutes independently Standing to Sitting: 3.     Controls descent with hands  Transfers: 3.     Transfers safely definite use of hands Standing with eyes closed: 4.     Stands safely for 10 seconds  Standing with feet together: 4.     Stands for 1 minute safely Reaching forward with  outstretched arm: 4.     Reaches forward 10 inches Retrieving object from the floor: 4.      Able to pick up easily and safely Turning to look behind: 4.     Looks behind from both sides and weight shifts well Turning 360 degrees: 2.     Able to turn slowly, but safely Place alternate foot on stool: 4.     Completes 8 steps in 20 seconds     Standing with one foot in front: 3.     Independent foot ahead for 30 seconds Standing on one foot: 2.     Holds >/=3 seconds Total Score: 48/56   [facility] Adult PT Treatment:                                                DATE: 09/27/23 Therapeutic Exercise: Developed, instructed in, and pt completed therex as noted in HEP   PATIENT EDUCATION:  Education details: Eval findings, POC, HEP Person educated: Patient Education method: Explanation, Demonstration, Tactile cues, Verbal cues, and Handouts Education comprehension: verbalized understanding, returned demonstration, verbal cues required, and tactile cues required  HOME EXERCISE PROGRAM: Access Code:  [email or web address] URL: [email or web address] Date: 10/20/2023 Prepared by: Joellyn Rued  Exercises - Standing Single Leg Stance with Counter Support  - 2-3 x daily - 7 x weekly - 1 sets - 5 reps - 20 hold - Standing Tandem Balance with Counter Support  - 1 x daily - 7 x weekly - 1 sets - 3 reps - 30 hold - Side Stepping with Counter Support  - 1 x daily - 7 x weekly - 1 sets - 3 reps - Mini Squat with Counter Support  - 1 x daily - 7 x weekly - 1-2 sets - 10 reps - 3 hold - Supine Bridge  - 1 x daily - 7 x weekly - 1-2 sets - 10 reps - 5 hold - Hooklying Isometric Clamshell  - 1 x daily - 7 x weekly - 2 sets - 10 reps - 3 hold  ASSESSMENT:  CLINICAL IMPRESSION: Pt was completed for LE strengthening and balance. With pt experiencing L hip pain consistently c STS, options for bridging and sink squats were provided to minimize L hip pain with wt bearing LE strengthening. HEP was updated. Completed LAD mobs for the L hip to assess benefit for pain relief. Pt did not notice nay reducing of pain. Pt tolerated PT today without adverse effects. Pt will continue to benefit from skilled PT to address impairments for improved functional mobility.   EVAL: Patient is a 82 y.o. male who was seen today for physical therapy evaluation and treatment for  I50.22 (ICD-10-CM) - Chronic systolic heart failure (HCC)  F64.332 (ICD-10-CM) - Weakness of both lower extremities  .Pt presents to PT with decreased LE strength, decreased balance, decreased activity tolerance, postural changes impacting COG and balance, L hip pain, and decreased tolerance to activity. Pt will benefit from skilled PT to address impairments to optimize functional mobility.   OBJECTIVE IMPAIRMENTS: decreased activity tolerance, decreased balance, decreased endurance, difficulty walking, decreased ROM, decreased strength, postural dysfunction, and pain.   ACTIVITY LIMITATIONS: carrying, lifting, bending, standing, squatting, sleeping,  stairs, locomotion level, and caring for others  PARTICIPATION LIMITATIONS: meal prep, cleaning, laundry, driving, shopping, community activity, and yard work  PERSONAL FACTORS: Age, Fitness, Past/current experiences,  Time since onset of injury/illness/exacerbation, and 1-2 comorbidities: cadiopulmonary issues, DM, Hx of back surgery  are also affecting patient's functional outcome.   REHAB POTENTIAL: Good  CLINICAL DECISION MAKING: Evolving/moderate complexity  EVALUATION COMPLEXITY: Moderate   GOALS:  SHORT TERM GOALS: Target date: 10/21/23 Pt will be Ind in an initial HEP  Baseline:started Goal status: ONGOING  LONG TERM GOALS: Target date: 11/30/23  Pt will be Ind in a final HEP to maintain achieved LOF  Baseline:  Goal status: INITIAL  2.  Increase bilat hip strength and ankle strength by muscle grade for improved function and balance Baseline: see flow sheet Goal status: INITIAL  3.  Improve 5xSTS by MCID of 5" and by MCID of 72ft as indication of improved functional mobility  Baseline: 5xSTS 21.7 sec 10/13/23: 282 feet  Goal status: ONGOING  4.  Improve Berg balance by 5 pts as indication of improved balance Baseline: TBA 10/13/23: 48/56 Goal status: INITIAL  5.  Increase L LE single leg stand to 5" for improved balance Baseline: L 1", R 5" Goal status: INITIAL  6.  Pt's FOTO score will improved to the predicted value of 50% as indication of improved function  Baseline: 41% Goal status: INITIAL   PLAN:  PT FREQUENCY: 2x/week  PT DURATION: 8 weeks  PLANNED INTERVENTIONS: [id number]- PT Re-evaluation, 97110-Therapeutic exercises, 97530- Therapeutic activity, 97112- Neuromuscular re-education, 97535- Self Care, 62130- Manual therapy, [phone]- Gait training, Patient/Family education, Balance training, Stair training, Joint manipulation, Cryotherapy, and Moist heat  PLAN FOR NEXT SESSION:  Review FOTO; assess response to HEP; progress therex as indicated; use of  modalities, manual therapy; and TPDN as indicated.  Carlon Davidson MS, PT 10/20/23 12:34 PM

## 2023-10-20 ENCOUNTER — Ambulatory Visit: Payer: Medicare HMO

## 2023-10-20 DIAGNOSIS — R262 Difficulty in walking, not elsewhere classified: Secondary | ICD-10-CM | POA: Diagnosis not present

## 2023-10-20 DIAGNOSIS — M6281 Muscle weakness (generalized): Secondary | ICD-10-CM

## 2023-10-20 DIAGNOSIS — R293 Abnormal posture: Secondary | ICD-10-CM | POA: Diagnosis not present

## 2023-10-24 ENCOUNTER — Encounter: Payer: Self-pay | Admitting: Physical Therapy

## 2023-10-24 ENCOUNTER — Ambulatory Visit: Payer: Medicare HMO | Admitting: Physical Therapy

## 2023-10-24 DIAGNOSIS — R293 Abnormal posture: Secondary | ICD-10-CM

## 2023-10-24 DIAGNOSIS — M6281 Muscle weakness (generalized): Secondary | ICD-10-CM

## 2023-10-24 DIAGNOSIS — R262 Difficulty in walking, not elsewhere classified: Secondary | ICD-10-CM

## 2023-10-24 NOTE — Therapy (Signed)
OUTPATIENT PHYSICAL THERAPY LOWER EXTREMITY TREATMENT   Patient Name: Carl Wood MRN: [id number] DOB:04/29/1941, 82 y.o., adult Today's Date: 10/24/2023  END OF SESSION:  PT End of Session - 10/24/23 1104     Visit Number 5    Number of Visits 17    Date for PT Re-Evaluation 11/30/23    Authorization Type [employer]    Authorization Time Period Approved 10 PT visits from 10/10/23-11/30/23    Authorization - Visit Number 4    Authorization - Number of Visits 10    PT Start Time 1103    PT Stop Time 1145    PT Time Calculation (min) 42 min              Past Medical History:  Diagnosis Date   CAD (coronary artery disease)    CHF (congestive heart failure) (HCC)    Chronic atrial fibrillation (HCC)    Colon polyps    Diabetes mellitus    Diverticulosis of colon    DIVERTICULOSIS, COLON 10/16/2006   Qualifier: Diagnosis of  By: Cato Mulligan MD, Bruce     Excessive daytime sleepiness 02/19/2016   Hyperlipidemia    Hypertension    MI (mitral incompetence)    Nephrolithiasis    NEPHROLITHIASIS 06/26/2008   Qualifier: Diagnosis of  By: Linna Darner, CMA, Cindy     OSA (obstructive sleep apnea) 02/19/2016   Moderate to severe OSA with an AHI of 25/hr and on CPAP at 8cm H2O   PE (pulmonary embolism)    V-tach [facility])    Past Surgical History:  Procedure Laterality Date   BACK SURGERY     BASAL CELL CARCINOMA EXCISION     nose   BIV UPGRADE N/A 03/28/2017   Procedure: BiV Upgrade;  Surgeon: Duke Salvia, MD;  Location: [facility] INVASIVE CV LAB;  Service: Cardiovascular;  Laterality: N/A;   CARDIAC CATHETERIZATION N/A 01/20/2016   Procedure: Right/Left Heart Cath and Coronary Angiography;  Surgeon: Dolores Patty, MD;  Location: [facility] INVASIVE CV LAB;  Service: Cardiovascular;  Laterality: N/A;   CARDIAC DEFIBRILLATOR PLACEMENT     medtronic virtuoso   CHOLECYSTECTOMY     COLONOSCOPY  12/11/2012   Procedure: COLONOSCOPY;  Surgeon: Louis Meckel, MD;  Location: [facility]  ENDOSCOPY;  Service: Endoscopy;  Laterality: N/A;   KNEE ARTHROPLASTY     Patient Active Problem List   Diagnosis Date Noted   Permanent atrial fibrillation (HCC) 05/10/2023   IVCD (intraventricular conduction defect) 05/10/2023   COPD (chronic obstructive pulmonary disease) (HCC) 12/26/2018   Osteoarthritis of right knee 09/22/2018   History of subdural hematoma 11/21/2017   Frontal headache 10/06/2017   Rhinitis 08/22/2017   Dyspnea on exertion 08/22/2017   Ischemic cardiomyopathy 03/28/2017   Chronic atrial fibrillation (HCC)    OSA (obstructive sleep apnea) 02/19/2016   Chronic systolic heart failure (HCC) 08/06/2015   LBBB (left bundle branch block) 08/06/2015   Carotid artery stenosis 03/17/2015   Former smoker 11/15/2014   Actinic keratosis 11/15/2014   History of cardioembolic cerebrovascular accident (CVA) 12/19/2012   Benign neoplasm of colon 12/11/2012   Implantable cardioverter-defibrillator (ICD) in situ 02/17/2012    ventricular tachycardia-non sustained  02/17/2012   History of skin cancer 07/05/2007   Diabetes mellitus type II, controlled (HCC) 10/16/2006   Hyperlipidemia associated with type 2 diabetes mellitus (HCC) 10/16/2006   Essential hypertension 10/16/2006   CAD (coronary artery disease) 10/16/2006   Chronic pulmonary embolism (HCC) 10/16/2006    PCP: Shelva Majestic,  MD   REFERRING PROVIDER: Dolores Patty, MD   REFERRING DIAG:  I50.22 (ICD-10-CM) - Chronic systolic heart failure (HCC)  O96.295 (ICD-10-CM) - Weakness of both lower extremities    THERAPY DIAG:  Difficulty in walking, not elsewhere classified  Muscle weakness (generalized)  Abnormal posture  Rationale for Evaluation and Treatment: Rehabilitation  ONSET DATE: chronic  SUBJECTIVE:   SUBJECTIVE STATEMENT: Pt reports injection did not help his hip. Knee is better today.  He reports compliance with HEP 1 x per day , at the least.   EVAL: Pt reports over several  years his legs have become weaker, his balance has decreased, and his walking tolerance decreased. Pt denies falling. Pt will use a rollator in the community when he knows he will need stand or walk for prolong time frames. Pt reports a Hx of L hip pain which can interfere with his mobility at times.  PERTINENT HISTORY: CAD, chronic Afib, DM, Hx back surgery, Hx CVA, defibrillator  PAIN:  Are you having pain? Yes: NPRS scale: 5/10 sharp intermittent pains Pain location: L hip Pain description: sharp, ache Aggravating factors: Sit to stand, prolonged sitting, standing, and walking Relieving factors: Rest, sleeps in a recliner  PRECAUTIONS: Other: Cardiopulmonary , defibrillator  RED FLAGS: None   WEIGHT BEARING RESTRICTIONS: No  FALLS:  Has patient fallen in last 6 months? No  LIVING ENVIRONMENT: Lives with: lives with their family Lives in: House/apartment No issue with accessing home or mobility within  OCCUPATION: Retired  PLOF: Independent  PATIENT GOALS: Improved leg strength, balance, and activity tolerance   OBJECTIVE:  Note: Objective measures were completed at Evaluation unless otherwise noted.  DIAGNOSTIC FINDINGS: NA  PATIENT SURVEYS:  FOTO: Perceived function   41%, predicted   50%   COGNITION: Overall cognitive status: Within functional limits for tasks assessed     SENSATION: WFL  EDEMA:  NT  MUSCLE LENGTH: Hamstrings: Right NT deg; Left NT deg Thomas test: Right NT deg; Left NT deg  POSTURE: rounded shoulders, forward head, increased lumbar lordosis, increased thoracic kyphosis, posterior pelvic tilt, and flexed trunk   PALPATION: NT  LOWER EXTREMITY MMT:  Active MMT Right eval Left eval  Hip flexion 4 4  Hip extension 3 3  Hip abduction 3+ 3+  Hip adduction    Hip internal rotation    Hip external rotation 4 4  Knee flexion 5 5  Knee extension 5 5  Ankle dorsiflexion 4 3  Ankle plantarflexion 4 3  Ankle inversion    Ankle  eversion     (Blank rows = not tested)  LOWER EXTREMITY ROM:  ROM Right eval Left eval  Hip flexion    Hip extension Decreased Decreased  Hip abduction Decreased Decreased  Hip adduction    Hip internal rotation Decreased Decreased  Hip external rotation Decreased Decreased  Knee flexion    Knee extension    Ankle dorsiflexion Decreased Decreased  Ankle plantarflexion    Ankle inversion    Ankle eversion     (Blank rows = not tested)  LOWER EXTREMITY SPECIAL TESTS:  Hip special tests: Luisa Hart (FABER) test: negative  FUNCTIONAL TESTS:  5 times sit to stand: 20.7" c use of hands 2 minute walk test: TBA: 10/13/23: 282 feet with SPC Single leg stand: L=1", R=5" Berg Balance: 48/56 10/13/23  GAIT: Distance walked: 150' Assistive device utilized: None Level of assistance: Complete Independence Comments: Decreased step length, decreased ankle DF,    TODAY'S TREATMENT:  [facility] Adult  PT Treatment:                                                DATE: 10/24/23 Therapeutic Exercise: Nustep L4 x 5 min Heel toe raises Side stepping yellow  Standing hip ext yellow, forearm supported on counter 5 x 2  Sink Squats x 10 Tandem stance x 4 without UE Blue rocker A/P and lateral rocking x 1 min each without UE LAQ 2 x 15 5# Supine marching x15 each Bridge 10 x 2 Small Rom LTR Supine clam green band  Supine alternating clam with green band    [facility] Adult PT Treatment:                                                DATE: 10/20/23 Therapeutic Exercise: Nustep L5 UE/LE x 5 min Marching on airex x2 30' Tandem stance on AIREX 10-15 sec without UE x 4  Supine marching x15 each Bridge x15 H/L hip clam 2x10 GTB Sink squat x 10  LAQ x15 4# Updated HEP Manual Therapy: Grade 3 L hip LAD      [facility] Adult PT Treatment:                                                DATE: 10/17/23 Therapeutic Exercise: Nustep L5 UE/Le x 5 min Side stepping at counter x 3 , added yellow band x 3   Standing hip ext yellow band x 10 each  Sink squat x 10  Standing March with 1 UE support  Tandem stance on AIREX 10-15 sec without UE x 4  A/P rocking on blue Rocker board without UE x 1 minute  STS from elevated seat x 8 - needs UE to rise Knee ext 10# x 10 bilat - pain in left hip so disc Supine with incline wedge: LTR ball bet knees x 10  AROM clam x 10 Partial Bridge x 10+ HEP  [facility] Adult PT Treatment:                                                DATE: 10/13/23 Therapeutic Activity:  2 MWT 282 feet STS from elevated seat x 6 Side stepping along counter 3 x down and back Tandem stance 45 sec- cues for posture SLS trials 5-10 sec with intermittent touch  BERG BALANCE TEST Sitting to Standing: 3.      Stands independently using hands Standing Unsupported: 4.      Stands safely for 2 minutes Sitting Unsupported: 4.     Sits for 2 minutes independently Standing to Sitting: 3.     Controls descent with hands  Transfers: 3.     Transfers safely definite use of hands Standing with eyes closed: 4.     Stands safely for 10 seconds  Standing with feet together: 4.     Stands for 1 minute safely Reaching forward with outstretched arm: 4.     Reaches forward 10 inches Retrieving object from the floor: 4.      Able to pick up easily and safely Turning to look behind: 4.     Looks behind from both sides and weight shifts well Turning 360 degrees: 2.     Able to turn slowly, but safely Place alternate foot on stool: 4.     Completes 8 steps in 20 seconds     Standing with one foot in front: 3.     Independent foot ahead for 30 seconds Standing on one foot: 2.     Holds >/=3 seconds Total Score: 48/56   [facility] Adult PT Treatment:                                                DATE: 09/27/23 Therapeutic Exercise: Developed, instructed in, and pt completed therex as  noted in HEP   PATIENT EDUCATION:  Education details: Eval findings, POC, HEP Person educated: Patient Education method: Explanation, Demonstration, Tactile cues, Verbal cues, and Handouts Education comprehension: verbalized understanding, returned demonstration, verbal cues required, and tactile cues required  HOME EXERCISE PROGRAM: Access Code: [email or web address] URL: [email or web address] Date: 10/20/2023 Prepared by: Joellyn Rued  Exercises - Standing Single Leg Stance with Counter Support  - 2-3 x daily - 7 x weekly - 1 sets - 5 reps - 20 hold - Standing Tandem Balance with Counter Support  - 1 x daily - 7 x weekly - 1 sets - 3 reps - 30 hold - Side Stepping with Counter Support  - 1 x daily - 7 x weekly - 1 sets - 3 reps - Mini Squat with Counter Support  - 1 x daily - 7 x weekly - 1-2 sets - 10 reps - 3 hold - Supine Bridge  - 1 x daily - 7 x weekly - 1-2 sets - 10 reps - 5 hold - Hooklying Isometric Clamshell  - 1 x daily - 7 x weekly - 2 sets - 10 reps - 3 hold  ASSESSMENT:  CLINICAL IMPRESSION: Pt reports the injection on his hip did not help. Overall he feels about the same as far as his LE strength and balance. He has increased his HEP compliance. STG#1 met. Pt tolerated PT today without adverse effects. Pt will continue to benefit from skilled PT to address impairments for improved functional mobility.   EVAL: Patient is a 82 y.o. male who was seen today for physical therapy evaluation and treatment for  I50.22 (ICD-10-CM) - Chronic systolic heart failure (HCC)  F62.130 (ICD-10-CM) - Weakness of both lower extremities  .Pt presents to PT with decreased LE strength, decreased balance, decreased activity tolerance, postural changes impacting COG and balance, L hip pain, and decreased tolerance to activity. Pt will  benefit from skilled PT to address impairments to optimize functional mobility.   OBJECTIVE IMPAIRMENTS: decreased activity tolerance, decreased balance,  decreased endurance, difficulty walking, decreased ROM, decreased strength, postural dysfunction, and pain.   ACTIVITY LIMITATIONS: carrying, lifting, bending, standing, squatting, sleeping, stairs, locomotion level, and caring for others  PARTICIPATION LIMITATIONS: meal prep, cleaning, laundry, driving, shopping, community activity, and yard work  PERSONAL FACTORS: Age, Fitness, Past/current experiences, Time since onset of injury/illness/exacerbation, and 1-2 comorbidities: cadiopulmonary issues, DM, Hx of back surgery  are also affecting patient's functional outcome.   REHAB POTENTIAL: Good  CLINICAL DECISION MAKING: Evolving/moderate complexity  EVALUATION COMPLEXITY: Moderate   GOALS:  SHORT TERM GOALS: Target date: 10/21/23 Pt will be Ind in an initial HEP  Baseline:started Goal status: MET  LONG TERM GOALS: Target date: 11/30/23  Pt will be Ind in a final HEP to maintain achieved LOF  Baseline:  Goal status: INITIAL  2.  Increase bilat hip strength and ankle strength by muscle grade for improved function and balance Baseline: see flow sheet Goal status: INITIAL  3.  Improve 5xSTS by MCID of 5" and by MCID of 24ft as indication of improved functional mobility  Baseline: 5xSTS 21.7 sec 10/13/23: 282 feet  Goal status: ONGOING  4.  Improve Berg balance by 5 pts as indication of improved balance Baseline: TBA 10/13/23: 48/56 Goal status: INITIAL  5.  Increase L LE single leg stand to 5" for improved balance Baseline: L 1", R 5" Goal status: INITIAL  6.  Pt's FOTO score will improved to the predicted value of 50% as indication of improved function  Baseline: 41% Goal status: INITIAL   PLAN:  PT FREQUENCY: 2x/week  PT DURATION: 8 weeks  PLANNED INTERVENTIONS: [id number]- PT Re-evaluation, 97110-Therapeutic exercises, 97530- Therapeutic activity, 97112- Neuromuscular re-education, 97535- Self Care, 88416- Manual therapy, [phone]- Gait training, Patient/Family  education, Balance training, Stair training, Joint manipulation, Cryotherapy, and Moist heat  PLAN FOR NEXT SESSION:  Review FOTO; assess response to HEP; progress therex as indicated; use of modalities, manual therapy; and TPDN as indicated.  Jannette Spanner, PTA 10/24/23 11:54 AM Phone: [phone] Fax: [phone]

## 2023-10-27 ENCOUNTER — Ambulatory Visit: Payer: Medicare HMO | Admitting: Physical Therapy

## 2023-10-27 ENCOUNTER — Ambulatory Visit (INDEPENDENT_AMBULATORY_CARE_PROVIDER_SITE_OTHER): Payer: Medicare HMO

## 2023-10-27 DIAGNOSIS — R262 Difficulty in walking, not elsewhere classified: Secondary | ICD-10-CM | POA: Diagnosis not present

## 2023-10-27 DIAGNOSIS — R293 Abnormal posture: Secondary | ICD-10-CM | POA: Diagnosis not present

## 2023-10-27 DIAGNOSIS — I255 Ischemic cardiomyopathy: Secondary | ICD-10-CM

## 2023-10-27 DIAGNOSIS — M6281 Muscle weakness (generalized): Secondary | ICD-10-CM | POA: Diagnosis not present

## 2023-10-27 LAB — CUP PACEART REMOTE DEVICE CHECK
Battery Remaining Longevity: 2 mo
Battery Voltage: 2.75 V
Brady Statistic AP VP Percent: 0 %
Brady Statistic AP VS Percent: 0 %
Brady Statistic AS VP Percent: 0 %
Brady Statistic AS VS Percent: 0 %
Brady Statistic RA Percent Paced: 0 %
Brady Statistic RV Percent Paced: 94.46 %
Date Time Interrogation Session: 20241121043721
HighPow Impedance: 86 Ohm
Implantable Lead Connection Status: 753985
Implantable Lead Connection Status: 753985
Implantable Lead Implant Date: 20010723
Implantable Lead Implant Date: 20180423
Implantable Lead Location: 753858
Implantable Lead Location: 753860
Implantable Lead Model: 6943
Implantable Pulse Generator Implant Date: 20180423
Lead Channel Impedance Value: 246.635
Lead Channel Impedance Value: 246.635
Lead Channel Impedance Value: 255.093
Lead Channel Impedance Value: 256.5 Ohm
Lead Channel Impedance Value: 265.661
Lead Channel Impedance Value: 304 Ohm
Lead Channel Impedance Value: 456 Ohm
Lead Channel Impedance Value: 475 Ohm
Lead Channel Impedance Value: 513 Ohm
Lead Channel Impedance Value: 513 Ohm
Lead Channel Impedance Value: 551 Ohm
Lead Channel Impedance Value: 836 Ohm
Lead Channel Impedance Value: 874 Ohm
Lead Channel Impedance Value: 874 Ohm
Lead Channel Impedance Value: 893 Ohm
Lead Channel Impedance Value: 931 Ohm
Lead Channel Impedance Value: 988 Ohm
Lead Channel Pacing Threshold Amplitude: 1.125 V
Lead Channel Pacing Threshold Amplitude: 1.25 V
Lead Channel Pacing Threshold Pulse Width: 0.4 ms
Lead Channel Pacing Threshold Pulse Width: 1 ms
Lead Channel Sensing Intrinsic Amplitude: 15.375 mV
Lead Channel Sensing Intrinsic Amplitude: 15.375 mV
Lead Channel Setting Pacing Amplitude: 1.75 V
Lead Channel Setting Pacing Amplitude: 2.5 V
Lead Channel Setting Pacing Pulse Width: 0.4 ms
Lead Channel Setting Pacing Pulse Width: 1 ms
Lead Channel Setting Sensing Sensitivity: 0.3 mV
Zone Setting Status: 755011

## 2023-10-27 NOTE — Therapy (Signed)
OUTPATIENT PHYSICAL THERAPY LOWER EXTREMITY TREATMENT   Patient Name: Carl Wood MRN: [id number] DOB:1941-09-17, 82 y.o., adult Today's Date: 10/27/2023  END OF SESSION:  PT End of Session - 10/27/23 1101     Visit Number 6    Number of Visits 17    Date for PT Re-Evaluation 11/30/23    Authorization Type [employer]    Authorization Time Period Approved 10 PT visits from 10/10/23-11/30/23    Authorization - Visit Number 5    Authorization - Number of Visits 10    PT Start Time 1100    PT Stop Time 1145    PT Time Calculation (min) 45 min              Past Medical History:  Diagnosis Date   CAD (coronary artery disease)    CHF (congestive heart failure) (HCC)    Chronic atrial fibrillation (HCC)    Colon polyps    Diabetes mellitus    Diverticulosis of colon    DIVERTICULOSIS, COLON 10/16/2006   Qualifier: Diagnosis of  By: Cato Mulligan MD, Bruce     Excessive daytime sleepiness 02/19/2016   Hyperlipidemia    Hypertension    MI (mitral incompetence)    Nephrolithiasis    NEPHROLITHIASIS 06/26/2008   Qualifier: Diagnosis of  By: Linna Darner, CMA, Cindy     OSA (obstructive sleep apnea) 02/19/2016   Moderate to severe OSA with an AHI of 25/hr and on CPAP at 8cm H2O   PE (pulmonary embolism)    V-tach [facility])    Past Surgical History:  Procedure Laterality Date   BACK SURGERY     BASAL CELL CARCINOMA EXCISION     nose   BIV UPGRADE N/A 03/28/2017   Procedure: BiV Upgrade;  Surgeon: Duke Salvia, MD;  Location: [facility] INVASIVE CV LAB;  Service: Cardiovascular;  Laterality: N/A;   CARDIAC CATHETERIZATION N/A 01/20/2016   Procedure: Right/Left Heart Cath and Coronary Angiography;  Surgeon: Dolores Patty, MD;  Location: [facility] INVASIVE CV LAB;  Service: Cardiovascular;  Laterality: N/A;   CARDIAC DEFIBRILLATOR PLACEMENT     medtronic virtuoso   CHOLECYSTECTOMY     COLONOSCOPY  12/11/2012   Procedure: COLONOSCOPY;  Surgeon: Louis Meckel, MD;  Location: [facility]  ENDOSCOPY;  Service: Endoscopy;  Laterality: N/A;   KNEE ARTHROPLASTY     Patient Active Problem List   Diagnosis Date Noted   Permanent atrial fibrillation (HCC) 05/10/2023   IVCD (intraventricular conduction defect) 05/10/2023   COPD (chronic obstructive pulmonary disease) (HCC) 12/26/2018   Osteoarthritis of right knee 09/22/2018   History of subdural hematoma 11/21/2017   Frontal headache 10/06/2017   Rhinitis 08/22/2017   Dyspnea on exertion 08/22/2017   Ischemic cardiomyopathy 03/28/2017   Chronic atrial fibrillation (HCC)    OSA (obstructive sleep apnea) 02/19/2016   Chronic systolic heart failure (HCC) 08/06/2015   LBBB (left bundle branch block) 08/06/2015   Carotid artery stenosis 03/17/2015   Former smoker 11/15/2014   Actinic keratosis 11/15/2014   History of cardioembolic cerebrovascular accident (CVA) 12/19/2012   Benign neoplasm of colon 12/11/2012   Implantable cardioverter-defibrillator (ICD) in situ 02/17/2012    ventricular tachycardia-non sustained  02/17/2012   History of skin cancer 07/05/2007   Diabetes mellitus type II, controlled (HCC) 10/16/2006   Hyperlipidemia associated with type 2 diabetes mellitus (HCC) 10/16/2006   Essential hypertension 10/16/2006   CAD (coronary artery disease) 10/16/2006   Chronic pulmonary embolism (HCC) 10/16/2006    PCP: Shelva Majestic,  MD   REFERRING PROVIDER: Dolores Patty, MD   REFERRING DIAG:  I50.22 (ICD-10-CM) - Chronic systolic heart failure (HCC)  Z61.096 (ICD-10-CM) - Weakness of both lower extremities    THERAPY DIAG:  Difficulty in walking, not elsewhere classified  Muscle weakness (generalized)  Abnormal posture  Rationale for Evaluation and Treatment: Rehabilitation  ONSET DATE: chronic  SUBJECTIVE:   SUBJECTIVE STATEMENT: Pt reports injection did not help his hip. Knee is better today.  He reports compliance with HEP 1 x per day , at the least.   EVAL: Pt reports over several  years his legs have become weaker, his balance has decreased, and his walking tolerance decreased. Pt denies falling. Pt will use a rollator in the community when he knows he will need stand or walk for prolong time frames. Pt reports a Hx of L hip pain which can interfere with his mobility at times.  PERTINENT HISTORY: CAD, chronic Afib, DM, Hx back surgery, Hx CVA, defibrillator  PAIN:  Are you having pain? Yes: NPRS scale: 5/10 sharp intermittent pains Pain location: L hip Pain description: sharp, ache Aggravating factors: Sit to stand, prolonged sitting, standing, and walking Relieving factors: Rest, sleeps in a recliner  PRECAUTIONS: Other: Cardiopulmonary , defibrillator  RED FLAGS: None   WEIGHT BEARING RESTRICTIONS: No  FALLS:  Has patient fallen in last 6 months? No  LIVING ENVIRONMENT: Lives with: lives with their family Lives in: House/apartment No issue with accessing home or mobility within  OCCUPATION: Retired  PLOF: Independent  PATIENT GOALS: Improved leg strength, balance, and activity tolerance   OBJECTIVE:  Note: Objective measures were completed at Evaluation unless otherwise noted.  DIAGNOSTIC FINDINGS: NA  PATIENT SURVEYS:  FOTO: Perceived function   41%, predicted   50%   COGNITION: Overall cognitive status: Within functional limits for tasks assessed     SENSATION: WFL  EDEMA:  NT  MUSCLE LENGTH: Hamstrings: Right NT deg; Left NT deg Thomas test: Right NT deg; Left NT deg  POSTURE: rounded shoulders, forward head, increased lumbar lordosis, increased thoracic kyphosis, posterior pelvic tilt, and flexed trunk   PALPATION: NT  LOWER EXTREMITY MMT:  Active MMT Right eval Left eval  Hip flexion 4 4  Hip extension 3 3  Hip abduction 3+ 3+  Hip adduction    Hip internal rotation    Hip external rotation 4 4  Knee flexion 5 5  Knee extension 5 5  Ankle dorsiflexion 4 3  Ankle plantarflexion 4 3  Ankle inversion    Ankle  eversion     (Blank rows = not tested)  LOWER EXTREMITY ROM:  ROM Right eval Left eval  Hip flexion    Hip extension Decreased Decreased  Hip abduction Decreased Decreased  Hip adduction    Hip internal rotation Decreased Decreased  Hip external rotation Decreased Decreased  Knee flexion    Knee extension    Ankle dorsiflexion Decreased Decreased  Ankle plantarflexion    Ankle inversion    Ankle eversion     (Blank rows = not tested)  LOWER EXTREMITY SPECIAL TESTS:  Hip special tests: Luisa Hart (FABER) test: negative  FUNCTIONAL TESTS:  5 times sit to stand: 20.7" c use of hands 2 minute walk test: TBA: 10/13/23: 282 feet with SPC Single leg stand: L=1", R=5" Berg Balance: 48/56 10/13/23  GAIT: Distance walked: 150' Assistive device utilized: None Level of assistance: Complete Independence Comments: Decreased step length, decreased ankle DF,    TODAY'S TREATMENT:  [facility] Adult  PT Treatment:                                                DATE: 10/27/23 Therapeutic Exercise: Nustep L5 UE/LE x 5 minutes  Heel raises x 20 Hip abduction 10 x 2 yellow band Hip extension 10 x 2 yellow band  Half squats in // bars with UE x 10 Seated LAQ 5# 15 x 2 each  Therapeutic Activity: Tandem AIREX Pad: Tandem stance, tandem gait forward and retro with 1UE, side stepping without UE Alternating and unilateral step taps to 6 inch step  Manual perturbations in standing 4 directions pt able to maintain balance without assist.     [facility] Adult PT Treatment:                                                DATE: 10/24/23 Therapeutic Exercise: Nustep L4 x 5 min Heel toe raises Side stepping yellow  Standing hip ext yellow, forearm supported on counter 5 x 2  Sink Squats x 10 Tandem stance AIREX x 4 without UE Blue rocker A/P and lateral rocking x 1 min each without UE LAQ 2 x 15 5# Supine marching x15 each Bridge 10 x 2 Small Rom LTR Supine clam green band  Supine alternating clam  with green band    [facility] Adult PT Treatment:                                                DATE: 10/20/23 Therapeutic Exercise: Nustep L5 UE/LE x 5 min Marching on airex x2 30' Tandem stance on AIREX 10-15 sec without UE x 4  Supine marching x15 each Bridge x15 H/L hip clam 2x10 GTB Sink squat x 10  LAQ x15 4# Updated HEP Manual Therapy: Grade 3 L hip LAD      [facility] Adult PT Treatment:                                                DATE: 10/17/23 Therapeutic Exercise: Nustep L5 UE/Le x 5 min Side stepping at counter x 3 , added yellow band x 3  Standing hip ext yellow band x 10 each  Sink squat x 10  Standing March with 1 UE support  Tandem stance on AIREX 10-15 sec without UE x 4  A/P rocking on blue Rocker board without UE x 1 minute  STS from elevated seat x 8 - needs UE to rise Knee ext 10# x 10 bilat - pain in left hip so disc Supine with incline wedge: LTR ball bet knees x 10  AROM clam x 10 Partial Bridge x 10+ HEP  [facility] Adult PT Treatment:                                                DATE: 10/13/23  Therapeutic Activity:  2 MWT 282 feet STS from elevated seat x 6 Side stepping along counter 3 x down and back Tandem stance 45 sec- cues for posture SLS trials 5-10 sec with intermittent touch                                                                                                                           BERG BALANCE TEST Sitting to Standing: 3.      Stands independently using hands Standing Unsupported: 4.      Stands safely for 2 minutes Sitting Unsupported: 4.     Sits for 2 minutes independently Standing to Sitting: 3.     Controls descent with hands  Transfers: 3.     Transfers safely definite use of hands Standing with eyes closed: 4.     Stands safely for 10 seconds  Standing with feet together: 4.     Stands for 1 minute safely Reaching forward with outstretched arm: 4.     Reaches forward 10 inches Retrieving object from the floor: 4.      Able  to pick up easily and safely Turning to look behind: 4.     Looks behind from both sides and weight shifts well Turning 360 degrees: 2.     Able to turn slowly, but safely Place alternate foot on stool: 4.     Completes 8 steps in 20 seconds     Standing with one foot in front: 3.     Independent foot ahead for 30 seconds Standing on one foot: 2.     Holds >/=3 seconds Total Score: 48/56   [facility] Adult PT Treatment:                                                DATE: 09/27/23 Therapeutic Exercise: Developed, instructed in, and pt completed therex as noted in HEP   PATIENT EDUCATION:  Education details: Eval findings, POC, HEP Person educated: Patient Education method: Explanation, Demonstration, Tactile cues, Verbal cues, and Handouts Education comprehension: verbalized understanding, returned demonstration, verbal cues required, and tactile cues required  HOME EXERCISE PROGRAM: Access Code: [email or web address] URL: [email or web address] Date: 10/20/2023 Prepared by: Joellyn Rued  Exercises - Standing Single Leg Stance with Counter Support  - 2-3 x daily - 7 x weekly - 1 sets - 5 reps - 20 hold - Standing Tandem Balance with Counter Support  - 1 x daily - 7 x weekly - 1 sets - 3 reps - 30 hold - Side Stepping with Counter Support  - 1 x daily - 7 x weekly - 1 sets - 3 reps - Mini Squat with Counter Support  - 1 x daily - 7 x weekly - 1-2 sets - 10 reps - 3 hold - Supine Bridge  - 1 x daily - 7 x weekly -  1-2 sets - 10 reps - 5 hold - Hooklying Isometric Clamshell  - 1 x daily - 7 x weekly - 2 sets - 10 reps - 3 hold  ASSESSMENT:  CLINICAL IMPRESSION: Pt reports hip pain with certain movements. Still not improvement since hip injection. Increased dynamic balance challenges today which he tolerated well. Also, pt remained in closed chain for entire session with seated rest breaks as needed. Pt tolerated PT today without adverse effects. Pt will continue to benefit from skilled PT  to address impairments for improved functional mobility.   EVAL: Patient is a 82 y.o. male who was seen today for physical therapy evaluation and treatment for  I50.22 (ICD-10-CM) - Chronic systolic heart failure (HCC)  N62.952 (ICD-10-CM) - Weakness of both lower extremities  .Pt presents to PT with decreased LE strength, decreased balance, decreased activity tolerance, postural changes impacting COG and balance, L hip pain, and decreased tolerance to activity. Pt will benefit from skilled PT to address impairments to optimize functional mobility.   OBJECTIVE IMPAIRMENTS: decreased activity tolerance, decreased balance, decreased endurance, difficulty walking, decreased ROM, decreased strength, postural dysfunction, and pain.   ACTIVITY LIMITATIONS: carrying, lifting, bending, standing, squatting, sleeping, stairs, locomotion level, and caring for others  PARTICIPATION LIMITATIONS: meal prep, cleaning, laundry, driving, shopping, community activity, and yard work  PERSONAL FACTORS: Age, Fitness, Past/current experiences, Time since onset of injury/illness/exacerbation, and 1-2 comorbidities: cadiopulmonary issues, DM, Hx of back surgery  are also affecting patient's functional outcome.   REHAB POTENTIAL: Good  CLINICAL DECISION MAKING: Evolving/moderate complexity  EVALUATION COMPLEXITY: Moderate   GOALS:  SHORT TERM GOALS: Target date: 10/21/23 Pt will be Ind in an initial HEP  Baseline:started Goal status: MET  LONG TERM GOALS: Target date: 11/30/23  Pt will be Ind in a final HEP to maintain achieved LOF  Baseline:  Goal status: INITIAL  2.  Increase bilat hip strength and ankle strength by muscle grade for improved function and balance Baseline: see flow sheet Goal status: INITIAL  3.  Improve 5xSTS by MCID of 5" and by MCID of 28ft as indication of improved functional mobility  Baseline: 5xSTS 21.7 sec 10/13/23: 282 feet  Goal status: ONGOING  4.  Improve Berg  balance by 5 pts as indication of improved balance Baseline: TBA 10/13/23: 48/56 Goal status: INITIAL  5.  Increase L LE single leg stand to 5" for improved balance Baseline: L 1", R 5" Goal status: INITIAL  6.  Pt's FOTO score will improved to the predicted value of 50% as indication of improved function  Baseline: 41% Goal status: INITIAL   PLAN:  PT FREQUENCY: 2x/week  PT DURATION: 8 weeks  PLANNED INTERVENTIONS: 97164- PT Re-evaluation, 97110-Therapeutic exercises, 97530- Therapeutic activity, 97112- Neuromuscular re-education, 97535- Self Care, 84132- Manual therapy, [id number]- Gait training, Patient/Family education, Balance training, Stair training, Joint manipulation, Cryotherapy, and Moist heat  PLAN FOR NEXT SESSION:  Review FOTO; assess response to HEP; progress therex as indicated; use of modalities, manual therapy; and TPDN as indicated.  Jannette Spanner, PTA 10/27/23 11:57 AM Phone: [phone] Fax: [phone]

## 2023-10-28 ENCOUNTER — Telehealth: Payer: Self-pay

## 2023-10-28 NOTE — Telephone Encounter (Signed)
  Scheduled remote reviewed. Normal device function.   The device estimates 2 months until ERI There were no treated VT arrhythmias but there were 3 NSVT arrhythmias and one VT arrhythmia that was in the monitor only zone and was 28 seconds (#43) sent to triage Next remote 11/28/2023. ML, CVRS   Reviewed w/ GT. No changes at this time d/t patient being asymptomatic. CJL

## 2023-10-31 ENCOUNTER — Encounter: Payer: Self-pay | Admitting: Physical Therapy

## 2023-10-31 ENCOUNTER — Ambulatory Visit: Payer: Medicare HMO | Admitting: Physical Therapy

## 2023-10-31 DIAGNOSIS — R262 Difficulty in walking, not elsewhere classified: Secondary | ICD-10-CM

## 2023-10-31 DIAGNOSIS — M6281 Muscle weakness (generalized): Secondary | ICD-10-CM | POA: Diagnosis not present

## 2023-10-31 DIAGNOSIS — R293 Abnormal posture: Secondary | ICD-10-CM

## 2023-10-31 NOTE — Therapy (Signed)
OUTPATIENT PHYSICAL THERAPY LOWER EXTREMITY TREATMENT   Patient Name: Carl Wood MRN: [id number] DOB:[birth year], 82 y.o., adult Today's Date: 10/31/2023  END OF SESSION:  PT End of Session - 10/31/23 1038     Visit Number 7    Number of Visits 17    Date for PT Re-Evaluation 11/30/23    Authorization Type [employer]    Authorization Time Period Approved 10 PT visits from 10/10/23-11/30/23    Authorization - Visit Number 6    Authorization - Number of Visits 10    PT Start Time 1046    PT Stop Time 1135    PT Time Calculation (min) 49 min              Past Medical History:  Diagnosis Date   CAD (coronary artery disease)    CHF (congestive heart failure) (HCC)    Chronic atrial fibrillation (HCC)    Colon polyps    Diabetes mellitus    Diverticulosis of colon    DIVERTICULOSIS, COLON 10/16/2006   Qualifier: Diagnosis of  By: Cato Mulligan MD, Bruce     Excessive daytime sleepiness 02/19/2016   Hyperlipidemia    Hypertension    MI (mitral incompetence)    Nephrolithiasis    NEPHROLITHIASIS 06/26/2008   Qualifier: Diagnosis of  By: Linna Darner, CMA, Cindy     OSA (obstructive sleep apnea) 02/19/2016   Moderate to severe OSA with an AHI of 25/hr and on CPAP at 8cm H2O   PE (pulmonary embolism)    V-tach [facility])    Past Surgical History:  Procedure Laterality Date   BACK SURGERY     BASAL CELL CARCINOMA EXCISION     nose   BIV UPGRADE N/A 03/28/2017   Procedure: BiV Upgrade;  Surgeon: Duke Salvia, MD;  Location: [facility] INVASIVE CV LAB;  Service: Cardiovascular;  Laterality: N/A;   CARDIAC CATHETERIZATION N/A 01/20/2016   Procedure: Right/Left Heart Cath and Coronary Angiography;  Surgeon: Dolores Patty, MD;  Location: [facility] INVASIVE CV LAB;  Service: Cardiovascular;  Laterality: N/A;   CARDIAC DEFIBRILLATOR PLACEMENT     medtronic virtuoso   CHOLECYSTECTOMY     COLONOSCOPY  12/11/2012   Procedure: COLONOSCOPY;  Surgeon: Louis Meckel, MD;  Location: [facility]  ENDOSCOPY;  Service: Endoscopy;  Laterality: N/A;   KNEE ARTHROPLASTY     Patient Active Problem List   Diagnosis Date Noted   Permanent atrial fibrillation (HCC) 05/10/2023   IVCD (intraventricular conduction defect) 05/10/2023   COPD (chronic obstructive pulmonary disease) (HCC) 12/26/2018   Osteoarthritis of right knee 09/22/2018   History of subdural hematoma 11/21/2017   Frontal headache 10/06/2017   Rhinitis 08/22/2017   Dyspnea on exertion 08/22/2017   Ischemic cardiomyopathy 03/28/2017   Chronic atrial fibrillation (HCC)    OSA (obstructive sleep apnea) 02/19/2016   Chronic systolic heart failure (HCC) 08/06/2015   LBBB (left bundle branch block) 08/06/2015   Carotid artery stenosis 03/17/2015   Former smoker 11/15/2014   Actinic keratosis 11/15/2014   History of cardioembolic cerebrovascular accident (CVA) 12/19/2012   Benign neoplasm of colon 12/11/2012   Implantable cardioverter-defibrillator (ICD) in situ 02/17/2012    ventricular tachycardia-non sustained  02/17/2012   History of skin cancer 07/05/2007   Diabetes mellitus type II, controlled (HCC) 10/16/2006   Hyperlipidemia associated with type 2 diabetes mellitus (HCC) 10/16/2006   Essential hypertension 10/16/2006   CAD (coronary artery disease) 10/16/2006   Chronic pulmonary embolism (HCC) 10/16/2006    PCP: Shelva Majestic,  MD   REFERRING PROVIDER: Dolores Patty, MD   REFERRING DIAG:  I50.22 (ICD-10-CM) - Chronic systolic heart failure (HCC)  W09.811 (ICD-10-CM) - Weakness of both lower extremities    THERAPY DIAG:  Difficulty in walking, not elsewhere classified  Muscle weakness (generalized)  Abnormal posture  Rationale for Evaluation and Treatment: Rehabilitation  ONSET DATE: chronic  SUBJECTIVE:   SUBJECTIVE STATEMENT: Pt reports injection did not help his hip. Knee is better today.  He reports compliance with HEP 1 x per day , at the least.   EVAL: Pt reports over several  years his legs have become weaker, his balance has decreased, and his walking tolerance decreased. Pt denies falling. Pt will use a rollator in the community when he knows he will need stand or walk for prolong time frames. Pt reports a Hx of L hip pain which can interfere with his mobility at times.  PERTINENT HISTORY: CAD, chronic Afib, DM, Hx back surgery, Hx CVA, defibrillator  PAIN:  Are you having pain? Yes: NPRS scale: 5/10 sharp intermittent pains Pain location: L hip Pain description: sharp, ache Aggravating factors: Sit to stand, prolonged sitting, standing, and walking Relieving factors: Rest, sleeps in a recliner  PRECAUTIONS: Other: Cardiopulmonary , defibrillator  RED FLAGS: None   WEIGHT BEARING RESTRICTIONS: No  FALLS:  Has patient fallen in last 6 months? No  LIVING ENVIRONMENT: Lives with: lives with their family Lives in: House/apartment No issue with accessing home or mobility within  OCCUPATION: Retired  PLOF: Independent  PATIENT GOALS: Improved leg strength, balance, and activity tolerance   OBJECTIVE:  Note: Objective measures were completed at Evaluation unless otherwise noted.  DIAGNOSTIC FINDINGS: NA  PATIENT SURVEYS:  FOTO: Perceived function   41%, predicted   50%  FOTO 47% 10/31/23  COGNITION: Overall cognitive status: Within functional limits for tasks assessed     SENSATION: WFL  EDEMA:  NT  MUSCLE LENGTH: Hamstrings: Right NT deg; Left NT deg Maisie Fus test: Right NT deg; Left NT deg  POSTURE: rounded shoulders, forward head, increased lumbar lordosis, increased thoracic kyphosis, posterior pelvic tilt, and flexed trunk   PALPATION: NT  LOWER EXTREMITY MMT:  Active MMT Right eval Left eval  Hip flexion 4 4  Hip extension 3 3  Hip abduction 3+ 3+  Hip adduction    Hip internal rotation    Hip external rotation 4 4  Knee flexion 5 5  Knee extension 5 5  Ankle dorsiflexion 4 3  Ankle plantarflexion 4 3  Ankle  inversion    Ankle eversion     (Blank rows = not tested)  LOWER EXTREMITY ROM:  ROM Right eval Left eval  Hip flexion    Hip extension Decreased Decreased  Hip abduction Decreased Decreased  Hip adduction    Hip internal rotation Decreased Decreased  Hip external rotation Decreased Decreased  Knee flexion    Knee extension    Ankle dorsiflexion Decreased Decreased  Ankle plantarflexion    Ankle inversion    Ankle eversion     (Blank rows = not tested)  LOWER EXTREMITY SPECIAL TESTS:  Hip special tests: Luisa Hart (FABER) test: negative  FUNCTIONAL TESTS:  5 times sit to stand: 20.7" c use of hands 2 minute walk test: TBA: 10/13/23: 282 feet with SPC Single leg stand: L=1", R=5": 10/31/23: 5 sec left, 10 sec right Berg Balance: 48/56 10/13/23  GAIT: Distance walked: 150' Assistive device utilized: None Level of assistance: Complete Independence Comments: Decreased step length, decreased  ankle DF,    TODAY'S TREATMENT:  [facility] Adult PT Treatment:                                                DATE: 10/31/23 Therapeutic Exercise: Nustep L5 x 6 min  Heel and toe raises Sink squats x 10 Hip abduction 10 x 2 yellow band Hip extension 10 x 2 yellow band 6 inch step up with counter support x 10 each  Bridge 10 x 2  Hooklying clam GTB 10 x 2  Hooklying March GTB 10 x 2   Therapeutic Activity: SLS 10 sec right, 5 sec left Tandem stance 60 sec, 35 sec Tandem gait along counter  STS x 10 bariatric chair with AIREX to elevate, without UE- cues to control descent.     [facility] Adult PT Treatment:                                                DATE: 10/27/23 Therapeutic Exercise: Nustep L5 UE/LE x 5 minutes  Heel raises x 20 Hip abduction 10 x 2 yellow band Hip extension 10 x 2 yellow band  Half squats in // bars with UE x 10 Seated LAQ 5# 15 x 2 each  Therapeutic Activity: Tandem AIREX Pad: Tandem stance, tandem gait forward and retro with 1UE, side stepping without  UE Alternating and unilateral step taps to 6 inch step  Manual perturbations in standing 4 directions pt able to maintain balance without assist.     [facility] Adult PT Treatment:                                                DATE: 10/24/23 Therapeutic Exercise: Nustep L4 x 5 min Heel toe raises Side stepping yellow  Standing hip ext yellow, forearm supported on counter 5 x 2  Sink Squats x 10 Tandem stance AIREX x 4 without UE Blue rocker A/P and lateral rocking x 1 min each without UE LAQ 2 x 15 5# Supine marching x15 each Bridge 10 x 2 Small Rom LTR Supine clam green band  Supine alternating clam with green band    [facility] Adult PT Treatment:                                                DATE: 10/20/23 Therapeutic Exercise: Nustep L5 UE/LE x 5 min Marching on airex x2 30' Tandem stance on AIREX 10-15 sec without UE x 4  Supine marching x15 each Bridge x15 H/L hip clam 2x10 GTB Sink squat x 10  LAQ x15 4# Updated HEP Manual Therapy: Grade 3 L hip LAD      [facility] Adult PT Treatment:                                                DATE: 10/17/23  Therapeutic Exercise: Nustep L5 UE/Le x 5 min Side stepping at counter x 3 , added yellow band x 3  Standing hip ext yellow band x 10 each  Sink squat x 10  Standing March with 1 UE support  Tandem stance on AIREX 10-15 sec without UE x 4  A/P rocking on blue Rocker board without UE x 1 minute  STS from elevated seat x 8 - needs UE to rise Knee ext 10# x 10 bilat - pain in left hip so disc Supine with incline wedge: LTR ball bet knees x 10  AROM clam x 10 Partial Bridge x 10+ HEP  [facility] Adult PT Treatment:                                                DATE: 10/13/23 Therapeutic Activity:  2 MWT 282 feet STS from elevated seat x 6 Side stepping along counter 3 x down and back Tandem stance 45 sec- cues for posture SLS trials 5-10 sec with intermittent touch                                                                                                                            BERG BALANCE TEST Sitting to Standing: 3.      Stands independently using hands Standing Unsupported: 4.      Stands safely for 2 minutes Sitting Unsupported: 4.     Sits for 2 minutes independently Standing to Sitting: 3.     Controls descent with hands  Transfers: 3.     Transfers safely definite use of hands Standing with eyes closed: 4.     Stands safely for 10 seconds  Standing with feet together: 4.     Stands for 1 minute safely Reaching forward with outstretched arm: 4.     Reaches forward 10 inches Retrieving object from the floor: 4.      Able to pick up easily and safely Turning to look behind: 4.     Looks behind from both sides and weight shifts well Turning 360 degrees: 2.     Able to turn slowly, but safely Place alternate foot on stool: 4.     Completes 8 steps in 20 seconds     Standing with one foot in front: 3.     Independent foot ahead for 30 seconds Standing on one foot: 2.     Holds >/=3 seconds Total Score: 48/56   [facility] Adult PT Treatment:                                                DATE: 09/27/23 Therapeutic Exercise: Developed, instructed in, and pt completed therex as noted in  HEP   PATIENT EDUCATION:  Education details: Eval findings, POC, HEP Person educated: Patient Education method: Explanation, Demonstration, Tactile cues, Verbal cues, and Handouts Education comprehension: verbalized understanding, returned demonstration, verbal cues required, and tactile cues required  HOME EXERCISE PROGRAM: Access Code: [email or web address] URL: [email or web address] Date: 10/20/2023 Prepared by: Joellyn Rued  Exercises - Standing Single Leg Stance with Counter Support  - 2-3 x daily - 7 x weekly - 1 sets - 5 reps - 20 hold - Standing Tandem Balance with Counter Support  - 1 x daily - 7 x weekly - 1 sets - 3 reps - 30 hold - Side Stepping with Counter Support  - 1 x daily - 7 x weekly - 1 sets - 3 reps -  Mini Squat with Counter Support  - 1 x daily - 7 x weekly - 1-2 sets - 10 reps - 3 hold - Supine Bridge  - 1 x daily - 7 x weekly - 1-2 sets - 10 reps - 5 hold - Hooklying Isometric Clamshell  - 1 x daily - 7 x weekly - 2 sets - 10 reps - 3 hold  ASSESSMENT:  CLINICAL IMPRESSION: Pt reports hip pain with certain movements continues. His SLS and FOTO score have improved today. LTG# 5 met. He is able to rise from chair without UE however cannot control entire descent to chair.  Pt tolerated PT today without adverse effects. Pt will continue to benefit from skilled PT to address impairments for improved functional mobility.   EVAL: Patient is a 82 y.o. male who was seen today for physical therapy evaluation and treatment for  I50.22 (ICD-10-CM) - Chronic systolic heart failure (HCC)  H47.425 (ICD-10-CM) - Weakness of both lower extremities  .Pt presents to PT with decreased LE strength, decreased balance, decreased activity tolerance, postural changes impacting COG and balance, L hip pain, and decreased tolerance to activity. Pt will benefit from skilled PT to address impairments to optimize functional mobility.   OBJECTIVE IMPAIRMENTS: decreased activity tolerance, decreased balance, decreased endurance, difficulty walking, decreased ROM, decreased strength, postural dysfunction, and pain.   ACTIVITY LIMITATIONS: carrying, lifting, bending, standing, squatting, sleeping, stairs, locomotion level, and caring for others  PARTICIPATION LIMITATIONS: meal prep, cleaning, laundry, driving, shopping, community activity, and yard work  PERSONAL FACTORS: Age, Fitness, Past/current experiences, Time since onset of injury/illness/exacerbation, and 1-2 comorbidities: cadiopulmonary issues, DM, Hx of back surgery  are also affecting patient's functional outcome.   REHAB POTENTIAL: Good  CLINICAL DECISION MAKING: Evolving/moderate complexity  EVALUATION COMPLEXITY: Moderate   GOALS:  SHORT TERM  GOALS: Target date: 10/21/23 Pt will be Ind in an initial HEP  Baseline:started Goal status: MET  LONG TERM GOALS: Target date: 11/30/23  Pt will be Ind in a final HEP to maintain achieved LOF  Baseline:  Goal status: INITIAL  2.  Increase bilat hip strength and ankle strength by muscle grade for improved function and balance Baseline: see flow sheet Goal status: INITIAL  3.  Improve 5xSTS by MCID of 5" and by MCID of 72ft as indication of improved functional mobility  Baseline: 5xSTS 21.7 sec 10/13/23: 282 feet  Goal status: ONGOING  4.  Improve Berg balance by 5 pts as indication of improved balance Baseline: TBA 10/13/23: 48/56 Goal status: INITIAL  5.  Increase L LE single leg stand to 5" for improved balance Baseline: L 1", R 5" 10/31/23: 5 sec  Goal status: MET   6.  Pt's FOTO score will improved to the predicted  value of 50% as indication of improved function  Baseline: 41% 10/31/23: 47% Goal status: ONGOING   PLAN:  PT FREQUENCY: 2x/week  PT DURATION: 8 weeks  PLANNED INTERVENTIONS: 97164- PT Re-evaluation, 97110-Therapeutic exercises, 97530- Therapeutic activity, 97112- Neuromuscular re-education, 97535- Self Care, 62130- Manual therapy, [phone]- Gait training, Patient/Family education, Balance training, Stair training, Joint manipulation, Cryotherapy, and Moist heat  PLAN FOR NEXT SESSION:  ; assess response to HEP; progress therex as indicated; use of modalities, manual therapy; and TPDN as indicated.  Jannette Spanner, PTA 10/31/23 1:01 PM Phone: [phone] Fax: [phone]

## 2023-10-31 NOTE — Therapy (Deleted)
OUTPATIENT PHYSICAL THERAPY LOWER EXTREMITY TREATMENT   Patient Name: Carl Wood MRN: [id number] DOB:02/12/1941, 82 y.o., adult Today's Date: 10/31/2023  END OF SESSION:     Past Medical History:  Diagnosis Date   CAD (coronary artery disease)    CHF (congestive heart failure) (HCC)    Chronic atrial fibrillation (HCC)    Colon polyps    Diabetes mellitus    Diverticulosis of colon    DIVERTICULOSIS, COLON 10/16/2006   Qualifier: Diagnosis of  By: Cato Mulligan MD, Bruce     Excessive daytime sleepiness 02/19/2016   Hyperlipidemia    Hypertension    MI (mitral incompetence)    Nephrolithiasis    NEPHROLITHIASIS 06/26/2008   Qualifier: Diagnosis of  By: Linna Darner, CMA, Cindy     OSA (obstructive sleep apnea) 02/19/2016   Moderate to severe OSA with an AHI of 25/hr and on CPAP at 8cm H2O   PE (pulmonary embolism)    [facility]    Past Surgical History:  Procedure Laterality Date   BACK SURGERY     BASAL CELL CARCINOMA EXCISION     nose   BIV UPGRADE N/A 03/28/2017   Procedure: BiV Upgrade;  Surgeon: Duke Salvia, MD;  Location: [facility] INVASIVE CV LAB;  Service: Cardiovascular;  Laterality: N/A;   CARDIAC CATHETERIZATION N/A 01/20/2016   Procedure: Right/Left Heart Cath and Coronary Angiography;  Surgeon: Dolores Patty, MD;  Location: [facility] INVASIVE CV LAB;  Service: Cardiovascular;  Laterality: N/A;   CARDIAC DEFIBRILLATOR PLACEMENT     medtronic virtuoso   CHOLECYSTECTOMY     COLONOSCOPY  12/11/2012   Procedure: COLONOSCOPY;  Surgeon: Louis Meckel, MD;  Location: [facility] ENDOSCOPY;  Service: Endoscopy;  Laterality: N/A;   KNEE ARTHROPLASTY     Patient Active Problem List   Diagnosis Date Noted   Permanent atrial fibrillation (HCC) 05/10/2023   IVCD (intraventricular conduction defect) 05/10/2023   COPD (chronic obstructive pulmonary disease) (HCC) 12/26/2018   Osteoarthritis of right knee 09/22/2018   History of subdural hematoma 11/21/2017   Frontal headache 10/06/2017    Rhinitis 08/22/2017   Dyspnea on exertion 08/22/2017   Ischemic cardiomyopathy 03/28/2017   Chronic atrial fibrillation (HCC)    OSA (obstructive sleep apnea) 02/19/2016   Chronic systolic heart failure (HCC) 08/06/2015   LBBB (left bundle branch block) 08/06/2015   Carotid artery stenosis 03/17/2015   Former smoker 11/15/2014   Actinic keratosis 11/15/2014   History of cardioembolic cerebrovascular accident (CVA) 12/19/2012   Benign neoplasm of colon 12/11/2012   Implantable cardioverter-defibrillator (ICD) in situ 02/17/2012    ventricular tachycardia-non sustained  02/17/2012   History of skin cancer 07/05/2007   Diabetes mellitus type II, controlled (HCC) 10/16/2006   Hyperlipidemia associated with type 2 diabetes mellitus (HCC) 10/16/2006   Essential hypertension 10/16/2006   CAD (coronary artery disease) 10/16/2006   Chronic pulmonary embolism (HCC) 10/16/2006    PCP: Shelva Majestic, MD   REFERRING PROVIDER: Dolores Patty, MD   REFERRING DIAG:  I50.22 (ICD-10-CM) - Chronic systolic heart failure (HCC)  O53.664 (ICD-10-CM) - Weakness of both lower extremities    THERAPY DIAG:  No diagnosis found.  Rationale for Evaluation and Treatment: Rehabilitation  ONSET DATE: chronic  SUBJECTIVE:   SUBJECTIVE STATEMENT: Pt reports injection did not help his hip. Knee is better today.  He reports compliance with HEP 1 x per day , at the least.   EVAL: Pt reports over several years his legs have become weaker, his balance has  decreased, and his walking tolerance decreased. Pt denies falling. Pt will use a rollator in the community when he knows he will need stand or walk for prolong time frames. Pt reports a Hx of L hip pain which can interfere with his mobility at times.  PERTINENT HISTORY: CAD, chronic Afib, DM, Hx back surgery, Hx CVA, defibrillator  PAIN:  Are you having pain? Yes: NPRS scale: 5/10 sharp intermittent pains Pain location: L hip Pain  description: sharp, ache Aggravating factors: Sit to stand, prolonged sitting, standing, and walking Relieving factors: Rest, sleeps in a recliner  PRECAUTIONS: Other: Cardiopulmonary , defibrillator  RED FLAGS: None   WEIGHT BEARING RESTRICTIONS: No  FALLS:  Has patient fallen in last 6 months? No  LIVING ENVIRONMENT: Lives with: lives with their family Lives in: House/apartment No issue with accessing home or mobility within  OCCUPATION: Retired  PLOF: Independent  PATIENT GOALS: Improved leg strength, balance, and activity tolerance   OBJECTIVE:  Note: Objective measures were completed at Evaluation unless otherwise noted.  DIAGNOSTIC FINDINGS: NA  PATIENT SURVEYS:  FOTO: Perceived function   41%, predicted   50%   COGNITION: Overall cognitive status: Within functional limits for tasks assessed     SENSATION: WFL  EDEMA:  NT  MUSCLE LENGTH: Hamstrings: Right NT deg; Left NT deg Thomas test: Right NT deg; Left NT deg  POSTURE: rounded shoulders, forward head, increased lumbar lordosis, increased thoracic kyphosis, posterior pelvic tilt, and flexed trunk   PALPATION: NT  LOWER EXTREMITY MMT:  Active MMT Right eval Left eval  Hip flexion 4 4  Hip extension 3 3  Hip abduction 3+ 3+  Hip adduction    Hip internal rotation    Hip external rotation 4 4  Knee flexion 5 5  Knee extension 5 5  Ankle dorsiflexion 4 3  Ankle plantarflexion 4 3  Ankle inversion    Ankle eversion     (Blank rows = not tested)  LOWER EXTREMITY ROM:  ROM Right eval Left eval  Hip flexion    Hip extension Decreased Decreased  Hip abduction Decreased Decreased  Hip adduction    Hip internal rotation Decreased Decreased  Hip external rotation Decreased Decreased  Knee flexion    Knee extension    Ankle dorsiflexion Decreased Decreased  Ankle plantarflexion    Ankle inversion    Ankle eversion     (Blank rows = not tested)  LOWER EXTREMITY SPECIAL TESTS:  Hip  special tests: Luisa Hart (FABER) test: negative  FUNCTIONAL TESTS:  5 times sit to stand: 20.7" c use of hands 2 minute walk test: TBA: 10/13/23: 282 feet with SPC Single leg stand: L=1", R=5" Berg Balance: 48/56 10/13/23  GAIT: Distance walked: 150' Assistive device utilized: None Level of assistance: Complete Independence Comments: Decreased step length, decreased ankle DF,    TODAY'S TREATMENT:   [facility] Adult PT Treatment:                                                DATE: 10/31/23 Therapeutic Exercise: *** Manual Therapy: *** Neuromuscular re-ed: *** Therapeutic Activity: *** Modalities: *** Self Care: [facility] Adult PT Treatment:  DATE: 10/27/23 Therapeutic Exercise: Nustep L5 UE/LE x 5 minutes  Heel raises x 20 Hip abduction 10 x 2 yellow band Hip extension 10 x 2 yellow band  Half squats in // bars with UE x 10 Seated LAQ 5# 15 x 2 each  Therapeutic Activity: Tandem AIREX Pad: Tandem stance, tandem gait forward and retro with 1UE, side stepping without UE Alternating and unilateral step taps to 6 inch step  Manual perturbations in standing 4 directions pt able to maintain balance without assist.     [facility] Adult PT Treatment:                                                DATE: 10/24/23 Therapeutic Exercise: Nustep L4 x 5 min Heel toe raises Side stepping yellow  Standing hip ext yellow, forearm supported on counter 5 x 2  Sink Squats x 10 Tandem stance AIREX x 4 without UE Blue rocker A/P and lateral rocking x 1 min each without UE LAQ 2 x 15 5# Supine marching x15 each Bridge 10 x 2 Small Rom LTR Supine clam green band  Supine alternating clam with green band    [facility] Adult PT Treatment:                                                DATE: 10/20/23 Therapeutic Exercise: Nustep L5 UE/LE x 5 min Marching on airex x2 30' Tandem stance on AIREX 10-15 sec without UE x 4  Supine marching x15 each Bridge  x15 H/L hip clam 2x10 GTB Sink squat x 10  LAQ x15 4# Updated HEP Manual Therapy: Grade 3 L hip LAD      [facility] Adult PT Treatment:                                                DATE: 10/17/23 Therapeutic Exercise: Nustep L5 UE/Le x 5 min Side stepping at counter x 3 , added yellow band x 3  Standing hip ext yellow band x 10 each  Sink squat x 10  Standing March with 1 UE support  Tandem stance on AIREX 10-15 sec without UE x 4  A/P rocking on blue Rocker board without UE x 1 minute  STS from elevated seat x 8 - needs UE to rise Knee ext 10# x 10 bilat - pain in left hip so disc Supine with incline wedge: LTR ball bet knees x 10  AROM clam x 10 Partial Bridge x 10+ HEP  [facility] Adult PT Treatment:                                                DATE: 10/13/23 Therapeutic Activity:  2 MWT 282 feet STS from elevated seat x 6 Side stepping along counter 3 x down and back Tandem stance 45 sec- cues for posture SLS trials 5-10 sec with intermittent touch  BERG BALANCE TEST Sitting to Standing: 3.      Stands independently using hands Standing Unsupported: 4.      Stands safely for 2 minutes Sitting Unsupported: 4.     Sits for 2 minutes independently Standing to Sitting: 3.     Controls descent with hands  Transfers: 3.     Transfers safely definite use of hands Standing with eyes closed: 4.     Stands safely for 10 seconds  Standing with feet together: 4.     Stands for 1 minute safely Reaching forward with outstretched arm: 4.     Reaches forward 10 inches Retrieving object from the floor: 4.      Able to pick up easily and safely Turning to look behind: 4.     Looks behind from both sides and weight shifts well Turning 360 degrees: 2.     Able to turn slowly, but safely Place alternate foot on stool: 4.     Completes 8 steps in 20 seconds     Standing with one  foot in front: 3.     Independent foot ahead for 30 seconds Standing on one foot: 2.     Holds >/=3 seconds Total Score: 48/56   [facility] Adult PT Treatment:                                                DATE: 09/27/23 Therapeutic Exercise: Developed, instructed in, and pt completed therex as noted in HEP   PATIENT EDUCATION:  Education details: Eval findings, POC, HEP Person educated: Patient Education method: Explanation, Demonstration, Tactile cues, Verbal cues, and Handouts Education comprehension: verbalized understanding, returned demonstration, verbal cues required, and tactile cues required  HOME EXERCISE PROGRAM: Access Code: [email or web address] URL: [email or web address] Date: 10/20/2023 Prepared by: Joellyn Rued  Exercises - Standing Single Leg Stance with Counter Support  - 2-3 x daily - 7 x weekly - 1 sets - 5 reps - 20 hold - Standing Tandem Balance with Counter Support  - 1 x daily - 7 x weekly - 1 sets - 3 reps - 30 hold - Side Stepping with Counter Support  - 1 x daily - 7 x weekly - 1 sets - 3 reps - Mini Squat with Counter Support  - 1 x daily - 7 x weekly - 1-2 sets - 10 reps - 3 hold - Supine Bridge  - 1 x daily - 7 x weekly - 1-2 sets - 10 reps - 5 hold - Hooklying Isometric Clamshell  - 1 x daily - 7 x weekly - 2 sets - 10 reps - 3 hold  ASSESSMENT:  CLINICAL IMPRESSION: Pt reports hip pain with certain movements. Still not improvement since hip injection. Increased dynamic balance challenges today which he tolerated well. Also, pt remained in closed chain for entire session with seated rest breaks as needed. Pt tolerated PT today without adverse effects. Pt will continue to benefit from skilled PT to address impairments for improved functional mobility.   EVAL: Patient is a 82 y.o. male who was seen today for physical therapy evaluation and treatment for  I50.22 (ICD-10-CM) - Chronic systolic heart failure (HCC)  V95.638 (ICD-10-CM) - Weakness of both lower  extremities  .Pt presents to PT with decreased LE strength, decreased balance, decreased activity tolerance, postural changes impacting COG and balance, L hip pain, and  decreased tolerance to activity. Pt will benefit from skilled PT to address impairments to optimize functional mobility.   OBJECTIVE IMPAIRMENTS: decreased activity tolerance, decreased balance, decreased endurance, difficulty walking, decreased ROM, decreased strength, postural dysfunction, and pain.   ACTIVITY LIMITATIONS: carrying, lifting, bending, standing, squatting, sleeping, stairs, locomotion level, and caring for others  PARTICIPATION LIMITATIONS: meal prep, cleaning, laundry, driving, shopping, community activity, and yard work  PERSONAL FACTORS: Age, Fitness, Past/current experiences, Time since onset of injury/illness/exacerbation, and 1-2 comorbidities: cadiopulmonary issues, DM, Hx of back surgery  are also affecting patient's functional outcome.   REHAB POTENTIAL: Good  CLINICAL DECISION MAKING: Evolving/moderate complexity  EVALUATION COMPLEXITY: Moderate   GOALS:  SHORT TERM GOALS: Target date: 10/21/23 Pt will be Ind in an initial HEP  Baseline:started Goal status: MET  LONG TERM GOALS: Target date: 11/30/23  Pt will be Ind in a final HEP to maintain achieved LOF  Baseline:  Goal status: INITIAL  2.  Increase bilat hip strength and ankle strength by muscle grade for improved function and balance Baseline: see flow sheet Goal status: INITIAL  3.  Improve 5xSTS by MCID of 5" and by MCID of 66ft as indication of improved functional mobility  Baseline: 5xSTS 21.7 sec 10/13/23: 282 feet  Goal status: ONGOING  4.  Improve Berg balance by 5 pts as indication of improved balance Baseline: TBA 10/13/23: 48/56 Goal status: INITIAL  5.  Increase L LE single leg stand to 5" for improved balance Baseline: L 1", R 5" Goal status: INITIAL  6.  Pt's FOTO score will improved to the predicted value  of 50% as indication of improved function  Baseline: 41% Goal status: INITIAL   PLAN:  PT FREQUENCY: 2x/week  PT DURATION: 8 weeks  PLANNED INTERVENTIONS: [id number]- PT Re-evaluation, 97110-Therapeutic exercises, 97530- Therapeutic activity, 97112- Neuromuscular re-education, 97535- Self Care, [id number]- Manual therapy, [phone]- Gait training, Patient/Family education, Balance training, Stair training, Joint manipulation, Cryotherapy, and Moist heat  PLAN FOR NEXT SESSION:  Review FOTO; assess response to HEP; progress therex as indicated; use of modalities, manual therapy; and TPDN as indicated.  Jannette Spanner, PTA 10/31/23 7:48 AM Phone: [phone] Fax: [phone]

## 2023-11-07 ENCOUNTER — Encounter: Payer: Self-pay | Admitting: Physical Therapy

## 2023-11-07 ENCOUNTER — Ambulatory Visit: Payer: Medicare HMO | Attending: Internal Medicine | Admitting: Physical Therapy

## 2023-11-07 DIAGNOSIS — R293 Abnormal posture: Secondary | ICD-10-CM

## 2023-11-07 DIAGNOSIS — M6281 Muscle weakness (generalized): Secondary | ICD-10-CM

## 2023-11-07 DIAGNOSIS — R262 Difficulty in walking, not elsewhere classified: Secondary | ICD-10-CM

## 2023-11-07 NOTE — Therapy (Signed)
OUTPATIENT PHYSICAL THERAPY LOWER EXTREMITY TREATMENT   Patient Name: Carl Wood MRN: [id number] DOB:[birth year], 82 y.o., adult Today's Date: 11/07/2023  END OF SESSION:  PT End of Session - 11/07/23 1040     Visit Number 8    Number of Visits 17    Date for PT Re-Evaluation 11/30/23    Authorization Type [employer]    Authorization - Visit Number 7    Authorization - Number of Visits 10    PT Start Time 1100    PT Stop Time 1145    PT Time Calculation (min) 45 min    Activity Tolerance Patient tolerated treatment well    Behavior During Therapy WFL for tasks assessed/performed               Past Medical History:  Diagnosis Date   CAD (coronary artery disease)    CHF (congestive heart failure) (HCC)    Chronic atrial fibrillation (HCC)    Colon polyps    Diabetes mellitus    Diverticulosis of colon    DIVERTICULOSIS, COLON 10/16/2006   Qualifier: Diagnosis of  By: Cato Mulligan MD, Bruce     Excessive daytime sleepiness 02/19/2016   Hyperlipidemia    Hypertension    MI (mitral incompetence)    Nephrolithiasis    NEPHROLITHIASIS 06/26/2008   Qualifier: Diagnosis of  By: Linna Darner, CMA, Cindy     OSA (obstructive sleep apnea) 02/19/2016   Moderate to severe OSA with an AHI of 25/hr and on CPAP at 8cm H2O   PE (pulmonary embolism)    V-tach [facility])    Past Surgical History:  Procedure Laterality Date   BACK SURGERY     BASAL CELL CARCINOMA EXCISION     nose   BIV UPGRADE N/A 03/28/2017   Procedure: BiV Upgrade;  Surgeon: Duke Salvia, MD;  Location: [facility] INVASIVE CV LAB;  Service: Cardiovascular;  Laterality: N/A;   CARDIAC CATHETERIZATION N/A 01/20/2016   Procedure: Right/Left Heart Cath and Coronary Angiography;  Surgeon: Dolores Patty, MD;  Location: [facility] INVASIVE CV LAB;  Service: Cardiovascular;  Laterality: N/A;   CARDIAC DEFIBRILLATOR PLACEMENT     medtronic virtuoso   CHOLECYSTECTOMY     COLONOSCOPY  12/11/2012   Procedure: COLONOSCOPY;  Surgeon:  Louis Meckel, MD;  Location: [facility] ENDOSCOPY;  Service: Endoscopy;  Laterality: N/A;   KNEE ARTHROPLASTY     Patient Active Problem List   Diagnosis Date Noted   Permanent atrial fibrillation (HCC) 05/10/2023   IVCD (intraventricular conduction defect) 05/10/2023   COPD (chronic obstructive pulmonary disease) (HCC) 12/26/2018   Osteoarthritis of right knee 09/22/2018   History of subdural hematoma 11/21/2017   Frontal headache 10/06/2017   Rhinitis 08/22/2017   Dyspnea on exertion 08/22/2017   Ischemic cardiomyopathy 03/28/2017   Chronic atrial fibrillation (HCC)    OSA (obstructive sleep apnea) 02/19/2016   Chronic systolic heart failure (HCC) 08/06/2015   LBBB (left bundle branch block) 08/06/2015   Carotid artery stenosis 03/17/2015   Former smoker 11/15/2014   Actinic keratosis 11/15/2014   History of cardioembolic cerebrovascular accident (CVA) 12/19/2012   Benign neoplasm of colon 12/11/2012   Implantable cardioverter-defibrillator (ICD) in situ 02/17/2012    ventricular tachycardia-non sustained  02/17/2012   History of skin cancer 07/05/2007   Diabetes mellitus type II, controlled (HCC) 10/16/2006   Hyperlipidemia associated with type 2 diabetes mellitus (HCC) 10/16/2006   Essential hypertension 10/16/2006   CAD (coronary artery disease) 10/16/2006   Chronic pulmonary embolism (HCC)  10/16/2006    PCP: Shelva Majestic, MD   REFERRING PROVIDER: Dolores Patty, MD   REFERRING DIAG:  I50.22 (ICD-10-CM) - Chronic systolic heart failure (HCC)  Z61.096 (ICD-10-CM) - Weakness of both lower extremities    THERAPY DIAG:  Difficulty in walking, not elsewhere classified  Abnormal posture  Muscle weakness (generalized)  Rationale for Evaluation and Treatment: Rehabilitation  ONSET DATE: chronic  SUBJECTIVE:   SUBJECTIVE STATEMENT: Hip is 2/10 but when I pick it up or move it the wrong way.  I think I'm better in getting up and down.    EVAL: Pt reports  over several years his legs have become weaker, his balance has decreased, and his walking tolerance decreased. Pt denies falling. Pt will use a rollator in the community when he knows he will need stand or walk for prolong time frames. Pt reports a Hx of L hip pain which can interfere with his mobility at times.  PERTINENT HISTORY: CAD, chronic Afib, DM, Hx back surgery, Hx CVA, defibrillator  PAIN:  Are you having pain? Yes: NPRS scale: current 2/10.  Can be 9/10 at times Pain location: L hip Pain description: sharp, ache Aggravating factors: Sit to stand, prolonged sitting, standing, and walking Relieving factors: Rest, sleeps in a recliner  PRECAUTIONS: Other: Cardiopulmonary , defibrillator  RED FLAGS: None   WEIGHT BEARING RESTRICTIONS: No  FALLS:  Has patient fallen in last 6 months? No  LIVING ENVIRONMENT: Lives with: lives with their family Lives in: House/apartment No issue with accessing home or mobility within  OCCUPATION: Retired  PLOF: Independent  PATIENT GOALS: Improved leg strength, balance, and activity tolerance   OBJECTIVE:  Note: Objective measures were completed at Evaluation unless otherwise noted.  DIAGNOSTIC FINDINGS: NA  PATIENT SURVEYS:  FOTO: Perceived function   41%, predicted   50%  FOTO 47% 10/31/23  COGNITION: Overall cognitive status: Within functional limits for tasks assessed     SENSATION: WFL  EDEMA:  NT  MUSCLE LENGTH: Hamstrings: Right NT deg; Left NT deg Maisie Fus test: Right NT deg; Left NT deg  POSTURE: rounded shoulders, forward head, increased lumbar lordosis, increased thoracic kyphosis, posterior pelvic tilt, and flexed trunk   PALPATION: NT  LOWER EXTREMITY MMT:  Active MMT Right eval Left eval Rt./Lt. 11/07/23  Hip flexion 4 4 4+/5  Hip extension 3 3 3+/5  Hip abduction 3+ 3+ 3+/5  Hip adduction     Hip internal rotation     Hip external rotation 4 4   Knee flexion 5 5 5/5  Knee extension 5 5 5/5   Ankle dorsiflexion 4 3 5/5  Ankle plantarflexion 4 3   Ankle inversion     Ankle eversion      (Blank rows = not tested)  LOWER EXTREMITY ROM:  ROM Right eval Left eval  Hip flexion    Hip extension Decreased Decreased  Hip abduction Decreased Decreased  Hip adduction    Hip internal rotation Decreased Decreased  Hip external rotation Decreased Decreased  Knee flexion    Knee extension    Ankle dorsiflexion Decreased Decreased  Ankle plantarflexion    Ankle inversion    Ankle eversion     (Blank rows = not tested)  LOWER EXTREMITY SPECIAL TESTS:  Hip special tests: Luisa Hart (FABER) test: negative  FUNCTIONAL TESTS:  5 times sit to stand: 20.7" c use of hands  11/07/23: 13.62 sec used hands  2 minute walk test: TBA: 10/13/23: 282 feet with SPC Single leg  stand: L=1", R=5": 10/31/23: 5 sec left, 10 sec right Berg Balance: 48/56 10/13/23  GAIT: Distance walked: 150' Assistive device utilized: None Level of assistance: Complete Independence Comments: Decreased step length, decreased ankle DF,    TODAY'S TREATMENT:   [facility] Adult PT Treatment:                                                DATE: 11/07/23 Therapeutic Exercise: Nustep L6 x 6 min  Sit to stand x 5 for time  Sit to stand no UEs x 10 x  2 with table elevated Standing hip extension no band then with band x 15 each  Standing hip abduction cues needed x 15  Hip flexor stretch standing, limited  Supine hamstring stretch with A using strap Lower trunk rotation  x 10 for L hip  Bridge x 10   Manual Therapy: Soft tissue to to L rectus femoris on stretch (thomas test position)   Therapeutic Activity: Check MMT 2 min walk test (next visit)  Self Care: Treatment to L hip as part of PT for pain relief    [facility] Adult PT Treatment:                                                DATE: 10/31/23 Therapeutic Exercise: Nustep L5 x 6 min  Heel and toe raises Sink squats x 10 Hip abduction 10 x 2 yellow band Hip  extension 10 x 2 yellow band 6 inch step up with counter support x 10 each  Bridge 10 x 2  Hooklying clam GTB 10 x 2  Hooklying March GTB 10 x 2   Therapeutic Activity: SLS 10 sec right, 5 sec left Tandem stance 60 sec, 35 sec Tandem gait along counter  STS x 10 bariatric chair with AIREX to elevate, without UE- cues to control descent.     [facility] Adult PT Treatment:                                                DATE: 10/27/23 Therapeutic Exercise: Nustep L5 UE/LE x 5 minutes  Heel raises x 20 Hip abduction 10 x 2 yellow band Hip extension 10 x 2 yellow band  Half squats in // bars with UE x 10 Seated LAQ 5# 15 x 2 each  Therapeutic Activity: Tandem AIREX Pad: Tandem stance, tandem gait forward and retro with 1UE, side stepping without UE Alternating and unilateral step taps to 6 inch step  Manual perturbations in standing 4 directions pt able to maintain balance without assist.     [facility] Adult PT Treatment:                                                DATE: 10/24/23 Therapeutic Exercise: Nustep L4 x 5 min Heel toe raises Side stepping yellow  Standing hip ext yellow, forearm supported on counter 5 x 2  Sink Squats x 10 Tandem stance AIREX x 4  without UE Blue rocker A/P and lateral rocking x 1 min each without UE LAQ 2 x 15 5# Supine marching x15 each Bridge 10 x 2 Small Rom LTR Supine clam green band  Supine alternating clam with green band    [facility] Adult PT Treatment:                                                DATE: 10/20/23 Therapeutic Exercise: Nustep L5 UE/LE x 5 min Marching on airex x2 30' Tandem stance on AIREX 10-15 sec without UE x 4  Supine marching x15 each Bridge x15 H/L hip clam 2x10 GTB Sink squat x 10  LAQ x15 4# Updated HEP Manual Therapy: Grade 3 L hip LAD       PATIENT EDUCATION:  Education details: Eval findings, POC, HEP Person educated: Patient Education method: Explanation, Demonstration, Tactile cues, Verbal cues, and  Handouts Education comprehension: verbalized understanding, returned demonstration, verbal cues required, and tactile cues required  HOME EXERCISE PROGRAM: Access Code: [email or web address] URL: [email or web address] Date: 10/20/2023 Prepared by: Joellyn Rued  Exercises - Standing Single Leg Stance with Counter Support  - 2-3 x daily - 7 x weekly - 1 sets - 5 reps - 20 hold - Standing Tandem Balance with Counter Support  - 1 x daily - 7 x weekly - 1 sets - 3 reps - 30 hold - Side Stepping with Counter Support  - 1 x daily - 7 x weekly - 1 sets - 3 reps - Mini Squat with Counter Support  - 1 x daily - 7 x weekly - 1-2 sets - 10 reps - 3 hold - Supine Bridge  - 1 x daily - 7 x weekly - 1-2 sets - 10 reps - 5 hold - Hooklying Isometric Clamshell  - 1 x daily - 7 x weekly - 2 sets - 10 reps - 3 hold  ASSESSMENT:  CLINICAL IMPRESSION: Patient overall doing well.  He complains of left-sided anterolateral hip pain throughout the session with transitions.  At home he limits the exercises done in supine due to pain.  He requires cueing for the standing leg exercises for posture.  Offered some stretching to the left anterior hip today as well as manual therapy.  He has improved his overall leg strength but still very limited in his ability to stand upright.  Treating the left hip may help his overall mobility and safety in standing. Pt will continue to benefit from skilled PT to address impairments for improved functional mobility.   EVAL: Patient is a 82 y.o. male who was seen today for physical therapy evaluation and treatment for  I50.22 (ICD-10-CM) - Chronic systolic heart failure (HCC)  V40.981 (ICD-10-CM) - Weakness of both lower extremities  .Pt presents to PT with decreased LE strength, decreased balance, decreased activity tolerance, postural changes impacting COG and balance, L hip pain, and decreased tolerance to activity. Pt will benefit from skilled PT to address impairments to optimize  functional mobility.   OBJECTIVE IMPAIRMENTS: decreased activity tolerance, decreased balance, decreased endurance, difficulty walking, decreased ROM, decreased strength, postural dysfunction, and pain.   ACTIVITY LIMITATIONS: carrying, lifting, bending, standing, squatting, sleeping, stairs, locomotion level, and caring for others  PARTICIPATION LIMITATIONS: meal prep, cleaning, laundry, driving, shopping, community activity, and yard work  PERSONAL FACTORS: Age, Fitness, Past/current experiences, Time since onset of injury/illness/exacerbation, and  1-2 comorbidities: cadiopulmonary issues, DM, Hx of back surgery  are also affecting patient's functional outcome.   REHAB POTENTIAL: Good  CLINICAL DECISION MAKING: Evolving/moderate complexity  EVALUATION COMPLEXITY: Moderate   GOALS:  SHORT TERM GOALS: Target date: 10/21/23 Pt will be Ind in an initial HEP  Baseline:started Goal status: MET  LONG TERM GOALS: Target date: 11/30/23  Pt will be Ind in a final HEP to maintain achieved LOF  Baseline:  Goal status: In progress  2.  Increase bilat hip strength and ankle strength by muscle grade for improved function and balance Baseline: see flow sheet Goal status: Not  3.  Improve 5xSTS by MCID of 5" and by MCID of 56ft as indication of improved functional mobility  Baseline: 5xSTS 21.7 sec 10/13/23: 282 feet  Goal status: In progress  4.  Improve Berg balance by 5 pts as indication of improved balance Baseline: TBA 10/13/23: 48/56 Goal status: In progress  5.  Increase L LE single leg stand to 5" for improved balance Baseline: L 1", R 5" 10/31/23: 5 sec  Goal status: In progress  6.  Pt's FOTO score will improved to the predicted value of 50% as indication of improved function  Baseline: 41% 10/31/23: 47% Goal status: In progress  PLAN:  PT FREQUENCY: 2x/week  PT DURATION: 8 weeks  PLANNED INTERVENTIONS: 97164- PT Re-evaluation, 97110-Therapeutic exercises,  97530- Therapeutic activity, 97112- Neuromuscular re-education, 97535- Self Care, 96295- Manual therapy, [phone]- Gait training, Patient/Family education, Balance training, Stair training, Joint manipulation, Cryotherapy, and Moist heat  PLAN FOR NEXT SESSION:  manual to L hip. assess response to HEP; progress therex as indicated; use of modalities, manual therapy; and TPDN as indicated. Check 2 min walk test.  Add in more hip stretching focus if tolerated.    Karie Mainland, PT 11/07/23 12:02 PM Phone: [phone] Fax: [phone]

## 2023-11-10 ENCOUNTER — Ambulatory Visit: Payer: Medicare HMO | Admitting: Physical Therapy

## 2023-11-10 ENCOUNTER — Encounter: Payer: Self-pay | Admitting: Physical Therapy

## 2023-11-10 DIAGNOSIS — L578 Other skin changes due to chronic exposure to nonionizing radiation: Secondary | ICD-10-CM | POA: Diagnosis not present

## 2023-11-10 DIAGNOSIS — Z85828 Personal history of other malignant neoplasm of skin: Secondary | ICD-10-CM | POA: Diagnosis not present

## 2023-11-10 DIAGNOSIS — M6281 Muscle weakness (generalized): Secondary | ICD-10-CM

## 2023-11-10 DIAGNOSIS — R262 Difficulty in walking, not elsewhere classified: Secondary | ICD-10-CM | POA: Diagnosis not present

## 2023-11-10 DIAGNOSIS — Z08 Encounter for follow-up examination after completed treatment for malignant neoplasm: Secondary | ICD-10-CM | POA: Diagnosis not present

## 2023-11-10 DIAGNOSIS — L905 Scar conditions and fibrosis of skin: Secondary | ICD-10-CM | POA: Diagnosis not present

## 2023-11-10 DIAGNOSIS — R293 Abnormal posture: Secondary | ICD-10-CM

## 2023-11-10 DIAGNOSIS — L570 Actinic keratosis: Secondary | ICD-10-CM | POA: Diagnosis not present

## 2023-11-10 NOTE — Therapy (Signed)
OUTPATIENT PHYSICAL THERAPY LOWER EXTREMITY TREATMENT   Patient Name: Carl Wood MRN: [id number] DOB:Mar 08, 1941, 82 y.o., adult Today's Date: 11/10/2023  END OF SESSION:  PT End of Session - 11/10/23 1249     Visit Number 9    Number of Visits 17    Date for PT Re-Evaluation 11/30/23    Authorization Type [employer]    Authorization Time Period Approved 10 PT visits from 10/10/23-11/30/23    Authorization - Visit Number 8    Authorization - Number of Visits 10    PT Start Time 1100    PT Stop Time 1145    PT Time Calculation (min) 45 min                Past Medical History:  Diagnosis Date   CAD (coronary artery disease)    CHF (congestive heart failure) (HCC)    Chronic atrial fibrillation (HCC)    Colon polyps    Diabetes mellitus    Diverticulosis of colon    DIVERTICULOSIS, COLON 10/16/2006   Qualifier: Diagnosis of  By: Cato Mulligan MD, Bruce     Excessive daytime sleepiness 02/19/2016   Hyperlipidemia    Hypertension    MI (mitral incompetence)    Nephrolithiasis    NEPHROLITHIASIS 06/26/2008   Qualifier: Diagnosis of  By: Linna Darner, CMA, Cindy     OSA (obstructive sleep apnea) 02/19/2016   Moderate to severe OSA with an AHI of 25/hr and on CPAP at 8cm H2O   PE (pulmonary embolism)    [facility])    Past Surgical History:  Procedure Laterality Date   BACK SURGERY     BASAL CELL CARCINOMA EXCISION     nose   BIV UPGRADE N/A 03/28/2017   Procedure: BiV Upgrade;  Surgeon: Duke Salvia, MD;  Location: [facility] INVASIVE CV LAB;  Service: Cardiovascular;  Laterality: N/A;   CARDIAC CATHETERIZATION N/A 01/20/2016   Procedure: Right/Left Heart Cath and Coronary Angiography;  Surgeon: Dolores Patty, MD;  Location: [facility] INVASIVE CV LAB;  Service: Cardiovascular;  Laterality: N/A;   CARDIAC DEFIBRILLATOR PLACEMENT     medtronic virtuoso   CHOLECYSTECTOMY     COLONOSCOPY  12/11/2012   Procedure: COLONOSCOPY;  Surgeon: Louis Meckel, MD;  Location: [facility]  ENDOSCOPY;  Service: Endoscopy;  Laterality: N/A;   KNEE ARTHROPLASTY     Patient Active Problem List   Diagnosis Date Noted   Permanent atrial fibrillation (HCC) 05/10/2023   IVCD (intraventricular conduction defect) 05/10/2023   COPD (chronic obstructive pulmonary disease) (HCC) 12/26/2018   Osteoarthritis of right knee 09/22/2018   History of subdural hematoma 11/21/2017   Frontal headache 10/06/2017   Rhinitis 08/22/2017   Dyspnea on exertion 08/22/2017   Ischemic cardiomyopathy 03/28/2017   Chronic atrial fibrillation (HCC)    OSA (obstructive sleep apnea) 02/19/2016   Chronic systolic heart failure (HCC) 08/06/2015   LBBB (left bundle branch block) 08/06/2015   Carotid artery stenosis 03/17/2015   Former smoker 11/15/2014   Actinic keratosis 11/15/2014   History of cardioembolic cerebrovascular accident (CVA) 12/19/2012   Benign neoplasm of colon 12/11/2012   Implantable cardioverter-defibrillator (ICD) in situ 02/17/2012    ventricular tachycardia-non sustained  02/17/2012   History of skin cancer 07/05/2007   Diabetes mellitus type II, controlled (HCC) 10/16/2006   Hyperlipidemia associated with type 2 diabetes mellitus (HCC) 10/16/2006   Essential hypertension 10/16/2006   CAD (coronary artery disease) 10/16/2006   Chronic pulmonary embolism (HCC) 10/16/2006    PCP: Durene Cal,  Aldine Contes, MD   REFERRING PROVIDER: Dolores Patty, MD   REFERRING DIAG:  I50.22 (ICD-10-CM) - Chronic systolic heart failure (HCC)  Z61.096 (ICD-10-CM) - Weakness of both lower extremities    THERAPY DIAG:  Difficulty in walking, not elsewhere classified  Abnormal posture  Muscle weakness (generalized)  Rationale for Evaluation and Treatment: Rehabilitation  ONSET DATE: chronic  SUBJECTIVE:   SUBJECTIVE STATEMENT: Hip can be 8-9/10 pain when it hits me.   EVAL: Pt reports over several years his legs have become weaker, his balance has decreased, and his walking tolerance  decreased. Pt denies falling. Pt will use a rollator in the community when he knows he will need stand or walk for prolong time frames. Pt reports a Hx of L hip pain which can interfere with his mobility at times.  PERTINENT HISTORY: CAD, chronic Afib, DM, Hx back surgery, Hx CVA, defibrillator  PAIN:  Are you having pain? Yes: NPRS scale: current 2/10.  Can be 9/10 at times Pain location: L hip Pain description: sharp, ache Aggravating factors: Sit to stand, prolonged sitting, standing, and walking Relieving factors: Rest, sleeps in a recliner  PRECAUTIONS: Other: Cardiopulmonary , defibrillator  RED FLAGS: None   WEIGHT BEARING RESTRICTIONS: No  FALLS:  Has patient fallen in last 6 months? No  LIVING ENVIRONMENT: Lives with: lives with their family Lives in: House/apartment No issue with accessing home or mobility within  OCCUPATION: Retired  PLOF: Independent  PATIENT GOALS: Improved leg strength, balance, and activity tolerance   OBJECTIVE:  Note: Objective measures were completed at Evaluation unless otherwise noted.  DIAGNOSTIC FINDINGS: NA  PATIENT SURVEYS:  FOTO: Perceived function   41%, predicted   50%  FOTO 47% 10/31/23  COGNITION: Overall cognitive status: Within functional limits for tasks assessed     SENSATION: WFL  EDEMA:  NT  MUSCLE LENGTH: Hamstrings: Right NT deg; Left NT deg Maisie Fus test: Right NT deg; Left NT deg  POSTURE: rounded shoulders, forward head, increased lumbar lordosis, increased thoracic kyphosis, posterior pelvic tilt, and flexed trunk   PALPATION: NT  LOWER EXTREMITY MMT:  Active MMT Right eval Left eval Rt./Lt. 11/07/23  Hip flexion 4 4 4+/5  Hip extension 3 3 3+/5  Hip abduction 3+ 3+ 3+/5  Hip adduction     Hip internal rotation     Hip external rotation 4 4   Knee flexion 5 5 5/5  Knee extension 5 5 5/5  Ankle dorsiflexion 4 3 5/5  Ankle plantarflexion 4 3   Ankle inversion     Ankle eversion       (Blank rows = not tested)  LOWER EXTREMITY ROM:  ROM Right eval Left eval  Hip flexion    Hip extension Decreased Decreased  Hip abduction Decreased Decreased  Hip adduction    Hip internal rotation Decreased Decreased  Hip external rotation Decreased Decreased  Knee flexion    Knee extension    Ankle dorsiflexion Decreased Decreased  Ankle plantarflexion    Ankle inversion    Ankle eversion     (Blank rows = not tested)  LOWER EXTREMITY SPECIAL TESTS:  Hip special tests: Luisa Hart (FABER) test: negative  FUNCTIONAL TESTS:  5 times sit to stand: 20.7" c use of hands  11/07/23: 13.62 sec used hands  2 minute walk test: TBA: 10/13/23: 282 feet with SPC; 11/10/23: 281 feet with SPC Single leg stand: L=1", R=5": 10/31/23: 5 sec left, 10 sec right Berg Balance: 48/56 10/13/23  GAIT: Distance walked:  150' Assistive device utilized: None Level of assistance: Complete Independence Comments: Decreased step length, decreased ankle DF,    TODAY'S TREATMENT:  [facility] Adult PT Treatment:                                                DATE: 11/10/23 Therapeutic Exercise: Nustep L5 x 8 min Supine clam AROM LTR 10 x 2  Bridge 10 x 2  Left heel slides in slide board Left Quad set  Left SAQ Left mod thomas stretch using step  Side left clam 10 x 2  Side left hip abduction x 8 Single leg bridge x 10 each  Therapeutic Activity: 280 feet (unchanged)     [facility] Adult PT Treatment:                                                DATE: 11/07/23 Therapeutic Exercise: Nustep L6 x 6 min  Sit to stand x 5 for time  Sit to stand no UEs x 10 x  2 with table elevated Standing hip extension no band then with band x 15 each  Standing hip abduction cues needed x 15  Hip flexor stretch standing, limited  Supine hamstring stretch with A using strap Lower trunk rotation  x 10 for L hip  Bridge x 10   Manual Therapy: Soft tissue to to L rectus femoris on stretch (thomas test position)    Therapeutic Activity: Check MMT 2 min walk test (next visit)  Self Care: Treatment to L hip as part of PT for pain relief    [facility] Adult PT Treatment:                                                DATE: 10/31/23 Therapeutic Exercise: Nustep L5 x 6 min  Heel and toe raises Sink squats x 10 Hip abduction 10 x 2 yellow band Hip extension 10 x 2 yellow band 6 inch step up with counter support x 10 each  Bridge 10 x 2  Hooklying clam GTB 10 x 2  Hooklying March GTB 10 x 2   Therapeutic Activity: SLS 10 sec right, 5 sec left Tandem stance 60 sec, 35 sec Tandem gait along counter  STS x 10 bariatric chair with AIREX to elevate, without UE- cues to control descent.     [facility] Adult PT Treatment:                                                DATE: 10/27/23 Therapeutic Exercise: Nustep L5 UE/LE x 5 minutes  Heel raises x 20 Hip abduction 10 x 2 yellow band Hip extension 10 x 2 yellow band  Half squats in // bars with UE x 10 Seated LAQ 5# 15 x 2 each  Therapeutic Activity: Tandem AIREX Pad: Tandem stance, tandem gait forward and retro with 1UE, side stepping without UE Alternating and unilateral step taps to 6 inch step  Manual perturbations  in standing 4 directions pt able to maintain balance without assist.     [facility] Adult PT Treatment:                                                DATE: 10/24/23 Therapeutic Exercise: Nustep L4 x 5 min Heel toe raises Side stepping yellow  Standing hip ext yellow, forearm supported on counter 5 x 2  Sink Squats x 10 Tandem stance AIREX x 4 without UE Blue rocker A/P and lateral rocking x 1 min each without UE LAQ 2 x 15 5# Supine marching x15 each Bridge 10 x 2 Small Rom LTR Supine clam green band  Supine alternating clam with green band           PATIENT EDUCATION:  Education details: Eval findings, POC, HEP Person educated: Patient Education method: Explanation, Demonstration, Tactile cues, Verbal cues, and  Handouts Education comprehension: verbalized understanding, returned demonstration, verbal cues required, and tactile cues required  HOME EXERCISE PROGRAM: Access Code: [email or web address] URL: [email or web address] Date: 10/20/2023 Prepared by: Joellyn Rued  Exercises - Standing Single Leg Stance with Counter Support  - 2-3 x daily - 7 x weekly - 1 sets - 5 reps - 20 hold - Standing Tandem Balance with Counter Support  - 1 x daily - 7 x weekly - 1 sets - 3 reps - 30 hold - Side Stepping with Counter Support  - 1 x daily - 7 x weekly - 1 sets - 3 reps - Mini Squat with Counter Support  - 1 x daily - 7 x weekly - 1-2 sets - 10 reps - 3 hold - Supine Bridge  - 1 x daily - 7 x weekly - 1-2 sets - 10 reps - 5 hold - Hooklying Isometric Clamshell  - 1 x daily - 7 x weekly - 2 sets - 10 reps - 3 hold  ASSESSMENT:  CLINICAL IMPRESSION: 2 MWT is unchanged. Pt continues with high level of left hip pain and intermittent buckling of left knee. He is wearing a brace on his knee today. Worked on quad activation and anterior hip stretching to address hip and knee deficits. At end of session he reported less hip pain with the transition to standing and walking.  Pt will continue to benefit from skilled PT to address impairments for improved functional mobility.   EVAL: Patient is a 82 y.o. male who was seen today for physical therapy evaluation and treatment for  I50.22 (ICD-10-CM) - Chronic systolic heart failure (HCC)  Y78.295 (ICD-10-CM) - Weakness of both lower extremities  .Pt presents to PT with decreased LE strength, decreased balance, decreased activity tolerance, postural changes impacting COG and balance, L hip pain, and decreased tolerance to activity. Pt will benefit from skilled PT to address impairments to optimize functional mobility.   OBJECTIVE IMPAIRMENTS: decreased activity tolerance, decreased balance, decreased endurance, difficulty walking, decreased ROM, decreased strength,  postural dysfunction, and pain.   ACTIVITY LIMITATIONS: carrying, lifting, bending, standing, squatting, sleeping, stairs, locomotion level, and caring for others  PARTICIPATION LIMITATIONS: meal prep, cleaning, laundry, driving, shopping, community activity, and yard work  PERSONAL FACTORS: Age, Fitness, Past/current experiences, Time since onset of injury/illness/exacerbation, and 1-2 comorbidities: cadiopulmonary issues, DM, Hx of back surgery  are also affecting patient's functional outcome.   REHAB POTENTIAL: Good  CLINICAL DECISION MAKING: Evolving/moderate complexity  EVALUATION COMPLEXITY:  Moderate   GOALS:  SHORT TERM GOALS: Target date: 10/21/23 Pt will be Ind in an initial HEP  Baseline:started Goal status: MET  LONG TERM GOALS: Target date: 11/30/23  Pt will be Ind in a final HEP to maintain achieved LOF  Baseline:  Goal status: In progress  2.  Increase bilat hip strength and ankle strength by muscle grade for improved function and balance Baseline: see flow sheet Goal status: Not  3.  Improve 5xSTS by MCID of 5" and by MCID of 8ft as indication of improved functional mobility  Baseline: 5xSTS 21.7 sec 10/13/23: 282 feet  Goal status: In progress  4.  Improve Berg balance by 5 pts as indication of improved balance Baseline: TBA 10/13/23: 48/56 Goal status: In progress  5.  Increase L LE single leg stand to 5" for improved balance Baseline: L 1", R 5" 10/31/23: 5 sec  Goal status: In progress  6.  Pt's FOTO score will improved to the predicted value of 50% as indication of improved function  Baseline: 41% 10/31/23: 47% Goal status: In progress  PLAN:  PT FREQUENCY: 2x/week  PT DURATION: 8 weeks  PLANNED INTERVENTIONS: [id number]- PT Re-evaluation, 97110-Therapeutic exercises, 97530- Therapeutic activity, 97112- Neuromuscular re-education, 97535- Self Care, [id number]- Manual therapy, [phone]- Gait training, Patient/Family education, Balance training,  Stair training, Joint manipulation, Cryotherapy, and Moist heat  PLAN FOR NEXT SESSION:  manual to L hip. assess response to HEP; progress therex as indicated; use of modalities, manual therapy; and TPDN as indicated. Add in more hip stretching focus if tolerated.    Jannette Spanner, PTA 11/10/23 1:00 PM Phone: [phone] Fax: [phone]

## 2023-11-14 ENCOUNTER — Ambulatory Visit: Payer: Medicare HMO | Admitting: Physical Therapy

## 2023-11-14 ENCOUNTER — Encounter: Payer: Self-pay | Admitting: Physical Therapy

## 2023-11-14 DIAGNOSIS — R293 Abnormal posture: Secondary | ICD-10-CM | POA: Diagnosis not present

## 2023-11-14 DIAGNOSIS — M6281 Muscle weakness (generalized): Secondary | ICD-10-CM | POA: Diagnosis not present

## 2023-11-14 DIAGNOSIS — R262 Difficulty in walking, not elsewhere classified: Secondary | ICD-10-CM | POA: Diagnosis not present

## 2023-11-14 NOTE — Therapy (Addendum)
OUTPATIENT PHYSICAL THERAPY LOWER EXTREMITY TREATMENT/Progress Note/Discharge   Patient Name: Carl Wood MRN: [id number] DOB:[birth year], 82 y.o., adult Today's Date: 11/14/2023  Progress Note  Reporting Period 09/27/23 to 11/14/23  See note below for Objective Data and Assessment of Progress/Goals.    END OF SESSION:  PT End of Session - 11/14/23 1105     Visit Number 10    Number of Visits 17    Date for PT Re-Evaluation 11/30/23    Authorization Type [employer] H[employer]    Authorization Time Period Approved 10 PT visits from 10/10/23-11/30/23    Authorization - Visit Number 9    Authorization - Number of Visits 10    PT Start Time 1100    PT Stop Time 1140    PT Time Calculation (min) 40 min                Past Medical History:  Diagnosis Date   CAD (coronary artery disease)    CHF (congestive heart failure) (HCC)    Chronic atrial fibrillation (HCC)    Colon polyps    Diabetes mellitus    Diverticulosis of colon    DIVERTICULOSIS, COLON 10/16/2006   Qualifier: Diagnosis of  By: Cato Mulligan MD, Bruce     Excessive daytime sleepiness 02/19/2016   Hyperlipidemia    Hypertension    MI (mitral incompetence)    Nephrolithiasis    NEPHROLITHIASIS 06/26/2008   Qualifier: Diagnosis of  By: Linna Darner, CMA, Cindy     OSA (obstructive sleep apnea) 02/19/2016   Moderate to severe OSA with an AHI of 25/hr and on CPAP at 8cm H2O   PE (pulmonary embolism)    V-tach [facility])    Past Surgical History:  Procedure Laterality Date   BACK SURGERY     BASAL CELL CARCINOMA EXCISION     nose   BIV UPGRADE N/A 03/28/2017   Procedure: BiV Upgrade;  Surgeon: Duke Salvia, MD;  Location: [facility] INVASIVE CV LAB;  Service: Cardiovascular;  Laterality: N/A;   CARDIAC CATHETERIZATION N/A 01/20/2016   Procedure: Right/Left Heart Cath and Coronary Angiography;  Surgeon: Dolores Patty, MD;  Location: [facility] INVASIVE CV LAB;  Service: Cardiovascular;  Laterality: N/A;   CARDIAC DEFIBRILLATOR  PLACEMENT     medtronic virtuoso   CHOLECYSTECTOMY     COLONOSCOPY  12/11/2012   Procedure: COLONOSCOPY;  Surgeon: Louis Meckel, MD;  Location: [facility] ENDOSCOPY;  Service: Endoscopy;  Laterality: N/A;   KNEE ARTHROPLASTY     Patient Active Problem List   Diagnosis Date Noted   Permanent atrial fibrillation (HCC) 05/10/2023   IVCD (intraventricular conduction defect) 05/10/2023   COPD (chronic obstructive pulmonary disease) (HCC) 12/26/2018   Osteoarthritis of right knee 09/22/2018   History of subdural hematoma 11/21/2017   Frontal headache 10/06/2017   Rhinitis 08/22/2017   Dyspnea on exertion 08/22/2017   Ischemic cardiomyopathy 03/28/2017   Chronic atrial fibrillation (HCC)    OSA (obstructive sleep apnea) 02/19/2016   Chronic systolic heart failure (HCC) 08/06/2015   LBBB (left bundle branch block) 08/06/2015   Carotid artery stenosis 03/17/2015   Former smoker 11/15/2014   Actinic keratosis 11/15/2014   History of cardioembolic cerebrovascular accident (CVA) 12/19/2012   Benign neoplasm of colon 12/11/2012   Implantable cardioverter-defibrillator (ICD) in situ 02/17/2012    ventricular tachycardia-non sustained  02/17/2012   History of skin cancer 07/05/2007   Diabetes mellitus type II, controlled (HCC) 10/16/2006   Hyperlipidemia associated with type 2 diabetes mellitus (HCC) 10/16/2006  Essential hypertension 10/16/2006   CAD (coronary artery disease) 10/16/2006   Chronic pulmonary embolism (HCC) 10/16/2006    PCP: Shelva Majestic, MD   REFERRING PROVIDER: Dolores Patty, MD   REFERRING DIAG:  I50.22 (ICD-10-CM) - Chronic systolic heart failure (HCC)  E45.409 (ICD-10-CM) - Weakness of both lower extremities    THERAPY DIAG:  No diagnosis found.  Rationale for Evaluation and Treatment: Rehabilitation  ONSET DATE: chronic  SUBJECTIVE:   SUBJECTIVE STATEMENT: I think the hip is getting worse. I have to wait another week or so to call the doctor. The  injection did not help at all. Not sure if it can be replaced because I have a bad heart.   EVAL: Pt reports over several years his legs have become weaker, his balance has decreased, and his walking tolerance decreased. Pt denies falling. Pt will use a rollator in the community when he knows he will need stand or walk for prolong time frames. Pt reports a Hx of L hip pain which can interfere with his mobility at times.  PERTINENT HISTORY: CAD, chronic Afib, DM, Hx back surgery, Hx CVA, defibrillator  PAIN:  Are you having pain? Yes: NPRS scale: current 2/10.  Can be 9/10 at times Pain location: L hip Pain description: sharp, ache Aggravating factors: Sit to stand, prolonged sitting, standing, and walking Relieving factors: Rest, sleeps in a recliner  PRECAUTIONS: Other: Cardiopulmonary , defibrillator  RED FLAGS: None   WEIGHT BEARING RESTRICTIONS: No  FALLS:  Has patient fallen in last 6 months? No  LIVING ENVIRONMENT: Lives with: lives with their family Lives in: House/apartment No issue with accessing home or mobility within  OCCUPATION: Retired  PLOF: Independent  PATIENT GOALS: Improved leg strength, balance, and activity tolerance   OBJECTIVE:  Note: Objective measures were completed at Evaluation unless otherwise noted.  DIAGNOSTIC FINDINGS: NA  PATIENT SURVEYS:  FOTO: Perceived function   41%, predicted   50%  FOTO 47% 10/31/23 FOTO 50% 11/14/23  COGNITION: Overall cognitive status: Within functional limits for tasks assessed     SENSATION: WFL  EDEMA:  NT  MUSCLE LENGTH: Hamstrings: Right NT deg; Left NT deg Maisie Fus test: Right NT deg; Left NT deg  POSTURE: rounded shoulders, forward head, increased lumbar lordosis, increased thoracic kyphosis, posterior pelvic tilt, and flexed trunk   PALPATION: NT  LOWER EXTREMITY MMT:  Active MMT Right eval Left eval Rt./Lt. 11/07/23  Hip flexion 4 4 4+/5  Hip extension 3 3 3+/5  Hip abduction 3+ 3+  3+/5  Hip adduction     Hip internal rotation     Hip external rotation 4 4   Knee flexion 5 5 5/5  Knee extension 5 5 5/5  Ankle dorsiflexion 4 3 5/5  Ankle plantarflexion 4 3   Ankle inversion     Ankle eversion      (Blank rows = not tested)  LOWER EXTREMITY ROM:  ROM Right eval Left eval  Hip flexion    Hip extension Decreased Decreased  Hip abduction Decreased Decreased  Hip adduction    Hip internal rotation Decreased Decreased  Hip external rotation Decreased Decreased  Knee flexion    Knee extension    Ankle dorsiflexion Decreased Decreased  Ankle plantarflexion    Ankle inversion    Ankle eversion     (Blank rows = not tested)  LOWER EXTREMITY SPECIAL TESTS:  Hip special tests: Luisa Hart (FABER) test: negative  FUNCTIONAL TESTS:  5 times sit to stand: 20.7" c  use of hands  11/07/23: 13.62 sec used hands  11/14/23: 20.8 sec used hands  2 minute walk test: TBA: 10/13/23: 282 feet with SPC; 11/10/23: 281 feet with SPC Single leg stand: L=1", R=5": 10/31/23: 5 sec left, 10 sec right Berg Balance: 48/56 10/13/23 BERG Balance 52/56 11/14/23  GAIT: Distance walked: 150' Assistive device utilized: None Level of assistance: Complete Independence Comments: Decreased step length, decreased ankle DF,    TODAY'S TREATMENT:  [facility] Adult PT Treatment:                                                DATE: 11/14/23 Therapeutic Exercise: Nustep x 10 minutes Review of HEP   Therapeutic Activity: 5 x STS BERG BALANCE TEST Sitting to Standing: 4 Standing Unsupported: 4.       Sitting Unsupported: 4.     Standing to Sitting: 4.      Transfers: 4.      Standing with eyes closed: 4.     Standing with feet together: 4.      Reaching forward with outstretched arm: 4.     Retrieving object from the floor: 4.       Turning to look behind: 4.     Turning 360 degrees: 2.    Place alternate foot on stool: 4.      Standing with one foot in front: 3.     Standing on one foot: 3.   (7 sec)  Total Score: 52/56    [facility] Adult PT Treatment:                                                DATE: 11/10/23 Therapeutic Exercise: Nustep L5 x 8 min Supine clam AROM LTR 10 x 2  Bridge 10 x 2  Left heel slides in slide board Left Quad set  Left SAQ Left mod thomas stretch using step  Side left clam 10 x 2  Side left hip abduction x 8 Single leg bridge x 10 each  Therapeutic Activity: 280 feet (unchanged)     [facility] Adult PT Treatment:                                                DATE: 11/07/23 Therapeutic Exercise: Nustep L6 x 6 min  Sit to stand x 5 for time  Sit to stand no UEs x 10 x  2 with table elevated Standing hip extension no band then with band x 15 each  Standing hip abduction cues needed x 15  Hip flexor stretch standing, limited  Supine hamstring stretch with A using strap Lower trunk rotation  x 10 for L hip  Bridge x 10   Manual Therapy: Soft tissue to to L rectus femoris on stretch (thomas test position)   Therapeutic Activity: Check MMT 2 min walk test (next visit)  Self Care: Treatment to L hip as part of PT for pain relief               PATIENT EDUCATION:  Education details: Eval findings, POC, HEP Person educated: Patient Education  method: Explanation, Demonstration, Tactile cues, Verbal cues, and Handouts Education comprehension: verbalized understanding, returned demonstration, verbal cues required, and tactile cues required  HOME EXERCISE PROGRAM: Access Code: [email or web address] URL: [email or web address] Date: 10/20/2023 Prepared by: Joellyn Rued  Exercises - Standing Single Leg Stance with Counter Support  - 2-3 x daily - 7 x weekly - 1 sets - 5 reps - 20 hold - Standing Tandem Balance with Counter Support  - 1 x daily - 7 x weekly - 1 sets - 3 reps - 30 hold - Side Stepping with Counter Support  - 1 x daily - 7 x weekly - 1 sets - 3 reps - Mini Squat with Counter Support  - 1 x daily - 7 x weekly - 1-2 sets -  10 reps - 3 hold - Supine Bridge  - 1 x daily - 7 x weekly - 1-2 sets - 10 reps - 5 hold - Hooklying Isometric Clamshell  - 1 x daily - 7 x weekly - 2 sets - 10 reps - 3 hold  ASSESSMENT:  CLINICAL IMPRESSION: BERG improved from 48/56 to 52/56. FOTO score target MET. No change in 5 x STS time or 2 minute walk test time. Pt continues to be limited by left hip pain during activity. Although patient has made functional improvements and Met several LTGs, he feels that his hip pain has gotten worse and he is requesting discharge to HEP today. He will continue his HEP and follow up with MD next month for other treatment options.     EVAL: Patient is a 82 y.o. male who was seen today for physical therapy evaluation and treatment for  I50.22 (ICD-10-CM) - Chronic systolic heart failure (HCC)  Q65.784 (ICD-10-CM) - Weakness of both lower extremities  .Pt presents to PT with decreased LE strength, decreased balance, decreased activity tolerance, postural changes impacting COG and balance, L hip pain, and decreased tolerance to activity. Pt will benefit from skilled PT to address impairments to optimize functional mobility.   OBJECTIVE IMPAIRMENTS: decreased activity tolerance, decreased balance, decreased endurance, difficulty walking, decreased ROM, decreased strength, postural dysfunction, and pain.   ACTIVITY LIMITATIONS: carrying, lifting, bending, standing, squatting, sleeping, stairs, locomotion level, and caring for others  PARTICIPATION LIMITATIONS: meal prep, cleaning, laundry, driving, shopping, community activity, and yard work  PERSONAL FACTORS: Age, Fitness, Past/current experiences, Time since onset of injury/illness/exacerbation, and 1-2 comorbidities: cadiopulmonary issues, DM, Hx of back surgery  are also affecting patient's functional outcome.   REHAB POTENTIAL: Good  CLINICAL DECISION MAKING: Evolving/moderate complexity  EVALUATION COMPLEXITY: Moderate   GOALS:  SHORT TERM  GOALS: Target date: 10/21/23 Pt will be Ind in an initial HEP  Baseline:started Goal status: MET  LONG TERM GOALS: Target date: 11/30/23  Pt will be Ind in a final HEP to maintain achieved LOF  Baseline:  Goal status: MET  2.  Increase bilat hip strength and ankle strength by muscle grade for improved function and balance Baseline: see flow sheet 11/14/23: see flow sheet Goal status: NOT MET  3.  Improve 5xSTS by MCID of 5" and by MCID of 55ft as indication of improved functional mobility  Baseline: 5xSTS 21.7 sec 10/13/23: 282 feet  11/13/23: 281 feet / 20.8 sec Goal status: NOT MET  4.  Improve Berg balance by 5 pts as indication of improved balance Baseline: TBA 10/13/23: 48/56 11/14/23: 52/56 Goal status: PARTIALLY MET  5.  Increase L LE single leg stand to 5" for improved balance Baseline: L 1", R  5" 10/31/23: 5 sec  Goal status: MET  6.  Pt's FOTO score will improved to the predicted value of 50% as indication of improved function  Baseline: 41% 10/31/23: 47% 11/14/23: 50%  Goal status:MET  PLAN:  PT FREQUENCY: 2x/week  PT DURATION: 8 weeks  PLANNED INTERVENTIONS: [id number]- PT Re-evaluation, 97110-Therapeutic exercises, 97530- Therapeutic activity, 97112- Neuromuscular re-education, 97535- Self Care, 40981- Manual therapy, [phone]- Gait training, Patient/Family education, Balance training, Stair training, Joint manipulation, Cryotherapy, and Moist heat  PLAN FOR NEXT SESSION:  N/A; DC to HEP  Jannette Spanner, PTA 11/14/23 12:12 PM Phone: [phone] Fax: [phone]   PHYSICAL THERAPY DISCHARGE SUMMARY  Visits from Start of Care: 10  Current functional level related to goals / functional outcomes: See clinical impression and PT goals     Remaining deficits: See clinical impression and PT goals    Education / Equipment: HEP. Pt Ed   Patient agrees to discharge. Patient goals were partially met. Patient is being discharged due to lack of progress.    Allen Ralls MS, PT 11/23/23 4:23 PM

## 2023-11-17 ENCOUNTER — Ambulatory Visit: Payer: Medicare HMO

## 2023-11-21 ENCOUNTER — Ambulatory Visit: Payer: Medicare HMO | Admitting: Physical Therapy

## 2023-11-22 NOTE — Progress Notes (Signed)
Remote ICD transmission.   

## 2023-11-28 ENCOUNTER — Encounter: Payer: Medicare HMO | Admitting: Physical Therapy

## 2023-12-01 ENCOUNTER — Ambulatory Visit: Payer: Medicare HMO | Attending: Internal Medicine

## 2023-12-01 DIAGNOSIS — I255 Ischemic cardiomyopathy: Secondary | ICD-10-CM

## 2023-12-02 NOTE — Progress Notes (Signed)
Device tone disabled. Sent to scheduling and [place] Mole for gen change.

## 2023-12-08 ENCOUNTER — Encounter: Payer: Self-pay | Admitting: Family Medicine

## 2023-12-08 ENCOUNTER — Ambulatory Visit (INDEPENDENT_AMBULATORY_CARE_PROVIDER_SITE_OTHER): Payer: Medicare HMO | Admitting: Family Medicine

## 2023-12-08 VITALS — BP 102/64 | HR 96 | Temp 97.1°F | Wt 202.2 lb

## 2023-12-08 DIAGNOSIS — E119 Type 2 diabetes mellitus without complications: Secondary | ICD-10-CM

## 2023-12-08 DIAGNOSIS — I2510 Atherosclerotic heart disease of native coronary artery without angina pectoris: Secondary | ICD-10-CM | POA: Diagnosis not present

## 2023-12-08 DIAGNOSIS — E785 Hyperlipidemia, unspecified: Secondary | ICD-10-CM

## 2023-12-08 DIAGNOSIS — I5022 Chronic systolic (congestive) heart failure: Secondary | ICD-10-CM

## 2023-12-08 DIAGNOSIS — I4821 Permanent atrial fibrillation: Secondary | ICD-10-CM | POA: Diagnosis not present

## 2023-12-08 DIAGNOSIS — E1169 Type 2 diabetes mellitus with other specified complication: Secondary | ICD-10-CM

## 2023-12-08 DIAGNOSIS — J449 Chronic obstructive pulmonary disease, unspecified: Secondary | ICD-10-CM

## 2023-12-08 DIAGNOSIS — I10 Essential (primary) hypertension: Secondary | ICD-10-CM

## 2023-12-08 DIAGNOSIS — Z7984 Long term (current) use of oral hypoglycemic drugs: Secondary | ICD-10-CM | POA: Diagnosis not present

## 2023-12-08 NOTE — Progress Notes (Signed)
 Phone [phone] In person visit   Subjective:   Carl Wood is a 83 y.o. year old very pleasant adult patient who presents for/with See problem oriented charting Chief Complaint  Patient presents with   Medical Management of Chronic Issues   Hypertension   Diabetes   Past Medical History-  Patient Active Problem List   Diagnosis Date Noted   COPD (chronic obstructive pulmonary disease) (HCC) 12/26/2018    Priority: High   History of subdural hematoma 11/21/2017    Priority: High   Chronic atrial fibrillation (HCC)     Priority: High   Chronic systolic heart failure (HCC) 08/06/2015    Priority: High   Former smoker 11/15/2014    Priority: High   History of cardioembolic cerebrovascular accident (CVA) 12/19/2012    Priority: High    ventricular tachycardia-non sustained  02/17/2012    Priority: High   Diabetes mellitus type II, controlled (HCC) 10/16/2006    Priority: High   CAD (coronary artery disease) 10/16/2006    Priority: High   Chronic pulmonary embolism (HCC) 10/16/2006    Priority: High   Dyspnea on exertion 08/22/2017    Priority: Medium    OSA (obstructive sleep apnea) 02/19/2016    Priority: Medium    Carotid artery stenosis 03/17/2015    Priority: Medium    Implantable cardioverter-defibrillator (ICD) in situ 02/17/2012    Priority: Medium    History of skin cancer 07/05/2007    Priority: Medium    Hyperlipidemia associated with type 2 diabetes mellitus (HCC) 10/16/2006    Priority: Medium    Essential hypertension 10/16/2006    Priority: Medium    Osteoarthritis of right knee 09/22/2018    Priority: Low   Frontal headache 10/06/2017    Priority: Low   Rhinitis 08/22/2017    Priority: Low   LBBB (left bundle branch block) 08/06/2015    Priority: Low   Actinic keratosis 11/15/2014    Priority: Low   Benign neoplasm of colon 12/11/2012    Priority: Low   Permanent atrial fibrillation (HCC) 05/10/2023   IVCD (intraventricular  conduction defect) 05/10/2023   Ischemic cardiomyopathy 03/28/2017    Medications- reviewed and updated Current Outpatient Medications  Medication Sig Dispense Refill   Ascorbic Acid (VITAMIN C ER PO) Take 1 tablet by mouth daily.     blood glucose meter kit and supplies KIT Dispense based on patient and insurance preference. Use up to four times daily as directed. 1 each 0   cetirizine (ZYRTEC) 10 MG tablet Take 10 mg by mouth daily.     digoxin  (LANOXIN ) 0.25 MG tablet Take 0.5 tablets (0.125 mg total) by mouth daily. 45 tablet 3   empagliflozin  (JARDIANCE ) 10 MG TABS tablet Take 1 tablet (10 mg total) by mouth daily. 90 tablet 3   furosemide  (LASIX ) 20 MG tablet Take 1 tablet by mouth once a week 12 tablet 0   glimepiride  (AMARYL ) 4 MG tablet TAKE 2 TABLETS BY MOUTH ONCE DAILY WITH BREAKFAST 180 tablet 0   glucose blood (ACCU-CHEK GUIDE) test strip USE UP TO FOUR TIMES DAILY Dx: E11.9 450 each 3   metFORMIN  (GLUCOPHAGE -XR) 500 MG 24 hr tablet TAKE 2 TABLETS BY MOUTH IN THE MORNING AND 2 AT BEDTIME 360 tablet 0   metoprolol  succinate (TOPROL  XL) 25 MG 24 hr tablet Take 1 tablet (25 mg total) by mouth at bedtime. 90 tablet 2   rivaroxaban  (XARELTO ) 20 MG TABS tablet Take 1 tablet by mouth once daily 90  tablet 2   rosuvastatin  (CRESTOR ) 40 MG tablet Take 1 tablet by mouth once daily 90 tablet 0   sacubitril -valsartan  (ENTRESTO ) 24-26 MG Take 1 tablet by mouth 2 (two) times daily. 180 tablet 3   Turmeric 500 MG CAPS Take by mouth. 1 daily     No current facility-administered medications for this visit.     Objective:  BP 102/64   Pulse 96   Temp (!) 97.1 F (36.2 C)   Wt 202 lb 3.2 oz (91.7 kg)   SpO2 100%   BMI 27.42 kg/m  Gen: NAD, resting comfortably CV: RRR no murmurs rubs or gallops Lungs: CTAB no crackles, wheeze, rhonchi Ext: no edema on right, trace on left Skin: warm, dry  Diabetic Foot Exam - Simple   Simple Foot Form Diabetic Foot exam was performed with the  following findings: Yes 12/08/2023  3:21 PM  Visual Inspection No deformities, no ulcerations, no other skin breakdown bilaterally: Yes Sensation Testing Intact to touch and monofilament testing bilaterally: Yes Pulse Check Posterior Tibialis and Dorsalis pulse intact bilaterally: Yes Comments       Assessment and Plan   #Hip pain- wants to go back to see Dr. Leonce after visit with cardiology and get defbrillator replaced. Injection didn't help the left hip. Left knee injection did help knee but bothering him again  #Chronic systolic heart failure-follows with Dr. Cherrie #ICD in place with history of ventricular tachycardia S: Medication: Metoprolol  25 mg extended release, digoxin  0.125mg , Lasix  20 mg-once a week, Entresto  24-26 mg BID.  Also on Jardiance  10 mg for diabetes/heart failure -ICD monitored by cardiology- has upcoming change planned - weights up 12 lbs from last visit- but on home scales is pretty stable A/P: weight  up here but not at home- we opted to monitor without increased swelling. CHF appears table- continue current medications   # Atrial fibrillation chronic/ history of cardioemolic CVA/history subdural hematoma S: Compliant with metoprolol  25 mg ER for rate control and Xarelto  20 mg for anticoagulation. -Does have history of subdural hematoma-Xarelto  was held short-term but was restarted once resolved -History of cardioembolic strokes we prefer to have him on Xarelto  as long as no falls A/P: appropriately anticoagulated and rate controlled- continue current medicine    #CAD/hyperlipidemia S: History of MI in 1983.  Denies history of stents or bypass-streptokinase was used in the past per patient. -Due to bleeding risk is on Xarelto  alone and not on aspirin  -Patient compliant with rosuvastatin  40 mg daily.  LDL goal under 70 - consider zetia  -no chest pain, sparing shortness of breath  Lab Results  Component Value Date   CHOL 138 06/07/2023   HDL 46.10  06/07/2023   LDLCALC 78 06/07/2023   LDLDIRECT 77 11/06/2020   TRIG 74.0 06/07/2023   CHOLHDL 3 06/07/2023  A/P: coronary artery disease largely asymptomatic continue current medications   -cholesterol hair high last visit- update direct LDL today  #COPD S: no rx -symbicort  did not make a big difference in breathing A/P: asymptomatic - continue to monitor without medication(s)    # Diabetes S: Compliant with metformin  1000mg  twice daily ER , jardiance  10mg  (q2 hour urination so wants to avoid increase ), amaryl  8 mg Lab Results  Component Value Date   HGBA1C 7.1 (H) 06/07/2023   HGBA1C 6.6 (H) 02/01/2023   HGBA1C 6.3 10/01/2022  A/P: hopefully stable but some concern with weight gain- update a1c today. Continue current meds for now   #Hypertension S: Compliant with  metoprolol  25 mg extended release, Entresto  24-26 mg daily, Lasix  20 mg weekly .  Blood pressure traditionally runs very low but these medications have been required due to CHF  BP Readings from Last 3 Encounters:  12/08/23 102/64  10/12/23 (!) 104/58  07/25/23 94/60  A/P: stable- continue current medicines    #OSA-compliant with CPAP  Recommended follow up: Return in about 6 months (around 06/06/2024) for physical or sooner if needed.Schedule b4 you leave. Future Appointments  Date Time Provider Department Center  12/19/2023  2:30 PM Aniceto Daphne CROME, NP [facility]  12/28/2023  7:25 AM [facility] DEVICE REMOTES [facility]  01/10/2024  2:20 PM Bensimhon, Toribio SAUNDERS, MD [facility] None  01/30/2024  7:00 AM [facility] DEVICE REMOTES [facility]   Lab/Order associations:   ICD-10-CM   1. Coronary artery disease involving native coronary artery of native heart without angina pectoris  I25.10     2. Controlled type 2 diabetes mellitus without complication, without long-term current use of insulin  (HCC)  E11.9 Hemoglobin A1c    Comprehensive metabolic panel    CBC with  Differential/Platelet    3. Essential hypertension  I10     4. Hyperlipidemia associated with type 2 diabetes mellitus (HCC)  E11.69 Direct LDL   E78.5     5. Chronic systolic heart failure (HCC) Chronic I50.22     6. Chronic obstructive pulmonary disease, unspecified COPD type (HCC) Chronic J44.9     7. Permanent atrial fibrillation (HCC) Chronic I48.21     8. Long term current use of oral hypoglycemic drug  Z79.84       No orders of the defined types were placed in this encounter.   Return precautions advised.  Garnette Lukes, MD

## 2023-12-08 NOTE — Patient Instructions (Addendum)
 Get diabetic eye exam scheduled for 2025- but team please request his from 2024 as well  You are eligible to schedule your annual wellness visit with our nurse specialist Ell[place]ise.  Please consider scheduling this before you leave today  Please stop by lab before you go If you have mychart- we will send your results within 3 business days of us  receiving them.  If you do not have mychart- we will call you about results within 5 business days of us  receiving them.  *please also note that you will see labs on mychart as soon as they post. I will later go in and write notes on them- will say notes from Dr. Katrinka   Recommended follow up: Return in about 6 months (around 06/06/2024) for physical or sooner if needed.Schedule b4 you leave.

## 2023-12-09 LAB — COMPREHENSIVE METABOLIC PANEL
ALT: 17 U/L (ref 0–53)
AST: 20 U/L (ref 0–37)
Albumin: 4.4 g/dL (ref 3.5–5.2)
Alkaline Phosphatase: 58 U/L (ref 39–117)
BUN: 25 mg/dL — ABNORMAL HIGH (ref 6–23)
CO2: 28 meq/L (ref 19–32)
Calcium: 9.9 mg/dL (ref 8.4–10.5)
Chloride: 102 meq/L (ref 96–112)
Creatinine, Ser: 0.99 mg/dL (ref 0.40–1.50)
GFR: 72.07 mL/min (ref >60.00)
Glucose, Bld: 141 mg/dL — ABNORMAL HIGH (ref 70–99)
Potassium: 4.4 meq/L (ref 3.5–5.1)
Sodium: 140 meq/L (ref 135–145)
Total Bilirubin: 0.8 mg/dL (ref 0.2–1.2)
Total Protein: 7.2 g/dL (ref 6.0–8.3)

## 2023-12-09 LAB — CBC WITH DIFFERENTIAL/PLATELET
Basophils Absolute: 0 10*3/uL (ref 0.0–0.1)
Basophils Relative: 0.6 % (ref 0.0–3.0)
Eosinophils Absolute: 0.1 10*3/uL (ref 0.0–0.7)
Eosinophils Relative: 1.5 % (ref 0.0–5.0)
HCT: 48 % (ref 39.0–52.0)
Hemoglobin: 16.1 g/dL (ref 13.0–17.0)
Lymphocytes Relative: 13.9 % (ref 12.0–46.0)
Lymphs Abs: 0.9 10*3/uL (ref 0.7–4.0)
MCHC: 33.5 g/dL (ref 30.0–36.0)
MCV: 100 fL (ref 78.0–100.0)
Monocytes Absolute: 0.6 10*3/uL (ref 0.1–1.0)
Monocytes Relative: 9.4 % (ref 3.0–12.0)
Neutro Abs: 5.1 10*3/uL (ref 1.4–7.7)
Neutrophils Relative %: 74.6 % (ref 43.0–77.0)
Platelets: 168 10*3/uL (ref 150.0–400.0)
RBC: 4.8 Mil/uL (ref 4.22–5.81)
RDW: 14.5 % (ref 11.5–15.5)
WBC: 6.8 10*3/uL (ref 4.0–10.5)

## 2023-12-09 LAB — LDL CHOLESTEROL, DIRECT: Direct LDL: 81 mg/dL

## 2023-12-09 LAB — HEMOGLOBIN A1C: Hgb A1c MFr Bld: 7.9 % — ABNORMAL HIGH (ref 4.6–6.5)

## 2023-12-12 ENCOUNTER — Encounter: Payer: Self-pay | Admitting: Family Medicine

## 2023-12-12 ENCOUNTER — Other Ambulatory Visit: Payer: Self-pay

## 2023-12-12 MED ORDER — EZETIMIBE 10 MG PO TABS: 10.0000 mg | ORAL_TABLET | Freq: Every day | ORAL | 3 refills | Status: DC

## 2023-12-13 NOTE — Telephone Encounter (Signed)
 Zetia has been sent to the pharmacy.

## 2023-12-16 ENCOUNTER — Other Ambulatory Visit: Payer: Self-pay | Admitting: Family Medicine

## 2023-12-17 NOTE — Progress Notes (Signed)
 Electrophysiology Office Note:   Date:  12/19/2023  ID:  Carl Wood, DOB [birth year], MRN [id number]  Primary Cardiologist: None Primary Heart Failure: [name], MD Electrophysiologist: [name], MD      History of Present Illness:   Carl Wood is a 83 y.o. adult with h/o HFrEF, ICM, VT s/p ICD, LBBB, HTN, HLD, CAD s/p CABG, permanent atrial fibrillation, PE, OSA, former smoker, DM II seen today for routine electrophysiology followup.   The patients device met RRT as of 11/26/23.  His device tone was disabled on 12/01/23. He had lab work completed on 12/07/22 with normal Cr, Hgb.    Since last being seen in our clinic the patient reports he has been doing well. He notes he is concerned about being off his anticoagulation for the procedure given his stroke risk.    He denies chest pain, palpitations, dyspnea, PND, orthopnea, nausea, vomiting, dizziness, syncope, edema, weight gain, or early satiety.   Review of systems complete and found to be negative unless listed in HPI.   EP Information / Studies Reviewed:    EKG is ordered today. Personal review as below.  EKG Interpretation Date/Time:  Monday December 19 2023 14:44:28 EST Ventricular Rate:  90 PR Interval:    QRS Duration:  160 QT Interval:  396 QTC Calculation: 484 R Axis:   263  Text Interpretation: Ventricular-paced rhythm Confirmed by Canary Brim ([id number]) on 12/19/2023 2:57:10 PM   ICD Interrogation-  reviewed in detail today,  See PACEART report.  Device History: Medtronic BiV ICD - initial implanted 1997 for ICM; gen change 2001; gen change 2009, gen change 03/28/17 for VT (upgrade to BiV ICD) History of appropriate therapy: Yes History of AAD therapy:  Tikosyn in 2003- stopped ~2012    Studies:  20/1027 LHC > LAD disease, non-viable anterior wall ECHO 07/2023 > LVEF 25-30%    Arrhythmia / AAD VT s/p CRT-D Permanent AF     Risk Assessment/Calculations:    CHA2DS2-VASc Score = 8    This indicates a 10.8% annual risk of stroke. The patient's score is based upon: CHF History: 1 HTN History: 1 Diabetes History: 1 Stroke History: 2 Vascular Disease History: 1 Age Score: 2 Gender Score: 0             Physical Exam:   VS:  BP 120/62   Pulse 90   Ht 6' (1.829 m)   Wt 195 lb (88.5 kg)   SpO2 94%   BMI 26.45 kg/m    Wt Readings from Last 3 Encounters:  12/19/23 195 lb (88.5 kg)  12/08/23 202 lb 3.2 oz (91.7 kg)  10/18/23 195 lb (88.5 kg)     GEN: Well nourished, well developed in no acute distress NECK: No JVD; No carotid bruits CARDIAC: Regular rate and rhythm (VP), no murmurs, rubs, gallops RESPIRATORY:  Clear to auscultation without rales, wheezing or rhonchi  ABDOMEN: Soft, non-tender, non-distended EXTREMITIES:  No edema; No deformity   ASSESSMENT AND PLAN:    Chronic Systolic Dysfunction s/p Medtronic CRT-D  ICM, CAD s/p CABG -euvolemic today -Stable on an appropriate medical regimen -Normal ICD function -See Pace Art report -No changes today -prior visit with soft BP's on Entresto > pt did not want to stop as he felt good  -pt set up for generator change on 01/04/24 > OAC reviewed with [name] in clinic and given stroke risk, ok for patient to hold for one day before procedure and will consider early restart post  procedure with pressure dressing to mitigate CVA risk  Permanent Atrial Fibrillation  CHA2DS2-VASc 8 / 10.8% annual risk of CVA -OAC for stroke prophylaxis, see notes above for pre-procedure  Secondary Hypercoagulable State  -continue Xarelto, dose reviewed and appropriate by Cr Cl of 56mL/min from 12/08/23 labs   Disposition:   Follow up with [name] as planned for generator change 01/04/24    Signed, Canary Brim, MSN, APRN, NP-C, AGACNP-BC [facility] - Electrophysiology  12/19/2023, 4:26 PM

## 2023-12-17 NOTE — H&P (View-Only) (Signed)
Electrophysiology Office Note:   Date:  12/19/2023  ID:  Carl Wood, DOB [birth year], MRN [id number]  Primary Cardiologist: None Primary Heart Failure: [name], MD Electrophysiologist: [name], MD      History of Present Illness:   Carl Wood is a 83 y.o. adult with h/o HFrEF, ICM, VT s/p ICD, LBBB, HTN, HLD, CAD s/p CABG, permanent atrial fibrillation, PE, OSA, former smoker, DM II seen today for routine electrophysiology followup.   The patients device met RRT as of 11/26/23.  His device tone was disabled on 12/01/23. He had lab work completed on 12/07/22 with normal Cr, Hgb.    Since last being seen in our clinic the patient reports he has been doing well. He notes he is concerned about being off his anticoagulation for the procedure given his stroke risk.    He denies chest pain, palpitations, dyspnea, PND, orthopnea, nausea, vomiting, dizziness, syncope, edema, weight gain, or early satiety.   Review of systems complete and found to be negative unless listed in HPI.   EP Information / Studies Reviewed:    EKG is ordered today. Personal review as below.  EKG Interpretation Date/Time:  Monday December 19 2023 14:44:28 EST Ventricular Rate:  90 PR Interval:    QRS Duration:  160 QT Interval:  396 QTC Calculation: 484 R Axis:   263  Text Interpretation: Ventricular-paced rhythm Confirmed by Canary Brim ([id number]) on 12/19/2023 2:57:10 PM   ICD Interrogation-  reviewed in detail today,  See PACEART report.  Device History: Medtronic BiV ICD - initial implanted 1997 for ICM; gen change 2001; gen change 2009, gen change 03/28/17 for VT (upgrade to BiV ICD) History of appropriate therapy: Yes History of AAD therapy:  Tikosyn in 2003- stopped ~2012    Studies:  20/1027 LHC > LAD disease, non-viable anterior wall ECHO 07/2023 > LVEF 25-30%    Arrhythmia / AAD VT s/p CRT-D Permanent AF     Risk Assessment/Calculations:    CHA2DS2-VASc Score = 8    This indicates a 10.8% annual risk of stroke. The patient's score is based upon: CHF History: 1 HTN History: 1 Diabetes History: 1 Stroke History: 2 Vascular Disease History: 1 Age Score: 2 Gender Score: 0             Physical Exam:   VS:  BP 120/62   Pulse 90   Ht 6' (1.829 m)   Wt 195 lb (88.5 kg)   SpO2 94%   BMI 26.45 kg/m    Wt Readings from Last 3 Encounters:  12/19/23 195 lb (88.5 kg)  12/08/23 202 lb 3.2 oz (91.7 kg)  10/18/23 195 lb (88.5 kg)     GEN: Well nourished, well developed in no acute distress NECK: No JVD; No carotid bruits CARDIAC: Regular rate and rhythm (VP), no murmurs, rubs, gallops RESPIRATORY:  Clear to auscultation without rales, wheezing or rhonchi  ABDOMEN: Soft, non-tender, non-distended EXTREMITIES:  No edema; No deformity   ASSESSMENT AND PLAN:    Chronic Systolic Dysfunction s/p Medtronic CRT-D  ICM, CAD s/p CABG -euvolemic today -Stable on an appropriate medical regimen -Normal ICD function -See Pace Art report -No changes today -prior visit with soft BP's on Entresto > pt did not want to stop as he felt good  -pt set up for generator change on 01/04/24 > OAC reviewed with [name] in clinic and given stroke risk, ok for patient to hold for one day before procedure and will consider early restart post  procedure with pressure dressing to mitigate CVA risk  Permanent Atrial Fibrillation  CHA2DS2-VASc 8 / 10.8% annual risk of CVA -OAC for stroke prophylaxis, see notes above for pre-procedure  Secondary Hypercoagulable State  -continue Xarelto, dose reviewed and appropriate by Cr Cl of 56mL/min from 12/08/23 labs   Disposition:   Follow up with [name] as planned for generator change 01/04/24    Signed, Canary Brim, MSN, APRN, NP-C, AGACNP-BC [facility] - Electrophysiology  12/19/2023, 4:26 PM

## 2023-12-19 ENCOUNTER — Telehealth: Payer: Self-pay | Admitting: Pulmonary Disease

## 2023-12-19 ENCOUNTER — Encounter: Payer: Self-pay | Admitting: *Deleted

## 2023-12-19 ENCOUNTER — Ambulatory Visit: Payer: Medicare HMO | Attending: Pulmonary Disease | Admitting: Pulmonary Disease

## 2023-12-19 ENCOUNTER — Encounter: Payer: Self-pay | Admitting: Pulmonary Disease

## 2023-12-19 VITALS — BP 120/62 | HR 90 | Ht 72.0 in | Wt 195.0 lb

## 2023-12-19 DIAGNOSIS — I482 Chronic atrial fibrillation, unspecified: Secondary | ICD-10-CM

## 2023-12-19 DIAGNOSIS — I472 Ventricular tachycardia, unspecified: Secondary | ICD-10-CM

## 2023-12-19 DIAGNOSIS — I2510 Atherosclerotic heart disease of native coronary artery without angina pectoris: Secondary | ICD-10-CM | POA: Diagnosis not present

## 2023-12-19 DIAGNOSIS — I255 Ischemic cardiomyopathy: Secondary | ICD-10-CM | POA: Diagnosis not present

## 2023-12-19 DIAGNOSIS — Z95810 Presence of automatic (implantable) cardiac defibrillator: Secondary | ICD-10-CM | POA: Diagnosis not present

## 2023-12-19 LAB — CUP PACEART INCLINIC DEVICE CHECK
Battery Remaining Longevity: 1 mo — CL
Battery Voltage: 2.67 V
Brady Statistic AP VP Percent: 0 %
Brady Statistic AP VS Percent: 0 %
Brady Statistic AS VP Percent: 0 %
Brady Statistic AS VS Percent: 0 %
Brady Statistic RA Percent Paced: 0 %
Brady Statistic RV Percent Paced: 93.71 %
Date Time Interrogation Session: 20250113155734
HighPow Impedance: 79 Ohm
Implantable Lead Connection Status: 753985
Implantable Lead Connection Status: 753985
Implantable Lead Implant Date: 20010723
Implantable Lead Implant Date: 20180423
Implantable Lead Location: 753858
Implantable Lead Location: 753860
Implantable Lead Model: 6943
Implantable Pulse Generator Implant Date: 20180423
Lead Channel Impedance Value: 246.635
Lead Channel Impedance Value: 250.943
Lead Channel Impedance Value: 255.093
Lead Channel Impedance Value: 261.164
Lead Channel Impedance Value: 270.667
Lead Channel Impedance Value: 304 Ohm
Lead Channel Impedance Value: 4047 Ohm
Lead Channel Impedance Value: 456 Ohm
Lead Channel Impedance Value: 475 Ohm
Lead Channel Impedance Value: 513 Ohm
Lead Channel Impedance Value: 532 Ohm
Lead Channel Impedance Value: 551 Ohm
Lead Channel Impedance Value: 874 Ohm
Lead Channel Impedance Value: 893 Ohm
Lead Channel Impedance Value: 893 Ohm
Lead Channel Impedance Value: 931 Ohm
Lead Channel Impedance Value: 931 Ohm
Lead Channel Impedance Value: 950 Ohm
Lead Channel Pacing Threshold Amplitude: 1.125 V
Lead Channel Pacing Threshold Amplitude: 1.375 V
Lead Channel Pacing Threshold Pulse Width: 0.4 ms
Lead Channel Pacing Threshold Pulse Width: 1 ms
Lead Channel Sensing Intrinsic Amplitude: 12.875 mV
Lead Channel Sensing Intrinsic Amplitude: 13.125 mV
Lead Channel Setting Pacing Amplitude: 1.75 V
Lead Channel Setting Pacing Amplitude: 2.75 V
Lead Channel Setting Pacing Pulse Width: 0.4 ms
Lead Channel Setting Pacing Pulse Width: 1 ms
Lead Channel Setting Sensing Sensitivity: 0.3 mV
Zone Setting Status: 755011

## 2023-12-19 NOTE — Patient Instructions (Addendum)
 Medication Instructions:  Your physician recommends that you continue on your current medications as directed. Please refer to the Current Medication list given to you today.  *If you need a refill on your cardiac medications before your next appointment, please call your pharmacy*  Lab Work: None ordered If you have labs (blood work) drawn today and your tests are completely normal, you will receive your results only by: MyChart Message (if you have MyChart) OR A paper copy in the mail If you have any lab test that is abnormal or we need to change your treatment, we will call you to review the results.   Testing/Procedures: See letter   Follow-Up: At [facility], you and your health needs are our priority.  As part of our continuing mission to provide you with exceptional heart care, we have created designated Provider Care Teams.  These Care Teams include your primary Cardiologist (physician) and Advanced Practice Providers (APPs -  Physician Assistants and Nurse Practitioners) who all work together to provide you with the care you need, when you need it.   Your next appointment:   Your follow up appointments will be scheduled for you and print out on your discharge paperwork after your procedure.

## 2023-12-19 NOTE — Telephone Encounter (Signed)
 Discussed stroke risk and Xarelto  prior to procedure with Dr. Fernande.  Plan to hold Xarelto  for one day (the day before his procedure) and will plan for pressure dressing post procedure, early restart of Xarelto  given hx of prior stroke and patients annual stroke risk.     Can we please call the patient back and let him know of the change?  I called and left a message for him to call back.  Thank you!    Daphne Barrack, MSN, APRN, NP-C, AGACNP-BC [facility] - Electrophysiology  12/19/2023, 4:31 PM

## 2023-12-23 NOTE — Telephone Encounter (Signed)
Left another msg for pt to return call

## 2023-12-26 ENCOUNTER — Ambulatory Visit: Payer: Medicare HMO | Admitting: Family Medicine

## 2023-12-26 ENCOUNTER — Other Ambulatory Visit: Payer: Self-pay | Admitting: Family Medicine

## 2023-12-26 ENCOUNTER — Other Ambulatory Visit: Payer: Self-pay

## 2023-12-26 ENCOUNTER — Encounter: Payer: Self-pay | Admitting: Family Medicine

## 2023-12-26 VITALS — BP 114/72 | Ht 72.0 in | Wt 197.0 lb

## 2023-12-26 DIAGNOSIS — M25562 Pain in left knee: Secondary | ICD-10-CM | POA: Diagnosis not present

## 2023-12-26 DIAGNOSIS — G8929 Other chronic pain: Secondary | ICD-10-CM

## 2023-12-26 DIAGNOSIS — M25552 Pain in left hip: Secondary | ICD-10-CM | POA: Diagnosis not present

## 2023-12-26 NOTE — Patient Instructions (Addendum)
Thank you for coming in today.    

## 2023-12-26 NOTE — Progress Notes (Addendum)
I, Stevenson Clinch, CMA acting as a scribe for Carl Graham, MD.  Carl Wood is a 83 y.o. adult who presents to [facility] today for re-occurring L hip and L knee pain. Pt was last seen by Dr. Jean Rosenthal on 10/18/23 and was given a L interarticular hip injection and was advised to cont PT, completing  multiple visits, has been discharged.   Last visit for his L knee was on 01/11/23 w/ Dr. Jean Rosenthal. Last L knee steroid injection was on 12/21/22.  Today, pt reports progressive worsening of sx since November. Minimal pain at rest. Shooting pain with movement and ambulation.  Locates pain to anterior aspect of the hip. Denies radiating pain. Denies mechanical sx. Ambulating with a cane today. Taking Tylenol prn. Also c/o left knee pain x 2-3 months. Got about 4 months of relief with last steroid injection. Notes weakness in the knee, worse with twisting movements. Denies swelling, mechanical sx. Wearing compression brace today.   Dx imaging: 10/18/23 L hip XR  12/21/22 L knee XR  Pertinent review of systems: No fevers or chills  Relevant historical information: Implanted cardiac defibrillator.  He is due for a device change next week with [name]   Exam:  BP 114/72   Ht 6' (1.829 m)   Wt 197 lb (89.4 kg)   SpO2 97%   BMI 26.72 kg/m  General: Well Developed, well nourished, and in no acute distress.   MSK: L-spine nontender palpation midline decreased lumbar motion.  Acceleration with strength is intact.  Left hip normal-appearing decreased motion.  Pain flexion.    Lab and Radiology Results  EXAM: DG HIP (WITH OR WITHOUT PELVIS) 3V LEFT   COMPARISON:  06/03/2009.   FINDINGS: There is bilateral hip degenerative change with joint space narrowing and small osteophytes. Pelvic ring is intact. No acute fracture, dislocation or subluxation. No osteolytic or osteoblastic lesions. Pelvic calcifications are seen that may be bladder stones. Consider  further evaluation with noncontrast CT.   IMPRESSION: 1. Degenerative changes. No acute osseous abnormalities. 2. Possible bladder stones. Consider further evaluation with noncontrast CT.     Electronically Signed   By: Layla Maw M.D.   On: 11/04/2023 11:44   EXAM: LUMBAR SPINE - COMPLETE 4+ VIEW   COMPARISON:  08/16/2019   FINDINGS: Frontal, bilateral oblique, lateral views of the lumbar spine are obtained. There are 5 non-rib-bearing lumbar type vertebral bodies in normal anatomic alignment. No acute displaced fractures. Diffuse lumbar spondylosis and facet hypertrophy is noted, greatest from L3 through S1. Sacroiliac joints are normal.   IMPRESSION: 1. Multilevel spondylosis and facet hypertrophy without significant change since prior exam.     Electronically Signed   By: Sharlet Salina M.D.   On: 05/21/2021 09:19   I, Carl Wood, personally (independently) visualized and performed the interpretation of the images attached in this note.    Assessment and Plan: 83 y.o. adult with chronic left anterior hip pain.  Patient has mild hip arthritis seen on x-ray left hip from November 2024.  During that visit with Dr. Jean Rosenthal he had an interarticular left hip injection that he does not think helped very much.  He cannot remember if he had immediate pain relief but he notes that definitely did not provide any kind of lasting relief.  Pain is worse with activity and better with rest.  He notes that he has a implanted cardiac defibrillator that is due for device change in about a week  and a half.  He thinks his current device is not MRI compatible but he does not know if the new device will be MRI compatible.   Before we do more intervention such as repeat injections I think further imaging would be helpful.  Ideally I would like to do an MRI of the hip and lumbar spine to better explain the source of his anterior hip pain which could be hip arthritis or avascular necrosis  or hip flexor tendinitis or even lumbar radiculopathy at L1 or L2.  Ideally MRI of the hip and the lumbar spine would be done.  If he is not MRI compatible would proceed to noncontrast CT scan of the left hip and CT myelogram lumbar spine.  Will discuss with his cardiologist.   Addendum: Per cardiology. He has a "mixed " system w components from different companies.   We are working again in a protocol for [employer] but it is not a problem to get the MRI done at [facility].     PDMP not reviewed this encounter. Orders Placed This Encounter  Procedures   [place] LIMITED JOINT SPACE STRUCTURES LOW LEFT(NO LINKED CHARGES)    Reason for Exam (SYMPTOM  OR DIAGNOSIS REQUIRED):   left hip pain, left knee pain    Preferred imaging location?:   [facility]   No orders of the defined types were placed in this encounter.    Discussed warning signs or symptoms. Please see discharge instructions. Patient expresses understanding.   The above documentation has been reviewed and is accurate and complete Carl Wood, M.D.

## 2024-01-03 NOTE — Pre-Procedure Instructions (Signed)
Instructed patient on the following items: Arrival time 0800 Nothing to eat or drink after midnight No meds AM of procedure Responsible person to drive you home and stay with you for 24 hrs Wash with special soap night before and morning of procedure If on anti-coagulant drug instructions Xarelto- 1/26

## 2024-01-04 ENCOUNTER — Ambulatory Visit (HOSPITAL_COMMUNITY)
Admission: RE | Admit: 2024-01-04 | Discharge: 2024-01-04 | Disposition: A | Discharge status: Home / Self Care | Payer: Medicare HMO | Attending: Internal Medicine | Admitting: Internal Medicine

## 2024-01-04 ENCOUNTER — Other Ambulatory Visit: Payer: Self-pay

## 2024-01-04 ENCOUNTER — Ambulatory Visit (HOSPITAL_COMMUNITY)
Admission: RE | Disposition: A | Discharge status: Home / Self Care | Payer: Medicare HMO | Source: Home / Self Care | Attending: Internal Medicine

## 2024-01-04 DIAGNOSIS — I110 Hypertensive heart disease with heart failure: Secondary | ICD-10-CM | POA: Diagnosis not present

## 2024-01-04 DIAGNOSIS — I442 Atrioventricular block, complete: Secondary | ICD-10-CM | POA: Diagnosis not present

## 2024-01-04 DIAGNOSIS — I4821 Permanent atrial fibrillation: Secondary | ICD-10-CM | POA: Insufficient documentation

## 2024-01-04 DIAGNOSIS — Z4502 Encounter for adjustment and management of automatic implantable cardiac defibrillator: Secondary | ICD-10-CM | POA: Diagnosis not present

## 2024-01-04 DIAGNOSIS — D6869 Other thrombophilia: Secondary | ICD-10-CM | POA: Insufficient documentation

## 2024-01-04 DIAGNOSIS — I2510 Atherosclerotic heart disease of native coronary artery without angina pectoris: Secondary | ICD-10-CM | POA: Diagnosis not present

## 2024-01-04 DIAGNOSIS — E119 Type 2 diabetes mellitus without complications: Secondary | ICD-10-CM | POA: Diagnosis not present

## 2024-01-04 DIAGNOSIS — I5022 Chronic systolic (congestive) heart failure: Secondary | ICD-10-CM | POA: Insufficient documentation

## 2024-01-04 DIAGNOSIS — Z87891 Personal history of nicotine dependence: Secondary | ICD-10-CM | POA: Diagnosis not present

## 2024-01-04 DIAGNOSIS — G4733 Obstructive sleep apnea (adult) (pediatric): Secondary | ICD-10-CM | POA: Insufficient documentation

## 2024-01-04 DIAGNOSIS — I255 Ischemic cardiomyopathy: Secondary | ICD-10-CM

## 2024-01-04 DIAGNOSIS — Z951 Presence of aortocoronary bypass graft: Secondary | ICD-10-CM | POA: Insufficient documentation

## 2024-01-04 DIAGNOSIS — Z79899 Other long term (current) drug therapy: Secondary | ICD-10-CM | POA: Diagnosis not present

## 2024-01-04 DIAGNOSIS — Z7901 Long term (current) use of anticoagulants: Secondary | ICD-10-CM | POA: Insufficient documentation

## 2024-01-04 DIAGNOSIS — I447 Left bundle-branch block, unspecified: Secondary | ICD-10-CM | POA: Insufficient documentation

## 2024-01-04 DIAGNOSIS — E785 Hyperlipidemia, unspecified: Secondary | ICD-10-CM | POA: Diagnosis not present

## 2024-01-04 HISTORY — PX: ICD GENERATOR CHANGEOUT: EP1231

## 2024-01-04 LAB — GLUCOSE, CAPILLARY: Glucose-Capillary: 159 mg/dL — ABNORMAL HIGH (ref 70–99)

## 2024-01-04 SURGERY — ICD GENERATOR CHANGEOUT

## 2024-01-04 MED ORDER — FENTANYL CITRATE (PF) 100 MCG/2ML IJ SOLN
INTRAMUSCULAR | Status: AC
  Filled 2024-01-04: qty 2

## 2024-01-04 MED ORDER — SODIUM CHLORIDE 0.9 % IV SOLN
INTRAVENOUS | Status: AC
  Administered 2024-01-04: 80 mg
  Filled 2024-01-04: qty 2

## 2024-01-04 MED ORDER — LIDOCAINE HCL (PF) 1 % IJ SOLN
INTRAMUSCULAR | Status: AC
  Filled 2024-01-04: qty 60

## 2024-01-04 MED ORDER — VANCOMYCIN HCL IN DEXTROSE 1-5 GM/200ML-% IV SOLN
INTRAVENOUS | Status: AC
  Administered 2024-01-04: 1000 mg via INTRAVENOUS
  Filled 2024-01-04: qty 200

## 2024-01-04 MED ORDER — POVIDONE-IODINE 10 % EX SWAB: 2.0000 | Freq: Once | CUTANEOUS | Status: DC

## 2024-01-04 MED ORDER — SODIUM CHLORIDE 0.9 % IV SOLN: 80.0000 mg | INTRAVENOUS | Status: AC

## 2024-01-04 MED ORDER — SODIUM CHLORIDE 0.9 % IV SOLN: INTRAVENOUS | Status: DC

## 2024-01-04 MED ORDER — SODIUM CHLORIDE 0.9 % IV SOLN: 250.0000 mL | INTRAVENOUS | Status: DC

## 2024-01-04 MED ORDER — VANCOMYCIN HCL IN DEXTROSE 1-5 GM/200ML-% IV SOLN
1000.0000 mg | Freq: Once | INTRAVENOUS | Status: AC
Start: 2024-01-04 — End: 2024-01-04

## 2024-01-04 MED ORDER — MIDAZOLAM HCL 2 MG/2ML IJ SOLN
INTRAMUSCULAR | Status: AC
  Filled 2024-01-04: qty 2

## 2024-01-04 MED ORDER — SODIUM CHLORIDE 0.9% FLUSH: 3.0000 mL | INTRAVENOUS | Status: DC | PRN

## 2024-01-04 MED ORDER — MIDAZOLAM HCL 5 MG/5ML IJ SOLN
INTRAMUSCULAR | Status: DC | PRN
  Administered 2024-01-04 (×2): 1 mg via INTRAVENOUS

## 2024-01-04 MED ORDER — ACETAMINOPHEN 325 MG PO TABS: 325.0000 mg | ORAL_TABLET | ORAL | Status: DC | PRN

## 2024-01-04 MED ORDER — SODIUM CHLORIDE 0.9% FLUSH: 3.0000 mL | Freq: Two times a day (BID) | INTRAVENOUS | Status: DC

## 2024-01-04 MED ORDER — CEFAZOLIN SODIUM-DEXTROSE 2-4 GM/100ML-% IV SOLN: 2.0000 g | INTRAVENOUS | Status: DC

## 2024-01-04 MED ORDER — FENTANYL CITRATE (PF) 100 MCG/2ML IJ SOLN
INTRAMUSCULAR | Status: DC | PRN
  Administered 2024-01-04 (×2): 12.5 ug via INTRAVENOUS

## 2024-01-04 MED ORDER — LIDOCAINE HCL (PF) 1 % IJ SOLN
INTRAMUSCULAR | Status: DC | PRN
  Administered 2024-01-04: 50 mL

## 2024-01-04 MED ORDER — CHLORHEXIDINE GLUCONATE 4 % EX SOLN: 4.0000 | Freq: Once | CUTANEOUS | Status: DC

## 2024-01-04 SURGICAL SUPPLY — 8 items
DEVICE DISSECT PLASMABLAD 3.0S (MISCELLANEOUS) IMPLANT
ICD COBALT XT QUAD CRT DTPA2Q1 (ICD Generator) IMPLANT
PAD DEFIB RADIO PHYSIO CONN (PAD) ×1 IMPLANT
PIN PLUG IS-1 DEFIB (PIN) IMPLANT
PLASMABLADE 3.0S (MISCELLANEOUS) ×1
POUCH AIGIS-R ANTIBACT ICD (Mesh General) ×1 IMPLANT
POUCH AIGIS-R ANTIBACT ICD LRG (Mesh General) IMPLANT
TRAY PACEMAKER INSERTION (PACKS) ×1 IMPLANT

## 2024-01-04 NOTE — Interval H&P Note (Signed)
History and Physical Interval Note:  01/04/2024 9:21 AM  Carl Wood  has presented today for surgery, with the diagnosis of eri.  The various methods of treatment have been discussed with the patient and family. After consideration of risks, benefits and other options for treatment, the patient has consented to  Procedure(s): ICD GENERATOR CHANGEOUT (N/A) as a surgical intervention.  The patient's history has been reviewed, patient examined, no change in status, stable for surgery.  I have reviewed the patient's chart and labs.  Questions were answered to the patient's satisfaction.     [name]

## 2024-01-04 NOTE — Discharge Instructions (Addendum)

## 2024-01-05 ENCOUNTER — Encounter (HOSPITAL_COMMUNITY): Payer: Self-pay | Admitting: Internal Medicine

## 2024-01-05 ENCOUNTER — Telehealth: Payer: Self-pay | Admitting: Internal Medicine

## 2024-01-05 NOTE — Telephone Encounter (Signed)
Attempted return phone call to pt's daughter, left voicemail message to contact [phone] but RN would also reach out to pt.Marland Kitchen   Spoke with pt at his home number and advised per Dr Graciela Husbands pt does not need to be taking an antibiotic at this time.  Pt verbalizes understanding and thanked Charity fundraiser for the call.

## 2024-01-05 NOTE — Telephone Encounter (Signed)
Pt c/o medication issue:  1. Name of Medication:   Oral antibiotic  2. How are you currently taking this medication (dosage and times per day)? Not taking  3. Are you having a reaction (difficulty breathing--STAT)?   4. What is your medication issue?   Daughter Darral Dash) wants to know if patient needs to be on an oral antibiotic after his surgery.

## 2024-01-06 MED FILL — Midazolam HCl Inj 2 MG/2ML (Base Equivalent): INTRAMUSCULAR | Qty: 2 | Status: AC

## 2024-01-07 ENCOUNTER — Other Ambulatory Visit: Payer: Self-pay | Admitting: Family Medicine

## 2024-01-07 ENCOUNTER — Other Ambulatory Visit (HOSPITAL_COMMUNITY): Payer: Self-pay | Admitting: Internal Medicine

## 2024-01-09 ENCOUNTER — Ambulatory Visit: Payer: Medicare HMO | Attending: Cardiology

## 2024-01-09 DIAGNOSIS — I255 Ischemic cardiomyopathy: Secondary | ICD-10-CM

## 2024-01-09 NOTE — Patient Instructions (Signed)
 Follow up as scheduled.

## 2024-01-09 NOTE — Progress Notes (Signed)
Pt seen in device clinic post gen change for pressure dressing removal.  Pressure dressing removed.  Incision site intact.  No swelling or bleeding noted.  Some slight bruising noted.  Advised Pt to keep incision dry until wound check (steristrips).  Pt will continue to monitor and follow up for scheduled wound check next week January 19, 2024.

## 2024-01-10 ENCOUNTER — Encounter (HOSPITAL_COMMUNITY): Payer: Self-pay | Admitting: Internal Medicine

## 2024-01-10 ENCOUNTER — Ambulatory Visit (HOSPITAL_COMMUNITY)
Admission: RE | Admit: 2024-01-10 | Discharge: 2024-01-10 | Disposition: A | Discharge status: Home / Self Care | Payer: Medicare HMO | Source: Ambulatory Visit | Attending: Internal Medicine | Admitting: Internal Medicine

## 2024-01-10 VITALS — BP 112/74 | HR 101 | Wt 195.6 lb

## 2024-01-10 DIAGNOSIS — Z95810 Presence of automatic (implantable) cardiac defibrillator: Secondary | ICD-10-CM | POA: Insufficient documentation

## 2024-01-10 DIAGNOSIS — Z8673 Personal history of transient ischemic attack (TIA), and cerebral infarction without residual deficits: Secondary | ICD-10-CM | POA: Diagnosis not present

## 2024-01-10 DIAGNOSIS — Z87891 Personal history of nicotine dependence: Secondary | ICD-10-CM | POA: Insufficient documentation

## 2024-01-10 DIAGNOSIS — Z7901 Long term (current) use of anticoagulants: Secondary | ICD-10-CM | POA: Insufficient documentation

## 2024-01-10 DIAGNOSIS — I472 Ventricular tachycardia, unspecified: Secondary | ICD-10-CM | POA: Insufficient documentation

## 2024-01-10 DIAGNOSIS — I252 Old myocardial infarction: Secondary | ICD-10-CM | POA: Insufficient documentation

## 2024-01-10 DIAGNOSIS — Z79899 Other long term (current) drug therapy: Secondary | ICD-10-CM | POA: Insufficient documentation

## 2024-01-10 DIAGNOSIS — Z7984 Long term (current) use of oral hypoglycemic drugs: Secondary | ICD-10-CM | POA: Diagnosis not present

## 2024-01-10 DIAGNOSIS — Z9049 Acquired absence of other specified parts of digestive tract: Secondary | ICD-10-CM | POA: Insufficient documentation

## 2024-01-10 DIAGNOSIS — G4733 Obstructive sleep apnea (adult) (pediatric): Secondary | ICD-10-CM | POA: Insufficient documentation

## 2024-01-10 DIAGNOSIS — M25551 Pain in right hip: Secondary | ICD-10-CM | POA: Insufficient documentation

## 2024-01-10 DIAGNOSIS — I255 Ischemic cardiomyopathy: Secondary | ICD-10-CM | POA: Diagnosis not present

## 2024-01-10 DIAGNOSIS — I482 Chronic atrial fibrillation, unspecified: Secondary | ICD-10-CM | POA: Diagnosis not present

## 2024-01-10 DIAGNOSIS — I110 Hypertensive heart disease with heart failure: Secondary | ICD-10-CM | POA: Diagnosis not present

## 2024-01-10 DIAGNOSIS — E119 Type 2 diabetes mellitus without complications: Secondary | ICD-10-CM | POA: Insufficient documentation

## 2024-01-10 DIAGNOSIS — I2510 Atherosclerotic heart disease of native coronary artery without angina pectoris: Secondary | ICD-10-CM | POA: Insufficient documentation

## 2024-01-10 DIAGNOSIS — I4821 Permanent atrial fibrillation: Secondary | ICD-10-CM | POA: Diagnosis not present

## 2024-01-10 DIAGNOSIS — I5022 Chronic systolic (congestive) heart failure: Secondary | ICD-10-CM | POA: Insufficient documentation

## 2024-01-10 NOTE — Progress Notes (Signed)
 [facility] Note    Date:  01/10/2024   ID:  DANIELLE LENTO, DOB 07-Apr-1941, MRN [id number]  Location: Home  Provider location: [address] [place] Type of Visit: Established patient PCP:  Katrinka Garnette KIDD, MD  Cardiologist:Dr Turner  Primary HF: Dr Cherrie  EP: Dr Fernande   History of Present Illness:  Carl Wood is an 83 y/o male with CAD s/p MI 1983, ICM with chronic systolic heart failure s/p Medtronic single chamber ICD (EF 20%) CVA 2014, DM2 VT, permanent A fib on Xarelto , SDH 2019, OSA, and former smoker.   Had CRT-D upgrade in April 2018.    In 12/18 saw Dr. Skeet for chronic HA. Found to have small SDHs that were felt to be chronic after a previous fall when trimming vines. Xarelto  stopped. F/u C 2/19 SDHs resolved. Xarelto  restarted in 2/19  Echo 1/21   EF 20-25%  Echo 01/13/22 EF 25-30% RV ok   ABIs 2017 No PAD  Recently seen in EP clinic and SBP low so b-blocker stopped. Digoxin  started. Consideration was given to stopping Entresto  but patient didn't want to.  Here for f/u with his wife. Says he noticed any improvement in BP since stopping carvedilol . Says SBP runs 95-105. No dizziness or orthostasis. Main complaint is leg weakness as he stands. No falls. No CP or edema. Says overall stable. Can do ADLs without SOB but limited by severe R hip pain (saw Ortho and needs MRI at [facility])  Echo  07/25/23 limited windows. EF ~25-30%   Had ICD generator change last week   ICD interrogation:Limited data in setting of recent gen change. No VT/AF. Personally reviewed     Past Medical History:  Diagnosis Date   CAD (coronary artery disease)    CHF (congestive heart failure) (HCC)    Chronic atrial fibrillation (HCC)    Colon polyps    Diabetes mellitus    Diverticulosis of colon    DIVERTICULOSIS, COLON 10/16/2006   Qualifier: Diagnosis of  By: Tammie MD, Bruce     Excessive daytime sleepiness 02/19/2016   Hyperlipidemia     Hypertension    MI (mitral incompetence)    Nephrolithiasis    NEPHROLITHIASIS 06/26/2008   Qualifier: Diagnosis of  By: Delos, CMA, Cindy     OSA (obstructive sleep apnea) 02/19/2016   Moderate to severe OSA with an AHI of 25/hr and on CPAP at 8cm H2O   PE (pulmonary embolism)    V-tach [facility])    Past Surgical History:  Procedure Laterality Date   BACK SURGERY     BASAL CELL CARCINOMA EXCISION     nose   BIV UPGRADE N/A 03/28/2017   Procedure: BiV Upgrade;  Surgeon: Elspeth JAYSON Fernande, MD;  Location: [facility] INVASIVE CV LAB;  Service: Cardiovascular;  Laterality: N/A;   CARDIAC CATHETERIZATION N/A 01/20/2016   Procedure: Right/Left Heart Cath and Coronary Angiography;  Surgeon: Toribio JONELLE Cherrie, MD;  Location: [facility] INVASIVE CV LAB;  Service: Cardiovascular;  Laterality: N/A;   CARDIAC DEFIBRILLATOR PLACEMENT     medtronic virtuoso   CHOLECYSTECTOMY     COLONOSCOPY  12/11/2012   Procedure: COLONOSCOPY;  Surgeon: Lamar JONETTA Aho, MD;  Location: [facility] ENDOSCOPY;  Service: Endoscopy;  Laterality: N/A;   ICD GENERATOR CHANGEOUT N/A 01/04/2024   Procedure: ICD GENERATOR CHANGEOUT;  Surgeon: Fernande Elspeth JAYSON, MD;  Location: [facility] INVASIVE CV LAB;  Service: Cardiovascular;  Laterality: N/A;   KNEE ARTHROPLASTY  Current Outpatient Medications  Medication Sig Dispense Refill   ascorbic acid (VITAMIN C) 500 MG tablet Take 500 mg by mouth daily.     blood glucose meter kit and supplies KIT Dispense based on patient and insurance preference. Use up to four times daily as directed. 1 each 0   cetirizine (ZYRTEC) 10 MG tablet Take 10 mg by mouth daily.     digoxin  (LANOXIN ) 0.25 MG tablet Take 0.5 tablets (0.125 mg total) by mouth daily. 45 tablet 3   empagliflozin  (JARDIANCE ) 10 MG TABS tablet Take 1 tablet (10 mg total) by mouth daily. 90 tablet 3   ENTRESTO  24-26 MG Take 1 tablet by mouth twice daily 180 tablet 0   ezetimibe  (ZETIA ) 10 MG tablet Take 1 tablet (10 mg total) by mouth daily. 90 tablet 3    furosemide  (LASIX ) 20 MG tablet Take 1 tablet by mouth once a week 12 tablet 0   glimepiride  (AMARYL ) 4 MG tablet TAKE 2 TABLETS BY MOUTH ONCE DAILY WITH BREAKFAST 180 tablet 0   glucose blood (ACCU-CHEK GUIDE) test strip USE UP TO FOUR TIMES DAILY Dx: E11.9 450 each 3   metFORMIN  (GLUCOPHAGE -XR) 500 MG 24 hr tablet TAKE 2 TABLETS BY MOUTH IN THE MORNING AND 2 AT BEDTIME 360 tablet 0   metoprolol  succinate (TOPROL  XL) 25 MG 24 hr tablet Take 1 tablet (25 mg total) by mouth at bedtime. 90 tablet 2   rivaroxaban  (XARELTO ) 20 MG TABS tablet Take 1 tablet by mouth once daily 90 tablet 2   rosuvastatin  (CRESTOR ) 40 MG tablet Take 1 tablet by mouth once daily 90 tablet 0   TURMERIC PO Take 1,400 mg by mouth 2 (two) times daily.     No current facility-administered medications for this encounter.    Allergies:   Penicillins and Sulfamethoxazole   Social History:  The patient  reports that he quit smoking about 11 years ago. His smoking use included cigarettes. He started smoking about 61 years ago. He has a 50 pack-year smoking history. He has never used smokeless tobacco. He reports that he does not drink alcohol and does not use drugs.   Family History:  The patient's family history includes Bladder Cancer in his mother; Colon cancer in his father; Diabetes in his father; Heart attack (age of onset: 36) in his child; Heart attack (age of onset: 63) in his child; Heart disease in his father and mother.   ROS:  Please see the history of present illness.   All other systems are personally reviewed and negative.   Vitals:   01/10/24 1432 01/10/24 1504  BP: (!) 90/50 112/74  Pulse: (!) 101   SpO2: 94%      Exam:  General:  Well appearing.Elderly  No resp difficulty HEENT: normal Neck: supple. no JVD. Carotids 2+ bilat; no bruits. No lymphadenopathy or thryomegaly appreciated. Cor: PMI nondisplaced. Irregular rate & rhythm. No rubs, gallops or murmurs. ICD site ok  Lungs: clear Abdomen: soft,  nontender, nondistended. No hepatosplenomegaly. No bruits or masses. Good bowel sounds. Extremities: no cyanosis, clubbing, rash, edema Neuro: alert & orientedx3, cranial nerves grossly intact. moves all 4 extremities w/o difficulty. Affect pleasant  ECG AF 96 LV pacing Personally reviewed  Recent Labs: 07/25/2023: B Natriuretic Peptide 77.2 12/08/2023: ALT 17; BUN 25; Creatinine, Ser 0.99; Hemoglobin 16.1; Platelets 168.0; Potassium 4.4; Sodium 140   Wt Readings from Last 3 Encounters:  01/10/24 88.7 kg (195 lb 9.6 oz)  01/04/24 89.8 kg (198 lb)  12/26/23 89.4 kg (197 lb)    ASSESSMENT AND PLAN:  1. Chronic Systolic HF: ICM, s/p Medtronic Bi-V ICD (upgrade 4/18). Echo 09/2016 EF 25-30%. ECHO 8/19 EF 20% - Echo 12/12/19 EF 20-25% RV ok  - Stable NYHA III - SBP running 100-110 at home. Initially 90 here but on recheck 112 - Volume status ok on exam and ICD interrogation today - Echo  01/13/22 EF stable 25-30% RV ok - Echo 07/25/23 EF 25-30% Personally reviewed - Carvedilol  3.125 bid stopped recently by EP due to low BP  - Continue digoxin . Check level today - Continue Entresto  to 24/26 mg BID. If SBP consistently below 100 at home drop Entresto  to 12/13 bid  - Continue Jardiance . - Continue Toprol  25 qhs - No spiro with history of hyperkalemia and soft BP.  -s/p ICD generator change 1/25. Site looks good. Interrogation    2. OSA - Continue CPAP   3. Chronic A fib - Rate controlled. Well tolerated - Xarelto  restarted after resolution of SDH.  - No evidence of recent bleeding   4. CAD: - Has known distal LAD lesion 95% and mid LAD 40%. No benefit to revascularization given wall motion abnormality.  - No s/s angina - Continue statin. Off ASA with Xarelto   5. NSVT  - stable - continue b-blocker - No VT on ICD today   Signed, Toribio Fuel, MD  3:05 PM  [facility] [address] [facility] [place] [address] [phone]  (office) [phone] (fax)

## 2024-01-10 NOTE — Patient Instructions (Signed)
 Great to see you today!!!  Your physician recommends that you schedule a follow-up appointment in: 4 months (June), *PLEASE CALL OUR OFFICE IN APRIL TO SCHEDULE THIS APPOINTMENT  If you have any questions or concerns before your next appointment please send us  a message through mychart or call our office at [phone].    TO LEAVE A MESSAGE FOR THE NURSE SELECT OPTION 2, PLEASE LEAVE A MESSAGE INCLUDING: YOUR NAME DATE OF BIRTH CALL BACK NUMBER REASON FOR CALL**this is important as we prioritize the call backs  YOU WILL RECEIVE A CALL BACK THE SAME DAY AS LONG AS YOU CALL BEFORE 4:00 PM  At the [facility], you and your health needs are our priority. As part of our continuing mission to provide you with exceptional heart care, we have created designated Provider Care Teams. These Care Teams include your primary Cardiologist (physician) and Advanced Practice Providers (APPs- Physician Assistants and Nurse Practitioners) who all work together to provide you with the care you need, when you need it.   You may see any of the following providers on your designated Care Team at your next follow up: Dr Toribio Fuel Dr Ezra Shuck Dr. Ria Commander Dr. Morene Brownie Amy Lenetta, NP Caffie Shed, [place] [facility] [place], [place] Beckey Coe, NP Jordan Lee, NP Tinnie Redman, PharmD   Please be sure to bring in all your medications bottles to every appointment.    Thank you for choosing [facility]

## 2024-01-19 ENCOUNTER — Ambulatory Visit: Payer: Medicare HMO | Attending: Cardiology

## 2024-01-19 DIAGNOSIS — I4729 Other ventricular tachycardia: Secondary | ICD-10-CM | POA: Diagnosis not present

## 2024-01-19 DIAGNOSIS — I5022 Chronic systolic (congestive) heart failure: Secondary | ICD-10-CM

## 2024-01-19 LAB — CUP PACEART INCLINIC DEVICE CHECK
Date Time Interrogation Session: 20250213150001
Implantable Lead Connection Status: 753985
Implantable Lead Connection Status: 753985
Implantable Lead Implant Date: 20010723
Implantable Lead Implant Date: 20180423
Implantable Lead Location: 753858
Implantable Lead Location: 753860
Implantable Lead Model: 6943
Implantable Pulse Generator Implant Date: 20250129

## 2024-01-19 NOTE — Patient Instructions (Addendum)
? ?  After Your ICD ?(Implantable Cardiac Defibrillator) ? ? ? ?Monitor your defibrillator site for redness, swelling, and drainage. Call the device clinic at [phone] if you experience these symptoms or fever/chills. ? ?Your incision was closed with Dermabond:  You may shower 1 day after your defibrillator implant and wash your incision with soap and water. Avoid lotions, ointments, or perfumes over your incision until it is well-healed. ? ?You may use a hot tub or a pool after your wound check appointment if the incision is completely closed. ? ?Do not lift, push or pull greater than 10 pounds with the affected arm until 6 weeks after your procedure. There are no other restrictions in arm movement after your wound check appointment. ? ?Your ICD is designed to protect you from life threatening heart rhythms. Because of this, you may receive a shock.  ? ?1 shock with no symptoms:  Call the office during business hours. ?1 shock with symptoms (chest pain, chest pressure, dizziness, lightheadedness, shortness of breath, overall feeling unwell):  Call 911. ?If you experience 2 or more shocks in 24 hours:  Call 911. ?If you receive a shock, you should not drive.  ?[place] DMV - no driving for 6 months if you receive appropriate therapy from your ICD.  ? ?ICD Alerts:  Some alerts are vibratory and others beep. These are NOT emergencies. Please call our office to let us know. If this occurs at night or on weekends, it can wait until the next business day. Send a remote transmission. ? ?If your device is capable of reading fluid status (for heart failure), you will be offered monthly monitoring to review this with you.  ? ?Remote monitoring is used to monitor your ICD from home. This monitoring is scheduled every 91 days by our office. It allows [place] to keep an eye on the functioning of your device to ensure it is working properly. You will routinely see your Electrophysiologist annually (more often if necessary).  ?

## 2024-01-19 NOTE — Progress Notes (Signed)
Normal ICD wound check. Wound well healed. Thresholds, sensing, and impedances consistent with implant measurements with 3.5V safety margin/auto capture until 3 month visit. No episodes.  Reviewed arm restrictions to continue for 6 weeks total post op. Reviewed shock plan.  Pt enrolled in remote follow-up.

## 2024-01-30 ENCOUNTER — Encounter: Payer: Medicare HMO | Admitting: Pulmonary Disease

## 2024-02-06 ENCOUNTER — Telehealth: Payer: Self-pay | Admitting: Internal Medicine

## 2024-02-06 NOTE — Telephone Encounter (Signed)
 Patient states during recent appointment [name] called [facility] which he states is the only facility that does MRI's with ICD compatibility. Patient didn't schedule at the time because he wanted to recoup from procedure first. Patient states he is ready to schedule now.

## 2024-02-06 NOTE — Telephone Encounter (Signed)
 Routing about device update.

## 2024-02-07 NOTE — Telephone Encounter (Signed)
 Spoke with pt who reports per his Ortho provider he needs to have an MRI of his hip and lumbar spine.  Pt does have a mixed system and was advised by Dr Graciela Husbands he will need consult with [facility] to coordinate MRI with their facility.  Pt advised will make arrangements with [facility] and pt will be contacted re: scheduling.  Pt verbalizes understanding and agrees with current plan.

## 2024-02-22 NOTE — Telephone Encounter (Signed)
 Spoke with pt who states he has been scheduled with [facility] on 03/12/2024 to discuss MRI.  Pt thanked Charity fundraiser for the assistance with scheduling.

## 2024-02-27 ENCOUNTER — Ambulatory Visit: Admitting: Family Medicine

## 2024-02-27 ENCOUNTER — Other Ambulatory Visit: Payer: Self-pay

## 2024-02-27 VITALS — BP 122/78 | HR 92 | Ht 72.0 in

## 2024-02-27 DIAGNOSIS — M25562 Pain in left knee: Secondary | ICD-10-CM | POA: Diagnosis not present

## 2024-02-27 DIAGNOSIS — M1712 Unilateral primary osteoarthritis, left knee: Secondary | ICD-10-CM | POA: Diagnosis not present

## 2024-02-27 DIAGNOSIS — G8929 Other chronic pain: Secondary | ICD-10-CM | POA: Diagnosis not present

## 2024-02-27 DIAGNOSIS — M25552 Pain in left hip: Secondary | ICD-10-CM | POA: Diagnosis not present

## 2024-02-27 NOTE — Progress Notes (Signed)
 Carl Payor, PhD, LAT, ATC acting as a scribe for Carl Graham, MD.  Carl Wood is a 83 y.o. adult who presents to [facility] at [facility] today for re-occurring L hip and L knee pain. Pt was last seen by Dr. Denyse Amass on 12/26/23 and his cardiologist was consulted about MRI compatibility do to implanted cardiac defibrillator.  Today, pt reports both L knee and hip are terrible. He has since got a new defibrillator. He has a consult at [facility] on 4/7 to see if they can do a MRI. Pt locates L hip pain to deep within the groin.  Dx imaging: 10/18/23 L hip XR             12/21/22 L knee XR  Pertinent review of systems: No fevers or chills  Relevant historical information: Implanted cardiac defibrillator.  Atrial fibrillation. Sleep apnea and COPD.  Hypertension.  Heart failure.  Diabetes.  Exam:  BP 122/78   Pulse 92   Ht 6' (1.829 m)   SpO2 95%   BMI 26.53 kg/m  General: Well Developed, well nourished, and in no acute distress.   MSK: Left knee mild effusion normal-appearing otherwise decreased range of motion.  Tender palpation.  Laxity to MCL stress test.  Left hip decreased range of motion.    Lab and Radiology Results  Procedure: Real-time Ultrasound Guided Injection of left knee joint superior lateral patella space Device: Philips Affiniti 50G/GE Logiq Images permanently stored and available for review in PACS Verbal informed consent obtained.  Discussed risks and benefits of procedure. Warned about infection, bleeding, hyperglycemia damage to structures among others. Patient expresses understanding and agreement Time-out conducted.   Noted no overlying erythema, induration, or other signs of local infection.   Skin prepped in a sterile fashion.   Local anesthesia: Topical Ethyl chloride.   With sterile technique and under real time ultrasound guidance: 40 mg of Kenalog and 2 mL of Marcaine injected into knee joint. Fluid seen entering the joint  capsule.   Completed without difficulty   Pain immediately resolved suggesting accurate placement of the medication.   Advised to call if fevers/chills, erythema, induration, drainage, or persistent bleeding.   Images permanently stored and available for review in the ultrasound unit.  Impression: Technically successful ultrasound guided injection.   X-ray images left knee obtained about a year ago January 2024 shows severe medial DJD.  X-ray images obtained at that date personally and independently interpreted today.    Assessment and Plan: 82 y.o. adult with chronic left knee pain due to DJD.  I do not think there is need to do an MRI of the left knee.  He has x-ray findings showing medial DJD they do explain his pain well enough.  Plan for intra-articular steroid injection and consider medial off loader knee brace to address DJD and instability on exam.  As for his left hip pain his most recent hip x-ray from January 2024 shows only mild arthritis.  This could potentially benefit from MRI.  He will return next week for anticipated left intra-articular hip injection.  He had mediocre results with previous injection.  If he cannot have an MRI it may be reasonable.  I think MRI is gena be challenging with his current defibrillator situation.   PDMP not reviewed this encounter. Orders Placed This Encounter  Procedures   [place] LIMITED JOINT SPACE STRUCTURES LOW LEFT(NO LINKED CHARGES)    Reason for Exam (SYMPTOM  OR DIAGNOSIS REQUIRED):  left knee pain    Preferred imaging location?:   [facility]   No orders of the defined types were placed in this encounter.    Discussed warning signs or symptoms. Please see discharge instructions. Patient expresses understanding.   The above documentation has been reviewed and is accurate and complete Carl Wood, M.D.

## 2024-02-27 NOTE — Patient Instructions (Addendum)
 Thank you for coming in today.   You received an injection today. Seek immediate medical attention if the joint becomes red, extremely painful, or is oozing fluid.   Follow up in about a week and we can injection your hip.

## 2024-03-05 ENCOUNTER — Other Ambulatory Visit: Payer: Self-pay

## 2024-03-05 ENCOUNTER — Ambulatory Visit: Admitting: Family Medicine

## 2024-03-05 VITALS — BP 110/60 | HR 98 | Ht 72.0 in

## 2024-03-05 DIAGNOSIS — M25552 Pain in left hip: Secondary | ICD-10-CM | POA: Diagnosis not present

## 2024-03-05 NOTE — Patient Instructions (Signed)
Thank you for coming in today.   Call or go to the ER if you develop a large red swollen joint with extreme pain or oozing puss.    Let me know how this goes.

## 2024-03-05 NOTE — Progress Notes (Signed)
   I, Rolland Bimler am a scribe for Dr. Denyse Amass.  Carl Wood is a 83 y.o. adult who presents to [facility] at [facility] today for f/u L hip pain. Pt was last seen by Dr. Denyse Amass on 02/27/24 and was given a L knee steroid injection and to return in 1 wk for his hip.  Today, pt reports says the hip is no better. The knee is a lot better.   Dx imaging: 10/18/23 L hip XR   Pertinent review of systems: No fevers or chills  Relevant historical information: Atrial fibrillation coronary artery disease.  ICD/pacemaker   Exam:  BP 110/60   Pulse 98   Ht 6' (1.829 m)   SpO2 96%   BMI 26.53 kg/m  General: Well Developed, well nourished, and in no acute distress.   MSK: Left hip decreased range of motion.  Nontender.    Lab and Radiology Results  Procedure: Real-time Ultrasound Guided Injection of left hip femoral acetabular joint anterior approach Device: Philips Affiniti 50G/GE Logiq Images permanently stored and available for review in PACS Verbal informed consent obtained.  Discussed risks and benefits of procedure. Warned about infection, bleeding, hyperglycemia damage to structures among others. Patient expresses understanding and agreement Time-out conducted.   Noted no overlying erythema, induration, or other signs of local infection.   Skin prepped in a sterile fashion.   Local anesthesia: Topical Ethyl chloride.   With sterile technique and under real time ultrasound guidance: 40 mg of Kenalog and 2 ml of Marcaine injected into left hip joint. Fluid seen entering the joint capsule.   Completed without difficulty   Pain immediately resolved suggesting accurate placement of the medication.   Advised to call if fevers/chills, erythema, induration, drainage, or persistent bleeding.   Images permanently stored and available for review in the ultrasound unit.  Impression: Technically successful ultrasound guided injection.        Assessment and  Plan: 83 y.o. adult with chronic left hip pain due to primarily to DJD.  Patient had great immediate benefit following steroid and Marcaine injection indicating that his hip joint is the primary pain generator.  Patient may have an MRI upcoming at [facility] in the near future which could be helpful.   PDMP not reviewed this encounter. Orders Placed This Encounter  Procedures   [place] LIMITED JOINT SPACE STRUCTURES LOW LEFT(NO LINKED CHARGES)    Reason for Exam (SYMPTOM  OR DIAGNOSIS REQUIRED):   hip pain    Preferred imaging location?:   [facility]   No orders of the defined types were placed in this encounter.    Discussed warning signs or symptoms. Please see discharge instructions. Patient expresses understanding.   The above documentation has been reviewed and is accurate and complete [name], M.D.

## 2024-03-08 DIAGNOSIS — L905 Scar conditions and fibrosis of skin: Secondary | ICD-10-CM | POA: Diagnosis not present

## 2024-03-08 DIAGNOSIS — Z85828 Personal history of other malignant neoplasm of skin: Secondary | ICD-10-CM | POA: Diagnosis not present

## 2024-03-08 DIAGNOSIS — Z08 Encounter for follow-up examination after completed treatment for malignant neoplasm: Secondary | ICD-10-CM | POA: Diagnosis not present

## 2024-03-08 DIAGNOSIS — L570 Actinic keratosis: Secondary | ICD-10-CM | POA: Diagnosis not present

## 2024-03-12 DIAGNOSIS — M25552 Pain in left hip: Secondary | ICD-10-CM | POA: Diagnosis not present

## 2024-03-12 DIAGNOSIS — I5022 Chronic systolic (congestive) heart failure: Secondary | ICD-10-CM | POA: Diagnosis not present

## 2024-03-12 DIAGNOSIS — M25562 Pain in left knee: Secondary | ICD-10-CM | POA: Diagnosis not present

## 2024-03-12 DIAGNOSIS — M545 Low back pain, unspecified: Secondary | ICD-10-CM | POA: Diagnosis not present

## 2024-03-12 DIAGNOSIS — G8929 Other chronic pain: Secondary | ICD-10-CM | POA: Diagnosis not present

## 2024-03-12 DIAGNOSIS — Z4502 Encounter for adjustment and management of automatic implantable cardiac defibrillator: Secondary | ICD-10-CM | POA: Diagnosis not present

## 2024-03-13 ENCOUNTER — Telehealth: Payer: Self-pay | Admitting: Family Medicine

## 2024-03-13 DIAGNOSIS — M1712 Unilateral primary osteoarthritis, left knee: Secondary | ICD-10-CM

## 2024-03-13 DIAGNOSIS — M25552 Pain in left hip: Secondary | ICD-10-CM

## 2024-03-13 DIAGNOSIS — G8929 Other chronic pain: Secondary | ICD-10-CM

## 2024-03-13 DIAGNOSIS — M1612 Unilateral primary osteoarthritis, left hip: Secondary | ICD-10-CM

## 2024-03-13 DIAGNOSIS — M5416 Radiculopathy, lumbar region: Secondary | ICD-10-CM

## 2024-03-13 NOTE — Telephone Encounter (Signed)
 Forwarding to Dr. Denyse Amass to review and advise.

## 2024-03-13 NOTE — Telephone Encounter (Signed)
 Recd an email from Cardiology and [facility]. Imaging. Pt is requesting several MRI's and because of a device he has implanted, he has to do these at [facility].  Per [facility], pt has requested  MRI L knee MRI L hip MRI Lumbar spine  Is Dr. Denyse Amass OK with ordering these? Also need to know if they will be with or without contract.

## 2024-03-14 DIAGNOSIS — I5022 Chronic systolic (congestive) heart failure: Secondary | ICD-10-CM | POA: Diagnosis not present

## 2024-03-14 NOTE — Telephone Encounter (Signed)
 Ok to order all without contrast

## 2024-03-14 NOTE — Addendum Note (Signed)
 Addended by: Dierdre Searles on: 03/14/2024 03:17 PM   Modules accepted: Orders

## 2024-03-14 NOTE — Telephone Encounter (Signed)
 Orders have been placed for the following:  MRI L-spine w/o CM MRI Left Hip w/o CM MRI L Knee w/o CM  Note made on orders to have done at [facility].

## 2024-03-16 ENCOUNTER — Telehealth: Payer: Self-pay

## 2024-03-16 NOTE — Telephone Encounter (Signed)
 Returned call but no answer. Left VM with message to state his insurance does not authorize LDCT at age 83 years.  Left call back number if questions

## 2024-03-16 NOTE — Telephone Encounter (Signed)
 Copied from CRM [id number]. Topic: Clinical - Request for Lab/Test Order >> Mar 15, 2024  3:56 PM Amalia Hailey F wrote: Reason for CRM: Patient stated he has a ct scan done every year and would like another order placed to complete a CT CHEST LUNG CA SCREEN LOW DOSE W/O CM. Please follow up with the patient at [phone].  Please advise pt is requesting yearly lung cancer ct screen

## 2024-03-18 ENCOUNTER — Other Ambulatory Visit: Payer: Self-pay | Admitting: Family Medicine

## 2024-03-26 ENCOUNTER — Other Ambulatory Visit: Payer: Self-pay

## 2024-03-26 ENCOUNTER — Ambulatory Visit (INDEPENDENT_AMBULATORY_CARE_PROVIDER_SITE_OTHER)

## 2024-03-26 ENCOUNTER — Ambulatory Visit: Admitting: Family Medicine

## 2024-03-26 ENCOUNTER — Other Ambulatory Visit: Payer: Self-pay | Admitting: Family Medicine

## 2024-03-26 VITALS — Ht 72.0 in | Wt 192.0 lb

## 2024-03-26 DIAGNOSIS — R070 Pain in throat: Secondary | ICD-10-CM | POA: Diagnosis not present

## 2024-03-26 DIAGNOSIS — M1712 Unilateral primary osteoarthritis, left knee: Secondary | ICD-10-CM | POA: Diagnosis not present

## 2024-03-26 DIAGNOSIS — M25562 Pain in left knee: Secondary | ICD-10-CM | POA: Diagnosis not present

## 2024-03-26 DIAGNOSIS — G8929 Other chronic pain: Secondary | ICD-10-CM

## 2024-03-26 DIAGNOSIS — R509 Fever, unspecified: Secondary | ICD-10-CM | POA: Diagnosis not present

## 2024-03-26 DIAGNOSIS — R0981 Nasal congestion: Secondary | ICD-10-CM | POA: Diagnosis not present

## 2024-03-26 DIAGNOSIS — R051 Acute cough: Secondary | ICD-10-CM | POA: Diagnosis not present

## 2024-03-26 NOTE — Progress Notes (Signed)
   Carl Muck, PhD, LAT, ATC acting as a scribe for Carl Juniper, MD.  Carl Wood is a 83 y.o. adult who presents to [facility] at [facility] today for cont'd L knee pain. Pt was last seen for his knee on 02/27/24 and was given a steroid injection.  Today, pt reports prior L knee steroid injection only lasted for a little while. He c/o pain is different and the knee is almost giving out on him. He is wondering if a custom fit knee brace would help.  Patient is arranging MRI for [facility] to be done in a safe setting for his pacemaker.  He is not sure if he can lay down for an hour and a half on his back to proceed with MRI of the L-spine hip and knee that have all been ordered.  He was seen at [facility] this morning for a sinus infection.  Dx imaging: 12/21/22 L knee XR  Pertinent review of systems: No fevers or chills  Relevant historical information: Pacemaker. Heart disease  Exam:  Ht 6' (1.829 m)   Wt 192 lb (87.1 kg)   BMI 26.04 kg/m  General: Well Developed, well nourished, and in no acute distress.   MSK: Left knee mild effusion intact range of motion.  Laxity to MCL stress test. Intact strength.    Lab and Radiology Results  X-ray images left knee obtained today personally and independently Interpreted. Severe medial DJD.  No acute fractures. Await formal radiology review   Assessment and Plan: 83 y.o. adult with left knee pain due to severe DJD.  Unfortunately conventional steroid injection did not last very long.  We talked about knee replacement or even genicular artery embolization.  We are going to try to get Zilretta  or gel injections authorized and proceed with those next.  Additionally will use a medial off loader knee brace.  This will help for his pain from osteoarthritis as well for his instability seen on exam.  He should hear from my office soon about setting up these injections.  We also talked about his MRIs.  I think he  should definitely schedule all 3 MRIs all at once.  If he just cannot lay in his back long enough to proceed with 3 MRIs we can prioritize which ones to do.  I certainly could prescribe some pain medicines or antianxiety medicines ahead of time as well.   PDMP not reviewed this encounter. Orders Placed This Encounter  Procedures   DG Knee AP/LAT W/Sunrise Left    Standing Status:   Future    Number of Occurrences:   1    Expiration Date:   04/25/2024    Reason for Exam (SYMPTOM  OR DIAGNOSIS REQUIRED):   left knee pain    Preferred imaging location?:   [place]   No orders of the defined types were placed in this encounter.    Discussed warning signs or symptoms. Please see discharge instructions. Patient expresses understanding.   The above documentation has been reviewed and is accurate and complete Carl Wood, M.D.

## 2024-03-26 NOTE — Patient Instructions (Addendum)
 Thank you for coming in today.   Please get an Xray today before you leave   We will work to authorize Zilretta . You will hear from our insurance once we get insurance approval  Orelia Binet from [facility] will contact you able the knee brace  Schedule the MRI's

## 2024-03-29 NOTE — Progress Notes (Signed)
 Left knee x-ray shows severe arthritis.

## 2024-03-30 ENCOUNTER — Telehealth: Payer: Self-pay

## 2024-03-30 NOTE — Telephone Encounter (Signed)
 Can you schedule patient when medication is stocked. Thank you   Zilretta  authorized for left knee $15 copay Deductible does not apply OOP MAX $6750 has met $196.44 Once OOP has been met copay will no longer apply Auth # [id number] EXP: 10/225

## 2024-04-02 ENCOUNTER — Other Ambulatory Visit (HOSPITAL_COMMUNITY): Payer: Self-pay | Admitting: Internal Medicine

## 2024-04-02 ENCOUNTER — Other Ambulatory Visit: Payer: Self-pay | Admitting: Family Medicine

## 2024-04-02 NOTE — Telephone Encounter (Signed)
 Scheduled

## 2024-04-04 ENCOUNTER — Telehealth: Payer: Self-pay | Admitting: Internal Medicine

## 2024-04-04 NOTE — Telephone Encounter (Signed)
 Spoke with patient to reschedule his 5/6 appointment with Dr. Rodolfo Clan. Advised the patient that Dr. Rodolfo Clan is on medical leave, and will be out for an indeterminate amount of time, so due to this being a follow up for his ICD gen change, I needed to get him rescheduled to see a PA in the mean time. Patient states he only wants to see Dr. Rodolfo Clan, advised the patient again that he wouldn't be seeing Dr. Rodolfo Clan until mid/late June or early July, and that is too far out for follow up from his gen change, and urged patient again that it would be best to reschedule with a PA. He states as long as it with a doctor. Advised him that that would be with Dr. Rodolfo Clan, but I can't at the moment for the time frame he needs. Patient stated to 'just forget it' and hung up.

## 2024-04-05 ENCOUNTER — Ambulatory Visit (INDEPENDENT_AMBULATORY_CARE_PROVIDER_SITE_OTHER): Payer: Medicare HMO

## 2024-04-05 DIAGNOSIS — I472 Ventricular tachycardia, unspecified: Secondary | ICD-10-CM

## 2024-04-05 LAB — CUP PACEART REMOTE DEVICE CHECK
Battery Remaining Longevity: 92 mo
Battery Voltage: 3.07 V
Brady Statistic RV Percent Paced: 92.43 %
Date Time Interrogation Session: 20250501042145
HighPow Impedance: 73 Ohm
Implantable Lead Connection Status: 753985
Implantable Lead Connection Status: 753985
Implantable Lead Implant Date: 20010723
Implantable Lead Implant Date: 20180423
Implantable Lead Location: 753858
Implantable Lead Location: 753860
Implantable Lead Model: 6943
Implantable Pulse Generator Implant Date: 20250129
Lead Channel Impedance Value: 1026 Ohm
Lead Channel Impedance Value: 285 Ohm
Lead Channel Impedance Value: 3000 Ohm
Lead Channel Impedance Value: 418 Ohm
Lead Channel Impedance Value: 475 Ohm
Lead Channel Impedance Value: 475 Ohm
Lead Channel Impedance Value: 513 Ohm
Lead Channel Impedance Value: 570 Ohm
Lead Channel Impedance Value: 893 Ohm
Lead Channel Impedance Value: 912 Ohm
Lead Channel Impedance Value: 931 Ohm
Lead Channel Impedance Value: 950 Ohm
Lead Channel Impedance Value: 969 Ohm
Lead Channel Pacing Threshold Amplitude: 1.375 V
Lead Channel Pacing Threshold Amplitude: 1.75 V
Lead Channel Pacing Threshold Pulse Width: 0.4 ms
Lead Channel Pacing Threshold Pulse Width: 0.5 ms
Lead Channel Sensing Intrinsic Amplitude: 13.6 mV
Lead Channel Setting Pacing Amplitude: 2.25 V
Lead Channel Setting Pacing Amplitude: 2.75 V
Lead Channel Setting Pacing Pulse Width: 0.4 ms
Lead Channel Setting Pacing Pulse Width: 0.5 ms
Lead Channel Setting Sensing Sensitivity: 0.3 mV
Zone Setting Status: 755011

## 2024-04-09 ENCOUNTER — Ambulatory Visit (INDEPENDENT_AMBULATORY_CARE_PROVIDER_SITE_OTHER): Admitting: Family Medicine

## 2024-04-09 ENCOUNTER — Other Ambulatory Visit: Payer: Self-pay

## 2024-04-09 DIAGNOSIS — G8929 Other chronic pain: Secondary | ICD-10-CM

## 2024-04-09 DIAGNOSIS — M1712 Unilateral primary osteoarthritis, left knee: Secondary | ICD-10-CM | POA: Diagnosis not present

## 2024-04-09 DIAGNOSIS — M25562 Pain in left knee: Secondary | ICD-10-CM

## 2024-04-09 MED ORDER — TRIAMCINOLONE ACETONIDE 32 MG IX SRER
32.0000 mg | Freq: Once | INTRA_ARTICULAR | Status: AC
Start: 2024-04-09 — End: 2024-04-09
  Administered 2024-04-09: 32 mg via INTRA_ARTICULAR

## 2024-04-09 NOTE — Patient Instructions (Signed)
 Thank you for coming in today.   You received an injection today. Seek immediate medical attention if the joint becomes red, extremely painful, or is oozing fluid.

## 2024-04-09 NOTE — Progress Notes (Signed)
  Zilretta injection left knee Procedure: Real-time Ultrasound Guided Injection of left knee joint superior lateral patellar space Device: Philips Affiniti 50G Images permanently stored and available for review in PACS Verbal informed consent obtained.  Discussed risks and benefits of procedure. Warned about infection, hyperglycemia bleeding, damage to structures among others. Patient expresses understanding and agreement Time-out conducted.   Noted no overlying erythema, induration, or other signs of local infection.   Skin prepped in a sterile fashion.   Local anesthesia: Topical Ethyl chloride.   With sterile technique and under real time ultrasound guidance: Zilretta 32 mg injected into knee joint. Fluid seen entering the joint capsule.   Completed without difficulty   Advised to call if fevers/chills, erythema, induration, drainage, or persistent bleeding.   Images permanently stored and available for review in the ultrasound unit.  Impression: Technically successful ultrasound guided injection.  Lot number: [id number]

## 2024-04-10 ENCOUNTER — Ambulatory Visit: Payer: Medicare HMO | Admitting: Internal Medicine

## 2024-04-12 DIAGNOSIS — M1712 Unilateral primary osteoarthritis, left knee: Secondary | ICD-10-CM | POA: Diagnosis not present

## 2024-04-13 NOTE — Telephone Encounter (Signed)
 Patient returned my call - he is scheduled with Dr. Marven Slimmer on 5/30.

## 2024-04-29 ENCOUNTER — Other Ambulatory Visit (HOSPITAL_COMMUNITY): Payer: Self-pay | Admitting: Internal Medicine

## 2024-05-04 ENCOUNTER — Ambulatory Visit: Attending: Cardiology | Admitting: Cardiology

## 2024-05-04 ENCOUNTER — Encounter: Payer: Self-pay | Admitting: Cardiology

## 2024-05-04 VITALS — BP 93/60 | HR 86 | Ht 72.0 in | Wt 190.0 lb

## 2024-05-04 DIAGNOSIS — I5022 Chronic systolic (congestive) heart failure: Secondary | ICD-10-CM | POA: Diagnosis not present

## 2024-05-04 DIAGNOSIS — Z95810 Presence of automatic (implantable) cardiac defibrillator: Secondary | ICD-10-CM | POA: Diagnosis not present

## 2024-05-04 DIAGNOSIS — I472 Ventricular tachycardia, unspecified: Secondary | ICD-10-CM | POA: Diagnosis not present

## 2024-05-04 DIAGNOSIS — I4821 Permanent atrial fibrillation: Secondary | ICD-10-CM

## 2024-05-04 LAB — CUP PACEART INCLINIC DEVICE CHECK
Date Time Interrogation Session: 20250530143009
Implantable Lead Connection Status: 753985
Implantable Lead Connection Status: 753985
Implantable Lead Implant Date: 20010723
Implantable Lead Implant Date: 20180423
Implantable Lead Location: 753858
Implantable Lead Location: 753860
Implantable Lead Model: 6943
Implantable Pulse Generator Implant Date: 20250129

## 2024-05-04 NOTE — Progress Notes (Signed)
  Electrophysiology Office Follow up Visit Note:    Date:  05/04/2024   ID:  Carl Wood, DOB [birth year], MRN [id number]  PCP:  Almira Jaeger, MD  [facility] Cardiologist:  None  [facility] Electrophysiologist:  Boyce Byes, MD    Interval History:     Carl Wood is a 84 y.o. adult who presents for a follow up visit.   The patient was previously followed by Dr. Rodolfo Clan. He has a history of coronary artery disease, ischemic cardiomyopathy, stroke, diabetes, VT, permanent atrial fibrillation on Xarelto , subdural hematoma in 2019, sleep apnea and prior tobacco use.  He has a CRT-D in situ with a generator replacement in January 2025.  He is doing well.  He is with his wife today.  They speak very highly Dr. Rodolfo Clan.      Past medical, surgical, social and family history were reviewed.  ROS:   Please see the history of present illness.    All other systems reviewed and are negative.  EKGs/Labs/Other Studies Reviewed:    The following studies were reviewed today:  May 04, 2024 in clinic device interrogation personally reviewed Battery and lead parameter stable No programming changes made today Not dependent          Physical Exam:    VS:  There were no vitals taken for this visit.    Wt Readings from Last 3 Encounters:  03/26/24 192 lb (87.1 kg)  01/10/24 195 lb 9.6 oz (88.7 kg)  01/04/24 198 lb (89.8 kg)     GEN: no distress CARD: RRR, No MRG.  CIED pocket well-healed on the right side RESP: No IWOB. CTAB.      ASSESSMENT:    No diagnosis found. PLAN:    In order of problems listed above:  #Chronic systolic heart failure #Ischemic cardiomyopathy #CRT-D in situ Device functioning appropriately.  Continue remote monitoring. NYHA class III. Continue Coreg , digoxin , Entresto , Jardiance , Toprol   #Permanent atrial fibrillation Continue Xarelto  for stroke prophylaxis  Follow-up with EP APP in 1 year.   Signed, Harvie Liner, MD, [facility], [facility] 05/04/2024 5:27 AM    Electrophysiology [facility]

## 2024-05-04 NOTE — Patient Instructions (Signed)
 Medication Instructions:  Your physician recommends that you continue on your current medications as directed. Please refer to the Current Medication list given to you today.  *If you need a refill on your cardiac medications before your next appointment, please call your pharmacy*  Follow-Up: At [facility], you and your health needs are our priority.  As part of our continuing mission to provide you with exceptional heart care, our providers are all part of one team.  This team includes your primary Cardiologist (physician) and Advanced Practice Providers or APPs (Physician Assistants and Nurse Practitioners) who all work together to provide you with the care you need, when you need it.  Your next appointment:   1 year  Provider:   You may see Lanier Prude, MD or one of the following Advanced Practice Providers on your designated Care Team:   Francis Dowse, [place] [address]" [place], [place] Sherie Don, NP Canary Brim, NP

## 2024-05-05 ENCOUNTER — Other Ambulatory Visit (HOSPITAL_COMMUNITY): Payer: Self-pay | Admitting: Internal Medicine

## 2024-05-05 DIAGNOSIS — I4821 Permanent atrial fibrillation: Secondary | ICD-10-CM

## 2024-05-06 ENCOUNTER — Ambulatory Visit: Payer: Self-pay | Admitting: Cardiology

## 2024-05-07 NOTE — Telephone Encounter (Signed)
 Prescription refill request for Xarelto  received.  Indication: AF Last office visit: 05/04/24  Rodolph Clap MD Weight: 16.1WR Age: 83 Scr:  0.99 on 12/08/23  Epic CrCl: 72.56  Based on above findings Xarelto  20mg  daily is the appropriate dose.  Refill approved.

## 2024-05-17 NOTE — Progress Notes (Signed)
 Remote ICD transmission.

## 2024-05-24 ENCOUNTER — Telehealth: Payer: Self-pay | Admitting: *Deleted

## 2024-05-24 ENCOUNTER — Ambulatory Visit: Attending: Cardiology | Admitting: Cardiology

## 2024-05-24 ENCOUNTER — Encounter: Payer: Self-pay | Admitting: Cardiology

## 2024-05-24 VITALS — BP 126/60 | HR 93 | Ht 72.0 in | Wt 187.4 lb

## 2024-05-24 DIAGNOSIS — I10 Essential (primary) hypertension: Secondary | ICD-10-CM

## 2024-05-24 DIAGNOSIS — G4733 Obstructive sleep apnea (adult) (pediatric): Secondary | ICD-10-CM | POA: Diagnosis not present

## 2024-05-24 DIAGNOSIS — I5022 Chronic systolic (congestive) heart failure: Secondary | ICD-10-CM

## 2024-05-24 NOTE — Telephone Encounter (Signed)
 Per Dr Micael Adas, order him a new ResMed CPAP at 10cm H2O with heated humidity and mask of choice.  Upon patient request DME selection is [facility]. Patient understands he will be contacted by [facility] to set up his cpap. Patient understands to call if [facility] does not contact him with new setup in a timely manner. Patient understands they will be called once confirmation has been received from [facility]/ that they have received their new machine to schedule 10 week follow up appointment.   [facility] notified of new cpap order  Please add to airview Patient was grateful for the call and thanked me.

## 2024-05-24 NOTE — Patient Instructions (Signed)
 Medication Instructions:  Your physician recommends that you continue on your current medications as directed. Please refer to the Current Medication list given to you today.  *If you need a refill on your cardiac medications before your next appointment, please call your pharmacy*  Lab Work: None.  If you have labs (blood work) drawn today and your tests are completely normal, you will receive your results only by: MyChart Message (if you have MyChart) OR A paper copy in the mail If you have any lab test that is abnormal or we need to change your treatment, we will call you to review the results.  Testing/Procedures: None.  Follow-Up: At [facility], you and your health needs are our priority.  As part of our continuing mission to provide you with exceptional heart care, our providers are all part of one team.  This team includes your primary Cardiologist (physician) and Advanced Practice Providers or APPs (Physician Assistants and Nurse Practitioners) who all work together to provide you with the care you need, when you need it.  Your next appointment:   1 year(s)  Provider:   Dr. Gaylyn Keas, MD   We recommend signing up for the patient portal called MyChart.  Sign up information is provided on this After Visit Summary.  MyChart is used to connect with patients for Virtual Visits (Telemedicine).  Patients are able to view lab/test results, encounter notes, upcoming appointments, etc.  Non-urgent messages can be sent to your provider as well.   To learn more about what you can do with MyChart, go to [email or web address].   Other Instructions Please notify our office when you receive your new cpap device so we can schedule you a follow up visit.

## 2024-05-24 NOTE — Progress Notes (Unsigned)
 Cardiology Office Note  ID:  Carl Wood, Carl Wood 11-09-1941, MRN [id number]  PCP:  Almira Jaeger, MD  Cardiologist:  Jules Oar, MD Sleep Medicine:  Gaylyn Keas, MD Electrophysiologist:  Boyce Byes, MD   Chief Complaint:  OSA  History of Present Illness:    Carl Wood is a 83 y.o. adult with a hx of moderate to severe OSA with an AHI of 25.2/hr and is on CPAP at 8cm H2O.    I have not seen him in over a year and a half and currently has not been using his CPAP for the past 2-3 months.  He says that when he cuts it on it makes a very loud sound and he cannot sleep due to the noise.  He stopped using it until he could see me.  His device is almost 81 years old.   When he is not using it he feels sleepy when he wakes up and then feels sleepy all day.  He does not go to bed until 3am and then sleeps half the day.   Prior CV studies:   The following studies were reviewed today:  PAP compliance download  Past Medical History:  Diagnosis Date   CAD (coronary artery disease)    CHF (congestive heart failure) (HCC)    Chronic atrial fibrillation (HCC)    Colon polyps    Diabetes mellitus    Diverticulosis of colon    DIVERTICULOSIS, COLON 10/16/2006   Qualifier: Diagnosis of  By: Penney Bowling MD, Bruce     Excessive daytime sleepiness 02/19/2016   Hyperlipidemia    Hypertension    MI (mitral incompetence)    Nephrolithiasis    NEPHROLITHIASIS 06/26/2008   Qualifier: Diagnosis of  By: Derick Fleeting, CMA, Cindy     OSA (obstructive sleep apnea) 02/19/2016   Moderate to severe OSA with an AHI of 25/hr and on CPAP at 8cm H2O   PE (pulmonary embolism)    V-[facility])    Past Surgical History:  Procedure Laterality Date   BACK SURGERY     BASAL CELL CARCINOMA EXCISION     nose   BIV UPGRADE N/A 03/28/2017   Procedure: BiV Upgrade;  Surgeon: Verona Goodwill, MD;  Location: [facility] INVASIVE CV LAB;  Service: Cardiovascular;  Laterality: N/A;   CARDIAC CATHETERIZATION N/A  01/20/2016   Procedure: Right/Left Heart Cath and Coronary Angiography;  Surgeon: Mardell Shade, MD;  Location: [facility] INVASIVE CV LAB;  Service: Cardiovascular;  Laterality: N/A;   CARDIAC DEFIBRILLATOR PLACEMENT     medtronic virtuoso   CHOLECYSTECTOMY     COLONOSCOPY  12/11/2012   Procedure: COLONOSCOPY;  Surgeon: Claudette Cue, MD;  Location: [facility] ENDOSCOPY;  Service: Endoscopy;  Laterality: N/A;   ICD GENERATOR CHANGEOUT N/A 01/04/2024   Procedure: ICD GENERATOR CHANGEOUT;  Surgeon: Verona Goodwill, MD;  Location: [facility] INVASIVE CV LAB;  Service: Cardiovascular;  Laterality: N/A;   KNEE ARTHROPLASTY       Current Meds  Medication Sig   ascorbic acid (VITAMIN C) 500 MG tablet Take 500 mg by mouth daily.   azithromycin (ZITHROMAX) 250 MG tablet Take by mouth daily.   blood glucose meter kit and supplies KIT Dispense based on patient and insurance preference. Use up to four times daily as directed.   cetirizine (ZYRTEC) 10 MG tablet Take 10 mg by mouth daily.   digoxin  (LANOXIN ) 0.25 MG tablet Take 0.5 tablets (0.125 mg total) by mouth daily.   empagliflozin  (JARDIANCE ) 10  MG TABS tablet Take 1 tablet (10 mg total) by mouth daily.   ezetimibe  (ZETIA ) 10 MG tablet Take 1 tablet (10 mg total) by mouth daily.   furosemide  (LASIX ) 20 MG tablet Take 1 tablet by mouth once a week   glimepiride  (AMARYL ) 4 MG tablet TAKE 2 TABLETS BY MOUTH ONCE DAILY WITH BREAKFAST   glucose blood (ACCU-CHEK GUIDE) test strip USE UP TO FOUR TIMES DAILY Dx: E11.9   metFORMIN  (GLUCOPHAGE -XR) 500 MG 24 hr tablet TAKE 2 TABLETS BY MOUTH IN THE MORNING AND 2 AT BEDTIME   metoprolol  succinate (TOPROL -XL) 25 MG 24 hr tablet TAKE 1 TABLET BY MOUTH AT BEDTIME   predniSONE (DELTASONE) 5 MG tablet Take 5 mg by mouth daily with breakfast.   rivaroxaban  (XARELTO ) 20 MG TABS tablet Take 1 tablet by mouth once daily   rosuvastatin  (CRESTOR ) 40 MG tablet Take 1 tablet by mouth once daily   sacubitril -valsartan  (ENTRESTO ) 24-26 MG  Take 1 tablet by mouth twice daily   TURMERIC PO Take 1,400 mg by mouth 2 (two) times daily.     Allergies:   Penicillins and Sulfamethoxazole   Social History   Tobacco Use   Smoking status: Former    Current packs/day: 0.00    Average packs/day: 1 pack/day for 50.0 years (50.0 ttl pk-yrs)    Types: Cigarettes    Start date: 12/15/1962    Quit date: 12/15/2012    Years since quitting: 11.4   Smokeless tobacco: Never  Vaping Use   Vaping status: Never Used  Substance Use Topics   Alcohol use: No    Alcohol/week: 0.0 standard drinks of alcohol   Drug use: No     Family Hx: The patient's family history includes Bladder Cancer in his mother; Colon cancer in his father; Diabetes in his father; Heart attack (age of onset: 37) in his child; Heart attack (age of onset: 47) in his child; Heart disease in his father and mother.  ROS:   Please see the history of present illness.     All other systems reviewed and are negative.   Labs/Other Tests and Data Reviewed:    Recent Labs: 07/25/2023: B Natriuretic Peptide 77.2 12/08/2023: ALT 17; BUN 25; Creatinine, Ser 0.99; Hemoglobin 16.1; Platelets 168.0; Potassium 4.4; Sodium 140   Recent Lipid Panel Lab Results  Component Value Date/Time   CHOL 138 06/07/2023 10:38 AM   TRIG 74.0 06/07/2023 10:38 AM   TRIG 53 10/10/2006 11:13 AM   HDL 46.10 06/07/2023 10:38 AM   CHOLHDL 3 06/07/2023 10:38 AM   LDLCALC 78 06/07/2023 10:38 AM   LDLCALC 76 07/03/2020 02:17 PM   LDLDIRECT 81.0 12/08/2023 03:29 PM    Wt Readings from Last 3 Encounters:  05/24/24 187 lb 6.4 oz (85 kg)  05/04/24 190 lb (86.2 kg)  03/26/24 192 lb (87.1 kg)     Objective:    Vital Signs:  BP 126/60   Pulse 93   Ht 6' (1.829 m)   Wt 187 lb 6.4 oz (85 kg)   SpO2 94%   BMI 25.42 kg/m   GEN: Well nourished, well developed in no acute distress HEENT: Normal NECK: No JVD; No carotid bruits LYMPHATICS: No lymphadenopathy CARDIAC:RRR, no murmurs, rubs,  gallops RESPIRATORY:  Clear to auscultation without rales, wheezing or rhonchi  ABDOMEN: Soft, non-tender, non-distended MUSCULOSKELETAL:  No edema; No deformity  SKIN: Warm and dry NEUROLOGIC:  Alert and oriented x 3 PSYCHIATRIC:  Normal affect   ASSESSMENT & PLAN:    #  OSA  -he has not been using his CPAP recently because it is making loud noises at night and cannot sleep -His device is over 57 years old -I will order him a new CPAP device and then see him back in 6 weeks after he gets his new device -I will order him a new ResMed CPAP at 10cm H2O with heated humidity and mask of choice  #HTN - BP is controlled on exam today - continue Toprol -XL 25 mg daily, Entresto  24-26 mg twice daily with as needed refills  Medication Adjustments/Labs and Tests Ordered: Current medicines are reviewed at length with the patient today.  Concerns regarding medicines are outlined above.  Tests Ordered: No orders of the defined types were placed in this encounter.  Medication Changes: No orders of the defined types were placed in this encounter.   Disposition:  Follow up in 1 year(s)  Signed, Gaylyn Keas, MD  05/24/2024 9:38 AM    [facility]

## 2024-06-01 ENCOUNTER — Ambulatory Visit (HOSPITAL_COMMUNITY)
Admission: RE | Admit: 2024-06-01 | Discharge: 2024-06-01 | Disposition: A | Discharge status: Home / Self Care | Source: Ambulatory Visit | Attending: Internal Medicine | Admitting: Internal Medicine

## 2024-06-01 VITALS — BP 106/64 | HR 78 | Wt 194.8 lb

## 2024-06-01 DIAGNOSIS — I4821 Permanent atrial fibrillation: Secondary | ICD-10-CM | POA: Diagnosis not present

## 2024-06-01 DIAGNOSIS — I5022 Chronic systolic (congestive) heart failure: Secondary | ICD-10-CM | POA: Diagnosis not present

## 2024-06-01 DIAGNOSIS — Z95810 Presence of automatic (implantable) cardiac defibrillator: Secondary | ICD-10-CM | POA: Diagnosis not present

## 2024-06-01 DIAGNOSIS — Z7984 Long term (current) use of oral hypoglycemic drugs: Secondary | ICD-10-CM | POA: Diagnosis not present

## 2024-06-01 DIAGNOSIS — I472 Ventricular tachycardia, unspecified: Secondary | ICD-10-CM | POA: Insufficient documentation

## 2024-06-01 DIAGNOSIS — I2510 Atherosclerotic heart disease of native coronary artery without angina pectoris: Secondary | ICD-10-CM | POA: Insufficient documentation

## 2024-06-01 DIAGNOSIS — I252 Old myocardial infarction: Secondary | ICD-10-CM | POA: Insufficient documentation

## 2024-06-01 DIAGNOSIS — E119 Type 2 diabetes mellitus without complications: Secondary | ICD-10-CM | POA: Diagnosis not present

## 2024-06-01 DIAGNOSIS — G4733 Obstructive sleep apnea (adult) (pediatric): Secondary | ICD-10-CM | POA: Diagnosis not present

## 2024-06-01 DIAGNOSIS — Z8673 Personal history of transient ischemic attack (TIA), and cerebral infarction without residual deficits: Secondary | ICD-10-CM | POA: Insufficient documentation

## 2024-06-01 DIAGNOSIS — I428 Other cardiomyopathies: Secondary | ICD-10-CM | POA: Diagnosis not present

## 2024-06-01 DIAGNOSIS — Z7901 Long term (current) use of anticoagulants: Secondary | ICD-10-CM | POA: Insufficient documentation

## 2024-06-01 DIAGNOSIS — Z87891 Personal history of nicotine dependence: Secondary | ICD-10-CM | POA: Insufficient documentation

## 2024-06-01 LAB — BASIC METABOLIC PANEL WITH GFR
Anion gap: 15 (ref 5–15)
BUN: 13 mg/dL (ref 8–23)
CO2: 22 mmol/L (ref 22–32)
Calcium: 9.5 mg/dL (ref 8.9–10.3)
Chloride: 103 mmol/L (ref 98–111)
Creatinine, Ser: 0.83 mg/dL (ref 0.61–1.24)
GFR, Estimated: >60 mL/min (ref >60)
Glucose, Bld: 225 mg/dL — ABNORMAL HIGH (ref 70–99)
Potassium: 4.5 mmol/L (ref 3.5–5.1)
Sodium: 140 mmol/L (ref 135–145)

## 2024-06-01 LAB — BRAIN NATRIURETIC PEPTIDE: B Natriuretic Peptide: 145.7 pg/mL — ABNORMAL HIGH (ref 0.0–100.0)

## 2024-06-01 NOTE — Patient Instructions (Signed)
 Labs done today. We will contact you only if your labs are abnormal.  No medication changes were made. Please continue all current medications as prescribed.  Your physician recommends that you schedule a follow-up appointment in: 6 months with an echo prior to your appointment with Dr. Bensimhon. Please contact our office in October to schedule a December appointment.   Your physician has requested that you have an echocardiogram. Echocardiography is a painless test that uses sound waves to create images of your heart. It provides your doctor with information about the size and shape of your heart and how well your heart's chambers and valves are working. This procedure takes approximately one hour. There are no restrictions for this procedure. Please do NOT wear cologne, perfume, aftershave, or lotions (deodorant is allowed). Please arrive 15 minutes prior to your appointment time.  Please note: We ask at that you not bring children with you during ultrasound (echo/ vascular) testing. Due to room size and safety concerns, children are not allowed in the ultrasound rooms during exams. Our front office staff cannot provide observation of children in our lobby area while testing is being conducted. An adult accompanying a patient to their appointment will only be allowed in the ultrasound room at the discretion of the ultrasound technician under special circumstances. We apologize for any inconvenience.  If you have any questions or concerns before your next appointment please send us  a message through [place] or call our office at [phone].    TO LEAVE A MESSAGE FOR THE NURSE SELECT OPTION 2, PLEASE LEAVE A MESSAGE INCLUDING: YOUR NAME DATE OF BIRTH CALL BACK NUMBER REASON FOR CALL**this is important as we prioritize the call backs  YOU WILL RECEIVE A CALL BACK THE SAME DAY AS LONG AS YOU CALL BEFORE 4:00 PM   Do the following things EVERYDAY: Weigh yourself in the morning before  breakfast. Write it down and keep it in a log. Take your medicines as prescribed Eat low salt foods--Limit salt (sodium) to 2000 mg per day.  Stay as active as you can everyday Limit all fluids for the day to less than 2 liters   At the [facility], you and your health needs are our priority. As part of our continuing mission to provide you with exceptional heart care, we have created designated Provider Care Teams. These Care Teams include your primary Cardiologist (physician) and Advanced Practice Providers (APPs- Physician Assistants and Nurse Practitioners) who all work together to provide you with the care you need, when you need it.   You may see any of the following providers on your designated Care Team at your next follow up: Dr Toribio Fuel Dr Ezra Shuck Dr. Ria Gardenia Greig Lenetta, NP Caffie Shed, [place] [facility] [place], [place] Beckey Coe, NP Tinnie Redman, PharmD   Please be sure to bring in all your medications bottles to every appointment.    Thank you for choosing [facility]

## 2024-06-01 NOTE — Progress Notes (Signed)
 [facility] Note    Date:  06/01/2024   ID:  Carl Wood, DOB [birth year], MRN [id number]  Location: Home  Provider location: [address] [place] Type of Visit: Established patient PCP:  Katrinka Garnette KIDD, MD  Cardiologist:Dr Turner  Primary HF: Dr Cherrie  EP: Dr Fernande   History of Present Illness:  [name] is an 83 y/o male with CAD s/p MI 1983, ICM with chronic systolic heart failure s/p Medtronic single chamber ICD (EF 20%) CVA 2014, DM2 VT, permanent A fib on Xarelto , SDH 2019, OSA, and former smoker.   Had CRT-D upgrade in April 2018.    In 12/18 saw Dr. Skeet for chronic HA. Found to have small SDHs that were felt to be chronic after a previous fall when trimming vines. Xarelto  stopped. F/u C 2/19 SDHs resolved. Xarelto  restarted in 2/19  Echo 1/21   EF 20-25%  Echo 01/13/22 EF 25-30% RV ok   ABIs 2017 No PAD  Echo  07/25/23 limited windows. EF ~25-30%   Here with his wife for f/u. Feels pretty good. Struggling with orthopedic issues. Denies CP or SOB. + edema in left foot. Taking BP every morning SBP high 80s-low100s. Mostly 90s   ICD interrogation: Volume up slightly. No VT Activity 1.2h/day Personally reviewed   Past Medical History:  Diagnosis Date   CAD (coronary artery disease)    CHF (congestive heart failure) (HCC)    Chronic atrial fibrillation (HCC)    Colon polyps    Diabetes mellitus    Diverticulosis of colon    DIVERTICULOSIS, COLON 10/16/2006   Qualifier: Diagnosis of  By: Tammie MD, Bruce     Excessive daytime sleepiness 02/19/2016   Hyperlipidemia    Hypertension    MI (mitral incompetence)    Nephrolithiasis    NEPHROLITHIASIS 06/26/2008   Qualifier: Diagnosis of  By: Delos, CMA, Cindy     OSA (obstructive sleep apnea) 02/19/2016   Moderate to severe OSA with an AHI of 25/hr and on CPAP at 8cm H2O   PE (pulmonary embolism)    V-tach [facility])    Past Surgical History:  Procedure Laterality Date    BACK SURGERY     BASAL CELL CARCINOMA EXCISION     nose   BIV UPGRADE N/A 03/28/2017   Procedure: BiV Upgrade;  Surgeon: Elspeth JAYSON Fernande, MD;  Location: [facility] INVASIVE CV LAB;  Service: Cardiovascular;  Laterality: N/A;   CARDIAC CATHETERIZATION N/A 01/20/2016   Procedure: Right/Left Heart Cath and Coronary Angiography;  Surgeon: Toribio JONELLE Cherrie, MD;  Location: [facility] INVASIVE CV LAB;  Service: Cardiovascular;  Laterality: N/A;   CARDIAC DEFIBRILLATOR PLACEMENT     medtronic virtuoso   CHOLECYSTECTOMY     COLONOSCOPY  12/11/2012   Procedure: COLONOSCOPY;  Surgeon: Lamar JONETTA Aho, MD;  Location: [facility];  Service: Endoscopy;  Laterality: N/A;   ICD GENERATOR CHANGEOUT N/A 01/04/2024   Procedure: ICD GENERATOR CHANGEOUT;  Surgeon: Fernande Elspeth JAYSON, MD;  Location: [facility] INVASIVE CV LAB;  Service: Cardiovascular;  Laterality: N/A;   KNEE ARTHROPLASTY       Current Outpatient Medications  Medication Sig Dispense Refill   ascorbic acid (VITAMIN C) 500 MG tablet Take 500 mg by mouth daily.     blood glucose meter kit and supplies KIT Dispense based on patient and insurance preference. Use up to four times daily as directed. 1 each 0   cetirizine (ZYRTEC) 10 MG tablet Take 10 mg  by mouth daily.     digoxin  (LANOXIN ) 0.25 MG tablet Take 0.5 tablets (0.125 mg total) by mouth daily. 45 tablet 3   empagliflozin  (JARDIANCE ) 10 MG TABS tablet Take 1 tablet (10 mg total) by mouth daily. 90 tablet 3   ezetimibe  (ZETIA ) 10 MG tablet Take 1 tablet (10 mg total) by mouth daily. 90 tablet 3   furosemide  (LASIX ) 20 MG tablet Take 1 tablet by mouth once a week 12 tablet 0   glimepiride  (AMARYL ) 4 MG tablet TAKE 2 TABLETS BY MOUTH ONCE DAILY WITH BREAKFAST 180 tablet 0   glucose blood (ACCU-CHEK GUIDE) test strip USE UP TO FOUR TIMES DAILY Dx: E11.9 450 each 3   metFORMIN  (GLUCOPHAGE -XR) 500 MG 24 hr tablet TAKE 2 TABLETS BY MOUTH IN THE MORNING AND 2 AT BEDTIME 360 tablet 0   metoprolol  succinate (TOPROL -XL) 25  MG 24 hr tablet TAKE 1 TABLET BY MOUTH AT BEDTIME 90 tablet 0   predniSONE (DELTASONE) 5 MG tablet Take 5 mg by mouth daily with breakfast.     rivaroxaban  (XARELTO ) 20 MG TABS tablet Take 1 tablet by mouth once daily 90 tablet 1   rosuvastatin  (CRESTOR ) 40 MG tablet Take 1 tablet by mouth once daily 90 tablet 0   sacubitril -valsartan  (ENTRESTO ) 24-26 MG Take 1 tablet by mouth twice daily 180 tablet 2   TURMERIC PO Take 1,400 mg by mouth 2 (two) times daily.     No current facility-administered medications for this encounter.    Allergies:   Penicillins and Sulfamethoxazole   Social History:  The patient  reports that he quit smoking about 11 years ago. His smoking use included cigarettes. He started smoking about 61 years ago. He has a 50 pack-year smoking history. He has never used smokeless tobacco. He reports that he does not drink alcohol and does not use drugs.   Family History:  The patient's family history includes Bladder Cancer in his mother; Colon cancer in his father; Diabetes in his father; Heart attack (age of onset: 49) in his child; Heart attack (age of onset: 46) in his child; Heart disease in his father and mother.   ROS:  Please see the history of present illness.   All other systems are personally reviewed and negative.   Vitals:   06/01/24 1201  BP: 106/64  Pulse: 78  SpO2: 94%   Wt Readings from Last 3 Encounters:  06/01/24 88.4 kg (194 lb 12.8 oz)  05/24/24 85 kg (187 lb 6.4 oz)  05/04/24 86.2 kg (190 lb)    Exam:  General:  Elderly No resp difficulty HEENT: normal Neck: supple. no JVD. Carotids 2+ bilat; no bruits. No lymphadenopathy or thryomegaly appreciated. Cor: PMI nondisplaced. Irregular rate & rhythm. No rubs, gallops or murmurs. Lungs: clear Abdomen: soft, nontender, nondistended. No hepatosplenomegaly. No bruits or masses. Good bowel sounds. Extremities: no cyanosis, clubbing, rash, edema  Left knee brace Neuro: alert & orientedx3, cranial nerves  grossly intact. moves all 4 extremities w/o difficulty. Affect pleasant  ECG n/a   Recent Labs: 07/25/2023: B Natriuretic Peptide 77.2 12/08/2023: ALT 17; BUN 25; Creatinine, Ser 0.99; Hemoglobin 16.1; Platelets 168.0; Potassium 4.4; Sodium 140   Wt Readings from Last 3 Encounters:  06/01/24 88.4 kg (194 lb 12.8 oz)  05/24/24 85 kg (187 lb 6.4 oz)  05/04/24 86.2 kg (190 lb)    ASSESSMENT AND PLAN:  1. Chronic Systolic HF: ICM, s/p Medtronic Bi-V ICD (upgrade 4/18). Echo 09/2016 EF 25-30%. ECHO 8/19 EF  20% - Echo 12/12/19 EF 20-25% RV ok  - Stable NYHA III - Volume up on ICD. Currently taking lasix  1x/week can take extra as needed. We discussed this.  - BP continues to run low but asymptoamtic - Echo  01/13/22 EF stable 25-30% RV ok - Echo 07/25/23 EF 25-30%  - Continue Entresto  12/13 bid BP runs low but tolerable so will continue for now  - Continue Jardiance  10  - Continue Toprol  25 at bedtime - Continue digoxin  0.125 - No spiro with history of hyperkalemia and soft BP.  - s/p ICD generator change 1/25. Saw Dr. Cindie on 05/04/24. Doing well (note reviewed personally) - ICD interrogated personally as above  - Labs today   2. OSA - recently saw Dr. Shlomo and wasn't using device so new device ordered   3. Chronic A fib - Rate controlled. - Xarelto  restarted after resolution of SDH.  - No recent bleeding   4. CAD: - Has known distal LAD lesion 95% and mid LAD 40%. No benefit to revascularization given wall motion abnormality.  - No s/s angina - Continue statin. Off ASA with Xarelto   5. NSVT  - stable - continue b-blocker - No VT on ICD today  Signed, Toribio Fuel, MD  12:24 PM  [facility] [address] [facility] [place] [address] [phone] (office) [phone] (fax)

## 2024-06-04 ENCOUNTER — Other Ambulatory Visit: Payer: Self-pay | Admitting: Internal Medicine

## 2024-06-05 DIAGNOSIS — H52223 Regular astigmatism, bilateral: Secondary | ICD-10-CM | POA: Diagnosis not present

## 2024-06-05 DIAGNOSIS — H524 Presbyopia: Secondary | ICD-10-CM | POA: Diagnosis not present

## 2024-06-05 DIAGNOSIS — E119 Type 2 diabetes mellitus without complications: Secondary | ICD-10-CM | POA: Diagnosis not present

## 2024-06-05 DIAGNOSIS — H25813 Combined forms of age-related cataract, bilateral: Secondary | ICD-10-CM | POA: Diagnosis not present

## 2024-06-05 DIAGNOSIS — Z7984 Long term (current) use of oral hypoglycemic drugs: Secondary | ICD-10-CM | POA: Diagnosis not present

## 2024-06-05 NOTE — Telephone Encounter (Signed)
 This Pt is a Pt of Dr. Fernande. However, Dr. Cindie last saw this Pt in May 2025. Does Dr. Cindie want to refill this RX?

## 2024-06-07 ENCOUNTER — Ambulatory Visit: Payer: Medicare HMO | Admitting: Family Medicine

## 2024-06-07 ENCOUNTER — Encounter: Payer: Self-pay | Admitting: Family Medicine

## 2024-06-07 VITALS — BP 112/72 | HR 95 | Temp 97.5°F | Ht 72.0 in | Wt 193.6 lb

## 2024-06-07 DIAGNOSIS — Z0000 Encounter for general adult medical examination without abnormal findings: Secondary | ICD-10-CM | POA: Diagnosis not present

## 2024-06-07 DIAGNOSIS — E1169 Type 2 diabetes mellitus with other specified complication: Secondary | ICD-10-CM

## 2024-06-07 DIAGNOSIS — E785 Hyperlipidemia, unspecified: Secondary | ICD-10-CM

## 2024-06-07 DIAGNOSIS — I482 Chronic atrial fibrillation, unspecified: Secondary | ICD-10-CM | POA: Diagnosis not present

## 2024-06-07 DIAGNOSIS — I2782 Chronic pulmonary embolism: Secondary | ICD-10-CM

## 2024-06-07 DIAGNOSIS — I5022 Chronic systolic (congestive) heart failure: Secondary | ICD-10-CM

## 2024-06-07 DIAGNOSIS — E119 Type 2 diabetes mellitus without complications: Secondary | ICD-10-CM

## 2024-06-07 DIAGNOSIS — I2510 Atherosclerotic heart disease of native coronary artery without angina pectoris: Secondary | ICD-10-CM | POA: Diagnosis not present

## 2024-06-07 DIAGNOSIS — I10 Essential (primary) hypertension: Secondary | ICD-10-CM | POA: Diagnosis not present

## 2024-06-07 DIAGNOSIS — Z87891 Personal history of nicotine dependence: Secondary | ICD-10-CM | POA: Diagnosis not present

## 2024-06-07 DIAGNOSIS — I4821 Permanent atrial fibrillation: Secondary | ICD-10-CM | POA: Diagnosis not present

## 2024-06-07 DIAGNOSIS — Z7984 Long term (current) use of oral hypoglycemic drugs: Secondary | ICD-10-CM

## 2024-06-07 DIAGNOSIS — R911 Solitary pulmonary nodule: Secondary | ICD-10-CM

## 2024-06-07 NOTE — Patient Instructions (Signed)
 Try to give us  the name of your eye doctor so we can request records  Schedule a lab visit at the check out desk within 2 weeks. Return for future fasting labs meaning nothing but water after midnight please. Ok to take your medications with water.   We will call you within two weeks about your referral for CT chest (since you don't qualify for lung cancer screening now)  through [facility].  Their phone number is [phone].  Please call them if you have not heard in 1-2 weeks  Recommended follow up: Return in about 6 months (around 12/08/2024) for followup or sooner if needed.Schedule b4 you leave. As long as a1c stays below 8

## 2024-06-07 NOTE — Progress Notes (Signed)
 Phone: [phone]   Subjective:  Patient presents today for their annual physical. Chief complaint-noted.   See problem oriented charting- ROS- full  review of systems was completed and negative  Per full ROS sheet completed by patient except for topics noted under acute/chronic concerns  The following were reviewed and entered/updated in epic: Past Medical History:  Diagnosis Date   CAD (coronary artery disease)    CHF (congestive heart failure) (HCC)    Chronic atrial fibrillation (HCC)    Colon polyps    Diabetes mellitus    Diverticulosis of colon    DIVERTICULOSIS, COLON 10/16/2006   Qualifier: Diagnosis of  By: Tammie MD, Bruce     Excessive daytime sleepiness 02/19/2016   Hyperlipidemia    Hypertension    MI (mitral incompetence)    Nephrolithiasis    NEPHROLITHIASIS 06/26/2008   Qualifier: Diagnosis of  By: Delos, CMA, Cindy     OSA (obstructive sleep apnea) 02/19/2016   Moderate to severe OSA with an AHI of 25/hr and on CPAP at 8cm H2O   PE (pulmonary embolism)    V-tach [facility])    Patient Active Problem List   Diagnosis Date Noted   Permanent atrial fibrillation (HCC) 05/10/2023    Priority: High   COPD (chronic obstructive pulmonary disease) (HCC) 12/26/2018    Priority: High   History of subdural hematoma 11/21/2017    Priority: High   Chronic systolic heart failure (HCC) 08/06/2015    Priority: High   Former smoker 11/15/2014    Priority: High   History of cardioembolic cerebrovascular accident (CVA) 12/19/2012    Priority: High    ventricular tachycardia-non sustained  02/17/2012    Priority: High   Diabetes mellitus type II, controlled (HCC) 10/16/2006    Priority: High   CAD (coronary artery disease) 10/16/2006    Priority: High   Chronic pulmonary embolism (HCC) 10/16/2006    Priority: High   Dyspnea on exertion 08/22/2017    Priority: Medium    OSA (obstructive sleep apnea) 02/19/2016    Priority: Medium    Carotid artery stenosis 03/17/2015     Priority: Medium    Implantable cardioverter-defibrillator (ICD) in situ 02/17/2012    Priority: Medium    History of skin cancer 07/05/2007    Priority: Medium    Hyperlipidemia associated with type 2 diabetes mellitus (HCC) 10/16/2006    Priority: Medium    Essential hypertension 10/16/2006    Priority: Medium    Osteoarthritis of right knee 09/22/2018    Priority: Low   Frontal headache 10/06/2017    Priority: Low   Rhinitis 08/22/2017    Priority: Low   LBBB (left bundle branch block) 08/06/2015    Priority: Low   Actinic keratosis 11/15/2014    Priority: Low   Benign neoplasm of colon 12/11/2012    Priority: Low   IVCD (intraventricular conduction defect) 05/10/2023   Ischemic cardiomyopathy 03/28/2017   Past Surgical History:  Procedure Laterality Date   BACK SURGERY     BASAL CELL CARCINOMA EXCISION     nose   BIV UPGRADE N/A 03/28/2017   Procedure: BiV Upgrade;  Surgeon: Elspeth JAYSON Sage, MD;  Location: [facility] INVASIVE CV LAB;  Service: Cardiovascular;  Laterality: N/A;   CARDIAC CATHETERIZATION N/A 01/20/2016   Procedure: Right/Left Heart Cath and Coronary Angiography;  Surgeon: Toribio JONELLE Fuel, MD;  Location: [facility] INVASIVE CV LAB;  Service: Cardiovascular;  Laterality: N/A;   CARDIAC DEFIBRILLATOR PLACEMENT     medtronic virtuoso  CHOLECYSTECTOMY     COLONOSCOPY  12/11/2012   Procedure: COLONOSCOPY;  Surgeon: Lamar JONETTA Aho, MD;  Location: [facility] ENDOSCOPY;  Service: Endoscopy;  Laterality: N/A;   ICD GENERATOR CHANGEOUT N/A 01/04/2024   Procedure: ICD GENERATOR CHANGEOUT;  Surgeon: Fernande Elspeth BROCKS, MD;  Location: [facility] INVASIVE CV LAB;  Service: Cardiovascular;  Laterality: N/A;   KNEE ARTHROPLASTY      Family History  Problem Relation Age of Onset   Bladder Cancer Mother    Heart disease Mother    Colon cancer Father    Diabetes Father    Heart disease Father    Heart attack Child 60   Heart attack Child 44    Medications- reviewed and updated Current  Outpatient Medications  Medication Sig Dispense Refill   ascorbic acid (VITAMIN C) 500 MG tablet Take 500 mg by mouth daily.     blood glucose meter kit and supplies KIT Dispense based on patient and insurance preference. Use up to four times daily as directed. 1 each 0   cetirizine (ZYRTEC) 10 MG tablet Take 10 mg by mouth daily.     digoxin  (LANOXIN ) 0.25 MG tablet Take 1/2 (one-half) tablet by mouth once daily 45 tablet 0   empagliflozin  (JARDIANCE ) 10 MG TABS tablet Take 1 tablet (10 mg total) by mouth daily. 90 tablet 3   ezetimibe  (ZETIA ) 10 MG tablet Take 1 tablet (10 mg total) by mouth daily. 90 tablet 3   furosemide  (LASIX ) 20 MG tablet Take 1 tablet by mouth once a week 12 tablet 0   glimepiride  (AMARYL ) 4 MG tablet TAKE 2 TABLETS BY MOUTH ONCE DAILY WITH BREAKFAST 180 tablet 0   glucose blood (ACCU-CHEK GUIDE) test strip USE UP TO FOUR TIMES DAILY Dx: E11.9 450 each 3   metFORMIN  (GLUCOPHAGE -XR) 500 MG 24 hr tablet TAKE 2 TABLETS BY MOUTH IN THE MORNING AND 2 AT BEDTIME 360 tablet 0   metoprolol  succinate (TOPROL -XL) 25 MG 24 hr tablet TAKE 1 TABLET BY MOUTH AT BEDTIME 90 tablet 0   predniSONE (DELTASONE) 5 MG tablet Take 5 mg by mouth daily with breakfast.     rivaroxaban  (XARELTO ) 20 MG TABS tablet Take 1 tablet by mouth once daily 90 tablet 1   rosuvastatin  (CRESTOR ) 40 MG tablet Take 1 tablet by mouth once daily 90 tablet 0   sacubitril -valsartan  (ENTRESTO ) 24-26 MG Take 1 tablet by mouth twice daily 180 tablet 2   TURMERIC PO Take 1,400 mg by mouth 2 (two) times daily.     No current facility-administered medications for this visit.    Allergies-reviewed and updated Allergies  Allergen Reactions   Penicillins Other (See Comments)    Patient passed out   Sulfamethoxazole Rash    Social History   Social History Narrative   Married. 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 Oropharynx normal, tympanic membrane normal bilaterally, nasal turbinates normal other than some dried blood in left nostril  Neck:  no thyromegaly CV: irregularly irregular  no murmurs rubs or gallops Lungs: CTAB no crackles, wheeze, rhonchi Abdomen: soft/nontender/nondistended/normal bowel sounds. No rebound or guarding.  Ext: no edema on right, trace on left per baseline Skin: warm, dry Neuro: grossly normal, moves all extremities, PERRLA Left knee brace in place donjoy   Assessment and Plan  83 y.o. adult presenting for annual physical.  Health Maintenance counseling: 1. Anticipatory guidance: Patient counseled regarding regular dental exams -q6 months, eye exams -yearly,  avoiding smoking and second hand smoke , limiting alcohol to 2 beverages per day - doesn't drink, no illicit drugs .   2. Risk factor reduction:  Advised patient of need for regular exercise and diet rich and fruits and vegetables to reduce risk of heart attack and stroke.  Exercise- trying to stay relatively active but limited by knee and back lately.  Diet/weight management-weight within 3 lbs of last year- reasonably healthy but not perfect.  Wt Readings from Last 3 Encounters:  06/07/24 193 lb 9.6 oz (87.8 kg)  06/01/24 194 lb 12.8 oz (88.4 kg)  05/24/24 187 lb 6.4 oz (85 kg)   3. Immunizations/screenings/ancillary studies- up to date  Immunization History  Administered Date(s) Administered   DTaP 09/01/2011   Fluad Quad(high Dose 65+) 08/16/2019, 10/16/2020, 09/01/2021, 09/10/2022   Fluad Trivalent(High Dose 65+) 09/15/2023   Influenza Split 09/08/2011   Influenza  Whole 08/23/2008, 09/10/2009, 09/04/2010   Influenza, High Dose Seasonal PF 09/07/2013, 09/17/2014, 09/10/2016, 08/11/2017, 09/04/2018   Influenza-Unspecified 09/03/2015   PFIZER Comirnaty(Gray Top)Covid-19 Tri-Sucrose Vaccine 09/09/2022   PFIZER(Purple Top)SARS-COV-2 Vaccination 12/21/2019, 02/08/2020, 09/30/2020, 03/10/2021   PNEUMOCOCCAL CONJUGATE-20 03/02/2021   Pfizer Covid-19 Vaccine Bivalent Booster 16yrs & up 11/02/2021   Pfizer(Comirnaty)Fall Seasonal Vaccine 12 years and older 09/15/2023   Pneumococcal Conjugate-13 11/15/2014   Pneumococcal Polysaccharide-23 09/08/2011   Respiratory Syncytial Virus Vaccine,Recomb Aduvanted(Arexvy) 09/09/2022, 09/15/2023   Tdap 11/15/2014   Zoster Recombinant(Shingrix) 05/09/2018, 08/08/2018   Zoster, Live 02/06/2008  4. Prostate cancer screening- past age based screening recommendations , prior low risk trend. Nocturia on jardiance   Lab Results  Component Value Date   PSA 0.85 10/27/2012   PSA 1.08 12/10/2008   PSA 1.41 02/06/2007   5. Colon cancer screening - past age based screening recommendations for colon cancer screening especially in light of other medical illness - has seen Dr. Legrand in past. No blood in stool.  6. Skin cancer screening- [facility] Dr. Elnor. advised regular sunscreen use. Denies worrisome, changing, or new skin lesions.  7. Smoking associated screening (lung cancer screening, AAA screen 65-75, UA)- former smoker- prior lung cancer screening program until aged out 8. STD screening - only active with wife  Status of chronic or acute concerns   #MSk- working with Dr. Corey- MRI scheduled [place] next Thursday for back and hip though  #Chronic systolic heart failure-follows with Dr. Cherrie #ICD in place with history of ventricular tachycardia S: Medication: Metoprolol  25 mg extended release, digoxin  0.125mg , Lasix  20 mg-once a week, Entresto  24-26 mg BID.  Also on Jardiance  10 mg for diabetes/heart  failure -ICD monitored by cardiology A/P: euvolemic- slight swelling in left leg chronically. Continue current medications for now     # Atrial fibrillation chronic/ history of cardioemolic CVA/history subdural hematoma S: Compliant with metoprolol  25 mg XR for rate control and Xarelto  20 mg for anticoagulation. -Does have history of subdural hematoma-Xarelto  was held short-term but was restarted once resolved -History of cardioembolic strokes we prefer to have him on Xarelto   as long as no falls A/P: appropriately anticoagulated and rate controlled- continue current medicine   #CAD/hyperlipidemia/carotid artery stenosis S: History of MI in 1983.  Denies history of stents or bypass-streptokinase was used in the past per patient. -Due to bleeding risk is on Xarelto  alone and not on aspirin  -Patient compliant with rosuvastatin  40 mg daily, Zetia  10 mg daily Lab Results  Component Value Date   CHOL 138 06/07/2023   HDL 46.10 06/07/2023   LDLCALC 78 06/07/2023   LDLDIRECT 81.0 12/08/2023   TRIG 74.0 06/07/2023   CHOLHDL 3 06/07/2023  A/P: LDL last year very close to ideal goals- update with labs  -Carotid artery stenosis in 2015.  Only 1 to 39% stenosis-repeat 2 years stable 02/01/23- repeat 2026  #COPD S: no rx -symbicort  did not make a big difference in breathing A/P: not bothersome lately- wants to monitor- occasionally winded at night- not needing more pillows and doesn't sound like orthopnea   # Diabetes S: Compliant with metformin  1000mg  twice daily ER , jardiance  10mg  (q2 hour urination so wants to avoid increase - and every hour at night), amaryl  8 mg Lab Results  Component Value Date   HGBA1C 7.9 (H) 12/08/2023   HGBA1C 7.1 (H) 06/07/2023   HGBA1C 6.6 (H) 02/01/2023  A/P: hopefully stable or improved- update a1c today. Continue current meds for now   #Hypertension S: Compliant with metoprolol  25 mg extended release, Entresto  24-26 mg daily, Lasix  20 mg weekly .  Blood  pressure traditionally runs very low but these medications have been required due to CHF  A/P: well controlled continue current medications    #OSA-compliant with CPAP consistently  Recommended follow up: Return in about 6 months (around 12/08/2024) for followup or sooner if needed.Schedule b4 you leave. As long as a1c below 8 Future Appointments  Date Time Provider Department Center  07/05/2024  7:00 AM [facility] HVT DEVICE REMOTES [facility]  10/04/2024  7:00 AM CVD HVT DEVICE REMOTES [facility]  01/03/2025  7:00 AM CVD HVT DEVICE REMOTES [facility]  04/04/2025  7:00 AM CVD HVT DEVICE REMOTES [facility]  07/04/2025  7:00 AM CVD HVT DEVICE REMOTES [facility]  10/03/2025  7:00 AM CVD HVT DEVICE REMOTES [facility]  01/02/2026  7:00 AM CVD HVT DEVICE REMOTES [facility]   Lab/Order associations: will come back for labs   ICD-10-CM   1. Preventative health care  Z00.00     2. Controlled type 2 diabetes mellitus without complication, without long-term current use of insulin  (HCC)  E11.9 Comprehensive metabolic panel with GFR    CBC with Differential/Platelet    Lipid panel    Hemoglobin A1c    Microalbumin / creatinine urine ratio    3. Coronary artery disease involving native coronary artery of native heart without angina pectoris  I25.10     4. Other chronic pulmonary embolism without acute cor pulmonale (HCC)  I27.82     5. Former smoker  Z87.891 Urinalysis, Routine w reflex microscopic    6. Chronic systolic heart failure (HCC)  P49.77     7. Chronic atrial fibrillation (HCC)  I48.20     8. Hyperlipidemia associated with type 2 diabetes mellitus (HCC)  E11.69    E78.5     9. Essential hypertension  I10     10. Permanent atrial fibrillation (HCC)  I48.21       No orders of the defined types were placed in this encounter.   Return precautions advised.  Garnette Lukes,  MD

## 2024-06-11 ENCOUNTER — Ambulatory Visit
Admission: RE | Admit: 2024-06-11 | Discharge: 2024-06-11 | Disposition: A | Discharge status: Home / Self Care | Source: Ambulatory Visit | Attending: Family Medicine | Admitting: Family Medicine

## 2024-06-11 DIAGNOSIS — R918 Other nonspecific abnormal finding of lung field: Secondary | ICD-10-CM | POA: Diagnosis not present

## 2024-06-11 DIAGNOSIS — R911 Solitary pulmonary nodule: Secondary | ICD-10-CM

## 2024-06-11 DIAGNOSIS — I700 Atherosclerosis of aorta: Secondary | ICD-10-CM | POA: Diagnosis not present

## 2024-06-11 DIAGNOSIS — I517 Cardiomegaly: Secondary | ICD-10-CM | POA: Diagnosis not present

## 2024-06-11 DIAGNOSIS — J439 Emphysema, unspecified: Secondary | ICD-10-CM | POA: Diagnosis not present

## 2024-06-13 ENCOUNTER — Ambulatory Visit: Payer: Self-pay | Admitting: Family Medicine

## 2024-06-15 ENCOUNTER — Telehealth: Payer: Self-pay

## 2024-06-15 ENCOUNTER — Encounter: Payer: Self-pay | Admitting: Internal Medicine

## 2024-06-15 DIAGNOSIS — M25552 Pain in left hip: Secondary | ICD-10-CM

## 2024-06-15 DIAGNOSIS — M5416 Radiculopathy, lumbar region: Secondary | ICD-10-CM

## 2024-06-15 DIAGNOSIS — G8929 Other chronic pain: Secondary | ICD-10-CM

## 2024-06-15 NOTE — Telephone Encounter (Signed)
 Hey Bri,  Pt had appt scheduled for MRI's yesterday at [facility] but when he arrived, he was advised that the scans could not be done because they were not authorized.   I didn't see shara info in the referral, did it expire?

## 2024-06-15 NOTE — Telephone Encounter (Signed)
 Per telephone note on 03/13/24 it stated that their office does the prior authorizations. So he should call them back

## 2024-06-17 ENCOUNTER — Other Ambulatory Visit: Payer: Self-pay | Admitting: Family Medicine

## 2024-06-18 NOTE — Addendum Note (Signed)
 Addended by: MARDY LEOTIS RAMAN on: 06/18/2024 01:33 PM   Modules accepted: Orders

## 2024-06-18 NOTE — Telephone Encounter (Addendum)
 It appears that orders have expired. New orders placed. Forwarding to [place] as FYI.   [facility] and was advised that they do not do pre-cert for external orders/referrals, our office will need to obtain PA.   They also did note that pt refused MRI of the knee stating that it had already been done, OK to pre-cert for the hip and low back.   [facility] TAX ID: [id number] NPI: [id number]  Address: [address]                 [place] [address] [phone]

## 2024-06-18 NOTE — Telephone Encounter (Signed)
 Called pt, left VM to call the office for update.

## 2024-06-20 ENCOUNTER — Other Ambulatory Visit (INDEPENDENT_AMBULATORY_CARE_PROVIDER_SITE_OTHER)

## 2024-06-20 DIAGNOSIS — Z87891 Personal history of nicotine dependence: Secondary | ICD-10-CM | POA: Diagnosis not present

## 2024-06-20 DIAGNOSIS — E119 Type 2 diabetes mellitus without complications: Secondary | ICD-10-CM | POA: Diagnosis not present

## 2024-06-20 LAB — URINALYSIS, ROUTINE W REFLEX MICROSCOPIC
Bilirubin Urine: NEGATIVE
Hgb urine dipstick: NEGATIVE
Leukocytes,Ua: NEGATIVE
Nitrite: NEGATIVE
RBC / HPF: NONE SEEN (ref 0–?)
Specific Gravity, Urine: 1.015 (ref 1.000–1.030)
Total Protein, Urine: NEGATIVE
Urine Glucose: >=1000 — AB
Urobilinogen, UA: 0.2 (ref 0.0–1.0)
pH: 6 (ref 5.0–8.0)

## 2024-06-20 LAB — COMPREHENSIVE METABOLIC PANEL WITH GFR
ALT: 20 U/L (ref 0–53)
AST: 20 U/L (ref 0–37)
Albumin: 4.2 g/dL (ref 3.5–5.2)
Alkaline Phosphatase: 49 U/L (ref 39–117)
BUN: 19 mg/dL (ref 6–23)
CO2: 29 meq/L (ref 19–32)
Calcium: 9.4 mg/dL (ref 8.4–10.5)
Chloride: 103 meq/L (ref 96–112)
Creatinine, Ser: 0.73 mg/dL (ref 0.40–1.50)
GFR: 85.76 mL/min (ref >60.00)
Glucose, Bld: 152 mg/dL — ABNORMAL HIGH (ref 70–99)
Potassium: 4.2 meq/L (ref 3.5–5.1)
Sodium: 141 meq/L (ref 135–145)
Total Bilirubin: 0.9 mg/dL (ref 0.2–1.2)
Total Protein: 6.5 g/dL (ref 6.0–8.3)

## 2024-06-20 LAB — LIPID PANEL
Cholesterol: 104 mg/dL (ref 0–200)
HDL: 39.9 mg/dL (ref >39.00)
LDL Cholesterol: 49 mg/dL (ref 0–99)
NonHDL: 63.99
Total CHOL/HDL Ratio: 3
Triglycerides: 75 mg/dL (ref 0.0–149.0)
VLDL: 15 mg/dL (ref 0.0–40.0)

## 2024-06-20 LAB — CBC WITH DIFFERENTIAL/PLATELET
Basophils Absolute: 0 K/uL (ref 0.0–0.1)
Basophils Relative: 0.9 % (ref 0.0–3.0)
Eosinophils Absolute: 0.1 K/uL (ref 0.0–0.7)
Eosinophils Relative: 2.3 % (ref 0.0–5.0)
HCT: 45.8 % (ref 39.0–52.0)
Hemoglobin: 15.2 g/dL (ref 13.0–17.0)
Lymphocytes Relative: 26.1 % (ref 12.0–46.0)
Lymphs Abs: 1.4 K/uL (ref 0.7–4.0)
MCHC: 33.2 g/dL (ref 30.0–36.0)
MCV: 98.9 fl (ref 78.0–100.0)
Monocytes Absolute: 0.6 K/uL (ref 0.1–1.0)
Monocytes Relative: 10.7 % (ref 3.0–12.0)
Neutro Abs: 3.2 K/uL (ref 1.4–7.7)
Neutrophils Relative %: 60 % (ref 43.0–77.0)
Platelets: 174 K/uL (ref 150.0–400.0)
RBC: 4.63 Mil/uL (ref 4.22–5.81)
RDW: 14.2 % (ref 11.5–15.5)
WBC: 5.3 K/uL (ref 4.0–10.5)

## 2024-06-20 LAB — MICROALBUMIN / CREATININE URINE RATIO
Creatinine,U: 54.3 mg/dL
Microalb Creat Ratio: 24.6 mg/g (ref 0.0–30.0)
Microalb, Ur: 1.3 mg/dL (ref 0.0–1.9)

## 2024-06-20 LAB — HEMOGLOBIN A1C: Hgb A1c MFr Bld: 8.2 % — ABNORMAL HIGH (ref 4.6–6.5)

## 2024-06-20 NOTE — Telephone Encounter (Signed)
 These have all been approved.  All I need to do is fax the orders to [facility].  I will get to that today

## 2024-06-20 NOTE — Telephone Encounter (Signed)
Called pt, left VM to call the office.  

## 2024-06-20 NOTE — Telephone Encounter (Addendum)
 Jaquawn returned call and was advised of MRI approvals and that orders would be faxed today. I apologizes for the error with the previous orders and inconvenience of travel to [facility]. Pt verbalized understanding. He will reach out if he has not heard from the imaging center over the next week.   New orders placed for external facility  MRI L-spine MRI left hip MRI left knee

## 2024-06-20 NOTE — Telephone Encounter (Signed)
 Cannot get fax to go through. Last telephone note stated to email orders to [email or web address] That is what I will do

## 2024-06-22 NOTE — Telephone Encounter (Signed)
 Called and lm for pt tcb.  Copied from CRM [id number]. Topic: Clinical - Lab/Test Results >> Jun 19, 2024  3:24 PM Suzen RAMAN wrote: Reason for CRM: Patient would like a call back to discuss CT scan results. E2C2 agent are not permitted to discuss imaging results.   CB#[phone] (H)

## 2024-06-25 NOTE — Telephone Encounter (Signed)
 Pt has viewed results and comments via mychart.  Copied from CRM [id number]. Topic: Clinical - Lab/Test Results >> Jun 22, 2024 12:52 PM Deleta RAMAN wrote: Reason for CRM: patient is calling missed called from nurse possibly ct scan results. You can contact him at [phone]

## 2024-07-01 ENCOUNTER — Other Ambulatory Visit: Payer: Self-pay | Admitting: Family Medicine

## 2024-07-05 ENCOUNTER — Ambulatory Visit: Payer: Medicare HMO

## 2024-07-05 DIAGNOSIS — I472 Ventricular tachycardia, unspecified: Secondary | ICD-10-CM

## 2024-07-05 LAB — CUP PACEART REMOTE DEVICE CHECK
Battery Remaining Longevity: 88 mo
Battery Voltage: 3.01 V
Brady Statistic AP VP Percent: 0 %
Brady Statistic AP VS Percent: 0 %
Brady Statistic AS VP Percent: 92.03 %
Brady Statistic AS VS Percent: 7.97 %
Brady Statistic RA Percent Paced: INVALID
Brady Statistic RV Percent Paced: 92.02 %
Date Time Interrogation Session: 20250730221041
HighPow Impedance: 70 Ohm
Implantable Lead Connection Status: 753985
Implantable Lead Connection Status: 753985
Implantable Lead Implant Date: 20010723
Implantable Lead Implant Date: 20180423
Implantable Lead Location: 753858
Implantable Lead Location: 753860
Implantable Lead Model: 6943
Implantable Pulse Generator Implant Date: 20250129
Lead Channel Impedance Value: 1007 Ohm
Lead Channel Impedance Value: 285 Ohm
Lead Channel Impedance Value: 3000 Ohm
Lead Channel Impedance Value: 418 Ohm
Lead Channel Impedance Value: 475 Ohm
Lead Channel Impedance Value: 475 Ohm
Lead Channel Impedance Value: 494 Ohm
Lead Channel Impedance Value: 551 Ohm
Lead Channel Impedance Value: 874 Ohm
Lead Channel Impedance Value: 912 Ohm
Lead Channel Impedance Value: 912 Ohm
Lead Channel Impedance Value: 912 Ohm
Lead Channel Impedance Value: 950 Ohm
Lead Channel Pacing Threshold Amplitude: 1.375 V
Lead Channel Pacing Threshold Amplitude: 1.625 V
Lead Channel Pacing Threshold Pulse Width: 0.4 ms
Lead Channel Pacing Threshold Pulse Width: 0.5 ms
Lead Channel Sensing Intrinsic Amplitude: 13.6 mV
Lead Channel Setting Pacing Amplitude: 2.25 V
Lead Channel Setting Pacing Amplitude: 2.75 V
Lead Channel Setting Pacing Pulse Width: 0.4 ms
Lead Channel Setting Pacing Pulse Width: 0.5 ms
Lead Channel Setting Sensing Sensitivity: 0.3 mV
Zone Setting Status: 755011

## 2024-07-09 ENCOUNTER — Ambulatory Visit: Payer: Self-pay | Admitting: Cardiology

## 2024-07-14 DIAGNOSIS — M79642 Pain in left hand: Secondary | ICD-10-CM | POA: Diagnosis not present

## 2024-07-16 NOTE — Progress Notes (Signed)
 Carl Wood D.CLEMENTEEN [name] [address] [place] [address] Phone: [phone]   Assessment and Plan:     1. Primary osteoarthritis of first carpometacarpal joint of left hand - Chronic with exacerbation, initial visit - Consistent with flare of CMC osteoarthritis - Overall improving with conservative care, rest - Use Tylenol 500 to 1000 mg tablets 2-3 times a day for day-to-day pain relief - Recommend using thumb spica brace when thumb pain is flared - Recommend topical Voltaren gel over areas of pain - Recommend warm hand baths - Patient brought in disc from urgent care with left hand x-ray.  My interpretation: No acute fracture or dislocation.  Moderate CMC osteoarthritis degenerative changes.  Small, superficial foreign body over dorsum of third MCP.  Foreign body represents incidental finding and is asymptomatic at clinic   Pertinent previous records reviewed include hand x-ray from urgent care  Follow Up: As needed if no improvement or worsening of symptoms.  Could consider [facility]   Subjective:   I, Carl Wood, am serving as a Neurosurgeon for Doctor Morene Mace  Chief Complaint: L hand pain   HPI:   07/17/2024 Patient is a 83 year old male with hand pain. Patient states pain started thursday night . Notes he had swelling but that has gone down some. Tylenol has helped. He is wearing a thumb brace.went to UC and was told he has arthritis. But he thinks he was bit by something. He did bring in films from UC    Relevant Historical Information: Hypertension, history of PE, LBBB, OSA, COPD, DM type II  Additional pertinent review of systems negative.   Current Outpatient Medications:    ascorbic acid (VITAMIN C) 500 MG tablet, Take 500 mg by mouth daily., Disp: , Rfl:    blood glucose meter kit and supplies KIT, Dispense based on patient and insurance preference. Use up to four times daily as directed., Disp: 1 each, Rfl: 0    cetirizine (ZYRTEC) 10 MG tablet, Take 10 mg by mouth daily., Disp: , Rfl:    digoxin (LANOXIN) 0.25 MG tablet, Take 1/2 (one-half) tablet by mouth once daily, Disp: 45 tablet, Rfl: 0   empagliflozin (JARDIANCE) 10 MG TABS tablet, Take 1 tablet (10 mg total) by mouth daily., Disp: 90 tablet, Rfl: 3   ezetimibe (ZETIA) 10 MG tablet, Take 1 tablet (10 mg total) by mouth daily., Disp: 90 tablet, Rfl: 3   furosemide (LASIX) 20 MG tablet, Take 1 tablet by mouth once a week, Disp: 12 tablet, Rfl: 0   glimepiride (AMARYL) 4 MG tablet, TAKE 2 TABLETS BY MOUTH ONCE DAILY WITH BREAKFAST, Disp: 180 tablet, Rfl: 0   glucose blood (ACCU-CHEK GUIDE) test strip, USE UP TO FOUR TIMES DAILY Dx: E11.9, Disp: 450 each, Rfl: 3   metFORMIN (GLUCOPHAGE-XR) 500 MG 24 hr tablet, TAKE 2 TABLETS BY MOUTH IN THE MORNING AND 2 AT BEDTIME, Disp: 360 tablet, Rfl: 0   metoprolol succinate (TOPROL-XL) 25 MG 24 hr tablet, TAKE 1 TABLET BY MOUTH AT BEDTIME, Disp: 90 tablet, Rfl: 0   predniSONE (DELTASONE) 5 MG tablet, Take 5 mg by mouth daily with breakfast., Disp: , Rfl:    rivaroxaban (XARELTO) 20 MG TABS tablet, Take 1 tablet by mouth once daily, Disp: 90 tablet, Rfl: 1   rosuvastatin (CRESTOR) 40 MG tablet, Take 1 tablet by mouth once daily, Disp: 90 tablet, Rfl: 0   sacubitril-valsartan (ENTRESTO) 24-26 MG, Take 1 tablet by mouth twice  daily, Disp: 180 tablet, Rfl: 2   TURMERIC PO, Take 1,400 mg by mouth 2 (two) times daily., Disp: , Rfl:    Objective:     Vitals:   07/17/24 0828  Pulse: 86  SpO2: 99%  Weight: 193 lb (87.5 kg)  Height: 6' (1.829 m)      Body mass index is 26.18 kg/m.    Physical Exam:    General: Appears well, nad, nontoxic and pleasant Neuro:sensation intact, strength is 5/5 in upper extremities, muscle tone wnl Skin:no susupicious lesions or rashes  Left hand/Wrist:   No deformity or swelling appreciated. ROM  Ext 90, flexion 70, radial/ulnar deviation 30 TTP CMC.  Positive grind test at  [facility] nttp over the snuff box, dorsal carpals, volar carpals, radial styloid, ulnar styloid, 1st mcp, tfcc Negative Tinel's, Phalen's, Prayer Tests Negative finklestein Neg tfcc bounce test No pain with resisted ext, flex or deviation    Electronically signed by:  Odis Mace D.[name] 9:05 AM 07/17/24

## 2024-07-17 ENCOUNTER — Ambulatory Visit: Admitting: Sports Medicine

## 2024-07-17 VITALS — HR 86 | Ht 72.0 in | Wt 193.0 lb

## 2024-07-17 DIAGNOSIS — M1812 Unilateral primary osteoarthritis of first carpometacarpal joint, left hand: Secondary | ICD-10-CM

## 2024-07-17 NOTE — Patient Instructions (Signed)
 Tylenol  [phone] mg 2-3 times a day for pain relief   Recommend warm hand baths   Topical voltaren  gel over areas of pain   As needed follow up, if no improvement 3-4 week follow up for a thumb injection

## 2024-08-06 ENCOUNTER — Other Ambulatory Visit (HOSPITAL_COMMUNITY): Payer: Self-pay | Admitting: Internal Medicine

## 2024-08-07 ENCOUNTER — Other Ambulatory Visit: Payer: Self-pay | Admitting: Family Medicine

## 2024-08-08 NOTE — Telephone Encounter (Signed)
 Pt returned call, wouldn't mind having the scan done more locally and sending results to new cardiologist if that's what needs to be done, they would just need to be able to work with his pacemaker.   Pt also advised that if new order was sent, they would change it to the new cardiologist that will be assigned to him.   Dr. Joane called [facility] and left VM, waiting on call back.

## 2024-08-08 NOTE — Addendum Note (Signed)
 Addended by: MARDY LEOTIS RAMAN on: 08/08/2024 03:08 PM   Modules accepted: Orders

## 2024-08-08 NOTE — Telephone Encounter (Signed)
 Pt never heard from [facility] about the MRI and called them today. The orders were in but NOT in Dr. Virgilio name. They were in Dr. Lenon Nightingale name, which is now a problem because this provider has passed.  Per pt, [facility] needs new order faxed to [phone], reminder that pt has a defibrillator and is also requesting mild sedative for this procedure.

## 2024-08-08 NOTE — Telephone Encounter (Signed)
 Precert was done but expires in about 2 weeks, doubtful that he will be able to get appointment before PA expires. Can we re-auth please?

## 2024-08-08 NOTE — Telephone Encounter (Signed)
 Called pt and left VM to call the office.   Would like to see if OK to have scans done locally and then send reports to cardiology.

## 2024-08-09 ENCOUNTER — Telehealth: Payer: Self-pay | Admitting: Family Medicine

## 2024-08-09 NOTE — Telephone Encounter (Signed)
 I received a call from [facility] stating their first available opening would not be until October. They asked if the authorization could be extended? I advised that we may have to wait until it expires before it can be ran again.

## 2024-08-09 NOTE — Telephone Encounter (Signed)
 No, she would not schedule due to them not being able to get him in before it expired. She asked if we could request authorization for mid to late October?

## 2024-08-10 NOTE — Telephone Encounter (Signed)
 Called and spoke with patient, he has not been contacted by [facility] yet about scheduling. He will reach out to [facility] to see if he can get scheduled now that they have the order and authorization information has been sent.   He will keep me updated.

## 2024-08-10 NOTE — Telephone Encounter (Signed)
 Pt called, states [facility] has scheduled him for 08/22/2024, and are aware that shara will be expired at that date. Informed pt we were working on extending serbia.

## 2024-08-10 NOTE — Telephone Encounter (Signed)
 Was able to extend the auth date til 09/04/2024

## 2024-08-10 NOTE — Telephone Encounter (Signed)
 [phone] press option 3 and let them know Monday the updated auth information

## 2024-08-13 NOTE — Telephone Encounter (Signed)
 [name], [place]    08/10/24  2:58 PM Note Was able to extend the auth date til 09/04/2024    [name], CMA    08/10/24  2:51 PM Note [phone] press option 3 and let them know Monday the updated auth information          08/10/24  2:18 PM [name] routed this conversation to Me  [name], CMA (Selected Message)  Rudolpho Clarity SAUNDERS RENETTE   08/10/24  2:18 PM Note Pt called, states [facility] has scheduled him for 08/22/2024, and are aware that shara will be expired at that date. Informed pt we were working on extending serbia.

## 2024-08-13 NOTE — Telephone Encounter (Signed)
Noted. That's great news.

## 2024-08-13 NOTE — Telephone Encounter (Signed)
 Looks like pt may be scheduled for 08/22/24, PA expires 08/19/24, will need to f/u with Faxon at [facility] [phone].   Date Type Department Care Team (Latest Contact Info) Description  08/22/2024 8:45 AM EDT Hospital Encounter IMG MRI [facility]  [address]  [place], [place] [address]  [phone]  Campbell Lenon JINNY ELNITA  [address]  [place], [place] [address]  [phone] (Work)  [phone] (Fax)

## 2024-08-22 DIAGNOSIS — K4090 Unilateral inguinal hernia, without obstruction or gangrene, not specified as recurrent: Secondary | ICD-10-CM | POA: Diagnosis not present

## 2024-08-22 DIAGNOSIS — M48061 Spinal stenosis, lumbar region without neurogenic claudication: Secondary | ICD-10-CM | POA: Diagnosis not present

## 2024-08-22 DIAGNOSIS — M1612 Unilateral primary osteoarthritis, left hip: Secondary | ICD-10-CM | POA: Diagnosis not present

## 2024-08-22 DIAGNOSIS — Z4502 Encounter for adjustment and management of automatic implantable cardiac defibrillator: Secondary | ICD-10-CM | POA: Diagnosis not present

## 2024-08-22 DIAGNOSIS — N210 Calculus in bladder: Secondary | ICD-10-CM | POA: Diagnosis not present

## 2024-08-22 DIAGNOSIS — R600 Localized edema: Secondary | ICD-10-CM | POA: Diagnosis not present

## 2024-08-22 DIAGNOSIS — M5136 Other intervertebral disc degeneration, lumbar region with discogenic back pain only: Secondary | ICD-10-CM | POA: Diagnosis not present

## 2024-08-22 DIAGNOSIS — M5137 Other intervertebral disc degeneration, lumbosacral region with discogenic back pain only: Secondary | ICD-10-CM | POA: Diagnosis not present

## 2024-08-22 DIAGNOSIS — M47816 Spondylosis without myelopathy or radiculopathy, lumbar region: Secondary | ICD-10-CM | POA: Diagnosis not present

## 2024-08-22 DIAGNOSIS — M25752 Osteophyte, left hip: Secondary | ICD-10-CM | POA: Diagnosis not present

## 2024-08-22 DIAGNOSIS — S76012A Strain of muscle, fascia and tendon of left hip, initial encounter: Secondary | ICD-10-CM | POA: Diagnosis not present

## 2024-09-04 ENCOUNTER — Encounter: Payer: Self-pay | Admitting: Cardiology

## 2024-09-04 ENCOUNTER — Ambulatory Visit: Attending: Cardiology | Admitting: Cardiology

## 2024-09-04 VITALS — BP 114/62 | HR 89 | Ht 72.0 in | Wt 188.6 lb

## 2024-09-04 DIAGNOSIS — G4733 Obstructive sleep apnea (adult) (pediatric): Secondary | ICD-10-CM

## 2024-09-04 DIAGNOSIS — I10 Essential (primary) hypertension: Secondary | ICD-10-CM | POA: Diagnosis not present

## 2024-09-04 NOTE — Patient Instructions (Signed)
 Medication Instructions:  Your physician recommends that you continue on your current medications as directed. Please refer to the Current Medication list given to you today.  *If you need a refill on your cardiac medications before your next appointment, please call your pharmacy*  Lab Work: none If you have labs (blood work) drawn today and your tests are completely normal, you will receive your results only by: MyChart Message (if you have MyChart) OR A paper copy in the mail If you have any lab test that is abnormal or we need to change your treatment, we will call you to review the results.  Testing/Procedures: none  Follow-Up: At [facility], you and your health needs are our priority.  As part of our continuing mission to provide you with exceptional heart care, our providers are all part of one team.  This team includes your primary Cardiologist (physician) and Advanced Practice Providers or APPs (Physician Assistants and Nurse Practitioners) who all work together to provide you with the care you need, when you need it.  Your next appointment:   12 month(s)  Provider:   Dr Shlomo   We recommend signing up for the patient portal called MyChart.  Sign up information is provided on this After Visit Summary.  MyChart is used to connect with patients for Virtual Visits (Telemedicine).  Patients are able to view lab/test results, encounter notes, upcoming appointments, etc.  Non-urgent messages can be sent to your provider as well.   To learn more about what you can do with MyChart, go to [email or web address].   Other Instructions

## 2024-09-04 NOTE — Progress Notes (Signed)
 Cardiology Office Note  ID:  Carl, Wood 02-13-1941, MRN [id number]  PCP:  Carl Garnette KIDD, MD  Cardiologist:  Carl Fuel, MD Sleep Medicine:  Carl Bihari, MD Electrophysiologist:  Carl ONEIDA HOLTS, MD   Chief Complaint:  OSA  History of Present Illness:    Carl Wood is a 83 y.o. adult with a hx of moderate to severe OSA with an AHI of 25.2/hr and is on CPAP at 8cm H2O. at last office visit he wanted a new device which we ordered.  He is now here for follow-up per insurance requirements to assess compliance.  He is doing well with his PAP device and thinks that he has gotten used to it.  He has had some problems with the machine not coming on when he puts his mask on but has not talked with the DME.  He tolerates the nasal mask and feels the pressure is adequate. He has never felt rested int he am even with CPAP. He does get sleepy during the day and falls asleep easily if he sits down.  He denies any significant nasal dryness or nasal congestion.  He does have problems with mouth dryness but not a major problem.  He has tried the chin strap and does not like it and does not want to change to a FFM.  He does not think that he snores. An Epworth Sleepiness Scale score was calculated the office today and this endorsed at 5 arguing against residual daytime sleepiness. Patient denies any episodes of bruxism, restless legs, No hypnogognic hallucinations or cataplectic events.     Prior CV studies:   The following studies were reviewed today:  PAP compliance download  Past Medical History:  Diagnosis Date   CAD (coronary artery disease)    CHF (congestive heart failure) (HCC)    Chronic atrial fibrillation (HCC)    Colon polyps    Diabetes mellitus    Diverticulosis of colon    DIVERTICULOSIS, COLON 10/16/2006   Qualifier: Diagnosis of  By: Tammie MD, Bruce     Excessive daytime sleepiness 02/19/2016   Hyperlipidemia    Hypertension    MI (mitral  incompetence)    Nephrolithiasis    NEPHROLITHIASIS 06/26/2008   Qualifier: Diagnosis of  By: Delos, CMA, Cindy     OSA (obstructive sleep apnea) 02/19/2016   Moderate to severe OSA with an AHI of 25/hr and on CPAP at 8cm H2O   PE (pulmonary embolism)    V-tach [facility])    Past Surgical History:  Procedure Laterality Date   BACK SURGERY     BASAL CELL CARCINOMA EXCISION     nose   BIV UPGRADE N/A 03/28/2017   Procedure: BiV Upgrade;  Surgeon: Elspeth JAYSON Sage, MD;  Location: [facility] INVASIVE CV LAB;  Service: Cardiovascular;  Laterality: N/A;   CARDIAC CATHETERIZATION N/A 01/20/2016   Procedure: Right/Left Heart Cath and Coronary Angiography;  Surgeon: Carl JONELLE Fuel, MD;  Location: [facility] INVASIVE CV LAB;  Service: Cardiovascular;  Laterality: N/A;   CARDIAC DEFIBRILLATOR PLACEMENT     medtronic virtuoso   CHOLECYSTECTOMY     COLONOSCOPY  12/11/2012   Procedure: COLONOSCOPY;  Surgeon: Lamar JONETTA Aho, MD;  Location: [facility] ENDOSCOPY;  Service: Endoscopy;  Laterality: N/A;   ICD GENERATOR CHANGEOUT N/A 01/04/2024   Procedure: ICD GENERATOR CHANGEOUT;  Surgeon: Sage Elspeth JAYSON, MD;  Location: [facility] INVASIVE CV LAB;  Service: Cardiovascular;  Laterality: N/A;   KNEE ARTHROPLASTY  Current Meds  Medication Sig   ascorbic acid (VITAMIN C) 500 MG tablet Take 500 mg by mouth daily.   blood glucose meter kit and supplies KIT Dispense based on patient and insurance preference. Use up to four times daily as directed.   cetirizine (ZYRTEC) 10 MG tablet Take 10 mg by mouth daily.   digoxin  (LANOXIN ) 0.25 MG tablet Take 1/2 (one-half) tablet by mouth once daily   ezetimibe  (ZETIA ) 10 MG tablet Take 1 tablet (10 mg total) by mouth daily.   furosemide  (LASIX ) 20 MG tablet Take 1 tablet by mouth once a week   glimepiride  (AMARYL ) 4 MG tablet TAKE 2 TABLETS BY MOUTH ONCE DAILY WITH BREAKFAST   glucose blood (ACCU-CHEK GUIDE) test strip USE UP TO FOUR TIMES DAILY Dx: E11.9   JARDIANCE  10 MG TABS tablet Take 1  tablet by mouth once daily   metFORMIN  (GLUCOPHAGE -XR) 500 MG 24 hr tablet TAKE 2 TABLETS BY MOUTH IN THE MORNING AND 2 AT BEDTIME   metoprolol  succinate (TOPROL -XL) 25 MG 24 hr tablet TAKE 1 TABLET BY MOUTH AT BEDTIME   rivaroxaban  (XARELTO ) 20 MG TABS tablet Take 1 tablet by mouth once daily   rosuvastatin  (CRESTOR ) 40 MG tablet Take 1 tablet by mouth once daily   sacubitril -valsartan  (ENTRESTO ) 24-26 MG Take 1 tablet by mouth twice daily   TURMERIC PO Take 1,400 mg by mouth 2 (two) times daily.   [DISCONTINUED] predniSONE (DELTASONE) 5 MG tablet Take 5 mg by mouth daily with breakfast.     Allergies:   Penicillins and Sulfamethoxazole   Social History   Tobacco Use   Smoking status: Former    Current packs/day: 0.00    Average packs/day: 1 pack/day for 50.0 years (50.0 ttl pk-yrs)    Types: Cigarettes    Start date: 12/15/1962    Quit date: 12/15/2012    Years since quitting: 11.7   Smokeless tobacco: Never  Vaping Use   Vaping status: Never Used  Substance Use Topics   Alcohol use: No    Alcohol/week: 0.0 standard drinks of alcohol   Drug use: No     Family Hx: The patient's family history includes Bladder Cancer in his mother; Colon cancer in his father; Diabetes in his father; Heart attack (age of onset: 81) in his child; Heart attack (age of onset: 26) in his child; Heart disease in his father and mother.  ROS:   Please see the history of present illness.     All other systems reviewed and are negative.   Labs/Other Tests and Data Reviewed:    Recent Labs: 06/01/2024: B Natriuretic Peptide 145.7 06/20/2024: ALT 20; BUN 19; Creatinine, Ser 0.73; Hemoglobin 15.2; Platelets 174.0; Potassium 4.2; Sodium 141   Recent Lipid Panel Lab Results  Component Value Date/Time   CHOL 104 06/20/2024 09:33 AM   TRIG 75.0 06/20/2024 09:33 AM   TRIG 53 10/10/2006 11:13 AM   HDL 39.90 06/20/2024 09:33 AM   CHOLHDL 3 06/20/2024 09:33 AM   LDLCALC 49 06/20/2024 09:33 AM   LDLCALC  76 07/03/2020 02:17 PM   LDLDIRECT 81.0 12/08/2023 03:29 PM    Wt Readings from Last 3 Encounters:  09/04/24 188 lb 9.6 oz (85.5 kg)  07/17/24 193 lb (87.5 kg)  06/07/24 193 lb 9.6 oz (87.8 kg)     Objective:    Vital Signs:  BP 114/62   Pulse 89   Ht 6' (1.829 m)   Wt 188 lb 9.6 oz (85.5 kg)  SpO2 90%   BMI 25.58 kg/m   GEN: Well nourished, well developed in no acute distress HEENT: Normal NECK: No JVD; No carotid bruits LYMPHATICS: No lymphadenopathy CARDIAC:RRR, no murmurs, rubs, gallops RESPIRATORY:  Clear to auscultation without rales, wheezing or rhonchi  ABDOMEN: Soft, non-tender, non-distended MUSCULOSKELETAL:  No edema; No deformity  SKIN: Warm and dry NEUROLOGIC:  Alert and oriented x 3 PSYCHIATRIC:  Normal affect  ASSESSMENT & PLAN:    OSA - The patient is tolerating PAP therapy well without any problems. The PAP download performed by his DME was personally reviewed and interpreted by me today and showed an AHI of 2.9 /hr on 10 cm H2O with 77% compliance in using more than 4 hours nightly.  The patient has been using and benefiting from PAP use and will continue to benefit from therapy.  -encouraged him to change out his cushion monthly  HTN - BP controlled on exam today - Continue Toprol  XL 25 mg daily and Entresto  24-26 mg twice daily with as needed refills  Medication Adjustments/Labs and Tests Ordered: Current medicines are reviewed at length with the patient today.  Concerns regarding medicines are outlined above.  Tests Ordered: No orders of the defined types were placed in this encounter.  Medication Changes: No orders of the defined types were placed in this encounter.   Disposition:  Follow up in 1 year(s)  Signed, Carl Bihari, MD  09/04/2024 2:14 PM    [facility]

## 2024-09-05 ENCOUNTER — Telehealth: Payer: Self-pay

## 2024-09-05 DIAGNOSIS — M5416 Radiculopathy, lumbar region: Secondary | ICD-10-CM

## 2024-09-05 NOTE — Telephone Encounter (Signed)
 Referral placed to Dr. Eichman for management of back pain sx.

## 2024-09-05 NOTE — Progress Notes (Signed)
 Remote ICD Transmission

## 2024-09-07 ENCOUNTER — Telehealth: Payer: Self-pay | Admitting: *Deleted

## 2024-09-07 DIAGNOSIS — G4733 Obstructive sleep apnea (adult) (pediatric): Secondary | ICD-10-CM

## 2024-09-07 NOTE — Telephone Encounter (Signed)
 Order placed to [employer] via community message for supplies.

## 2024-09-07 NOTE — Telephone Encounter (Signed)
-----   Message from Wilbert Bihari sent at 09/04/2024  2:17 PM EDT ----- Order new PAP supplies

## 2024-09-10 DIAGNOSIS — D225 Melanocytic nevi of trunk: Secondary | ICD-10-CM | POA: Diagnosis not present

## 2024-09-10 DIAGNOSIS — D492 Neoplasm of unspecified behavior of bone, soft tissue, and skin: Secondary | ICD-10-CM | POA: Diagnosis not present

## 2024-09-10 DIAGNOSIS — Z08 Encounter for follow-up examination after completed treatment for malignant neoplasm: Secondary | ICD-10-CM | POA: Diagnosis not present

## 2024-09-10 DIAGNOSIS — D0462 Carcinoma in situ of skin of left upper limb, including shoulder: Secondary | ICD-10-CM | POA: Diagnosis not present

## 2024-09-10 DIAGNOSIS — L570 Actinic keratosis: Secondary | ICD-10-CM | POA: Diagnosis not present

## 2024-09-10 DIAGNOSIS — L821 Other seborrheic keratosis: Secondary | ICD-10-CM | POA: Diagnosis not present

## 2024-09-10 DIAGNOSIS — L814 Other melanin hyperpigmentation: Secondary | ICD-10-CM | POA: Diagnosis not present

## 2024-09-10 DIAGNOSIS — Z85828 Personal history of other malignant neoplasm of skin: Secondary | ICD-10-CM | POA: Diagnosis not present

## 2024-09-10 DIAGNOSIS — L905 Scar conditions and fibrosis of skin: Secondary | ICD-10-CM | POA: Diagnosis not present

## 2024-09-11 ENCOUNTER — Other Ambulatory Visit: Payer: Self-pay | Admitting: Cardiology

## 2024-09-13 ENCOUNTER — Telehealth: Payer: Self-pay | Admitting: Family Medicine

## 2024-09-13 ENCOUNTER — Telehealth: Payer: Self-pay | Admitting: Sports Medicine

## 2024-09-13 NOTE — Telephone Encounter (Signed)
 Bianca with [facility] called and stated that they received a referral for  lumbar radiculopathy but the notes discuss his hand and they asked to have some notes sent regarding patients back. Please advise.

## 2024-09-14 NOTE — Telephone Encounter (Signed)
 Called and left a VM to get some clarity. Not sure if the office visit notes that were sent were for pt's last vist w/ Carl Wood for his hand? Please advsie.

## 2024-09-14 NOTE — Telephone Encounter (Signed)
 error

## 2024-09-17 ENCOUNTER — Other Ambulatory Visit: Payer: Self-pay | Admitting: Family Medicine

## 2024-09-17 NOTE — Telephone Encounter (Signed)
 MRI report printed and faxed to [facility] via fax.

## 2024-09-17 NOTE — Telephone Encounter (Signed)
 I believe this referral was placed in response to recent MRI done through [facility] 08/22/24:  IMPRESSION:  --Moderate lumbar degenerative disc disease with advanced multilevel facet arthrosis most notably producing severe neural foraminal narrowing at L4-L5 and moderate foraminal narrowing at L3-L4. The remaining levels in the lumbar spine demonstrate no more than mild spinal canal or neural foraminal narrowing.

## 2024-09-24 ENCOUNTER — Other Ambulatory Visit: Payer: Self-pay | Admitting: Family Medicine

## 2024-09-26 DIAGNOSIS — M47816 Spondylosis without myelopathy or radiculopathy, lumbar region: Secondary | ICD-10-CM | POA: Diagnosis not present

## 2024-10-01 ENCOUNTER — Other Ambulatory Visit: Payer: Self-pay | Admitting: Family Medicine

## 2024-10-04 ENCOUNTER — Ambulatory Visit (INDEPENDENT_AMBULATORY_CARE_PROVIDER_SITE_OTHER): Payer: Medicare HMO

## 2024-10-04 DIAGNOSIS — I472 Ventricular tachycardia, unspecified: Secondary | ICD-10-CM | POA: Diagnosis not present

## 2024-10-05 LAB — CUP PACEART REMOTE DEVICE CHECK
Battery Remaining Longevity: 86 mo
Battery Voltage: 2.99 V
Brady Statistic AP VP Percent: 0 %
Brady Statistic AP VS Percent: 0 %
Brady Statistic AS VP Percent: 93.49 %
Brady Statistic AS VS Percent: 6.51 %
Brady Statistic RA Percent Paced: INVALID
Brady Statistic RV Percent Paced: 93.48 %
Date Time Interrogation Session: 20251029203448
HighPow Impedance: 70 Ohm
Implantable Lead Connection Status: 753985
Implantable Lead Connection Status: 753985
Implantable Lead Implant Date: 20010723
Implantable Lead Implant Date: 20180423
Implantable Lead Location: 753858
Implantable Lead Location: 753860
Implantable Lead Model: 6943
Implantable Pulse Generator Implant Date: 20250129
Lead Channel Impedance Value: 285 Ohm
Lead Channel Impedance Value: 3000 Ohm
Lead Channel Impedance Value: 437 Ohm
Lead Channel Impedance Value: 456 Ohm
Lead Channel Impedance Value: 456 Ohm
Lead Channel Impedance Value: 494 Ohm
Lead Channel Impedance Value: 513 Ohm
Lead Channel Impedance Value: 855 Ohm
Lead Channel Impedance Value: 855 Ohm
Lead Channel Impedance Value: 874 Ohm
Lead Channel Impedance Value: 893 Ohm
Lead Channel Impedance Value: 912 Ohm
Lead Channel Impedance Value: 950 Ohm
Lead Channel Pacing Threshold Amplitude: 1.375 V
Lead Channel Pacing Threshold Amplitude: 1.625 V
Lead Channel Pacing Threshold Pulse Width: 0.4 ms
Lead Channel Pacing Threshold Pulse Width: 0.5 ms
Lead Channel Sensing Intrinsic Amplitude: 12.5 mV
Lead Channel Setting Pacing Amplitude: 2.25 V
Lead Channel Setting Pacing Amplitude: 2.75 V
Lead Channel Setting Pacing Pulse Width: 0.4 ms
Lead Channel Setting Pacing Pulse Width: 0.5 ms
Lead Channel Setting Sensing Sensitivity: 0.3 mV
Zone Setting Status: 755011

## 2024-10-09 ENCOUNTER — Ambulatory Visit: Payer: Self-pay | Admitting: Cardiology

## 2024-10-10 NOTE — Progress Notes (Signed)
 Remote ICD Transmission

## 2024-10-17 ENCOUNTER — Other Ambulatory Visit (HOSPITAL_COMMUNITY): Payer: Self-pay

## 2024-10-22 ENCOUNTER — Ambulatory Visit: Admitting: Family Medicine

## 2024-10-22 ENCOUNTER — Ambulatory Visit

## 2024-10-22 VITALS — BP 122/60 | HR 83 | Ht 72.0 in | Wt 192.0 lb

## 2024-10-22 DIAGNOSIS — G8929 Other chronic pain: Secondary | ICD-10-CM

## 2024-10-22 DIAGNOSIS — M25461 Effusion, right knee: Secondary | ICD-10-CM | POA: Diagnosis not present

## 2024-10-22 DIAGNOSIS — M1711 Unilateral primary osteoarthritis, right knee: Secondary | ICD-10-CM

## 2024-10-22 DIAGNOSIS — M25561 Pain in right knee: Secondary | ICD-10-CM

## 2024-10-22 NOTE — Progress Notes (Unsigned)
   LILLETTE Ileana Collet, PhD, LAT, ATC acting as a scribe for Artist Lloyd, MD.  Carl Wood is a 83 y.o. adult who presents to [facility] today for R knee pain. Pt was previously seen by Dr. Leonce on 07/17/24 for L hand pain.  Today, pt c/o R knee pain x it has been an issue for about a year. Pt locates pain to the lateral side of the knee. Would like to have the special brace for the right knee like the left knee. Feels like it is going to give out on him. The cloth/fabric brace doesn't really help.  R Knee swelling: none known Mechanical symptoms: Radiates: no Aggravates: all activities Treatments tried: Voltaren  (doesn't work)   Pertinent review of systems: No fevers or chills  Relevant historical information: Knee arthritis   Exam:  BP 122/60   Pulse 83   Ht 6' (1.829 m)   Wt 192 lb (87.1 kg)   SpO2 95%   BMI 26.04 kg/m  General: Well Developed, well nourished, and in no acute distress.   MSK: Right knee 2 to palpation medial joint line. Some laxity MCL stress test. Intact strength.    Lab and Radiology Results X-ray images right knee obtained today personally and independently interpreted. Severe medial knee DJD.  No acute fractures. Await formal radiology review.     Assessment and Plan: 83 y.o. adult with right knee pain due to DJD.  Plan for medial offloader knee brace.  This will help with the instability and pain due to DJD.   PDMP not reviewed this encounter. Orders Placed This Encounter  Procedures   DG Knee AP/LAT W/Sunrise Right    Standing Status:   Future    Number of Occurrences:   1    Expiration Date:   10/22/2025    Reason for Exam (SYMPTOM  OR DIAGNOSIS REQUIRED):   knee pain    Preferred imaging location?:   [place]   No orders of the defined types were placed in this encounter.    Discussed warning signs or symptoms. Please see discharge instructions. Patient expresses  understanding.   The above documentation has been reviewed and is accurate and complete Artist Lloyd, M.D.

## 2024-10-22 NOTE — Patient Instructions (Addendum)
 Thank you for coming in today.   Please get an Xray today before you leave

## 2024-10-23 DIAGNOSIS — D0462 Carcinoma in situ of skin of left upper limb, including shoulder: Secondary | ICD-10-CM | POA: Diagnosis not present

## 2024-10-23 DIAGNOSIS — C44629 Squamous cell carcinoma of skin of left upper limb, including shoulder: Secondary | ICD-10-CM | POA: Diagnosis not present

## 2024-10-26 ENCOUNTER — Ambulatory Visit: Payer: Self-pay | Admitting: Family Medicine

## 2024-10-26 NOTE — Progress Notes (Signed)
 Right knee x-ray shows mild arthritis.

## 2024-11-03 ENCOUNTER — Other Ambulatory Visit: Payer: Self-pay | Admitting: Family Medicine

## 2024-11-03 ENCOUNTER — Other Ambulatory Visit (HOSPITAL_COMMUNITY): Payer: Self-pay | Admitting: Internal Medicine

## 2024-11-11 ENCOUNTER — Other Ambulatory Visit (HOSPITAL_COMMUNITY): Payer: Self-pay | Admitting: Cardiology

## 2024-11-11 DIAGNOSIS — I4821 Permanent atrial fibrillation: Secondary | ICD-10-CM

## 2024-11-12 NOTE — Telephone Encounter (Signed)
 Xarelto  20mg  refill request received. Pt is 83 years old, weight-87.1kg, Crea-0.73 on 06/20/24, last seen by Dr. Cherrie on 06/01/24, Diagnosis-Afib, CrCl- 97.77 mL/min; Dose is appropriate based on dosing criteria. Will send in refill to requested pharmacy.

## 2024-11-19 ENCOUNTER — Other Ambulatory Visit (HOSPITAL_COMMUNITY): Payer: Self-pay

## 2024-11-19 DIAGNOSIS — I5022 Chronic systolic (congestive) heart failure: Secondary | ICD-10-CM

## 2024-11-22 ENCOUNTER — Ambulatory Visit (HOSPITAL_COMMUNITY): Admission: RE | Admit: 2024-11-22 | Discharge: 2024-11-22 | Attending: Internal Medicine | Admitting: Internal Medicine

## 2024-11-22 ENCOUNTER — Encounter (HOSPITAL_COMMUNITY): Payer: Self-pay | Admitting: Internal Medicine

## 2024-11-22 ENCOUNTER — Ambulatory Visit (HOSPITAL_COMMUNITY): Admission: RE | Admit: 2024-11-22

## 2024-11-22 VITALS — BP 110/70 | HR 75 | Wt 194.2 lb

## 2024-11-22 DIAGNOSIS — G4733 Obstructive sleep apnea (adult) (pediatric): Secondary | ICD-10-CM | POA: Diagnosis not present

## 2024-11-22 DIAGNOSIS — E119 Type 2 diabetes mellitus without complications: Secondary | ICD-10-CM | POA: Diagnosis not present

## 2024-11-22 DIAGNOSIS — Z87891 Personal history of nicotine dependence: Secondary | ICD-10-CM | POA: Diagnosis not present

## 2024-11-22 DIAGNOSIS — I081 Rheumatic disorders of both mitral and tricuspid valves: Secondary | ICD-10-CM | POA: Insufficient documentation

## 2024-11-22 DIAGNOSIS — I252 Old myocardial infarction: Secondary | ICD-10-CM | POA: Insufficient documentation

## 2024-11-22 DIAGNOSIS — I253 Aneurysm of heart: Secondary | ICD-10-CM | POA: Diagnosis not present

## 2024-11-22 DIAGNOSIS — I5022 Chronic systolic (congestive) heart failure: Secondary | ICD-10-CM | POA: Diagnosis not present

## 2024-11-22 DIAGNOSIS — M25561 Pain in right knee: Secondary | ICD-10-CM | POA: Insufficient documentation

## 2024-11-22 DIAGNOSIS — Z7901 Long term (current) use of anticoagulants: Secondary | ICD-10-CM | POA: Diagnosis not present

## 2024-11-22 DIAGNOSIS — Z7984 Long term (current) use of oral hypoglycemic drugs: Secondary | ICD-10-CM | POA: Insufficient documentation

## 2024-11-22 DIAGNOSIS — I4821 Permanent atrial fibrillation: Secondary | ICD-10-CM | POA: Insufficient documentation

## 2024-11-22 DIAGNOSIS — Z95810 Presence of automatic (implantable) cardiac defibrillator: Secondary | ICD-10-CM | POA: Diagnosis not present

## 2024-11-22 DIAGNOSIS — I2510 Atherosclerotic heart disease of native coronary artery without angina pectoris: Secondary | ICD-10-CM | POA: Insufficient documentation

## 2024-11-22 DIAGNOSIS — I255 Ischemic cardiomyopathy: Secondary | ICD-10-CM | POA: Insufficient documentation

## 2024-11-22 DIAGNOSIS — M25562 Pain in left knee: Secondary | ICD-10-CM | POA: Insufficient documentation

## 2024-11-22 LAB — ECHOCARDIOGRAM COMPLETE
AR max vel: 3.4 cm2
AV Area VTI: 3.64 cm2
AV Area mean vel: 2.73 cm2
AV Mean grad: 3 mmHg
AV Peak grad: 5 mmHg
Ao pk vel: 1.12 m/s
Area-P 1/2: 2.78 cm2
S' Lateral: 6.3 cm

## 2024-11-22 LAB — BASIC METABOLIC PANEL WITH GFR
Anion gap: 10 (ref 5–15)
BUN: 18 mg/dL (ref 8–23)
CO2: 28 mmol/L (ref 22–32)
Calcium: 10.1 mg/dL (ref 8.9–10.3)
Chloride: 104 mmol/L (ref 98–111)
Creatinine, Ser: 0.93 mg/dL (ref 0.61–1.24)
GFR, Estimated: >60 mL/min (ref >60)
Glucose, Bld: 210 mg/dL — ABNORMAL HIGH (ref 70–99)
Potassium: 4.6 mmol/L (ref 3.5–5.1)
Sodium: 142 mmol/L (ref 135–145)

## 2024-11-22 LAB — PRO BRAIN NATRIURETIC PEPTIDE: Pro Brain Natriuretic Peptide: 687 pg/mL — ABNORMAL HIGH (ref <300.0)

## 2024-11-22 LAB — CBC
HCT: 46.9 % (ref 39.0–52.0)
Hemoglobin: 15.5 g/dL (ref 13.0–17.0)
MCH: 32 pg (ref 26.0–34.0)
MCHC: 33 g/dL (ref 30.0–36.0)
MCV: 96.9 fL (ref 80.0–100.0)
Platelets: 187 K/uL (ref 150–400)
RBC: 4.84 MIL/uL (ref 4.22–5.81)
RDW: 13.5 % (ref 11.5–15.5)
WBC: 6.1 K/uL (ref 4.0–10.5)
nRBC: 0 % (ref 0.0–0.2)

## 2024-11-22 LAB — DIGOXIN LEVEL: Digoxin Level: 0.8 ng/mL (ref 0.8–2.0)

## 2024-11-22 MED ORDER — PERFLUTREN LIPID MICROSPHERE
1.0000 mL | INTRAVENOUS | Status: DC | PRN
  Administered 2024-11-22: 11:00:00 2 mL via INTRAVENOUS

## 2024-11-22 NOTE — Addendum Note (Signed)
 Encounter addended by: Tita Andriette NOVAK, RN on: 11/22/2024 11:59 AM  Actions taken: Pend clinical note, Clinical Note Signed, Order list changed, Diagnosis association updated, Charge Capture section accepted

## 2024-11-22 NOTE — Patient Instructions (Signed)
 Lab Work: Labs done today, your results will be available in MyChart, we will contact you for abnormal readings.  Follow-Up in: 6 months with Dr. Bensimhon PLEASE CALL OUR OFFICE AROUND APRIL/MAY 2026 TO GET SCHEDULED FOR YOUR APPOINTMENT. PHONE NUMBER IS [phone] OPTION 2   At the [facility], you and your health needs are our priority. We have a designated team specialized in the treatment of Heart Failure. This Care Team includes your primary Heart Failure Specialized Cardiologist (physician), Advanced Practice Providers (APPs- Physician Assistants and Nurse Practitioners), and Pharmacist who all work together to provide you with the care you need, when you need it.   You may see any of the following providers on your designated Care Team at your next follow up:  Dr. Toribio Fuel Dr. Ezra Shuck Dr. Odis Brownie Greig Mosses, NP Caffie Shed, [place] [facility] [place], [place] Beckey Coe, NP Jordan Lee, NP Tinnie Redman, PharmD   Please be sure to bring in all your medications bottles to every appointment.   Need to Contact Us :  If you have any questions or concerns before your next appointment please send us  a message through [place] or call our office at [phone].    TO LEAVE A MESSAGE FOR THE NURSE SELECT OPTION 2, PLEASE LEAVE A MESSAGE INCLUDING: YOUR NAME DATE OF BIRTH CALL BACK NUMBER REASON FOR CALL**this is important as we prioritize the call backs  YOU WILL RECEIVE A CALL BACK THE SAME DAY AS LONG AS YOU CALL BEFORE 4:00 PM

## 2024-11-22 NOTE — Progress Notes (Signed)
 [facility] Note    Date:  11/22/2024   ID:  Carl Wood, DOB 1941-04-13, MRN [id number]  Location: Home  Provider location: [address], [place] Type of Visit: Established patient PCP:  Carl Garnette KIDD, Wood  Cardiologist:Carl Wood  Primary HF: Carl Wood  EP: Carl Wood   History of Present Illness:  Carl Wood is an 83 y/o male with CAD s/p MI 1983, ICM with chronic systolic heart failure s/p Medtronic single chamber ICD (EF 20%) CVA 2014, DM2 VT, permanent A fib on Xarelto , SDH 2019, OSA, and former smoker.   Had CRT-D upgrade in April 2018.    In 12/18 saw Carl. Skeet for chronic HA. Found to have small SDHs that were felt to be chronic after a previous fall when trimming vines. Xarelto  stopped. F/u C 2/19 SDHs resolved. Xarelto  restarted in 2/19  Echo 1/21   EF 20-25%  Echo 01/13/22 EF 25-30% RV ok   ABIs 2017 No PAD  Echo  07/25/23 limited windows. EF ~25-30%   Here with his wife for f/u.Says he is doing about the same. Feels pretty good. Main issue is bilateral knee pain SBP runs in 90s. No dizziness/orhtostasis. No edema, orthopnea or PND.   ICD interrogation: No VT. Volume up slightly but below threshold. 100% biv pacing AL 1.2hr/day Personally reviewed  Echo today 11/22/24 EF ~25% RV ok Personally reviewed   Past Medical History:  Diagnosis Date   CAD (coronary artery disease)    CHF (congestive heart failure) (HCC)    Chronic atrial fibrillation (HCC)    Colon polyps    Diabetes mellitus    Diverticulosis of colon    DIVERTICULOSIS, COLON 10/16/2006   Qualifier: Diagnosis of  By: Carl Wood     Excessive daytime sleepiness 02/19/2016   Hyperlipidemia    Hypertension    MI (mitral incompetence)    Nephrolithiasis    NEPHROLITHIASIS 06/26/2008   Qualifier: Diagnosis of  By: Carl Wood     OSA (obstructive sleep apnea) 02/19/2016   Moderate to severe OSA with an AHI of 25/hr and on CPAP at 8cm H2O   PE  (pulmonary embolism)    V-tach Carl Wood)    Past Surgical History:  Procedure Laterality Date   BACK SURGERY     BASAL CELL CARCINOMA EXCISION     nose   BIV UPGRADE N/A 03/28/2017   Procedure: BiV Upgrade;  Surgeon: Carl Wood;  Location: [facility] INVASIVE CV LAB;  Service: Cardiovascular;  Laterality: N/A;   CARDIAC CATHETERIZATION N/A 01/20/2016   Procedure: Right/Left Heart Cath and Coronary Angiography;  Surgeon: Carl Wood;  [facility];  Service: Cardiovascular;  Laterality: N/A;   CARDIAC DEFIBRILLATOR PLACEMENT     medtronic virtuoso   CHOLECYSTECTOMY     COLONOSCOPY  12/11/2012   Procedure: COLONOSCOPY;  Surgeon: Carl Wood;  Location: [facility];  Service: Endoscopy;  Laterality: N/A;   ICD GENERATOR CHANGEOUT N/A 01/04/2024   Procedure: ICD GENERATOR CHANGEOUT;  Surgeon: Carl Wood;  Location: [facility] INVASIVE CV LAB;  Service: Cardiovascular;  Laterality: N/A;   KNEE ARTHROPLASTY       Current Outpatient Medications  Medication Sig Dispense Refill   ACCU-CHEK GUIDE TEST test strip CHECK BLOOD SUGAR UP TO FOUR TIMES DAILY 300 each 0   ascorbic acid (VITAMIN C) 500 MG tablet Take 500 mg by mouth daily.  blood glucose meter kit and supplies KIT Dispense based on patient and insurance preference. Use up to four times daily as directed. 1 each 0   cetirizine (ZYRTEC) 10 MG tablet Take 10 mg by mouth daily.     digoxin  (LANOXIN ) 0.25 MG tablet Take 1/2 (one-half) tablet by mouth once daily 45 tablet 0   ezetimibe  (ZETIA ) 10 MG tablet Take 1 tablet (10 mg total) by mouth daily. 90 tablet 3   furosemide  (LASIX ) 20 MG tablet Take 1 tablet by mouth once a week 12 tablet 0   glimepiride  (AMARYL ) 4 MG tablet TAKE 2 TABLETS BY MOUTH ONCE DAILY WITH BREAKFAST 180 tablet 0   JARDIANCE  10 MG TABS tablet Take 1 tablet by mouth once daily 90 tablet 0   metFORMIN  (GLUCOPHAGE -XR) 500 MG 24 hr tablet TAKE 2 TABLETS BY MOUTH IN THE MORNING AND 2 AT  BEDTIME 360 tablet 0   metoprolol  succinate (TOPROL -XL) 25 MG 24 hr tablet TAKE 1 TABLET BY MOUTH AT BEDTIME 90 tablet 0   rivaroxaban  (XARELTO ) 20 MG TABS tablet Take 1 tablet by mouth once daily 90 tablet 1   rosuvastatin  (CRESTOR ) 40 MG tablet Take 1 tablet by mouth once daily 90 tablet 0   sacubitril -valsartan  (ENTRESTO ) 24-26 MG Take 1 tablet by mouth twice daily (Patient taking differently: Take 0.5 tablets by mouth 2 (two) times daily.) 180 tablet 2   TURMERIC PO Take 1,400 mg by mouth 2 (two) times daily.     No current facility-administered medications for this encounter.   Facility-Administered Medications Ordered in Other Encounters  Medication Dose Route Frequency Provider Last Rate Last Admin   perflutren  lipid microspheres (DEFINITY ) IV suspension  1-10 mL Intravenous PRN Carl Wood   2 mL at 11/22/24 1037    Allergies:   Penicillins and Sulfamethoxazole   Social History:  The patient  reports that he quit smoking about 11 years ago. His smoking use included cigarettes. He started smoking about 61 years ago. He has a 50 pack-year smoking history. He has never used smokeless tobacco. He reports that he does not drink alcohol and does not use drugs.   Family History:  The patient's family history includes Bladder Cancer in his mother; Colon cancer in his father; Diabetes in his father; Heart attack (age of onset: 64) in his child; Heart attack (age of onset: 45) in his child; Heart disease in his father and mother.   ROS:  Please see the history of present illness.   All other systems are personally reviewed and negative.   Vitals:   11/22/24 1111  BP: 110/70  Pulse: 75  SpO2: 95%    Wt Readings from Last 3 Encounters:  11/22/24 88.1 kg (194 lb 3.2 oz)  10/22/24 87.1 kg (192 lb)  09/04/24 85.5 kg (188 lb 9.6 oz)    Exam:  General:  Sitting up. Elderly male. No resp difficulty HEENT: normal Neck: supple. no JVD.  Cor: Regular rate & rhythm. No rubs,  gallops or murmurs. Lungs: clear Abdomen: soft, nontender, nondistended.Good bowel sounds. Extremities: no cyanosis, clubbing, rash, edema Neuro: alert & orientedx3, cranial nerves grossly intact. moves all 4 extremities w/o difficulty. Affect pleasant   ECG Underlying AF. LV paced 85 + PVCs. Personally reviewed   Recent Labs: 06/01/2024: B Natriuretic Peptide 145.7 06/20/2024: ALT 20; BUN 19; Creatinine, Ser 0.73; Hemoglobin 15.2; Platelets 174.0; Potassium 4.2; Sodium 141   Wt Readings from Last 3 Encounters:  11/22/24 88.1 kg (194 lb 3.2  oz)  10/22/24 87.1 kg (192 lb)  09/04/24 85.5 kg (188 lb 9.6 oz)    ASSESSMENT AND PLAN:  1. Chronic Systolic HF: ICM, s/p Medtronic Bi-V ICD (upgrade 4/18). Echo 09/2016 EF 25-30%. ECHO 8/19 EF 20% - Echo 12/12/19 EF 20-25% RV ok  - Echo today 11/22/24 EF ~25% RV ok Personally reviewed - Stable NYHA III - Volume up slightly on ICD. Currently taking lasix  1x/week can take extra as needed. We discussed this.  - BP continues to run low but asymptoamtic - Echo  01/13/22 EF stable 25-30% RV ok - Echo 07/25/23 EF 25-30%  - Continue Entresto  12/13 bid BP runs low but tolerable so will continue for now  - Continue Jardiance  10  - Continue Toprol  25 at bedtime - Continue digoxin  0.125 - No spiro with history of hyperkalemia and soft BP.  - s/p ICD generator change 1/25. Saw Carl. Cindie on 05/04/24.  - ICD interrogated today in clinic - Labs today   2. OSA - recently saw Carl. Shlomo and wasn't using device so new device ordered   3. Chronic A fib - Rate controlled. - Xarelto  restarted after resolution of SDH.  - No recent bleeding   4. CAD: - Has known distal LAD lesion 95% and mid LAD 40%. No benefit to revascularization given wall motion abnormality.  - No s/s angina - Continue statin. Off ASA with Xarelto   5. NSVT  - stable - continue b-blocker - No VT on ICD  Signed, Carl Fuel, Wood  11:50 AM  [facility] [address] [facility] [place] [address] [phone] (office) [phone] (fax)

## 2024-11-25 ENCOUNTER — Other Ambulatory Visit: Payer: Self-pay | Admitting: Family Medicine

## 2024-12-03 ENCOUNTER — Telehealth: Admitting: Cardiology

## 2024-12-07 ENCOUNTER — Ambulatory Visit: Admitting: Cardiology

## 2024-12-09 ENCOUNTER — Other Ambulatory Visit: Payer: Self-pay | Admitting: Cardiology

## 2024-12-09 ENCOUNTER — Other Ambulatory Visit: Payer: Self-pay | Admitting: Family Medicine

## 2024-12-10 ENCOUNTER — Ambulatory Visit: Payer: Self-pay | Admitting: Family Medicine

## 2024-12-10 ENCOUNTER — Ambulatory Visit (INDEPENDENT_AMBULATORY_CARE_PROVIDER_SITE_OTHER): Admitting: Family Medicine

## 2024-12-10 ENCOUNTER — Encounter: Payer: Self-pay | Admitting: Family Medicine

## 2024-12-10 VITALS — BP 110/72 | HR 85 | Temp 98.4°F | Ht 72.0 in | Wt 189.0 lb

## 2024-12-10 DIAGNOSIS — B9689 Other specified bacterial agents as the cause of diseases classified elsewhere: Secondary | ICD-10-CM | POA: Diagnosis not present

## 2024-12-10 DIAGNOSIS — I5022 Chronic systolic (congestive) heart failure: Secondary | ICD-10-CM

## 2024-12-10 DIAGNOSIS — I4821 Permanent atrial fibrillation: Secondary | ICD-10-CM

## 2024-12-10 DIAGNOSIS — Z7984 Long term (current) use of oral hypoglycemic drugs: Secondary | ICD-10-CM

## 2024-12-10 DIAGNOSIS — E785 Hyperlipidemia, unspecified: Secondary | ICD-10-CM

## 2024-12-10 DIAGNOSIS — J449 Chronic obstructive pulmonary disease, unspecified: Secondary | ICD-10-CM | POA: Diagnosis not present

## 2024-12-10 DIAGNOSIS — J329 Chronic sinusitis, unspecified: Secondary | ICD-10-CM | POA: Diagnosis not present

## 2024-12-10 DIAGNOSIS — E1169 Type 2 diabetes mellitus with other specified complication: Secondary | ICD-10-CM | POA: Diagnosis not present

## 2024-12-10 LAB — HEMOGLOBIN A1C: Hgb A1c MFr Bld: 8.4 % — ABNORMAL HIGH (ref 4.6–6.5)

## 2024-12-10 LAB — COMPREHENSIVE METABOLIC PANEL WITH GFR
ALT: 23 U/L (ref 3–53)
AST: 25 U/L (ref 5–37)
Albumin: 4.2 g/dL (ref 3.5–5.2)
Alkaline Phosphatase: 61 U/L (ref 39–117)
BUN: 14 mg/dL (ref 6–23)
CO2: 29 meq/L (ref 19–32)
Calcium: 9.5 mg/dL (ref 8.4–10.5)
Chloride: 104 meq/L (ref 96–112)
Creatinine, Ser: 0.8 mg/dL (ref 0.40–1.50)
GFR: 83.14 mL/min
Glucose, Bld: 167 mg/dL — ABNORMAL HIGH (ref 70–99)
Potassium: 4.7 meq/L (ref 3.5–5.1)
Sodium: 141 meq/L (ref 135–145)
Total Bilirubin: 0.9 mg/dL (ref 0.2–1.2)
Total Protein: 7.2 g/dL (ref 6.0–8.3)

## 2024-12-10 MED ORDER — DOXYCYCLINE HYCLATE 100 MG PO TABS: 100.0000 mg | ORAL_TABLET | Freq: Two times a day (BID) | ORAL | 0 refills | Status: AC

## 2024-12-10 NOTE — Progress Notes (Signed)
 " Phone [phone] In person visit   Subjective:   Carl Wood is a 84 y.o. year old very pleasant adult patient who presents for/with See problem oriented charting Chief Complaint  Patient presents with   Medical Management of Chronic Issues    6 month follow up; pt states he has a sinus infection and has been feeling bad x8 days, stopped up nose, headache, sinus pressure, fever 99-101; declines flu and covid swab;     Past Medical History-  Patient Active Problem List   Diagnosis Date Noted   Permanent atrial fibrillation (HCC) 05/10/2023    Priority: High   COPD (chronic obstructive pulmonary disease) (HCC) 12/26/2018    Priority: High   History of subdural hematoma 11/21/2017    Priority: High   Chronic systolic heart failure (HCC) 08/06/2015    Priority: High   Former smoker 11/15/2014    Priority: High   History of cardioembolic cerebrovascular accident (CVA) 12/19/2012    Priority: High    ventricular tachycardia-non sustained  02/17/2012    Priority: High   Diabetes mellitus type II, controlled (HCC) 10/16/2006    Priority: High   CAD (coronary artery disease) 10/16/2006    Priority: High   Chronic pulmonary embolism (HCC) 10/16/2006    Priority: High   Dyspnea on exertion 08/22/2017    Priority: Medium    OSA (obstructive sleep apnea) 02/19/2016    Priority: Medium    Carotid artery stenosis 03/17/2015    Priority: Medium    Implantable cardioverter-defibrillator (ICD) in situ 02/17/2012    Priority: Medium    History of skin cancer 07/05/2007    Priority: Medium    Hyperlipidemia associated with type 2 diabetes mellitus (HCC) 10/16/2006    Priority: Medium    Essential hypertension 10/16/2006    Priority: Medium    Osteoarthritis of right knee 09/22/2018    Priority: Low   Frontal headache 10/06/2017    Priority: Low   Rhinitis 08/22/2017    Priority: Low   LBBB (left bundle branch block) 08/06/2015    Priority: Low   Actinic keratosis  11/15/2014    Priority: Low   Benign neoplasm of colon 12/11/2012    Priority: Low   IVCD (intraventricular conduction defect) 05/10/2023   Ischemic cardiomyopathy 03/28/2017    Medications- reviewed and updated Current Outpatient Medications  Medication Sig Dispense Refill   ACCU-CHEK GUIDE TEST test strip CHECK BLOOD SUGAR UP TO FOUR TIMES DAILY 300 each 0   ascorbic acid (VITAMIN C) 500 MG tablet Take 500 mg by mouth daily.     blood glucose meter kit and supplies KIT Dispense based on patient and insurance preference. Use up to four times daily as directed. 1 each 0   cetirizine (ZYRTEC) 10 MG tablet Take 10 mg by mouth daily.     digoxin  (LANOXIN ) 0.25 MG tablet Take 1/2 (one-half) tablet by mouth once daily 45 tablet 0   doxycycline  (VIBRA -TABS) 100 MG tablet Take 1 tablet (100 mg total) by mouth 2 (two) times daily for 7 days. 14 tablet 0   ezetimibe  (ZETIA ) 10 MG tablet Take 1 tablet by mouth once daily 90 tablet 0   furosemide  (LASIX ) 20 MG tablet Take 1 tablet by mouth once a week 12 tablet 0   glimepiride  (AMARYL ) 4 MG tablet TAKE 2 TABLETS BY MOUTH ONCE DAILY WITH BREAKFAST 180 tablet 0   JARDIANCE  10 MG TABS tablet Take 1 tablet by mouth once daily 90 tablet 0  metFORMIN  (GLUCOPHAGE -XR) 500 MG 24 hr tablet TAKE 2 TABLETS BY MOUTH IN THE MORNING AND 2 AT BEDTIME 360 tablet 0   metoprolol  succinate (TOPROL -XL) 25 MG 24 hr tablet TAKE 1 TABLET BY MOUTH AT BEDTIME 90 tablet 0   rivaroxaban  (XARELTO ) 20 MG TABS tablet Take 1 tablet by mouth once daily 90 tablet 1   rosuvastatin  (CRESTOR ) 40 MG tablet Take 1 tablet by mouth once daily 90 tablet 0   sacubitril -valsartan  (ENTRESTO ) 24-26 MG Take 1 tablet by mouth twice daily (Patient taking differently: Take 0.5 tablets by mouth 2 (two) times daily.) 180 tablet 2   TURMERIC PO Take 1,400 mg by mouth 2 (two) times daily.     No current facility-administered medications for this visit.     Objective:  BP 110/72 (BP Location: Left  Arm, Patient Position: Sitting, Cuff Size: Normal)   Pulse 85   Temp 98.4 F (36.9 C) (Temporal)   Ht 6' (1.829 m)   Wt 189 lb (85.7 kg)   SpO2 95%   BMI 25.63 kg/m  Gen: NAD, resting comfortably  HEENT: Turbinates erythematous with yellow drainage, TM normal, pharynx mildly erythematous with no tonsilar exudate or edema, frontal> maxillary sinus tenderness CV: RRR no murmurs rubs or gallops Lungs: CTAB no crackles, wheeze, rhonchi Ext: no edema Skin: warm, dry  Diabetic foot exam was performed with the following findings:   No deformities, ulcerations, or other skin breakdown Normal sensation of 10g monofilament Intact posterior tibialis and dorsalis pedis pulses        Assessment and Plan    # Concern for sinusitis S: Patient has been feeling rundown since Sunday the 28th of december.  Sinus congestion, sinus headaches and pressure.  Temperature as high as 101 at home but afebrile here today- that portoin has improved. No improvement at all in last few days. No wheezing or increased shortness of breath but does have a lot of cough from drainage A/P: LIkely bacterial Sinus infection/Sinusitis- is only day 9 but no improvement so we sent in doxycycline  antibiotic for him to take starting tomorrow morning if not turning the corner  Finally, we reviewed reasons to return to care including if symptoms worsen or persist  (despite above treatments) or new concerns arise (particularly fever or shortness of breath)     # Right knee pain-offloader brace for medial side started by Dr. Joane in November-patient reports helpful    #Chronic systolic heart failure-follows with Dr. Cherrie #ICD in place with history of ventricular tachycardia S: Medication: Metoprolol  25 mg extended release, digoxin  0.125mg , Lasix  20 mg-once a week, Entresto  24-26 mg BID.  Also on Jardiance  10 mg for diabetes/heart failure -ICD monitored by cardiology - no increase in weight or swelling A/P: CHF  euvolemic- continue current medications - no ICD firings    # Atrial fibrillation chronic/ history of cardioemolic CVA/history subdural hematoma S: Compliant with metoprolol  25 mg XR for rate control and Xarelto  20 mg for anticoagulation. -Does have history of subdural hematoma-Xarelto  was held short-term but was restarted once resolved -History of cardioembolic strokes we prefer to have him on Xarelto  as long as no falls A/P: appropriately anticoagulated and rate controlled- continue current medicine    #CAD/hyperlipidemia/carotid artery stenosis S: History of MI in 1983.  Denies history of stents or bypass-streptokinase was used in the past per patient. -Due to bleeding risk is on Xarelto  alone and not on aspirin  -Patient compliant with rosuvastatin  40 mg daily, Zetia  10 mg daily - no  chest pain or shortness of breath  outside of stairs- and no chest pain  Lab Results  Component Value Date   CHOL 104 06/20/2024   HDL 39.90 06/20/2024   LDLCALC 49 06/20/2024   LDLDIRECT 81.0 12/08/2023   TRIG 75.0 06/20/2024   CHOLHDL 3 06/20/2024  A/P: coronary artery disease asymptomatic (with stable shortness of breath with stairs per baseline) continue current medications   -cholesterol looked great in July- continue current medications  -Carotid artery stenosis in 2015.  Only 1 to 39% stenosis-stable 2022- repeat 2 years stable 02/01/23- may get repeated with cardiology  #COPD S: no rx -symbicort  did not make a big difference in breathing A/P: overall stable with no medicine- continue to monitor    # Diabetes S: Compliant with metformin  1000mg  twice daily ER , jardiance  10mg  (q2 hour urination so wants to avoid increase ), amaryl  8 mg CBGs- 140-150 each morning- rarely higher. Did have one 80 so hesitate to increase doses.  Exercise and diet- hard with knees- some wlaking Lab Results  Component Value Date   HGBA1C 8.2 (H) 06/20/2024   HGBA1C 7.9 (H) 12/08/2023   HGBA1C 7.1 (H) 06/07/2023   A/P: hopefully improved (we didn't make adjustments last visit with 8.2 but focused on lifestyle)- update a1c today. Continue current meds for now   #Hypertension S: Compliant with metoprolol  25 mg extended release, Entresto  24-26 mg daily, Lasix  20 mg weekly .   BP Readings from Last 3 Encounters:  12/10/24 110/72  11/22/24 110/70  10/22/24 122/60   A/P: well controlled continue current medications    #OSA-compliant with CPAP  #Former smoker-enrolled in lung cancer screening program until no longer qualified at 77 -we continue scans each June/July  for nodule follow up - next visit order   Recommended follow up: Return in about 6 months (around 06/09/2025) for physical or sooner if needed.Schedule b4 you leave. Future Appointments  Date Time Provider Department Center  01/03/2025  7:00 AM [facility] H[facility] DEVICE REMOTES [facility]  04/04/2025  7:00 AM [facility] DEVICE REMOTES [facility]  07/04/2025  7:00 AM [facility] DEVICE REMOTES [facility]  10/03/2025  7:00 AM [facility] DEVICE REMOTES [facility]  01/02/2026  7:00 AM [facility] DEVICE REMOTES [facility]    Lab/Order associations:   ICD-10-CM   1. Bacterial sinusitis  J32.9    B96.89     2. Chronic systolic heart failure (HCC)  P49.77     3. Chronic obstructive pulmonary disease, unspecified COPD type (HCC)  J44.9     4. Permanent atrial fibrillation (HCC)  I48.21     5. Hyperlipidemia associated with type 2 diabetes mellitus (HCC)  E11.69 Comprehensive metabolic panel with GFR   E78.5 Hemoglobin A1c      Meds ordered this encounter  Medications   doxycycline  (VIBRA -TABS) 100 MG tablet    Sig: Take 1 tablet (100 mg total) by mouth 2 (two) times daily for 7 days.    Dispense:  14 tablet    Refill:  0    Return precautions advised.  Garnette Lukes, MD  "

## 2024-12-10 NOTE — Patient Instructions (Addendum)
 LIkely bacterial Sinus infection/Sinusitis- is only day 9 but no improvement so we sent in doxycycline  antibiotic for him to take starting tomorrow morning if not turning the corner  Finally, we reviewed reasons to return to care including if symptoms worsen or persist  (despite above treatments) or new concerns arise (particularly fever or shortness of breath)  Please stop by lab before you go If you have mychart- we will send your results within 3 business days of us  receiving them.  If you do not have mychart- we will call you about results within 5 business days of us  receiving them.  *please also note that you will see labs on mychart as soon as they post. I will later go in and write notes on them- will say notes from Carl Wood   Recommended follow up: Return in about 6 months (around 06/09/2025) for physical or sooner if needed.Schedule b4 you leave.

## 2024-12-19 ENCOUNTER — Other Ambulatory Visit: Payer: Self-pay | Admitting: Family Medicine

## 2024-12-19 DIAGNOSIS — E1169 Type 2 diabetes mellitus with other specified complication: Secondary | ICD-10-CM

## 2024-12-19 DIAGNOSIS — E119 Type 2 diabetes mellitus without complications: Secondary | ICD-10-CM

## 2024-12-19 NOTE — Telephone Encounter (Signed)
 Referral sent

## 2024-12-20 ENCOUNTER — Encounter: Payer: Self-pay | Admitting: Internal Medicine

## 2024-12-23 ENCOUNTER — Other Ambulatory Visit: Payer: Self-pay | Admitting: Family Medicine

## 2024-12-29 ENCOUNTER — Other Ambulatory Visit: Payer: Self-pay | Admitting: Family Medicine

## 2024-12-31 ENCOUNTER — Telehealth (HOSPITAL_COMMUNITY): Payer: Self-pay

## 2024-12-31 NOTE — Telephone Encounter (Signed)
 [facility] Patient Advocate Encounter  The patient was renewed for a [employer] grant that will help cover the cost of Digoxin , Entresto , Jardiance , Metoprolol , Xarelto .  Total amount awarded, $7,500.  Effective: 12/01/2024 - 11/30/2025.  BIN [id number] PCN [id number] [id number] ID [id number]  Pharmacy provided with approval and processing information. Patient informed via MyChart.  Rachel DEL, CPhT Rx Patient Advocate Phone: [phone]

## 2025-01-03 ENCOUNTER — Ambulatory Visit: Payer: Self-pay | Admitting: Cardiology

## 2025-01-03 DIAGNOSIS — I472 Ventricular tachycardia, unspecified: Secondary | ICD-10-CM

## 2025-01-03 LAB — CUP PACEART REMOTE DEVICE CHECK
Battery Remaining Longevity: 82 mo
Battery Voltage: 2.99 V
Brady Statistic RV Percent Paced: 90.44 %
Date Time Interrogation Session: 20260129044042
HighPow Impedance: 71 Ohm
Implantable Lead Connection Status: 753985
Implantable Lead Connection Status: 753985
Implantable Lead Implant Date: 20010723
Implantable Lead Implant Date: 20180423
Implantable Lead Location: 753858
Implantable Lead Location: 753860
Implantable Lead Model: 6943
Implantable Pulse Generator Implant Date: 20250129
Lead Channel Impedance Value: 285 Ohm
Lead Channel Impedance Value: 3000 Ohm
Lead Channel Impedance Value: 418 Ohm
Lead Channel Impedance Value: 456 Ohm
Lead Channel Impedance Value: 456 Ohm
Lead Channel Impedance Value: 494 Ohm
Lead Channel Impedance Value: 513 Ohm
Lead Channel Impedance Value: 855 Ohm
Lead Channel Impedance Value: 874 Ohm
Lead Channel Impedance Value: 893 Ohm
Lead Channel Impedance Value: 912 Ohm
Lead Channel Impedance Value: 912 Ohm
Lead Channel Impedance Value: 969 Ohm
Lead Channel Pacing Threshold Amplitude: 1.375 V
Lead Channel Pacing Threshold Amplitude: 1.5 V
Lead Channel Pacing Threshold Pulse Width: 0.4 ms
Lead Channel Pacing Threshold Pulse Width: 0.5 ms
Lead Channel Sensing Intrinsic Amplitude: 12.8 mV
Lead Channel Setting Pacing Amplitude: 2 V
Lead Channel Setting Pacing Amplitude: 3 V
Lead Channel Setting Pacing Pulse Width: 0.4 ms
Lead Channel Setting Pacing Pulse Width: 0.5 ms
Lead Channel Setting Sensing Sensitivity: 0.3 mV
Zone Setting Status: 755011

## 2025-01-10 NOTE — Progress Notes (Signed)
 Remote ICD Transmission

## 2025-06-10 ENCOUNTER — Encounter: Admitting: Family Medicine
